# Patient Record
Sex: Female | Born: 1965 | Race: White | Hispanic: No | Marital: Married | Smoking: Former smoker
Health system: Southern US, Community
[De-identification: ages and names within clinical notes are randomized; demographics above are authoritative.]

## PROBLEM LIST (undated history)

## (undated) DIAGNOSIS — G8929 Other chronic pain: Secondary | ICD-10-CM

## (undated) DIAGNOSIS — I499 Cardiac arrhythmia, unspecified: Secondary | ICD-10-CM

## (undated) DIAGNOSIS — M1990 Unspecified osteoarthritis, unspecified site: Secondary | ICD-10-CM

## (undated) DIAGNOSIS — Z8489 Family history of other specified conditions: Secondary | ICD-10-CM

## (undated) DIAGNOSIS — F329 Major depressive disorder, single episode, unspecified: Secondary | ICD-10-CM

## (undated) DIAGNOSIS — I428 Other cardiomyopathies: Secondary | ICD-10-CM

## (undated) DIAGNOSIS — M549 Dorsalgia, unspecified: Secondary | ICD-10-CM

## (undated) DIAGNOSIS — E785 Hyperlipidemia, unspecified: Secondary | ICD-10-CM

## (undated) DIAGNOSIS — M542 Cervicalgia: Secondary | ICD-10-CM

## (undated) DIAGNOSIS — C439 Malignant melanoma of skin, unspecified: Secondary | ICD-10-CM

## (undated) DIAGNOSIS — M7750 Other enthesopathy of unspecified foot: Secondary | ICD-10-CM

## (undated) DIAGNOSIS — J302 Other seasonal allergic rhinitis: Secondary | ICD-10-CM

## (undated) DIAGNOSIS — F32A Depression, unspecified: Secondary | ICD-10-CM

## (undated) DIAGNOSIS — F419 Anxiety disorder, unspecified: Secondary | ICD-10-CM

## (undated) DIAGNOSIS — M797 Fibromyalgia: Secondary | ICD-10-CM

## (undated) DIAGNOSIS — I10 Essential (primary) hypertension: Secondary | ICD-10-CM

## (undated) DIAGNOSIS — D649 Anemia, unspecified: Secondary | ICD-10-CM

## (undated) DIAGNOSIS — N200 Calculus of kidney: Secondary | ICD-10-CM

## (undated) DIAGNOSIS — Z87442 Personal history of urinary calculi: Secondary | ICD-10-CM

## (undated) DIAGNOSIS — M722 Plantar fascial fibromatosis: Secondary | ICD-10-CM

## (undated) DIAGNOSIS — D472 Monoclonal gammopathy: Secondary | ICD-10-CM

## (undated) HISTORY — PX: DILATION AND CURETTAGE OF UTERUS: SHX78

## (undated) HISTORY — PX: WISDOM TOOTH EXTRACTION: SHX21

## (undated) HISTORY — DX: Plantar fascial fibromatosis: M72.2

## (undated) HISTORY — DX: Essential (primary) hypertension: I10

## (undated) HISTORY — DX: Malignant melanoma of skin, unspecified: C43.9

## (undated) HISTORY — DX: Anxiety disorder, unspecified: F41.9

## (undated) HISTORY — PX: TONSILLECTOMY: SUR1361

## (undated) HISTORY — DX: Calculus of kidney: N20.0

## (undated) HISTORY — DX: Other enthesopathy of unspecified foot and ankle: M77.50

## (undated) HISTORY — PX: COLONOSCOPY: SHX174

## (undated) HISTORY — PX: EYE SURGERY: SHX253

## (undated) HISTORY — DX: Cervicalgia: M54.2

## (undated) HISTORY — PX: ABDOMINAL HYSTERECTOMY: SHX81

## (undated) HISTORY — PX: NECK SURGERY: SHX720

## (undated) HISTORY — DX: Dorsalgia, unspecified: M54.9

## (undated) HISTORY — PX: RHINOPLASTY: SUR1284

## (undated) HISTORY — PX: TOTAL VAGINAL HYSTERECTOMY: SHX2548

## (undated) HISTORY — DX: Other chronic pain: G89.29

## (undated) HISTORY — DX: Other cardiomyopathies: I42.8

## (undated) HISTORY — PX: BACK SURGERY: SHX140

## (undated) HISTORY — PX: LITHOTRIPSY: SUR834

---

## 1998-09-24 ENCOUNTER — Other Ambulatory Visit: Admission: RE | Admit: 1998-09-24 | Discharge: 1998-09-24 | Payer: Self-pay | Admitting: Internal Medicine

## 1998-12-15 ENCOUNTER — Encounter: Admission: RE | Admit: 1998-12-15 | Discharge: 1999-02-03 | Payer: Self-pay | Admitting: Neurology

## 2000-08-31 HISTORY — PX: ENDOMETRIAL ABLATION: SHX621

## 2001-09-19 ENCOUNTER — Encounter: Admission: RE | Admit: 2001-09-19 | Discharge: 2001-09-19 | Payer: Self-pay | Admitting: Internal Medicine

## 2001-09-19 ENCOUNTER — Encounter: Payer: Self-pay | Admitting: Internal Medicine

## 2002-05-19 ENCOUNTER — Encounter (INDEPENDENT_AMBULATORY_CARE_PROVIDER_SITE_OTHER): Payer: Self-pay | Admitting: Specialist

## 2002-05-19 ENCOUNTER — Ambulatory Visit (HOSPITAL_COMMUNITY): Admission: RE | Admit: 2002-05-19 | Discharge: 2002-05-19 | Payer: Self-pay | Admitting: Obstetrics and Gynecology

## 2004-01-18 ENCOUNTER — Emergency Department (HOSPITAL_COMMUNITY): Admission: EM | Admit: 2004-01-18 | Discharge: 2004-01-18 | Payer: Self-pay | Admitting: Emergency Medicine

## 2004-07-19 ENCOUNTER — Other Ambulatory Visit: Admission: RE | Admit: 2004-07-19 | Discharge: 2004-07-19 | Payer: Self-pay | Admitting: Internal Medicine

## 2006-03-10 ENCOUNTER — Emergency Department (HOSPITAL_COMMUNITY): Admission: EM | Admit: 2006-03-10 | Discharge: 2006-03-10 | Payer: Self-pay | Admitting: Family Medicine

## 2007-01-28 ENCOUNTER — Other Ambulatory Visit: Admission: RE | Admit: 2007-01-28 | Discharge: 2007-01-28 | Payer: Self-pay | Admitting: Internal Medicine

## 2007-02-04 ENCOUNTER — Encounter: Admission: RE | Admit: 2007-02-04 | Discharge: 2007-02-04 | Payer: Self-pay | Admitting: Internal Medicine

## 2007-05-04 ENCOUNTER — Emergency Department (HOSPITAL_COMMUNITY): Admission: EM | Admit: 2007-05-04 | Discharge: 2007-05-04 | Payer: Self-pay | Admitting: Emergency Medicine

## 2007-11-10 ENCOUNTER — Encounter: Admission: RE | Admit: 2007-11-10 | Discharge: 2007-11-10 | Payer: Self-pay | Admitting: Internal Medicine

## 2007-12-16 ENCOUNTER — Other Ambulatory Visit: Admission: RE | Admit: 2007-12-16 | Discharge: 2007-12-16 | Payer: Self-pay | Admitting: Obstetrics and Gynecology

## 2007-12-20 ENCOUNTER — Ambulatory Visit (HOSPITAL_COMMUNITY): Admission: RE | Admit: 2007-12-20 | Discharge: 2007-12-21 | Payer: Self-pay | Admitting: Neurosurgery

## 2008-02-14 ENCOUNTER — Encounter: Admission: RE | Admit: 2008-02-14 | Discharge: 2008-02-14 | Payer: Self-pay | Admitting: Internal Medicine

## 2008-07-26 ENCOUNTER — Encounter: Admission: RE | Admit: 2008-07-26 | Discharge: 2008-07-26 | Payer: Self-pay | Admitting: Neurosurgery

## 2008-11-27 ENCOUNTER — Ambulatory Visit (HOSPITAL_COMMUNITY): Admission: RE | Admit: 2008-11-27 | Discharge: 2008-11-28 | Payer: Self-pay | Admitting: Neurosurgery

## 2009-02-16 ENCOUNTER — Encounter: Admission: RE | Admit: 2009-02-16 | Discharge: 2009-02-16 | Payer: Self-pay | Admitting: Internal Medicine

## 2009-06-11 ENCOUNTER — Encounter: Admission: RE | Admit: 2009-06-11 | Discharge: 2009-06-11 | Payer: Self-pay | Admitting: Neurosurgery

## 2009-07-03 HISTORY — PX: KIDNEY STONE SURGERY: SHX686

## 2009-09-16 ENCOUNTER — Encounter: Admission: RE | Admit: 2009-09-16 | Discharge: 2009-09-16 | Payer: Self-pay | Admitting: Neurosurgery

## 2009-09-17 ENCOUNTER — Ambulatory Visit (HOSPITAL_COMMUNITY): Admission: AD | Admit: 2009-09-17 | Discharge: 2009-09-17 | Payer: Self-pay | Admitting: Urology

## 2009-09-23 ENCOUNTER — Ambulatory Visit (HOSPITAL_COMMUNITY): Admission: RE | Admit: 2009-09-23 | Discharge: 2009-09-23 | Payer: Self-pay | Admitting: Urology

## 2009-09-30 ENCOUNTER — Ambulatory Visit (HOSPITAL_BASED_OUTPATIENT_CLINIC_OR_DEPARTMENT_OTHER): Admission: RE | Admit: 2009-09-30 | Discharge: 2009-09-30 | Payer: Self-pay | Admitting: Urology

## 2009-12-16 ENCOUNTER — Inpatient Hospital Stay (HOSPITAL_COMMUNITY): Admission: RE | Admit: 2009-12-16 | Discharge: 2009-12-20 | Payer: Self-pay | Admitting: Neurosurgery

## 2010-03-22 ENCOUNTER — Observation Stay (HOSPITAL_COMMUNITY): Admission: EM | Admit: 2010-03-22 | Discharge: 2010-03-23 | Payer: Self-pay | Admitting: Emergency Medicine

## 2010-04-14 ENCOUNTER — Encounter: Admission: RE | Admit: 2010-04-14 | Discharge: 2010-04-14 | Payer: Self-pay | Admitting: Internal Medicine

## 2010-04-14 LAB — HM MAMMOGRAPHY: HM Mammogram: NORMAL

## 2010-09-15 LAB — POCT I-STAT, CHEM 8
BUN: 9 mg/dL (ref 6–23)
Calcium, Ion: 1.08 mmol/L — ABNORMAL LOW (ref 1.12–1.32)
Chloride: 106 mEq/L (ref 96–112)
Creatinine, Ser: 0.7 mg/dL (ref 0.4–1.2)
Glucose, Bld: 92 mg/dL (ref 70–99)
HCT: 39 % (ref 36.0–46.0)
Hemoglobin: 13.3 g/dL (ref 12.0–15.0)
Potassium: 4.1 mEq/L (ref 3.5–5.1)
Sodium: 137 mEq/L (ref 135–145)
TCO2: 21 mmol/L (ref 0–100)

## 2010-09-15 LAB — CBC
HCT: 35.8 % — ABNORMAL LOW (ref 36.0–46.0)
HCT: 36.2 % (ref 36.0–46.0)
Hemoglobin: 12.1 g/dL (ref 12.0–15.0)
Hemoglobin: 12.3 g/dL (ref 12.0–15.0)
MCH: 30.2 pg (ref 26.0–34.0)
MCH: 30.7 pg (ref 26.0–34.0)
MCHC: 33.4 g/dL (ref 30.0–36.0)
MCHC: 34.4 g/dL (ref 30.0–36.0)
MCV: 89.3 fL (ref 78.0–100.0)
MCV: 90.3 fL (ref 78.0–100.0)
Platelets: 346 10*3/uL (ref 150–400)
Platelets: 357 10*3/uL (ref 150–400)
RBC: 4.01 MIL/uL (ref 3.87–5.11)
RBC: 4.01 MIL/uL (ref 3.87–5.11)
RDW: 13.9 % (ref 11.5–15.5)
RDW: 13.9 % (ref 11.5–15.5)
WBC: 11 10*3/uL — ABNORMAL HIGH (ref 4.0–10.5)
WBC: 8.6 10*3/uL (ref 4.0–10.5)

## 2010-09-15 LAB — DIFFERENTIAL
Basophils Absolute: 0.1 10*3/uL (ref 0.0–0.1)
Basophils Relative: 1 % (ref 0–1)
Eosinophils Absolute: 0.2 10*3/uL (ref 0.0–0.7)
Eosinophils Relative: 2 % (ref 0–5)
Lymphocytes Relative: 30 % (ref 12–46)
Lymphs Abs: 3.3 10*3/uL (ref 0.7–4.0)
Monocytes Absolute: 0.7 10*3/uL (ref 0.1–1.0)
Monocytes Relative: 7 % (ref 3–12)
Neutro Abs: 6.8 10*3/uL (ref 1.7–7.7)
Neutrophils Relative %: 62 % (ref 43–77)

## 2010-09-15 LAB — BASIC METABOLIC PANEL
BUN: 6 mg/dL (ref 6–23)
CO2: 23 mEq/L (ref 19–32)
Calcium: 8.9 mg/dL (ref 8.4–10.5)
Chloride: 106 mEq/L (ref 96–112)
Creatinine, Ser: 0.7 mg/dL (ref 0.4–1.2)
GFR calc Af Amer: >60 mL/min (ref >60)
GFR calc non Af Amer: >60 mL/min (ref >60)
Glucose, Bld: 99 mg/dL (ref 70–99)
Potassium: 3.9 mEq/L (ref 3.5–5.1)
Sodium: 135 mEq/L (ref 135–145)

## 2010-09-15 LAB — CARDIAC PANEL(CRET KIN+CKTOT+MB+TROPI)
CK, MB: 2.3 ng/mL (ref 0.3–4.0)
CK, MB: 3.3 ng/mL (ref 0.3–4.0)
Relative Index: INVALID (ref 0.0–2.5)
Relative Index: INVALID (ref 0.0–2.5)
Total CK: 70 U/L (ref 7–177)
Total CK: 86 U/L (ref 7–177)
Troponin I: 0.01 ng/mL (ref 0.00–0.06)
Troponin I: <0.01 ng/mL (ref 0.00–0.06)

## 2010-09-15 LAB — APTT: aPTT: 25 seconds (ref 24–37)

## 2010-09-15 LAB — PROTIME-INR
INR: 1.03 (ref 0.00–1.49)
Prothrombin Time: 13.7 seconds (ref 11.6–15.2)

## 2010-09-15 LAB — POCT CARDIAC MARKERS
CKMB, poc: 2.1 ng/mL (ref 1.0–8.0)
Myoglobin, poc: 55.7 ng/mL (ref 12–200)
Troponin i, poc: <0.05 ng/mL (ref 0.00–0.09)

## 2010-09-15 LAB — CK TOTAL AND CKMB (NOT AT ARMC)
CK, MB: 3.5 ng/mL (ref 0.3–4.0)
Relative Index: INVALID (ref 0.0–2.5)
Total CK: 89 U/L (ref 7–177)

## 2010-09-15 LAB — TROPONIN I: Troponin I: 0.01 ng/mL (ref 0.00–0.06)

## 2010-09-19 LAB — CROSSMATCH
ABO/RH(D): O POS
Antibody Screen: NEGATIVE

## 2010-09-19 LAB — CBC
HCT: 37.8 % (ref 36.0–46.0)
Hemoglobin: 12.9 g/dL (ref 12.0–15.0)
MCHC: 34.1 g/dL (ref 30.0–36.0)
MCV: 93.6 fL (ref 78.0–100.0)
Platelets: 339 10*3/uL (ref 150–400)
RBC: 4.03 MIL/uL (ref 3.87–5.11)
RDW: 12.5 % (ref 11.5–15.5)
WBC: 8.6 10*3/uL (ref 4.0–10.5)

## 2010-09-19 LAB — COMPREHENSIVE METABOLIC PANEL
ALT: 21 U/L (ref 0–35)
AST: 19 U/L (ref 0–37)
Albumin: 4.3 g/dL (ref 3.5–5.2)
Alkaline Phosphatase: 79 U/L (ref 39–117)
BUN: 8 mg/dL (ref 6–23)
CO2: 29 mEq/L (ref 19–32)
Calcium: 9.6 mg/dL (ref 8.4–10.5)
Chloride: 105 mEq/L (ref 96–112)
Creatinine, Ser: 0.66 mg/dL (ref 0.4–1.2)
GFR calc Af Amer: >60 mL/min (ref >60)
GFR calc non Af Amer: >60 mL/min (ref >60)
Glucose, Bld: 78 mg/dL (ref 70–99)
Potassium: 3.5 mEq/L (ref 3.5–5.1)
Sodium: 139 mEq/L (ref 135–145)
Total Bilirubin: 0.5 mg/dL (ref 0.3–1.2)
Total Protein: 7.1 g/dL (ref 6.0–8.3)

## 2010-09-19 LAB — SURGICAL PCR SCREEN
MRSA, PCR: NEGATIVE
Staphylococcus aureus: NEGATIVE

## 2010-09-26 LAB — POCT I-STAT, CHEM 8
BUN: 9 mg/dL (ref 6–23)
Calcium, Ion: 1.27 mmol/L (ref 1.12–1.32)
Chloride: 103 mEq/L (ref 96–112)
Creatinine, Ser: 0.6 mg/dL (ref 0.4–1.2)
Glucose, Bld: 110 mg/dL — ABNORMAL HIGH (ref 70–99)
HCT: 35 % — ABNORMAL LOW (ref 36.0–46.0)
Hemoglobin: 11.9 g/dL — ABNORMAL LOW (ref 12.0–15.0)
Potassium: 3.9 mEq/L (ref 3.5–5.1)
Sodium: 140 mEq/L (ref 135–145)
TCO2: 28 mmol/L (ref 0–100)

## 2010-09-26 LAB — BASIC METABOLIC PANEL
BUN: 5 mg/dL — ABNORMAL LOW (ref 6–23)
CO2: 24 mEq/L (ref 19–32)
Calcium: 9.2 mg/dL (ref 8.4–10.5)
Chloride: 106 mEq/L (ref 96–112)
Creatinine, Ser: 0.65 mg/dL (ref 0.4–1.2)
GFR calc Af Amer: >60 mL/min (ref >60)
GFR calc non Af Amer: >60 mL/min (ref >60)
Glucose, Bld: 101 mg/dL — ABNORMAL HIGH (ref 70–99)
Potassium: 3.9 mEq/L (ref 3.5–5.1)
Sodium: 139 mEq/L (ref 135–145)

## 2010-09-26 LAB — CBC
HCT: 38.2 % (ref 36.0–46.0)
Hemoglobin: 13.1 g/dL (ref 12.0–15.0)
MCHC: 34.3 g/dL (ref 30.0–36.0)
MCV: 93.7 fL (ref 78.0–100.0)
Platelets: 358 10*3/uL (ref 150–400)
RBC: 4.07 MIL/uL (ref 3.87–5.11)
RDW: 13.1 % (ref 11.5–15.5)
WBC: 8.7 10*3/uL (ref 4.0–10.5)

## 2010-09-26 LAB — PREGNANCY, URINE: Preg Test, Ur: NEGATIVE

## 2010-09-26 LAB — POCT PREGNANCY, URINE: Preg Test, Ur: NEGATIVE

## 2010-10-11 LAB — CBC
HCT: 38 % (ref 36.0–46.0)
Hemoglobin: 13 g/dL (ref 12.0–15.0)
MCHC: 34.3 g/dL (ref 30.0–36.0)
MCV: 94.8 fL (ref 78.0–100.0)
Platelets: 448 10*3/uL — ABNORMAL HIGH (ref 150–400)
RBC: 4 MIL/uL (ref 3.87–5.11)
RDW: 13.2 % (ref 11.5–15.5)
WBC: 10.5 10*3/uL (ref 4.0–10.5)

## 2010-10-11 LAB — BASIC METABOLIC PANEL
BUN: 7 mg/dL (ref 6–23)
CO2: 28 mEq/L (ref 19–32)
Calcium: 10.2 mg/dL (ref 8.4–10.5)
Chloride: 103 mEq/L (ref 96–112)
Creatinine, Ser: 0.71 mg/dL (ref 0.4–1.2)
GFR calc Af Amer: >60 mL/min (ref >60)
GFR calc non Af Amer: >60 mL/min (ref >60)
Glucose, Bld: 99 mg/dL (ref 70–99)
Potassium: 4 mEq/L (ref 3.5–5.1)
Sodium: 138 mEq/L (ref 135–145)

## 2010-10-20 ENCOUNTER — Institutional Professional Consult (permissible substitution): Payer: Self-pay | Admitting: Family Medicine

## 2010-10-20 ENCOUNTER — Institutional Professional Consult (permissible substitution) (INDEPENDENT_AMBULATORY_CARE_PROVIDER_SITE_OTHER): Payer: PRIVATE HEALTH INSURANCE | Admitting: Family Medicine

## 2010-10-20 DIAGNOSIS — R5381 Other malaise: Secondary | ICD-10-CM

## 2010-10-20 DIAGNOSIS — R5383 Other fatigue: Secondary | ICD-10-CM

## 2010-10-20 DIAGNOSIS — I10 Essential (primary) hypertension: Secondary | ICD-10-CM

## 2010-10-20 DIAGNOSIS — Z79899 Other long term (current) drug therapy: Secondary | ICD-10-CM

## 2010-11-15 NOTE — Op Note (Signed)
[name], Rodriguez            ACCOUNT NO.:  [id number]   MEDICAL RECORD NO.:  [id number]          PATIENT TYPE:  OIB   LOCATION:  3535                         FACILITY:  [facility]   PHYSICIAN:  Danae Orleans. Venetia Maxon, M.D.  DATE OF BIRTH:  [birth year]   DATE OF PROCEDURE:  11/27/2008  DATE OF DISCHARGE:                               OPERATIVE REPORT   PREOPERATIVE DIAGNOSES:  Herniated cervical disk with spondylosis,  degenerative disk disease, and radiculopathy, C4-5 level.   POSTOPERATIVE DIAGNOSES:  Herniated cervical disk with spondylosis,  degenerative disk disease, and radiculopathy, C4-5 level.   PROCEDURE:  Anterior cervical decompression and fusion C4-5 with  allograft bone graft, morcellized bone autograft, and intracervical  plate.   SURGEON:  Danae Orleans. Venetia Maxon, MD   ASSISTANT:  Dr. Lovell Sheehan.   ANESTHESIA:  General endotracheal anesthesia.   ESTIMATED BLOOD LOSS:  Minimal.   COMPLICATIONS:  None.   DISPOSITION:  Recovery.   INDICATIONS:  Lindsey Rodriguez is a 45 year old woman, who previously  undergone multilevel cervical decompression and fusion.  She has now  developed a herniated disk at C4-5 causing significant right greater  than left upper extremity pain and weakness, and it was elected to take  her to surgery for anterior decompression and fusion at the C4-5 level.   DESCRIPTION OF PROCEDURE:  Lindsey Rodriguez was brought to the operating  room.  Following satisfactory and uncomplicated induction of general  endotracheal anesthesia and placement of intravenous lines, the patient  was placed in supine position on the operating table.  Her neck was  placed in slight extension.  She was placed in 5 pounds Halter traction.  Her anterior neck was then prepped and draped in usual sterile fashion.  The area of planned incision was infiltrated with local lidocaine.  Incision was made in a transverse neck crease at the level of C4-5 and  carried sharply through the platysma  layer.  Subplatysmal dissection was  performed exposing anterior border of sternocleidomastoid muscle.  Blunt  dissection was performed, carotid sheath was kept lateral, and trachea  and esophagus kept medial.  There was a considerable amount of scarring  overlying the previously placed plate and this was very carefully  dissected to expose bone spur, which was then removed.  The investing  soft tissues were cleared off the C4 and C5 levels with a key elevator  and self-retaining Shadow-Line retractor was placed to facilitate  exposure.  Interspace at C4-5 was then incised and disk material was  removed in piecemeal fashion.  Disk space spreaders were placed and the  microscope was brought into field.  Endplates were decorticated with  high-speed drill and uncinate spurs were drilled down, bone removed,  these maneuvers were saved for later use with bone grafting.  Subsequently, under microscopic visualization, the posterior  longitudinal ligament was removed and the spinal cord dura was  decompressed as were both C5 nerve roots.  There was a significant  amount of herniated disk material laterally overlying the C5 nerve root  on the right and this was decompressed.  Both neural foramina were felt  to be well decompressed.  Hemostasis was assured with Gelfoam soaked in  thrombin.  After trial sizing, a 6-mm allograft bone wedge was selected,  fashioned with high-speed drill, packed with morcellized bone autograft,  inserted in the interspace and countersunk appropriately.  Traction  weight was removed.  A 12-mm Trestle plate was then affixed to the  anterior cervical spine using variable angle screws, 2 at C4 and 2 at  C5.  The screws were angled and the plate was nestled within the  confines of the previous plate and screws were directed so as to avoid  the previous screws.  I had excellent bone purchase and all screws were  placed and locked down in situ with good purchase, and locking   mechanisms were engaged.  Final x-ray demonstrated well-positioned  interbody graft and anterior cervical plate.  Wound was irrigated.  Soft  tissues were inspected and found to be in good repair.  The platysma  layer was closed with 3-0 Vicryl sutures.  Skin edges were approximated  with 3-0 Vicryl subcuticular stitch.  Wound was dressed with Dermabond.  The patient was extubated in the operating room and taken to the  recovery room in stable satisfactory condition having tolerated the  operation well.  Counts were correct at the end of the case.      Danae Orleans. Venetia Maxon, M.D.  Electronically Signed     JDS/MEDQ  D:  11/27/2008  T:  11/28/2008  Job:  [id number]

## 2010-11-15 NOTE — Op Note (Signed)
NAMESENG, FOUTS            ACCOUNT NO.:  [id number]   MEDICAL RECORD NO.:  [id number]          PATIENT TYPE:  OIB   LOCATION:  3528                         FACILITY:  [facility]   PHYSICIAN:  Danae Orleans. Venetia Maxon, M.D.  DATE OF BIRTH:  [birth year]   DATE OF PROCEDURE:  DATE OF DISCHARGE:                               OPERATIVE REPORT   PREOPERATIVE DIAGNOSIS:  Herniated cervical disk with spondylosis,  degenerative disk disease, and radiculopathy C5-C6, C6-C7, and C7-T1  levels.   POSTOPERATIVE DIAGNOSIS:  Herniated cervical disk with spondylosis,  degenerative disk disease, and radiculopathy C5-C6, C6-C7, and C7-T1  levels.   PROCEDURE:  Anterior cervical decompression and fusion C5-C6, C6-C7, and  C7-T1 with allograft bone wedge, morselized bone autograft, FortrOss,  and anterior cervical plate.   SURGEON:  Danae Orleans. Venetia Maxon, MD   ASSISTANT:  Georgiann Cocker, RN   ANESTHESIA:  General endotracheal anesthesia.   BLOOD LOSS:  Minimal.   COMPLICATIONS:  None.   DISPOSITION:  Recovery.   INDICATIONS:  Lindsey Rodriguez is a 45 year old woman with severe right  arm pain.  She has weakness involving her hand intrinsics, has a lateral  disk herniation C7-T1, has spondylitic foraminal stenosis at C6-C7, has  had previous posterior diskectomy at C6-C7 on the right, and has a  fairly large broad-based disk herniation at C5-C6 on the left.  She also  has left arm pain.  It was elected to take her to surgery for anterior  cervical decompression and fusion at these affected levels.   PROCEDURE:  Lindsey Rodriguez was brought to the operating room.  Following  satisfactory and uncomplicated induction of general endotracheal  anesthesia and placement of intravenous lines, the patient was placed in  supine position on the operating table.  The neck was placed in slight  extension.  She was placed in 5 pounds of halter traction.  Her anterior  neck was then prepped and draped in the usual sterile  fashion.  Planned  incision was infiltrated with 0.25% Marcaine and 0.5% lidocaine with  1:200,000 epinephrine.  An incision was made from the midline to the  anterior border of the sternocleidomastoid muscle which was carried  sharply through platysmal layer.  Subplatysmal dissection was performed  exposing the anterior border of the sternocleidomastoid muscle using  blunt dissection.  The carotid sheath was kept lateral to the trachea  and esophagus, kept medial exposing the anterior cervical spine.  A bent  spinal needle was placed in what was felt to be the C4-C5 level, and  this was confirmed on intraoperative x-ray.  Subsequently, exposure was  carried caudad exposing the C5-C6, C6-C7, and C7-T1 levels.  Electrocautery and key elevator were used to facilitate exposure and  shadow line self-retaining retractor was placed along with up and down  retractor.  Distraction pins were placed at C7-T1 after each of the  interspaces was incised with a 15 blade and disk material was removed in  a piecemeal fashion using a variety of Carlens curettes and pituitary  rongeurs.  The microscope was brought into the field.  Under microscopic  visualization, the endplates were decorticated  with a high-speed drill  and uncinate spurs were drilled down.  There was a large amount of  laterally based disk herniation at C7-T1 on the right and the C8 nerve  root was significantly decompressed as was the neural foramen, spinal  cord dura, and left-sided foramen were also decompressed.  Hemostasis  was assured with Gelfoam-soaked thrombin, and after trial sizing a 6-mm  bone allograft wedge was selected, fashion with high-speed drill, packed  with morselized bone autograft FortrOss, reconstituted with the  patient's own blood, inserted the interspace and countersunk  appropriately.  Attention was then turned to the C6-C7 level where a  similar decompression was performed.  The endplates were decorticated.   Large ventral osteophytes were removed with Leksell rongeur and there  was scarring around the C7 nerve root in the right.  This was carefully  decompressed, and disk material and spondylitic material were removed.  Spinal cord dura and left C7 nerve were also decompressed.  A similarly  sized bone wedge was selected and fashion with a high-speed drill, pack  with morcellized bone autograft and FortrOss inserted in the interspace  and countersunk appropriately.  At C5-C6, distraction pins were removed  to this level and endplates were decorticated.  The uncinate spurs were  removed and a large paracentral left-sided disk herniation was removed  with decompression of the spinal cord dura.  Both the C6 nerve roots  were decompressed and extended out through the neural foramina.  Hemostasis was assured, and after trial sizing an 7-mm allograft wedge  was selected, fashioned with high-speed drill, packed with FortrOss and  autograft, inserted in the interspace and countersunk appropriately.  A  48-mm Trestle anterior cervical plate was then affixed to the anterior  cervical spine using variable angle 14-mm screws, two at C5, two at C6,  two at C7, and two at T1.  All screws had excellent purchase.  The  locking mechanisms were engaged.  Final x-ray demonstrated well-  positioned upper aspect of the anterior cervical plate.  The wound was  irrigated.  Soft tissues were inspected and found to be in good repair.  Hemostasis was assured.  Platysmal layer was closed with 3-0 Vicryl  sutures.  Skin edges were approximated with 3-0 Vicryl interrupted  inverted sutures.  The wound was dressed with Dermabond.  The patient  was extubated in the operating room and taken to recovery room in stable  and satisfactory condition and he tolerated operation well.  Counts were  correct at the end of the case.      Danae Orleans. Venetia Maxon, M.D.  Electronically Signed     JDS/MEDQ  D:  12/20/2007  T:  12/21/2007   Job:  [id number]

## 2010-11-18 NOTE — Op Note (Signed)
   NAMEVIVIENE, Lindsey Rodriguez                      ACCOUNT NO.:  [id number]   MEDICAL RECORD NO.:  [id number]                   PATIENT TYPE:  AMB   LOCATION:  SDC                                  FACILITY:  [facility]   PHYSICIAN:  Malva Limes, M.D.                 DATE OF BIRTH:  06/20/1966   DATE OF PROCEDURE:  05/19/2002  DATE OF DISCHARGE:                                 OPERATIVE REPORT   PREOPERATIVE DIAGNOSIS:  Menorrhagia.   POSTOPERATIVE DIAGNOSIS:  Menorrhagia.   PROCEDURE:  1. Hysteroscopy.  2. Dilatation and curettage.  3. Cryoablation of endometrium.   SURGEON:  Malva Limes, M.D.   ANESTHESIA:  General.   ANTIBIOTICS:  Cleocin 900 mg.   ESTIMATED BLOOD LOSS:  20 cc.   COMPLICATIONS:  None.   SPECIMENS:  Endometrial curettings sent to pathology.   DRAINS:  Red rubber catheter to bladder.   DESCRIPTION OF PROCEDURE:  The patient was taken to the operating room where  she was placed in the dorsal supine position.  A general anesthetic was  administered without complications.  She was then placed in the dorsal  lithotomy position and prepped with Hibiclens.  Her bladder was drained with  a red rubber catheter.  She was then draped in the usual fashion for this  procedure.  A sterile speculum was placed in the vagina.  A single-tooth  tenaculum was applied to the anterior cervical lip.  The cervix was then  serially dilated to a 16 [place].  The sounding probe and measured to 10 cm.  The hysteroscope was advanced and both ostial were easily visualized.  There  was no evidence of any polyps or fibroids.  The endometrial lining was  rather thickened though.  At this point, the hysteroscope was removed, and a  sharp curettage was performed.  This followed by setting up the cryoablation  machine.  The probe was then placed in the right cornua and turned on for 7  minutes.  Once this was completed, the left cornua was also treated at 7  minutes.  Following this, the  lower uterine segment was treated at four  minutes.  The patient tolerated the procedure well.  She was taken to the  recovery room in stable condition.  She will be discharged to home.  She  will be instructed to follow up in the office in four weeks.  She was told  to take Advil as needed.                                              Malva Limes, M.D.   MA/MEDQ  D:  05/19/2002  T:  05/19/2002  Job:  [id number]

## 2010-12-14 ENCOUNTER — Encounter: Payer: Self-pay | Admitting: *Deleted

## 2010-12-22 ENCOUNTER — Ambulatory Visit (INDEPENDENT_AMBULATORY_CARE_PROVIDER_SITE_OTHER): Payer: BC Managed Care – PPO | Admitting: Family Medicine

## 2010-12-22 ENCOUNTER — Encounter: Payer: Self-pay | Admitting: Family Medicine

## 2010-12-22 VITALS — BP 110/80 | HR 76 | Ht 66.0 in | Wt 199.0 lb

## 2010-12-22 DIAGNOSIS — G629 Polyneuropathy, unspecified: Secondary | ICD-10-CM

## 2010-12-22 DIAGNOSIS — R519 Headache, unspecified: Secondary | ICD-10-CM | POA: Insufficient documentation

## 2010-12-22 DIAGNOSIS — R51 Headache: Secondary | ICD-10-CM

## 2010-12-22 DIAGNOSIS — G589 Mononeuropathy, unspecified: Secondary | ICD-10-CM

## 2010-12-22 DIAGNOSIS — J329 Chronic sinusitis, unspecified: Secondary | ICD-10-CM

## 2010-12-22 MED ORDER — GABAPENTIN 800 MG PO TABS: 400.0000 mg | ORAL_TABLET | Freq: Three times a day (TID) | ORAL | Status: DC

## 2010-12-22 MED ORDER — NAPROXEN 500 MG PO TABS: 500.0000 mg | ORAL_TABLET | Freq: Two times a day (BID) | ORAL | Status: DC

## 2010-12-22 MED ORDER — AZITHROMYCIN 250 MG PO TABS: ORAL_TABLET | ORAL | Status: AC

## 2010-12-22 NOTE — Patient Instructions (Signed)
  Sinusitis--Mucinex, decongestant (either Mucinex-D, or a separate Sudafed), stop OTC tylenol product.  Continue claritin prn allergies.  Zpak.   If no better in 5-10 days, call for change in ABX (Levaquin).  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  Increase to 400mg  three times daily

## 2010-12-22 NOTE — Progress Notes (Signed)
Subjective:    Patient ID: Lindsey Rodriguez, female    DOB: 1966-01-24, 45 y.o.   MRN: [id number]  HPI Patient presents with multiple complaints.  She is accompanied by her husband  Sinus pain x 2 weeks, from both cheeks, up to her forehead, worse with bending forward.  Nasal drainage is slimy green.  Denies sore throat or cough, but does note PND.  Tried Tylenol Allergy Sinus with some help, temporarily.  Using Netipot with temporary relief  F/U headaches:  Headaches are constant, in the back of her head.  Using muscle relaxants (rx'd by Dr. Venetia Maxon) and Neurontin (400mg  BID), which seems to take the edge off some, but never completely resolves.  Dr. Venetia Maxon believes the neck is the etiology, and she has an appt with him Monday.  She has had multiple neck surgeries, and thinks she may need another one.  Had PT in the past, which wasn't helpful.  She did find that the Treximet did help some with her headaches.  Hasn't had a full blown migraine in about 3 weeks.  Neuropathy in her feet:  The neurontin helps, but has some decreased sensation and constant tingling in both feet.  Followed by Dr. Evelene Croon for depression, doing well.  Past Medical History  Diagnosis Date  . Anxiety panic attacks  . Hypertension   . Kidney stones h/o  . Chronic neck pain   . Chronic back pain   . Migraine     Past Surgical History  Procedure Date  . Neck surgery [name], last sx Summer 2011  . Back surgery Dr.Stern, last sx Summer 2011  . Tonsillectomy   . Rhinoplasty     History   Social History  . Marital Status: Married    Spouse Name: N/A    Number of Children: N/A  . Years of Education: N/A   Occupational History  . Not on file.   Social History Main Topics  . Smoking status: Former Smoker    Quit date: 07/04/1987  . Smokeless tobacco: Not on file  . Alcohol Use: Yes     1 drink per week  . Drug Use: No  . Sexually Active: Not on file   Other Topics Concern  . Not on file   Social  History Narrative  . No narrative on file    Family History  Problem Relation Age of Onset  . Hypertension Mother   . Depression Mother   . Migraines Mother   . COPD Father   . Heart failure Father   . Breast cancer Cousin     Current outpatient prescriptions:amLODipine-benazepril (LOTREL) 10-20 MG per capsule, Take 1 capsule by mouth daily.  , Disp: , Rfl: ;  ARIPiprazole (ABILIFY) 2 MG tablet, Take 2 mg by mouth daily.  , Disp: , Rfl: ;  buPROPion (WELLBUTRIN XL) 150 MG 24 hr tablet, Take 450 mg by mouth daily.  , Disp: , Rfl: ;  gabapentin (NEURONTIN) 800 MG tablet, Take 0.5 tablets (400 mg total) by mouth 3 (three) times daily., Disp: 90 tablet, Rfl: 1 loratadine (CLARITIN) 10 MG tablet, Take 10 mg by mouth daily.  , Disp: , Rfl: ;  Vilazodone HCl (VIIBRYD) 40 MG TABS, Take 1 tablet by mouth daily.  , Disp: , Rfl: ;  DISCONTD: gabapentin (NEURONTIN) 100 MG tablet, Take 400 mg by mouth as needed.  , Disp: , Rfl: ;  DISCONTD: gabapentin (NEURONTIN) 800 MG tablet, Take 400 mg by mouth 2 (two) times daily.  ,  Disp: , Rfl:  azithromycin (ZITHROMAX Z-PAK) 250 MG tablet, Take 2 tablets by mouth on day 1, followed by 1 tablet by mouth daily for 4 days. Take 2 tablets (500 mg) on  Day 1,  followed by 1 tablet (250 mg) once daily on Days 2 through 5.  Use refill to start on day 11 if not completely better, Disp: 6 each, Rfl: 1 naproxen (NAPROSYN) 500 MG tablet, Take 1 tablet (500 mg total) by mouth 2 (two) times daily with a meal. Take as needed for pain, Disp: 60 tablet, Rfl: 1;  tiZANidine (ZANAFLEX) 2 MG tablet, , Disp: , Rfl:   Allergies  Allergen Reactions  . Penicillins Shortness Of Breath and Rash  . Codeine Other (See Comments)    Manic depressive    Review of Systems Denies fevers, nausea/vomiting/diarrhea, no skin rash.  See HPI    Objective:   Physical Exam BP 110/80  Pulse 76  Ht 5\' 6"  (1.676 m)  Wt 199 lb (90.266 kg)  BMI 32.12 kg/m2  LMP 11/29/2010 Well developed female,  smiling, in no distress HEENT:  PERRL, EOMI, conjunctiva clear.  Nose--moderately edematous, no erythema, white mucus.  Tender over sinuses x 4 Neck--no significant lymphadenopathy.  Diffusely tender across neck and trapezius bilaterally Heart:  Regular rate and rhythm without murmurs Lungs:  Clear bilaterally Skin:  Without rashes Psych:  Normal mood, affect, hygiene and grooming       Assessment & Plan:      1. Sinusitis   2. Neuropathy   3. Headache    Neck pain--Naproxen 500mg  BID--may help with muscular pain, as well as prevent migraines.  Etiology appears to be related to her neck and she plans to f/u with Dr. Venetia Maxon (and will have him send records here). NSAID precautions reviewed.  Sinusitis--Mucinex, decongestant, stop OTC tylenol product.  Continue claritin prn allergies.  Zpak (last used in the fall).  If no better in 5-10 days, call for change in ABX (Levaquin).  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 Only on low dose neurontin.  Increase to 400mg  TID.  May gradually increase further, if needed and if tolerated

## 2010-12-26 ENCOUNTER — Other Ambulatory Visit (HOSPITAL_COMMUNITY): Payer: Self-pay | Admitting: Neurosurgery

## 2010-12-26 DIAGNOSIS — M5416 Radiculopathy, lumbar region: Secondary | ICD-10-CM

## 2010-12-26 DIAGNOSIS — M542 Cervicalgia: Secondary | ICD-10-CM

## 2011-01-06 ENCOUNTER — Other Ambulatory Visit (HOSPITAL_COMMUNITY): Payer: BC Managed Care – PPO

## 2011-01-16 ENCOUNTER — Other Ambulatory Visit: Payer: Self-pay | Admitting: Neurosurgery

## 2011-01-16 DIAGNOSIS — M542 Cervicalgia: Secondary | ICD-10-CM

## 2011-01-17 ENCOUNTER — Ambulatory Visit
Admission: RE | Admit: 2011-01-17 | Discharge: 2011-01-17 | Disposition: A | Discharge status: Home / Self Care | Payer: BC Managed Care – PPO | Source: Ambulatory Visit | Attending: Neurosurgery | Admitting: Neurosurgery

## 2011-01-17 DIAGNOSIS — M542 Cervicalgia: Secondary | ICD-10-CM

## 2011-02-06 ENCOUNTER — Encounter: Payer: Self-pay | Admitting: Family Medicine

## 2011-02-06 ENCOUNTER — Ambulatory Visit (INDEPENDENT_AMBULATORY_CARE_PROVIDER_SITE_OTHER): Payer: BC Managed Care – PPO | Admitting: Family Medicine

## 2011-02-06 VITALS — BP 122/70 | HR 106 | Temp 98.8°F

## 2011-02-06 DIAGNOSIS — J019 Acute sinusitis, unspecified: Secondary | ICD-10-CM

## 2011-02-06 DIAGNOSIS — J209 Acute bronchitis, unspecified: Secondary | ICD-10-CM

## 2011-02-06 MED ORDER — CLARITHROMYCIN 500 MG PO TABS: 500.0000 mg | ORAL_TABLET | Freq: Two times a day (BID) | ORAL | Status: AC

## 2011-02-06 NOTE — Patient Instructions (Addendum)
Take all the medication and if not total back to normal call me for more. Use NyQuil for nighttime coughing

## 2011-02-06 NOTE — Progress Notes (Signed)
  Subjective:    Patient ID: Lindsey Rodriguez, female    DOB: May 11, 1966, 45 y.o.   MRN: [id number]  HPI She has a week long history of started with nasal congestion and rhinorrhea but no fever, chills or sore throat. Approximately 3 days ago she developed worsening PND with a dry cough is now productive she is now also having some bilateral earache as well as some chest comfort. She does have seasonal allergies and she does not smoke. She continues on medications listed in the chart.   Review of Systems     Objective:   Physical Exam alert and in no distress. Tympanic membranes and canals are normal. Throat is clear. Tonsils are normal. Neck is supple without adenopathy or thyromegaly. Cardiac exam shows a regular sinus rhythm without murmurs or gallops. Lungs are clear to auscultation. Nasal mucosa is slightly reddish. Tender over frontal and maxillary sinuses.       Assessment & Plan:  Sinusitis. Bronchitis. I will treat her with Biaxin. Encouraged her to call me if not entirely better

## 2011-02-28 ENCOUNTER — Other Ambulatory Visit (HOSPITAL_COMMUNITY): Payer: Self-pay | Admitting: Neurosurgery

## 2011-02-28 ENCOUNTER — Ambulatory Visit (HOSPITAL_COMMUNITY)
Admission: RE | Admit: 2011-02-28 | Discharge: 2011-02-28 | Disposition: A | Discharge status: Home / Self Care | Payer: BC Managed Care – PPO | Source: Ambulatory Visit | Attending: Neurosurgery | Admitting: Neurosurgery

## 2011-02-28 ENCOUNTER — Encounter (HOSPITAL_COMMUNITY)
Admission: RE | Admit: 2011-02-28 | Discharge: 2011-02-28 | Disposition: A | Discharge status: Home / Self Care | Payer: BC Managed Care – PPO | Source: Ambulatory Visit | Attending: Neurosurgery | Admitting: Neurosurgery

## 2011-02-28 DIAGNOSIS — M542 Cervicalgia: Secondary | ICD-10-CM | POA: Insufficient documentation

## 2011-02-28 DIAGNOSIS — R05 Cough: Secondary | ICD-10-CM | POA: Insufficient documentation

## 2011-02-28 DIAGNOSIS — Z01818 Encounter for other preprocedural examination: Secondary | ICD-10-CM | POA: Insufficient documentation

## 2011-02-28 DIAGNOSIS — I10 Essential (primary) hypertension: Secondary | ICD-10-CM | POA: Insufficient documentation

## 2011-02-28 DIAGNOSIS — R059 Cough, unspecified: Secondary | ICD-10-CM | POA: Insufficient documentation

## 2011-02-28 DIAGNOSIS — Z01812 Encounter for preprocedural laboratory examination: Secondary | ICD-10-CM | POA: Insufficient documentation

## 2011-02-28 LAB — CBC
HCT: 37.3 % (ref 36.0–46.0)
Hemoglobin: 13 g/dL (ref 12.0–15.0)
MCH: 32.1 pg (ref 26.0–34.0)
MCHC: 34.9 g/dL (ref 30.0–36.0)
MCV: 92.1 fL (ref 78.0–100.0)
Platelets: 345 10*3/uL (ref 150–400)
RBC: 4.05 MIL/uL (ref 3.87–5.11)
RDW: 12.7 % (ref 11.5–15.5)
WBC: 7.6 10*3/uL (ref 4.0–10.5)

## 2011-02-28 LAB — BASIC METABOLIC PANEL
BUN: 11 mg/dL (ref 6–23)
CO2: 27 mEq/L (ref 19–32)
Calcium: 9.8 mg/dL (ref 8.4–10.5)
Chloride: 102 mEq/L (ref 96–112)
Creatinine, Ser: 0.9 mg/dL (ref 0.50–1.10)
GFR calc Af Amer: >60 mL/min (ref >60)
GFR calc non Af Amer: >60 mL/min (ref >60)
Glucose, Bld: 91 mg/dL (ref 70–99)
Potassium: 4.4 mEq/L (ref 3.5–5.1)
Sodium: 139 mEq/L (ref 135–145)

## 2011-02-28 LAB — SURGICAL PCR SCREEN
MRSA, PCR: NEGATIVE
Staphylococcus aureus: NEGATIVE

## 2011-02-28 LAB — HCG, SERUM, QUALITATIVE: Preg, Serum: NEGATIVE

## 2011-03-03 ENCOUNTER — Telehealth: Payer: Self-pay | Admitting: Family Medicine

## 2011-03-03 ENCOUNTER — Other Ambulatory Visit: Payer: Self-pay | Admitting: Medical

## 2011-03-03 MED ORDER — LEVOFLOXACIN 500 MG PO TABS: 500.0000 mg | ORAL_TABLET | Freq: Every day | ORAL | Status: AC

## 2011-03-03 NOTE — Telephone Encounter (Signed)
Pt informed. CM,LPN

## 2011-03-03 NOTE — Telephone Encounter (Signed)
I sent Levaquin, 1 tablet daily x 10 days to pharmacy listed in chart ([facility]).  Use this for sinus infection, hydrate well with water, OTC Mucinex DM.   RTC if not better by 1wk

## 2011-03-09 ENCOUNTER — Observation Stay (HOSPITAL_COMMUNITY): Payer: BC Managed Care – PPO

## 2011-03-09 ENCOUNTER — Observation Stay (HOSPITAL_COMMUNITY)
Admission: RE | Admit: 2011-03-09 | Discharge: 2011-03-10 | Disposition: A | Discharge status: Home / Self Care | Payer: BC Managed Care – PPO | Source: Ambulatory Visit | Attending: Neurosurgery | Admitting: Neurosurgery

## 2011-03-09 DIAGNOSIS — G43909 Migraine, unspecified, not intractable, without status migrainosus: Secondary | ICD-10-CM | POA: Insufficient documentation

## 2011-03-09 DIAGNOSIS — I10 Essential (primary) hypertension: Secondary | ICD-10-CM | POA: Insufficient documentation

## 2011-03-09 DIAGNOSIS — Z23 Encounter for immunization: Secondary | ICD-10-CM | POA: Insufficient documentation

## 2011-03-09 DIAGNOSIS — F411 Generalized anxiety disorder: Secondary | ICD-10-CM | POA: Insufficient documentation

## 2011-03-09 DIAGNOSIS — F329 Major depressive disorder, single episode, unspecified: Secondary | ICD-10-CM | POA: Insufficient documentation

## 2011-03-09 DIAGNOSIS — G609 Hereditary and idiopathic neuropathy, unspecified: Secondary | ICD-10-CM | POA: Insufficient documentation

## 2011-03-09 DIAGNOSIS — M129 Arthropathy, unspecified: Secondary | ICD-10-CM | POA: Insufficient documentation

## 2011-03-09 DIAGNOSIS — T84498A Other mechanical complication of other internal orthopedic devices, implants and grafts, initial encounter: Principal | ICD-10-CM | POA: Insufficient documentation

## 2011-03-09 DIAGNOSIS — M47812 Spondylosis without myelopathy or radiculopathy, cervical region: Secondary | ICD-10-CM | POA: Insufficient documentation

## 2011-03-09 DIAGNOSIS — F3289 Other specified depressive episodes: Secondary | ICD-10-CM | POA: Insufficient documentation

## 2011-03-09 DIAGNOSIS — Y832 Surgical operation with anastomosis, bypass or graft as the cause of abnormal reaction of the patient, or of later complication, without mention of misadventure at the time of the procedure: Secondary | ICD-10-CM | POA: Insufficient documentation

## 2011-03-16 NOTE — Op Note (Signed)
  NAMEGLADA, WICKSTROM            ACCOUNT NO.:  [id number]  MEDICAL RECORD NO.:  [id number]  LOCATION:  3599                         FACILITY:  [facility]  PHYSICIAN:  Danae Orleans. Venetia Maxon, M.D.  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  PROCEDURE:  Posterior cervical fusion C4-C5 levels with lateral mass screws, BMP and allograft.  SURGEON:  Danae Orleans. Venetia Maxon, MD  ASSISTANTS:  Georgiann Cocker, RN and Coletta Memos, MD  ANESTHESIA:  General endotracheal anesthesia.  ESTIMATED BLOOD LOSS:  Minimal.  COMPLICATIONS:  None.  DISPOSITION:  Recovery.  INDICATIONS:  Lanyiah Brix is a 45 year old woman who has previously undergone multilevel cervical surgeries.  She had a C4-5 disk herniation and underwent anterior cervical fusion.  She developed a pseudoarthrosis at this level with broken screws at C4 and significant neck pain.  It was elected to take her to surgery for posterior cervical fusion at this level.  PROCEDURE:  Ms. Gonterman was brought to the operating room.  Following a satisfactory and uncomplicated induction of general endotracheal anesthesia plus intravenous lines, the patient was placed in a prone position on chest rolls in three-pin head fixation and her neck was maintained in neutral alignment.  Her posterior neck was then shaved, prepped and draped in usual sterile fashion.  Her previous scar over the back of her neck was ellipsed out and exposure was carried to the lateral masses of C4-C5 bilaterally.  The correct level was identified on C-arm fluoroscopy.  Using 12-mm lateral mass screws, these were placed at C4 bilaterally and C5 bilaterally using standard landmarks and trajectory.  No evidence of any cutouts of the screws.   The facet joints were decorticated and the laminae were eburnated with high-speed drill. Extra small BMP was utilized along with 5 mL of Vitoss bone pack.  Rods were placed and locked down in situ.  Final radiograph demonstrated well- positioned posterior cervical hardware without complications.  The wound was then closed with 0 Vicryl sutures, subcutaneous tissues were reapproximated with 2-0 Vicryl interrupted and inverted sutures and skin edges were reapproximated with 3-0 Vicryl subcuticular stitch.  Wound was dressed with Benzoin, Steri- Strips, Telfa gauze and tape.  The patient was extubated in the operating room and taken to recovery in stable and satisfactory condition having tolerated the operation well.  Counts were correct at the end of the case.  Pins were removed without difficulty.     Danae Orleans. Venetia Maxon, M.D.     JDS/MEDQ  D:  03/09/2011  T:  03/09/2011  Job:  [id number]  Electronically Signed by Maeola Harman M.D. on 03/16/2011 02:47:24 PM

## 2011-03-23 ENCOUNTER — Ambulatory Visit: Payer: BC Managed Care – PPO | Admitting: Family Medicine

## 2011-03-30 LAB — CBC
HCT: 36.1
Hemoglobin: 12.5
MCHC: 34.7
MCV: 90.8
Platelets: 421 — ABNORMAL HIGH
RBC: 3.97
RDW: 12.9
WBC: 10.2

## 2011-03-30 LAB — BASIC METABOLIC PANEL
BUN: 7
CO2: 27
Calcium: 10
Chloride: 98
Creatinine, Ser: 0.66
GFR calc Af Amer: >60
GFR calc non Af Amer: >60
Glucose, Bld: 96
Potassium: 3.7
Sodium: 136

## 2011-03-30 LAB — ABO/RH: ABO/RH(D): O POS

## 2011-03-30 LAB — TYPE AND SCREEN
ABO/RH(D): O POS
Antibody Screen: NEGATIVE

## 2011-04-11 LAB — POCT URINALYSIS DIP (DEVICE)
Bilirubin Urine: NEGATIVE
Glucose, UA: NEGATIVE
Ketones, ur: NEGATIVE
Nitrite: NEGATIVE
Operator id: 247071
Protein, ur: NEGATIVE
Specific Gravity, Urine: <=1.005
Urobilinogen, UA: 0.2
pH: 6.5

## 2011-04-11 LAB — POCT PREGNANCY, URINE
Operator id: 247071
Preg Test, Ur: NEGATIVE

## 2011-04-17 ENCOUNTER — Encounter: Payer: Self-pay | Admitting: Family Medicine

## 2011-04-17 ENCOUNTER — Ambulatory Visit (INDEPENDENT_AMBULATORY_CARE_PROVIDER_SITE_OTHER): Payer: BC Managed Care – PPO | Admitting: Family Medicine

## 2011-04-17 VITALS — BP 108/78 | HR 80 | Ht 67.0 in | Wt 198.0 lb

## 2011-04-17 DIAGNOSIS — G589 Mononeuropathy, unspecified: Secondary | ICD-10-CM

## 2011-04-17 DIAGNOSIS — I10 Essential (primary) hypertension: Secondary | ICD-10-CM | POA: Insufficient documentation

## 2011-04-17 DIAGNOSIS — E559 Vitamin D deficiency, unspecified: Secondary | ICD-10-CM | POA: Insufficient documentation

## 2011-04-17 DIAGNOSIS — G629 Polyneuropathy, unspecified: Secondary | ICD-10-CM

## 2011-04-17 NOTE — Patient Instructions (Addendum)
Please start taking over-the-counter Vitamin D3 1000 IU daily. You may restart the neurontin (1/2 tablet at bedtime, gradually increasing up to three times daily if needed)  Return for fasting labs for cholesterol panel.  Try and follow a lowfat, low cholesterol diet

## 2011-04-17 NOTE — Progress Notes (Signed)
Patient presents for f/u on headaches and hypertension.  She had neck surgery in early September 2012 with Dr. Venetia Maxon.  Had additional "bolts" put in to help stabilize spine in area of previous surgery.  Only needs to wear her neck brace when in a vehicle now.  She stopped taking all of her pain medications about 3 weeks ago.  Pain is 2-3/10, and not requiring medication, usually only notices the pain when she gets tired.  Has not been having any migraines since her surgery.  She stopped the neurontin after her surgery.  Did fine initially, but now that she is off of her pain medications, is noticing the neuropathy more.  Had some problems swallowing the neurontin, but would like to restart lower dose (previously took 800mg  BID).  Hypertension follow-up:  Blood pressures elsewhere are 110-120/70-80.  Denies dizziness, headaches, chest pain.  Denies side effects of medications.  Review of chart--pre-op labs in end of August were normal (CBC, chem).  Last labs from me were 10/2010--slightly low Vitamin D level (29), other labs normal.  She hasn't been taking any vitamin D supplements recently. April blood specimen was noted to be lipemic--needs to have fasting lipids.  She recalls lipids being checked in past (not recent), and believes it was normal.  She would like to lose weight, and is asking about some over-the-counter fat-absorbing supplements.  Past Medical History  Diagnosis Date  . Anxiety panic attacks  . Hypertension   . Kidney stones h/o  . Chronic neck pain   . Chronic back pain   . Migraine     Past Surgical History  Procedure Date  . Neck surgery [name]  Summer 2011  . Back surgery Dr.Stern, last sx Summer 2011  . Tonsillectomy   . Rhinoplasty   . Neck surgery 03/2011    History   Social History  . Marital Status: Married    Spouse Name: N/A    Number of Children: N/A  . Years of Education: N/A   Occupational History  . Not on file.   Social History Main Topics  .  Smoking status: Former Smoker    Quit date: 07/04/1987  . Smokeless tobacco: Not on file  . Alcohol Use: Yes     1 drink per week  . Drug Use: No  . Sexually Active: Not on file   Other Topics Concern  . Not on file   Social History Narrative  . No narrative on file    Family History  Problem Relation Age of Onset  . Hypertension Mother   . Depression Mother   . Migraines Mother   . COPD Father   . Heart failure Father   . Breast cancer Cousin     Current outpatient prescriptions:amLODipine-benazepril (LOTREL) 10-20 MG per capsule, Take 1 capsule by mouth daily.  , Disp: , Rfl: ;  ARIPiprazole (ABILIFY) 2 MG tablet, Take 2 mg by mouth daily.  , Disp: , Rfl: ;  buPROPion (WELLBUTRIN XL) 150 MG 24 hr tablet, Take 450 mg by mouth daily.  , Disp: , Rfl: ;  loratadine (CLARITIN) 10 MG tablet, Take 10 mg by mouth daily.  , Disp: , Rfl:  Vilazodone HCl (VIIBRYD) 40 MG TABS, Take 1 tablet by mouth daily.  , Disp: , Rfl: ;  gabapentin (NEURONTIN) 800 MG tablet, Take 0.5 tablets (400 mg total) by mouth 3 (three) times daily., Disp: 90 tablet, Rfl: 1;  naproxen (NAPROSYN) 500 MG tablet, Take 1 tablet (500 mg total) by  mouth 2 (two) times daily with a meal. Take as needed for pain, Disp: 60 tablet, Rfl: 1;  tiZANidine (ZANAFLEX) 2 MG tablet, , Disp: , Rfl:   Allergies  Allergen Reactions  . Penicillins Shortness Of Breath and Rash  . Codeine Other (See Comments)    Manic depressive  . Erythromycin Rash   ROS:  Denies fevers, sinus problems, headaches, dizziness, cough, SOB, skin rash.  See HPI.  PHYSICAL EXAM: BP 108/78  Pulse 80  Ht 5\' 7"  (1.702 m)  Wt 198 lb (89.812 kg)  BMI 31.01 kg/m2 Well developed, pleasant female in no distress Neck: no lymphadenopathy.  Healing vertical surgical incision posterior neck without evidence of infection Heart: regular rate and rhythm without murmur Lungs: clear bilaterally Extremities: no edema, 2+ pulse Psych: normal mood, affect, hygiene and  grooming  ASSESSMENT/PLAN: 1. Essential hypertension, benign  Lipid panel  2. Neuropathy    3. Unspecified vitamin D deficiency     Desire for weight loss.  Recommended Weight Watchers, daily exercise.  Consider discussing Topamax with Dr. Evelene Croon, her psychiatrist. Migraines--resolved/improved with improvement in neck pain s/p surgery HTN--well controlled.  Lipemic blood specimen in April.  H/o normal lipids per pt, but not checked for many years.  Given her HTN as a risk factor for heart disease, recommend that she follow a low cholesterol, lowfat diet, and return for fasting labwork (lipids) Neuropathy--per pt request, restart 1/2 tab (400mg  dose) at bedtime, gradually increase to BID if needed, up to TID if needed. Vitamin D--mild deficiency.  Restart Vitamin D 1000 IU once daily (OTC)

## 2011-04-24 ENCOUNTER — Other Ambulatory Visit: Payer: Self-pay | Admitting: Family Medicine

## 2011-04-24 DIAGNOSIS — Z1231 Encounter for screening mammogram for malignant neoplasm of breast: Secondary | ICD-10-CM

## 2011-04-26 ENCOUNTER — Other Ambulatory Visit: Payer: BC Managed Care – PPO

## 2011-04-26 ENCOUNTER — Ambulatory Visit
Admission: RE | Admit: 2011-04-26 | Discharge: 2011-04-26 | Disposition: A | Discharge status: Home / Self Care | Payer: BC Managed Care – PPO | Source: Ambulatory Visit | Attending: Family Medicine | Admitting: Family Medicine

## 2011-04-26 DIAGNOSIS — Z1231 Encounter for screening mammogram for malignant neoplasm of breast: Secondary | ICD-10-CM

## 2011-04-26 DIAGNOSIS — I10 Essential (primary) hypertension: Secondary | ICD-10-CM

## 2011-04-26 LAB — LIPID PANEL
Cholesterol: 179 mg/dL (ref 0–200)
HDL: 38 mg/dL — ABNORMAL LOW (ref >39)
LDL Cholesterol: 71 mg/dL (ref 0–99)
Total CHOL/HDL Ratio: 4.7 Ratio
Triglycerides: 350 mg/dL — ABNORMAL HIGH (ref <150)
VLDL: 70 mg/dL — ABNORMAL HIGH (ref 0–40)

## 2011-04-27 ENCOUNTER — Other Ambulatory Visit: Payer: Self-pay | Admitting: *Deleted

## 2011-04-27 ENCOUNTER — Encounter: Payer: Self-pay | Admitting: Family Medicine

## 2011-04-27 DIAGNOSIS — E781 Pure hyperglyceridemia: Secondary | ICD-10-CM | POA: Insufficient documentation

## 2011-04-27 DIAGNOSIS — Z79899 Other long term (current) drug therapy: Secondary | ICD-10-CM

## 2011-04-27 MED ORDER — FENOFIBRATE 54 MG PO TABS: 54.0000 mg | ORAL_TABLET | Freq: Every day | ORAL | Status: DC

## 2011-05-27 ENCOUNTER — Other Ambulatory Visit: Payer: Self-pay | Admitting: Family Medicine

## 2011-05-29 NOTE — Telephone Encounter (Signed)
Dr.Knapp,  Is this ok to fill?

## 2011-06-12 ENCOUNTER — Other Ambulatory Visit: Payer: BC Managed Care – PPO

## 2011-06-12 DIAGNOSIS — Z79899 Other long term (current) drug therapy: Secondary | ICD-10-CM

## 2011-06-12 DIAGNOSIS — E781 Pure hyperglyceridemia: Secondary | ICD-10-CM

## 2011-06-13 LAB — HEPATIC FUNCTION PANEL
ALT: 20 U/L (ref 0–35)
AST: 19 U/L (ref 0–37)
Albumin: 4.6 g/dL (ref 3.5–5.2)
Alkaline Phosphatase: 61 U/L (ref 39–117)
Bilirubin, Direct: 0.1 mg/dL (ref 0.0–0.3)
Indirect Bilirubin: 0.4 mg/dL (ref 0.0–0.9)
Total Bilirubin: 0.5 mg/dL (ref 0.3–1.2)
Total Protein: 6.8 g/dL (ref 6.0–8.3)

## 2011-06-13 LAB — LIPID PANEL
Cholesterol: 207 mg/dL — ABNORMAL HIGH (ref 0–200)
HDL: 44 mg/dL (ref >39)
LDL Cholesterol: 115 mg/dL — ABNORMAL HIGH (ref 0–99)
Total CHOL/HDL Ratio: 4.7 Ratio
Triglycerides: 238 mg/dL — ABNORMAL HIGH (ref <150)
VLDL: 48 mg/dL — ABNORMAL HIGH (ref 0–40)

## 2011-06-19 ENCOUNTER — Ambulatory Visit (INDEPENDENT_AMBULATORY_CARE_PROVIDER_SITE_OTHER): Payer: BC Managed Care – PPO | Admitting: Family Medicine

## 2011-06-19 ENCOUNTER — Encounter: Payer: Self-pay | Admitting: Family Medicine

## 2011-06-19 DIAGNOSIS — E782 Mixed hyperlipidemia: Secondary | ICD-10-CM

## 2011-06-19 DIAGNOSIS — Z79899 Other long term (current) drug therapy: Secondary | ICD-10-CM

## 2011-06-19 DIAGNOSIS — E781 Pure hyperglyceridemia: Secondary | ICD-10-CM

## 2011-06-19 MED ORDER — FENOFIBRATE 160 MG PO TABS: 160.0000 mg | ORAL_TABLET | Freq: Every day | ORAL | Status: DC

## 2011-06-19 NOTE — Progress Notes (Signed)
Patient presents accompanied by her husband to follow up on her lab results. She is anxious to find out her results.  She denies side effects from her medication (fenofibrate 54mg  daily), or any change in diet.  Only taking 1 fish oil capsule daily--feels like she takes enough medications and doesn't want more pills if she doesn't have to.  Past Medical History  Diagnosis Date  . Anxiety panic attacks  . Hypertension   . Kidney stones h/o  . Chronic neck pain   . Chronic back pain   . Migraine     Past Surgical History  Procedure Date  . Neck surgery [name]  Summer 2011  . Back surgery Dr.Stern, last sx Summer 2011  . Tonsillectomy   . Rhinoplasty   . Neck surgery 03/2011    History   Social History  . Marital Status: Married    Spouse Name: N/A    Number of Children: N/A  . Years of Education: N/A   Occupational History  . Not on file.   Social History Main Topics  . Smoking status: Former Smoker    Quit date: 07/04/1987  . Smokeless tobacco: Not on file  . Alcohol Use: Yes     1 drink per week  . Drug Use: No  . Sexually Active: Not on file   Other Topics Concern  . Not on file   Social History Narrative  . No narrative on file    Family History  Problem Relation Age of Onset  . Hypertension Mother   . Depression Mother   . Migraines Mother   . COPD Father   . Heart failure Father   . Breast cancer Cousin    Current Outpatient Prescriptions on File Prior to Visit  Medication Sig Dispense Refill  . amLODipine-benazepril (LOTREL) 10-20 MG per capsule Take 1 capsule by mouth daily.        . ARIPiprazole (ABILIFY) 2 MG tablet Take 2 mg by mouth daily.        Marland Kitchen buPROPion (WELLBUTRIN XL) 150 MG 24 hr tablet Take 450 mg by mouth daily.        Marland Kitchen gabapentin (NEURONTIN) 800 MG tablet 1/2 - 1 tab po BID as directed  90 tablet  1  . loratadine (CLARITIN) 10 MG tablet Take 10 mg by mouth daily.        . Vilazodone HCl (VIIBRYD) 40 MG TABS Take 1 tablet by mouth  daily.        . naproxen (NAPROSYN) 500 MG tablet Take 1 tablet (500 mg total) by mouth 2 (two) times daily with a meal. Take as needed for pain  60 tablet  1  . tiZANidine (ZANAFLEX) 2 MG tablet       Fenofibrate 54 mg daily  Allergies  Allergen Reactions  . Penicillins Shortness Of Breath and Rash  . Codeine Other (See Comments)    Manic depressive  . Erythromycin Rash   ROS:  Denies fevers, URI symptoms, cough, shortness of breath, chest pain, nausea, vomiting, myalgias or other complaints  PHYSICAL EXAM: BP 118/76  Pulse 68  Ht 5\' 7"  (1.702 m)  Wt 197 lb (89.359 kg)  BMI 30.85 kg/m2  LMP 06/05/2011 Somewhat anxious appearing, pleasant female in no distress. Remainder of exam/visit limited to discussion of results.  Lab results: Lab Results  Component Value Date   CHOL 207* 06/12/2011   CHOL 179 04/26/2011   Lab Results  Component Value Date   HDL 44 06/12/2011  HDL 38* 04/26/2011   Lab Results  Component Value Date   LDLCALC 115* 06/12/2011   LDLCALC 71 04/26/2011   Lab Results  Component Value Date   TRIG 238* 06/12/2011   TRIG 350* 04/26/2011   Lab Results  Component Value Date   CHOLHDL 4.7 06/12/2011   CHOLHDL 4.7 04/26/2011   No results found for this basename: LDLDIRECT   Lab Results  Component Value Date   ALT 20 06/12/2011   AST 19 06/12/2011   ALKPHOS 61 06/12/2011   BILITOT 0.5 06/12/2011   ASSESSMENT/PLAN: 1. Mixed hyperlipidemia  Lipid panel, Hepatic function panel  2. Encounter for long-term (current) use of other medications  Lipid panel, Hepatic function panel  3. Hypertriglyceridemia  fenofibrate 160 MG tablet   TG improved, but still not at goal. Increase to 160 (generic); increase fish oil, if tolerated, to 3000-4000 mg daily.  Lowfat, low cholesterol diet was reviewed in detail. Re-check in 6-8 weeks (lab visit only--letter if normal; OV if meds need to be adjusted further)

## 2011-06-19 NOTE — Patient Instructions (Signed)
We are increasing the dose of your medication.  Continue lowfat, low cholesterol diet.  Continue your fish oil--try increasing to 3000-4000 mg daily, if tolerated

## 2011-07-21 ENCOUNTER — Telehealth: Payer: Self-pay | Admitting: Family Medicine

## 2011-07-21 DIAGNOSIS — I10 Essential (primary) hypertension: Secondary | ICD-10-CM

## 2011-07-21 MED ORDER — AMLODIPINE BESY-BENAZEPRIL HCL 10-20 MG PO CAPS: 1.0000 | ORAL_CAPSULE | Freq: Every day | ORAL | Status: DC

## 2011-07-21 NOTE — Telephone Encounter (Signed)
E-rxd

## 2011-07-27 ENCOUNTER — Encounter: Payer: Self-pay | Admitting: Internal Medicine

## 2011-07-31 ENCOUNTER — Other Ambulatory Visit: Payer: BC Managed Care – PPO

## 2011-07-31 DIAGNOSIS — E782 Mixed hyperlipidemia: Secondary | ICD-10-CM

## 2011-07-31 DIAGNOSIS — Z79899 Other long term (current) drug therapy: Secondary | ICD-10-CM

## 2011-07-31 LAB — LIPID PANEL
Cholesterol: 201 mg/dL — ABNORMAL HIGH (ref 0–200)
HDL: 48 mg/dL (ref >39)
LDL Cholesterol: 123 mg/dL — ABNORMAL HIGH (ref 0–99)
Total CHOL/HDL Ratio: 4.2 Ratio
Triglycerides: 148 mg/dL (ref <150)
VLDL: 30 mg/dL (ref 0–40)

## 2011-07-31 LAB — HEPATIC FUNCTION PANEL
ALT: 16 U/L (ref 0–35)
AST: 16 U/L (ref 0–37)
Albumin: 4.8 g/dL (ref 3.5–5.2)
Alkaline Phosphatase: 55 U/L (ref 39–117)
Bilirubin, Direct: 0.1 mg/dL (ref 0.0–0.3)
Indirect Bilirubin: 0.2 mg/dL (ref 0.0–0.9)
Total Bilirubin: 0.3 mg/dL (ref 0.3–1.2)
Total Protein: 7.4 g/dL (ref 6.0–8.3)

## 2011-08-01 ENCOUNTER — Encounter: Payer: Self-pay | Admitting: Family Medicine

## 2011-09-04 ENCOUNTER — Other Ambulatory Visit: Payer: Self-pay | Admitting: Family Medicine

## 2011-10-11 ENCOUNTER — Encounter: Payer: Self-pay | Admitting: Family Medicine

## 2011-10-11 ENCOUNTER — Ambulatory Visit (INDEPENDENT_AMBULATORY_CARE_PROVIDER_SITE_OTHER): Payer: BC Managed Care – PPO | Admitting: Family Medicine

## 2011-10-11 VITALS — BP 92/60 | HR 52 | Ht 67.0 in | Wt 191.0 lb

## 2011-10-11 DIAGNOSIS — R5381 Other malaise: Secondary | ICD-10-CM

## 2011-10-11 DIAGNOSIS — R5383 Other fatigue: Secondary | ICD-10-CM

## 2011-10-11 DIAGNOSIS — I10 Essential (primary) hypertension: Secondary | ICD-10-CM

## 2011-10-11 DIAGNOSIS — E559 Vitamin D deficiency, unspecified: Secondary | ICD-10-CM

## 2011-10-11 DIAGNOSIS — R51 Headache: Secondary | ICD-10-CM

## 2011-10-11 LAB — COMPREHENSIVE METABOLIC PANEL
ALT: 21 U/L (ref 0–35)
AST: 19 U/L (ref 0–37)
Albumin: 4.8 g/dL (ref 3.5–5.2)
Alkaline Phosphatase: 44 U/L (ref 39–117)
BUN: 15 mg/dL (ref 6–23)
CO2: 25 mEq/L (ref 19–32)
Calcium: 9.9 mg/dL (ref 8.4–10.5)
Chloride: 104 mEq/L (ref 96–112)
Creat: 0.96 mg/dL (ref 0.50–1.10)
Glucose, Bld: 80 mg/dL (ref 70–99)
Potassium: 4.7 mEq/L (ref 3.5–5.3)
Sodium: 138 mEq/L (ref 135–145)
Total Bilirubin: 0.4 mg/dL (ref 0.3–1.2)
Total Protein: 7.7 g/dL (ref 6.0–8.3)

## 2011-10-11 LAB — CBC WITH DIFFERENTIAL/PLATELET
Basophils Absolute: 0 10*3/uL (ref 0.0–0.1)
Basophils Relative: 1 % (ref 0–1)
Eosinophils Absolute: 0.1 10*3/uL (ref 0.0–0.7)
Eosinophils Relative: 1 % (ref 0–5)
HCT: 38.7 % (ref 36.0–46.0)
Hemoglobin: 12.5 g/dL (ref 12.0–15.0)
Lymphocytes Relative: 39 % (ref 12–46)
Lymphs Abs: 3.1 10*3/uL (ref 0.7–4.0)
MCH: 31.4 pg (ref 26.0–34.0)
MCHC: 32.3 g/dL (ref 30.0–36.0)
MCV: 97.2 fL (ref 78.0–100.0)
Monocytes Absolute: 0.5 10*3/uL (ref 0.1–1.0)
Monocytes Relative: 7 % (ref 3–12)
Neutro Abs: 4.2 10*3/uL (ref 1.7–7.7)
Neutrophils Relative %: 53 % (ref 43–77)
Platelets: 399 10*3/uL (ref 150–400)
RBC: 3.98 MIL/uL (ref 3.87–5.11)
RDW: 13.5 % (ref 11.5–15.5)
WBC: 8 10*3/uL (ref 4.0–10.5)

## 2011-10-11 LAB — TSH: TSH: 1.23 u[IU]/mL (ref 0.350–4.500)

## 2011-10-11 MED ORDER — SUMATRIPTAN-NAPROXEN SODIUM 85-500 MG PO TABS: 1.0000 | ORAL_TABLET | Freq: Once | ORAL | Status: DC

## 2011-10-11 MED ORDER — AMLODIPINE BESY-BENAZEPRIL HCL 5-20 MG PO CAPS: 1.0000 | ORAL_CAPSULE | Freq: Every day | ORAL | Status: DC

## 2011-10-11 NOTE — Progress Notes (Signed)
Chief complaint:  excessive fatigue and dizziness x several months. Also migraine increasing in last month. Took Treximet April of last year(see progress note section of paper chart).  HPI:  Having significant fatigue--has to stop vacuuming and rest, takes a nap, and wakes up still tired.  Husband hears mild snoring, but no apnea. Tired all day long.  HTN:  Hasn't been checking her BP's regularly, last checked 2 months ago. Having dizziness after bending over, standing, to the point where she almost passes out.  This has been going on for several months.  Hasn't been taking the neurontin in the last month, has "been tolerating" the pain in her feet from the neuropathy. Had 2 migraines in the last month.  Used Excedrin Migraine for the first, and used Treximet for the 2nd.  It knocked it down some, but not gone entirely, even after second dose.  H/o vitamin D deficiency.  Ran out of her Vitamin D supplement a month ago, and has forgotten to get more.  Past Medical History  Diagnosis Date  . Anxiety panic attacks  . Hypertension   . Kidney stones h/o  . Chronic neck pain   . Chronic back pain   . Migraine     Past Surgical History  Procedure Date  . Neck surgery [name]  Summer 2011  . Back surgery Dr.Stern, last sx Summer 2011  . Tonsillectomy   . Rhinoplasty   . Neck surgery 03/2011    History   Social History  . Marital Status: Married    Spouse Name: N/A    Number of Children: N/A  . Years of Education: N/A   Occupational History  . Not on file.   Social History Main Topics  . Smoking status: Former Smoker    Quit date: 07/04/1987  . Smokeless tobacco: Not on file  . Alcohol Use: Yes     1 drink per week  . Drug Use: No  . Sexually Active: Not on file   Other Topics Concern  . Not on file   Social History Narrative  . No narrative on file    Family History  Problem Relation Age of Onset  . Hypertension Mother   . Depression Mother   . Migraines Mother   .  COPD Father   . Heart failure Father   . Breast cancer Cousin    Current Outpatient Prescriptions on File Prior to Visit  Medication Sig Dispense Refill  . ARIPiprazole (ABILIFY) 2 MG tablet Take 2 mg by mouth daily.        Marland Kitchen buPROPion (WELLBUTRIN XL) 150 MG 24 hr tablet Take 450 mg by mouth daily.        . fenofibrate 160 MG tablet TAKE 1 TABLET (160 MG TOTAL) BY MOUTH DAILY.  30 tablet  1  . Ferrous Sulfate (IRON) 325 (65 FE) MG TABS Take 1 tablet by mouth.        . loratadine (CLARITIN) 10 MG tablet Take 10 mg by mouth daily.        . naproxen (NAPROSYN) 500 MG tablet Take 1 tablet (500 mg total) by mouth 2 (two) times daily with a meal. Take as needed for pain  60 tablet  1  . tiZANidine (ZANAFLEX) 2 MG tablet       . Vilazodone HCl (VIIBRYD) 40 MG TABS Take 1 tablet by mouth daily.        . vitamin C (ASCORBIC ACID) 500 MG tablet Take 500 mg by mouth daily.        Marland Kitchen  DISCONTD: amLODipine-benazepril (LOTREL) 10-20 MG per capsule Take 1 capsule by mouth daily.  30 capsule  3  . Calcium Carbonate-Vit D-Min (CALCIUM 600+D PLUS MINERALS) 600-400 MG-UNIT TABS Take 1 tablet by mouth daily.        Marland Kitchen gabapentin (NEURONTIN) 800 MG tablet 1/2 - 1 tab po BID as directed  90 tablet  1  . Multiple Vitamins-Minerals (MULTIVITAMIN WITH MINERALS) tablet Take 1 tablet by mouth daily.        . SUMAtriptan-naproxen (TREXIMET) 85-500 MG per tablet Take 1 tablet by mouth once. May repeat in 2 hours if needed (max of 2 tabs/24 hours)  10 tablet  0   Allergies  Allergen Reactions  . Penicillins Shortness Of Breath and Rash  . Codeine Other (See Comments)    Manic depressive  . Erythromycin Rash   ROS:  Denies vomiting, diarrhea.  Frequent nausea.  Having trouble losing weight.  Has started yoga.  Some memory problems, allergies. Denies fevers, URI symptoms, shortness of breath, chest pain, urinary complaints, or other problems.  See HPI.  PHYSICAL EXAM: BP 92/60  Pulse 52  Ht 5\' 7"  (1.702 m)  Wt 191 lb  (86.637 kg)  BMI 29.91 kg/m2  LMP 09/25/2011 Well developed, pleasant female, accompanied by her husband, in no distress HEENT:  PERRL, conjunctiva clear.  OP clear Neck: no lymphadenopathy, thyromegaly or mass Heart: regular rate and rhythm Lungs: clear bilaterally Abdomen: soft, nontender, no mass Extremities: no edema Skin: no rash Psych: somewhat flat affect, otherwise normal mood, hygiene, grooming, speech, eye contact.  ASSESSMENT/PLAN: 1. Essential hypertension, benign  Comprehensive metabolic panel, amLODipine-benazepril (LOTREL) 5-20 MG per capsule  2. Headache  SUMAtriptan-naproxen (TREXIMET) 85-500 MG per tablet  3. Unspecified vitamin D deficiency  Vitamin D 25 hydroxy  4. Other malaise and fatigue  Comprehensive metabolic panel, CBC with Differential, TSH   HTN--BP too low today, may be contributing to her dizziness and fatigue.  Change lotrel to 5/20.  Monitor BP's regularly.  If still too low, can change to 5/10.  Drink plenty of fluids  Check labs today--CBC, c-met, TSH, Vitamin D-OH  Restart neurontin, which may help with headaches.  Refill treximet.  F/u 2 weeks on labs, blood pressure

## 2011-10-11 NOTE — Patient Instructions (Signed)
Change to lower dose of blood pressure medication.  Stay well hydrated.  Check blood pressures and keep a record that you will bring to your next appointment.  Restart 1000 IU of vitamin D daily  Restart neurontin--this may have been helping prevent migraines.

## 2011-10-12 ENCOUNTER — Encounter: Payer: Self-pay | Admitting: Family Medicine

## 2011-10-12 LAB — VITAMIN D 25 HYDROXY (VIT D DEFICIENCY, FRACTURES): Vit D, 25-Hydroxy: 32 ng/mL (ref 30–89)

## 2011-10-19 ENCOUNTER — Other Ambulatory Visit: Payer: Self-pay | Admitting: Family Medicine

## 2011-10-25 ENCOUNTER — Ambulatory Visit: Payer: BC Managed Care – PPO | Admitting: Family Medicine

## 2011-10-26 ENCOUNTER — Encounter: Payer: Self-pay | Admitting: Family Medicine

## 2011-10-26 ENCOUNTER — Ambulatory Visit: Payer: BC Managed Care – PPO | Admitting: Family Medicine

## 2011-10-26 ENCOUNTER — Ambulatory Visit (INDEPENDENT_AMBULATORY_CARE_PROVIDER_SITE_OTHER): Payer: BC Managed Care – PPO | Admitting: Family Medicine

## 2011-10-26 VITALS — BP 98/70 | HR 58 | Temp 98.2°F | Ht 66.25 in | Wt 190.0 lb

## 2011-10-26 DIAGNOSIS — R5383 Other fatigue: Secondary | ICD-10-CM | POA: Insufficient documentation

## 2011-10-26 DIAGNOSIS — E559 Vitamin D deficiency, unspecified: Secondary | ICD-10-CM

## 2011-10-26 DIAGNOSIS — R5381 Other malaise: Secondary | ICD-10-CM

## 2011-10-26 DIAGNOSIS — I10 Essential (primary) hypertension: Secondary | ICD-10-CM

## 2011-10-26 MED ORDER — AMLODIPINE BESY-BENAZEPRIL HCL 5-10 MG PO CAPS: 1.0000 | ORAL_CAPSULE | Freq: Every day | ORAL | Status: DC

## 2011-10-26 NOTE — Progress Notes (Signed)
Chief complaint: 2 week f/u on bp and fatigue. pt is feeling somewhat better, pt does gett dizzy if moving to fast or bending, headaches  HPI:  Her Lotrel dose was decreased to 5/20 at her last visit, as she was complaining of fatigue, dizziness, and her blood pressure was low.  Had labs done, all of which were normal.  Since lowering dose of BP med, her BP's have been running 128/74 at night at home. Dizziness is better, but not gone, and fatigue persists. Still has problems with a lot of dizziness when she leans forward, although not as bad as before.  Restarted neurontin at last visit, just once daily in the morning, just 1/2 tablet (400mg ).  Still waking up with headaches, which remains during the day, but headache doesn't seem to be as severe as prior to restarting the neurontin.  Headaches are across the back of her head. She had originally stopped (the 800mg  TID dose) med due to not helping with the numbness/tingling in feet.   Increased frequency of panic attacks, now getting 3-4 per week.  Using xanax, which sedates her. She is followed by Dr. Evelene Croon.  PHYSICAL EXAM: BP 102/64  Pulse 58  Temp 98.2 F (36.8 C)  Ht 5' 6.25" (1.683 m)  Wt 190 lb (86.183 kg)  BMI 30.44 kg/m2  LMP 09/25/2011 98/70 by MD on repeat Well developed, pleasant female, accompanied by her husband, in no distress Heart: regular rate and rhythm, no murmur Lungs; clear bilaterally Abdomen: soft, nontender Skin: no rash Psych: somewhat flat affect, normal mood     Chemistry      Component Value Date/Time   NA 138 10/11/2011 0910   K 4.7 10/11/2011 0910   CL 104 10/11/2011 0910   CO2 25 10/11/2011 0910   BUN 15 10/11/2011 0910   CREATININE 0.96 10/11/2011 0910   CREATININE 0.90 02/28/2011 0938      Component Value Date/Time   CALCIUM 9.9 10/11/2011 0910   ALKPHOS 44 10/11/2011 0910   AST 19 10/11/2011 0910   ALT 21 10/11/2011 0910   BILITOT 0.4 10/11/2011 0910     Lab Results  Component Value Date   WBC 8.0  10/11/2011   HGB 12.5 10/11/2011   HCT 38.7 10/11/2011   MCV 97.2 10/11/2011   PLT 399 10/11/2011   Lab Results  Component Value Date   TSH 1.230 10/11/2011   Vitamin D-OH 32  ASSESSMENT/PLAN: 1. Unspecified vitamin D deficiency   2. Essential hypertension, benign   3. Fatigue     HTN--still lower than it needs to be, with ongoing dizziness.  Lower lotrel dose to 5-10.  Continue to monitor BP's at home.  Bring BP list to f/u in 6 weeks  Headaches--a little better.  Increase neurontin to 400 mg TID, and if needed, can further titrate back up to 800mg  TID dose, at which dose she seemed to tolerate in past, and didn't have headaches (even though that wasn't what med was rx'd for ).  If ongoing headaches, may need to change meds, vs send to headache clinic.  Recommended that she f/u with Dr. Evelene Croon regarding increased frequency of panic attacks, and fatigue which may partly be related to psych medications.  F/u 6 weeks, sooner prn

## 2011-10-26 NOTE — Patient Instructions (Signed)
Change to lower dose of blood pressure medication.  Continue to check BP and write down--bring list to follow up appointment.  Follow up with Dr. Evelene Croon regarding your fatigue (? Medication related) and worsening panic attacks.  Continue to titrate up the neurontin to hopefully help with headaches--increase to 400 mg three times daily, and leave there for a month, increase further only if needed.

## 2011-11-06 ENCOUNTER — Other Ambulatory Visit: Payer: Self-pay | Admitting: Family Medicine

## 2011-12-07 ENCOUNTER — Encounter: Payer: Self-pay | Admitting: Family Medicine

## 2011-12-07 ENCOUNTER — Ambulatory Visit (INDEPENDENT_AMBULATORY_CARE_PROVIDER_SITE_OTHER): Payer: BC Managed Care – PPO | Admitting: Family Medicine

## 2011-12-07 VITALS — BP 110/70 | Ht 66.5 in | Wt 190.0 lb

## 2011-12-07 DIAGNOSIS — I10 Essential (primary) hypertension: Secondary | ICD-10-CM

## 2011-12-07 DIAGNOSIS — R42 Dizziness and giddiness: Secondary | ICD-10-CM

## 2011-12-07 DIAGNOSIS — R51 Headache: Secondary | ICD-10-CM

## 2011-12-07 MED ORDER — NAPROXEN 500 MG PO TABS: 500.0000 mg | ORAL_TABLET | Freq: Two times a day (BID) | ORAL | Status: AC

## 2011-12-07 MED ORDER — TIZANIDINE HCL 2 MG PO TABS: 2.0000 mg | ORAL_TABLET | Freq: Four times a day (QID) | ORAL | Status: DC | PRN

## 2011-12-07 MED ORDER — AMLODIPINE BESY-BENAZEPRIL HCL 5-10 MG PO CAPS: 1.0000 | ORAL_CAPSULE | Freq: Every day | ORAL | Status: DC

## 2011-12-07 NOTE — Patient Instructions (Signed)
Heat and stretches to neck twice daily. Restart the naproxen twice daily with food initially for at least a week, and can then back down to as needed. Use the tizanidine only if needed for muscle spasm. If you continue to have ongoing neck pain (causing headaches), we can refer for PT.  Try meclizine (available OTC as bonine and in other formulations, as 12.5 or 25 mg) as needed for vertigo.  Continue your current blood pressure medications--BP is doing very well.  Plan to follow up in end of July/August for routine cholesterol follow-up.

## 2011-12-07 NOTE — Progress Notes (Signed)
Chief Complaint  Patient presents with  . follow-up on bp    follow up on bp. pt brought readings with her, pt gets dizzy very easily   HPI: Patient presents to follow up on her BP.  Dose of Lotrel was further decreased to 5-10 at last visit, due to ongoing dizziness and lower blood pressures than necessary.    Most of the blood pressure reading are in the evenings.  BP's have been running 116-139/66-85, mostly running in the 120's to low 130's. Tends to feel dizzier in the mornings, sometimes during the day, usually when she is in a hurry.  She currently has a headache, and feels dizzy even while sitting.  Describes dizziness as a vertigo sensation.  The lightheadedness that she previously was also experiencing has resolved with lowering dose of BP medications.  Allergies were bad about a month ago, doing better now.  Denies congestion, ear plugging, ringing in ears.  Occasional plugging/popping of her ears.  Still waking up with headaches, even since being back on neurontin, and is currently at 400/400/800.  Headaches last all day.  Headache currently is at the back of her head and into the neck.  Only uses pillow when sleeps on her side (side sleeper pillow).   Headaches (migraines) have greatly improved since being back on the neurontin, having daily morning posterior/neck headaches only over the last month or so.  Past Medical History  Diagnosis Date  . Anxiety panic attacks    Dr. Evelene Croon  . Hypertension   . Kidney stones h/o  . Chronic neck pain   . Chronic back pain   . Migraine    Past Surgical History  Procedure Date  . Neck surgery [name]  Summer 2011  . Back surgery Dr.Stern, last sx Summer 2011  . Tonsillectomy   . Rhinoplasty   . Neck surgery 03/2011   History   Social History  . Marital Status: Married    Spouse Name: N/A    Number of Children: N/A  . Years of Education: N/A   Occupational History  . Not on file.   Social History Main Topics  . Smoking  status: Former Smoker    Quit date: 07/04/1987  . Smokeless tobacco: Never Used  . Alcohol Use: Yes     1 drink per week  . Drug Use: No  . Sexually Active: Not on file   Other Topics Concern  . Not on file   Social History Narrative  . No narrative on file   Current Outpatient Prescriptions on File Prior to Visit  Medication Sig Dispense Refill  . ALPRAZolam (XANAX) 1 MG tablet Take 0.5 mg by mouth 3 (three) times daily as needed.      . ARIPiprazole (ABILIFY) 2 MG tablet Take 2 mg by mouth daily.        Marland Kitchen buPROPion (WELLBUTRIN XL) 150 MG 24 hr tablet Take 450 mg by mouth daily.        . cholecalciferol (VITAMIN D) 1000 UNITS tablet Take 1,000 Units by mouth daily.      . fenofibrate 160 MG tablet TAKE 1 TABLET (160 MG TOTAL) BY MOUTH DAILY.  30 tablet  1  . Ferrous Sulfate (IRON) 325 (65 FE) MG TABS Take 1 tablet by mouth.        . gabapentin (NEURONTIN) 800 MG tablet TAKE 1/2 TABLET (400 MG TOTAL) BY MOUTH 3 (THREE) TIMES DAILY.  90 tablet  0  . loratadine (CLARITIN) 10 MG tablet Take  10 mg by mouth daily.        . Multiple Vitamins-Minerals (MULTIVITAMIN WITH MINERALS) tablet Take 1 tablet by mouth daily.        . OMEGA 3 1000 MG CAPS Take 1 capsule by mouth daily.      . Vilazodone HCl (VIIBRYD) 40 MG TABS Take 1 tablet by mouth daily.        . vitamin C (ASCORBIC ACID) 500 MG tablet Take 500 mg by mouth daily.        Marland Kitchen DISCONTD: amLODipine-benazepril (LOTREL) 5-10 MG per capsule Take 1 capsule by mouth daily.  30 capsule  1  . Calcium Carbonate-Vit D-Min (CALCIUM 600+D PLUS MINERALS) 600-400 MG-UNIT TABS Take 1 tablet by mouth daily.        . SUMAtriptan-naproxen (TREXIMET) 85-500 MG per tablet Take 1 tablet by mouth once. May repeat in 2 hours if needed (max of 2 tabs/24 hours)  10 tablet  0   Allergies  Allergen Reactions  . Penicillins Shortness Of Breath and Rash  . Codeine Other (See Comments)    Manic depressive  . Erythromycin Rash   ROS:  Denies fevers, URI  symptoms, allergies, cough, shortness of breath, chest pain, GI complaints, skin rash. +headaches, vertigo as per HPI. +neck pain.  No numbness/tingling.  PHYSICAL EXAM: BP 110/70  Ht 5' 6.5" (1.689 m)  Wt 190 lb (86.183 kg)  BMI 30.21 kg/m2 Well developed, pleasant female in no distress Neck: well healed midline surgical scar.  Mildly tender diffusely--midline and bilateral paraspinous muscles. No lymphadenopathy, thyromegaly or mass. Normal strength, sensation, DTR. Heart: regular rate and rhythm without murmur Lungs: clear bilaterally Extremities: no edema  ASSESSMENT/PLAN: 1. Essential hypertension, benign  amLODipine-benazepril (LOTREL) 5-10 MG per capsule  2. Headache  naproxen (NAPROSYN) 500 MG tablet, tiZANidine (ZANAFLEX) 2 MG tablet  3. Vertigo      HTN--well controlled Dizziness--seems more like only vertigo now, lightheadedness resolved with lowering BP medication dose. Trial of Meclizine as needed  Headaches--migraines improved with neurontin--continue. Now seems to have muscular component.  Restart Naproxen and tizanidine as needed.  Consider PT.  Also recommend heat and stretches. Risks and side effects of medications reviewed.  (f/u 6-8 weeks, when due for f/u on lipids--prefers fasting visit rather than labs prior)

## 2012-01-01 ENCOUNTER — Other Ambulatory Visit: Payer: Self-pay | Admitting: Family Medicine

## 2012-01-18 ENCOUNTER — Other Ambulatory Visit: Payer: Self-pay | Admitting: Family Medicine

## 2012-03-06 ENCOUNTER — Ambulatory Visit (INDEPENDENT_AMBULATORY_CARE_PROVIDER_SITE_OTHER): Payer: BC Managed Care – PPO | Admitting: Family Medicine

## 2012-03-06 ENCOUNTER — Encounter: Payer: Self-pay | Admitting: Family Medicine

## 2012-03-06 VITALS — BP 120/76 | HR 68 | Ht 66.5 in | Wt 189.0 lb

## 2012-03-06 DIAGNOSIS — G43909 Migraine, unspecified, not intractable, without status migrainosus: Secondary | ICD-10-CM

## 2012-03-06 DIAGNOSIS — E781 Pure hyperglyceridemia: Secondary | ICD-10-CM

## 2012-03-06 DIAGNOSIS — J309 Allergic rhinitis, unspecified: Secondary | ICD-10-CM

## 2012-03-06 DIAGNOSIS — M2550 Pain in unspecified joint: Secondary | ICD-10-CM

## 2012-03-06 DIAGNOSIS — I10 Essential (primary) hypertension: Secondary | ICD-10-CM

## 2012-03-06 DIAGNOSIS — R51 Headache: Secondary | ICD-10-CM

## 2012-03-06 DIAGNOSIS — E559 Vitamin D deficiency, unspecified: Secondary | ICD-10-CM

## 2012-03-06 LAB — LIPID PANEL
Cholesterol: 214 mg/dL — ABNORMAL HIGH (ref 0–200)
HDL: 47 mg/dL (ref >39)
LDL Cholesterol: 137 mg/dL — ABNORMAL HIGH (ref 0–99)
Total CHOL/HDL Ratio: 4.6 Ratio
Triglycerides: 149 mg/dL (ref <150)
VLDL: 30 mg/dL (ref 0–40)

## 2012-03-06 LAB — COMPREHENSIVE METABOLIC PANEL
ALT: 28 U/L (ref 0–35)
AST: 23 U/L (ref 0–37)
Albumin: 4.7 g/dL (ref 3.5–5.2)
Alkaline Phosphatase: 39 U/L (ref 39–117)
BUN: 15 mg/dL (ref 6–23)
CO2: 26 mEq/L (ref 19–32)
Calcium: 9.6 mg/dL (ref 8.4–10.5)
Chloride: 103 mEq/L (ref 96–112)
Creat: 0.84 mg/dL (ref 0.50–1.10)
Glucose, Bld: 84 mg/dL (ref 70–99)
Potassium: 4.7 mEq/L (ref 3.5–5.3)
Sodium: 135 mEq/L (ref 135–145)
Total Bilirubin: 0.5 mg/dL (ref 0.3–1.2)
Total Protein: 7.4 g/dL (ref 6.0–8.3)

## 2012-03-06 MED ORDER — FENOFIBRATE 160 MG PO TABS: 160.0000 mg | ORAL_TABLET | Freq: Every day | ORAL | Status: DC

## 2012-03-06 MED ORDER — SUMATRIPTAN SUCCINATE 100 MG PO TABS: 100.0000 mg | ORAL_TABLET | ORAL | Status: DC | PRN

## 2012-03-06 MED ORDER — AMLODIPINE BESY-BENAZEPRIL HCL 5-10 MG PO CAPS: 1.0000 | ORAL_CAPSULE | Freq: Every day | ORAL | Status: DC

## 2012-03-06 MED ORDER — MONTELUKAST SODIUM 10 MG PO TABS: 10.0000 mg | ORAL_TABLET | Freq: Every day | ORAL | Status: DC

## 2012-03-06 NOTE — Patient Instructions (Signed)
Take Singulair every day--might take up to 2 weeks to see its full effect. Continue the antihistamine (claritin).  Call in 2-3 weeks if allergies are still not adequately controlled, to add nasal steroid spray (ie Flonase--remember to spray AWAY from the septum).  Continue all other medications.  We will let you know lab results within 1-2 weeks.

## 2012-03-06 NOTE — Progress Notes (Signed)
Chief Complaint  Patient presents with  . Med check    fasting med check.   HPI:  Hypertension follow-up:  Blood pressures elsewhere are 120's/70's.  Denies chest pain.  Denies side effects of medications.  Continues to have some dizziness, but mainly is feeling off-balanced. No lightheadedness  Her allergies are flaring badly currently.  She didn't like the nasal steroid sprays--felt like they dried out her sinuses too much and caused pain.  Having teeth pain, sinus headaches, postnasal drip, sneezing, runny eyes.  Doing sinus rinses, and mucus is either clear or very pale green.  She did some research, and would like to be tested for lupus.  Having dry eyes and some vision problems (eyes fine per ophtho).  Complaining of aching muscles, hair loss and thinning, headaches, dizziness, sad thoughts, cognitive dysfunction, difficulting expressing thoughts, and fatigue--listed on a check-list for lupus that she printed from the internet.  Hyperlipidemia follow-up:  Patient is reportedly following a low-fat, low cholesterol diet.  Compliant with medications and denies medication side effects  Headaches: related to her neck and sinus.  Using zanaflex infrequently, last used a month ago, and using naprosyn almost daily.  Naproxen eases off the headaches, makes it tolerable.  She hasn't been having migraines lately.  Has been out of treximet, unable to afford the cost.  Neuropathy: doing okay lately.  Past Medical History  Diagnosis Date  . Anxiety panic attacks    Dr. Evelene Croon  . Hypertension   . Kidney stones h/o  . Chronic neck pain   . Chronic back pain   . Migraine    Past Surgical History  Procedure Date  . Neck surgery [name]  Summer 2011  . Back surgery Dr.Stern, last sx Summer 2011  . Tonsillectomy   . Rhinoplasty   . Neck surgery 03/2011   History   Social History  . Marital Status: Married    Spouse Name: N/A    Number of Children: N/A  . Years of Education: N/A    Occupational History  . Not on file.   Social History Main Topics  . Smoking status: Former Smoker    Quit date: 07/04/1987  . Smokeless tobacco: Never Used  . Alcohol Use: Yes     1 drink per week  . Drug Use: No  . Sexually Active: Not on file   Other Topics Concern  . Not on file   Social History Narrative  . No narrative on file   Current Outpatient Prescriptions on File Prior to Visit  Medication Sig Dispense Refill  . ALPRAZolam (XANAX) 1 MG tablet Take 0.5 mg by mouth 3 (three) times daily as needed.      Marland Kitchen amLODipine-benazepril (LOTREL) 5-10 MG per capsule Take 1 capsule by mouth daily.  30 capsule  5  . ARIPiprazole (ABILIFY) 2 MG tablet Take 2 mg by mouth daily.        . Calcium Carbonate-Vit D-Min (CALCIUM 600+D PLUS MINERALS) 600-400 MG-UNIT TABS Take 1 tablet by mouth daily.        . cholecalciferol (VITAMIN D) 1000 UNITS tablet Take 1,000 Units by mouth daily.      . DULoxetine (CYMBALTA) 30 MG capsule Take 90 mg by mouth daily.      . fenofibrate 160 MG tablet TAKE 1 TABLET (160 MG TOTAL) BY MOUTH DAILY.  30 tablet  1  . Ferrous Sulfate (IRON) 325 (65 FE) MG TABS Take 1 tablet by mouth.        Marland Kitchen  loratadine (CLARITIN) 10 MG tablet Take 10 mg by mouth daily.        . Multiple Vitamins-Minerals (MULTIVITAMIN WITH MINERALS) tablet Take 1 tablet by mouth daily.        . naproxen (NAPROSYN) 500 MG tablet Take 1 tablet (500 mg total) by mouth 2 (two) times daily with a meal. Take as needed for pain  60 tablet  1  . OMEGA 3 1000 MG CAPS Take 1 capsule by mouth daily.      Marland Kitchen tiZANidine (ZANAFLEX) 2 MG tablet Take 1-2 tablets (2-4 mg total) by mouth every 6 (six) hours as needed (muscle spasm).  30 tablet  1  . vitamin C (ASCORBIC ACID) 500 MG tablet Take 500 mg by mouth daily.        Marland Kitchen DISCONTD: amLODipine-benazepril (LOTREL) 5-10 MG per capsule TAKE 1 CAPSULE BY MOUTH DAILY.  30 capsule  1    Allergies  Allergen Reactions  . Penicillins Shortness Of Breath and Rash   . Codeine Other (See Comments)    Manic depressive  . Erythromycin Rash   ROS:  Denies headaches, shortness of breath, chest pain, GI complaints.  Denies skin rash.  See HPI  PHYSICAL EXAM: BP 120/76  Pulse 68  Ht 5' 6.5" (1.689 m)  Wt 189 lb (85.73 kg)  BMI 30.05 kg/m2  LMP 02/25/2012 Well developed, pleasant female in no distress, accompanied by husband. HEENT:  PERRL, EOMI, conjunctiva clear, TMs normal.  Nasal mucosa mildly edematous, no purulence.  Mildly tender over all sinuses Neck: diffusely tender posteriorly over muscles. No lymphadenopathy or mass Heart: regular rate and rhythm without murmur Lungs: clear bilaterally Abdomen: soft, nontender Extremities: no edema, 2+ pulses Skin: no rash Psych: appears mildly depressed, somewhat flat affect. Normal hygiene and grooming  ASSESSMENT/PLAN:  1. Essential hypertension, benign  Comprehensive metabolic panel, amLODipine-benazepril (LOTREL) 5-10 MG per capsule  2. Pure hyperglyceridemia  Comprehensive metabolic panel, Lipid panel, fenofibrate 160 MG tablet  3. Headache    4. Unspecified vitamin D deficiency  Vitamin D 25 hydroxy  5. Arthralgia  ANA w/Reflex if Positive, ANA  6. Migraine  SUMAtriptan (IMITREX) 100 MG tablet  7. Allergic rhinitis, cause unspecified  montelukast (SINGULAIR) 10 MG tablet   HTN--well controlled Lipids--labs due today.  Allergies--prefers to avoid steroid spray for now, due to intolerance in past.  Instead will try Singulair daily.  Can call for Flonase rx if inadequate control of allergies with Claritin and Singulair combination.  Discussed proper technique  Doubt lupus, but will check ANA to reassure pt.  Most of her symptoms have other explanations (allergies, depression inadequately controlled).  Encouraged her to f/u with psychiatrist if labs normal.  Check ANA, Vitamin D, c-met, lipid  (CBC and TSH done in April and were normal, for same complaints)

## 2012-03-07 ENCOUNTER — Encounter: Payer: Self-pay | Admitting: Family Medicine

## 2012-03-07 LAB — VITAMIN D 25 HYDROXY (VIT D DEFICIENCY, FRACTURES): Vit D, 25-Hydroxy: 31 ng/mL (ref 30–89)

## 2012-03-07 LAB — ANA: Anti Nuclear Antibody(ANA): NEGATIVE

## 2012-03-28 ENCOUNTER — Other Ambulatory Visit: Payer: Self-pay | Admitting: Family Medicine

## 2012-04-05 ENCOUNTER — Other Ambulatory Visit: Payer: Self-pay | Admitting: Family Medicine

## 2012-04-05 DIAGNOSIS — Z1231 Encounter for screening mammogram for malignant neoplasm of breast: Secondary | ICD-10-CM

## 2012-04-30 ENCOUNTER — Ambulatory Visit
Admission: RE | Admit: 2012-04-30 | Discharge: 2012-04-30 | Disposition: A | Discharge status: Home / Self Care | Payer: BC Managed Care – PPO | Source: Ambulatory Visit | Attending: Family Medicine | Admitting: Family Medicine

## 2012-04-30 DIAGNOSIS — Z1231 Encounter for screening mammogram for malignant neoplasm of breast: Secondary | ICD-10-CM

## 2012-05-16 ENCOUNTER — Telehealth: Payer: Self-pay | Admitting: Family Medicine

## 2012-05-16 NOTE — Telephone Encounter (Signed)
Usually OTC meds are effective--it doesn't clear if not repeated and/or nits not ALL removed.   I'd like to know names of treatments she tried, and rx that was sent for her daughter

## 2012-05-16 NOTE — Telephone Encounter (Signed)
Advise pt--I usually recommend treatment with permethrin 1% (ie Nix or Elimite).  Need to clean all bedding, bedclothes, brushes (wash/soak/dry items at [employer] [facility]), vacuum upholstered furniture, car seat, etc and bag items that can't be washed (ie stuffed animals on child's bed).  Also needs to nit pick--remove all the eggs/egg casings.  Retreatment with the shampoo is recommended in a week.  Sklice is brand new (the rep was at our office today), which is topical ivermectin.  Sounds like it needs prior auth, more expensive, but if she would like rx for herself, okay to rx, apply once, follow directions on information (left in office today by rep).

## 2012-05-16 NOTE — Telephone Encounter (Signed)
Spoke with patient-she said she tried OTC Rid, Lice Free(homeopathic) and she can't remember the name of the third one. She thinks the rx was called [facility], it is a new treatment and they are trying to get a prior auth for.

## 2012-05-17 ENCOUNTER — Other Ambulatory Visit: Payer: Self-pay

## 2012-05-17 MED ORDER — IVERMECTIN 0.5 % EX LOTN: 1.0000 | TOPICAL_LOTION | Freq: Once | CUTANEOUS | Status: DC

## 2012-05-17 NOTE — Telephone Encounter (Signed)
Sent in [place] per knapp

## 2012-05-17 NOTE — Telephone Encounter (Signed)
Pt informed word for word and request rx to [facility] in [place] called this in for pt

## 2012-06-04 ENCOUNTER — Other Ambulatory Visit (INDEPENDENT_AMBULATORY_CARE_PROVIDER_SITE_OTHER): Payer: BC Managed Care – PPO

## 2012-06-04 DIAGNOSIS — Z23 Encounter for immunization: Secondary | ICD-10-CM

## 2012-06-06 ENCOUNTER — Encounter: Payer: Self-pay | Admitting: Family Medicine

## 2012-06-06 ENCOUNTER — Ambulatory Visit (INDEPENDENT_AMBULATORY_CARE_PROVIDER_SITE_OTHER): Payer: BC Managed Care – PPO | Admitting: Family Medicine

## 2012-06-06 VITALS — BP 130/74 | HR 80 | Temp 98.1°F | Ht 66.5 in | Wt 194.0 lb

## 2012-06-06 DIAGNOSIS — M545 Low back pain, unspecified: Secondary | ICD-10-CM

## 2012-06-06 DIAGNOSIS — Z0000 Encounter for general adult medical examination without abnormal findings: Secondary | ICD-10-CM

## 2012-06-06 DIAGNOSIS — R300 Dysuria: Secondary | ICD-10-CM

## 2012-06-06 LAB — POCT URINALYSIS DIPSTICK
Bilirubin, UA: NEGATIVE
Blood, UA: NEGATIVE
Glucose, UA: NEGATIVE
Ketones, UA: NEGATIVE
Leukocytes, UA: NEGATIVE
Nitrite, UA: NEGATIVE
Protein, UA: NEGATIVE
Spec Grav, UA: 1.015
Urobilinogen, UA: NEGATIVE
pH, UA: 6

## 2012-06-06 NOTE — Patient Instructions (Signed)
Drink plenty of fluids. Call if you develop urinary urgency, frequency, fevers or other UTI symptoms that you do not want to wait for culture results (should be available Monday).  If you are taking tizanidine, you can't use cipro (there is a dangerous interaction).

## 2012-06-06 NOTE — Progress Notes (Signed)
Chief Complaint  Patient presents with  . Back Pain    in the left kidney area x 3 days. Has a squeezing, pressure almost pain when she is urinating. No increase in frequency or urgency.   HPI: After going to the bathroom (voiding and BM) 3 days ago, she started with squeezing, "gosh awful" pressure over the bladder while she was voiding.  Occurs with every void, and hasn't improved. Yesterday started with pain in her left lower back, which is also worse whenever she goes to bathroom (urinating or bowel movement). It is very painful to have a bowel movement.  Has been having regular bowel movement, formed, firm, but not hard. Denies straining.  Sometimes she just has small stools, and isn't completely emptying, but sometimes she empties well.  Denies urinary urgency or frequency.  Denies fevers, chills.  +nausea, related to the pain (she thinks), gets a wave of nausea when she goes to the bathroom.  No vomiting.  Denies diarrhea, blood in stool.  Denies blood in urine, but has intermittently been cloudy.  Denies any change in activity, injury, bending/lifitng/twisting.  Past Medical History  Diagnosis Date  . Anxiety panic attacks    Dr. Evelene Croon  . Hypertension   . Kidney stones h/o  . Chronic neck pain   . Chronic back pain   . Migraine    Past Surgical History  Procedure Date  . Neck surgery [name]  Summer 2011  . Back surgery Dr.Stern, last sx Summer 2011  . Tonsillectomy   . Rhinoplasty   . Neck surgery 03/2011   History   Social History  . Marital Status: Married    Spouse Name: N/A    Number of Children: N/A  . Years of Education: N/A   Occupational History  . Not on file.   Social History Main Topics  . Smoking status: Former Smoker    Quit date: 07/04/1987  . Smokeless tobacco: Never Used  . Alcohol Use: Yes     Comment: 1 drink per week  . Drug Use: No  . Sexually Active: Not on file   Other Topics Concern  . Not on file   Social History Narrative  . No  narrative on file    Current outpatient prescriptions:ALPRAZolam (XANAX) 1 MG tablet, Take 0.5 mg by mouth 3 (three) times daily as needed., Disp: , Rfl: ;  amLODipine-benazepril (LOTREL) 5-10 MG per capsule, Take 1 capsule by mouth daily., Disp: 30 capsule, Rfl: 5;  ARIPiprazole (ABILIFY) 2 MG tablet, Take 2 mg by mouth daily.  , Disp: , Rfl:  Calcium Carbonate-Vit D-Min (CALCIUM 600+D PLUS MINERALS) 600-400 MG-UNIT TABS, Take 1 tablet by mouth daily.  , Disp: , Rfl: ;  cholecalciferol (VITAMIN D) 1000 UNITS tablet, Take 1,000 Units by mouth daily., Disp: , Rfl: ;  DULoxetine (CYMBALTA) 30 MG capsule, Take 90 mg by mouth daily., Disp: , Rfl: ;  fenofibrate 160 MG tablet, Take 1 tablet (160 mg total) by mouth daily., Disp: 30 tablet, Rfl: 5 Ferrous Sulfate (IRON) 325 (65 FE) MG TABS, Take 1 tablet by mouth.  , Disp: , Rfl: ;  loratadine (CLARITIN) 10 MG tablet, Take 10 mg by mouth daily.  , Disp: , Rfl: ;  Multiple Vitamins-Minerals (MULTIVITAMIN WITH MINERALS) tablet, Take 1 tablet by mouth daily.  , Disp: , Rfl: ;  OMEGA 3 1000 MG CAPS, Take 1 capsule by mouth daily., Disp: , Rfl: ;  vitamin C (ASCORBIC ACID) 500 MG tablet, Take 500 mg  by mouth daily.  , Disp: , Rfl:  gabapentin (NEURONTIN) 400 MG capsule, Take 400 mg by mouth as needed., Disp: , Rfl: ;  montelukast (SINGULAIR) 10 MG tablet, Take 1 tablet (10 mg total) by mouth at bedtime., Disp: 30 tablet, Rfl: 5;  naproxen (NAPROSYN) 500 MG tablet, Take 1 tablet (500 mg total) by mouth 2 (two) times daily with a meal. Take as needed for pain, Disp: 60 tablet, Rfl: 1 SUMAtriptan (IMITREX) 100 MG tablet, Take 1 tablet (100 mg total) by mouth every 2 (two) hours as needed for migraine. (max 2 tablets/24 hours), Disp: 10 tablet, Rfl: 1;  tiZANidine (ZANAFLEX) 2 MG tablet, Take 1-2 tablets (2-4 mg total) by mouth every 6 (six) hours as needed (muscle spasm)., Disp: 30 tablet, Rfl: 1;  [DISCONTINUED] amLODipine-benazepril (LOTREL) 5-10 MG per capsule, TAKE 1  CAPSULE BY MOUTH DAILY., Disp: 30 capsule, Rfl: 5  Allergies  Allergen Reactions  . Penicillins Shortness Of Breath and Rash  . Codeine Other (See Comments)    Manic depressive  . Erythromycin Rash   ROS:  Denies fevers, chest pain, palpitations, shortness of breath, cough, URI symptoms.  See HPI for details.  Denies bleeding/bruising  PHYSICAL EXAM: BP 130/74  Pulse 80  Temp 98.1 F (36.7 C) (Oral)  Ht 5' 6.5" (1.689 m)  Wt 194 lb (87.998 kg)  BMI 30.84 kg/m2  LMP 05/13/2012 Pleasant female, who is sitting very stiffly, appearing mildly uncomfortable Heart: regular rate and rhythm without murmur Lungs: clear bilaterally Abdomen: soft.  +suprapubic tenderness to palpation. no masses Back: WHSS.  Spine nontender, CVA slightly tender on left, but really an extension of the muscle tenderness.  No SI joint tenderness.  She has spasm and tenderness of L lumbar paraspinous muscles Extremities: no edema Skin: no rashes Psych: normal mood  Urine dip normal  ASSESSMENT/PLAN: 1. Dysuria  Urine culture  2. Routine general medical examination at a health care facility  POCT Urinalysis Dipstick  3. Lumbago      L sided LBP--due to muscle spasm.  Heat, stretches, massage. Restart muscle relaxants Suprapubic pain--seems consistent with bladder pain.  No evidence of infection, ? Spasm.  Send urine for culture to r/o infection.  If she develops urgency, frequency, fevers, can call to get started on ABX while awaiting culture, but otherwise prefers to wait for culture results.  Drink plenty of fluids.  Reviewed differential diagnosis, including kidney stone.  No blood on dip.  Has tizanidine at home for neck muscle spasms--will try for her low back.   Do NOT use Cipro due to tizanidine use.  If NEED to use Cipro (based on sensitivity) then will need to change muscle relaxant.  Pt aware, and aware to advise others (providers on call, urgent care, etc) of her use of tizanidine and need to avoid  cipro (or change tizanidine)  We will f/u by phone with culture results next week

## 2012-06-07 ENCOUNTER — Encounter: Payer: Self-pay | Admitting: Family Medicine

## 2012-06-11 ENCOUNTER — Other Ambulatory Visit: Payer: Self-pay | Admitting: *Deleted

## 2012-06-11 LAB — URINE CULTURE: Colony Count: 55000

## 2012-06-11 MED ORDER — NITROFURANTOIN MONOHYD MACRO 100 MG PO CAPS: 100.0000 mg | ORAL_CAPSULE | Freq: Two times a day (BID) | ORAL | Status: DC

## 2012-09-05 ENCOUNTER — Ambulatory Visit (INDEPENDENT_AMBULATORY_CARE_PROVIDER_SITE_OTHER): Payer: Medicare Other | Admitting: Family Medicine

## 2012-09-05 ENCOUNTER — Encounter: Payer: Self-pay | Admitting: Family Medicine

## 2012-09-05 VITALS — BP 122/72 | HR 72 | Ht 66.5 in | Wt 191.0 lb

## 2012-09-05 DIAGNOSIS — R5381 Other malaise: Secondary | ICD-10-CM

## 2012-09-05 DIAGNOSIS — I10 Essential (primary) hypertension: Secondary | ICD-10-CM

## 2012-09-05 DIAGNOSIS — R5383 Other fatigue: Secondary | ICD-10-CM

## 2012-09-05 DIAGNOSIS — G43909 Migraine, unspecified, not intractable, without status migrainosus: Secondary | ICD-10-CM

## 2012-09-05 DIAGNOSIS — R51 Headache: Secondary | ICD-10-CM

## 2012-09-05 DIAGNOSIS — E781 Pure hyperglyceridemia: Secondary | ICD-10-CM

## 2012-09-05 DIAGNOSIS — Z23 Encounter for immunization: Secondary | ICD-10-CM

## 2012-09-05 DIAGNOSIS — G589 Mononeuropathy, unspecified: Secondary | ICD-10-CM

## 2012-09-05 DIAGNOSIS — L659 Nonscarring hair loss, unspecified: Secondary | ICD-10-CM

## 2012-09-05 DIAGNOSIS — G629 Polyneuropathy, unspecified: Secondary | ICD-10-CM

## 2012-09-05 DIAGNOSIS — E559 Vitamin D deficiency, unspecified: Secondary | ICD-10-CM

## 2012-09-05 LAB — CBC WITH DIFFERENTIAL/PLATELET
Basophils Absolute: 0.1 10*3/uL (ref 0.0–0.1)
Basophils Relative: 1 % (ref 0–1)
Eosinophils Absolute: 0.2 10*3/uL (ref 0.0–0.7)
Eosinophils Relative: 2 % (ref 0–5)
HCT: 37.6 % (ref 36.0–46.0)
Hemoglobin: 12.9 g/dL (ref 12.0–15.0)
Lymphocytes Relative: 37 % (ref 12–46)
Lymphs Abs: 3 10*3/uL (ref 0.7–4.0)
MCH: 31.6 pg (ref 26.0–34.0)
MCHC: 34.3 g/dL (ref 30.0–36.0)
MCV: 92.2 fL (ref 78.0–100.0)
Monocytes Absolute: 0.5 10*3/uL (ref 0.1–1.0)
Monocytes Relative: 6 % (ref 3–12)
Neutro Abs: 4.3 10*3/uL (ref 1.7–7.7)
Neutrophils Relative %: 54 % (ref 43–77)
Platelets: 386 10*3/uL (ref 150–400)
RBC: 4.08 MIL/uL (ref 3.87–5.11)
RDW: 13.1 % (ref 11.5–15.5)
WBC: 8 10*3/uL (ref 4.0–10.5)

## 2012-09-05 LAB — COMPREHENSIVE METABOLIC PANEL
ALT: 29 U/L (ref 0–35)
AST: 23 U/L (ref 0–37)
Albumin: 4.7 g/dL (ref 3.5–5.2)
Alkaline Phosphatase: 41 U/L (ref 39–117)
BUN: 18 mg/dL (ref 6–23)
CO2: 24 mEq/L (ref 19–32)
Calcium: 10.1 mg/dL (ref 8.4–10.5)
Chloride: 102 mEq/L (ref 96–112)
Creat: 0.94 mg/dL (ref 0.50–1.10)
Glucose, Bld: 99 mg/dL (ref 70–99)
Potassium: 4.6 mEq/L (ref 3.5–5.3)
Sodium: 136 mEq/L (ref 135–145)
Total Bilirubin: 0.4 mg/dL (ref 0.3–1.2)
Total Protein: 7.7 g/dL (ref 6.0–8.3)

## 2012-09-05 LAB — LIPID PANEL
Cholesterol: 213 mg/dL — ABNORMAL HIGH (ref 0–200)
HDL: 45 mg/dL (ref >39)
LDL Cholesterol: 128 mg/dL — ABNORMAL HIGH (ref 0–99)
Total CHOL/HDL Ratio: 4.7 Ratio
Triglycerides: 201 mg/dL — ABNORMAL HIGH (ref <150)
VLDL: 40 mg/dL (ref 0–40)

## 2012-09-05 LAB — TSH: TSH: 1.414 u[IU]/mL (ref 0.350–4.500)

## 2012-09-05 MED ORDER — KETOROLAC TROMETHAMINE 60 MG/2ML IM SOLN
60.0000 mg | Freq: Once | INTRAMUSCULAR | Status: AC
  Administered 2012-09-05: 60 mg via INTRAMUSCULAR

## 2012-09-05 MED ORDER — AMLODIPINE BESY-BENAZEPRIL HCL 5-10 MG PO CAPS: 1.0000 | ORAL_CAPSULE | Freq: Every day | ORAL | Status: DC

## 2012-09-05 MED ORDER — FENOFIBRATE 160 MG PO TABS: 160.0000 mg | ORAL_TABLET | Freq: Every day | ORAL | Status: DC

## 2012-09-05 MED ORDER — GABAPENTIN 400 MG PO CAPS: 400.0000 mg | ORAL_CAPSULE | Freq: Three times a day (TID) | ORAL | Status: DC

## 2012-09-05 MED ORDER — ELETRIPTAN HYDROBROMIDE 40 MG PO TABS: ORAL_TABLET | ORAL | Status: DC

## 2012-09-05 NOTE — Patient Instructions (Addendum)
Restart the gabapentin, as this was likely helpful in reducing your headaches.  Start at 1 at bedtime, increase to twice daily, then can further increase to 1 in the morning, and 2 at bedtime if headaches aren't improved.  Try the Relpax in place of the imitrex to see if more effective.  If not, can call for prescription for Frova as we discussed.  Try and exercise daily.  Call to arrange for GYN exam given your heavy/frequent menses.  If you bloodwork shows anemia, I will send results to your GYN (so we will call to see who you scheduled with).  Return in 6 months, sooner if ongoing problems with headaches or other concerns

## 2012-09-05 NOTE — Progress Notes (Signed)
Chief Complaint  Patient presents with  . Med check    fasting med check. Has been fighting a headache x 4 days-has been having increased HA's lately. Took Imitrex yesterday with no relief.   Hypertension follow-up: Blood pressures elsewhere are 120's/70's. Denies chest pain. Denies side effects of medications. Denies further feelings of off-balance.  Having some light-headedness and nausea associated with her headaches.  Exercise has been limited due to bad weather.  Hyperlipidemia follow-up: Patient is reportedly following a low-fat, low cholesterol diet. Compliant with medications (fenofibrate) and denies medication side effects   Neuropathy:  She stopped taking the gabapentin x 3 months, as she didn't notice much benefit in treating neuropathy.  Having tingling, numbness, and feeling both cold and burning in her feet.  Didn't get any worse since stopping the gabapentin, but headaches have gotten worse. She stopped it because she was feeling too groggy/sedated, but realizes that her fatigue persists, not really improved since stopping the gabapentin.  Headaches:  A lot are at the base of her skull (posterior neck), and also at the top of her skull.  Headaches have recurred x 2 months, almost daily.  Excedrin Migraine, Tylenol, Advil and Naproxen have been tried, but only very short-term relief.  Also using Zanaflex almost daily, with short-term benefit.  She has also taken Imitrex, which has helped on a couple of occasions, but not yesterday.  Has a current headache, ongoing x 4 days.  Headache at top of head and base of skull.  Denies any allergy/congestion.  Denies any memory concerns, speech, focal weakness or other new neuro symptoms.  imitrex hasn't been as effective, asking for new triptan med.  Past Medical History  Diagnosis Date  . Anxiety panic attacks    Dr. Evelene Croon  . Hypertension   . Kidney stones h/o  . Chronic neck pain   . Chronic back pain   . Migraine    Past Surgical History   Procedure Laterality Date  . Neck surgery  [name]  Summer 2011  . Back surgery  Dr.Stern, last sx Summer 2011  . Tonsillectomy    . Rhinoplasty    . Neck surgery  03/2011   History   Social History  . Marital Status: Married    Spouse Name: N/A    Number of Children: N/A  . Years of Education: N/A   Occupational History  . Not on file.   Social History Main Topics  . Smoking status: Former Smoker    Quit date: 07/04/1987  . Smokeless tobacco: Never Used  . Alcohol Use: Yes     Comment: 1 drink per week  . Drug Use: No  . Sexually Active: Not on file   Other Topics Concern  . Not on file   Social History Narrative  . No narrative on file   Current Outpatient Prescriptions on File Prior to Visit  Medication Sig Dispense Refill  . ALPRAZolam (XANAX) 1 MG tablet Take 0.5 mg by mouth 3 (three) times daily as needed.      . ARIPiprazole (ABILIFY) 2 MG tablet Take 2 mg by mouth daily.        . Calcium Carbonate-Vit D-Min (CALCIUM 600+D PLUS MINERALS) 600-400 MG-UNIT TABS Take 1 tablet by mouth daily.        . cholecalciferol (VITAMIN D) 1000 UNITS tablet Take 1,000 Units by mouth daily.      . DULoxetine (CYMBALTA) 30 MG capsule Take 90 mg by mouth daily.      Marland Kitchen  Ferrous Sulfate (IRON) 325 (65 FE) MG TABS Take 1 tablet by mouth.        . loratadine (CLARITIN) 10 MG tablet Take 10 mg by mouth daily.        . Multiple Vitamins-Minerals (MULTIVITAMIN WITH MINERALS) tablet Take 1 tablet by mouth daily.        . naproxen (NAPROSYN) 500 MG tablet Take 1 tablet (500 mg total) by mouth 2 (two) times daily with a meal. Take as needed for pain  60 tablet  1  . OMEGA 3 1000 MG CAPS Take 1 capsule by mouth daily.      . SUMAtriptan (IMITREX) 100 MG tablet Take 1 tablet (100 mg total) by mouth every 2 (two) hours as needed for migraine. (max 2 tablets/24 hours)  10 tablet  1  . tiZANidine (ZANAFLEX) 2 MG tablet Take 1-2 tablets (2-4 mg total) by mouth every 6 (six) hours as needed (muscle  spasm).  30 tablet  1  . vitamin C (ASCORBIC ACID) 500 MG tablet Take 500 mg by mouth daily.         No current facility-administered medications on file prior to visit.   Allergies  Allergen Reactions  . Penicillins Shortness Of Breath and Rash  . Codeine Other (See Comments)    Manic depressive  . Erythromycin Rash   ROS: Denies fevers, URI symptoms, allergies.  +cough first thing in the morning from PND.  +worsening hair loss over the last few months (per hairdresser), fatigue (chronic), dry skin, constipation. Anxiety is overall improved, but has certain situations where it is still out of control.  Sees Dr. Evelene Croon +heavy periods, a little more frequent than previous.  Takes iron daily.  Past due for pap.  Had ablation in past, which was ineffective.  PHYSICAL EXAM: BP 122/72  Pulse 72  Ht 5' 6.5" (1.689 m)  Wt 191 lb (86.637 kg)  BMI 30.37 kg/m2 Well developed female, wearing dark glasses due to headache.  She is speaking somewhat slowly, but otherwise in no significant distress HEENT:  PERRL, EOMI, conjunctiva and fundi clear.  TM's and EAC's normal.  Nasal mucosa with mild edema, no purulence.  Sinuses nontender Neck: no lymphadenopathy, thyromegaly or mass Back: no spine tenderness, CVA tenderness, no cervical tenderness or muscle spasm Abdomen: soft, nontender, no organomegaly or mass Extremities: no edema Skin: no bleeding/bruising Neuro: alert and oriented.  Cranial nerves intact.  Strength 5/5, normal sensation, DTR's symmetric.  Normal finger to nose, gait Psych: flat affect.  Normal hygiene grooming  ASSESSMENT/PLAN:  Essential hypertension, benign - Plan: Comprehensive metabolic panel, amLODipine-benazepril (LOTREL) 5-10 MG per capsule  Headache - Plan: gabapentin (NEURONTIN) 400 MG capsule, ketorolac (TORADOL) injection 60 mg  Pure hyperglyceridemia - check labs today. continue lowfat, low cholesterol diet - Plan: Comprehensive metabolic panel, Lipid panel,  fenofibrate 160 MG tablet  Neuropathy - Plan: gabapentin (NEURONTIN) 400 MG capsule  Unspecified vitamin D deficiency - last lab WNL (lower limit), continue supplementation.  recheck 6 months  Other malaise and fatigue - Plan: TSH, CBC with Differential  Hair loss - Plan: TSH  Need for Tdap vaccination - Plan: Tdap vaccine greater than or equal to 7yo IM  Migraine headache - Plan: eletriptan (RELPAX) 40 MG tablet  Tdap today  Schedule GYN exam with gynecologist  Headaches--trial of Relpax samples.  Seems to be worse since stopping the gabapentin, so will restart.  Neuropathy may not improve, but hopefully the frequency of headaches will decrease back to where  they were. Consider Frova trial if Relpax only giving temporary/short-lived relief. Neuropathy--tolerable.  No benefit from the combination of neurontin and cymbalta.  Restart the neurontin, since headaches may have been improved on it.  Discussed possiblity of Lyrica, but prefers to retry gabapentin instead.  Discussed 300 vs 400mg --willing to restart at same dose. Discussed using 400 in BID and consider increasing to 800mg  at bedtime (rather than 400mg  TID). HTN--controlled  50 minute visit, more than 1/2 spent counseling.

## 2012-09-06 ENCOUNTER — Encounter: Payer: Self-pay | Admitting: Family Medicine

## 2012-10-11 ENCOUNTER — Other Ambulatory Visit: Payer: Self-pay | Admitting: Obstetrics and Gynecology

## 2012-12-05 ENCOUNTER — Encounter: Payer: Self-pay | Admitting: Family Medicine

## 2013-01-01 ENCOUNTER — Encounter: Payer: Self-pay | Admitting: Internal Medicine

## 2013-01-01 LAB — HM COLONOSCOPY

## 2013-01-20 ENCOUNTER — Other Ambulatory Visit: Payer: Self-pay | Admitting: Family Medicine

## 2013-01-20 NOTE — Telephone Encounter (Signed)
Is this okay to refill? 

## 2013-01-20 NOTE — Telephone Encounter (Signed)
Okay #30, no refill 

## 2013-01-27 ENCOUNTER — Ambulatory Visit (INDEPENDENT_AMBULATORY_CARE_PROVIDER_SITE_OTHER): Payer: Medicare Other | Admitting: Family Medicine

## 2013-01-27 ENCOUNTER — Encounter: Payer: Self-pay | Admitting: Family Medicine

## 2013-01-27 VITALS — BP 112/78 | HR 92 | Ht 66.0 in | Wt 197.0 lb

## 2013-01-27 DIAGNOSIS — G589 Mononeuropathy, unspecified: Secondary | ICD-10-CM

## 2013-01-27 DIAGNOSIS — R51 Headache: Secondary | ICD-10-CM

## 2013-01-27 DIAGNOSIS — R7309 Other abnormal glucose: Secondary | ICD-10-CM

## 2013-01-27 DIAGNOSIS — G629 Polyneuropathy, unspecified: Secondary | ICD-10-CM

## 2013-01-27 LAB — POCT GLYCOSYLATED HEMOGLOBIN (HGB A1C): Hemoglobin A1C: 5.5

## 2013-01-27 MED ORDER — GABAPENTIN 400 MG PO CAPS: 400.0000 mg | ORAL_CAPSULE | Freq: Three times a day (TID) | ORAL | Status: DC

## 2013-01-27 NOTE — Patient Instructions (Addendum)
At this point, since Cymbalta was recently increased by psychiatrist, and this is an effective drug for neuropathy, let's give it more time at this dose (it already seems to be helping some).  Let's wait at least 4-5 weeks to see if there is any improvement in the neuropathy.  Continue the neurontin for now.  If no significant improvement in the neuropathy, then can taper off neurontin, and start the lyrica.  Patient can call for samples to try.  Other option that we discussed was referral to neurology for further evaluation, may need EMG/NCV and/or further lab evaluation.  Let us know if you want referral rather than trial of Lyrica if no improvement with higher cymbalta dose.

## 2013-01-27 NOTE — Progress Notes (Signed)
Chief Complaint  Patient presents with  . Advice Only    over the past 6 months the neuropathy in her feet has worsened but over the last month been really severe.    Last week the neuropathy pain was severe, hurt to even put her foot on the floor.  Pain is described as pins and needles, a "cold burn".  This week she is still having pain, but less burning.  It feels more like it is waking up from being asleep, not as much burning in nature.  Last week was 9/10 pain, and currently is 2/10 (at office visit).  Pain is worse in the evenings, and last night was 6-7/10.  Per last visit:: She had stopped taking the gabapentin x 3 months, as she didn't notice much benefit in treating neuropathy. Having tingling, numbness, and feeling both cold and burning in her feet. Didn't get any worse since stopping the gabapentin, but headaches had gotten worse. She stopped it because she was feeling too groggy/sedated, but realizes that her fatigue persists, not really improved since stopping the gabapentin.  Since last visit she restarted the gabapentin. She cannot take it during the day due to sedating side effect, so has only been taking it at bedtime, 800mg  qHS.  Not having any grogginess with just evening dosing.  Headaches haven't gotten better since restarting the neurontin, nor is there any benefit in the neuropathy.  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 Getting headaches at least twice a week.  She has taken Imitrex and Relpax.  The Relpax works well, but is very expensive.  imitrex along with naproxen seems to work okay if caught early.  Dr. Evelene Croon increased her Cymbalta dose to 60mg  twice daily last week, due to depression being poorly controlled.  Denies side effects since dose increase.  As mentioned above, she has had some decrease in her neuropathy pain since last week  Past Medical History  Diagnosis Date  . Anxiety panic attacks    Dr. Evelene Croon   . Hypertension   . Kidney stones h/o  . Chronic neck pain   . Chronic back pain   . Migraine    Past Surgical History  Procedure Laterality Date  . Neck surgery  [name]  Summer 2011  . Back surgery  Dr.Stern, last sx Summer 2011  . Tonsillectomy    . Rhinoplasty    . Neck surgery  03/2011   History   Social History  . Marital Status: Married    Spouse Name: N/A    Number of Children: N/A  . Years of Education: N/A   Occupational History  . Not on file.   Social History Main Topics  . Smoking status: Former Smoker    Quit date: 07/04/1987  . Smokeless tobacco: Never Used  . Alcohol Use: Yes     Comment: 0-1 drink per week  . Drug Use: No  . Sexually Active: Not on file   Other Topics Concern  . Not on file   Social History Narrative  . No narrative on file   Current outpatient prescriptions:ALPRAZolam (XANAX) 1 MG tablet, Take 0.5 mg by mouth 3 (three) times daily as needed., Disp: , Rfl: ;  amLODipine-benazepril (LOTREL) 5-10 MG per capsule, Take 1 capsule by mouth daily., Disp: 30 capsule, Rfl: 5;  ARIPiprazole (ABILIFY) 2 MG tablet, Take 2 mg by mouth daily.  , Disp: , Rfl:  Calcium Carbonate-Vit D-Min (CALCIUM 600+D PLUS  MINERALS) 600-400 MG-UNIT TABS, Take 1 tablet by mouth daily.  , Disp: , Rfl: ;  cholecalciferol (VITAMIN D) 1000 UNITS tablet, Take 1,000 Units by mouth daily., Disp: , Rfl: ;  DULoxetine (CYMBALTA) 60 MG capsule, Take 60 mg by mouth 2 (two) times daily., Disp: , Rfl: ;  fenofibrate 160 MG tablet, Take 1 tablet (160 mg total) by mouth daily., Disp: 30 tablet, Rfl: 5 Ferrous Sulfate (IRON) 325 (65 FE) MG TABS, Take 1 tablet by mouth.  , Disp: , Rfl: ;  gabapentin (NEURONTIN) 400 MG capsule, Take 1 capsule (400 mg total) by mouth 3 (three) times daily., Disp: 90 capsule, Rfl: 5;  Multiple Vitamins-Minerals (MULTIVITAMIN WITH MINERALS) tablet, Take 1 tablet by mouth daily.  , Disp: , Rfl: ;  Omega-3 Fatty Acids (FISH OIL) 1200 MG CPDR, Take 1,200 mg by  mouth daily., Disp: , Rfl:  tiZANidine (ZANAFLEX) 2 MG tablet, TAKE 1-2 TABLETS (2-4 MG TOTAL) BY MOUTH EVERY 6 (SIX) HOURS AS NEEDED (MUSCLE SPASM)., Disp: 30 tablet, Rfl: 0;  vitamin C (ASCORBIC ACID) 500 MG tablet, Take 500 mg by mouth daily.  , Disp: , Rfl: ;  eletriptan (RELPAX) 40 MG tablet, One tablet by mouth at onset of headache. May repeat in 2 hours if headache persists or recurs. Max 80mg /24 hours. Take 1/2-1 tablet, Disp: 5 tablet, Rfl: 0 loratadine (CLARITIN) 10 MG tablet, Take 10 mg by mouth daily.  , Disp: , Rfl: ;  SUMAtriptan (IMITREX) 100 MG tablet, Take 1 tablet (100 mg total) by mouth every 2 (two) hours as needed for migraine. (max 2 tablets/24 hours), Disp: 10 tablet, Rfl: 1  Allergies  Allergen Reactions  . Penicillins Shortness Of Breath and Rash  . Codeine Other (See Comments)    Manic depressive  . Erythromycin Rash   ROS:  Denies fevers, nausea, vomiting, bleeding/bruising, rash, chest pain.  +depression, headaches as per HPI.  Denies polydipsia, polyuria.  Denies weakness.  No URI symptoms or other complaints.  PHYSICAL EXAM: BP 112/78  Pulse 92  Ht 5\' 6"  (1.676 m)  Wt 197 lb (89.359 kg)  BMI 31.81 kg/m2  LMP 12/16/2012 Well developed, pleasant female in no apparent distress Exam limited to discussion, previously with normal exam for same complaints  ASSESSMENT/PLAN:  Neuropathy - Plan: gabapentin (NEURONTIN) 400 MG capsule  Headache(784.0) - Plan: gabapentin (NEURONTIN) 400 MG capsule  Other abnormal glucose - Plan: HgB A1c  At this point, since Cymbalta was recently increased by psychiatrist, and this is an effective drug for neuropathy, let's give it more time at this dose. She seems to be tolerating the higher dose without side effects.  Let's wait at least 4-5 weeks to see if there is any improvement in the neuropathy--it appears that she has already noticed some improvement.  Continue the neurontin for now.  If no significant improvement in the  neuropathy, then can taper off neurontin, and start the lyrica.  Patient can call for samples to try.  Other option that we discussed was referral to neurology for further evaluation, may need EMG/NCV and/or further lab evaluation.  She will let us know if she desires referral rather than trial of Lyrica if no improvement with higher cymbalta dose.  Reviewed lyrica in detail, including potential drug interactions (mainly intensifying CNS depressant effects of her meds including muscle relaxants, benzo and abilify).  Would start at 50-75mg  BID, and increase as needed.  Cost might be an issue due to her being on medicare and not able  to use copay cards.

## 2013-03-10 ENCOUNTER — Encounter: Payer: Self-pay | Admitting: Family Medicine

## 2013-03-10 ENCOUNTER — Ambulatory Visit (INDEPENDENT_AMBULATORY_CARE_PROVIDER_SITE_OTHER): Payer: Medicare Other | Admitting: Family Medicine

## 2013-03-10 VITALS — BP 118/80 | HR 76 | Ht 66.0 in | Wt 195.0 lb

## 2013-03-10 DIAGNOSIS — E781 Pure hyperglyceridemia: Secondary | ICD-10-CM

## 2013-03-10 DIAGNOSIS — R51 Headache: Secondary | ICD-10-CM

## 2013-03-10 DIAGNOSIS — I10 Essential (primary) hypertension: Secondary | ICD-10-CM

## 2013-03-10 DIAGNOSIS — Z23 Encounter for immunization: Secondary | ICD-10-CM

## 2013-03-10 DIAGNOSIS — G43909 Migraine, unspecified, not intractable, without status migrainosus: Secondary | ICD-10-CM

## 2013-03-10 DIAGNOSIS — G589 Mononeuropathy, unspecified: Secondary | ICD-10-CM

## 2013-03-10 DIAGNOSIS — G629 Polyneuropathy, unspecified: Secondary | ICD-10-CM

## 2013-03-10 DIAGNOSIS — Z79899 Other long term (current) drug therapy: Secondary | ICD-10-CM

## 2013-03-10 LAB — COMPREHENSIVE METABOLIC PANEL
ALT: 19 U/L (ref 0–35)
AST: 16 U/L (ref 0–37)
Albumin: 4.6 g/dL (ref 3.5–5.2)
Alkaline Phosphatase: 39 U/L (ref 39–117)
BUN: 11 mg/dL (ref 6–23)
CO2: 25 mEq/L (ref 19–32)
Calcium: 9.7 mg/dL (ref 8.4–10.5)
Chloride: 106 mEq/L (ref 96–112)
Creat: 0.87 mg/dL (ref 0.50–1.10)
Glucose, Bld: 80 mg/dL (ref 70–99)
Potassium: 5 mEq/L (ref 3.5–5.3)
Sodium: 138 mEq/L (ref 135–145)
Total Bilirubin: 0.4 mg/dL (ref 0.3–1.2)
Total Protein: 7.5 g/dL (ref 6.0–8.3)

## 2013-03-10 LAB — LIPID PANEL
Cholesterol: 172 mg/dL (ref 0–200)
HDL: 40 mg/dL (ref >39)
LDL Cholesterol: 103 mg/dL — ABNORMAL HIGH (ref 0–99)
Total CHOL/HDL Ratio: 4.3 Ratio
Triglycerides: 145 mg/dL (ref <150)
VLDL: 29 mg/dL (ref 0–40)

## 2013-03-10 MED ORDER — FENOFIBRATE 160 MG PO TABS: 160.0000 mg | ORAL_TABLET | Freq: Every day | ORAL | Status: DC

## 2013-03-10 MED ORDER — AMLODIPINE BESY-BENAZEPRIL HCL 5-10 MG PO CAPS: 1.0000 | ORAL_CAPSULE | Freq: Every day | ORAL | Status: DC

## 2013-03-10 NOTE — Progress Notes (Signed)
Chief Complaint  Patient presents with  . Med check    fasting med check.   Patient presents for fasting med check, follow-up on her chronic medical problems.  She has no specific complaints today.  Hypertension follow-up:  Blood pressures elsewhere are 115-125/75-85.  Denies dizziness, chest pain, edema.  Denies side effects of medications.  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  Seems to be improved since Cymbalta dose was increased.  More symptomatic in the evenings, but not as severe, just 4/10 at bedtime.  She has continued on gabapentin 800 mg qHS (never changed to Lyrica, as discussed as a possibility--didn't need to because improved).  This is improved and tolerable.  Headaches/migraines:  Getting headaches at least once a week, related to her neck.  If caught early (prior to turning into migraine) will use naproxen, zanaflex and ice pack to neck, which often helps.  Turns into full migraine about once every 3 weeks.  She uses Imitrex or Relpax for treatment of migraines. The Relpax works well, but is very expensive. imitrex along with naproxen seems to work okay if caught early.  Hyperlipidemia follow-up:  Patient is reportedly following a low-fat, low cholesterol diet.  Compliant with medications and denies medication side effects. She never increased the fish oil, still taking just one daily.  She isn't able to get much exercise due to extreme fatigue.  She had seen Dr. Loreta Ave for bloating and constipation.  She had celiac panel done which was normal.  She took her off the calcium and iron and was started on a probiotic.   Past Medical History  Diagnosis Date  . Anxiety panic attacks    Dr. Evelene Croon  . Hypertension   . Kidney stones h/o  . Chronic neck pain   . Chronic back pain   . Migraine     Past Surgical History  Procedure Laterality Date  . Neck surgery  [name]  Summer 2011  . Back surgery  Dr.Stern, last sx Summer  2011  . Tonsillectomy    . Rhinoplasty    . Neck surgery  03/2011    History   Social History  . Marital Status: Married    Spouse Name: N/A    Number of Children: N/A  . Years of Education: N/A   Occupational History  . Not on file.   Social History Main Topics  . Smoking status: Former Smoker    Quit date: 07/04/1987  . Smokeless tobacco: Never Used  . Alcohol Use: Yes     Comment: 0-1 drink per week  . Drug Use: No  . Sexual Activity: Yes    Partners: Male    Birth Control/ Protection: Other-see comments     Comment: husband had vasectomy   Other Topics Concern  . Not on file   Social History Narrative   Married.  Lives with husband, 1 daughter, father-in-law, brother-in-law, 2 dogs.   2 other children live locally. Disabled due to anxiety.  Previously worked as Psychologist, forensic.    Family History  Problem Relation Age of Onset  . Hypertension Mother   . Depression Mother   . Migraines Mother   . COPD Father   . Heart failure Father   . Breast cancer Cousin   . Cancer Cousin   . Diabetes Neg Hx     Current outpatient prescriptions:ALPRAZolam (XANAX) 1 MG tablet, Take 0.5 mg by mouth 3 (three) times daily as needed., Disp: ,  Rfl: ;  amLODipine-benazepril (LOTREL) 5-10 MG per capsule, Take 1 capsule by mouth daily., Disp: 30 capsule, Rfl: 5;  ARIPiprazole (ABILIFY) 2 MG tablet, Take 2 mg by mouth daily.  , Disp: , Rfl: ;  DULoxetine (CYMBALTA) 60 MG capsule, Take 60 mg by mouth 2 (two) times daily., Disp: , Rfl:  fenofibrate 160 MG tablet, Take 1 tablet (160 mg total) by mouth daily., Disp: 30 tablet, Rfl: 5;  gabapentin (NEURONTIN) 400 MG capsule, Take 800 mg by mouth at bedtime., Disp: , Rfl: ;  JINTELI 1-5 MG-MCG TABS, Take 1 tablet by mouth daily., Disp: , Rfl: ;  LORazepam (ATIVAN) 0.5 MG tablet, Take 1-2 tablets by mouth 3 (three) times daily as needed. Prn anxiety, Disp: , Rfl:  Multiple Vitamins-Minerals (MULTIVITAMIN WITH MINERALS) tablet, Take 1 tablet  by mouth daily.  , Disp: , Rfl: ;  Omega-3 Fatty Acids (FISH OIL) 1200 MG CPDR, Take 1,200 mg by mouth daily., Disp: , Rfl: ;  tiZANidine (ZANAFLEX) 2 MG tablet, TAKE 1-2 TABLETS (2-4 MG TOTAL) BY MOUTH EVERY 6 (SIX) HOURS AS NEEDED (MUSCLE SPASM)., Disp: 30 tablet, Rfl: 0 eletriptan (RELPAX) 40 MG tablet, One tablet by mouth at onset of headache. May repeat in 2 hours if headache persists or recurs. Max 80mg /24 hours. Take 1/2-1 tablet, Disp: 5 tablet, Rfl: 0;  loratadine (CLARITIN) 10 MG tablet, Take 10 mg by mouth daily.  , Disp: , Rfl: ;  SUMAtriptan (IMITREX) 100 MG tablet, Take 1 tablet (100 mg total) by mouth every 2 (two) hours as needed for migraine. (max 2 tablets/24 hours), Disp: 10 tablet, Rfl: 1  Allergies  Allergen Reactions  . Penicillins Shortness Of Breath and Rash  . Codeine Other (See Comments)    Manic depressive  . Erythromycin Rash   Immunization History  Administered Date(s) Administered  . Influenza Split 03/18/2011  . Influenza Whole 07/03/2009  . Influenza, Seasonal, Injecte, Preservative Fre 06/04/2012  . Influenza,inj,Quad PF,36+ Mos 03/10/2013  . Tdap 09/05/2012   ROS:  Denies fevers, chills, nausea, vomiting, diarrhea, skin rashes, bleeding/bruising, urinary complaints, URI symptoms.  Mild congestion/allergies, but overall allergies are well controlled with claritin.  Denies cough, shortness of breath, wheezing.  No joint pains other than neck/back.  Moods are somewhat improved since med changes, feels like she is coping better.  PHYSICAL EXAM: BP 118/80  Pulse 76  Ht 5\' 6"  (1.676 m)  Wt 195 lb (88.451 kg)  BMI 31.49 kg/m2  LMP 12/16/2012 Well developed female, pleasant, in no distress HEENT: PERRL, EOMI, conjunctiva clear. Neck: no lymphadenopathy, thyromegaly or mass; no carotid bruit.  Some tenderness at C7 and surrounding area, overlying scar.  No muscle spasm or tenderness today Back: no spine tenderness, CVA tenderness Abdomen: soft, nontender, no  organomegaly or mass  Extremities: no edema, 2+ pulses Skin: no bleeding/bruising  Neuro: alert and oriented. Cranial nerves intact. Strength 5/5, normal sensation, DTR's symmetric. Normal gait  Psych:Normal hygiene, grooming, mood and fuller range of affect today   ASSESSMENT/PLAN:  Essential hypertension, benign - well controlled - Plan: Comprehensive metabolic panel, amLODipine-benazepril (LOTREL) 5-10 MG per capsule  Need for prophylactic vaccination and inoculation against influenza - Plan: Flu Vaccine QUAD 36+ mos IM  Pure hyperglyceridemia - due for labs - Plan: Comprehensive metabolic panel, Lipid panel, fenofibrate 160 MG tablet  Migraine headache - infrequent/stable.  will call when refills needed  Headache - stable/manageable with current regimen.  will have pharmacy call when zanaflex refill is needed.  Neuropathy -  improved.  Continue current regimen  Encounter for long-term (current) use of other medications - Plan: Comprehensive metabolic panel  Add in exercise, even if just 3 ten minute intervals daily. Ensure getting 1000 IU daily of Vitamin D3.  If not enough is in your multivitamin, you can take a separate D3.  F/u 6 months for fasting med check, sooner prn

## 2013-03-10 NOTE — Patient Instructions (Signed)
Ensure getting 1000 IU daily of Vitamin D3.  If not enough is in your multivitamin, you can take a separate D3.  It is recommended that you get at least 30 minutes of aerobic exercise at least 5 days/week (for weight loss, you may need as much as 60-90 minutes). This can be any activity that gets your heart rate up. This can be divided in 10-15 minute intervals if needed, but try and build up your endurance at least once a week.  Weight bearing exercise is also recommended twice weekly. For now, start at 5-10 minute intervals, trying to get in 30 minutes daily, and gradually getting up to 10-15 minute intervals if you are able to.

## 2013-03-14 ENCOUNTER — Other Ambulatory Visit: Payer: Self-pay | Admitting: Dermatology

## 2013-04-02 ENCOUNTER — Other Ambulatory Visit: Payer: Self-pay | Admitting: Dermatology

## 2013-04-02 ENCOUNTER — Other Ambulatory Visit: Payer: Self-pay

## 2013-04-02 DIAGNOSIS — Z1231 Encounter for screening mammogram for malignant neoplasm of breast: Secondary | ICD-10-CM

## 2013-04-16 ENCOUNTER — Other Ambulatory Visit: Payer: Self-pay | Admitting: Dermatology

## 2013-05-01 ENCOUNTER — Ambulatory Visit
Admission: RE | Admit: 2013-05-01 | Discharge: 2013-05-01 | Disposition: A | Discharge status: Home / Self Care | Payer: BC Managed Care – PPO | Source: Ambulatory Visit

## 2013-05-01 DIAGNOSIS — Z1231 Encounter for screening mammogram for malignant neoplasm of breast: Secondary | ICD-10-CM

## 2013-05-09 ENCOUNTER — Other Ambulatory Visit: Payer: Self-pay | Admitting: Family Medicine

## 2013-05-09 DIAGNOSIS — G43909 Migraine, unspecified, not intractable, without status migrainosus: Secondary | ICD-10-CM

## 2013-05-09 DIAGNOSIS — R51 Headache: Secondary | ICD-10-CM

## 2013-05-09 NOTE — Telephone Encounter (Signed)
done

## 2013-05-09 NOTE — Telephone Encounter (Signed)
Is this okay to refill these meds? 

## 2013-08-07 ENCOUNTER — Ambulatory Visit (INDEPENDENT_AMBULATORY_CARE_PROVIDER_SITE_OTHER): Payer: Medicare Other | Admitting: Family Medicine

## 2013-08-07 ENCOUNTER — Encounter: Payer: Self-pay | Admitting: Family Medicine

## 2013-08-07 VITALS — BP 112/74 | HR 84 | Ht 66.0 in | Wt 196.0 lb

## 2013-08-07 DIAGNOSIS — G5620 Lesion of ulnar nerve, unspecified upper limb: Secondary | ICD-10-CM

## 2013-08-07 DIAGNOSIS — R51 Headache: Secondary | ICD-10-CM

## 2013-08-07 DIAGNOSIS — G5601 Carpal tunnel syndrome, right upper limb: Secondary | ICD-10-CM

## 2013-08-07 DIAGNOSIS — G5600 Carpal tunnel syndrome, unspecified upper limb: Secondary | ICD-10-CM

## 2013-08-07 DIAGNOSIS — M25531 Pain in right wrist: Secondary | ICD-10-CM

## 2013-08-07 DIAGNOSIS — M25539 Pain in unspecified wrist: Secondary | ICD-10-CM

## 2013-08-07 DIAGNOSIS — G5621 Lesion of ulnar nerve, right upper limb: Secondary | ICD-10-CM

## 2013-08-07 MED ORDER — TIZANIDINE HCL 2 MG PO TABS: ORAL_TABLET | ORAL | Status: DC

## 2013-08-07 MED ORDER — NAPROXEN 500 MG PO TABS: 500.0000 mg | ORAL_TABLET | Freq: Two times a day (BID) | ORAL | Status: DC

## 2013-08-07 NOTE — Progress Notes (Signed)
Chief Complaint  Patient presents with  . Wrist Pain    right wrist pain x 2 weeks. Tried carpal tunnel splint, it helped some but is still in pain. Also right sided shoulder, pain in her wrist began shooting up her arm into her shoulder area x 4 days. Cant even pick up a gallon of milk. Needs refill on zanaflex.    2 weeks ago she started having pain in her right wrist.  Pain is on the ulnar side of the wrist, as well as central portion, extending about 3 inches into her forearm.  She woke up with the pain, at same level as current pain (not gradually worsening).  Denies any injury, change in activity or repetitive activity prior to onset of pain.  Currently has pain with any movement of the wrist.  Hurts to life anything, even to fasten her bra; cannot lift milk container.  She tried taking Motrin 400mg  three times daily for about 3 days.  She didn't notice much improvement.  She tried naproxen 440mg  twice daily for 3 days.  This helped some initially, but would wear off before the 12 hours.  Icing doesn't help; heat helps temporarily.  She has also tried her husband's voltaren gel, and this didn't help. She has worn carpal tunnel wrist brace, which helps with pain, by limiting flexion/extension and just serving as a reminder not to use her wrist much (it doesn't prevent supination/pronation, but she avoids these movements when wearing brace).  Today is the first day she left the house without the brace--having pain, wishes she had brought it with her. She has the brace from when she had wrist pain, thought it was CTS, but ultimately needed neck surgery.  She started having pain at right elbow about a week ago, and since then has also started radiating up to her shoulder.  Also has some pain in her right thumb.  She has some tingling on the ulnar side of hand, not extending into the fingers.  Denies weakness.  She had some swelling intially, which resolved. No h/o similar acute onset of pain/swelling in  other joints, no h/o gout.  Past Medical History  Diagnosis Date  . Anxiety panic attacks    [name]  . Hypertension   . Kidney stones h/o  . Chronic neck pain   . Chronic back pain   . Migraine    Past Surgical History  Procedure Laterality Date  . Neck surgery  [name]  Summer 2011  . Back surgery  Dr.Stern, last sx Summer 2011  . Tonsillectomy    . Rhinoplasty    . Neck surgery  03/2011   History   Social History  . Marital Status: Married    Spouse Name: N/A    Number of Children: N/A  . Years of Education: N/A   Occupational History  . Not on file.   Social History Main Topics  . Smoking status: Former Smoker    Quit date: 07/04/1987  . Smokeless tobacco: Never Used  . Alcohol Use: Yes     Comment: 0-1 drink per week  . Drug Use: No  . Sexual Activity: Yes    Partners: Male    Birth Control/ Protection: Other-see comments     Comment: husband had vasectomy   Other Topics Concern  . Not on file   Social History Narrative   Married.  Lives with husband, 1 daughter, father-in-law, brother-in-law (with Down's syndrome), 2 dogs.   2 other children live locally.  Disabled due to anxiety.  Previously worked as Public relations account executive.   Current Outpatient Prescriptions on File Prior to Visit  Medication Sig Dispense Refill  . ALPRAZolam (XANAX) 1 MG tablet Take 0.5 mg by mouth 3 (three) times daily as needed.      Marland Kitchen amLODipine-benazepril (LOTREL) 5-10 MG per capsule Take 1 capsule by mouth daily.  30 capsule  5  . ARIPiprazole (ABILIFY) 2 MG tablet Take 2 mg by mouth daily.        . DULoxetine (CYMBALTA) 60 MG capsule Take 60 mg by mouth 2 (two) times daily.      . fenofibrate 160 MG tablet Take 1 tablet (160 mg total) by mouth daily.  30 tablet  5  . gabapentin (NEURONTIN) 400 MG capsule Take 800 mg by mouth at bedtime.      Marland Kitchen JINTELI 1-5 MG-MCG TABS Take 1 tablet by mouth daily.      Marland Kitchen loratadine (CLARITIN) 10 MG tablet Take 10 mg by mouth daily.        Marland Kitchen  LORazepam (ATIVAN) 0.5 MG tablet Take 1-2 tablets by mouth 3 (three) times daily as needed. Prn anxiety      . Multiple Vitamins-Minerals (MULTIVITAMIN WITH MINERALS) tablet Take 1 tablet by mouth daily.        . Omega-3 Fatty Acids (FISH OIL) 1200 MG CPDR Take 1,200 mg by mouth daily.      Marland Kitchen eletriptan (RELPAX) 40 MG tablet One tablet by mouth at onset of headache. May repeat in 2 hours if headache persists or recurs. Max 80mg /24 hours. Take 1/2-1 tablet  5 tablet  0  . SUMAtriptan (IMITREX) 100 MG tablet TAKE 1 TAB BY MOUTH EVERY 2 HRS AS NEEDED FOR MIGRAINE (MAX OF 2 TABS IN 24 HRS)  10 tablet  0   No current facility-administered medications on file prior to visit.   Allergies  Allergen Reactions  . Penicillins Shortness Of Breath and Rash  . Codeine Other (See Comments)    Manic depressive  . Erythromycin Rash   ROS:  Denies fevers, chills, URI symptoms, cough, shortness of breath, chest pain, GI complaints, GU complaints, bleeding, bruising or other problems.  See HPI  PHYSICAL EXAM: BP 112/74  Pulse 84  Ht 5\' 6"  (1.676 m)  Wt 196 lb (88.905 kg)  BMI 31.65 kg/m2 Pleasant female, holding her wrist very still, in mod discomfort with movement and palpation RUE: Very tender at ulnar nerve at elbow--caused radiation of pain down the arm.  Also tender at medial epicondyle +tinel tap--causing pain into R thumb Tender at distal ulna, as well as lateral carpal bones.  No erythema, warmth or swelling. 2+ pulses; brisk capillary refill.  Normal sensation Normal strength.  ASSESSMENT/PLAN:  Right wrist pain - Plan: DG Wrist Complete Right, naproxen (NAPROSYN) 500 MG tablet  Right carpal tunnel syndrome  Ulnar neuropathy of right upper extremity  Headache - Plan: tiZANidine (ZANAFLEX) 2 MG tablet  Take naprosyn 500mg  BID for up to 2 weeks. Use wrist brace to immobilize joint and help with pain.  May use heat prn.  Avoid resting albow on hard surfaces.  Avoid repetitive wrist  movements. NSAID precautions, risks and side effects reviewed.  If pain ongoing, with bony tenderness remaining, go for x-ray in 1 week  If ongoing pain, may need PT/OT, possible ortho referral.  Differential diagnosis reviewed in detail.  Hx no consistent with gout.

## 2013-08-07 NOTE — Patient Instructions (Signed)
Continue to wear wrist brace. Start prescription anti-inflammatory, take twice daily for at least a week, up to the full 15 days if needed. Go get x-ray of wrist after a week if still having significant bony pain. Return for recheck in 2 weeks if not better.   Other treatment options include therapy (OT) vs referral to ortho.  Do not use diclofenac topical while taking the naproxen (shouldn't need both).  Avoid resting your elbows on hard surfaces.

## 2013-09-11 ENCOUNTER — Ambulatory Visit (INDEPENDENT_AMBULATORY_CARE_PROVIDER_SITE_OTHER): Payer: Medicare Other | Admitting: Family Medicine

## 2013-09-11 ENCOUNTER — Ambulatory Visit
Admission: RE | Admit: 2013-09-11 | Discharge: 2013-09-11 | Disposition: A | Discharge status: Home / Self Care | Payer: Medicare Other | Source: Ambulatory Visit | Attending: Family Medicine | Admitting: Family Medicine

## 2013-09-11 ENCOUNTER — Encounter: Payer: Self-pay | Admitting: Family Medicine

## 2013-09-11 VITALS — BP 130/80 | HR 72 | Ht 66.0 in | Wt 196.0 lb

## 2013-09-11 DIAGNOSIS — R5381 Other malaise: Secondary | ICD-10-CM

## 2013-09-11 DIAGNOSIS — E781 Pure hyperglyceridemia: Secondary | ICD-10-CM

## 2013-09-11 DIAGNOSIS — R51 Headache: Secondary | ICD-10-CM

## 2013-09-11 DIAGNOSIS — R5383 Other fatigue: Secondary | ICD-10-CM

## 2013-09-11 DIAGNOSIS — M79644 Pain in right finger(s): Secondary | ICD-10-CM

## 2013-09-11 DIAGNOSIS — G43909 Migraine, unspecified, not intractable, without status migrainosus: Secondary | ICD-10-CM

## 2013-09-11 DIAGNOSIS — M25531 Pain in right wrist: Secondary | ICD-10-CM

## 2013-09-11 DIAGNOSIS — M79609 Pain in unspecified limb: Secondary | ICD-10-CM

## 2013-09-11 DIAGNOSIS — I10 Essential (primary) hypertension: Secondary | ICD-10-CM

## 2013-09-11 DIAGNOSIS — G629 Polyneuropathy, unspecified: Secondary | ICD-10-CM

## 2013-09-11 DIAGNOSIS — M25539 Pain in unspecified wrist: Secondary | ICD-10-CM

## 2013-09-11 DIAGNOSIS — G589 Mononeuropathy, unspecified: Secondary | ICD-10-CM

## 2013-09-11 LAB — HEPATIC FUNCTION PANEL
ALT: 19 U/L (ref 0–35)
AST: 17 U/L (ref 0–37)
Albumin: 4.5 g/dL (ref 3.5–5.2)
Alkaline Phosphatase: 35 U/L — ABNORMAL LOW (ref 39–117)
Bilirubin, Direct: 0.2 mg/dL (ref 0.0–0.3)
Indirect Bilirubin: 0.3 mg/dL (ref 0.2–1.2)
Total Bilirubin: 0.5 mg/dL (ref 0.2–1.2)
Total Protein: 7.5 g/dL (ref 6.0–8.3)

## 2013-09-11 LAB — LIPID PANEL
Cholesterol: 198 mg/dL (ref 0–200)
HDL: 43 mg/dL (ref >39)
LDL Cholesterol: 116 mg/dL — ABNORMAL HIGH (ref 0–99)
Total CHOL/HDL Ratio: 4.6 Ratio
Triglycerides: 196 mg/dL — ABNORMAL HIGH (ref <150)
VLDL: 39 mg/dL (ref 0–40)

## 2013-09-11 LAB — TSH: TSH: 1.792 u[IU]/mL (ref 0.350–4.500)

## 2013-09-11 MED ORDER — TIZANIDINE HCL 2 MG PO TABS: ORAL_TABLET | ORAL | Status: DC

## 2013-09-11 MED ORDER — GABAPENTIN 800 MG PO TABS: 800.0000 mg | ORAL_TABLET | Freq: Every day | ORAL | Status: DC

## 2013-09-11 MED ORDER — AMLODIPINE BESY-BENAZEPRIL HCL 5-10 MG PO CAPS: 1.0000 | ORAL_CAPSULE | Freq: Every day | ORAL | Status: DC

## 2013-09-11 MED ORDER — NAPROXEN 500 MG PO TABS: 500.0000 mg | ORAL_TABLET | Freq: Two times a day (BID) | ORAL | Status: DC

## 2013-09-11 MED ORDER — FENOFIBRATE 160 MG PO TABS: 160.0000 mg | ORAL_TABLET | Freq: Every day | ORAL | Status: DC

## 2013-09-11 MED ORDER — SUMATRIPTAN SUCCINATE 100 MG PO TABS: ORAL_TABLET | ORAL | Status: DC

## 2013-09-11 NOTE — Progress Notes (Signed)
Chief Complaint  Patient presents with  . Hypertension    fasting med check. Still having ongoing issues with right thumb, wrist has improved from 08/07/13 visit.    Seen 2/5 with wrist pain. 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  Treated with naproxen and wrist brace.  Her wrist is much better, but she continues to have pain in her right thumb.  She is having trouble gripping things due to pain, it hurts to bend it.  Denies any trauma or injury. Denies any radiation of the pain up the arm/wrist. No numbness/tingling.  Hypertension follow-up:  Blood pressures elsewhere are 120/70-140/84, usually higher only if she has a headache.  Denies dizziness, chest pain.  Denies side effects of medications; no cough, edema.  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 Seems to be improved since Cymbalta dose was increased. More symptomatic in the evenings, but not as severe as it had been, just 4/10 at bedtime. She has continued on gabapentin 800 mg qHS as neuropathy is improved and tolerable on this regimen (we previously also discussed Lyrica).   Headaches/migraines: Getting headaches at least once a week, related to her neck. Migraine frequency seems to have increased in frequency to about once a week for the last couple of months.  She didn't get migraines for the 2 weeks that she was on the Naproxen. Previously, if caught early (prior to turning into migraine) will use naproxen, zanaflex and ice pack to neck, which would help, but this regimen doesn't seem to be working as well. She uses Imitrex or Relpax for treatment of migraines. The Relpax works well, but is very expensive.   Hyperlipidemia follow-up: Patient is reportedly following a low-fat, low cholesterol diet. Compliant with medications and denies medication side effects. She never increased the fish oil, still taking just one daily, and lipid panel had been okay on this regimen.  She isn't able to get much exercise due to fatigue, and responsibilities as caregiver for her family.  Significant stressors--father-in-law is in Hospice.  Brother-in-law with Down's syndrome is having signs of dementia, and requiring more care.  Either she or her husband needs to be at the house much of the time, and it is mostly her.  Past Medical History  Diagnosis Date  . Anxiety panic attacks    [name]  . Hypertension   . Kidney stones h/o  . Chronic neck pain   . Chronic back pain   . Migraine    Past Surgical History  Procedure Laterality Date  . Neck surgery  [name]  Summer 2011  . Back surgery  Dr.Stern, last sx Summer 2011  . Tonsillectomy    . Rhinoplasty    . Neck surgery  03/2011   History   Social History  . Marital Status: Married    Spouse Name: N/A    Number of Children: N/A  . Years of Education: N/A   Occupational History  . Not on file.   Social History Main Topics  . Smoking status: Former Smoker    Quit date: 07/04/1987  . Smokeless tobacco: Never Used  . Alcohol Use: Yes     Comment: 0-1 drink per week  . Drug Use: No  . Sexual Activity: Yes    Partners: Male    Birth Control/ Protection: Other-see comments     Comment: husband had vasectomy   Other Topics Concern  . Not on file   Social History  Narrative   Married.  Lives with husband, 1 daughter, father-in-law, brother-in-law (with Down's syndrome), 2 dogs.   2 other children live locally. Disabled due to anxiety.  Previously worked as Public relations account executive.   Outpatient Encounter Prescriptions as of 09/11/2013  Medication Sig Note  . ALPRAZolam (XANAX) 1 MG tablet Take 0.5 mg by mouth 3 (three) times daily as needed. 03/10/2013: Rarely uses.  Lorazepam added instead ([name])  . amLODipine-benazepril (LOTREL) 5-10 MG per capsule Take 1 capsule by mouth daily.   . ARIPiprazole (ABILIFY) 2 MG tablet Take 2 mg by mouth daily.     . DULoxetine (CYMBALTA) 60 MG capsule Take 60 mg by mouth 2  (two) times daily. 01/27/2013: Increased to this dosage last week by psychiatrist  . fenofibrate 160 MG tablet Take 1 tablet (160 mg total) by mouth daily.   Marland Kitchen gabapentin (NEURONTIN) 400 MG capsule Take 800 mg by mouth at bedtime.   Marland Kitchen JINTELI 1-5 MG-MCG TABS Take 1 tablet by mouth daily. 03/10/2013: Dr. Philis Pique  . LORazepam (ATIVAN) 0.5 MG tablet Take 1-2 tablets by mouth 3 (three) times daily as needed. Prn anxiety 03/10/2013: Using 1-2x/day, rx'd by [name]  . Multiple Vitamins-Minerals (MULTIVITAMIN WITH MINERALS) tablet Take 1 tablet by mouth daily.     . Omega-3 Fatty Acids (FISH OIL) 1200 MG CPDR Take 1,200 mg by mouth daily.   Marland Kitchen tiZANidine (ZANAFLEX) 2 MG tablet TAKE 1-2 TABLETS (2-4 MG TOTAL) BY MOUTH EVERY 6 (SIX) HOURS AS NEEDED (MUSCLE SPASM). 09/11/2013: Takes at least 1/day, for neck pain/headaches  . eletriptan (RELPAX) 40 MG tablet One tablet by mouth at onset of headache. May repeat in 2 hours if headache persists or recurs. Max 80mg /24 hours. Take 1/2-1 tablet 09/11/2013: Uses prn migraine (or imitrex, whichever she has handy)  . loratadine (CLARITIN) 10 MG tablet Take 10 mg by mouth daily.   09/11/2013: Takes prn allergies  . naproxen (NAPROSYN) 500 MG tablet Take 1 tablet (500 mg total) by mouth 2 (two) times daily with a meal. 09/11/2013: Completed course  . SUMAtriptan (IMITREX) 100 MG tablet TAKE 1 TAB BY MOUTH EVERY 2 HRS AS NEEDED FOR MIGRAINE (MAX OF 2 TABS IN 24 HRS) 09/11/2013: Prn migraine--about 1x/week   Allergies  Allergen Reactions  . Penicillins Shortness Of Breath and Rash  . Codeine Other (See Comments)    Manic depressive  . Erythromycin Rash   ROS: Bowels are "normal for her", once every 3 days.  Denies URI or allergy symptoms, although knows allergies will start soon, has started with slight postnasal drainage.  Denies cough, shortness of breath, wheezing.  Rare cough at night.  Denies heartburn.  Denies bleeding, bruising, rashes.  Denies chest pain, dizziness.   Numbness in toes of both feet (chronic since neck surgeries); denies weakness.  +neck and right thumb pain.  Denies insomnia.  PHYSICAL EXAM: BP 130/80  Pulse 72  Ht 5\' 6"  (1.676 m)  Wt 196 lb (88.905 kg)  BMI 31.65 kg/m2 Well developed, pleasant female in no distress HEENT:  PERRL, EOMI, conjunctiva clear Neck: WHSS posteriorly. No spine tenderness.  Tender along left sided paraspinous muscles and trapezius.  No lymphadenopathy, thyromegaly or mass Heart: regular rate and rhythm without murmur Lungs: clear bilaterally Abdomen: soft, nontender, no organomegaly or mass Extremities: no edema, 2+ pulse Right thumb--limited range of motion (slightly) of 1st MCP.  Full ROM of IP joint.  She is tender at the joint (medially, laterally and posteriorly).  No swelling  or effusion is noted.  Normal strength.  Negative Finkelstein test Psych: normal mood, affect, hygiene and grooming, normal speech, eye contact Skin: no rashes, lesions  ASSESSMENT/PLAN:  Essential hypertension, benign - well controlled - Plan: amLODipine-benazepril (LOTREL) 5-10 MG per capsule  Migraine headache - increased frequency due to increased stressors with underlying muscular component.  use naproxen more frequently early on for neck pain. - Plan: SUMAtriptan (IMITREX) 100 MG tablet  Pure hyperglyceridemia - Plan: fenofibrate 160 MG tablet, Hepatic function panel, Lipid panel  Headache - continue with naproxen and zanaflex early on.  stress reduction techniques.   - Plan: tiZANidine (ZANAFLEX) 2 MG tablet  Right wrist pain - resolved with NSAIDs - Plan: naproxen (NAPROSYN) 500 MG tablet  Neuropathy - stable - Plan: gabapentin (NEURONTIN) 800 MG tablet  Other malaise and fatigue - Plan: TSH  Pain of right thumb - Plan: DG Finger Thumb Right  Discussed importance of caring for herself in order to care for others, and to use Hospice wisely, as much as she can to help her. Consider counseling.  Headaches--daily  related to neck, with increased frequency of migraines. For the short-term, use naproxen every night (up to BID if needed, short-term) to help minimize frequency of migraines.  Remember to take the naproxen along with the zanaflex early on in headache, to try and prevent migraine.  Lipid, lft, TSH toay  6 mos--med check (fasting)  X-ray thumb Refer to hand doctor after x-ray reviewed  Addendum--x-ray normal; refer to Dr. Rhona Raider group

## 2013-09-11 NOTE — Patient Instructions (Addendum)
  Consider counseling.  For the short-term, use naproxen every night (up to BID if needed, short-term) to help minimize frequency of migraines.  Remember to take the naproxen along with the xanaflex early on in headache, to try and prevent migraine.  Go to [facility] for the x-ray of your thumb.  Once reviewed, we will be referring you to the hand doctor

## 2013-10-01 ENCOUNTER — Ambulatory Visit (INDEPENDENT_AMBULATORY_CARE_PROVIDER_SITE_OTHER): Payer: Medicare Other | Admitting: Family Medicine

## 2013-10-01 ENCOUNTER — Encounter: Payer: Self-pay | Admitting: Family Medicine

## 2013-10-01 VITALS — BP 130/80 | HR 88 | Temp 100.0°F | Ht 66.0 in | Wt 192.0 lb

## 2013-10-01 DIAGNOSIS — J019 Acute sinusitis, unspecified: Secondary | ICD-10-CM

## 2013-10-01 MED ORDER — AZITHROMYCIN 250 MG PO TABS: ORAL_TABLET | ORAL | Status: DC

## 2013-10-01 NOTE — Progress Notes (Signed)
Chief Complaint  Patient presents with  . Cough    and chest congestion, sinus drainage and congestion, tried netti pot but was too congested for it to work.  Feels flushed but when she checks temp it is sub-normal. B/L ear pain. HA and eyes hurt-hurts to open her eyes. (all meds reconciled) Symptoms started Friday and mucus is a pale yellow.    She started 5 days ago with chest congestion and hoarseness.  Then the head congestion developed 3-4 days ago. Chest congestion is a little better since using the Mucinex DM.  Head pain and symptoms as per chief complaint have worsened.  Tactile fevers started yesterday, along with chills. She is coughing, sometimes keeping her up at night. Isn't worsening, maybe a touch better.  Phlegm is pale yellow, very thick.  Mucus from her nose is also pale yellow, but thinner.  She states it hurts to take a deep breath, and sometimes feels like she isn't moving enough air.  She has been using Mucinex DM twice daily, Tylenol Cold liquid every 4hrs, Tylenol Allergy and sinus (one or the other, not both tylenol products together). She had also tried Mucinex FasMax once, but it didn't help.   She has been drinking a lot of fluids.  No known sick contacts.  Past Medical History  Diagnosis Date  . Anxiety panic attacks    [name]  . Hypertension   . Kidney stones h/o  . Chronic neck pain   . Chronic back pain   . Migraine    Past Surgical History  Procedure Laterality Date  . Neck surgery  [name]  Summer 2011  . Back surgery  Dr.Stern, last sx Summer 2011  . Tonsillectomy    . Rhinoplasty    . Neck surgery  03/2011   History   Social History  . Marital Status: Married    Spouse Name: N/A    Number of Children: N/A  . Years of Education: N/A   Occupational History  . Not on file.   Social History Main Topics  . Smoking status: Former Smoker    Quit date: 07/04/1987  . Smokeless tobacco: Never Used  . Alcohol Use: Yes     Comment: 0-1 drink per  week  . Drug Use: No  . Sexual Activity: Yes    Partners: Male    Birth Control/ Protection: Other-see comments     Comment: husband had vasectomy   Other Topics Concern  . Not on file   Social History Narrative   Married.  Lives with husband, 1 daughter, father-in-law, brother-in-law (with Down's syndrome), 2 dogs.   2 other children live locally. Disabled due to anxiety.  Previously worked as Public relations account executive.    Outpatient Encounter Prescriptions as of 10/01/2013  Medication Sig Note  . amLODipine-benazepril (LOTREL) 5-10 MG per capsule Take 1 capsule by mouth daily.   . ARIPiprazole (ABILIFY) 2 MG tablet Take 2 mg by mouth daily.     . Chlorphen-Pseudoephed-APAP (TYLENOL ALLERGY SINUS PO) Take 2 capsules by mouth as needed.   Marland Kitchen dextromethorphan-guaiFENesin (MUCINEX DM) 30-600 MG per 12 hr tablet Take 1 tablet by mouth 2 (two) times daily.   . DULoxetine (CYMBALTA) 60 MG capsule Take 60 mg by mouth 2 (two) times daily. 01/27/2013: Increased to this dosage last week by psychiatrist  . fenofibrate 160 MG tablet Take 1 tablet (160 mg total) by mouth daily.   Marland Kitchen gabapentin (NEURONTIN) 800 MG tablet Take 1 tablet (800 mg total)  by mouth at bedtime.   Marland Kitchen JINTELI 1-5 MG-MCG TABS Take 1 tablet by mouth daily. 03/10/2013: Dr. Philis Pique  . loratadine (CLARITIN) 10 MG tablet Take 10 mg by mouth daily.   09/11/2013: Takes prn allergies  . LORazepam (ATIVAN) 0.5 MG tablet Take 1-2 tablets by mouth 3 (three) times daily as needed. Prn anxiety 03/10/2013: Using 1-2x/day, rx'd by [name]  . Multiple Vitamins-Minerals (MULTIVITAMIN WITH MINERALS) tablet Take 1 tablet by mouth daily.     . Omega-3 Fatty Acids (FISH OIL) 1200 MG CPDR Take 2,400 mg by mouth daily.    Marland Kitchen tiZANidine (ZANAFLEX) 2 MG tablet TAKE 1-2 TABLETS (2-4 MG TOTAL) BY MOUTH EVERY 6 (SIX) HOURS AS NEEDED (MUSCLE SPASM).   Marland Kitchen ALPRAZolam (XANAX) 1 MG tablet Take 0.5 mg by mouth 3 (three) times daily as needed. 03/10/2013: Rarely uses.  Lorazepam  added instead ([name])  . DM-Phenylephrine-Acetaminophen (TYLENOL COLD MULTI-SYMPTOM DAY PO) Take 2 capsules by mouth as needed.   . eletriptan (RELPAX) 40 MG tablet One tablet by mouth at onset of headache. May repeat in 2 hours if headache persists or recurs. Max 80mg /24 hours. Take 1/2-1 tablet 09/11/2013: Uses prn migraine (or imitrex, whichever she has handy)  . naproxen (NAPROSYN) 500 MG tablet Take 1 tablet (500 mg total) by mouth 2 (two) times daily with a meal. Take as needed for pain   . Phenylephrine-APAP-Guaifenesin (MUCINEX FAST-MAX COLD & SINUS PO) Take 20 mLs by mouth every 4 (four) hours as needed.   . SUMAtriptan (IMITREX) 100 MG tablet TAKE 1 TAB BY MOUTH EVERY 2 HRS AS NEEDED FOR MIGRAINE (MAX OF 2 TABS IN 24 HRS)    Allergies  Allergen Reactions  . Penicillins Shortness Of Breath and Rash  . Codeine Other (See Comments)    Manic depressive  . Erythromycin Rash   Pt has tolerated z-pak in the past without rash or problems.  ROS:  Denies vomiting.  She has some nausea related to PND.  No diarrhea, bleeding, bruising, rashes. +headache, +back pain from coughing. No chest pain, urinary complaints  PHYSICAL EXAM: BP 130/80  Pulse 88  Temp(Src) 100 F (37.8 C) (Oral)  Ht 5\' 6"  (1.676 m)  Wt 192 lb (87.091 kg)  BMI 31.00 kg/m2  Congested, mildly ill appearing female (mostly fatigued appearing) in no distress. Not coughing. HEENT:  PERRL, EOMI, conjunctiva clear.  TM's and EACs normal. Nasal mucosa is mild-mod edematous, no purulence or erythema, some white mucus is noted posteriorly on left.  OP is clear Neck: no lymphadenopathy, thyromegaly Heart: regular rate and rhythm without murmur Lungs: clear bilaterally, no wheezes, rales, ronchi Skin: no rash Neuro: alert and oriented.  Cranial nerves intact. Normal gait, strength.  ASSESSMENT/PLAN:  Acute sinusitis - Plan: azithromycin (ZITHROMAX) 250 MG tablet  Continue to drink plenty of fluids. Continue Mucinex  DM. Continue to take a decongestant as needed for head congestion and sinus pain (my preference is a 12 hr sudafed, with just a single ingredient). Use tylenol and/or ibuprofen as needed for fever and pain.  Start the zpak today.  Call Monday if not any better to have prescription changed ,otherwise expect to feel partly better by then.  Zpak stays in your system for 10 days.  Call on day 10 if not completely better, as you might need a refill to prolong the course if you are still sick.

## 2013-10-01 NOTE — Patient Instructions (Signed)
   Continue to drink plenty of fluids. Continue Mucinex DM. Continue to take a decongestant as needed for head congestion and sinus pain (my preference is a 12 hr sudafed, with just a single ingredient). Use tylenol and/or ibuprofen as needed for fever and pain. Do sinus rinses once you are able to.  Start the zpak today.  Call Monday if not any better to have prescription changed ,otherwise expect to feel partly better by then.  Zpak stays in your system for 10 days.  Call on day 10 if not completely better, as you might need a refill to prolong the course if you are still sick.

## 2013-10-06 ENCOUNTER — Telehealth: Payer: Self-pay | Admitting: Family Medicine

## 2013-10-06 NOTE — Telephone Encounter (Signed)
As directed at her visit last week, "Call Monday if not any better to have prescription changed ,otherwise expect to feel partly better by then. Zpak stays in your system for 10 days. Call on day 10 if not completely better, as you might need a refill to prolong the course if you are still sick."   Since she sounds like she is somewhat better, have her call back later (around day 10) for refill if she isn't completely better then.

## 2013-10-06 NOTE — Telephone Encounter (Signed)
Pt called and stated that she is some better but still not 100%. [facility] uses [facility] in [place]. She also states she finished medication yesterday.

## 2013-10-07 NOTE — Telephone Encounter (Signed)
Patient states she is still extremely congested sinus wise. She would like to what else could she use OTC besides sudafed because it is not helping. CLS

## 2013-10-07 NOTE — Telephone Encounter (Signed)
LMOM TO CB. CLS 

## 2013-10-07 NOTE — Telephone Encounter (Signed)
She should try doing sinus rinses (sinus rinse kit or Neti-pot) twice daily.  She can consider using a nasal steroid spray (which is usually for allergies, not for colds or sinus infections), but this takes a few days to become completely effective.  It is now over the counter (Flonase and Nasacort); won't be needed longterm if she doesn't really have allergies.  Afrin is the only other alternative, but that can cause rebound congestion, and be worse once it wears off than prior to using, if she uses it regularly.  It is okay to use short-term, if needed (ie just to use it at bedtime so she can fall asleep).  It shouldn't be used for more than 3 days. Please advise pt.  Thanks

## 2013-10-07 NOTE — Telephone Encounter (Signed)
She is supposed to get a refill on z-pak to start on Saturday, only if not 100% better on Friday.  This seems to be the second or third time she is asking about the prescription (that isn't supposed to be started until Saturday).  As long as she is completely clear that she isn't supposed to start the second z-pak until Saturday, you can go ahead and send a refill in now if that is what she prefers.  Otherwise, I was expecting either a phone call or MyChart Message on Friday stating that she wasn't competely better, and would send in the refill to her pharmacy.  Whatever she prefers, as long as she knows she isn't supposed to start it until Saturday.

## 2013-10-07 NOTE — Telephone Encounter (Signed)
She wants to know if you are going to send her in another round of antibiotic. CLS   Patient is aware of Dr. Tomi Bamberger recommendations in detail. CLS

## 2013-10-08 NOTE — Telephone Encounter (Signed)
Patient is aware of Dr. Johnsie Kindred message. CLS

## 2014-02-19 ENCOUNTER — Ambulatory Visit (INDEPENDENT_AMBULATORY_CARE_PROVIDER_SITE_OTHER): Payer: Medicare Other | Admitting: Family Medicine

## 2014-02-19 ENCOUNTER — Encounter: Payer: Self-pay | Admitting: Family Medicine

## 2014-02-19 ENCOUNTER — Ambulatory Visit
Admission: RE | Admit: 2014-02-19 | Discharge: 2014-02-19 | Disposition: A | Discharge status: Home / Self Care | Payer: Medicare Other | Source: Ambulatory Visit | Attending: Family Medicine | Admitting: Family Medicine

## 2014-02-19 VITALS — BP 124/86 | HR 88 | Ht 66.0 in | Wt 195.0 lb

## 2014-02-19 DIAGNOSIS — R51 Headache: Secondary | ICD-10-CM

## 2014-02-19 DIAGNOSIS — R5381 Other malaise: Secondary | ICD-10-CM

## 2014-02-19 DIAGNOSIS — M5412 Radiculopathy, cervical region: Secondary | ICD-10-CM

## 2014-02-19 DIAGNOSIS — R5383 Other fatigue: Secondary | ICD-10-CM

## 2014-02-19 LAB — CBC WITH DIFFERENTIAL/PLATELET
Basophils Absolute: 0.1 10*3/uL (ref 0.0–0.1)
Basophils Relative: 1 % (ref 0–1)
Eosinophils Absolute: 0.1 10*3/uL (ref 0.0–0.7)
Eosinophils Relative: 1 % (ref 0–5)
HCT: 39.5 % (ref 36.0–46.0)
Hemoglobin: 13.6 g/dL (ref 12.0–15.0)
Lymphocytes Relative: 45 % (ref 12–46)
Lymphs Abs: 2.9 10*3/uL (ref 0.7–4.0)
MCH: 32.9 pg (ref 26.0–34.0)
MCHC: 34.4 g/dL (ref 30.0–36.0)
MCV: 95.4 fL (ref 78.0–100.0)
Monocytes Absolute: 0.5 10*3/uL (ref 0.1–1.0)
Monocytes Relative: 8 % (ref 3–12)
Neutro Abs: 2.9 10*3/uL (ref 1.7–7.7)
Neutrophils Relative %: 45 % (ref 43–77)
Platelets: 399 10*3/uL (ref 150–400)
RBC: 4.14 MIL/uL (ref 3.87–5.11)
RDW: 13.3 % (ref 11.5–15.5)
WBC: 6.4 10*3/uL (ref 4.0–10.5)

## 2014-02-19 LAB — COMPREHENSIVE METABOLIC PANEL
ALT: 21 U/L (ref 0–35)
AST: 18 U/L (ref 0–37)
Albumin: 5 g/dL (ref 3.5–5.2)
Alkaline Phosphatase: 37 U/L — ABNORMAL LOW (ref 39–117)
BUN: 12 mg/dL (ref 6–23)
CO2: 24 mEq/L (ref 19–32)
Calcium: 9.5 mg/dL (ref 8.4–10.5)
Chloride: 100 mEq/L (ref 96–112)
Creat: 0.76 mg/dL (ref 0.50–1.10)
Glucose, Bld: 62 mg/dL — ABNORMAL LOW (ref 70–99)
Potassium: 4.6 mEq/L (ref 3.5–5.3)
Sodium: 134 mEq/L — ABNORMAL LOW (ref 135–145)
Total Bilirubin: 0.4 mg/dL (ref 0.2–1.2)
Total Protein: 8.1 g/dL (ref 6.0–8.3)

## 2014-02-19 MED ORDER — METHYLPREDNISOLONE (PAK) 4 MG PO TABS: ORAL_TABLET | ORAL | Status: DC

## 2014-02-19 NOTE — Patient Instructions (Signed)
Go to [facility] for neck x-ray. Let us know if you do not receive a phone call to set up your sleep study  Follow up with your neurosurgeon if neck problem isn't resolving.  If

## 2014-02-19 NOTE — Progress Notes (Signed)
Chief Complaint  Patient presents with  . Neck Pain    has been seen in the past for neck pain. Has had 4 neck surgeries. Over the last month her neck pain has worsened and it is radiating down her right arm and neck. She has extreme fatigue, cannot seem to stay awake, sleep all the time. She can get up and do a simple task and need to go lay down and take a nap. (will come back for flu shot i the next month or so)   She has chronic neck pain, but over the last month it has been worse. It has been radiating down her right arm, and into her hand (mostly the thumb).  She has noticed some weakness in her right arm.  She is using the zanaflex about every other day, usually at bedtime.  It temporarily eases the pain (in neck and arm), although last night it didn't.  She is also using naproxen, usually just once daily, over the last month.  It bothers her stomach, even with just once daily, so doesn't take it twice.  It helps some.  She has had 4 surgeries in her neck (and 4 in her lumbar spine).  Last surgery was 03/2011 (C4-5 posterior fusion).  She hasn't seen Dr. Vertell Limber since being released after the last surgery.  Denies any recent trauma or injury.  Fatigue--she reports this is more than her chronic issues with energy.  She is sleeping a lot more over the last 3 months. She seems to be sleeping pretty well at night (with her medications for the neck pain) She is on sedating medications, but they haven't changed and her fatigue is worse. She doesn't feel like her depression is worse, feels like she is coping okay. She has f/u appt with [name] in October. Husband states she snores.  She feels fairly refreshed when she wakes up.  Husband doesn't note too much apnea, but recalls that nurses have mentioned in the past that she had pauses/apnea (when hospitalized in the past).  Past Medical History  Diagnosis Date  . Anxiety panic attacks    [name]  . Hypertension   . Kidney stones h/o  . Chronic  neck pain   . Chronic back pain   . Migraine    Past Surgical History  Procedure Laterality Date  . Neck surgery  [name]  Summer 2011  . Back surgery  Dr.Stern, last sx Summer 2011  . Tonsillectomy    . Rhinoplasty    . Neck surgery  03/2011   History   Social History  . Marital Status: Married    Spouse Name: N/A    Number of Children: N/A  . Years of Education: N/A   Occupational History  . Not on file.   Social History Main Topics  . Smoking status: Former Smoker    Quit date: 07/04/1987  . Smokeless tobacco: Never Used  . Alcohol Use: Yes     Comment: 0-1 drink per week  . Drug Use: No  . Sexual Activity: Yes    Partners: Male    Birth Control/ Protection: Other-see comments     Comment: husband had vasectomy   Other Topics Concern  . Not on file   Social History Narrative   Married.  Lives with husband, 1 daughter, father-in-law, brother-in-law (with Down's syndrome), 2 dogs.   2 other children live locally. Disabled due to anxiety.  Previously worked as Public relations account executive.   Current Outpatient Prescriptions  on File Prior to Visit  Medication Sig Dispense Refill  . amLODipine-benazepril (LOTREL) 5-10 MG per capsule Take 1 capsule by mouth daily.  30 capsule  5  . DULoxetine (CYMBALTA) 60 MG capsule Take 60 mg by mouth 2 (two) times daily.      . fenofibrate 160 MG tablet Take 1 tablet (160 mg total) by mouth daily.  30 tablet  5  . gabapentin (NEURONTIN) 800 MG tablet Take 1 tablet (800 mg total) by mouth at bedtime.  30 tablet  5  . JINTELI 1-5 MG-MCG TABS Take 1 tablet by mouth daily.      Marland Kitchen loratadine (CLARITIN) 10 MG tablet Take 10 mg by mouth daily.        Marland Kitchen LORazepam (ATIVAN) 0.5 MG tablet Take 1-2 tablets by mouth 3 (three) times daily as needed. Prn anxiety      . Multiple Vitamins-Minerals (MULTIVITAMIN WITH MINERALS) tablet Take 1 tablet by mouth daily.        . naproxen (NAPROSYN) 500 MG tablet Take 1 tablet (500 mg total) by mouth 2 (two) times  daily with a meal. Take as needed for pain  60 tablet  1  . tiZANidine (ZANAFLEX) 2 MG tablet TAKE 1-2 TABLETS (2-4 MG TOTAL) BY MOUTH EVERY 6 (SIX) HOURS AS NEEDED (MUSCLE SPASM).  30 tablet  0  . eletriptan (RELPAX) 40 MG tablet One tablet by mouth at onset of headache. May repeat in 2 hours if headache persists or recurs. Max 42m/24 hours. Take 1/2-1 tablet  5 tablet  0  . Phenylephrine-APAP-Guaifenesin (MUCINEX FAST-MAX COLD & SINUS PO) Take 20 mLs by mouth every 4 (four) hours as needed.      . SUMAtriptan (IMITREX) 100 MG tablet TAKE 1 TAB BY MOUTH EVERY 2 HRS AS NEEDED FOR MIGRAINE (MAX OF 2 TABS IN 24 HRS)  10 tablet  1   No current facility-administered medications on file prior to visit.   Allergies  Allergen Reactions  . Penicillins Shortness Of Breath and Rash  . Codeine Other (See Comments)    Manic depressive  . Erythromycin Rash   ROS:  No increase in migraine headache frequency. +posterior headaches.  No fevers, chills, URI symptoms, chest pain, shortness of breath, GI complaints.  +radiculopathy and pain as per HPI. Depression stable.  No bleeding, bruising, rash, GI or GU complaints.  See HPI.  PHYSICAL EXAM: BP 124/86  Pulse 88  Ht _0  (1.676 m)  Wt 195 lb (88.451 kg)  BMI 31.49 kg/m2 Well developed, pleasant female, accompanied by her husband. She doesn't appear to be in any distress.  Alert Neck: diffusely tender along c-spine, and R>L paraspinous muscles and trapezius tenderness. No lymphadenopathy or thyromegaly Heart: regular rate and rhythm Lungs: clear bilaterally Neuro: alert and oriented.  DTR's are diminished bilaterally.  No significant weakness noted.  Normal sensation Psych: normal mood, affect, hygiene and grooming, speech and eye contact  Lab Results  Component Value Date   TSH 1.792 09/11/2013    ASSESSMENT/PLAN:  Other fatigue - multifactorial.  r/o sleep apnea - Plan: Comprehensive metabolic panel, CBC with Differential, Vit D  25 hydroxy  (rtn osteoporosis monitoring), Split night study  Cervical radiculopathy - mult sx in past.  XR to check on hardware, acute changes. Steroid course; f/u with neurosurgeon if not improving - Plan: methylPREDNIsolone (MEDROL DOSPACK) 4 MG tablet, DG Cervical Spine Complete  Headache - muscular, posteriorly.  continue zanaflex.  no NSAIDs while on steroids   c-spine  series CBC, c-met, Vitamin D (last was 2013, 31) Refer for sleep study (split night)  Risks/side effects of steroids were reviewed in detail.  Declines flu shot --"too early"

## 2014-02-20 LAB — VITAMIN D 25 HYDROXY (VIT D DEFICIENCY, FRACTURES): Vit D, 25-Hydroxy: 43 ng/mL (ref 30–89)

## 2014-04-01 ENCOUNTER — Other Ambulatory Visit: Payer: Self-pay

## 2014-04-01 DIAGNOSIS — Z1231 Encounter for screening mammogram for malignant neoplasm of breast: Secondary | ICD-10-CM

## 2014-04-06 ENCOUNTER — Other Ambulatory Visit: Payer: Self-pay | Admitting: Family Medicine

## 2014-04-06 NOTE — Telephone Encounter (Signed)
Not sure if this os okay to fill. Was 8/20 OV considered a med check or is she overdue?

## 2014-04-06 NOTE — Telephone Encounter (Signed)
Ok to refill x 3 months.  Per March visit, she was due for a fasting med check.  It was more of an acute visit that she was here for.  We did not address lipids or blood pressure (although we did do some labs).  Needs med check (or CPE--last was 06/2012; she sees GYN though)

## 2014-04-15 ENCOUNTER — Other Ambulatory Visit: Payer: Self-pay | Admitting: Family Medicine

## 2014-04-15 ENCOUNTER — Other Ambulatory Visit: Payer: Self-pay | Admitting: *Deleted

## 2014-04-15 MED ORDER — FENOFIBRATE 160 MG PO TABS: ORAL_TABLET | ORAL | Status: DC

## 2014-04-21 ENCOUNTER — Ambulatory Visit (HOSPITAL_BASED_OUTPATIENT_CLINIC_OR_DEPARTMENT_OTHER): Payer: Medicare Other | Attending: Family Medicine | Admitting: Radiology

## 2014-04-21 VITALS — Ht 67.0 in | Wt 190.0 lb

## 2014-04-21 DIAGNOSIS — R5383 Other fatigue: Secondary | ICD-10-CM | POA: Diagnosis not present

## 2014-04-21 DIAGNOSIS — G4733 Obstructive sleep apnea (adult) (pediatric): Secondary | ICD-10-CM | POA: Diagnosis present

## 2014-05-02 DIAGNOSIS — R5383 Other fatigue: Secondary | ICD-10-CM

## 2014-05-02 DIAGNOSIS — G4733 Obstructive sleep apnea (adult) (pediatric): Secondary | ICD-10-CM

## 2014-05-02 NOTE — Sleep Study (Signed)
   NAME: DELEAH TISON DATE OF BIRTH:  [birth year] MEDICAL RECORD NUMBER [id number]  LOCATION: [facility]  PHYSICIAN: Korban Shearer D  DATE OF STUDY: 04/21/2014  SLEEP STUDY TYPE: Nocturnal Polysomnogram               REFERRING PHYSICIAN: Rita Ohara, MD  INDICATION FOR STUDY: Hypersomnia with sleep apnea  EPWORTH SLEEPINESS SCORE:   11/24 HEIGHT: 5\' 7"  (170.2 cm)  WEIGHT: 190 lb (86.183 kg)    Body mass index is 29.75 kg/(m^2).  NECK SIZE: 14 in.  MEDICATIONS: Charted for review  SLEEP ARCHITECTURE: Total sleep time 290.5 minutes with sleep efficiency 80.5%. Stage I was 24.1%, stage II 68.2%, stage III absent, REM 7.7% of total sleep time. Sleep latency 28 minutes, REM latency 282 minutes, awake after sleep onset 42.5 minutes, arousal index 38, bedtime medication: Tylenol allergy and sinus  RESPIRATORY DATA: Apnea hypopnea index (AHI) 5.2 per hour. 25 total events scored including 5 obstructive apneas and 20 hypopneas. All events for bile supine. REM AHI 50.7 per hour. There were not enough early events to permit application of split CPAP protocol.  OXYGEN DATA: Occasional moderate snoring with oxygen desaturation to a nadir of 80% and mean saturation 94.6% on room air.  CARDIAC DATA: Sinus rhythm with PVCs  MOVEMENT/PARASOMNIA: No significant movement disturbance, no bathroom trips  IMPRESSION/ RECOMMENDATION:   1) Minimal obstructive sleep apnea/hypopneas syndrome, AHI 5.2 per hour, REM AHI 50.7 per hour. Occasional moderate snoring with oxygen desaturation to a nadir of 80% and mean saturation 94.6% on room air 2) Patients with scores in this range would usually be addressed conservatively, encouraging weight loss and sleep off flat of back where appropriate.  Deneise Lever Diplomate, American Board of Sleep Medicine  ELECTRONICALLY SIGNED ON:  05/02/2014, 11:48 AM [place] PH: [phone]   FX: [phone] [place]

## 2014-05-04 ENCOUNTER — Ambulatory Visit
Admission: RE | Admit: 2014-05-04 | Discharge: 2014-05-04 | Disposition: A | Discharge status: Home / Self Care | Payer: Medicare Other | Source: Ambulatory Visit

## 2014-05-04 DIAGNOSIS — Z1231 Encounter for screening mammogram for malignant neoplasm of breast: Secondary | ICD-10-CM

## 2014-05-11 ENCOUNTER — Telehealth: Payer: Self-pay | Admitting: *Deleted

## 2014-05-11 NOTE — Telephone Encounter (Signed)
Patient did get her flu shot through Hospice(father in law in under hospice care). Documented in chart.

## 2014-05-11 NOTE — Telephone Encounter (Signed)
Called patient to go over sleep study results: IMPRESSION/ RECOMMENDATION:     1) Minimal obstructive sleep apnea/hypopneas syndrome, AHI 5.2 per hour, REM AHI 50.7 per hour. Occasional moderate snoring with oxygen desaturation to a nadir of 80% and mean saturation 94.6% on room air   2) Patients with scores in this range would usually be addressed conservatively, encouraging weight loss and sleep off flat of back where appropriate.         Also, remind her to get flu shot if she hasn't already (she had said it was "too early" when here in August, and declined).

## 2014-05-21 ENCOUNTER — Encounter: Payer: Self-pay | Admitting: Family Medicine

## 2014-06-01 ENCOUNTER — Ambulatory Visit (INDEPENDENT_AMBULATORY_CARE_PROVIDER_SITE_OTHER): Payer: Medicare Other | Admitting: Family Medicine

## 2014-06-01 ENCOUNTER — Encounter: Payer: Self-pay | Admitting: Family Medicine

## 2014-06-01 VITALS — BP 110/66 | HR 68 | Ht 66.0 in | Wt 189.0 lb

## 2014-06-01 DIAGNOSIS — R51 Headache: Secondary | ICD-10-CM

## 2014-06-01 DIAGNOSIS — I10 Essential (primary) hypertension: Secondary | ICD-10-CM

## 2014-06-01 DIAGNOSIS — G8929 Other chronic pain: Secondary | ICD-10-CM

## 2014-06-01 DIAGNOSIS — E781 Pure hyperglyceridemia: Secondary | ICD-10-CM

## 2014-06-01 DIAGNOSIS — M542 Cervicalgia: Secondary | ICD-10-CM

## 2014-06-01 DIAGNOSIS — G629 Polyneuropathy, unspecified: Secondary | ICD-10-CM

## 2014-06-01 DIAGNOSIS — M79671 Pain in right foot: Secondary | ICD-10-CM

## 2014-06-01 MED ORDER — PREGABALIN 75 MG PO CAPS: 75.0000 mg | ORAL_CAPSULE | Freq: Two times a day (BID) | ORAL | Status: DC

## 2014-06-01 NOTE — Progress Notes (Signed)
Chief Complaint  Patient presents with  . Hypertension    fasting med check.    6-8 weeks ago she hurt her right heel.  She thought it was a "stone bruise", might have stepped on a rock, and can no longer put weight on her heel. Not worse after sitting or in the morning, pain is the same whenever pressure is put on it.  She doesn't recall any injury--was just guessing that maybe she stepped on a rock. She has been wearing supportive, cushioned shoes, with thick/cushioned socks, without improvement.  Son was involved in car accident 2 days ago--other driver was killed (it was not son's fault).  He broke bones in his foot.  He cannot drive, and so is temporarily living with his parents.  Her father-in-law also lives with them, on Hospice, and her brother-in-law, and she helps care for all of them.  She was last seen in August with neck pain.  She continues to have neck pain.  She has been doing stretches, using Tizanidine (eases the pain) once or twice daily.  She was treated with a Medrol Dosepak, which helped for a while, but has had some recurrent pain down right arm, not every day (some days more than others), not as bad as at last visit.  Hypertension follow-up: Blood pressures elsewhere are 120's/80. Denies dizziness, chest pain. Denies side effects of medications; no cough, edema.  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 Neuropathy has gotten worse in the past 3 months.  She previously took Cymbalta, but that was changed by her psychiatrist (to Prozac, about 3 months ago).  Moods are much better since the med change; previously her pain had responded to Cymbalta (but not the moods). She has continued on gabapentin 800 mg qHS.  She wasn't able to tolerate 300mg  dose during day (of gabapentin).  She doesn't recall ever taking Lyrica.  Burning pain, cold/tingling, shooting pains in her feet, worse when she walks.  Doesn't have neuropathy  symptoms in her upper extremities:  Headaches/migraines: Getting headaches about once a week, related to her neck. She uses Naproxen, tizanidine and imitrex and goes to bed when she has a headache; migraine resolves, but has persistent neck pain and headache the next morning.  She uses Imitrex (or Relpax) for treatment of migraines, and is often effective; uses it once a week.  Takes naproxen about 3x/week.  She has tried steroid injections for her neck in the past, as well as PT (last was 2 years ago, not helpful). She continues to do home exercises. She saw a headache specialist many years ago (maybe 39?).  Hyperlipidemia follow-up: Patient is reportedly following a low-fat, low cholesterol diet. Compliant with medications and denies medication side effects. She is still taking just one daily. She isn't able to get much exercise due to fatigue, and responsibilities as caregiver for her family.   She walks about twice a week. Significant stressors--father-in-law is in Hospice. Brother-in-law with Down's syndrome also lives with them; he is having signs of dementia, and requiring more care. Either she or her husband needs to be at the house much of the time, and it is mostly her. As stated above, her son is also not living with them, since he is unable to drive since he fractured bones in his foot.  She had sleep study done as part of evaluation of fatigue.   IMPRESSION/ RECOMMENDATION:  1) Minimal obstructive sleep apnea/hypopneas syndrome, AHI 5.2 per hour,  REM AHI 50.7 per hour. Occasional moderate snoring with oxygen desaturation to a nadir of 80% and mean saturation 94.6% on room air 2) Patients with scores in this range would usually be addressed conservatively, encouraging weight loss and sleep off flat of back where appropriate.  PMH,PSH, SH, reviewed/updated.  Outpatient Encounter Prescriptions as of 06/01/2014  Medication Sig Note  . amLODipine-benazepril (LOTREL) 5-10 MG per  capsule Take 1 capsule by mouth daily.   . Ascorbic Acid (VITAMIN C) 1000 MG tablet Take 1,000 mg by mouth daily.   . fenofibrate 160 MG tablet TAKE 1 TABLET (160 MG TOTAL) BY MOUTH DAILY.   Marland Kitchen FLUoxetine (PROZAC) 40 MG capsule Take 40 mg by mouth daily.  06/01/2014: Switched by psych from cymbalta about 3 months ago  . FOLIC ACID PO Take 1 tablet by mouth daily.   Marland Kitchen gabapentin (NEURONTIN) 800 MG tablet TAKE 1 TABLET (800 MG TOTAL) BY MOUTH AT BEDTIME.   Marland Kitchen JINTELI 1-5 MG-MCG TABS Take 1 tablet by mouth daily. 03/10/2013: Dr. Philis Pique  . loratadine (CLARITIN) 10 MG tablet Take 10 mg by mouth daily.   09/11/2013: Takes prn allergies  . LORazepam (ATIVAN) 0.5 MG tablet Take 1-2 tablets by mouth 3 (three) times daily as needed. Prn anxiety 03/10/2013: Using 1-2x/day, rx'd by [name]  . Multiple Vitamins-Minerals (MULTIVITAMIN WITH MINERALS) tablet Take 1 tablet by mouth daily.     . Omega-3 Fatty Acids (FISH OIL PO) Take 1,400 mg by mouth daily.   Marland Kitchen eletriptan (RELPAX) 40 MG tablet One tablet by mouth at onset of headache. May repeat in 2 hours if headache persists or recurs. Max 80mg /24 hours. Take 1/2-1 tablet (Patient not taking: Reported on 06/01/2014) 09/11/2013: Uses prn migraine (or imitrex, whichever she has handy)  . naproxen (NAPROSYN) 500 MG tablet Take 1 tablet (500 mg total) by mouth 2 (two) times daily with a meal. Take as needed for pain (Patient not taking: Reported on 06/01/2014) 06/01/2014: Takes about 3x/week, as needed for neck pain/headache  . SUMAtriptan (IMITREX) 100 MG tablet TAKE 1 TAB BY MOUTH EVERY 2 HRS AS NEEDED FOR MIGRAINE (MAX OF 2 TABS IN 24 HRS) (Patient not taking: Reported on 06/01/2014) 02/19/2014: Uses this or Relpax, depending on availability  . tiZANidine (ZANAFLEX) 2 MG tablet TAKE 1-2 TABLETS (2-4 MG TOTAL) BY MOUTH EVERY 6 (SIX) HOURS AS NEEDED (MUSCLE SPASM). (Patient not taking: Reported on 06/01/2014) 06/01/2014: Uses it once or twice daily, needing it most days  .  [DISCONTINUED] DULoxetine (CYMBALTA) 60 MG capsule Take 60 mg by mouth 2 (two) times daily. 01/27/2013: Increased to this dosage last week by psychiatrist  . [DISCONTINUED] methylPREDNIsolone (MEDROL DOSPACK) 4 MG tablet follow package directions   . [DISCONTINUED] Phenylephrine-APAP-Guaifenesin ([place] FAST-MAX COLD & SINUS PO) Take 20 mLs by mouth every 4 (four) hours as needed.    Allergies  Allergen Reactions  . Penicillins Shortness Of Breath and Rash  . Codeine Other (See Comments)    Manic depressive  . Erythromycin Rash   ROS:  Denies fevers, chills, URI symptoms. No chest pain, palpitations, shortness of breath.  +heel and neck pain, and neuropathy pain, as per HPI.  Moods are improved. Carpal tunnel hasn't been bothering her, uses her brace just prn. No bleeding, bruising, rashes, GI or GU complaints.  See HPI.  PHYSICAL EXAM: BP 110/66 mmHg  Pulse 68  Ht 5\' 6"  (1.676 m)  Wt 189 lb (85.73 kg)  BMI 30.52 kg/m2 Well developed, pleasant female in no  distress HEENT: PERRL, EOMI, conjunctiva clear.   Neck: Diffusely "sore" in her neck muscles--no spasm or focal tenderness.  Spine is nontender. WHSS. No lymphadenopathy or thyromegaly, no carotid bruit Heart: regular rate and rhythm without murmur Lungs: clear bilaterally Abdomen: soft, nontender, no organomegaly or mass Extremities: normal pulses, no edema.Tender at central portion of right calcaneus, only slightly so at PF insertion/origin; no pain with stretch of PF Neuro: alert and oriented.  Cranial nerves intact.  Grossly normal strength, sensation; normal gait. Psych; normal mood, affect, hygiene and grooming   ASSESSMENT/PLAN:  Essential hypertension, benign - well controlled - Plan: Comprehensive metabolic panel  Neuropathy - increasing symptoms since Cymbalta was changed to Prozac.  Continue gabapentin, and add Lyrica - Plan: pregabalin (LYRICA) 75 MG capsule, DISCONTINUED: pregabalin (LYRICA) 75 MG capsule  Pure  hyperglyceridemia - Plan: Comprehensive metabolic panel, Lipid panel  Heel pain, right - exam/hx isn't consistent with PF.  Check x-ray. continue supportive shoes, good arch supports - Plan: DG Foot 2 Views Right  Neck pain  Chronic nonintractable headache, unspecified headache type - daily headaches; muscular/tension component, and some migraines  Pain management referral--this is NOT simply for pain meds, but for a more comprehensive regimen of pain control.  Hoping to have some sort of injection (ie Botox) to help with neck pain and headaches, as well as treatment for neuropathy.  If pain management cannot do this, will check with neurologists.  If needed, will consider sending to Dr. Domingo Cocking for headaches/botox, but since also has neuropathy, and neck pain, will try eval with pain management first (I don't believe he will treat her neuropathy, just headaches)  Trial of lyrica, starting at 75mg  BID  F/u 6 months for CPE, sooner prn (for ongoing issues with neuropathy, headaches, pain), if unable to get adequately controlled through specialist/referral

## 2014-06-01 NOTE — Patient Instructions (Signed)
Go to [facility] for x-ray of your right heel. Start Lyrica twice daily at 75mg . We are going to refer you to pain management to help with your neck pain, headaches and neuropathy.  Continue your other current medications.

## 2014-06-02 ENCOUNTER — Encounter: Payer: Self-pay | Admitting: Family Medicine

## 2014-06-02 LAB — LIPID PANEL
Cholesterol: 190 mg/dL (ref 0–200)
HDL: 43 mg/dL (ref >39)
LDL Cholesterol: 113 mg/dL — ABNORMAL HIGH (ref 0–99)
Total CHOL/HDL Ratio: 4.4 Ratio
Triglycerides: 168 mg/dL — ABNORMAL HIGH (ref <150)
VLDL: 34 mg/dL (ref 0–40)

## 2014-06-02 LAB — COMPREHENSIVE METABOLIC PANEL
ALT: 21 U/L (ref 0–35)
AST: 19 U/L (ref 0–37)
Albumin: 4.7 g/dL (ref 3.5–5.2)
Alkaline Phosphatase: 38 U/L — ABNORMAL LOW (ref 39–117)
BUN: 13 mg/dL (ref 6–23)
CO2: 25 mEq/L (ref 19–32)
Calcium: 9.7 mg/dL (ref 8.4–10.5)
Chloride: 104 mEq/L (ref 96–112)
Creat: 0.82 mg/dL (ref 0.50–1.10)
Glucose, Bld: 81 mg/dL (ref 70–99)
Potassium: 4.7 mEq/L (ref 3.5–5.3)
Sodium: 136 mEq/L (ref 135–145)
Total Bilirubin: 0.4 mg/dL (ref 0.2–1.2)
Total Protein: 7.6 g/dL (ref 6.0–8.3)

## 2014-06-12 ENCOUNTER — Ambulatory Visit
Admission: RE | Admit: 2014-06-12 | Discharge: 2014-06-12 | Disposition: A | Discharge status: Home / Self Care | Payer: Medicare Other | Source: Ambulatory Visit | Attending: Family Medicine | Admitting: Family Medicine

## 2014-06-12 DIAGNOSIS — M79671 Pain in right foot: Secondary | ICD-10-CM

## 2014-06-25 ENCOUNTER — Other Ambulatory Visit: Payer: Self-pay | Admitting: Family Medicine

## 2014-06-29 ENCOUNTER — Encounter: Payer: Self-pay | Admitting: Family Medicine

## 2014-06-29 ENCOUNTER — Ambulatory Visit (INDEPENDENT_AMBULATORY_CARE_PROVIDER_SITE_OTHER): Payer: Medicare Other | Admitting: Family Medicine

## 2014-06-29 VITALS — BP 132/80 | HR 68 | Ht 67.5 in | Wt 194.0 lb

## 2014-06-29 DIAGNOSIS — M79671 Pain in right foot: Secondary | ICD-10-CM

## 2014-06-29 NOTE — Progress Notes (Signed)
Chief Complaint  Patient presents with  . heel pain    heel pain has gotten worse than better. been going on since october, throbbing stabbing pain   She was seen for evaluation of right heel pain along with her routine med check last month.  She had injured it 6-8 weeks prior, described as a "stone bruise".  We sent her for x-rays, which didn't show any acute findings, just some 1st MTP degenerative changes.  She has been taking ibuprofen 800mg  2-3 times daily, which isn't helping.  She has also tried ice and heat, neither of which helped.  Ice made it worse. Pain is now going into the arch, and also along the back of the heel up to her ankle. She has been walking on the ball of her foot--too painful to bear weight on the heel. She has been using an arch support/heel support that has made it tolerable to wear shoes.   X-ray of right foot 06/12/14: FINDINGS: There are minimal degenerative changes of first metatarsal phalangeal joint. The other bones appear normal. No plantar calcaneal osteophyte.  IMPRESSION: Minimal degenerative changes of the first metatarsal phalangeal joint.   Lyrica was added to her regimen last month (since neuropathy symptoms got worse after psych changed from cymbalta to prozac).  "it is working"--tingling in the feet has improved.  Denies side effects.  PMH, PSH, SH reviewed.  Outpatient Encounter Prescriptions as of 06/29/2014  Medication Sig Note  . amLODipine-benazepril (LOTREL) 5-10 MG per capsule TAKE 1 CAPSULE BY MOUTH DAILY.   Marland Kitchen Ascorbic Acid (VITAMIN C) 1000 MG tablet Take 1,000 mg by mouth daily.   Marland Kitchen eletriptan (RELPAX) 40 MG tablet One tablet by mouth at onset of headache. May repeat in 2 hours if headache persists or recurs. Max 80mg /24 hours. Take 1/2-1 tablet 09/11/2013: Uses prn migraine (or imitrex, whichever she has handy)  . fenofibrate 160 MG tablet TAKE 1 TABLET (160 MG TOTAL) BY MOUTH DAILY.   Marland Kitchen FLUoxetine (PROZAC) 40 MG capsule Take 40  mg by mouth daily.  06/01/2014: Switched by psych from cymbalta about 3 months ago  . FOLIC ACID PO Take 1 tablet by mouth daily.   Marland Kitchen gabapentin (NEURONTIN) 800 MG tablet TAKE 1 TABLET (800 MG TOTAL) BY MOUTH AT BEDTIME.   Marland Kitchen JINTELI 1-5 MG-MCG TABS Take 1 tablet by mouth daily. 03/10/2013: Dr. Philis Pique  . loratadine (CLARITIN) 10 MG tablet Take 10 mg by mouth daily.   09/11/2013: Takes prn allergies  . LORazepam (ATIVAN) 0.5 MG tablet Take 1-2 tablets by mouth 3 (three) times daily as needed. Prn anxiety 03/10/2013: Using 1-2x/day, rx'd by [name]  . Multiple Vitamins-Minerals (MULTIVITAMIN WITH MINERALS) tablet Take 1 tablet by mouth daily.     . naproxen (NAPROSYN) 500 MG tablet Take 1 tablet (500 mg total) by mouth 2 (two) times daily with a meal. Take as needed for pain 06/01/2014: Takes about 3x/week, as needed for neck pain/headache  . Omega-3 Fatty Acids (FISH OIL PO) Take 1,400 mg by mouth daily.   . pregabalin (LYRICA) 75 MG capsule Take 1 capsule (75 mg total) by mouth 2 (two) times daily.   . SUMAtriptan (IMITREX) 100 MG tablet TAKE 1 TAB BY MOUTH EVERY 2 HRS AS NEEDED FOR MIGRAINE (MAX OF 2 TABS IN 24 HRS) 02/19/2014: Uses this or Relpax, depending on availability  . tiZANidine (ZANAFLEX) 2 MG tablet TAKE 1-2 TABLETS (2-4 MG TOTAL) BY MOUTH EVERY 6 (SIX) HOURS AS NEEDED (MUSCLE SPASM). 06/01/2014: Uses  it once or twice daily, needing it most days   Allergies  Allergen Reactions  . Penicillins Shortness Of Breath and Rash  . Codeine Other (See Comments)    Manic depressive  . Erythromycin Rash   ROS: no fevers, chills, rash, GI complaints.  Tingling in feet is less.  +foot pain.  No edema. No URI symptoms or other concerns.  PHYSICAL EXAM: BP 132/80 mmHg  Pulse 68  Ht 5' 7.5" (1.715 m)  Wt 194 lb (87.998 kg)  BMI 29.92 kg/m2 Pleasant female, appears comfortable at rest. She is exquisitely sensitive to even very light palpation over her heel and entire arch of the right foot.  There  is no visible swelling, redness, no rash or bruising.  She has mild fissures at heel, but without drainage, erythema, warmth or swelling. She is also mildly tender over the achilles tendon--no erythema, swelling or redness.  Has some discomfort with dorsiflexion of foot.  2+ pulses, no edema.   Right foot pain--now also having some posterior pain (achilles), likely related to change in gait due to heel pain. Suspect plantar fasciitis given normal x-rays.  She is so tender on exam, that I prefer to refer to [place] rather than try and do injection here.  She made need additional therapy.  Continue arch support.  Briefly discussed oral steroids if unable to get in before next week  --appointment was made with Dr. Paulla Dolly for Thursday 12/31

## 2014-07-01 ENCOUNTER — Other Ambulatory Visit: Payer: Self-pay | Admitting: *Deleted

## 2014-07-01 DIAGNOSIS — G629 Polyneuropathy, unspecified: Secondary | ICD-10-CM

## 2014-07-01 DIAGNOSIS — R519 Headache, unspecified: Secondary | ICD-10-CM

## 2014-07-01 DIAGNOSIS — R51 Headache: Principal | ICD-10-CM

## 2014-07-02 ENCOUNTER — Ambulatory Visit (INDEPENDENT_AMBULATORY_CARE_PROVIDER_SITE_OTHER): Payer: Medicare Other | Admitting: Podiatry

## 2014-07-02 ENCOUNTER — Encounter: Payer: Self-pay | Admitting: Podiatry

## 2014-07-02 ENCOUNTER — Ambulatory Visit (INDEPENDENT_AMBULATORY_CARE_PROVIDER_SITE_OTHER): Payer: Medicare Other

## 2014-07-02 VITALS — BP 106/68 | HR 71 | Resp 16 | Ht 67.0 in | Wt 190.0 lb

## 2014-07-02 DIAGNOSIS — M722 Plantar fascial fibromatosis: Secondary | ICD-10-CM

## 2014-07-02 MED ORDER — TRIAMCINOLONE ACETONIDE 10 MG/ML IJ SUSP
10.0000 mg | Freq: Once | INTRAMUSCULAR | Status: AC
  Administered 2014-07-02: 10 mg

## 2014-07-02 MED ORDER — DICLOFENAC SODIUM 75 MG PO TBEC: 75.0000 mg | DELAYED_RELEASE_TABLET | Freq: Two times a day (BID) | ORAL | Status: DC

## 2014-07-02 NOTE — Patient Instructions (Signed)

## 2014-07-02 NOTE — Progress Notes (Signed)
   Subjective:    Patient ID: Lindsey Rodriguez, female    DOB: September 18, 1965, 48 y.o.   MRN: [id number]  HPI Comments: i have right heel pain that has spread into my arch, back of my heel and ankle. Ive had it for 10 weeks. Its getting worse. It hurts to walk and stand. i can not put any weight on it. i have taken ibuprofen, naprosyn, ice, elevate, otc inserts, and that's it. i seen dr Tomi Bamberger and she told me to take ibuprofen.   Foot Pain Associated symptoms include fatigue and headaches.      Review of Systems  Constitutional: Positive for fatigue.  HENT: Positive for sinus pressure.   Musculoskeletal:       Difficulty walking   Allergic/Immunologic: Positive for environmental allergies.  Neurological: Positive for headaches.  Psychiatric/Behavioral: The patient is nervous/anxious.   All other systems reviewed and are negative.      Objective:   Physical Exam        Assessment & Plan:

## 2014-07-02 NOTE — Progress Notes (Signed)
Subjective:     Patient ID: Lindsey Rodriguez, female   DOB: February 28, 1966, 48 y.o.   MRN: [id number]  HPI patient presents with very painful right heel that's been present for several months and is now causing pain in her forefoot and is causing pain in her arch   Review of Systems  All other systems reviewed and are negative.      Objective:   Physical Exam  Constitutional: She is oriented to person, place, and time.  Cardiovascular: Intact distal pulses.   Musculoskeletal: Normal range of motion.  Neurological: She is oriented to person, place, and time.  Skin: Skin is warm.  Nursing note and vitals reviewed.  neurovascular status intact with muscle strength adequate and range of motion subtalar and midtarsal joint within normal limits. Patient's noted to have good digital perfusion is well oriented 3 and has no equinus condition noted. Patient has exquisite discomfort plantar fascial right at the insertion of the tendon into the calcaneus with fluid buildup around the medial band     Assessment:     Plantar fasciitis right with inflammation and fluid around the medial band noted    Plan:     H&P and x-ray reviewed and injected the right plantar fascia 3 mg Kenalog 5 mg Xylocaine and applied fascial brace with instructions. Gave instructions on physical therapy and reappoint in 1 week

## 2014-07-06 ENCOUNTER — Telehealth: Payer: Self-pay | Admitting: *Deleted

## 2014-07-06 NOTE — Telephone Encounter (Signed)
Pt states her right foot hurts worse, does she need to come in earlier.  Pt states she has an appt 07/09/2014.

## 2014-07-07 ENCOUNTER — Other Ambulatory Visit: Payer: Self-pay | Admitting: Family Medicine

## 2014-07-07 NOTE — Telephone Encounter (Signed)
Schedule for tomorrow

## 2014-07-07 NOTE — Telephone Encounter (Signed)
Called in.

## 2014-07-08 ENCOUNTER — Other Ambulatory Visit: Payer: Self-pay | Admitting: Family Medicine

## 2014-07-09 ENCOUNTER — Ambulatory Visit (INDEPENDENT_AMBULATORY_CARE_PROVIDER_SITE_OTHER): Payer: Medicare Other | Admitting: Podiatry

## 2014-07-09 VITALS — BP 135/87 | HR 66 | Resp 17

## 2014-07-09 DIAGNOSIS — M722 Plantar fascial fibromatosis: Secondary | ICD-10-CM

## 2014-07-09 MED ORDER — TRIAMCINOLONE ACETONIDE 10 MG/ML IJ SUSP
10.0000 mg | Freq: Once | INTRAMUSCULAR | Status: AC
  Administered 2014-07-09: 10 mg

## 2014-07-10 NOTE — Progress Notes (Signed)
Subjective:     Patient ID: Lindsey Rodriguez, female   DOB: 06/05/1966, 49 y.o.   MRN: [id number]  HPI patient states the pain has been quite severe in the plantar right heel and she did not get significant relief from the injection. States it's worse with weightbearing or any time she has to put pressure against her heel   Review of Systems     Objective:   Physical Exam Neurovascular status intact with no other muscle strength issues and pain to palpation plantar aspect right heel at the insertion of the tendon into the calcaneus    Assessment:     Plantar fasciitis right with inflammation and fluid around the medial band    Plan:     Reviewed condition and recommended complete immobilization due to pain and applied air fracture walker with instructions on usage and also went ahead today and injected the plantar fascia 3 mg Kenalog 5 mg Xylocaine Marcaine mixture to reduce inflammation

## 2014-07-12 ENCOUNTER — Other Ambulatory Visit: Payer: Self-pay | Admitting: Family Medicine

## 2014-07-13 ENCOUNTER — Other Ambulatory Visit: Payer: Self-pay | Admitting: Family Medicine

## 2014-07-13 ENCOUNTER — Telehealth: Payer: Self-pay | Admitting: *Deleted

## 2014-07-13 NOTE — Telephone Encounter (Signed)
"  This message is for Lindsey Rodriguez.  I got the boot Thursday afternoon.  I wore it Thursday and Friday.  By Friday night, my back was in spasms so bad I had to take medication for it.  I did not wear the boot Saturday or Sunday.  Is there anything else I can do?"

## 2014-07-13 NOTE — Telephone Encounter (Signed)
I left a message for the patient to call me back on tomorrow.  Dr. Paulla Dolly said to tell her to wear the boot when sitting.

## 2014-07-14 NOTE — Telephone Encounter (Signed)
I returned her call.  I informed her that Dr. Paulla Dolly said to wear the boot while sitting.  "Wear the boot while sitting, okay.  I also have another question I need to ask.  I have an Abscess on my tooth.  I was prescribed Ibuprofen.  They mentioned to me not taking the Diclofenac with it.  Is that the case?"  Yes, you do not want to take those together because they are both considerate as NSAIDs and can cause irritation to your stomach.  "Well which is better to use?"  I told her probably the Ibuprofen because it can reduce the pain better.  If you need something additional for pain take Tylenol.  "Okay, thank you so much."

## 2014-08-06 ENCOUNTER — Ambulatory Visit (INDEPENDENT_AMBULATORY_CARE_PROVIDER_SITE_OTHER): Payer: Medicare Other | Admitting: Podiatry

## 2014-08-06 ENCOUNTER — Encounter: Payer: Self-pay | Admitting: Podiatry

## 2014-08-06 VITALS — BP 120/69 | HR 79 | Resp 16

## 2014-08-06 DIAGNOSIS — M722 Plantar fascial fibromatosis: Secondary | ICD-10-CM

## 2014-08-07 NOTE — Progress Notes (Signed)
Subjective:     Patient ID: Lindsey Rodriguez, female   DOB: [birth year], 49 y.o.   MRN: [id number]  HPI patient states my right heel is a little better but it is still very sore and it only felt better when I wore the boot but it is not better when I don't.   Review of Systems     Objective:   Physical Exam Neurovascular status intact with muscle strength adequate and noted to have intense discomfort when I pressed the plantar aspect of the right heel at the insertional point the tendon the calcaneus medial side    Assessment:     Continued severe plantar fasciitis right with inflammation and fluid buildup    Plan:     Reviewed condition and conservative options we have utilized up to this point and due to the intensity of discomfort and failure to respond him mobilization I've recommended long-term shockwave therapy. Patient cannot wear boots I did dispense a night splint for at least stretching during the nonambulatory stage and aggressive ice therapy. I reviewed shockwave and my hope this will get her better and not require open surgery and she is scheduled for procedures

## 2014-08-11 ENCOUNTER — Ambulatory Visit (INDEPENDENT_AMBULATORY_CARE_PROVIDER_SITE_OTHER): Payer: Medicare Other | Admitting: Neurology

## 2014-08-11 ENCOUNTER — Encounter: Payer: Self-pay | Admitting: Neurology

## 2014-08-11 VITALS — BP 118/82 | HR 76 | Ht 67.0 in | Wt 196.0 lb

## 2014-08-11 DIAGNOSIS — M542 Cervicalgia: Secondary | ICD-10-CM

## 2014-08-11 DIAGNOSIS — G4440 Drug-induced headache, not elsewhere classified, not intractable: Secondary | ICD-10-CM

## 2014-08-11 DIAGNOSIS — G4441 Drug-induced headache, not elsewhere classified, intractable: Secondary | ICD-10-CM

## 2014-08-11 DIAGNOSIS — G43719 Chronic migraine without aura, intractable, without status migrainosus: Secondary | ICD-10-CM

## 2014-08-11 MED ORDER — ZONISAMIDE 25 MG PO CAPS: ORAL_CAPSULE | ORAL | Status: DC

## 2014-08-11 MED ORDER — ZOLMITRIPTAN 5 MG NA SOLN: 1.0000 | NASAL | Status: DC | PRN

## 2014-08-11 NOTE — Progress Notes (Signed)
NEUROLOGY CONSULTATION NOTE  CHANYA CHRISLEY MRN: [id number] DOB: Nov 05, 1965  Referring provider: Dr. Tomi Bamberger Primary care provider: Dr. 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  Records  reviewed.  Onset:  Childhood, but worse over the past year Location:  Varies (back of head, crown, bi-temporal, retro-orbital, band-like) Quality:  Retro-orbital throbbing, band-like vice, stabbing at back of head Intensity:  4/10 constant, 8/10 when severe Aura:  no Prodrome:  no Associated symptoms:  Nausea, photophobia, phonophobia, tunnel vision Duration:  Constant but severe episodes 8 hours (with sumatriptan and naproxen) Frequency:  Daily but severe episodes occur once a week Triggers/exacerbating factors:  stress Relieving factors:  Ice pack Activity:  Cannot function at least once a week.  Past abortive therapy:  Relpax 40mg , Treximet, Maxalt, Zomig po, Tramadol Past preventative therapy:  Cymbalta, Topamax, nortriptyline, sertraline, Effexor, biofeedback.  She is uncertain if she tried depakote.  Current abortive therapy:  sumatriptan 100mg  with naproxen, ibuprofen, Excedrin, tizanidine Current preventative therapy:  none Other current medications:  Lyrica 75mg  twice daily. Gabapentin 800mg , tizanidine  Caffeine:  no Alcohol:  rarely Smoker:  no Diet:  healthy Exercise:  Not lately due to plantar fasciitis Depression/stress:  Stress (on disability due to neuropathy from back surgery and panic attacks which are now better controlled).  Also stress related to home as she lives with her ailing father in law in hospice and brother in law with Down Syndrome. Sleep hygiene:  Poor.  Able to fall asleep but wakes up easily. Family history of headache:   mother  PAST MEDICAL HISTORY: Past Medical History  Diagnosis Date  . Anxiety panic attacks    [name]  . Hypertension   . Kidney stones h/o  . Chronic neck pain   . Chronic back pain   . Migraine     PAST SURGICAL HISTORY: Past Surgical History  Procedure Laterality Date  . Neck surgery  [name]  Summer 2011    X4  . Back surgery  Dr.Stern, last sx Summer 2011    X4  . Tonsillectomy    . Rhinoplasty      MEDICATIONS: Current Outpatient Prescriptions on File Prior to Visit  Medication Sig Dispense Refill  . amLODipine-benazepril (LOTREL) 5-10 MG per capsule TAKE 1 CAPSULE BY MOUTH DAILY. 30 capsule 2  . Ascorbic Acid (VITAMIN C) 1000 MG tablet Take 1,000 mg by mouth daily.    . B Complex Vitamins (B-COMPLEX/B-12 PO) Take by mouth daily.    . fenofibrate 160 MG tablet TAKE 1 TABLET (160 MG TOTAL) BY MOUTH DAILY. 90 tablet 0  . FOLIC ACID PO Take 1 tablet by mouth daily.    Marland Kitchen gabapentin (NEURONTIN) 800 MG tablet TAKE 1 TABLET (800 MG TOTAL) BY MOUTH AT BEDTIME. 30 tablet 5  . JINTELI 1-5 MG-MCG TABS Take 1 tablet by mouth daily.    Marland Kitchen loratadine (CLARITIN) 10 MG tablet Take 10 mg by mouth daily.      Marland Kitchen LORazepam (ATIVAN) 0.5 MG tablet Take 1-2 tablets by mouth 3 (three) times daily as needed. Prn anxiety    . Multiple Vitamins-Minerals (MULTIVITAMIN WITH MINERALS) tablet Take 1 tablet by mouth daily.      . naproxen (NAPROSYN) 500 MG tablet Take 1 tablet (500 mg total) by mouth 2 (two)  times daily with a meal. Take as needed for pain 60 tablet 1  . Omega-3 Fatty Acids (FISH OIL PO) Take 1,400 mg by mouth daily.    . SUMAtriptan (IMITREX) 100 MG tablet TAKE 1 TAB BY MOUTH EVERY 2 HRS AS NEEDED FOR MIGRAINE (MAX OF 2 TABS IN 24 HRS) 10 tablet 1  . tiZANidine (ZANAFLEX) 2 MG tablet TAKE 1-2 TABLETS (2-4 MG TOTAL) BY MOUTH EVERY 6 (SIX) HOURS AS NEEDED (MUSCLE SPASM). 30 tablet 0  . diclofenac (VOLTAREN) 75 MG EC tablet Take 1 tablet (75 mg total) by mouth 2 (two) times daily.  (Patient not taking: Reported on 08/11/2014) 50 tablet 2   No current facility-administered medications on file prior to visit.    ALLERGIES: Allergies  Allergen Reactions  . Penicillins Shortness Of Breath and Rash  . Codeine Other (See Comments)    Manic depressive  . Erythromycin Rash    FAMILY HISTORY: Family History  Problem Relation Age of Onset  . Hypertension Mother   . Depression Mother   . Migraines Mother   . COPD Father   . Heart failure Father   . Breast cancer Cousin     40's  . Cancer Cousin     breast; in her 79's  . Diabetes Neg Hx     SOCIAL HISTORY: History   Social History  . Marital Status: Married    Spouse Name: N/A    Number of Children: N/A  . Years of Education: N/A   Occupational History  . Not on file.   Social History Main Topics  . Smoking status: Former Smoker    Quit date: 07/04/1987  . Smokeless tobacco: Never Used  . Alcohol Use: 0.0 oz/week    0 Not specified per week     Comment: ONCE A MONTH  . Drug Use: No  . Sexual Activity:    Partners: Male    Birth Control/ Protection: Other-see comments     Comment: husband had vasectomy   Other Topics Concern  . Not on file   Social History Narrative   Married.  Lives with husband, 1 daughter, father-in-law, brother-in-law (with Down's syndrome), 2 dogs.   2 other children live locally. Disabled due to anxiety.  Previously worked as Public relations account executive.    REVIEW OF SYSTEMS: Constitutional: No fevers, chills, or sweats, no generalized fatigue, change in appetite Eyes: No visual changes, double vision, eye pain Ear, nose and throat: No hearing loss, ear pain, nasal congestion, sore throat Cardiovascular: No chest pain, palpitations Respiratory:  No shortness of breath at rest or with exertion, wheezes GastrointestinaI: No nausea, vomiting, diarrhea, abdominal pain, fecal incontinence Genitourinary:  No dysuria, urinary retention or frequency Musculoskeletal:  neck pain,  back pain Integumentary: No rash, pruritus, skin lesions Neurological: as above Psychiatric: stressed, insomnia, anxiety Endocrine: No palpitations, fatigue, diaphoresis, mood swings, change in appetite, change in weight, increased thirst Hematologic/Lymphatic:  No anemia, purpura, petechiae. Allergic/Immunologic: no itchy/runny eyes, nasal congestion, recent allergic reactions, rashes  PHYSICAL EXAM: Filed Vitals:   08/11/14 1235  BP: 118/82  Pulse: 76   General: No acute distress Head:  Normocephalic/atraumatic Eyes:  fundi unremarkable, without vessel changes, exudates, hemorrhages or papilledema. Neck: supple,bilateral paraspinal tenderness, full range of motion Back: bilateral paraspinal tenderness Heart: regular rate and rhythm Lungs: Clear to auscultation bilaterally. Vascular: No carotid bruits. Neurological Exam: Mental status: alert and oriented to person, place, and time, recent and remote memory intact, fund of knowledge intact, attention and concentration  intact, speech fluent and not dysarthric, language intact. Cranial nerves: CN I: not tested CN II: pupils equal, round and reactive to light, visual fields intact, fundi unremarkable, without vessel changes, exudates, hemorrhages or papilledema. CN III, IV, VI:  full range of motion, no nystagmus, no ptosis CN V: facial sensation intact CN VII: upper and lower face symmetric CN VIII: hearing intact CN IX, X: gag intact, uvula midline CN XI: sternocleidomastoid and trapezius muscles intact CN XII: tongue midline Bulk & Tone: normal, no fasciculations. Motor:  5/5 throughout Sensation:  Temperature and vibration intact Deep Tendon Reflexes:  2+ throughout, toes downgoing Finger to nose testing:  No dysmetria Heel to shin:  No dysmetria Gait:  Slight right limp due to plantar fasciitis.  Able to turn and walk in tandem. Romberg negative.  IMPRESSION: Chronic migraine without aura, intractable Medication overuse  headache Neck pain  PLAN: 1.  She would like to try zonisamide because it helps her husband.  We will titrate to 50mg  at bedtime. 2.  Stop sumatriptan and over the counter pain medications. 3.  For abortive therapy, will try Zomig 5mg  nasal spray.  Samples provided. 4.  Refer for cognitive behavioral therapy 5.  Will provide information on Botox.  She is apprehensive due to fear of needles. 6.  Headache diary 7.  Call in 4 weeks with update.  Follow up in 3 months.  Thank you for allowing me to take part in the care of this patient.  Metta Clines, DO  CC:  Victoriano Lain, MD

## 2014-08-11 NOTE — Patient Instructions (Signed)
1.  We will start zonisamide 25mg  at bedtime for 7 days, then increase to 2 capsules (50mg ) at bedtime.  Call in 4 weeks with update 2.  Stop sumatriptan and over the counter medications for pain.  You should not take pain relievers more than 2 days out of the week.  I would limit use of tizanidine if possible. 3.  At the earliest onset of a migraine headache, use the Zomig nasal spray.  Spray 1 spray in one nostril.  May repeat a spray once in 2 hours if needed.  Do not exceed two sprays in 24 hours. 4.  We will refer you for cognitive behavioral therapy 5.  Please review literature on Botox 6.  Keep headache diary.  Call in 4 weeks with update and we can adjust dose if needed. 7.  Follow up in 3 months.

## 2014-08-12 ENCOUNTER — Encounter: Payer: Self-pay | Admitting: Family Medicine

## 2014-08-20 ENCOUNTER — Ambulatory Visit (INDEPENDENT_AMBULATORY_CARE_PROVIDER_SITE_OTHER): Payer: Medicare Other | Admitting: Podiatry

## 2014-08-20 DIAGNOSIS — M722 Plantar fascial fibromatosis: Secondary | ICD-10-CM

## 2014-08-20 MED ORDER — TRAMADOL HCL 50 MG PO TABS: 50.0000 mg | ORAL_TABLET | Freq: Three times a day (TID) | ORAL | Status: DC | PRN

## 2014-08-20 NOTE — Progress Notes (Signed)
Patient ID: RYA RAUSCH, female   DOB: [birth year], 49 y.o.   MRN: [id number] NO CHANGES STILL HURTING, READY FOR MY FIRST EPAT TREATMENT

## 2014-08-20 NOTE — Progress Notes (Signed)
Subjective:     Patient ID: Lindsey Rodriguez, female   DOB: 11/04/1965, 49 y.o.   MRN: [id number]  HPI patient presents with severe pain in the right plantar heel   Review of Systems     Objective:   Physical Exam  Neurovascular status unchanged with severe discomfort medial band right plantar fascia    Assessment:     Severe plantar fasciitis right    Plan:     Shockwave administered to 0.8 intensity 2500 shocks 16 frequency. She had a lot of pain with this and started her on tramadol to try to help and advised on taking pain medicine before the next treatment

## 2014-08-27 ENCOUNTER — Ambulatory Visit: Payer: Medicare Other | Admitting: Podiatry

## 2014-08-28 ENCOUNTER — Ambulatory Visit: Payer: Medicare Other | Admitting: Podiatry

## 2014-08-28 ENCOUNTER — Ambulatory Visit (INDEPENDENT_AMBULATORY_CARE_PROVIDER_SITE_OTHER): Payer: Medicare Other | Admitting: Podiatry

## 2014-08-28 DIAGNOSIS — M722 Plantar fascial fibromatosis: Secondary | ICD-10-CM

## 2014-08-28 NOTE — Progress Notes (Signed)
Bar -- 3.4    Freq 16.0   Pulses 2500

## 2014-08-29 NOTE — Progress Notes (Signed)
Subjective:     Patient ID: Lindsey Rodriguez, female   DOB: 1965-10-04, 49 y.o.   MRN: [id number]  HPI patient presents with very tender plantar fascial symptoms right heel   Review of Systems     Objective:   Physical Exam Neurovascular status intact with diminishment of some of the intense discomfort but still quite present right heel    Assessment:     Continued significant plantar fasciitis right    Plan:     2500 pulses administered 16 frequency 3.4 on intensity

## 2014-09-04 ENCOUNTER — Ambulatory Visit (INDEPENDENT_AMBULATORY_CARE_PROVIDER_SITE_OTHER): Payer: Medicare Other | Admitting: Podiatry

## 2014-09-04 DIAGNOSIS — M722 Plantar fascial fibromatosis: Secondary | ICD-10-CM

## 2014-09-04 NOTE — Progress Notes (Signed)
Bar  4.0   freq -  16.0  Pulses  2500

## 2014-09-05 NOTE — Progress Notes (Signed)
Subjective:     Patient ID: Lindsey Rodriguez, female   DOB: August 17, 1965, 49 y.o.   MRN: [id number]  HPI patient presents for shockwave #3 of the right foot   Review of Systems     Objective:   Physical Exam Neurovascular status intact with mild diminishment of discomfort right heel    Assessment:     Improving from plantar fasciitis with shockwave right    Plan:     2500 shocks 4.0 on intensity 16 frequency administered which was tolerated better by patient. Continue with boot usage occasionally and reappoint in 1 month for fourth treatment

## 2014-09-08 ENCOUNTER — Other Ambulatory Visit: Payer: Self-pay | Admitting: Neurology

## 2014-09-08 ENCOUNTER — Other Ambulatory Visit: Payer: Self-pay | Admitting: Family Medicine

## 2014-09-08 NOTE — Telephone Encounter (Signed)
Ok to refill x 3 months.  Has appt in June

## 2014-09-08 NOTE — Telephone Encounter (Signed)
Is this okay to call in? 

## 2014-09-09 NOTE — Telephone Encounter (Signed)
Called in med to pharmacy  

## 2014-09-13 ENCOUNTER — Other Ambulatory Visit: Payer: Self-pay | Admitting: Family Medicine

## 2014-09-14 MED ORDER — ZONISAMIDE 25 MG PO CAPS: ORAL_CAPSULE | ORAL | Status: DC

## 2014-09-15 ENCOUNTER — Other Ambulatory Visit: Payer: Self-pay | Admitting: Family Medicine

## 2014-09-15 NOTE — Telephone Encounter (Signed)
Is this okay?

## 2014-09-15 NOTE — Telephone Encounter (Signed)
Has appt in June.  Okay to refill x 3 mos

## 2014-09-16 ENCOUNTER — Other Ambulatory Visit: Payer: Self-pay | Admitting: *Deleted

## 2014-09-16 NOTE — Telephone Encounter (Signed)
Called in Lyrica as this can not be escribed.

## 2014-09-18 ENCOUNTER — Other Ambulatory Visit: Payer: Self-pay | Admitting: *Deleted

## 2014-09-18 ENCOUNTER — Encounter: Payer: Self-pay | Admitting: Neurology

## 2014-09-18 MED ORDER — ZONISAMIDE 25 MG PO CAPS: 75.0000 mg | ORAL_CAPSULE | Freq: Every day | ORAL | Status: DC

## 2014-09-23 ENCOUNTER — Other Ambulatory Visit: Payer: Self-pay | Admitting: Family Medicine

## 2014-10-01 ENCOUNTER — Telehealth: Payer: Self-pay | Admitting: Family Medicine

## 2014-10-01 NOTE — Telephone Encounter (Signed)
Pt called and stated she was experiencing chest pain that is radiating down her arm. Pt states she is having no shortness of breath or jaw pain. She states this has been off and on for over a week. Dr. Tomi Bamberger was consulted and does not have any availability today. Pt was offered and appt with JCL and she declined.  Pt was also offered an appt with Tysinger for tomorrow. Again she declined. Pt was informed that if pain increases, she begins to have shortness of breath or jaw pain to go to ER. Pt verbalized understanding and stated if not feeling better she would call us tomorrow and if things changed she would go to the ER.

## 2014-10-02 ENCOUNTER — Ambulatory Visit (INDEPENDENT_AMBULATORY_CARE_PROVIDER_SITE_OTHER): Payer: Medicare Other | Admitting: Podiatry

## 2014-10-02 DIAGNOSIS — M722 Plantar fascial fibromatosis: Secondary | ICD-10-CM

## 2014-10-02 NOTE — Progress Notes (Signed)
Subjective:     Patient ID: Lindsey Rodriguez, female   DOB: 04-20-1966, 49 y.o.   MRN: [id number]  HPI patient presents stating it is improving but still present   Review of Systems     Objective:   Physical Exam Neurovascular status intact with diminishment of discomfort in the plantar heel right but still sore upon palpation    Assessment:     Plantar fasciitis improving with shockwave but still present    Plan:     Shockwave administered 2500 shocks 16 frequency 4.6 intensity

## 2014-10-05 ENCOUNTER — Other Ambulatory Visit: Payer: Self-pay | Admitting: Family Medicine

## 2014-10-29 ENCOUNTER — Other Ambulatory Visit: Payer: Medicare Other

## 2014-10-29 DIAGNOSIS — I10 Essential (primary) hypertension: Secondary | ICD-10-CM

## 2014-10-29 DIAGNOSIS — R5383 Other fatigue: Secondary | ICD-10-CM

## 2014-10-29 DIAGNOSIS — Z5181 Encounter for therapeutic drug level monitoring: Secondary | ICD-10-CM

## 2014-10-29 DIAGNOSIS — E781 Pure hyperglyceridemia: Secondary | ICD-10-CM

## 2014-10-29 LAB — CBC WITH DIFFERENTIAL/PLATELET
Basophils Absolute: 0.1 10*3/uL (ref 0.0–0.1)
Basophils Relative: 1 % (ref 0–1)
Eosinophils Absolute: 0.1 10*3/uL (ref 0.0–0.7)
Eosinophils Relative: 1 % (ref 0–5)
HCT: 38.8 % (ref 36.0–46.0)
Hemoglobin: 12.9 g/dL (ref 12.0–15.0)
Lymphocytes Relative: 37 % (ref 12–46)
Lymphs Abs: 2.1 10*3/uL (ref 0.7–4.0)
MCH: 32.4 pg (ref 26.0–34.0)
MCHC: 33.2 g/dL (ref 30.0–36.0)
MCV: 97.5 fL (ref 78.0–100.0)
MPV: 10.1 fL (ref 8.6–12.4)
Monocytes Absolute: 0.4 10*3/uL (ref 0.1–1.0)
Monocytes Relative: 7 % (ref 3–12)
Neutro Abs: 3.1 10*3/uL (ref 1.7–7.7)
Neutrophils Relative %: 54 % (ref 43–77)
Platelets: 369 10*3/uL (ref 150–400)
RBC: 3.98 MIL/uL (ref 3.87–5.11)
RDW: 12.9 % (ref 11.5–15.5)
WBC: 5.8 10*3/uL (ref 4.0–10.5)

## 2014-10-29 LAB — COMPREHENSIVE METABOLIC PANEL
ALT: 17 U/L (ref 0–35)
AST: 16 U/L (ref 0–37)
Albumin: 4.6 g/dL (ref 3.5–5.2)
Alkaline Phosphatase: 32 U/L — ABNORMAL LOW (ref 39–117)
BUN: 16 mg/dL (ref 6–23)
CO2: 22 mEq/L (ref 19–32)
Calcium: 9.8 mg/dL (ref 8.4–10.5)
Chloride: 106 mEq/L (ref 96–112)
Creat: 0.88 mg/dL (ref 0.50–1.10)
Glucose, Bld: 77 mg/dL (ref 70–99)
Potassium: 4.5 mEq/L (ref 3.5–5.3)
Sodium: 138 mEq/L (ref 135–145)
Total Bilirubin: 0.4 mg/dL (ref 0.2–1.2)
Total Protein: 8 g/dL (ref 6.0–8.3)

## 2014-10-29 LAB — LIPID PANEL
Cholesterol: 194 mg/dL (ref 0–200)
HDL: 39 mg/dL — ABNORMAL LOW (ref >=46)
LDL Cholesterol: 121 mg/dL — ABNORMAL HIGH (ref 0–99)
Total CHOL/HDL Ratio: 5 Ratio
Triglycerides: 172 mg/dL — ABNORMAL HIGH (ref <150)
VLDL: 34 mg/dL (ref 0–40)

## 2014-10-29 LAB — TSH: TSH: 1.556 u[IU]/mL (ref 0.350–4.500)

## 2014-11-11 ENCOUNTER — Ambulatory Visit (INDEPENDENT_AMBULATORY_CARE_PROVIDER_SITE_OTHER): Payer: Medicare Other | Admitting: Neurology

## 2014-11-11 ENCOUNTER — Encounter: Payer: Self-pay | Admitting: Neurology

## 2014-11-11 VITALS — BP 100/60 | HR 74 | Resp 16 | Ht 67.0 in | Wt 197.1 lb

## 2014-11-11 DIAGNOSIS — R200 Anesthesia of skin: Secondary | ICD-10-CM | POA: Diagnosis not present

## 2014-11-11 DIAGNOSIS — G43009 Migraine without aura, not intractable, without status migrainosus: Secondary | ICD-10-CM | POA: Diagnosis not present

## 2014-11-11 DIAGNOSIS — M5416 Radiculopathy, lumbar region: Secondary | ICD-10-CM

## 2014-11-11 DIAGNOSIS — M5010 Cervical disc disorder with radiculopathy, unspecified cervical region: Secondary | ICD-10-CM

## 2014-11-11 DIAGNOSIS — R202 Paresthesia of skin: Secondary | ICD-10-CM

## 2014-11-11 MED ORDER — ZOLMITRIPTAN 5 MG NA SOLN: NASAL | Status: DC

## 2014-11-11 NOTE — Patient Instructions (Addendum)
1.  Will get MRI of cervical spine without contrast to evaluate the numbness in the right hand [facility]  11/26/14 11:45am 2.  Continue zonisamide 75mg  at bedtime 3.  Zomig 5mg  nasal spray.  Spray once in one nostril at earliest onset of headache.  May repeat spray once in 2 hours if needed. 4.  Will contact you with MRI results.  Call if you need a change in Zomig to something else 5.  Follow up in 3 months.

## 2014-11-11 NOTE — Progress Notes (Signed)
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  UPDATE: Migraines improved. Intensity:  6/10 Duration:  4-8 hours Frequency:  Once a week Current abortive therapy:  Extra-strength Tylenol, lorazepam, Zomig 5mg  NS (took once.  Effective but too expensive) Current preventative therapy:  zonisamide 75mg   For 6-8 weeks she notes when she lifts her right arm above shoulder level, it quickly gets week and tingles along the ulnar aspect of her hand and forearm.  She finds it difficult to wash her hair.  She feels that her triceps and 4th and 5th digits of the right hand are weaker.  She also notes numbness along the lateral aspect of the right foot and lower leg.  She has chronic neck pain with history of cervical spine surgery.  She also has history of lumbar problems.  HISTORY: Onset:  Childhood, but worse over the past year Location:  Varies (back of head, crown, bi-temporal, retro-orbital, band-like) Quality:  Retro-orbital throbbing, band-like vice, stabbing at back of head Intensity:  4/10 constant, 8/10 when severe Aura:  no Prodrome:  no Associated symptoms:  Nausea, photophobia, phonophobia, tunnel vision Duration:  Constant but severe episodes 8 hours (with sumatriptan and naproxen) Frequency:  Daily but severe episodes occur once a week Triggers/exacerbating factors:  stress Relieving factors:  Ice pack Activity:  Cannot function at least once a week.  Past abortive therapy:  Relpax 40mg , Treximet, Maxalt, Zomig po, Tramadol Past preventative therapy:  Cymbalta, Topamax, nortriptyline, sertraline, Effexor, biofeedback.  She is uncertain if she tried depakote.  Current abortive  therapy:  sumatriptan 100mg  with naproxen, ibuprofen, Excedrin, tizanidine Current preventative therapy:  none Other current medications:  Lyrica 75mg  twice daily. Gabapentin 800mg , tizanidine  Caffeine:  no Alcohol:  rarely Smoker:  no Diet:  healthy Exercise:  Not lately due to plantar fasciitis Depression/stress:  Stress (on disability due to neuropathy from back surgery and panic attacks which are now better controlled).  Also stress related to home as she lives with her ailing father in law in hospice and brother in law with Down Syndrome. Sleep hygiene:  Poor.  Able to fall asleep but wakes up easily. Family history of headache:  mother  PAST MEDICAL HISTORY: Past Medical History  Diagnosis Date  . Anxiety panic attacks    Lindsey Rodriguez  . Hypertension   . Kidney stones h/o  . Chronic neck pain   . Chronic back pain   . Migraine     MEDICATIONS: Current Outpatient Prescriptions on File Prior to Visit  Medication Sig Dispense Refill  . amLODipine-benazepril (LOTREL) 5-10 MG per capsule TAKE 1 CAPSULE BY MOUTH DAILY. 30 capsule 2  . amLODipine-benazepril (LOTREL) 5-10 MG per capsule TAKE 1 CAPSULE BY MOUTH DAILY. 30 capsule 2  . ARIPiprazole (ABILIFY) 2 MG tablet Take 2 mg by mouth daily.    . Ascorbic Acid (VITAMIN C) 1000 MG tablet Take 1,000 mg by mouth daily.    . B Complex Vitamins (B-COMPLEX/B-12 PO) Take by mouth daily.    . diclofenac (VOLTAREN) 75 MG EC tablet Take 1 tablet (75 mg total) by mouth 2 (two) times daily. 50 tablet 2  . diclofenac sodium (VOLTAREN) 1 % GEL Apply topically 4 (four) times  daily.    . fenofibrate 160 MG tablet TAKE 1 TABLET (160 MG TOTAL) BY MOUTH DAILY. 90 tablet 0  . FOLIC ACID PO Take 1 tablet by mouth daily.    Marland Kitchen gabapentin (NEURONTIN) 800 MG tablet TAKE 1 TABLET (800 MG TOTAL) BY MOUTH AT BEDTIME. 30 tablet 5  . JINTELI 1-5 MG-MCG TABS Take 1 tablet by mouth daily.    Marland Kitchen loratadine (CLARITIN) 10 MG tablet Take 10 mg by mouth daily.      Marland Kitchen  LORazepam (ATIVAN) 0.5 MG tablet Take 1-2 tablets by mouth 3 (three) times daily as needed. Prn anxiety    . LYRICA 75 MG capsule TAKE ONE CAPSULE BY MOUTH TWICE A DAY 60 capsule 3  . Multiple Vitamins-Minerals (MULTIVITAMIN WITH MINERALS) tablet Take 1 tablet by mouth daily.      . naproxen (NAPROSYN) 500 MG tablet Take 1 tablet (500 mg total) by mouth 2 (two) times daily with a meal. Take as needed for pain 60 tablet 1  . Omega-3 Fatty Acids (FISH OIL PO) Take 1,400 mg by mouth daily.    . SUMAtriptan (IMITREX) 100 MG tablet TAKE 1 TAB BY MOUTH EVERY 2 HRS AS NEEDED FOR MIGRAINE (MAX OF 2 TABS IN 24 HRS) 10 tablet 1  . tiZANidine (ZANAFLEX) 2 MG tablet TAKE 1-2 TABLETS (2-4 MG TOTAL) BY MOUTH EVERY 6 (SIX) HOURS AS NEEDED (MUSCLE SPASM). 30 tablet 0  . traMADol (ULTRAM) 50 MG tablet Take 1 tablet (50 mg total) by mouth every 8 (eight) hours as needed. 30 tablet 0  . zonisamide (ZONEGRAN) 25 MG capsule Take 3 capsules (75 mg total) by mouth daily. Take 75 mg at HS 90 capsule 2   No current facility-administered medications on file prior to visit.    ALLERGIES: Allergies  Allergen Reactions  . Penicillins Shortness Of Breath and Rash  . Codeine Other (See Comments)    Manic depressive  . Erythromycin Rash    FAMILY HISTORY: Family History  Problem Relation Age of Onset  . Hypertension Mother   . Depression Mother   . Migraines Mother   . COPD Father   . Heart failure Father   . Breast cancer Cousin     40's  . Cancer Cousin     breast; in her 37's  . Diabetes Neg Hx     SOCIAL HISTORY: History   Social History  . Marital Status: Married    Spouse Name: N/A  . Number of Children: N/A  . Years of Education: N/A   Occupational History  . Not on file.   Social History Main Topics  . Smoking status: Former Smoker    Quit date: 07/04/1987  . Smokeless tobacco: Never Used  . Alcohol Use: 0.0 oz/week    0 Standard drinks or equivalent per week     Comment: ONCE A MONTH    . Drug Use: No  . Sexual Activity:    Partners: Male    Birth Control/ Protection: Other-see comments     Comment: husband had vasectomy   Other Topics Concern  . Not on file   Social History Narrative   Married.  Lives with husband, 1 daughter, father-in-law, brother-in-law (with Down's syndrome), 2 dogs.   2 other children live locally. Disabled due to anxiety.  Previously worked as Public relations account executive.    REVIEW OF SYSTEMS: Constitutional: No fevers, chills, or sweats, no generalized fatigue, change in appetite Eyes: No visual changes, double vision, eye pain Ear, nose  and throat: No hearing loss, ear pain, nasal congestion, sore throat Cardiovascular: No chest pain, palpitations Respiratory:  No shortness of breath at rest or with exertion, wheezes GastrointestinaI: No nausea, vomiting, diarrhea, abdominal pain, fecal incontinence Genitourinary:  No dysuria, urinary retention or frequency Musculoskeletal:  No neck pain, back pain Integumentary: No rash, pruritus, skin lesions Neurological: as above Psychiatric: No depression, insomnia, anxiety Endocrine: No palpitations, fatigue, diaphoresis, mood swings, change in appetite, change in weight, increased thirst Hematologic/Lymphatic:  No anemia, purpura, petechiae. Allergic/Immunologic: no itchy/runny eyes, nasal congestion, recent allergic reactions, rashes  PHYSICAL EXAM: Filed Vitals:   11/11/14 1355  BP: 100/60  Pulse: 74  Resp: 16   General: No acute distress Head:  Normocephalic/atraumatic Eyes:  Fundoscopic exam unremarkable without vessel changes, exudates, hemorrhages or papilledema. Neck: supple, no paraspinal tenderness, full range of motion Heart:  Regular rate and rhythm Lungs:  Clear to auscultation bilaterally Back: No paraspinal tenderness Neurological Exam: alert and oriented to person, place, and time. Attention span and concentration intact, recent and remote memory intact, fund of knowledge intact.   Speech fluent and not dysarthric, language intact.  CN II-XII intact. Fundoscopic exam unremarkable without vessel changes, exudates, hemorrhages or papilledema.  Bulk and tone normal, muscle strength 5/5 throughout (some decreased effort with triceps bilaterally).  Reduced pinprick sensation of the 4th and 5th digits of right hand, ulnar aspect of right hand, and forearm.  Also reduced pinprick sensation involving the dorsum and bottom of right foot and lateral lower leg.  Vibration intact.  Deep tendon reflexes 2+ throughout, toes downgoing.  Finger to nose and heel to shin testing intact.  Gait normal, Romberg negative.  Positive Tinel's sign at right elbow.  IMPRESSION: Migraine without aura, improved Right arm weakness and numbness.  Possible C8-T1 radiculopathy versus ulnar neuropathy Right leg numbness, possibly related to remote lumbosacral radiculopathy (L5-S1)   PLAN: 1.  Will get MRI of cervical spine 2.  Continue zonisamide 75mg  at bedtime 3.  Will provide discount card for Zomig 5mg  NS 4.  Follow up in 3 months.  Metta Clines, DO

## 2014-11-16 ENCOUNTER — Telehealth: Payer: Self-pay | Admitting: *Deleted

## 2014-11-16 NOTE — Telephone Encounter (Signed)
After speaking to [facility] H[facility] this patient does not have a RX plan with this policy . I called her regarding prior auth for Zomig nasal spray . She stated she has a medicare card as well with a RX number she was ask to bring this by so it could be scanned I will need this information to move forward  With PA for medication .

## 2014-11-26 ENCOUNTER — Ambulatory Visit (HOSPITAL_COMMUNITY)
Admission: RE | Admit: 2014-11-26 | Discharge: 2014-11-26 | Disposition: A | Discharge status: Home / Self Care | Payer: Medicare Other | Source: Ambulatory Visit | Attending: Neurology | Admitting: Neurology

## 2014-11-26 DIAGNOSIS — G43009 Migraine without aura, not intractable, without status migrainosus: Secondary | ICD-10-CM

## 2014-11-26 DIAGNOSIS — M5021 Other cervical disc displacement,  high cervical region: Secondary | ICD-10-CM | POA: Diagnosis not present

## 2014-11-26 DIAGNOSIS — M5010 Cervical disc disorder with radiculopathy, unspecified cervical region: Secondary | ICD-10-CM | POA: Diagnosis not present

## 2014-11-26 DIAGNOSIS — R200 Anesthesia of skin: Secondary | ICD-10-CM | POA: Diagnosis not present

## 2014-11-26 DIAGNOSIS — R202 Paresthesia of skin: Secondary | ICD-10-CM

## 2014-11-26 DIAGNOSIS — M5416 Radiculopathy, lumbar region: Secondary | ICD-10-CM

## 2014-11-27 ENCOUNTER — Telehealth: Payer: Self-pay | Admitting: *Deleted

## 2014-11-27 NOTE — Telephone Encounter (Signed)
-----   Message from [place], DO sent at 11/27/2014  7:42 AM EDT ----- Let pt know that MRI cervical spine does show area of disc protrusion affection Ch[place]y.  Tell her Dr. Tomi Likens out of office until next week.  Make appt with him to discuss.

## 2014-11-27 NOTE — Telephone Encounter (Signed)
Left message for patient to return my call regarding   MRI cervical spine does show area of disc protrusion affection [place]. Tell her Dr. Tomi Likens out of office until next week. Make appt with him to discuss.

## 2014-12-02 ENCOUNTER — Other Ambulatory Visit: Payer: Self-pay | Admitting: *Deleted

## 2014-12-02 ENCOUNTER — Telehealth: Payer: Self-pay | Admitting: *Deleted

## 2014-12-02 DIAGNOSIS — R200 Anesthesia of skin: Secondary | ICD-10-CM

## 2014-12-02 NOTE — Telephone Encounter (Signed)
Patient is aware MRI of the cervical spine shows a disc bulge pressing into the spinal cord, however I believe it to be an incidental finding and not contributing to the right arm weakness and numbness. I would recommend scheduling NCV-EMG of right upper extremity. Order for EMG it in system she will make follow up shortly there after

## 2014-12-02 NOTE — Telephone Encounter (Signed)
-----   Message from Pieter Partridge, DO sent at 12/01/2014  3:25 PM EDT ----- MRI of the cervical spine shows a disc bulge pressing into the spinal cord, however I believe it to be an incidental finding and not contributing to the right arm weakness and numbness.  I would recommend scheduling NCV-EMG of right upper extremity. ----- Message -----    From: Rad Results In Interface    Sent: 11/26/2014   2:05 PM      To: Pieter Partridge, DO

## 2014-12-03 ENCOUNTER — Telehealth: Payer: Self-pay | Admitting: Neurology

## 2014-12-03 ENCOUNTER — Ambulatory Visit: Payer: Medicare Other | Admitting: Neurology

## 2014-12-03 ENCOUNTER — Encounter: Payer: Medicare Other | Admitting: Family Medicine

## 2014-12-03 NOTE — Telephone Encounter (Signed)
Called to schedule appt for EMG. Mr. Baptista answered and the call was breaking up. I told him I why I was calling and he got really short with me but I could not understand due to only hearing every other word. He then put Mrs. Bowley on the cell and I told her I will call her back at the other phone number. The phone went silent but appeared to not have hung up. So I talked into the phone stating I will hang up and call back even though I cannot hear her answer. I called back and explained to her the phone connection problems and that I was calling to schedule her EMG. She yelled at me "my father in law passed away last night" I told her " I am so sorry to hear that. I was calling to schedule a test for you, again I am so sorry to hear of your loss would you just like to call back at a better time?" She yelled back "I do not want to schedule that now, BYE" and hung up.

## 2014-12-03 NOTE — Telephone Encounter (Signed)
-----   Message from Vassie Moselle sent at 12/02/2014  9:06 AM EDT -----   ----- Message -----    From: Jodell Cipro Der Glas, LPN    Sent: 10/09/7024   8:49 AM      To: Vassie Moselle  Please schedule her a emg  And then follow up shortly there after thanks  Susie

## 2014-12-08 ENCOUNTER — Ambulatory Visit (INDEPENDENT_AMBULATORY_CARE_PROVIDER_SITE_OTHER): Payer: Medicare Other | Admitting: Neurology

## 2014-12-08 DIAGNOSIS — R202 Paresthesia of skin: Secondary | ICD-10-CM | POA: Diagnosis not present

## 2014-12-08 DIAGNOSIS — R200 Anesthesia of skin: Secondary | ICD-10-CM

## 2014-12-08 NOTE — Procedures (Signed)
[facility]  [address], [address]  [place], [place] [address] Tel: [phone] Fax:  [phone] Test Date:  12/08/2014  Patient: Lindsey Rodriguez DOB: 05/09/1966 Physician: Narda Amber  Sex: Female Height: 5\' 7"  Ref Phys: Metta Clines  ID#: [id number] Temp: 33.9C Technician: Jerilynn Mages. Dean   Patient Complaints: This is a 49 year-old female presenting for evaluation of weakness and paresthesias of her right upper extremity. She has a history of cervical decompression and fusion.   NCV & EMG Findings: Extensive electrodiagnostic testing of the right upper extremity shows:  1. Right median, ulnar, radial, and palmar sensory responses are within normal limits.  2. Right median and ulnar motor responses are within normal limits.  3. There is no evidence of active or chronic motor axon loss changes affecting any of the tested muscles. Motor unit configuration and recruitment pattern is within normal limits.   Impression: This is a normal study of the right upper extremity.   In particular, there is no evidence of carpal tunnel syndrome, ulnar neuropathy, or cervical radiculopathy affecting the right upper extremity.   ___________________________ Narda Amber    Nerve Conduction Studies Anti Sensory Summary Table   Stim Site NR Peak (ms) Norm Peak (ms) P-T Amp (V) Norm P-T Amp  Right Median Anti Sensory (2nd Digit)  33.9C  Wrist    2.6 <3.4 33.7 >20  Right Radial Anti Sensory (Base 1st Digit)  33.9C  Wrist    1.8 <2.7 42.6 >18  Right Ulnar Anti Sensory (5th Digit)  33.9C  Wrist    3.0 <3.1 40.1 >12   Motor Summary Table   Stim Site NR Onset (ms) Norm Onset (ms) O-P Amp (mV) Norm O-P Amp Site1 Site2 Delta-0 (ms) Dist (cm) Vel (m/s) Norm Vel (m/s)  Right Median Motor (Abd Poll Brev)  33.9C  Wrist    2.7 <3.9 6.2 >6 Elbow Wrist 3.9 24.5 63 >50  Elbow    6.6  5.8         Right Ulnar Motor (Abd Dig Minimi)  33.9C  Wrist    2.3 <3.1 8.8 >7 B Elbow Wrist 3.6 20.0  56 >50  B Elbow    5.9  8.8  A Elbow B Elbow 1.5 10.0 67 >50  A Elbow    7.4  8.6          Comparison Summary Table   Stim Site NR Peak (ms) Norm Peak (ms) P-T Amp (V) Site1 Site2 Delta-P (ms) Norm Delta (ms)  Right Median/Ulnar Palm Comparison (Wrist - 8cm)  33.9C  Median Palm    1.6 <2.2 101.6 Median Palm Ulnar Palm 0.1   Ulnar Palm    1.7 <2.2 54.3       EMG   Side Muscle Ins Act Fibs Psw Fasc Number Recrt Dur Dur. Amp Amp. Poly Poly. Comment  Right 1stDorInt Nml Nml Nml Nml Nml Nml Nml Nml Nml Nml Nml Nml N/A  Right Ext Indicis Nml Nml Nml Nml Nml Nml Nml Nml Nml Nml Nml Nml N/A  Right PronatorTeres Nml Nml Nml Nml Nml Nml Nml Nml Nml Nml Nml Nml N/A  Right Biceps Nml Nml Nml Nml Nml Nml Nml Nml Nml Nml Nml Nml N/A  Right Triceps Nml Nml Nml Nml Nml Nml Nml Nml Nml Nml Nml Nml N/A  Right Deltoid Nml Nml Nml Nml Nml Nml Nml Nml Nml Nml Nml Nml N/A      Waveforms:

## 2014-12-09 ENCOUNTER — Telehealth: Payer: Self-pay | Admitting: Neurology

## 2014-12-09 NOTE — Telephone Encounter (Signed)
Discussed results or NCV and MRI of cervical spine.  NCV was normal.  MRI shows disc protrusion pressing a little on the left side of the spinal cord.  There is no cord signal abnormality.  This wouldn't account for her symptoms so I think it is an incidental finding.  I suggested monitoring it for now and to call with any worsening symptoms.

## 2014-12-17 ENCOUNTER — Other Ambulatory Visit: Payer: Self-pay | Admitting: Neurology

## 2014-12-25 ENCOUNTER — Other Ambulatory Visit: Payer: Self-pay | Admitting: Family Medicine

## 2015-01-06 ENCOUNTER — Telehealth: Payer: Self-pay | Admitting: *Deleted

## 2015-01-06 NOTE — Telephone Encounter (Signed)
Pt states it feels like her ankle has shifted.  I transferred pt to schedulers, and pt agreed.

## 2015-01-07 ENCOUNTER — Ambulatory Visit (INDEPENDENT_AMBULATORY_CARE_PROVIDER_SITE_OTHER): Payer: Medicare Other

## 2015-01-07 ENCOUNTER — Encounter: Payer: Self-pay | Admitting: Podiatry

## 2015-01-07 ENCOUNTER — Ambulatory Visit (INDEPENDENT_AMBULATORY_CARE_PROVIDER_SITE_OTHER): Payer: Medicare Other | Admitting: Podiatry

## 2015-01-07 DIAGNOSIS — M779 Enthesopathy, unspecified: Secondary | ICD-10-CM

## 2015-01-07 DIAGNOSIS — M722 Plantar fascial fibromatosis: Secondary | ICD-10-CM

## 2015-01-07 MED ORDER — DICLOFENAC SODIUM 75 MG PO TBEC: 75.0000 mg | DELAYED_RELEASE_TABLET | Freq: Two times a day (BID) | ORAL | Status: DC

## 2015-01-07 MED ORDER — TRIAMCINOLONE ACETONIDE 10 MG/ML IJ SUSP
10.0000 mg | Freq: Once | INTRAMUSCULAR | Status: AC
  Administered 2015-01-07: 10 mg

## 2015-01-07 NOTE — Progress Notes (Signed)
Subjective:     Patient ID: Lindsey Rodriguez, female   DOB: 01-04-1966, 49 y.o.   MRN: [id number]  HPI patient states I'm having a lot of pain on the inside of my right ankle and the heel that you worked on is doing very well area patient's been quite active but does not remember specific injury   Review of Systems     Objective:   Physical Exam Neurovascular status intact muscle strength adequate with inflammation around the posterior tibial insertion proximal to its insertion into the navicular. I checked muscle strength found to be adequate and does not appear to be a tear of the tendon    Assessment:     Probable posterior tibial tendinitis right with foot structure and healing from her posterior heel pain probably a part of the problem    Plan:     Reviewed condition and explained that there may be an interstitial tear but most likely it's inflammation and since she's getting ready to go to [place] I did a very careful sheath injection 3 mg dexamethasone Kenalog 5 mill grams Xylocaine and applied fascial brace with instructions on usage. Reappoint to reevaluate in 1 week after she returns from her trip

## 2015-01-15 ENCOUNTER — Ambulatory Visit (INDEPENDENT_AMBULATORY_CARE_PROVIDER_SITE_OTHER): Payer: Medicare Other | Admitting: Podiatry

## 2015-01-15 ENCOUNTER — Encounter: Payer: Self-pay | Admitting: Podiatry

## 2015-01-15 VITALS — BP 119/74 | HR 81 | Resp 15

## 2015-01-15 DIAGNOSIS — M722 Plantar fascial fibromatosis: Secondary | ICD-10-CM | POA: Diagnosis not present

## 2015-01-15 DIAGNOSIS — M779 Enthesopathy, unspecified: Secondary | ICD-10-CM | POA: Diagnosis not present

## 2015-01-20 ENCOUNTER — Telehealth: Payer: Self-pay | Admitting: Family Medicine

## 2015-01-20 ENCOUNTER — Encounter: Payer: Self-pay | Admitting: Family Medicine

## 2015-01-20 ENCOUNTER — Ambulatory Visit (INDEPENDENT_AMBULATORY_CARE_PROVIDER_SITE_OTHER): Payer: Medicare Other | Admitting: Family Medicine

## 2015-01-20 VITALS — BP 118/74 | HR 76 | Temp 99.2°F | Ht 66.25 in | Wt 191.8 lb

## 2015-01-20 DIAGNOSIS — R519 Headache, unspecified: Secondary | ICD-10-CM

## 2015-01-20 DIAGNOSIS — M542 Cervicalgia: Secondary | ICD-10-CM

## 2015-01-20 DIAGNOSIS — Z0000 Encounter for general adult medical examination without abnormal findings: Secondary | ICD-10-CM | POA: Diagnosis not present

## 2015-01-20 DIAGNOSIS — E781 Pure hyperglyceridemia: Secondary | ICD-10-CM

## 2015-01-20 DIAGNOSIS — I10 Essential (primary) hypertension: Secondary | ICD-10-CM

## 2015-01-20 DIAGNOSIS — R5382 Chronic fatigue, unspecified: Secondary | ICD-10-CM

## 2015-01-20 DIAGNOSIS — J069 Acute upper respiratory infection, unspecified: Secondary | ICD-10-CM

## 2015-01-20 DIAGNOSIS — R51 Headache: Secondary | ICD-10-CM | POA: Diagnosis not present

## 2015-01-20 DIAGNOSIS — G629 Polyneuropathy, unspecified: Secondary | ICD-10-CM

## 2015-01-20 DIAGNOSIS — G43009 Migraine without aura, not intractable, without status migrainosus: Secondary | ICD-10-CM

## 2015-01-20 LAB — POCT URINALYSIS DIPSTICK
Bilirubin, UA: NEGATIVE
Glucose, UA: NEGATIVE
Ketones, UA: NEGATIVE
Nitrite, UA: NEGATIVE
Protein, UA: NEGATIVE
Spec Grav, UA: 1.015
Urobilinogen, UA: NEGATIVE
pH, UA: 6

## 2015-01-20 MED ORDER — AMLODIPINE BESY-BENAZEPRIL HCL 5-10 MG PO CAPS: 1.0000 | ORAL_CAPSULE | Freq: Every day | ORAL | Status: DC

## 2015-01-20 MED ORDER — GABAPENTIN 800 MG PO TABS: ORAL_TABLET | ORAL | Status: DC

## 2015-01-20 MED ORDER — TIZANIDINE HCL 2 MG PO TABS: ORAL_TABLET | ORAL | Status: DC

## 2015-01-20 MED ORDER — FENOFIBRATE 160 MG PO TABS: ORAL_TABLET | ORAL | Status: DC

## 2015-01-20 MED ORDER — PREGABALIN 75 MG PO CAPS
75.0000 mg | ORAL_CAPSULE | Freq: Two times a day (BID) | ORAL | Status: DC
Start: 2015-01-20 — End: 2015-03-19

## 2015-01-20 NOTE — Telephone Encounter (Signed)
Lyrica Rx 75 mg # 60 with 5 refills phoned to [facility] per pt request. She left written Rx in office when she was seen earlier today

## 2015-01-20 NOTE — Patient Instructions (Addendum)
  HEALTH MAINTENANCE RECOMMENDATIONS:  It is recommended that you get at least 30 minutes of aerobic exercise at least 5 days/week (for weight loss, you may need as much as 60-90 minutes). This can be any activity that gets your heart rate up. This can be divided in 10-15 minute intervals if needed, but try and build up your endurance at least once a week.  Weight bearing exercise is also recommended twice weekly.  Eat a healthy diet with lots of vegetables, fruits and fiber.  "Colorful" foods have a lot of vitamins (ie green vegetables, tomatoes, red peppers, etc).  Limit sweet tea, regular sodas and alcoholic beverages, all of which has a lot of calories and sugar.  Up to 1 alcoholic drink daily may be beneficial for women (unless trying to lose weight, watch sugars).  Drink a lot of water.  Calcium recommendations are 1200-1500 mg daily (1500 mg for postmenopausal women or women without ovaries), and vitamin D 1000 IU daily.  This should be obtained from diet and/or supplements (vitamins), and calcium should not be taken all at once, but in divided doses.  Monthly self breast exams and yearly mammograms for women over the age of 83 is recommended.  Sunscreen of at least SPF 30 should be used on all sun-exposed parts of the skin when outside between the hours of 10 am and 4 pm (not just when at beach or pool, but even with exercise, golf, tennis, and yard work!)  Use a sunscreen that says "broad spectrum" so it covers both UVA and UVB rays, and make sure to reapply every 1-2 hours.  Remember to change the batteries in your smoke detectors when changing your clock times in the spring and fall.  Use your seat belt every time you are in a car, and please drive safely and not be distracted with cell phones and texting while driving.  Continue decongestants as needed, along with adding Mucinex and doing sinus rinses.  Call next week if symptoms are worse (discolored mucus, ongoing sinus pain)  Return if  increasing cough, shortness of breath.

## 2015-01-20 NOTE — Progress Notes (Signed)
Chief Complaint  Patient presents with  . Annual Exam    nonfasting annual exam without pap-sees Dr.Horvath and is UTD/med check. UA showed 1+ leuks and trace blood. Did not do eye exam as she just had one with [facility]. Is having some sinus issue that started this past weekend. Mucus is thicker than usual and sometimes yellow colored, PND is causing her throat to be sore. No fevers. Feels like claritin is not working.    Lindsey Rodriguez is a 49 y.o. female who presents for a complete physical.  She has the following concerns:  4 days ago she started with postnasal drainage, sinus pressure (both cheeks, under her eyes).  Nasal mucus is clear, slightly yellow, sometimes a tinge of green.  She has a slight dry cough, from PND.  Denies ear pain.  +sore throat.  No sick contacts.  She has been taking her daily claritin (chronically), as well as tylenol allergy and sinus when the sinus pressure was worse--this helped the sinus pain.  No sniffling, sneezing.   She is asking for referral to Dr. Estanislado Pandy for chronic fatigue syndrome. She has chronic fatigue, but feels like her energy has been worse over the last 6 months.  She does not have a diagnosis of fibromyalgia, but does have pain (neck, RUE, back, down the right leg).   Hypertension follow-up: Blood pressures elsewhere are 118-126's/70's. Denies dizziness, chest pain. Denies side effects of medications; no cough, edema.  Seeing podiatrist for right foot tendinitis, plantar fasciitis, last seen last week.  It has been improving.  Exercise has been limited due to pain.  Last week she walked a lot in [place], wearing brace, and only had mild pain.  See neurologist [facility]) for headaches. These are much better since on daily prevention. She also has h/o neuropathy in feet.  She was also having right arm pain and weakness, for which  she had NCV and EMG last month, which was normal (done of RUE only). 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 There is mild deflection of the cord to the right. Status post C4-T2 fusion. The central canal and foramina appear open at each level.  Immunization History  Administered Date(s) Administered  . Influenza Split 03/18/2011, 04/02/2014, 05/01/2014  . Influenza Whole 07/03/2009  . Influenza, Seasonal, Injecte, Preservative Fre 06/04/2012  . Influenza,inj,Quad PF,36+ Mos 03/10/2013  . Tdap 09/05/2012   Last Pap smear: per Dr. Philis Pique, approx 10/2014 Last mammogram: 05/2014 Last colonoscopy: 12/2012, had polyp (due again 5 years, per pt) Last DEXA: never Dentist: twice yearly Ophtho: yearly Exercise: "not enough", not much  Past Medical History  Diagnosis Date  . Anxiety panic attacks    [name]  . Hypertension   . Kidney stones h/o  . Chronic neck pain   . Chronic back pain   . Migraine   . Plantar fasciitis right  . Tendonitis of foot right    Past Surgical History  Procedure Laterality Date  . Neck surgery  [name]  Summer 2011    X4  . Back surgery  Dr.Stern, last sx Summer 2011    X4  . Tonsillectomy    . Rhinoplasty      History   Social History  . Marital Status: Married    Spouse Name: N/A  . Number of Children: N/A  . Years of Education: N/A   Occupational History  .  Not on file.   Social History Main Topics  . Smoking status: Former Smoker    Quit date: 07/04/1987  . Smokeless tobacco: Never Used  . Alcohol Use: 0.0 oz/week    0 Standard drinks or equivalent per week     Comment: ONCE A MONTH  . Drug Use: No  . Sexual Activity:    Partners: Male    Birth Control/ Protection: Other-see comments     Comment: husband had vasectomy   Other Topics Concern  . Not on file   Social History Narrative   Married.  Lives with husband, 1 daughter, brother-in-law (with Down's syndrome), 2 dogs.   2 other children live locally.  Disabled due to anxiety.  Previously worked as Public relations account executive.    Family History  Problem Relation Age of Onset  . Hypertension Mother   . Depression Mother   . Migraines Mother   . Hypothyroidism Mother   . COPD Father   . Heart failure Father   . Breast cancer Cousin     40's  . Cancer Cousin     breast; in her 24's  . Diabetes Neg Hx     Outpatient Encounter Prescriptions as of 01/20/2015  Medication Sig Note  . amLODipine-benazepril (LOTREL) 5-10 MG per capsule Take 1 capsule by mouth daily.   . Ascorbic Acid (VITAMIN C) 1000 MG tablet Take 1,000 mg by mouth daily.   . B Complex Vitamins (B-COMPLEX/B-12 PO) Take by mouth daily.   Marland Kitchen buPROPion (WELLBUTRIN XL) 150 MG 24 hr tablet  11/11/2014: Received from: External Pharmacy  . Calcium Carb-Cholecalciferol (CALCIUM PLUS VITAMIN D3) 600-800 MG-UNIT TABS Take 1 tablet by mouth daily.   . fenofibrate 160 MG tablet TAKE 1 TABLET (160 MG TOTAL) BY MOUTH DAILY.   Marland Kitchen FLUoxetine (PROZAC) 40 MG capsule Take 40 mg by mouth daily.  11/11/2014: Received from: External Pharmacy  . FOLIC ACID PO Take 1 tablet by mouth daily.   Marland Kitchen gabapentin (NEURONTIN) 800 MG tablet TAKE 1 TABLET (800 MG TOTAL) BY MOUTH AT BEDTIME.   Marland Kitchen JINTELI 1-5 MG-MCG TABS Take 1 tablet by mouth daily. 03/10/2013: Dr. Philis Pique  . loratadine (CLARITIN) 10 MG tablet Take 10 mg by mouth daily.   09/11/2013: Takes prn allergies  . LORazepam (ATIVAN) 0.5 MG tablet Take 1-2 tablets by mouth 3 (three) times daily as needed. Prn anxiety 03/10/2013: Using 1-2x/day, rx'd by [name]  . Multiple Vitamins-Minerals (MULTIVITAMIN WITH MINERALS) tablet Take 1 tablet by mouth daily.     . pregabalin (LYRICA) 75 MG capsule Take 1 capsule (75 mg total) by mouth 2 (two) times daily.   Marland Kitchen tiZANidine (ZANAFLEX) 2 MG tablet TAKE 1-2 TABLETS (2-4 MG TOTAL) BY MOUTH EVERY 6 (SIX) HOURS AS NEEDED (MUSCLE SPASM).   Marland Kitchen zolmitriptan (ZOMIG) 5 MG nasal solution 1 spray in one nostril at earliest onset of  headache.  May repeat once in 2 hours if needed.   . zonisamide (ZONEGRAN) 25 MG capsule Take 3 capsules (75 mg total) by mouth daily. Take 75 mg at [facility] 01/20/2015: For migraine prevention  . [DISCONTINUED] amLODipine-benazepril (LOTREL) 5-10 MG per capsule TAKE 1 CAPSULE BY MOUTH DAILY.   . [DISCONTINUED] fenofibrate 160 MG tablet TAKE 1 TABLET (160 MG TOTAL) BY MOUTH DAILY.   . [DISCONTINUED] gabapentin (NEURONTIN) 800 MG tablet TAKE 1 TABLET (800 MG TOTAL) BY MOUTH AT BEDTIME.   . [DISCONTINUED] LYRICA 75 MG capsule TAKE ONE CAPSULE BY MOUTH TWICE A DAY   . [  DISCONTINUED] tiZANidine (ZANAFLEX) 2 MG tablet TAKE 1-2 TABLETS (2-4 MG TOTAL) BY MOUTH EVERY 6 (SIX) HOURS AS NEEDED (MUSCLE SPASM). 01/20/2015: Using it once or twice a week  . diclofenac sodium (VOLTAREN) 1 % GEL Apply topically 4 (four) times daily.   . Omega-3 Fatty Acids (FISH OIL PO) Take 1,400 mg by mouth daily.   . [DISCONTINUED] ARIPiprazole (ABILIFY) 2 MG tablet Take 2 mg by mouth daily.   . [DISCONTINUED] naproxen (NAPROSYN) 500 MG tablet Take 1 tablet (500 mg total) by mouth 2 (two) times daily with a meal. Take as needed for pain 06/01/2014: Takes about 3x/week, as needed for neck pain/headache  . [DISCONTINUED] SUMAtriptan (IMITREX) 100 MG tablet TAKE 1 TAB BY MOUTH EVERY 2 HRS AS NEEDED FOR MIGRAINE (MAX OF 2 TABS IN 24 HRS) 02/19/2014: Uses this or Relpax, depending on availability  . [DISCONTINUED] traMADol (ULTRAM) 50 MG tablet Take 1 tablet (50 mg total) by mouth every 8 (eight) hours as needed.    No facility-administered encounter medications on file as of 01/20/2015.    Allergies  Allergen Reactions  . Penicillins Shortness Of Breath and Rash  . Codeine Other (See Comments)    Manic depressive  . Voltaren [Diclofenac] Nausea Only and Other (See Comments)    Pt stated "tore stomach up"  . Erythromycin Rash    ROS:  The patient denies anorexia, fever, weight changes, headaches (see HPI, improved),  vision changes,  decreased hearing, ear pain, sore throat, breast concerns, chest pain, palpitations, dizziness, syncope, dyspnea on exertion, cough, swelling, nausea, vomiting, diarrhea, constipation, abdominal pain, melena, hematochezia, indigestion/heartburn, hematuria, incontinence, dysuria, irregular menstrual cycles (on OCP's, absent cycles), vaginal discharge, odor or itch, genital lesions, joint pains (just the right foot/ankle, improving), numbness, tingling (has in feet, but controlled/improved), weakness, tremor, suspicious skin lesions, depression, anxiety, abnormal bleeding/bruising, or enlarged lymph nodes. Moods are well controlled.  Stressors overall are much less (father-in-law passed away in Jan 25, 2023, son is out of the house). +URI symptoms as per HPI Had some nausea and diarrhea related to voltaren, resolved after switching to topical. Neuropathy in the feet is controlled--Lyrica has helped, able to tolerate taking it twice daily, and she also takes the gabapentin, but can only tolerate taking it at bedtime (due to sedation) She has RUE pain, but that has improved some.  Right ankle/heel pain has improved (wears brace). She has neck pain (mainly right sided), for which she takes the tizanidine once or twice weekly with good results. Headaches are once a week (much improved), uses ice packs, sleep; zomig sparingly due to cost.   PHYSICAL EXAM:  BP 118/74 mmHg  Pulse 76  Temp(Src) 99.2 F (37.3 C) (Tympanic)  Ht 5' 6.25" (1.683 m)  Wt 191 lb 12.8 oz (87 kg)  BMI 30.72 kg/m2  General Appearance:    Alert, cooperative, no distress, appears stated age  Head:    Normocephalic, without obvious abnormality, atraumatic  Eyes:    PERRL, conjunctiva/corneas clear, EOM's intact, fundi    benign  Ears:    Normal TM's and external ear canals  Nose:   Nares normal, mucosa is mildly edematous, without erythema or purulence. She has mild bilateral maxillary sinus tenderness  Throat:   Lips, mucosa, and tongue  normal; teeth and gums normal  Neck:   Supple, no lymphadenopathy;  thyroid:  no   enlargement/tenderness/nodules; no carotid   bruit or JVD  Back:    Spine nontender, no curvature, ROM normal, no CVA  tenderness  Lungs:     Clear to auscultation bilaterally without wheezes, rales or     ronchi; respirations unlabored  Chest Wall:    No tenderness or deformity   Heart:    Regular rate and rhythm, S1 and S2 normal, no murmur, rub   or gallop  Breast Exam:    Deferred to GYN  Abdomen:     Soft, non-tender, nondistended, normoactive bowel sounds,    no masses, no hepatosplenomegaly  Genitalia:    Deferred to GYN     Extremities:   No clubbing, cyanosis or edema. Brace on right ankle  Pulses:   2+ and symmetric all extremities  Skin:   Skin color, texture, turgor normal, no rashes or lesions  Lymph nodes:   Cervical, supraclavicular, and axillary nodes normal  Neurologic:   CNII-XII intact, normal strength, sensation and gait; reflexes 2+ and symmetric throughout          Psych:   Normal mood, affect, hygiene and grooming.    Urine dip:  Small leuks.    Chemistry      Component Value Date/Time   NA 138 10/29/2014 0001   K 4.5 10/29/2014 0001   CL 106 10/29/2014 0001   CO2 22 10/29/2014 0001   BUN 16 10/29/2014 0001   CREATININE 0.88 10/29/2014 0001   CREATININE 0.90 02/28/2011 0938      Component Value Date/Time   CALCIUM 9.8 10/29/2014 0001   ALKPHOS 32* 10/29/2014 0001   AST 16 10/29/2014 0001   ALT 17 10/29/2014 0001   BILITOT 0.4 10/29/2014 0001     Fasting glucose 77  Lab Results  Component Value Date   CHOL 194 10/29/2014   HDL 39* 10/29/2014   LDLCALC 121* 10/29/2014   TRIG 172* 10/29/2014   CHOLHDL 5.0 10/29/2014   Lab Results  Component Value Date   WBC 5.8 10/29/2014   HGB 12.9 10/29/2014   HCT 38.8 10/29/2014   MCV 97.5 10/29/2014   PLT 369 10/29/2014   Lab Results  Component Value Date   TSH 1.556 10/29/2014    ASSESSMENT/PLAN:  Annual  physical exam - Plan: POCT Urinalysis Dipstick  Essential hypertension, benign - controlled - Plan: amLODipine-benazepril (LOTREL) 5-10 MG per capsule  Nonintractable headache, unspecified chronicity pattern, unspecified headache type - improved on current regimen from neurologist - Plan: tiZANidine (ZANAFLEX) 2 MG tablet  Neuropathy - stable.May need prior auth for Lyrica. Can only tolerate gabapentin qHS, able to take lyrica BID - Plan: gabapentin (NEURONTIN) 800 MG tablet, pregabalin (LYRICA) 75 MG capsule, Ambulatory referral to Rheumatology  Pure hyperglyceridemia - TG slightly high, drop in HDL since less exercise due to ankle, raising her chol/HDL ratio. diet/exercise reviewed. increase fish oil - Plan: fenofibrate 160 MG tablet, Lipid panel, Hepatic function panel  Chronic fatigue - cannot r/o side effects from meds; refer to Dr. Estanislado Pandy as requested. She also has chronic pain. - Plan: Ambulatory referral to Rheumatology  Migraine without aura and without status migrainosus, not intractable  Neck pain - Plan: Ambulatory referral to Rheumatology  Acute upper respiratory infection - supportive measures reviewed. No bacterial infection noted. s/sx reviewed   Discussed monthly self breast exams and yearly mammograms after the age of 28; at least 30 minutes of aerobic activity at least 5 days/week; proper sunscreen use reviewed; healthy diet, including goals of calcium and vitamin D intake and alcohol recommendations (less than or equal to 1 drink/day) reviewed; regular seatbelt use; changing batteries in smoke detectors.  Immunization recommendations discussed--UTD, yearly flu shots recommended.  Colonoscopy recommendations reviewed, UTD.  Labs reviewed.  Recommended increasing fish oil to 3000-4000mg  daily, reviewed lowfat, low cholesterol diet.  Continue current meds otherwise and rehcekc in 6 mos.   Refer to Dr. Katherine Mantle is also a patient there. Friends with a nurse who  used to work for her [employer]).  URI--continue decongestants as needed (as BP is well controlled). Use mucinex prn, sinus rinses. Reviewed s/sx of infection and to call/return if worse  F/u 6 months, fasting labs prior

## 2015-01-26 ENCOUNTER — Encounter: Payer: Self-pay | Admitting: Family Medicine

## 2015-01-26 ENCOUNTER — Telehealth: Payer: Self-pay | Admitting: Family Medicine

## 2015-01-26 NOTE — Telephone Encounter (Signed)
Called pharmacy & verified no P.A. Is required for Lyrica & copay is $72.  Called [employer] & they do not do copay exceptions over the phone.  Must file appeal to fax# [phone] Attn: Administrative Appeals

## 2015-01-26 NOTE — Telephone Encounter (Signed)
Appeal letter was typed and faxed

## 2015-02-02 ENCOUNTER — Encounter: Payer: Self-pay | Admitting: Family Medicine

## 2015-02-03 NOTE — Telephone Encounter (Signed)
Recv'd fax back regarding appeal stating [facility] does not handle these reviews for State retired plan.  Called customer service [phone] and they referred me back to [facility] [phone].  Called them & reached the Benefit Coverage Review dept at [phone] & was advised they do not do co pay reductions with the State retired plan.  Called pt and informed her and suggested she should try Patient Assistance.  She will try.

## 2015-02-26 ENCOUNTER — Telehealth: Payer: Self-pay | Admitting: Family Medicine

## 2015-02-26 NOTE — Telephone Encounter (Signed)
Pt dropped off Pt Assistance forms for Lyrica.  She was given 3 packs Lyrica samples ok per JCL.   Need written Rx for Lyrica for 90 days to include with form

## 2015-02-26 NOTE — Telephone Encounter (Signed)
[place] for 90d rx

## 2015-03-01 NOTE — Telephone Encounter (Signed)
Written and given to Mickel Baas, she has form. Called patient and she dropped off form with her this past Friday.

## 2015-03-02 ENCOUNTER — Encounter: Payer: Self-pay | Admitting: Neurology

## 2015-03-02 ENCOUNTER — Ambulatory Visit (INDEPENDENT_AMBULATORY_CARE_PROVIDER_SITE_OTHER): Payer: Medicare Other | Admitting: Neurology

## 2015-03-02 VITALS — BP 116/60 | HR 76 | Ht 66.0 in | Wt 194.0 lb

## 2015-03-02 DIAGNOSIS — M5090 Cervical disc disorder, unspecified, unspecified cervical region: Secondary | ICD-10-CM | POA: Diagnosis not present

## 2015-03-02 DIAGNOSIS — M47812 Spondylosis without myelopathy or radiculopathy, cervical region: Secondary | ICD-10-CM | POA: Insufficient documentation

## 2015-03-02 DIAGNOSIS — G43719 Chronic migraine without aura, intractable, without status migrainosus: Secondary | ICD-10-CM | POA: Insufficient documentation

## 2015-03-02 MED ORDER — PROPRANOLOL HCL 40 MG PO TABS: 40.0000 mg | ORAL_TABLET | Freq: Two times a day (BID) | ORAL | Status: DC

## 2015-03-02 NOTE — Progress Notes (Signed)
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  MRI of cervical spine reviewed.  UPDATE: She was just diagnosed with fibromyalgia by rheumatology.  She is under stress because blood work also came back abnormal for her SPEP/IFE, so she is concerned about multiple myeloma.  I do not have these labs.  She continues to feel fatigue and headaches are worse.  She still has the daily headaches, but the severe headaches occur now 3-4 days per week. Current abortive medication:  Zomig 55m NS (not too effective) Antihypertensive medications:  amlodipine Antidepressant medications:  wellbutrin Anticonvulsant medications:  zonisamide 756m Lyrica 7559mwice daily, gabapentin 800m59mtamins/Herbal/Supplements:  Magnesium maleate 1250mg34mce daily  To further evaluate numbness and weakness in right arm, he had an MRI of the cervical spine performed on 11/26/14, which showed post surgical changes from C4-T2 fusion.  There was also left disc protrusion at C3-4 where it deforms the left hemi-cord and deflects the cord to the right.  She had a NCV-EMG performed on 12/08/14, which was normal. a NCV-EMG was performed on 12/08/14, which was normal.   Her wrist still hurts.  She is seeing an orthopedist.  HISTORY: Onset:  Childhood, but worse over the past year Location:  Varies (back of head, crown, bi-temporal, retro-orbital, band-like) Quality:  Retro-orbital throbbing, band-like vice, stabbing at back of head Initial Intensity:  4/10 constant, 8/10 when severe; May 6/10 Aura:  no Prodrome:  no Associated symptoms:  Nausea, photophobia, phonophobia, tunnel vision Initial Duration:  Constant but severe episodes 8 hours (with sumatriptan and  naproxen); May 4-8 hours Initial Frequency:  Daily but severe episodes occur once a week; May once a week Triggers/exacerbating factors:  stress Relieving factors:  Ice pack Activity:  Cannot function at least once a week.  Past abortive therapy:  Relpax 40mg,40mximet, Maxalt, Zomig po, Tramadol, sumatriptan 100mg w23mnaproxen, sumatriptan NS Past preventative therapy:  Cymbalta, Topamax, nortriptyline, sertraline, Effexor, biofeedback.  She is uncertain if she tried depakote.  Caffeine:  no Alcohol:  rarely Smoker:  no Diet:  healthy Exercise:  Not lately due to plantar fasciitis Depression/stress:  Stress (on disability due to neuropathy from back surgery and panic attacks which are now better controlled).  Also stress related to home as she lives with her ailing father in law in hospice and brother in law with Down Syndrome. Sleep hygiene:  Poor.  Able to fall asleep but wakes up easily. Family history of headache:  mother  In March, she noted that when she lifts her right arm above shoulder level, it quickly gets week and tingles along the ulnar aspect of her hand and forearm.  She finds it difficult to wash her hair.  She feels that her triceps and 4th and 5th digits of the right hand are weaker.  She has chronic neck pain with history of cervical spine surgery.    PAST MEDICAL HISTORY: Past Medical History  Diagnosis Date  . Anxiety panic attacks    Dr. Kaur  .Toy Careertension   . Kidney stones h/o  . Chronic neck pain   . Chronic back pain   . Migraine   . Plantar fasciitis right  . Tendonitis of foot right    MEDICATIONS: Current Outpatient  Prescriptions on File Prior to Visit  Medication Sig Dispense Refill  . amLODipine-benazepril (LOTREL) 5-10 MG per capsule Take 1 capsule by mouth daily. 30 capsule 5  . Ascorbic Acid (VITAMIN C) 1000 MG tablet Take 1,000 mg by mouth daily.    . B Complex Vitamins (B-COMPLEX/B-12 PO) Take by mouth daily.    Marland Kitchen buPROPion (WELLBUTRIN  XL) 150 MG 24 hr tablet     . Calcium Carb-Cholecalciferol (CALCIUM PLUS VITAMIN D3) 600-800 MG-UNIT TABS Take 1 tablet by mouth daily.    . diclofenac sodium (VOLTAREN) 1 % GEL Apply topically 4 (four) times daily.    . fenofibrate 160 MG tablet TAKE 1 TABLET (160 MG TOTAL) BY MOUTH DAILY. 90 tablet 1  . FLUoxetine (PROZAC) 40 MG capsule Take 40 mg by mouth daily.     Marland Kitchen FOLIC ACID PO Take 1 tablet by mouth daily.    Marland Kitchen gabapentin (NEURONTIN) 800 MG tablet TAKE 1 TABLET (800 MG TOTAL) BY MOUTH AT BEDTIME. 30 tablet 5  . JINTELI 1-5 MG-MCG TABS Take 1 tablet by mouth daily.    Marland Kitchen loratadine (CLARITIN) 10 MG tablet Take 10 mg by mouth daily.      Marland Kitchen LORazepam (ATIVAN) 0.5 MG tablet Take 1-2 tablets by mouth 3 (three) times daily as needed. Prn anxiety    . Multiple Vitamins-Minerals (MULTIVITAMIN WITH MINERALS) tablet Take 1 tablet by mouth daily.      . Omega-3 Fatty Acids (FISH OIL PO) Take 1,400 mg by mouth daily.    . pregabalin (LYRICA) 75 MG capsule Take 1 capsule (75 mg total) by mouth 2 (two) times daily. 60 capsule 5  . tiZANidine (ZANAFLEX) 2 MG tablet TAKE 1-2 TABLETS (2-4 MG TOTAL) BY MOUTH EVERY 6 (SIX) HOURS AS NEEDED (MUSCLE SPASM). 30 tablet 0  . zolmitriptan (ZOMIG) 5 MG nasal solution 1 spray in one nostril at earliest onset of headache.  May repeat once in 2 hours if needed. 6 Units 2  . zonisamide (ZONEGRAN) 25 MG capsule Take 3 capsules (75 mg total) by mouth daily. Take 75 mg at HS 90 capsule 2   No current facility-administered medications on file prior to visit.    ALLERGIES: Allergies  Allergen Reactions  . Penicillins Shortness Of Breath and Rash  . Codeine Other (See Comments)    Manic depressive  . Voltaren [Diclofenac] Nausea Only and Other (See Comments)    Pt stated "tore stomach up"  . Erythromycin Rash    FAMILY HISTORY: Family History  Problem Relation Age of Onset  . Hypertension Mother   . Depression Mother   . Migraines Mother   . Hypothyroidism  Mother   . COPD Father   . Heart failure Father   . Breast cancer Cousin     40's  . Cancer Cousin     breast; in her 62's  . Diabetes Neg Hx     SOCIAL HISTORY: Social History   Social History  . Marital Status: Married    Spouse Name: N/A  . Number of Children: N/A  . Years of Education: N/A   Occupational History  . Not on file.   Social History Main Topics  . Smoking status: Former Smoker    Quit date: 07/04/1987  . Smokeless tobacco: Never Used  . Alcohol Use: 0.0 oz/week    0 Standard drinks or equivalent per week     Comment: ONCE A MONTH  . Drug Use: No  . Sexual Activity:    Partners:  Male    Birth Control/ Protection: Other-see comments     Comment: husband had vasectomy   Other Topics Concern  . Not on file   Social History Narrative   Married.  Lives with husband, 1 daughter, brother-in-law (with Down's syndrome), 2 dogs.   2 other children live locally. Disabled due to anxiety.  Previously worked as Public relations account executive.    REVIEW OF SYSTEMS: Constitutional: No fevers, chills, or sweats, no generalized fatigue, change in appetite Eyes: No visual changes, double vision, eye pain Ear, nose and throat: No hearing loss, ear pain, nasal congestion, sore throat Cardiovascular: No chest pain, palpitations Respiratory:  No shortness of breath at rest or with exertion, wheezes GastrointestinaI: No nausea, vomiting, diarrhea, abdominal pain, fecal incontinence Genitourinary:  No dysuria, urinary retention or frequency Musculoskeletal:  Neck pain, back pain Integumentary: No rash, pruritus, skin lesions Neurological: as above Psychiatric: depression, insomnia, anxiety Endocrine: No palpitations, fatigue, diaphoresis, mood swings, change in appetite, change in weight, increased thirst Hematologic/Lymphatic:  No anemia, purpura, petechiae. Allergic/Immunologic: no itchy/runny eyes, nasal congestion, recent allergic reactions, rashes  PHYSICAL EXAM: Filed  Vitals:   03/02/15 1404  BP: 116/60  Pulse: 76   General: No acute distress.  Patient appears well-groomed.  Tearful. Head:  Normocephalic/atraumatic Eyes:  Fundoscopic exam unremarkable without vessel changes, exudates, hemorrhages or papilledema. Neck: supple, no paraspinal tenderness, full range of motion Heart:  Regular rate and rhythm Lungs:  Clear to auscultation bilaterally Back: No paraspinal tenderness Neurological Exam: alert and oriented to person, place, and time. Attention span and concentration intact, recent and remote memory intact, fund of knowledge intact.  Speech fluent and not dysarthric, language intact.  CN II-XII intact. Fundoscopic exam unremarkable without vessel changes, exudates, hemorrhages or papilledema.  Bulk and tone normal, muscle strength 5/5 throughout (some decreased effort with triceps bilaterally).  Reduced pinprick sensation of the 4th and 5th digits of right hand, ulnar aspect of right hand, and forearm.  Also reduced pinprick sensation involving the dorsum and bottom of right foot and lateral lower leg.  Vibration intact.  Deep tendon reflexes 2+ throughout, toes downgoing.  Finger to nose and heel to shin testing intact.  Gait normal  IMPRESSION: Chronic migraine without aura Cervical spine disease  PLAN: 1.  Will have her taper off of zonisamide and instead start propranolol 96m twice daily.  She will [place] with update 2.  We will have her try the Onzetra Xsail powder spray for abortive therapy.  Samples provided. 3.  Follow up in 2 to 3 months  We discussed Botox, but she would prefer to avoid needles.  AMetta Clines DO  CC:  ERita Ohara MD

## 2015-03-02 NOTE — Patient Instructions (Addendum)
Take propranolol 40mg  twice daily.  Call in 4 weeks with update Decrease zonisamide to 2 tablets daily for 5 days, then 1 tablet daily for 5 days, then stop For acute headache, take Onzetra nasal powder as prescribed.  Spray 1 in each nostril.  May repeat once in 2 hours if needed.  Follow up in 2 months.

## 2015-03-02 NOTE — Telephone Encounter (Signed)
Forms were faxed to Pt Assistance

## 2015-03-04 ENCOUNTER — Telehealth: Payer: Self-pay | Admitting: Hematology

## 2015-03-04 NOTE — Telephone Encounter (Signed)
NEW PATIENT APPT-S/W PATIENT AND GAVE NP APPT FOR 09/09 W/ARRIVAL OF 11:15 W/DR. KALE.  REFERRING DR. Estanislado Pandy  DX-MONOCLONAL PROTEIN  REFERRAL INFORMATION SCANNED INTO EPIC FOR REVIEW

## 2015-03-12 ENCOUNTER — Ambulatory Visit: Payer: Medicare Other

## 2015-03-12 ENCOUNTER — Encounter: Payer: Self-pay | Admitting: Neurology

## 2015-03-12 ENCOUNTER — Other Ambulatory Visit (HOSPITAL_BASED_OUTPATIENT_CLINIC_OR_DEPARTMENT_OTHER): Payer: Medicare Other

## 2015-03-12 ENCOUNTER — Encounter: Payer: Self-pay | Admitting: Hematology

## 2015-03-12 ENCOUNTER — Ambulatory Visit (HOSPITAL_BASED_OUTPATIENT_CLINIC_OR_DEPARTMENT_OTHER): Payer: Medicare Other | Admitting: Hematology

## 2015-03-12 ENCOUNTER — Telehealth: Payer: Self-pay | Admitting: Hematology

## 2015-03-12 VITALS — BP 108/65 | HR 68 | Temp 98.2°F | Resp 18 | Ht 66.0 in | Wt 190.2 lb

## 2015-03-12 DIAGNOSIS — D472 Monoclonal gammopathy: Secondary | ICD-10-CM

## 2015-03-12 LAB — LACTATE DEHYDROGENASE (CC13): LDH: 171 U/L (ref 125–245)

## 2015-03-12 LAB — CBC & DIFF AND RETIC
BASO%: 0.6 % (ref 0.0–2.0)
Basophils Absolute: 0 10*3/uL (ref 0.0–0.1)
EOS%: 0.7 % (ref 0.0–7.0)
Eosinophils Absolute: 0.1 10*3/uL (ref 0.0–0.5)
HCT: 40.4 % (ref 34.8–46.6)
HGB: 13.8 g/dL (ref 11.6–15.9)
Immature Retic Fract: 6.5 % (ref 1.60–10.00)
LYMPH%: 37.4 % (ref 14.0–49.7)
MCH: 33.7 pg (ref 25.1–34.0)
MCHC: 34.2 g/dL (ref 31.5–36.0)
MCV: 98.5 fL (ref 79.5–101.0)
MONO#: 0.5 10*3/uL (ref 0.1–0.9)
MONO%: 6.7 % (ref 0.0–14.0)
NEUT#: 3.7 10*3/uL (ref 1.5–6.5)
NEUT%: 54.6 % (ref 38.4–76.8)
Platelets: 339 10*3/uL (ref 145–400)
RBC: 4.1 10*6/uL (ref 3.70–5.45)
RDW: 12.8 % (ref 11.2–14.5)
Retic %: 1.33 % (ref 0.70–2.10)
Retic Ct Abs: 54.53 10*3/uL (ref 33.70–90.70)
WBC: 6.8 10*3/uL (ref 3.9–10.3)
lymph#: 2.6 10*3/uL (ref 0.9–3.3)
nRBC: 0 % (ref 0–0)

## 2015-03-12 LAB — COMPREHENSIVE METABOLIC PANEL (CC13)
ALT: 17 U/L (ref 0–55)
AST: 18 U/L (ref 5–34)
Albumin: 4.5 g/dL (ref 3.5–5.0)
Alkaline Phosphatase: 37 U/L — ABNORMAL LOW (ref 40–150)
Anion Gap: 7 mEq/L (ref 3–11)
BUN: 13.5 mg/dL (ref 7.0–26.0)
CO2: 25 mEq/L (ref 22–29)
Calcium: 9.8 mg/dL (ref 8.4–10.4)
Chloride: 108 mEq/L (ref 98–109)
Creatinine: 1 mg/dL (ref 0.6–1.1)
EGFR: 67 mL/min/{1.73_m2} — ABNORMAL LOW (ref >90)
Glucose: 85 mg/dl (ref 70–140)
Potassium: 4.1 mEq/L (ref 3.5–5.1)
Sodium: 140 mEq/L (ref 136–145)
Total Bilirubin: 0.41 mg/dL (ref 0.20–1.20)
Total Protein: 7.9 g/dL (ref 6.4–8.3)

## 2015-03-12 LAB — CHCC SMEAR

## 2015-03-12 NOTE — Telephone Encounter (Signed)
Gave and pritned appt sched and avs for pt for NOV °

## 2015-03-12 NOTE — Progress Notes (Signed)
Marland Kitchen    HEMATOLOGY/ONCOLOGY CONSULTATION NOTE  Date of Service: 03/12/2015  Patient Care Team: Rita Ohara, MD as PCP - General (Family Medicine)  CHIEF COMPLAINTS/PURPOSE OF CONSULTATION:  MGUS  HISTORY OF PRESENTING ILLNESS:   Lindsey Rodriguez is a wonderful 49 y.o. female who has been referred to [place] by Dr .[name], MD and Dr Estanislado Pandy for evaluation and management of monoclonal gammopathy of undetermined significance.  Patient has a history of hypertension, significant anxiety, significant problems with degenerative disc disease throughout her spine status post cervical discectomy and fusion as well as lumbar discectomy and fusion, plantar fasciitis and tendinitis who has been following with Dr. Estanislado Pandy from rheumatology for evaluation of her current back pain issues.  She was noted to be HLA-B27 positive but x-rays did not show overt ankylosing spondylitis.  She notes that she was also recently diagnosed with fibromyalgia.  As a part of her workup she had an SPEP    Which showed an M protein spike of 1 g/dLwith IFE was noted to be an IgG lambda protein.  Quantitative immunoglobulins were within normal limits and IgG 1410, IgA of 103 and IgM of 56. Her sedimentation rate was 7.  Rheumatoid factor, CCP antibody negative.  She was referred to [place] to rule out a plasma cell dyscrasia. Patient notes no focal bone pains though she has a lot of chronic arthralgias and myalgias. Labs have not revealed any anemia, renal failure or hypercalcemia. She has had no unexpected weight loss, night sweats, fevers or chills.  MEDICAL HISTORY:  Past Medical History  Diagnosis Date  . Anxiety panic attacks    [name]  . Hypertension   . Kidney stones h/o  . Chronic neck pain   . Chronic back pain   . Migraine   . Plantar fasciitis right  . Tendonitis of foot right    SURGICAL HISTORY: Past Surgical History  Procedure Laterality Date  . Neck surgery  [name]  Summer 2011    X4  . Back  surgery  Dr.Stern, last sx Summer 2011    X4  . Tonsillectomy    . Rhinoplasty      SOCIAL HISTORY: Social History   Social History  . Marital Status: Married    Spouse Name: N/A  . Number of Children: N/A  . Years of Education: N/A   Occupational History  . Not on file.   Social History Main Topics  . Smoking status: Former Smoker    Quit date: 07/04/1987  . Smokeless tobacco: Never Used  . Alcohol Use: 0.0 oz/week    0 Standard drinks or equivalent per week     Comment: ONCE A MONTH  . Drug Use: No  . Sexual Activity:    Partners: Male    Birth Control/ Protection: Other-see comments     Comment: husband had vasectomy   Other Topics Concern  . Not on file   Social History Narrative   Married.  Lives with husband, 1 daughter, brother-in-law (with Down's syndrome), 2 dogs.   2 other children live locally. Disabled due to anxiety.  Previously worked as Public relations account executive.    FAMILY HISTORY: Family History  Problem Relation Age of Onset  . Hypertension Mother   . Depression Mother   . Migraines Mother   . Hypothyroidism Mother   . COPD Father   . Heart failure Father   . Breast cancer Cousin     40's  . Cancer Cousin     breast;  in her 40's  . Diabetes Neg Hx     ALLERGIES:  is allergic to penicillins; codeine; voltaren; and erythromycin.  MEDICATIONS:  Current Outpatient Prescriptions  Medication Sig Dispense Refill  . amLODipine-benazepril (LOTREL) 5-10 MG per capsule Take 1 capsule by mouth daily. 30 capsule 5  . Ascorbic Acid (VITAMIN C) 1000 MG tablet Take 1,000 mg by mouth daily.    . B Complex Vitamins (B-COMPLEX/B-12 PO) Take by mouth daily.    Marland Kitchen buPROPion (WELLBUTRIN XL) 150 MG 24 hr tablet     . Calcium Carb-Cholecalciferol (CALCIUM PLUS VITAMIN D3) 600-800 MG-UNIT TABS Take 1 tablet by mouth daily.    . calcium-vitamin D (OSCAL WITH D) 250-125 MG-UNIT per tablet Take 1 tablet by mouth daily.    . fenofibrate 160 MG tablet TAKE 1 TABLET (160  MG TOTAL) BY MOUTH DAILY. 90 tablet 1  . FLUoxetine (PROZAC) 40 MG capsule Take 40 mg by mouth daily.     Marland Kitchen FOLIC ACID PO Take 1 tablet by mouth daily.    Marland Kitchen gabapentin (NEURONTIN) 800 MG tablet TAKE 1 TABLET (800 MG TOTAL) BY MOUTH AT BEDTIME. 30 tablet 5  . JINTELI 1-5 MG-MCG TABS Take 1 tablet by mouth daily.    Marland Kitchen loratadine (CLARITIN) 10 MG tablet Take 10 mg by mouth daily.      Marland Kitchen LORazepam (ATIVAN) 0.5 MG tablet Take 1-2 tablets by mouth 3 (three) times daily as needed. Prn anxiety    . magnesium 30 MG tablet Take 30 mg by mouth 2 (two) times daily.    . Multiple Vitamins-Minerals (MULTIVITAMIN WITH MINERALS) tablet Take 1 tablet by mouth daily.      . Omega-3 Fatty Acids (FISH OIL PO) Take 1,400 mg by mouth daily.    . pregabalin (LYRICA) 75 MG capsule Take 1 capsule (75 mg total) by mouth 2 (two) times daily. 60 capsule 5  . propranolol (INDERAL) 40 MG tablet Take 1 tablet (40 mg total) by mouth 2 (two) times daily. 60 tablet 3  . tiZANidine (ZANAFLEX) 2 MG tablet TAKE 1-2 TABLETS (2-4 MG TOTAL) BY MOUTH EVERY 6 (SIX) HOURS AS NEEDED (MUSCLE SPASM). 30 tablet 0  . divalproex (DEPAKOTE ER) 250 MG 24 hr tablet Take 1 tablet (250 mg total) by mouth daily. 30 tablet 1   No current facility-administered medications for this visit.    REVIEW OF SYSTEMS:    10 Point review of Systems was done is negative except as noted above.  PHYSICAL EXAMINATION: ECOG PERFORMANCE STATUS: 1 - Symptomatic but completely ambulatory  . Filed Vitals:   03/12/15 1116  Height: 5' 6"  (1.676 m)  Weight: 190 lb 3.2 oz (86.274 kg)   Filed Weights   03/12/15 1116  Weight: 190 lb 3.2 oz (86.274 kg)   .Body mass index is 30.71 kg/(m^2).  GENERAL:alert, in no acute distress and comfortable SKIN: skin color, texture, turgor are normal, no rashes or significant lesions EYES: normal, conjunctiva are pink and non-injected, sclera clear OROPHARYNX:no exudate, no erythema and lips, buccal mucosa, and tongue  normal  NECK: supple, no JVD, thyroid normal size, non-tender, without nodularity LYMPH:  no palpable lymphadenopathy in the cervical, axillary or inguinal LUNGS: clear to auscultation with normal respiratory effort HEART: regular rate & rhythm,  no murmurs and no lower extremity edema ABDOMEN: abdomen soft, non-tender, normoactive bowel sounds  Musculoskeletal: no cyanosis of digits and no clubbing  PSYCH: alert & oriented x 3 with fluent speech NEURO: no focal motor/sensory deficits  LABORATORY DATA:  I have reviewed the data as listed  . CBC Latest Ref Rng 03/12/2015 10/29/2014 02/19/2014  WBC 3.9 - 10.3 10e3/uL 6.8 5.8 6.4  Hemoglobin 11.6 - 15.9 g/dL 13.8 12.9 13.6  Hematocrit 34.8 - 46.6 % 40.4 38.8 39.5  Platelets 145 - 400 10e3/uL 339 369 399    . CMP Latest Ref Rng 03/12/2015 10/29/2014 06/01/2014  Glucose 70 - 140 mg/dl 85 77 81  BUN 7.0 - 26.0 mg/dL 13.5 16 13   Creatinine 0.6 - 1.1 mg/dL 1.0 0.88 0.82  Sodium 136 - 145 mEq/L 140 138 136  Potassium 3.5 - 5.1 mEq/L 4.1 4.5 4.7  Chloride 96 - 112 mEq/L - 106 104  CO2 22 - 29 mEq/L 25 22 25   Calcium 8.4 - 10.4 mg/dL 9.8 9.8 9.7  Total Protein 6.4 - 8.3 g/dL 7.9 8.0 7.6  Total Bilirubin 0.20 - 1.20 mg/dL 0.41 0.4 0.4  Alkaline Phos 40 - 150 U/L 37(L) 32(L) 38(L)  AST 5 - 34 U/L 18 16 19   ALT 0 - 55 U/L 17 17 21    . Lab Results  Component Value Date   TOTALPROTELP 7.5 03/12/2015   ALBUMINELP 4.7 03/12/2015   A1GS 0.2 03/12/2015   A2GS 0.6 03/12/2015   BETS 0.5 03/12/2015   BETA2SER 0.3 03/12/2015   GAMS 1.3 03/12/2015   SPEI * 03/12/2015   (this displays SPEP labs)  Lab Results  Component Value Date   KPAFRELGTCHN 0.83 03/12/2015   LAMBDASER 1.47 03/12/2015   KAPLAMBRATIO 0.56 03/12/2015   (kappa/lambda light chains)  . Lab Results  Component Value Date   LDH 171 03/12/2015   beta 2 microglobulin: 1.87   RADIOGRAPHIC STUDIES: I have personally reviewed the radiological images as listed and agreed with  the findings in the report. No results found.   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 M spike of 1 g/dL.,  Kappa/lambda free light chain ratios noted, beta 2 microglobulin within normal limits Normal sedimentation rate and LDH. No evidence of anemia, renal failure hypercalcemia on labs. Plan -We will get a whole-body skeletal survey to rule out overt bone lesions. -24-hour urine protein electrophoresis -We'll hold off on a bone marrow examination at this time. -if UPEP and skeletal survey are negative we will plan to repeat serological markers in 3 months with a clinic visit for monitoring. -if her M spike increases significantly she might need a bone marrow examination.  Other things to watch for are newly developing anemia, renal failure or hypercalcemia.  Continue followup with primary care physician and rheumatology.  All of the patients questions were answered to her and her husbands apparent satisfaction. The patient knows to call the clinic with any problems, questions or concerns.  I spent 40 minutes counseling the patient face to face. The total time spent in the appointment was 60 minutes and more than 50% was on counseling and direct patient cares.    Sullivan Lone MD [place] AAHIVMS [facility] Hematology/Oncology Physician [place]   (Office):       [phone] (Work cell):  [phone] (Fax):           [phone]  03/12/2015 11:39 AM

## 2015-03-16 ENCOUNTER — Other Ambulatory Visit: Payer: Self-pay | Admitting: Neurology

## 2015-03-16 DIAGNOSIS — G43719 Chronic migraine without aura, intractable, without status migrainosus: Secondary | ICD-10-CM

## 2015-03-16 LAB — KAPPA/LAMBDA LIGHT CHAINS
Kappa free light chain: 0.83 mg/dL (ref 0.33–1.94)
Kappa:Lambda Ratio: 0.56 (ref 0.26–1.65)
Lambda Free Lght Chn: 1.47 mg/dL (ref 0.57–2.63)

## 2015-03-16 LAB — SEDIMENTATION RATE: Sed Rate: 4 mm/hr (ref 0–20)

## 2015-03-16 LAB — SPEP & IFE WITH QIG
Abnormal Protein Band1: 0.9 g/dL
Albumin ELP: 4.7 g/dL (ref 3.8–4.8)
Alpha-1-Globulin: 0.2 g/dL (ref 0.2–0.3)
Alpha-2-Globulin: 0.6 g/dL (ref 0.5–0.9)
Beta 2: 0.3 g/dL (ref 0.2–0.5)
Beta Globulin: 0.5 g/dL (ref 0.4–0.6)
Gamma Globulin: 1.3 g/dL (ref 0.8–1.7)
IgA: 81 mg/dL (ref 69–380)
IgG (Immunoglobin G), Serum: 1470 mg/dL (ref 690–1700)
IgM, Serum: 49 mg/dL — ABNORMAL LOW (ref 52–322)
Total Protein, Serum Electrophoresis: 7.5 g/dL (ref 6.1–8.1)

## 2015-03-16 LAB — BETA 2 MICROGLOBULIN, SERUM: Beta-2 Microglobulin: 1.87 mg/L (ref <=2.51)

## 2015-03-16 MED ORDER — DIVALPROEX SODIUM ER 250 MG PO TB24: 250.0000 mg | ORAL_TABLET | Freq: Every day | ORAL | Status: DC

## 2015-03-17 ENCOUNTER — Encounter: Payer: Self-pay | Admitting: Hematology

## 2015-03-17 NOTE — Progress Notes (Signed)
Contacted pt to introduce myself. Asked pt if she had any financial questions/concerns at this time. Pt states no. Advised pt of how to contact me if needed.

## 2015-03-19 ENCOUNTER — Ambulatory Visit (HOSPITAL_COMMUNITY)
Admission: RE | Admit: 2015-03-19 | Discharge: 2015-03-19 | Disposition: A | Discharge status: Home / Self Care | Payer: Medicare Other | Source: Ambulatory Visit | Attending: Hematology | Admitting: Hematology

## 2015-03-19 ENCOUNTER — Telehealth: Payer: Self-pay

## 2015-03-19 DIAGNOSIS — G629 Polyneuropathy, unspecified: Secondary | ICD-10-CM

## 2015-03-19 DIAGNOSIS — D472 Monoclonal gammopathy: Secondary | ICD-10-CM

## 2015-03-19 MED ORDER — PREGABALIN 75 MG PO CAPS: 75.0000 mg | ORAL_CAPSULE | Freq: Two times a day (BID) | ORAL | Status: DC

## 2015-03-19 NOTE — Telephone Encounter (Signed)
[employer] Patient Assistance Program called concerning this patient's prescription for Lyrica 75mg  #60. They said the one they got was signed electronically and they need a wet signature. This can be faxed to them at [phone] and the fax cover has to have the pt name, DOB, and address on it.

## 2015-03-19 NOTE — Telephone Encounter (Signed)
Last was rx'd for #60 with 5 refills.  Please print out a prescription for this and put on my desk to be signed and faxed on Monday.  Thanks

## 2015-03-21 ENCOUNTER — Other Ambulatory Visit: Payer: Self-pay | Admitting: Family Medicine

## 2015-03-21 DIAGNOSIS — R51 Headache: Principal | ICD-10-CM

## 2015-03-21 DIAGNOSIS — R519 Headache, unspecified: Secondary | ICD-10-CM

## 2015-03-22 LAB — UPEP/TP, 24-HR URINE
Collection Interval: 24 hours
Total Protein, Urine: <4 mg/dL
Total Volume, Urine: 4950 mL

## 2015-03-22 MED ORDER — TIZANIDINE HCL 2 MG PO TABS: ORAL_TABLET | ORAL | Status: DC

## 2015-03-22 NOTE — Addendum Note (Signed)
Addended by: Deforest Hoyles D on: 03/22/2015 10:16 AM   Modules accepted: Orders

## 2015-03-29 ENCOUNTER — Encounter: Payer: Self-pay | Admitting: Hematology

## 2015-04-02 NOTE — Progress Notes (Signed)
   Subjective:    Patient ID: Lindsey Rodriguez, female    DOB: 1965/12/13, 49 y.o.   MRN: [id number]  HPI    Review of Systems     Objective:   Physical Exam        Assessment & Plan:

## 2015-04-06 ENCOUNTER — Encounter: Payer: Self-pay | Admitting: Neurology

## 2015-04-06 ENCOUNTER — Other Ambulatory Visit: Payer: Self-pay

## 2015-04-06 DIAGNOSIS — Z1231 Encounter for screening mammogram for malignant neoplasm of breast: Secondary | ICD-10-CM

## 2015-04-07 ENCOUNTER — Other Ambulatory Visit (INDEPENDENT_AMBULATORY_CARE_PROVIDER_SITE_OTHER): Payer: Medicare Other

## 2015-04-07 ENCOUNTER — Other Ambulatory Visit: Payer: Self-pay | Admitting: Neurology

## 2015-04-07 DIAGNOSIS — Z23 Encounter for immunization: Secondary | ICD-10-CM

## 2015-04-07 MED ORDER — DIVALPROEX SODIUM ER 250 MG PO TB24: 250.0000 mg | ORAL_TABLET | Freq: Every day | ORAL | Status: DC

## 2015-04-13 NOTE — Telephone Encounter (Signed)
.   Lab Results  Component Value Date   TOTALPROTELP 7.5 03/12/2015   ALBUMINELP 4.7 03/12/2015   A1GS 0.2 03/12/2015   A2GS 0.6 03/12/2015   BETS 0.5 03/12/2015   BETA2SER 0.3 03/12/2015   GAMS 1.3 03/12/2015   SPEI * 03/12/2015   Comments: A restricted band consistent with monoclonal protein is present.The monoclonal protein peak accounts for 0.9 g/dL of the total1.3 g/dL of protein in the gamma region. Results are consistent with SPE performed on 02/08/2015 Reviewed by Odis Hollingshead,  MD, PhD, FCAP   Comments: Monoclonal IgG lambda protein is present.Reviewed by Odis Hollingshead, MD, PhD, FCAP    Lab Results  Component Value Date   KPAFRELGTCHN 0.83 03/12/2015   LAMBDASER 1.47 03/12/2015   KAPLAMBRATIO 0.56 03/12/2015   (kappa/lambda light chains)  Beta-2 Microglobulin <=2.51 mg/L 1.87       . Lab Results  Component Value Date   LDH 171 03/12/2015      EXAM: METASTATIC BONE SURVEY  COMPARISON: 02/19/2014  FINDINGS: Stable mid to lower cervical fusion noted. Fixation screws at C4 are broken as previously demonstrated. Stable alignment and fusion. Very minimal scoliosis of the thoracic spine. Minor endplate degenerative changes of the thoracic spine.  Lower lumbar fusion at L4-5 and L5-S1. Degenerative changes at L3-4 with disc space loss, sclerosis and bony spurring. Disc spacers at L4-5 and L5-S1. Stable alignment. Preserved vertebral body heights.  Visualized lungs are clear. Normal heart size and vascularity. No effusion or pneumothorax. Trachea midline.  No visualized lytic, sclerotic or destructive bone lesions of the visualized axial and appendicular skeleton.  IMPRESSION: Chronic and postoperative findings as above.  No visualized osseous lytic or destructive process demonstrated by skeletal survey.   Called and discussed all about results with Mrs. Charo on her home phone number. All her questions were answered in detail. No  overt markers of multiple myeloma at this time. Plan -Continue follow-up as per appointment on 06/04/2015 to monitor myeloma markers and watch for progression. -No indications for treatment at this time. Continue follow-up with primary care physician.  Sullivan Lone MD MS Hematology/Oncology Physician [facility]

## 2015-05-04 HISTORY — PX: MELANOMA EXCISION: SHX5266

## 2015-05-05 ENCOUNTER — Encounter: Payer: Self-pay | Admitting: Neurology

## 2015-05-05 ENCOUNTER — Ambulatory Visit (INDEPENDENT_AMBULATORY_CARE_PROVIDER_SITE_OTHER): Payer: Medicare Other | Admitting: Neurology

## 2015-05-05 VITALS — BP 122/74 | HR 95 | Ht 66.75 in | Wt 181.0 lb

## 2015-05-05 DIAGNOSIS — M5090 Cervical disc disorder, unspecified, unspecified cervical region: Secondary | ICD-10-CM

## 2015-05-05 DIAGNOSIS — G43709 Chronic migraine without aura, not intractable, without status migrainosus: Secondary | ICD-10-CM | POA: Diagnosis not present

## 2015-05-05 DIAGNOSIS — Z79899 Other long term (current) drug therapy: Secondary | ICD-10-CM

## 2015-05-05 MED ORDER — DIVALPROEX SODIUM ER 500 MG PO TB24: 500.0000 mg | ORAL_TABLET | Freq: Every day | ORAL | Status: DC

## 2015-05-05 NOTE — Progress Notes (Signed)
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  She is accompanied by her husband who provides some history.  Labs reviewed.  UPDATE: She has noted some improvement in intensity and frequency of migraines since starting Depakote.  She still has daily tension-type headaches. Intensity:  No more than 5-6/10 Duration:  A couple of hours Frequency:  2 days a week Current abortive medication:  Uses ice packs.  Has not tried Debara Pickett powder spray yet, because she feels they are not severe enough Antihypertensive medications:  amlodipine Antidepressant medications:  wellbutrin, fluoxetine Anticonvulsant medications:  Depakote ER 250mg , Lyrica 75mg  twice daily Vitamins/Herbal/Supplements:  Magnesium maleate 1250mg  twice daily She takes tizanidine sometimes for neck tightness  Labs from September:  CBC and LFTs normal.  Caffeine:  no Alcohol:  rarely Smoker:  no Diet:  healthy Exercise:  Not lately due to plantar fasciitis Depression/stress:  Stress (on disability due to neuropathy from back surgery and panic attacks which are now better controlled).  Also stress related to home as she lives with her ailing father in law in hospice and brother in law with Down Syndrome. Sleep hygiene:  Poor.  Able to fall asleep but wakes up easily.  Arm numbness is improved.  HISTORY: Onset:  Childhood Location:  Varies (back of head, crown, bi-temporal, retro-orbital, band-like) Quality:  Retro-orbital throbbing, band-like vice, stabbing at back of head Initial Intensity:  4/10 constant, 8/10 when severe; August 6/10 Aura:  no Prodrome:  no Associated symptoms:  Nausea, photophobia, phonophobia, tunnel vision Initial Duration:   Constant but severe episodes 8 hours (with sumatriptan and naproxen); Initial Frequency:  Daily but severe episodes occur once a week; August severe 3 to 4 days per week Triggers/exacerbating factors:  stress Relieving factors:  Ice pack Activity:  Cannot function at least once a week.  Past abortive therapy:  Relpax 40mg , Treximet, Maxalt, Zomig po, Tramadol, sumatriptan 100mg  w/wo naproxen, sumatriptan NS, Zomig NS Past preventative therapy:  Cymbalta, Topamax, nortriptyline, sertraline, Effexor, zonisamide, propranolol (hypotension), biofeedback.    To further evaluate numbness and weakness in right arm, he had an MRI of the cervical spine performed on 11/26/14, which showed post surgical changes from C4-T2 fusion.  There was also left disc protrusion at C3-4 where it deforms the left hemi-cord and deflects the cord to the right.  She had a NCV-EMG performed on 12/08/14, which was normal.  A NCV-EMG was performed on 12/08/14, which was normal.   Her wrist still hurts.  She is seeing an orthopedist.  Family history of headache:  mother  PAST MEDICAL HISTORY: Past Medical History  Diagnosis Date  . Anxiety panic attacks    [name]  . Hypertension   . Kidney stones h/o  . Chronic neck pain   . Chronic back pain   . Migraine   . Plantar fasciitis right  . Tendonitis of foot right    MEDICATIONS: Current Outpatient Prescriptions on File Prior to Visit  Medication Sig Dispense Refill  . amLODipine-benazepril (LOTREL) 5-10 MG per capsule Take 1 capsule by mouth daily. 30 capsule 5  . Ascorbic Acid (VITAMIN C) 1000 MG tablet Take 1,000 mg by mouth daily.    . B Complex Vitamins (B-COMPLEX/B-12 PO) Take by mouth daily.    Marland Kitchen  buPROPion (WELLBUTRIN XL) 150 MG 24 hr tablet     . Calcium Carb-Cholecalciferol (CALCIUM PLUS VITAMIN D3) 600-800 MG-UNIT TABS Take 1 tablet by mouth daily.    . calcium-vitamin D (OSCAL WITH D) 250-125 MG-UNIT per tablet Take 1 tablet by mouth daily.    . fenofibrate  160 MG tablet TAKE 1 TABLET (160 MG TOTAL) BY MOUTH DAILY. 90 tablet 1  . FLUoxetine (PROZAC) 40 MG capsule Take 40 mg by mouth daily.     Marland Kitchen FOLIC ACID PO Take 1 tablet by mouth daily.    Marland Kitchen JINTELI 1-5 MG-MCG TABS Take 1 tablet by mouth daily.    Marland Kitchen loratadine (CLARITIN) 10 MG tablet Take 10 mg by mouth daily.      Marland Kitchen LORazepam (ATIVAN) 0.5 MG tablet Take 1-2 tablets by mouth 3 (three) times daily as needed. Prn anxiety    . magnesium 30 MG tablet Take 30 mg by mouth 2 (two) times daily.    . Multiple Vitamins-Minerals (MULTIVITAMIN WITH MINERALS) tablet Take 1 tablet by mouth daily.      . Omega-3 Fatty Acids (FISH OIL PO) Take 1,400 mg by mouth daily.    . pregabalin (LYRICA) 75 MG capsule Take 1 capsule (75 mg total) by mouth 2 (two) times daily. 60 capsule 5  . tiZANidine (ZANAFLEX) 2 MG tablet TAKE 1-2 TABLETS (2-4 MG TOTAL) BY MOUTH EVERY 6 (SIX) HOURS AS NEEDED (MUSCLE SPASM). 30 tablet 0  . propranolol (INDERAL) 40 MG tablet Take 1 tablet (40 mg total) by mouth 2 (two) times daily. (Patient not taking: Reported on 05/05/2015) 60 tablet 3   No current facility-administered medications on file prior to visit.    ALLERGIES: Allergies  Allergen Reactions  . Penicillins Shortness Of Breath and Rash  . Codeine Other (See Comments)    Manic depressive  . Voltaren [Diclofenac] Nausea Only and Other (See Comments)    Pt stated "tore stomach up"  . Erythromycin Rash    FAMILY HISTORY: Family History  Problem Relation Age of Onset  . Hypertension Mother   . Depression Mother   . Migraines Mother   . Hypothyroidism Mother   . COPD Father   . Heart failure Father   . Breast cancer Cousin     40's  . Cancer Cousin     breast; in her 75's  . Diabetes Neg Hx     SOCIAL HISTORY: Social History   Social History  . Marital Status: Married    Spouse Name: N/A  . Number of Children: N/A  . Years of Education: N/A   Occupational History  . Not on file.   Social History Main  Topics  . Smoking status: Former Smoker    Quit date: 07/04/1987  . Smokeless tobacco: Never Used  . Alcohol Use: 0.0 oz/week    0 Standard drinks or equivalent per week     Comment: ONCE A MONTH  . Drug Use: No  . Sexual Activity:    Partners: Male    Birth Control/ Protection: Other-see comments     Comment: husband had vasectomy   Other Topics Concern  . Not on file   Social History Narrative   Married.  Lives with husband, 1 daughter, brother-in-law (with Down's syndrome), 2 dogs.   2 other children live locally. Disabled due to anxiety.  Previously worked as Public relations account executive.    REVIEW OF SYSTEMS: Constitutional: No fevers, chills, or sweats, no generalized fatigue, change in appetite Eyes: No visual  changes, double vision, eye pain Ear, nose and throat: No hearing loss, ear pain, nasal congestion, sore throat Cardiovascular: No chest pain, palpitations Respiratory:  No shortness of breath at rest or with exertion, wheezes GastrointestinaI: No nausea, vomiting, diarrhea, abdominal pain, fecal incontinence Genitourinary:  No dysuria, urinary retention or frequency Musculoskeletal:  Neck pain Integumentary: No rash, pruritus, skin lesions Neurological: as above Psychiatric: No depression, insomnia, anxiety Endocrine: No palpitations, fatigue, diaphoresis, mood swings, change in appetite, change in weight, increased thirst Hematologic/Lymphatic:  No anemia, purpura, petechiae. Allergic/Immunologic: no itchy/runny eyes, nasal congestion, recent allergic reactions, rashes  PHYSICAL EXAM: Filed Vitals:   05/05/15 1044  BP: 122/74  Pulse: 95   General: No acute distress.  Patient appears well-groomed.   Head:  Normocephalic/atraumatic Eyes:  Fundoscopic exam unremarkable without vessel changes, exudates, hemorrhages or papilledema. Neck: supple, no paraspinal tenderness, full range of motion Heart:  Regular rate and rhythm Lungs:  Clear to auscultation  bilaterally Back: No paraspinal tenderness Neurological Exam: alert and oriented to person, place, and time. Attention span and concentration intact, recent and remote memory intact, fund of knowledge intact.  Speech fluent and not dysarthric, language intact.  CN II-XII intact. Fundoscopic exam unremarkable without vessel changes, exudates, hemorrhages or papilledema.  Bulk and tone normal, muscle strength 5/5 throughout.  Sensation to light touch, temperature and vibration intact.  Deep tendon reflexes 2+ throughout.  Finger to nose and heel to shin testing intact.  Gait normal.  IMPRESSION: Chronic migraine without aura Cervical spine disease  PLAN: 1.  Increase Depakote ER to 500mg  daily 2.  Repeat CBC and LFTs in 3 months, one week prior to follow up  Metta Clines, DO  CC:  Rita Ohara, MD

## 2015-05-05 NOTE — Patient Instructions (Signed)
1.  Refilled prescription of depakote ER to 500mg  daily 2.  CBC and LFTs in 3 months (about 1 week prior to follow up)

## 2015-05-06 ENCOUNTER — Ambulatory Visit
Admission: RE | Admit: 2015-05-06 | Discharge: 2015-05-06 | Disposition: A | Discharge status: Home / Self Care | Payer: Medicare Other | Source: Ambulatory Visit

## 2015-05-06 DIAGNOSIS — Z1231 Encounter for screening mammogram for malignant neoplasm of breast: Secondary | ICD-10-CM

## 2015-05-20 ENCOUNTER — Ambulatory Visit (INDEPENDENT_AMBULATORY_CARE_PROVIDER_SITE_OTHER): Payer: Medicare Other | Admitting: Family Medicine

## 2015-05-20 ENCOUNTER — Encounter: Payer: Self-pay | Admitting: Family Medicine

## 2015-05-20 VITALS — BP 110/78 | HR 80 | Temp 98.6°F | Ht 66.25 in | Wt 189.4 lb

## 2015-05-20 DIAGNOSIS — J019 Acute sinusitis, unspecified: Secondary | ICD-10-CM | POA: Diagnosis not present

## 2015-05-20 MED ORDER — AZITHROMYCIN 250 MG PO TABS: ORAL_TABLET | ORAL | Status: DC

## 2015-05-20 NOTE — Progress Notes (Signed)
Chief Complaint  Patient presents with  . Cough    started last Thursday with sinus congestion. Has moved to her chest, coughing a lot and has lost her voice. She is exhausted and cannot keep her temps regulated-all over the place. No sore throat. Mucus is green and yellow in color.   She started a week ago with sinus congestion, postnasal drip, and has moved into her chest and she lost her voice.  She is coughing--productive of green phlegm. Phlegm has gotten progressively darker, thicker.  Sometimes her nasal drainage is clear, sometimes it is green.  She is having sinus pain around her eyes bilaterally--mostly in her cheeks, some discomfort above the eyes also.  She is using the Neti-pot twice daily with some temporary improvement in discomfort.  She has been using guaifenesin-DM, and has tried alternating this with tylenol cold or Dayquil (both of which have decongestant, tylenol and DM in it), taking one alternating with the other every 4 hours (not taking together).  She had some temporary help.  She stopped using the decongestants a couple of days ago, but has continued the guaifenesin-DM, as this helped the most. She used a phenylephrine nasal spray for just 2-3 days.  She hasn't checked her temperatures, but has been feeling really hot and really cold.  No sick contacts.  PMH, PSH, SH reviewed  Outpatient Encounter Prescriptions as of 05/20/2015  Medication Sig Note  . amLODipine-benazepril (LOTREL) 5-10 MG per capsule Take 1 capsule by mouth daily.   . Ascorbic Acid (VITAMIN C) 1000 MG tablet Take 1,000 mg by mouth daily.   . B Complex Vitamins (B-COMPLEX/B-12 PO) Take by mouth daily.   Marland Kitchen buPROPion (WELLBUTRIN XL) 150 MG 24 hr tablet  11/11/2014: Received from: External Pharmacy  . Calcium Carb-Cholecalciferol (CALCIUM PLUS VITAMIN D3) 600-800 MG-UNIT TABS Take 1 tablet by mouth daily.   Marland Kitchen Dextromethorphan-Guaifenesin (GNP MUCUS RELIEF DM) 20-400 MG TABS Take 1 tablet by mouth every 4  (four) hours.   . divalproex (DEPAKOTE ER) 500 MG 24 hr tablet Take 1 tablet (500 mg total) by mouth daily.   . fenofibrate 160 MG tablet TAKE 1 TABLET (160 MG TOTAL) BY MOUTH DAILY.   Marland Kitchen FLUoxetine (PROZAC) 40 MG capsule Take 40 mg by mouth daily.  11/11/2014: Received from: External Pharmacy  . JINTELI 1-5 MG-MCG TABS Take 1 tablet by mouth daily. 03/10/2013: Dr. Philis Pique  . loratadine (CLARITIN) 10 MG tablet Take 10 mg by mouth daily.   09/11/2013: Takes prn allergies  . LORazepam (ATIVAN) 0.5 MG tablet Take 1-2 tablets by mouth 3 (three) times daily as needed. Prn anxiety 03/10/2013: Using 1-2x/day, rx'd by [name]  . magnesium 30 MG tablet Take 30 mg by mouth 2 (two) times daily.   . Multiple Vitamins-Minerals (MULTIVITAMIN WITH MINERALS) tablet Take 1 tablet by mouth daily.     . Omega-3 Fatty Acids (FISH OIL PO) Take 1,400 mg by mouth daily.   . pregabalin (LYRICA) 75 MG capsule Take 1 capsule (75 mg total) by mouth 2 (two) times daily.   . [DISCONTINUED] gabapentin (NEURONTIN) 800 MG tablet  05/20/2015: Received from: External Pharmacy  . [DISCONTINUED] propranolol (INDERAL) 40 MG tablet Take 1 tablet (40 mg total) by mouth 2 (two) times daily.   . chlorpheniramine (CHLOR-TRIMETON) 4 MG tablet Take 4 mg by mouth 2 (two) times daily as needed for allergies. 05/20/2015: Tried for a little with this illness, not currently taking  . phenylephrine (EQL NASAL SPRAY FAST ACTING) 1 %  nasal spray Place 1 drop into both nostrils every 6 (six) hours as needed for congestion. 05/20/2015: Used for 2-3 days, then stopped  . tiZANidine (ZANAFLEX) 2 MG tablet TAKE 1-2 TABLETS (2-4 MG TOTAL) BY MOUTH EVERY 6 (SIX) HOURS AS NEEDED (MUSCLE SPASM). (Patient not taking: Reported on 05/20/2015)   . [DISCONTINUED] calcium-vitamin D (OSCAL WITH D) 250-125 MG-UNIT per tablet Take 1 tablet by mouth daily.   . [DISCONTINUED] divalproex (DEPAKOTE ER) 250 MG 24 hr tablet  05/20/2015: Received from: External Pharmacy  .  [DISCONTINUED] FOLIC ACID PO Take 1 tablet by mouth daily.   . [DISCONTINUED] Pseudoeph-CPM-DM-APAP (TYLENOL COLD PO) Take 2 tablets by mouth daily.   . [DISCONTINUED] Pseudoephedrine-APAP-DM (DAYQUIL PO) Take 2 tablets by mouth daily.    No facility-administered encounter medications on file as of 05/20/2015.   Allergies  Allergen Reactions  . Penicillins Shortness Of Breath and Rash  . Codeine Other (See Comments)    Manic depressive  . Voltaren [Diclofenac] Nausea Only and Other (See Comments)    Pt stated "tore stomach up"  . Erythromycin Rash  she has taken z-pak without problems in the past  ROS:  No vomiting or diarrhea. Some nausea. Has had some vertigo, no lightheadedness or syncope.  No bleeding, bruising, rashes.  Upper lip has been very dry, with some cracking. See HPI  PHYSICAL EXAM: BP 110/78 mmHg  Pulse 80  Temp(Src) 98.6 F (37 C) (Tympanic)  Ht 5' 6.25" (1.683 m)  Wt 189 lb 6.4 oz (85.911 kg)  BMI 30.33 kg/m2  Mildly ill appearing female, with hoarse voice, and intermittently coughing up thick, slightly yellow phlegm. HEENT: PERRL, EOMI, conjunctiva clear. TM's and EAC's normal. Nasal mucosa is moderately edematous, +erythema and yellow crusting, some yellow drainage noted.  Tender over sinuses x 4, maxillary>frontal. OP is clear without erythema Neck: no lymphadenopathy, thyromegaly or mass Heart: regular rate and rhythm without murmur Lungs: clear bilaterally, no wheezes, rales, or ronchi. No coughing with forced expiration.  ASSESSMENT/PLAN:  Acute sinusitis, recurrence not specified, unspecified location - Plan: azithromycin (ZITHROMAX) 250 MG tablet   Drink plenty of fluid Continue the guaifenesin (along with DM if still coughing a lot) Add decongestant such as sudafed, to help as needed for sinus pain.  Your blood pressure is very well controlled and should tolerate this without raising it too much (feel free to periodically monitor your blood pressure,  and if >140/90 then stop decongestant).  Start the z-pak today.  Call next week before the holiday if you feel like you are getting worse, or zero improvement.  We would then change your antibiotic (ie Avelox). Otherwise, continue the full z-pak. If by day 10 (today is day 1, the day you are starting the antibiotic) you are not 100% better, then fill the refill and start the second z-pak on day#11.  Do not use the tylenol cold or Dayquil along with the Mucinex, as these both have DM. Instead use a separate decongestant (pseudoephedrine or phenylephrine).

## 2015-05-20 NOTE — Patient Instructions (Signed)
  Drink plenty of fluid Continue the guaifenesin (along with DM if still coughing a lot) Add decongestant such as sudafed, to help as needed for sinus pain.  Your blood pressure is very well controlled and should tolerate this without raising it too much (feel free to periodically monitor your blood pressure, and if >140/90 then stop decongestant).  Start the z-pak today.  Call next week before the holiday if you feel like you are getting worse, or zero improvement.  We would then change your antibiotic (ie Avelox). Otherwise, continue the full z-pak. If by day 10 (today is day 1, the day you are starting the antibiotic) you are not 100% better, then fill the refill and start the second z-pak on day#11.  Do not use the tylenol cold or Dayquil along with the Mucinex, as these both have DM. Instead use a separate decongestant (pseudoephedrine or phenylephrine).

## 2015-05-24 ENCOUNTER — Encounter: Payer: Self-pay | Admitting: Family Medicine

## 2015-05-28 ENCOUNTER — Telehealth: Payer: Self-pay | Admitting: Hematology

## 2015-05-28 NOTE — Telephone Encounter (Signed)
[facility] - moved 12/2 appointments to 12/7. Left message for patient with new d/t for 12/7 @ 1 pm and asking that she disregard the previous message. Schedule mailed.

## 2015-05-31 ENCOUNTER — Telehealth: Payer: Self-pay | Admitting: Hematology

## 2015-05-31 NOTE — Telephone Encounter (Signed)
pt cld to r/s appt-gave r/s time & date 

## 2015-06-03 DIAGNOSIS — D472 Monoclonal gammopathy: Secondary | ICD-10-CM

## 2015-06-03 HISTORY — DX: Monoclonal gammopathy: D47.2

## 2015-06-04 ENCOUNTER — Other Ambulatory Visit: Payer: Medicare Other

## 2015-06-04 ENCOUNTER — Ambulatory Visit: Payer: Medicare Other | Admitting: Hematology

## 2015-06-07 ENCOUNTER — Telehealth: Payer: Self-pay | Admitting: Family Medicine

## 2015-06-07 NOTE — Telephone Encounter (Signed)
Pt called, she needs reorder of Lyrica.  Automotive engineer and reordered Lyrica. Pt also states she needs to reapply and has the forms.  She will complete and bring them to the office

## 2015-06-08 ENCOUNTER — Encounter: Payer: Self-pay | Admitting: Family Medicine

## 2015-06-08 ENCOUNTER — Telehealth: Payer: Self-pay | Admitting: Hematology

## 2015-06-08 NOTE — Telephone Encounter (Signed)
pt left voicemail to verify appt-cld & spoke topt and adv pt of time & date of 12/8 appt@8 

## 2015-06-09 ENCOUNTER — Other Ambulatory Visit: Payer: Medicare Other

## 2015-06-09 ENCOUNTER — Ambulatory Visit: Payer: Medicare Other | Admitting: Hematology

## 2015-06-10 ENCOUNTER — Other Ambulatory Visit (HOSPITAL_BASED_OUTPATIENT_CLINIC_OR_DEPARTMENT_OTHER): Payer: Medicare Other

## 2015-06-10 ENCOUNTER — Encounter: Payer: Self-pay | Admitting: Hematology

## 2015-06-10 ENCOUNTER — Telehealth: Payer: Self-pay | Admitting: Hematology

## 2015-06-10 ENCOUNTER — Ambulatory Visit (HOSPITAL_BASED_OUTPATIENT_CLINIC_OR_DEPARTMENT_OTHER): Payer: Medicare Other | Admitting: Hematology

## 2015-06-10 VITALS — BP 129/82 | HR 80 | Temp 98.1°F | Resp 18 | Ht 66.25 in | Wt 189.4 lb

## 2015-06-10 DIAGNOSIS — D472 Monoclonal gammopathy: Secondary | ICD-10-CM | POA: Diagnosis not present

## 2015-06-10 LAB — CBC & DIFF AND RETIC
BASO%: 0.6 % (ref 0.0–2.0)
Basophils Absolute: 0 10*3/uL (ref 0.0–0.1)
EOS%: 1.3 % (ref 0.0–7.0)
Eosinophils Absolute: 0.1 10*3/uL (ref 0.0–0.5)
HCT: 38.5 % (ref 34.8–46.6)
HGB: 13.1 g/dL (ref 11.6–15.9)
Immature Retic Fract: 6.7 % (ref 1.60–10.00)
LYMPH%: 28.2 % (ref 14.0–49.7)
MCH: 33.5 pg (ref 25.1–34.0)
MCHC: 34 g/dL (ref 31.5–36.0)
MCV: 98.5 fL (ref 79.5–101.0)
MONO#: 0.6 10*3/uL (ref 0.1–0.9)
MONO%: 9.8 % (ref 0.0–14.0)
NEUT#: 3.8 10*3/uL (ref 1.5–6.5)
NEUT%: 60.1 % (ref 38.4–76.8)
Platelets: 327 10*3/uL (ref 145–400)
RBC: 3.91 10*6/uL (ref 3.70–5.45)
RDW: 12.8 % (ref 11.2–14.5)
Retic %: 1.56 % (ref 0.70–2.10)
Retic Ct Abs: 61 10*3/uL (ref 33.70–90.70)
WBC: 6.2 10*3/uL (ref 3.9–10.3)
lymph#: 1.8 10*3/uL (ref 0.9–3.3)

## 2015-06-10 LAB — COMPREHENSIVE METABOLIC PANEL
ALT: 18 U/L (ref 0–55)
AST: 19 U/L (ref 5–34)
Albumin: 4.4 g/dL (ref 3.5–5.0)
Alkaline Phosphatase: 39 U/L — ABNORMAL LOW (ref 40–150)
Anion Gap: 10 mEq/L (ref 3–11)
BUN: 14.2 mg/dL (ref 7.0–26.0)
CO2: 23 mEq/L (ref 22–29)
Calcium: 10.5 mg/dL — ABNORMAL HIGH (ref 8.4–10.4)
Chloride: 106 mEq/L (ref 98–109)
Creatinine: 1 mg/dL (ref 0.6–1.1)
EGFR: 63 mL/min/{1.73_m2} — ABNORMAL LOW (ref >90)
Glucose: 72 mg/dl (ref 70–140)
Potassium: 4 mEq/L (ref 3.5–5.1)
Sodium: 139 mEq/L (ref 136–145)
Total Bilirubin: 0.41 mg/dL (ref 0.20–1.20)
Total Protein: 8.2 g/dL (ref 6.4–8.3)

## 2015-06-10 NOTE — Telephone Encounter (Signed)
Gave and printed appt sched and avs for pt for June 2017 °

## 2015-06-13 ENCOUNTER — Encounter: Payer: Self-pay | Admitting: Family Medicine

## 2015-06-14 ENCOUNTER — Encounter: Payer: Self-pay | Admitting: *Deleted

## 2015-06-14 LAB — SPEP & IFE WITH QIG
Abnormal Protein Band1: 1 g/dL
Albumin ELP: 4.8 g/dL (ref 3.8–4.8)
Alpha-1-Globulin: 0.2 g/dL (ref 0.2–0.3)
Alpha-2-Globulin: 0.6 g/dL (ref 0.5–0.9)
Beta 2: 0.3 g/dL (ref 0.2–0.5)
Beta Globulin: 0.5 g/dL (ref 0.4–0.6)
Gamma Globulin: 1.3 g/dL (ref 0.8–1.7)
IgA: 98 mg/dL (ref 69–380)
IgG (Immunoglobin G), Serum: 1480 mg/dL (ref 690–1700)
IgM, Serum: 51 mg/dL — ABNORMAL LOW (ref 52–322)
Total Protein, Serum Electrophoresis: 7.7 g/dL (ref 6.1–8.1)

## 2015-06-14 LAB — KAPPA/LAMBDA LIGHT CHAINS
Kappa free light chain: 1.29 mg/dL (ref 0.33–1.94)
Kappa:Lambda Ratio: 1.34 (ref 0.26–1.65)
Lambda Free Lght Chn: 0.96 mg/dL (ref 0.57–2.63)

## 2015-06-22 ENCOUNTER — Telehealth: Payer: Self-pay | Admitting: Family Medicine

## 2015-06-22 NOTE — Telephone Encounter (Signed)
Pt Assistance renewal packet completed in your folder, please sign application and I need original Rx for Lyrica and return to [facility]

## 2015-06-23 ENCOUNTER — Other Ambulatory Visit: Payer: Self-pay | Admitting: *Deleted

## 2015-06-23 DIAGNOSIS — G629 Polyneuropathy, unspecified: Secondary | ICD-10-CM

## 2015-06-23 MED ORDER — PREGABALIN 75 MG PO CAPS: 75.0000 mg | ORAL_CAPSULE | Freq: Two times a day (BID) | ORAL | Status: DC

## 2015-06-23 NOTE — Telephone Encounter (Signed)
Form signed; please print rx (and stamp or have me sign)

## 2015-07-01 NOTE — Telephone Encounter (Signed)
Renewal forms faxed on 06/23/15

## 2015-07-07 ENCOUNTER — Other Ambulatory Visit: Payer: Self-pay | Admitting: Family Medicine

## 2015-07-07 NOTE — Progress Notes (Signed)
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 Skeletal survey negative. Bone marrow deferred as per patient choice.  Current Treatment: observation   INTERVAL HISTORY:  [name]  Is here for her scheduled follow-up for IgG lambda MGUS. She reports no new bone pains. No new fatigue. No new anemia, hypercalcemia or change in kidney function.  Notes that she has  Uterine fibroids and has been having a lot of breakthrough bleeding and cramping and has been set up for a biopsy in January 2017 for further workup by her GYN doctor. She notes that she had a cortisone shot in her right knee November 2016. 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 She was told that it is superficial and that she would not need any additional treatments. Pathology results are not available to [place]. I suggested to her that she could have her dermatologist forward [place] the pathology results in case any additional treatment considerations were needed.  REVIEW OF SYSTEMS:    10 Point review of systems of done and is negative except as noted above.  . Past Medical History  Diagnosis Date  . Anxiety panic attacks    [name]  . Hypertension   . Kidney stones h/o  . Chronic neck pain   . Chronic back pain   . Migraine   . Plantar fasciitis right  . Tendonitis of foot right    . Past Surgical History  Procedure Laterality Date  . Neck surgery  [name]  Summer 2011    X4  . Back surgery  Dr.Stern, last sx Summer 2011    X4  . Tonsillectomy    . Rhinoplasty      . Social History  Substance Use Topics  . Smoking status: Former Smoker    Quit date: 07/04/1987  . Smokeless tobacco: Never Used  . Alcohol Use: 0.0 oz/week   0 Standard drinks or equivalent per week     Comment: ONCE A MONTH    ALLERGIES:  is allergic to penicillins; codeine; voltaren; and erythromycin.  MEDICATIONS:  Current Outpatient Prescriptions  Medication Sig Dispense Refill  . amLODipine-benazepril (LOTREL) 10-20 MG capsule Take 1 capsule by mouth daily.    . Ascorbic Acid (VITAMIN C) 1000 MG tablet Take 1,000 mg by mouth daily.    . B Complex Vitamins (B-COMPLEX/B-12 PO) Take by mouth daily.    Marland Kitchen buPROPion (WELLBUTRIN XL) 150 MG 24 hr tablet Take 150 mg by mouth daily.     . Calcium Carb-Cholecalciferol (CALCIUM PLUS VITAMIN D3) 600-800 MG-UNIT TABS Take 1 tablet by mouth daily.    . divalproex (DEPAKOTE) 250 MG DR tablet Take 250 mg by mouth daily.    . ergocalciferol (VITAMIN D2) 50000 UNITS capsule Take 50,000 Units by mouth once a week.    . fenofibrate 160 MG tablet TAKE 1 TABLET (160 MG TOTAL) BY MOUTH DAILY. 90 tablet 0  . FLUoxetine (PROZAC) 40 MG capsule Take 40 mg by mouth daily.     Marland Kitchen JINTELI 1-5 MG-MCG TABS Take 1 tablet by mouth daily.    Marland Kitchen loratadine (CLARITIN) 10 MG tablet Take 10 mg by mouth daily.      Marland Kitchen LORazepam (ATIVAN) 0.5 MG tablet Take 1-2 tablets by mouth  3 (three) times daily as needed. Prn anxiety    . Magnesium Malate POWD 1,250 mg by Does not apply route 2 (two) times daily.    . Multiple Vitamins-Minerals (MULTIVITAMIN WITH MINERALS) tablet Take 1 tablet by mouth daily.      . Omega-3 Fatty Acids (FISH OIL PO) Take 1,400 mg by mouth daily.    . pregabalin (LYRICA) 75 MG capsule Take 1 capsule (75 mg total) by mouth 2 (two) times daily. 180 capsule 3  . Probiotic Product (TRUBIOTICS PO) Take 1 capsule by mouth daily.    Marland Kitchen tiZANidine (ZANAFLEX) 2 MG tablet TAKE 1-2 TABLETS (2-4 MG TOTAL) BY MOUTH EVERY 6 (SIX) HOURS AS NEEDED (MUSCLE SPASM). 30 tablet 0   No current facility-administered medications for this visit.    PHYSICAL EXAMINATION: ECOG PERFORMANCE STATUS: 2  . Filed Vitals:   06/10/15 0832    BP: 129/82  Pulse: 80  Temp: 98.1 F (36.7 C)  Resp: 18    Filed Weights   06/10/15 0832  Weight: 189 lb 6.4 oz (85.911 kg)   .Body mass index is 30.33 kg/(m^2).  GENERAL:alert, in no acute distress and comfortable SKIN: skin color, texture, turgor are normal, no rashes or significant lesions EYES: normal, conjunctiva are pink and non-injected, sclera clear OROPHARYNX:no exudate, no erythema and lips, buccal mucosa, and tongue normal  NECK: supple, no JVD, thyroid normal size, non-tender, without nodularity LYMPH:  no palpable lymphadenopathy in the cervical, axillary or inguinal LUNGS: clear to auscultation with normal respiratory effort HEART: regular rate & rhythm,  no murmurs and no lower extremity edema ABDOMEN: abdomen soft, non-tender, normoactive bowel sounds  Musculoskeletal: no cyanosis of digits and no clubbing  PSYCH: alert & oriented x 3 with fluent speech NEURO: no focal motor/sensory deficits  LABORATORY DATA:   I have reviewed the data as listed  . CBC Latest Ref Rng 06/10/2015 03/12/2015 10/29/2014  WBC 3.9 - 10.3 10e3/uL 6.2 6.8 5.8  Hemoglobin 11.6 - 15.9 g/dL 13.1 13.8 12.9  Hematocrit 34.8 - 46.6 % 38.5 40.4 38.8  Platelets 145 - 400 10e3/uL 327 339 369    . CMP Latest Ref Rng 06/10/2015 03/12/2015 10/29/2014  Glucose 70 - 140 mg/dl 72 85 77  BUN 7.0 - 26.0 mg/dL 14.2 13.5 16  Creatinine 0.6 - 1.1 mg/dL 1.0 1.0 0.88  Sodium 136 - 145 mEq/L 139 140 138  Potassium 3.5 - 5.1 mEq/L 4.0 4.1 4.5  Chloride 96 - 112 mEq/L - - 106  CO2 22 - 29 mEq/L '23 25 22  ' Calcium 8.4 - 10.4 mg/dL 10.5(H) 9.8 9.8  Total Protein 6.4 - 8.3 g/dL 8.2 7.9 8.0  Total Bilirubin 0.20 - 1.20 mg/dL 0.41 0.41 0.4  Alkaline Phos 40 - 150 U/L 39(L) 37(L) 32(L)  AST 5 - 34 U/L '19 18 16  ' ALT 0 - 55 U/L '18 17 17    ' . Lab Results  Component Value Date   TOTALPROTELP 7.7 06/10/2015   ALBUMINELP 4.8 06/10/2015   A1GS 0.2 06/10/2015   A2GS 0.6 06/10/2015   BETS 0.5 06/10/2015    BETA2SER 0.3 06/10/2015   GAMS 1.3 06/10/2015   SPEI * 06/10/2015   (this displays SPEP labs) [facility] Electr Int  * *CM   Comments: Monoclonal IgG lambda protein is present.        Comments: A restricted band consistent with monoclonal protein is present.  The monoclonal protein peak accounts for 1.0 g/dL of the total  1.3 g/dL of protein  in the gamma region.  Results are consistent with SPE performed on 03/15/2015   Lab Results  Component Value Date   KPAFRELGTCHN 1.29 06/10/2015   LAMBDASER 0.96 06/10/2015   KAPLAMBRATIO 1.34 06/10/2015   (kappa/lambda light chains)   RADIOGRAPHIC STUDIES: I have personally reviewed the radiological images as listed and agreed with the findings in the report. No results found.  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 Previous UPEP and skeletal survey negative. No evidence of anemia, renal failure on labs. Mild hypercalcemia likely due to dehydration. No new bone pain. Rpt SPEP shows stable M-spike at 1g/dl with no significant increase. Plan -no indication for Bm Bx or other additional intervention at this time. -encouraged increased fluid intake. -will recheck SPEP and f/u patient in 58month with rpt cbc, cmp and SPEP.  2) Recent Melanoma excision per dermatology. Pathology result not available to [place] -will defer to dermatology regarding further management of the patients melanoma and ongoing skin surveillance. -depending on stage of melanoma/thickness might or might not need addition surgical intervention/SNLBx or medical oncology evaluation by [place] Plz consult [place] needed. -counselld on sun protection.   Continue followup with primary care physician and rheumatology.  RTC with Dr KIrene Limboin 614monthwith cbc, cmp and  SPEP  I spent 20 minutes counseling the patient face to face. The total time spent in the appointment was 25 minutes and more than 50% was on counseling and direct patient cares.    GaSullivan LoneD MSBronxAHIVMS SCEl Paso Psychiatric CenterTHighland Ridge Hospitalematology/Oncology Physician CoPowell Valley Hospital(Office):       [phone] cell):  [phone]):           [phone]

## 2015-07-13 ENCOUNTER — Encounter: Payer: Self-pay | Admitting: Family Medicine

## 2015-07-13 NOTE — Telephone Encounter (Signed)
Called Pt Assistance [phone] to find out about renewal, stated behind due to renewals, asked that I refax to fax # [phone] Samples Lyrica given per Gabriel Cirri 75mg  4 boxes to hold pt.  Pt informed

## 2015-07-19 ENCOUNTER — Telehealth: Payer: Self-pay | Admitting: Family Medicine

## 2015-07-19 ENCOUNTER — Telehealth: Payer: Self-pay

## 2015-07-19 DIAGNOSIS — G43009 Migraine without aura, not intractable, without status migrainosus: Secondary | ICD-10-CM

## 2015-07-19 NOTE — Telephone Encounter (Signed)
[facility] called & states pt is supposed to come in here on Wednesday for lab work and wanted to know if they put in labs in epic if we could just draw them all at that time?

## 2015-07-19 NOTE — Telephone Encounter (Signed)
Spoke w/ patient she is scheduled to have labs drawn at Dr. Johnsie Kindred office ([place]) on Wednesday. Called and spoke to [facility] at that office. Okay to place orders to have drawn there. Order's placed. mont Fam

## 2015-07-19 NOTE — Telephone Encounter (Signed)
-----   Message from Amada Kingfisher, [place] sent at 05/05/2015 11:05 AM EDT ----- Regarding: Needs labs Patient needs CBC and LFT's done before her visit! PLease call and find out what lab she would like to use and send orders! No orders yet placed.

## 2015-07-19 NOTE — Telephone Encounter (Signed)
Going to re-order their orders and send directly to [facility], waiting on his CMA to call me back to okay.

## 2015-07-19 NOTE — Telephone Encounter (Signed)
V--can we release their orders?

## 2015-07-21 ENCOUNTER — Other Ambulatory Visit: Payer: Self-pay | Admitting: *Deleted

## 2015-07-21 ENCOUNTER — Other Ambulatory Visit: Payer: Medicare Other

## 2015-07-21 DIAGNOSIS — E781 Pure hyperglyceridemia: Secondary | ICD-10-CM

## 2015-07-21 DIAGNOSIS — G43009 Migraine without aura, not intractable, without status migrainosus: Secondary | ICD-10-CM

## 2015-07-21 LAB — HEPATIC FUNCTION PANEL
ALT: 32 U/L — ABNORMAL HIGH (ref 6–29)
AST: 18 U/L (ref 10–35)
Albumin: 4.5 g/dL (ref 3.6–5.1)
Alkaline Phosphatase: 32 U/L — ABNORMAL LOW (ref 33–130)
Bilirubin, Direct: <0.1 mg/dL (ref <=0.2)
Total Bilirubin: 0.4 mg/dL (ref 0.2–1.2)
Total Protein: 7.2 g/dL (ref 6.1–8.1)

## 2015-07-21 LAB — CBC WITH DIFFERENTIAL/PLATELET
Basophils Absolute: 0.1 10*3/uL (ref 0.0–0.1)
Basophils Relative: 1 % (ref 0–1)
Eosinophils Absolute: 0.1 10*3/uL (ref 0.0–0.7)
Eosinophils Relative: 1 % (ref 0–5)
HCT: 39 % (ref 36.0–46.0)
Hemoglobin: 12.9 g/dL (ref 12.0–15.0)
Lymphocytes Relative: 33 % (ref 12–46)
Lymphs Abs: 2.3 10*3/uL (ref 0.7–4.0)
MCH: 33.4 pg (ref 26.0–34.0)
MCHC: 33.1 g/dL (ref 30.0–36.0)
MCV: 101 fL — ABNORMAL HIGH (ref 78.0–100.0)
MPV: 9.6 fL (ref 8.6–12.4)
Monocytes Absolute: 0.5 10*3/uL (ref 0.1–1.0)
Monocytes Relative: 7 % (ref 3–12)
Neutro Abs: 4 10*3/uL (ref 1.7–7.7)
Neutrophils Relative %: 58 % (ref 43–77)
Platelets: 337 10*3/uL (ref 150–400)
RBC: 3.86 MIL/uL — ABNORMAL LOW (ref 3.87–5.11)
RDW: 13.1 % (ref 11.5–15.5)
WBC: 6.9 10*3/uL (ref 4.0–10.5)

## 2015-07-21 LAB — LIPID PANEL
Cholesterol: 173 mg/dL (ref 125–200)
HDL: 48 mg/dL (ref >=46)
LDL Cholesterol: 107 mg/dL (ref <130)
Total CHOL/HDL Ratio: 3.6 Ratio (ref <=5.0)
Triglycerides: 91 mg/dL (ref <150)
VLDL: 18 mg/dL (ref <30)

## 2015-07-22 ENCOUNTER — Other Ambulatory Visit: Payer: Self-pay | Admitting: Family Medicine

## 2015-07-26 ENCOUNTER — Encounter: Payer: Self-pay | Admitting: Family Medicine

## 2015-07-26 ENCOUNTER — Ambulatory Visit (INDEPENDENT_AMBULATORY_CARE_PROVIDER_SITE_OTHER): Payer: Medicare Other | Admitting: Family Medicine

## 2015-07-26 VITALS — BP 120/72 | HR 76 | Ht 66.25 in | Wt 193.0 lb

## 2015-07-26 DIAGNOSIS — M797 Fibromyalgia: Secondary | ICD-10-CM | POA: Diagnosis not present

## 2015-07-26 DIAGNOSIS — E781 Pure hyperglyceridemia: Secondary | ICD-10-CM

## 2015-07-26 DIAGNOSIS — D472 Monoclonal gammopathy: Secondary | ICD-10-CM

## 2015-07-26 DIAGNOSIS — I10 Essential (primary) hypertension: Secondary | ICD-10-CM

## 2015-07-26 DIAGNOSIS — G629 Polyneuropathy, unspecified: Secondary | ICD-10-CM | POA: Diagnosis not present

## 2015-07-26 DIAGNOSIS — G43009 Migraine without aura, not intractable, without status migrainosus: Secondary | ICD-10-CM

## 2015-07-26 MED ORDER — AMLODIPINE BESY-BENAZEPRIL HCL 10-20 MG PO CAPS: 1.0000 | ORAL_CAPSULE | Freq: Every day | ORAL | Status: DC

## 2015-07-26 NOTE — Patient Instructions (Signed)
Continue your current medications and lowfat, low cholesterol diet. Contact your pharmacy when refills are needed.

## 2015-07-26 NOTE — Progress Notes (Signed)
Chief Complaint  Patient presents with  . Med check    nonfasting med check.    Dr. Estanislado Pandy referred her to Dr. Irene Limbo (heme-onc), who diagnosed her with MGUS. Has follow up scheduled in June. Counts have been stable, and BM biopsy was deferred/not felt to be needed at this time.  Fibromyalgia--stable at this time. Seeing Dr. Estanislado Pandy, and no additional meds have been prescribed by her. She goes back again in May.  Fatigue fluctuates, some days are much better than others, and is often weather-related.  With the rainy weather we had, she had increase in fatigue and pain. Sometimes has "mental fog", fluctuates, not daily.  She is on Lyrica (from [place]), which has helped tremendously with her pain.  Tolerating without side effects. Neuropathy is significantly improved on Lyrica.  Hypertension follow-up: Blood pressures elsewhere are 120/60's-70. Denies dizziness, chest pain. Denies side effects of medications; no cough, edema.  Hyperlipidemia follow-up:  Patient is reportedly following a low-fat, low cholesterol diet.  Compliant with medications (fenofibrate and 2 fish oil daily) and denies medication side effects.  Headaches:  Seeing Dr. Tomi Likens.  Doing much better since on the Depakote. Continues to use zanaflex prn posterior headaches/muscle pain with good results.  Melanoma RUE--removed prior to Thanksgiving.  Healed well.  Vitamin D deficiency--last checked by Dr. Estanislado Pandy (in end of November) and was okay.  PMH, PSH, SH reviewed  Outpatient Encounter Prescriptions as of 07/26/2015  Medication Sig Note  . amLODipine-benazepril (LOTREL) 10-20 MG capsule Take 1 capsule by mouth daily.   . Ascorbic Acid (VITAMIN C) 1000 MG tablet Take 1,000 mg by mouth daily.   . B Complex Vitamins (B-COMPLEX/B-12 PO) Take by mouth daily.   Marland Kitchen buPROPion (WELLBUTRIN XL) 150 MG 24 hr tablet Take 150 mg by mouth daily.  11/11/2014: Received from: External Pharmacy  . Calcium Carb-Cholecalciferol (CALCIUM PLUS  VITAMIN D3) 600-800 MG-UNIT TABS Take 1 tablet by mouth daily.   . Cholecalciferol (VITAMIN D) 2000 units tablet Take 2,000 Units by mouth daily.   . divalproex (DEPAKOTE) 250 MG DR tablet Take 250 mg by mouth daily.   . fenofibrate 160 MG tablet TAKE 1 TABLET (160 MG TOTAL) BY MOUTH DAILY.   . fexofenadine (ALLEGRA) 180 MG tablet Take 180 mg by mouth daily.   Marland Kitchen FLUoxetine (PROZAC) 40 MG capsule Take 40 mg by mouth daily.  11/11/2014: Received from: External Pharmacy  . JINTELI 1-5 MG-MCG TABS Take 1 tablet by mouth daily. 03/10/2013: Dr. Philis Pique  . LORazepam (ATIVAN) 0.5 MG tablet Take 1-2 tablets by mouth 3 (three) times daily as needed. Prn anxiety 07/26/2015: Uses prn, just 1-2 times/week  . Magnesium Malate POWD Take 1,250 mg by mouth 2 (two) times daily.    . Multiple Vitamins-Minerals (MULTIVITAMIN WITH MINERALS) tablet Take 1 tablet by mouth daily.     . Omega-3 Fatty Acids (FISH OIL PO) Take 1,120 mg by mouth daily.  07/26/2015: Takes 2/day  . pregabalin (LYRICA) 75 MG capsule Take 1 capsule (75 mg total) by mouth 2 (two) times daily.   . Probiotic Product (TRUBIOTICS PO) Take 1 capsule by mouth daily.   Marland Kitchen tiZANidine (ZANAFLEX) 2 MG tablet TAKE 1-2 TABLETS (2-4 MG TOTAL) BY MOUTH EVERY 6 (SIX) HOURS AS NEEDED (MUSCLE SPASM). 07/26/2015: Uses 4-5 times/week, in the evening  . [DISCONTINUED] divalproex (DEPAKOTE ER) 500 MG 24 hr tablet  07/26/2015: Received from: External Pharmacy  . [DISCONTINUED] ergocalciferol (VITAMIN D2) 50000 UNITS capsule Take 50,000 Units by mouth once a  week.   . [DISCONTINUED] loratadine (CLARITIN) 10 MG tablet Take 10 mg by mouth daily.   09/11/2013: Takes prn allergies   No facility-administered encounter medications on file as of 07/26/2015.   Allergies  Allergen Reactions  . Penicillins Shortness Of Breath and Rash  . Codeine Other (See Comments)    Manic depressive  . Voltaren [Diclofenac] Nausea Only and Other (See Comments)    Pt stated "tore stomach up"  .  Erythromycin Rash   ROS: no fever, chills, dizziness, chest pain, shortness of breath, nausea, vomiting, bowel changes, bleeding, bruising, rash, depression. See HPI.  PHYSICAL EXAM: BP 120/72 mmHg  Pulse 76  Ht 5' 6.25" (1.683 m)  Wt 193 lb (87.544 kg)  BMI 30.91 kg/m2  Well developed, well nourished, pleasant female, in good spirits HEENT: PERRL, EOMI, conjunctiva clear. OP clear Neck: No lymphadenopathy or thyromegaly, no carotid bruit Heart: regular rate and rhythm without murmur Lungs: clear bilaterally Abdomen: soft, nontender, no organomegaly or mass Extremities: normal pulses, no edema. Neuro: alert and oriented. Cranial nerves intact. Grossly normal strength, sensation; normal gait. Psych; normal mood, affect, hygiene and grooming  Lab Results  Component Value Date   CHOL 173 07/21/2015   HDL 48 07/21/2015   LDLCALC 107 07/21/2015   TRIG 91 07/21/2015   CHOLHDL 3.6 07/21/2015   Lab Results  Component Value Date   ALT 32* 07/21/2015   AST 18 07/21/2015   ALKPHOS 32* 07/21/2015   BILITOT 0.4 07/21/2015   Lab Results  Component Value Date   WBC 6.9 07/21/2015   HGB 12.9 07/21/2015   HCT 39.0 07/21/2015   MCV 101.0* 07/21/2015   PLT 337 07/21/2015   ASSESSMENT/PLAN:  Essential hypertension, benign - controlled - Plan: amLODipine-benazepril (LOTREL) 10-20 MG capsule  Pure hyperglyceridemia - controlled  MGUS (monoclonal gammopathy of unknown significance) - stable, per heme-onc  Migraine without aura and without status migrainosus, not intractable - improved on current regimen  Neuropathy (HCC) - stable, improved on Lyrica  Fibromyalgia - stable. continue current med regimen  F/u 6 months for CPE Not entering future orders yet--having labs for heme-onc prior to that visit. If wants labs in advance, call a week or two prior to get orders put in--need to see what was done by other providers (asked her to get Dr Arlean Hopping labs sent here). I  recommend just coming fasting.

## 2015-07-28 ENCOUNTER — Other Ambulatory Visit: Payer: Self-pay | Admitting: *Deleted

## 2015-07-28 ENCOUNTER — Encounter: Payer: Self-pay | Admitting: Family Medicine

## 2015-07-28 MED ORDER — AMLODIPINE BESY-BENAZEPRIL HCL 5-10 MG PO CAPS: 1.0000 | ORAL_CAPSULE | Freq: Every day | ORAL | Status: DC

## 2015-08-04 ENCOUNTER — Ambulatory Visit (INDEPENDENT_AMBULATORY_CARE_PROVIDER_SITE_OTHER): Payer: Medicare Other | Admitting: Neurology

## 2015-08-04 ENCOUNTER — Encounter: Payer: Self-pay | Admitting: Neurology

## 2015-08-04 VITALS — BP 116/68 | HR 85 | Ht 66.25 in | Wt 189.0 lb

## 2015-08-04 DIAGNOSIS — G43709 Chronic migraine without aura, not intractable, without status migrainosus: Secondary | ICD-10-CM | POA: Diagnosis not present

## 2015-08-04 DIAGNOSIS — G44229 Chronic tension-type headache, not intractable: Secondary | ICD-10-CM | POA: Diagnosis not present

## 2015-08-04 DIAGNOSIS — M5417 Radiculopathy, lumbosacral region: Secondary | ICD-10-CM | POA: Diagnosis not present

## 2015-08-04 MED ORDER — DIVALPROEX SODIUM ER 500 MG PO TB24: 500.0000 mg | ORAL_TABLET | Freq: Every day | ORAL | Status: DC

## 2015-08-04 MED ORDER — METHYLPREDNISOLONE 4 MG PO TBPK: ORAL_TABLET | ORAL | Status: DC

## 2015-08-04 NOTE — Progress Notes (Signed)
NEUROLOGY FOLLOW UP OFFICE NOTE  KHARLIE FARRANT [id number]  HISTORY OF PRESENT ILLNESS: Danyia Nishikawa is a 50 year old right-handed woman with chronic neck and back pain with neuropathy, fibromyalgia, hypertension, plantar fasciitis and history of kidney stones and depression who follows up for migraine, as well as new issue, acute back pain.  UPDATE: Tension headaches now every other day.  Migraines are reduced in frequency Intensity:  8-9/10 Duration:  3 to 4 hours Frequency:  3 to 4 days per month Current abortive medication:  Uses ice packs.  Has not tried Debara Pickett powder spray yet Antihypertensive medications:  amlodipine Antidepressant medications:  wellbutrin, fluoxetine Anticonvulsant medications:  Depakote 250mg  (I had increased it to 500mg , but for some reason she is back on 250mg ), Lyrica 75mg  twice daily Vitamins/Herbal/Supplements:  Magnesium maleate 1250mg  twice daily She takes tizanidine sometimes for neck tightness  07/21/15 Labs:  WBC 6.9, HCT 12.9, HCT 39, PLT 337, TB 0.4, ALP 32, AST 18, ALT 32.  A week ago, she was preparing for an Advertising account executive.  She didn't think she did any heavy lifting, but she developed acute onset of low back pain with pain radiating down the posterior thigh, lateral lower leg and dorsum of foot to big toe on the right.  There is no associated numbness or weakness.  She has taken Flexeril, which is somewhat helpful, but makes her sleepy.  Pain is improved but she still notes pain when she stands up.  HISTORY: Onset:  Childhood Location:  Varies (back of head, crown, bi-temporal, retro-orbital, band-like) Quality:  Retro-orbital throbbing, band-like vice, stabbing at back of head Initial Intensity:  4/10 constant, 8/10 when severe; November 5-6/10 Aura:  no Prodrome:  no Associated symptoms:  Nausea, photophobia, phonophobia, tunnel vision Initial Duration:  Constant but severe episodes 8 hours (with sumatriptan and naproxen);  November 2 hours Initial Frequency:  Daily but severe episodes occur once a week; November 2 days a week Triggers/exacerbating factors:  stress Relieving factors:  Ice pack Activity:  Cannot function at least once a week.  Past abortive therapy:  Relpax 40mg , Treximet, Maxalt, Zomig po, Tramadol, sumatriptan 100mg  w/wo naproxen, sumatriptan NS, Zomig NS Past preventative therapy:  Cymbalta, Topamax, nortriptyline, sertraline, Effexor, zonisamide, propranolol (hypotension), biofeedback.    To further evaluate numbness and weakness in right arm, he had an MRI of the cervical spine performed on 11/26/14, which showed post surgical changes from C4-T2 fusion.  There was also left disc protrusion at C3-4 where it deforms the left hemi-cord and deflects the cord to the right.  She had a NCV-EMG performed on 12/08/14, which was normal.  A NCV-EMG was performed on 12/08/14, which was normal.   Her wrist still hurts. She is seeing an orthopedist.  PAST MEDICAL HISTORY: Past Medical History  Diagnosis Date  . Anxiety panic attacks    [name]  . Hypertension   . Kidney stones h/o  . Chronic neck pain   . Chronic back pain   . Migraine   . Plantar fasciitis right  . Tendonitis of foot right    MEDICATIONS: Current Outpatient Prescriptions on File Prior to Visit  Medication Sig Dispense Refill  . amLODipine-benazepril (LOTREL) 5-10 MG capsule Take 1 capsule by mouth daily. 90 capsule 1  . Ascorbic Acid (VITAMIN C) 1000 MG tablet Take 1,000 mg by mouth daily.    . B Complex Vitamins (B-COMPLEX/B-12 PO) Take by mouth daily.    Marland Kitchen buPROPion (WELLBUTRIN XL) 150 MG 24  hr tablet Take 150 mg by mouth daily.     . Calcium Carb-Cholecalciferol (CALCIUM PLUS VITAMIN D3) 600-800 MG-UNIT TABS Take 1 tablet by mouth daily.    . Cholecalciferol (VITAMIN D) 2000 units tablet Take 2,000 Units by mouth daily.    . fenofibrate 160 MG tablet TAKE 1 TABLET (160 MG TOTAL) BY MOUTH DAILY. 90 tablet 0  . fexofenadine  (ALLEGRA) 180 MG tablet Take 180 mg by mouth daily.    Marland Kitchen FLUoxetine (PROZAC) 40 MG capsule Take 40 mg by mouth daily.     Marland Kitchen JINTELI 1-5 MG-MCG TABS Take 1 tablet by mouth daily.    Marland Kitchen LORazepam (ATIVAN) 0.5 MG tablet Take 1-2 tablets by mouth 3 (three) times daily as needed. Prn anxiety    . Magnesium Malate POWD Take 1,250 mg by mouth 2 (two) times daily.     . Multiple Vitamins-Minerals (MULTIVITAMIN WITH MINERALS) tablet Take 1 tablet by mouth daily.      . Omega-3 Fatty Acids (FISH OIL PO) Take 1,120 mg by mouth daily.     . pregabalin (LYRICA) 75 MG capsule Take 1 capsule (75 mg total) by mouth 2 (two) times daily. 180 capsule 3  . Probiotic Product (TRUBIOTICS PO) Take 1 capsule by mouth daily.    Marland Kitchen tiZANidine (ZANAFLEX) 2 MG tablet TAKE 1-2 TABLETS (2-4 MG TOTAL) BY MOUTH EVERY 6 (SIX) HOURS AS NEEDED (MUSCLE SPASM). 30 tablet 0   No current facility-administered medications on file prior to visit.    ALLERGIES: Allergies  Allergen Reactions  . Penicillins Shortness Of Breath and Rash  . Codeine Other (See Comments)    Manic depressive  . Voltaren [Diclofenac] Nausea Only and Other (See Comments)    Pt stated "tore stomach up"  . Erythromycin Rash    FAMILY HISTORY: Family History  Problem Relation Age of Onset  . Hypertension Mother   . Depression Mother   . Migraines Mother   . Hypothyroidism Mother   . COPD Father   . Heart failure Father   . Breast cancer Cousin     40's  . Cancer Cousin     breast; in her 28's  . Diabetes Neg Hx     SOCIAL HISTORY: Social History   Social History  . Marital Status: Married    Spouse Name: N/A  . Number of Children: N/A  . Years of Education: N/A   Occupational History  . Not on file.   Social History Main Topics  . Smoking status: Former Smoker    Quit date: 07/04/1987  . Smokeless tobacco: Never Used  . Alcohol Use: 0.0 oz/week    0 Standard drinks or equivalent per week     Comment: ONCE A MONTH  . Drug Use: No   . Sexual Activity:    Partners: Male    Birth Control/ Protection: Other-see comments     Comment: husband had vasectomy   Other Topics Concern  . Not on file   Social History Narrative   Married.  Lives with husband, 1 daughter, brother-in-law (with Down's syndrome), 2 dogs.   2 other children live locally. Disabled due to anxiety.  Previously worked as Public relations account executive.    REVIEW OF SYSTEMS: Constitutional: No fevers, chills, or sweats, no generalized fatigue, change in appetite Eyes: No visual changes, double vision, eye pain Ear, nose and throat: No hearing loss, ear pain, nasal congestion, sore throat Cardiovascular: No chest pain, palpitations Respiratory:  No shortness of breath at rest  or with exertion, wheezes GastrointestinaI: No nausea, vomiting, diarrhea, abdominal pain, fecal incontinence Genitourinary:  No dysuria, urinary retention or frequency Musculoskeletal:  back pain Integumentary: No rash, pruritus, skin lesions Neurological: as above Psychiatric: depression Endocrine: No palpitations, fatigue, diaphoresis, mood swings, change in appetite, change in weight, increased thirst Hematologic/Lymphatic:  No anemia, purpura, petechiae. Allergic/Immunologic: no itchy/runny eyes, nasal congestion, recent allergic reactions, rashes  PHYSICAL EXAM: Filed Vitals:   08/04/15 1333  BP: 116/68  Pulse: 85   General: No acute distress.  Patient appears well-groomed.  Head:  Normocephalic/atraumatic Eyes:  Fundoscopic exam unremarkable without vessel changes, exudates, hemorrhages or papilledema. Neck: supple, no paraspinal tenderness, full range of motion Heart:  Regular rate and rhythm Lungs:  Clear to auscultation bilaterally Back: No paraspinal tenderness Neurological Exam: alert and oriented to person, place, and time. Attention span and concentration intact, recent and remote memory intact, fund of knowledge intact.  Speech fluent and not dysarthric, language  intact.  CN II-XII intact. Fundoscopic exam unremarkable without vessel changes, exudates, hemorrhages or papilledema.  Bulk and tone normal, muscle strength 5/5 throughout.  Sensation to light touch, temperature and vibration intact.  Deep tendon reflexes 2+ throughout.  Finger to nose and heel to shin testing intact.  Gait normal  IMPRESSION: Chronic migraine without aura Tension type headaches Lumbosacral radiculopathy on right, L5  PLAN: 1.  Increase to Depakote ER 500mg  daily to see if it will help reduce the nagging headache 2.  Onzetra Xsail powder spray 3.  Medrol Dosepak for back pain.  If pain persists, will refer for PT 4.  Follow up in 6 months  Metta Clines, DO  CC: Rita Ohara, MD

## 2015-08-04 NOTE — Patient Instructions (Signed)
1.  Sent new prescription for depakote ER 500mg  daily.  2.  Will call in Medrol Dosepak, a steroid to help decrease inflammation of back pain.  After complete, call with update and if pain still persists, will refer you to physical therapy. 3.  Onzetra Xsail spray for acute onset of migraine 4.  Follow up in 6 months.  Check CBC and LFTs prior to follow up.

## 2015-08-11 NOTE — Telephone Encounter (Signed)
Called [employer] & states pt was approved until 07/02/16 & order was shipped on 07/28/15 for 90 days and pt will need to call them after 10/12/15 to please reorder. Left message for pt

## 2015-08-22 ENCOUNTER — Encounter: Payer: Self-pay | Admitting: Neurology

## 2015-08-22 DIAGNOSIS — M5410 Radiculopathy, site unspecified: Secondary | ICD-10-CM

## 2015-08-23 MED ORDER — GABAPENTIN 100 MG PO CAPS: 100.0000 mg | ORAL_CAPSULE | Freq: Three times a day (TID) | ORAL | Status: DC

## 2015-08-23 MED ORDER — TIZANIDINE HCL 4 MG PO TABS: 4.0000 mg | ORAL_TABLET | Freq: Four times a day (QID) | ORAL | Status: DC | PRN

## 2015-08-23 NOTE — Telephone Encounter (Signed)
Please see mychart message  Thank you

## 2015-08-23 NOTE — Telephone Encounter (Signed)
Medication and referral placed.

## 2015-08-31 ENCOUNTER — Ambulatory Visit (INDEPENDENT_AMBULATORY_CARE_PROVIDER_SITE_OTHER): Payer: Medicare Other | Admitting: Family Medicine

## 2015-08-31 ENCOUNTER — Encounter: Payer: Self-pay | Admitting: Family Medicine

## 2015-08-31 VITALS — BP 106/60 | HR 60 | Temp 98.2°F | Wt 184.0 lb

## 2015-08-31 DIAGNOSIS — J069 Acute upper respiratory infection, unspecified: Secondary | ICD-10-CM

## 2015-08-31 DIAGNOSIS — R059 Cough, unspecified: Secondary | ICD-10-CM

## 2015-08-31 DIAGNOSIS — R05 Cough: Secondary | ICD-10-CM | POA: Diagnosis not present

## 2015-08-31 MED ORDER — AZITHROMYCIN 250 MG PO TABS: ORAL_TABLET | ORAL | Status: DC

## 2015-08-31 MED ORDER — ALBUTEROL SULFATE HFA 108 (90 BASE) MCG/ACT IN AERS: 2.0000 | INHALATION_SPRAY | Freq: Four times a day (QID) | RESPIRATORY_TRACT | Status: DC | PRN

## 2015-08-31 NOTE — Progress Notes (Signed)
Subjective:  Lindsey Rodriguez is a 50 y.o. female who presents for a one week history of symptoms that started with fever, chills, body aches, post nasal drainage, bilateral ear pain and congestion. Congested cough started 5 days ago and occasionally has a productive cough. Reports some dizziness with standing up too fast or when coughing. She did receive the flu shot this season. Has history of bronchitis and states she gets this at least 2 times per year. Is not a smoker. Positive sick contacts. Has underlying allergies but has not been taking Allegra. No recent antibiotic use per patient.   Denies fever and sore throat, nausea, vomiting, diarrhea.   Treatment to date: cough suppressants and decongestants, tylenol.  No other aggravating or relieving factors.  No other c/o.  ROS as in subjective.   Objective: Filed Vitals:   08/31/15 1541  BP: 106/60  Pulse: 60  Temp: 98.2 F (36.8 C)    General appearance: Alert, WD/WN, no distress, mildly ill appearing                             Skin: warm, no rash                           Head: no sinus tenderness                            Eyes: conjunctiva normal, corneas clear, PERRLA                            Ears: pearly TMs, external ear canals normal                          Nose: septum midline, turbinates swollen, with erythema and clear discharge             Mouth/throat: MMM, tongue normal, mild pharyngeal erythema                           Neck: supple, no adenopathy, no thyromegaly, nontender                          Heart: RRR, normal S1, S2, no murmurs                         Lungs: CTA bilaterally, no wheezes, rales, or rhonchi     Assessment: Cough - Plan: albuterol (PROVENTIL HFA;VENTOLIN HFA) 108 (90 Base) MCG/ACT inhaler, azithromycin (ZITHROMAX Z-PAK) 250 MG tablet  Acute URI   Plan: Discussed diagnosis and treatment of URI and possible bronchitis.  Suggested symptomatic OTC remedies such as Delsym or Mucinex  DM. Nasal saline spray for congestion.  Tylenol or Ibuprofen OTC for fever and malaise.  Albuterol inhaler prescribed for temporary relief of cough and chest congestion. Z-pak prescribed and she will let me know if not back to baseline after completing it.

## 2015-08-31 NOTE — Patient Instructions (Signed)
I recommend that you continue treating your symptoms and stay well hydrated. Try Mucinex DM or Delsym for cough. If you're not back to baseline after completing the antibiotic let me know.  I am prescribing an albuterol inhaler for you to use during this illness but you should not need this long-term.

## 2015-09-07 ENCOUNTER — Ambulatory Visit: Payer: Medicare Other | Attending: Neurology | Admitting: Physical Therapy

## 2015-09-07 DIAGNOSIS — M5416 Radiculopathy, lumbar region: Secondary | ICD-10-CM

## 2015-09-07 DIAGNOSIS — M5441 Lumbago with sciatica, right side: Secondary | ICD-10-CM | POA: Diagnosis present

## 2015-09-07 DIAGNOSIS — M2560 Stiffness of unspecified joint, not elsewhere classified: Secondary | ICD-10-CM | POA: Diagnosis present

## 2015-09-07 DIAGNOSIS — R293 Abnormal posture: Secondary | ICD-10-CM | POA: Insufficient documentation

## 2015-09-07 DIAGNOSIS — M6281 Muscle weakness (generalized): Secondary | ICD-10-CM | POA: Insufficient documentation

## 2015-09-07 NOTE — Therapy (Signed)
[facility] [address] [place], [place], [address] Phone: [phone]   Fax:  [phone]  Physical Therapy Treatment  Patient Details  Name: Lindsey Rodriguez MRN: [id number] Date of Birth: [birth year] Referring Provider: Dr. Tomi Likens  Encounter Date: 09/07/2015      PT End of Session - 09/07/15 1316    Visit Number 1   Number of Visits 10   Date for PT Re-Evaluation 11/02/15   Authorization Type [employer] Medicare   PT Start Time 1146   PT Stop Time 1230   PT Time Calculation (min) 44 min   Activity Tolerance Patient limited by pain      Past Medical History  Diagnosis Date  . Anxiety panic attacks    [name]  . Hypertension   . Kidney stones h/o  . Chronic neck pain   . Chronic back pain   . Migraine   . Plantar fasciitis right  . Tendonitis of foot right    Past Surgical History  Procedure Laterality Date  . Neck surgery  [name]  Summer 2011    X4  . Back surgery  Dr.Stern, last sx Summer 2011    X4  . Tonsillectomy    . Rhinoplasty      There were no vitals filed for this visit.  Visit Diagnosis:  Lumbar radicular pain - Plan: PT plan of care cert/re-cert  Right-sided low back pain with right-sided sciatica - Plan: PT plan of care cert/re-cert  Posture abnormality - Plan: PT plan of care cert/re-cert  Joint stiffness - Plan: PT plan of care cert/re-cert  Muscle weakness - Plan: PT plan of care cert/re-cert      Subjective Assessment - 09/07/15 1150    Subjective Getting for yardsale and overdid it, lifting and carrying, up/down steps started having shooting pains right leg that afternoon (Jan 24th).  Saw Dr. Tomi Likens, took round of prednisone and it eased off.  But then worsened, changed meds but no help.  4 back surgeries with last surgery 6 years ago (Dr. Vertell Limber did last 2 surgeries).  History of right and left leg pain.  Pain with coughing.  N/T  right perineum constant x 4 weeks.  Right LE  weakness/feeling of give-way with prolonged standing/walking and especially after sitting.  Give way 2 x.     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 10 min   How long can you walk comfortably? 15 min   Diagnostic tests No recent x-rays or MRI.   Patient Stated Goals alleviate the pain and strengthen muscles so I can move more   Currently in Pain? Yes   Pain Score 4    Pain Location Back   Pain Orientation Right   Pain Type Acute pain   Pain Radiating Towards right LE buttock, post thigh to mid calf    Pain Onset More than a month ago   Pain Frequency Constant   Aggravating Factors  riding in the car, driving, sitting   Pain Relieving Factors flat on back with knees elevated;  heat/ice with no benefit            [facility] PT Assessment - 09/07/15 0001    Assessment   Medical Diagnosis radiculopathy   Referring Provider Dr. Tomi Likens   Onset Date/Surgical  Date 07/27/15   Hand Dominance Right   Next MD Visit 4 weeks   Prior Therapy pre and post surgery for neck and back  first disc rupture at age 50   Precautions   Precautions None   Restrictions   Weight Bearing Restrictions No   Balance Screen   Has the patient fallen in the past 6 months No   Has the patient had a decrease in activity level because of a fear of falling?  Yes  b/c of weakness in leg   Is the patient reluctant to leave their home because of a fear of falling?  No   Home Ecologist residence   Living Arrangements Spouse/significant other   Available Help at Discharge Family   Type of Diagonal to enter   Home Layout Laundry or work area in basement   Prior [place] On disability  back and panic attacks   Leisure hang  out with friends, read  3 adult children   Observation/Other Assessments   Focus on Therapeutic Outcomes (FOTO)  50% limitation   Posture/Postural Control   Posture/Postural Control Postural limitations   Postural Limitations Decreased lumbar lordosis   ROM / Strength   AROM / PROM / Strength AROM;Strength   AROM   AROM Assessment Site Lumbar   Lumbar Flexion 50   Lumbar Extension 15   Lumbar - Right Side Bend 30   Lumbar - Left Side Bend 30   Strength   Overall Strength Comments Right LE strength grossly 4+/5 with pain produced with resistance   Strength Assessment Site Lumbar   Lumbar Flexion 4/5   Lumbar Extension 4/5   Palpation   Palpation comment Tender right gluteals   Special Tests    Special Tests Lumbar   Lumbar Tests FABER test;Slump Test;Prone Knee Bend Test;Straight Leg Raise   FABER test   findings Positive   Side Right   Slump test   Findings Positive   Side Right   Straight Leg Raise   Findings Positive   Side  Right                             PT Education - 09/07/15 1316    Education provided Yes   Education Details supine decompression ex   Person(s) Educated Patient   Methods Handout;Explanation   Comprehension Verbalized understanding          PT Short Term Goals - 09/07/15 1403    PT SHORT TERM GOAL #1   Title   The patient will report an understanding of basic self management, pain control strategies including use of lumbar roll when sitting, sitting limit, frequent change of position 10/05/15   Time 4   Period Weeks   Status New   PT SHORT TERM GOAL #2   Title The patient will report centralization of symptoms 25% of the time   Time 4   Period Weeks   Status New   PT SHORT TERM GOAL #3   Title The patient will be able to stand/walk for 20 min with minimal increase in pain   Time 4   Period Weeks   Status New   PT SHORT TERM GOAL #4   Title The patient will report a 25% improvement in sitting tolerance for  driving and riding in the car   Time 4   Period Weeks  Status New           PT Long Term Goals - 09/23/2015 1412    PT LONG TERM GOAL #1   Title The patient will be independent in safe self progression of HEP for further ROM, strengthening and pain control  11/02/15   Time 8   Period Weeks   Status New   PT LONG TERM GOAL #2   Title Symptoms will be centralized 50% of the time with usual ADLs     Time 8   Period Weeks   Status New   PT LONG TERM GOAL #3   Title The patient will be able to drive or ride in the car for 30 minutes with minimal pain increase    Time 8   Period Weeks   Status New   PT LONG TERM GOAL #4   Title Core strength and right LE strength 4+/5 to 5-/5 needed for walking and standing longer periods of time   Time 8   Period Weeks   Status New   PT LONG TERM GOAL #5   Title FOTO functional outcome score improved from 50% to 37% indicating improved function with less pain   Time 8   Period Weeks   Status New               Plan - Sep 23, 2015 1317    Clinical Impression Statement The patient presents for moderately complex evaluation for right LBP with radiating symptoms to right posterior thigh and calf.  Symptoms have been present since 07/27/15.  PMH significant for 4 previous back surgeries with fusion from L2-S1 (last surgery 4 years ago).    Current symptoms also include constant numbness/tingling right perineum and pain with coughing.  Her pain is worsened with riding in the car, driving and walking after sitting a while.  She has a feeling of weakness or give-way in right knee with total give way 2x. Pain limited lumbar AROM.  No directional preference.  She has not been able to lie prone in many years.  Right LE WFLS but with pain with resistance.  + Right FABER, +Slump and + SLR on right.  Positive response to manual traction.    Discussed findings and concerning symptoms with patient and will initiate 2 week PT trial for decompressive strategies and  pain relief.       Pt will benefit from skilled therapeutic intervention in order to improve on the following deficits Decreased activity tolerance;Pain;Decreased strength;Decreased range of motion;Increased muscle spasms;Postural dysfunction   Rehab Potential Good   Clinical Impairments Affecting Rehab Potential hx of 8 spinal surgeries, fibromyalgia, panic attacks and depression   PT Frequency 2x / week   PT Duration 8 weeks   PT Treatment/Interventions ADLs/Self Care Home Management;Cryotherapy;Electrical Stimulation;Moist Heat;Traction;Ultrasound;Therapeutic exercise;Neuromuscular re-education;Patient/family education;Taping;Dry needling;Manual techniques   PT Next Visit Plan check response to supine decompress ex's;  lumbar mechanical traction;  heat/IFC in supine          G-Codes - 09-23-2015 1418    Functional Assessment Tool Used FOTO clinical judgement    Functional Limitation Mobility: Walking and moving around   Mobility: Walking and Moving Around Current Status [phone]) At least 40 percent but less than 60 percent impaired, limited or restricted   Mobility: Walking and Moving Around Goal Status [phone]) At least 20 percent but less than 40 percent impaired, limited or restricted      Problem List Patient Active Problem List   Diagnosis Date Noted  . Chronic  migraine without aura without status migrainosus, not intractable 08/04/2015  . Fibromyalgia 07/26/2015  . MGUS (monoclonal gammopathy of unknown significance) 06/10/2015  . Cervical disc disease 03/02/2015  . Migraine headache 09/05/2012  . Fatigue 10/26/2011  . Pure hyperglyceridemia 04/27/2011  . Essential hypertension, benign 04/17/2011  . Unspecified vitamin D deficiency 04/17/2011  . Neuropathy ([place]) 12/22/2010  . Headache 12/22/2010    Alvera Singh 09/07/2015, 3:38 PM  [facility] [address], [place], [place], [address] Phone: [phone]    Fax:  [phone]  Name: Lindsey Rodriguez MRN: [id number] Date of Birth: [birth year]

## 2015-09-07 NOTE — Patient Instructions (Signed)
RE-ALIGNMENT ROUTINE EXERCISES-OSTEOPROROSIS BASIC FOR POSTURAL CORRECTION   RE-ALIGNMENT Tips BENEFITS: 1.It helps to re-align the curves of the back and improve standing posture. 2.It allows the back muscles to rest and strengthen in preparation for more activity. FREQUENCY: Daily, even after weeks, months and years of more advanced exercises. START: 1.All exercises start in the same position: lying on the back, arms resting on the supporting surface, palms up and slightly away from the body, backs of hands down, knees bent, feet flat. 2.The head, neck, arms, and legs are supported according to specific instructions of your therapist. Copyright  VHI. All rights reserved.    1. Decompression Exercise: Basic.   Takes compression off the vertebral bodies; increases tolerance for lying on the back; helps relieve back pain   Lie on back on firm surface, knees bent, feet flat, arms turned up, out to sides (~35 degrees). Head neck and arms supported as necessary. Time _5-15__ minutes. Surface: floor     2. Shoulder Press  Strengthens upper back extensors and scapular retractors.   Press both shoulders down. Hold _2-3__ seconds. Repeat _3-5__ times. Surface: floor        3. Head Press With Chin Tuck  Strengthens neck extensors   Tuck chin SLIGHTLY toward chest, keep mouth closed. Feel weight on back of head. Increase weight by pressing head down. Hold _2-3__ seconds. Relax. Repeat 3-5___ times. Surface: floor   4. Leg Lengthener: stretches quadratus lumborum and hip flexors.  Strengthens quads and ankle dorsiflexors.   [facility] [address], [address] [place], [place] [address] Phone # [phone] Fax [phone] 

## 2015-09-07 NOTE — Therapy (Signed)
[facility] [address], [place], [place], [address] Phone: [phone]   Fax:  [phone]  Physical Therapy Evaluation  Patient Details  Name: Lindsey Rodriguez MRN: [id number] Date of Birth: Nov 14, 1965 Referring Provider: Dr. Tomi Likens  Encounter Date: 09/07/2015      PT End of Session - 09/07/15 1316    Visit Number 1   Number of Visits 10   Date for PT Re-Evaluation 11/02/15   Authorization Type UHC Medicare   PT Start Time 1146   PT Stop Time 1230   PT Time Calculation (min) 44 min   Activity Tolerance Patient limited by pain      Past Medical History  Diagnosis Date  . Anxiety panic attacks    [name]  . Hypertension   . Kidney stones h/o  . Chronic neck pain   . Chronic back pain   . Migraine   . Plantar fasciitis right  . Tendonitis of foot right    Past Surgical History  Procedure Laterality Date  . Neck surgery  [name]  Summer 2011    X4  . Back surgery  Dr.Stern, last sx Summer 2011    X4  . Tonsillectomy    . Rhinoplasty      There were no vitals filed for this visit.  Visit Diagnosis:  Lumbar radicular pain - Plan: PT plan of care cert/re-cert  Right-sided low back pain with right-sided sciatica - Plan: PT plan of care cert/re-cert  Posture abnormality - Plan: PT plan of care cert/re-cert  Joint stiffness - Plan: PT plan of care cert/re-cert  Muscle weakness - Plan: PT plan of care cert/re-cert      Subjective Assessment - 09/07/15 1150    Subjective Getting for yardsale and overdid it, lifting and carrying, up/down steps started having shooting pains right leg that afternoon (Jan 24th).  Saw Dr. Tomi Likens, took round of prednisone and it eased off.  But then worsened, changed meds but no help.  4 back surgeries with last surgery 6 years ago (Dr. Vertell Limber did last 2 surgeries).  History of right and left leg pain.  Pain with coughing.  N/T  right perineum constant x 4 weeks.  Right LE  weakness/feeling of give-way with prolonged standing/walking and especially after sitting.  Give way 2 x.     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 10 min   How long can you walk comfortably? 15 min   Diagnostic tests No recent x-rays or MRI.   Patient Stated Goals alleviate the pain and strengthen muscles so I can move more   Currently in Pain? Yes   Pain Score 4    Pain Location Back   Pain Orientation Right   Pain Type Acute pain   Pain Radiating Towards right LE buttock, post thigh to mid calf    Pain Onset More than a month ago   Pain Frequency Constant   Aggravating Factors  riding in the car, driving, sitting   Pain Relieving Factors flat on back with knees elevated;  heat/ice with no benefit            [facility] PT Assessment - 09/07/15 0001    Assessment   Medical Diagnosis radiculopathy   Referring Provider Dr. Tomi Likens   Onset Date/Surgical  Date 07/27/15   Hand Dominance Right   Next MD Visit 4 weeks   Prior Therapy pre and post surgery for neck and back  first disc rupture at age 46   Precautions   Precautions None   Restrictions   Weight Bearing Restrictions No   Balance Screen   Has the patient fallen in the past 6 months No   Has the patient had a decrease in activity level because of a fear of falling?  Yes  b/c of weakness in leg   Is the patient reluctant to leave their home because of a fear of falling?  No   Home Ec[employer] residence   Living Arrangements Spouse/significant other   Available Help at Discharge Family   Type of [place] to enter   Home Layout Laundry or work area in basement   Prior [place] On disability  back and panic attacks   Leisure hang  out with friends, read  3 adult children   Observation/Other Assessments   Focus on Therapeutic Outcomes (FOTO)  50% limitation   Posture/Postural Control   Posture/Postural Control Postural limitations   Postural Limitations Decreased lumbar lordosis   ROM / Strength   AROM / PROM / Strength AROM;Strength   AROM   AROM Assessment Site Lumbar   Lumbar Flexion 50   Lumbar Extension 15   Lumbar - Right Side Bend 30   Lumbar - Left Side Bend 30   Strength   Overall Strength Comments Right LE strength grossly 4+/5 with pain produced with resistance   Strength Assessment Site Lumbar   Lumbar Flexion 4/5   Lumbar Extension 4/5   Palpation   Palpation comment Tender right gluteals   Special Tests    Special Tests Lumbar   Lumbar Tests FABER test;Slump Test;Prone Knee Bend Test;Straight Leg Raise   FABER test   findings Positive   Side Right   Slump test   Findings Positive   Side Right   Straight Leg Raise   Findings Positive   Side  Right                           PT Education - 09/07/15 1316    Education provided Yes   Education Details supine decompression ex   Person(s) Educated Patient   Methods Handout;Explanation   Comprehension Verbalized understanding          PT Short Term Goals - 09/07/15 1403    PT SHORT TERM GOAL #1   Title   The patient will report an understanding of basic self management, pain control strategies including use of lumbar roll when sitting, sitting limit, frequent change of position 10/05/15   Time 4   Period Weeks   Status New   PT SHORT TERM GOAL #2   Title The patient will report centralization of symptoms 25% of the time   Time 4   Period Weeks   Status New   PT SHORT TERM GOAL #3   Title The patient will be able to stand/walk for 20 min with minimal increase in pain   Time 4   Period Weeks   Status New   PT SHORT TERM GOAL #4   Title The patient will report a 25% improvement in sitting tolerance for driving  and riding in the car   Time 4   Period Weeks   Status New  PT Long Term Goals - 09-24-2015 1412    PT LONG TERM GOAL #1   Title The patient will be independent in safe self progression of HEP for further ROM, strengthening and pain control  10/05/15   Time 8   Period Weeks   Status New   PT LONG TERM GOAL #2   Title Symptoms will be centralized 50% of the time with usual ADLs     Time 8   Period Weeks   Status New   PT LONG TERM GOAL #3   Title The patient will be able to drive or ride in the car for 30 minutes with minimal pain increase    Time 8   Period Weeks   Status New   PT LONG TERM GOAL #4   Title Core strength and right LE strength 4+/5 to 5-/5 needed for walking and standing longer periods of time   Time 8   Period Weeks   Status New   PT LONG TERM GOAL #5   Title FOTO functional outcome score improved from 50% to 37% indicating improved function with less pain   Time 8   Period Weeks   Status New               Plan - September 24, 2015 1317    Clinical Impression Statement The patient presents for moderately complex evaluation for right LBP with radiating symptoms to right posterior thigh and calf.  Symptoms have been present since 07/27/15.  PMH significant for 4 previous back surgeries with fusion from L2-S1 (last surgery 4 years ago).    Current symptoms also include constant numbness/tingling right perineum and pain with coughing.  Her pain is worsened with riding in the car, driving and walking after sitting a while.  She has a feeling of weakness or give-way in right knee with total give way 2x. Pain limited lumbar AROM.  No directional preference.  She has not been able to lie prone in many years.  Right LE WFLS but with pain with resistance.  + Right FABER, +Slump and + SLR on right.  Positive response to manual traction.    Discussed findings and concerning symptoms with patient and will initiate 2 week PT trial for decompressive strategies and pain  relief.       Pt will benefit from skilled therapeutic intervention in order to improve on the following deficits Decreased activity tolerance;Pain;Decreased strength;Decreased range of motion;Increased muscle spasms;Postural dysfunction   Rehab Potential Good   Clinical Impairments Affecting Rehab Potential hx of 8 spinal surgeries, fibromyalgia, panic attacks and depression   PT Frequency 2x / week   PT Duration 8 weeks   PT Treatment/Interventions ADLs/Self Care Home Management;Cryotherapy;Electrical Stimulation;Moist Heat;Traction;Ultrasound;Therapeutic exercise;Neuromuscular re-education;Patient/family education;Taping;Dry needling;Manual techniques   PT Next Visit Plan check response to supine decompress ex's;  lumbar mechanical traction;  heat/IFC in supine          G-Codes - Sep 24, 2015 1418    Functional Assessment Tool Used FOTO clinical judgement    Functional Limitation Mobility: Walking and moving around   Mobility: Walking and Moving Around Current Status [phone]) At least 40 percent but less than 60 percent impaired, limited or restricted   Mobility: Walking and Moving Around Goal Status [phone]) At least 20 percent but less than 40 percent impaired, limited or restricted       Problem List Patient Active Problem List   Diagnosis Date Noted  . Chronic migraine without aura without status migrainosus, not intractable 08/04/2015  .  Fibromyalgia 07/26/2015  . MGUS (monoclonal gammopathy of unknown significance) 06/10/2015  . Cervical disc disease 03/02/2015  . Migraine headache 09/05/2012  . Fatigue 10/26/2011  . Pure hyperglyceridemia 04/27/2011  . Essential hypertension, benign 04/17/2011  . Unspecified vitamin D deficiency 04/17/2011  . Neuropathy ([place]) 12/22/2010  . Headache 12/22/2010    Alvera Singh 09/07/2015, 2:21 PM  [facility] [address], [place], [place], [address] Phone: [phone]    Fax:  [phone]  Name: Lindsey Rodriguez MRN: [id number] Date of Birth: Nov 03, 1965   Ruben Im, PT 09/07/2015 2:21 PM Phone: [phone] Fax: [phone]

## 2015-09-09 ENCOUNTER — Ambulatory Visit: Payer: Medicare Other | Admitting: Physical Therapy

## 2015-09-09 ENCOUNTER — Encounter: Payer: Self-pay | Admitting: Physical Therapy

## 2015-09-09 DIAGNOSIS — M2560 Stiffness of unspecified joint, not elsewhere classified: Secondary | ICD-10-CM

## 2015-09-09 DIAGNOSIS — M5416 Radiculopathy, lumbar region: Secondary | ICD-10-CM | POA: Diagnosis not present

## 2015-09-09 DIAGNOSIS — M6281 Muscle weakness (generalized): Secondary | ICD-10-CM

## 2015-09-09 DIAGNOSIS — M5441 Lumbago with sciatica, right side: Secondary | ICD-10-CM

## 2015-09-09 DIAGNOSIS — R293 Abnormal posture: Secondary | ICD-10-CM

## 2015-09-09 NOTE — Therapy (Signed)
[facility] [address] [place], [place], [address] Phone: [phone]   Fax:  [phone]  Physical Therapy Treatment  Patient Details  Name: Lindsey Rodriguez MRN: [id number] Date of Birth: 02-03-1966 Referring Provider: Dr. Tomi Likens  Encounter Date: 09/09/2015      PT End of Session - 09/09/15 1219    Visit Number 2   Number of Visits 10   Date for PT Re-Evaluation 11/02/15   Authorization Type UHC Medicare   PT Start Time [id number]   PT Stop Time 1245   PT Time Calculation (min) 47 min   Activity Tolerance Patient limited by pain   Behavior During Therapy --      Past Medical History  Diagnosis Date  . Anxiety panic attacks    [name]  . Hypertension   . Kidney stones h/o  . Chronic neck pain   . Chronic back pain   . Migraine   . Plantar fasciitis right  . Tendonitis of foot right    Past Surgical History  Procedure Laterality Date  . Neck surgery  [name]  Summer 2011    X4  . Back surgery  Dr.Stern, last sx Summer 2011    X4  . Tonsillectomy    . Rhinoplasty      There were no vitals filed for this visit.  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 10 min   How long can you walk comfortably? 15 min   Diagnostic tests No recent x-rays or MRI.   Patient Stated Goals alleviate the pain and strengthen muscles so I can move more   Currently in Pain? Yes   Pain Score 7    Pain Location  Back   Pain Orientation Right   Pain Descriptors / Indicators Stabbing   Pain Type Acute pain   Pain Onset More than a month ago   Pain Frequency Constant   Aggravating Factors  riding in the car, driving, sitting   Pain Relieving Factors flat on back with knees elevated, heat/ice with  no benefit   Multiple Pain Sites No                         [facility] Adult PT Treatment/Exercise - 09/09/15 0001    Bed Mobility   Bed Mobility --  practiced bedmobility with proper technique   Posture/Postural Control   Posture/Postural Control Postural limitations   Postural Limitations Decreased lumbar lordosis   Exercises   Exercises Lumbar   Lumbar Exercises: Supine   Other Supine Lumbar Exercises Supine decompression exercise head, shoulder and added leg lenthening difficult with Rt LE,   Other Supine Lumbar Exercises Butterfly prolonged hold x 3   Modalities   Modalities Electrical Stimulation;Ultrasound   Electrical Stimulation   Electrical Stimulation Location Rt gluteal area   Electrical Stimulation Action IFC   Electrical Stimulation Parameters 80-150Hz    Electrical Stimulation Goals Pain  Ultrasound   Ultrasound Location Rt piriformis arad sacral area   Ultrasound Parameters 50%, 1Mhz, 1.0W/cm   Ultrasound Goals Pain   Manual Therapy   Manual Therapy Passive ROM  to release tension and pain in  Rt gluteal area   Passive ROM Rt LE flex & diagonal                 PT Education - 09/09/15 1256    Education provided Yes   Education Details Butterfly   Person(s) Educated Patient   Methods Explanation;Handout   Comprehension Verbalized understanding;Returned demonstration          PT Short Term Goals - 09/07/15 1403    PT SHORT TERM GOAL #1   Title   The patient will report an understanding of basic self management, pain control strategies including use of lumbar roll when sitting, sitting limit, frequent change of position 10/05/15   Time 4   Period  Weeks   Status New   PT SHORT TERM GOAL #2   Title The patient will report centralization of symptoms 25% of the time   Time 4   Period Weeks   Status New   PT SHORT TERM GOAL #3   Title The patient will be able to stand/walk for 20 min with minimal increase in pain   Time 4   Period Weeks   Status New   PT SHORT TERM GOAL #4   Title The patient will report a 25% improvement in sitting tolerance for driving and riding in the car   Time 4   Period Weeks   Status New           PT Long Term Goals - 09/07/15 1412    PT LONG TERM GOAL #1   Title The patient will be independent in safe self progression of HEP for further ROM, strengthening and pain control  11/02/15   Time 8   Period Weeks   Status New   PT LONG TERM GOAL #2   Title Symptoms will be centralized 50% of the time with usual ADLs     Time 8   Period Weeks   Status New   PT LONG TERM GOAL #3   Title The patient will be able to drive or ride in the car for 30 minutes with minimal pain increase    Time 8   Period Weeks   Status New   PT LONG TERM GOAL #4   Title Core strength and right LE strength 4+/5 to 5-/5 needed for walking and standing longer periods of time   Time 8   Period Weeks   Status New   PT LONG TERM GOAL #5   Title FOTO functional outcome score improved from 50% to 37% indicating improved function with less pain   Time 8   Period Weeks   Status New               Plan - 09/09/15 1221    Clinical Impression Statement Pt very limited with activities due to pain. Pt was able to practice bodymechanics in/out of bed and decompression exercises, but leg lengthening seemed to trigger pain in Rt piriformis/gluteal area discontinued. Pt will continue to benefit from very gentle exercises   Pt will benefit from skilled therapeutic intervention in order to improve on the following deficits Decreased activity tolerance;Pain;Decreased strength;Decreased range of motion;Increased muscle spasms;Postural  dysfunction   Rehab Potential Good   Clinical Impairments Affecting Rehab Potential 4 back surgeries: fused  L2-S1, 4 cervical siurgeries: rods and screws cervical spine (last surgery 6years ago). Now symptoms in low back and Rt LE, since July 27, 2015.   PT Frequency 2x / week   PT Duration 8 weeks   PT Treatment/Interventions ADLs/Self Care Home Management;Cryotherapy;Electrical Stimulation;Moist Heat;Traction;Ultrasound;Therapeutic exercise;Neuromuscular re-education;Patient/family education;Taping;Dry needling;Manual techniques   PT Next Visit Plan Review decopression head, shoulder, and butterfly. Try leg press-decompression and see reaction to leg lenthening. May start flexion exercises for lumbar   Consulted and Agree with Plan of Care Patient        Problem List Patient Active Problem List   Diagnosis Date Noted  . Chronic migraine without aura without status migrainosus, not intractable 08/04/2015  . Fibromyalgia 07/26/2015  . MGUS (monoclonal gammopathy of unknown significance) 06/10/2015  . Cervical disc disease 03/02/2015  . Migraine headache 09/05/2012  . Fatigue 10/26/2011  . Pure hyperglyceridemia 04/27/2011  . Essential hypertension, benign 04/17/2011  . Unspecified vitamin D deficiency 04/17/2011  . Neuropathy ([place]) 12/22/2010  . Headache 12/22/2010    NAUMANN-HOUEGNIFIO,Lindsey Rodriguez PTA 09/09/2015, 3:27 PM  [facility] [address], [place], [place], [address] Phone: [phone]   Fax:  [phone]  Name: HATSUMI TRAUGHBER MRN: [id number] Date of Birth: [birth year]

## 2015-09-09 NOTE — Patient Instructions (Addendum)
Butterfly, Supine    Lie on back, feet together. Lower knees toward floor. Hold 20 seconds or as long as comfortable. Repeat _3 __ times per session. Do 2-3___ sessions per day.  Copyright  VHI. All rights reserved.

## 2015-09-13 ENCOUNTER — Ambulatory Visit: Payer: Medicare Other | Admitting: Physical Therapy

## 2015-09-13 ENCOUNTER — Encounter: Payer: Self-pay | Admitting: Physical Therapy

## 2015-09-13 DIAGNOSIS — M5441 Lumbago with sciatica, right side: Secondary | ICD-10-CM

## 2015-09-13 DIAGNOSIS — M5416 Radiculopathy, lumbar region: Secondary | ICD-10-CM

## 2015-09-13 DIAGNOSIS — R293 Abnormal posture: Secondary | ICD-10-CM

## 2015-09-13 DIAGNOSIS — M2560 Stiffness of unspecified joint, not elsewhere classified: Secondary | ICD-10-CM

## 2015-09-13 DIAGNOSIS — M6281 Muscle weakness (generalized): Secondary | ICD-10-CM

## 2015-09-13 NOTE — Patient Instructions (Addendum)
  HAMSTRING STRETCH WITH TOWEL  Or strap  While lying down on your back, hook a towel or strap under  your foot and draw up your leg until a stretch is felt under your leg. calf area.  Keep your knee in a straightened position during the stretch.  Hold 20 seconds, perform 3 repetitions.  3 times a day.    [facility] [address] [place], [place] [address] Phone # [phone] Fax [phone]  Piriformis (Supine)  Cross legs, right on top. Gently pull other knee toward chest until stretch is felt in buttock/hip of top leg. Hold ____ seconds. Repeat ____ times per set. Do ____ sets per session. Do ____ sessions per day.  Hip Stretch  Put right ankle over left knee. Let right knee fall downward, but keep ankle in place. Feel the stretch in hip. May push down gently with hand to feel stretch. Hold ____ seconds while counting out loud. Repeat with other leg. Repeat ____ times. Do ____ sessions per day.  Stretching: Piriformis   Cross left leg over other thigh and place elbow over outside of knee. Gently stretch buttock muscles by pushing bent knee across body. Hold ____ seconds. Repeat ____ times per set. Do ____ sets per session. Do ____ sessions per day.  Stretching: Piriformis (Supine)  Pull right knee toward opposite shoulder. Hold ____ seconds. Relax. Repeat ____ times per set. Do ____ sets per session. Do ____ sessions per day.  Copyright  VHI. All rights reserved.      Stretching: Inner Thigh / Groin    Lay on your back. Place heels together and pull feet toward groin until stretch is felt in groin and inner thigh. Hold ____ seconds. Repeat _3___ times per set. Do 1-2 ____ sets per session. Do _2 sessions per day.  http://orth.exer.[email or web address]   Copyright  VHI. All rights reserved.

## 2015-09-13 NOTE — Therapy (Signed)
[facility] [address] [place], [place], [address] Phone: [phone]   Fax:  [phone]  Physical Therapy Treatment  Patient Details  Name: Lindsey Rodriguez MRN: [id number] Date of Birth: [birth year] Referring Provider: Dr. Tomi Likens  Encounter Date: 09/13/2015      PT End of Session - 09/13/15 1232    Visit Number 3   Number of Visits 10   Date for PT Re-Evaluation 11/02/15   Authorization Type [employer] Medicare   PT Start Time [id number]   PT Stop Time 1244   PT Time Calculation (min) 59 min   Activity Tolerance Patient limited by pain      Past Medical History  Diagnosis Date  . Anxiety panic attacks    [name]  . Hypertension   . Kidney stones h/o  . Chronic neck pain   . Chronic back pain   . Migraine   . Plantar fasciitis right  . Tendonitis of foot right    Past Surgical History  Procedure Laterality Date  . Neck surgery  [name]  Summer 2011    X4  . Back surgery  Dr.Stern, last sx Summer 2011    X4  . Tonsillectomy    . Rhinoplasty      There were no vitals filed for this visit.  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 10 min   How long can you walk comfortably? 15 min   Diagnostic tests No recent x-rays or MRI.   Patient Stated Goals alleviate the pain and strengthen muscles so I can move more   Currently in Pain? Yes   Pain Score 6    Pain Location Back   Pain Orientation Right   Pain  Descriptors / Indicators Stabbing   Pain Type Acute pain   Pain Radiating Towards right LE buttock, post thight to mid calf   Pain Onset More than a month ago   Pain Frequency Constant   Aggravating Factors  riding in the car, driving, sitting   Pain Relieving Factors flat on back with knee's elevated, heat/ice with no benefit   Multiple Pain Sites No                         [facility] Adult PT Treatment/Exercise - 09/13/15 0001    Bed Mobility   Bed Mobility --  pt with good technique with sit to supine & reverse   Posture/Postural Control   Posture/Postural Control Postural limitations   Postural Limitations Decreased lumbar lordosis   Exercises   Exercises Lumbar   Lumbar Exercises: Stretches   Active Hamstring Stretch 3 reps;20 seconds  using strap, each leg   Single Knee to Chest Stretch 3 reps;20 seconds  each leg, attempted diagonal knee to chest but incr in pain   Lumbar Exercises: Supine   Other Supine Lumbar Exercises Supine decompression exercise head, shoulder   while on heating pad   Other Supine Lumbar Exercises Butterfly  prolonged hold x 3   Modalities   Modalities Electrical Stimulation;Ultrasound   Acupuncturist Location Rt gluteal area   Electrical Stimulation Action IFC   Electrical Stimulation Parameters to pt. tolerance   Electrical Stimulation Goals Pain   Ultrasound   Ultrasound Location Rt piriformis in left sidelying   Ultrasound Parameters 50%, 1 MHz, ! W/cm   Ultrasound Goals Pain   Manual Therapy   Manual Therapy --   Passive ROM --                PT Education - 09/13/15 1203    Education provided Yes   Education Details hamstring stretch, piriformis variations, butterfly   Person(s) Educated Patient   Methods Explanation;Handout   Comprehension Verbalized understanding;Returned demonstration          PT Short Term Goals - 09/13/15 1236    PT SHORT TERM GOAL #1   Title   The  patient will report an understanding of basic self management, pain control strategies including use of lumbar roll when sitting, sitting limit, frequent change of position 10/05/15   Time 4   Period Weeks   Status On-going   PT SHORT TERM GOAL #2   Title The patient will report centralization of symptoms 25% of the time   Time 4   Period Weeks   Status On-going   PT SHORT TERM GOAL #3   Title The patient will be able to stand/walk for 20 min with minimal increase in pain   Time 4   Period Weeks   Status On-going   PT SHORT TERM GOAL #4   Title The patient will report a 25% improvement in sitting tolerance for driving and riding in the car   Time 4   Period Weeks   Status On-going           PT Long Term Goals - 09/07/15 1412    PT LONG TERM GOAL #1   Title The patient will be independent in safe self progression of HEP for further ROM, strengthening and pain control  11/02/15   Time 8   Period Weeks   Status New   PT LONG TERM GOAL #2   Title Symptoms will be centralized 50% of the time with usual ADLs     Time 8   Period Weeks   Status New   PT LONG TERM GOAL #3   Title The patient will be able to drive or ride in the car for 30 minutes with minimal pain increase    Time 8   Period Weeks   Status New   PT LONG TERM GOAL #4   Title Core strength and right LE strength 4+/5 to 5-/5 needed for walking and standing longer periods of time   Time 8   Period Weeks   Status New   PT LONG TERM GOAL #5   Title FOTO functional outcome score improved from 50% to 37% indicating improved function with less pain   Time 8   Period Weeks   Status New               Plan - 09/13/15 1233    Clinical Impression Statement Pt with incr of pain with stretching of Rt gluteal area. Pt presents with good bodymechanics with transfers sit to supine. Pt will continue to benefit from very gentle exercises   Pt will benefit from skilled therapeutic intervention in order to improve on the  following deficits Decreased activity  tolerance;Pain;Decreased strength;Decreased range of motion;Increased muscle spasms;Postural dysfunction   Rehab Potential Good   Clinical Impairments Affecting Rehab Potential 4 back surgeries: fused L2-S1, 4 cervical siurgeries: rods and screws cervical spine (last surgery 6years ago). Now symptoms in low back and Rt LE, since July 27, 2015.   PT Frequency 2x / week   PT Duration 8 weeks   PT Treatment/Interventions ADLs/Self Care Home Management;Cryotherapy;Electrical Stimulation;Moist Heat;Traction;Ultrasound;Therapeutic exercise;Neuromuscular re-education;Patient/family education;Taping;Dry needling;Manual techniques   PT Next Visit Plan Continue to review decompression, butterfly, SKC, HS strech in supine. Try leg-press decompression and see reaction to leg lengthening   Consulted and Agree with Plan of Care Patient        Problem List Patient Active Problem List   Diagnosis Date Noted  . Chronic migraine without aura without status migrainosus, not intractable 08/04/2015  . Fibromyalgia 07/26/2015  . MGUS (monoclonal gammopathy of unknown significance) 06/10/2015  . Cervical disc disease 03/02/2015  . Migraine headache 09/05/2012  . Fatigue 10/26/2011  . Pure hyperglyceridemia 04/27/2011  . Essential hypertension, benign 04/17/2011  . Unspecified vitamin D deficiency 04/17/2011  . Neuropathy ([place]) 12/22/2010  . Headache 12/22/2010    NAUMANN-HOUEGNIFIO,Lindsey Rodriguez PTA 09/13/2015, 12:40 PM  [facility] [address] [place], [place], [address] Phone: [phone]   Fax:  [phone]  Name: KRISTIN DAISEY MRN: [id number] Date of Birth: 1965-09-05

## 2015-09-16 ENCOUNTER — Encounter: Payer: Self-pay | Admitting: Physical Therapy

## 2015-09-16 ENCOUNTER — Ambulatory Visit: Payer: Medicare Other | Admitting: Physical Therapy

## 2015-09-16 DIAGNOSIS — M5416 Radiculopathy, lumbar region: Secondary | ICD-10-CM | POA: Diagnosis not present

## 2015-09-16 DIAGNOSIS — R293 Abnormal posture: Secondary | ICD-10-CM

## 2015-09-16 DIAGNOSIS — M2560 Stiffness of unspecified joint, not elsewhere classified: Secondary | ICD-10-CM

## 2015-09-16 DIAGNOSIS — M5441 Lumbago with sciatica, right side: Secondary | ICD-10-CM

## 2015-09-16 NOTE — Therapy (Signed)
[facility] [address] [place], [place], [address] Phone: [phone]   Fax:  [phone]  Physical Therapy Treatment  Patient Details  Name: Lindsey Rodriguez MRN: [id number] Date of Birth: [birth year] Referring Provider: Dr. Tomi Likens  Encounter Date: 09/16/2015      PT End of Session - 09/16/15 1401    Visit Number 4   Number of Visits 10   Date for PT Re-Evaluation 11/02/15   Authorization Type UHC Medicare   PT Start Time 1402   PT Stop Time 1459   PT Time Calculation (min) 57 min   Activity Tolerance Patient limited by pain   Behavior During Therapy [facility] for tasks assessed/performed      Past Medical History  Diagnosis Date  . Anxiety panic attacks    [name]  . Hypertension   . Kidney stones h/o  . Chronic neck pain   . Chronic back pain   . Migraine   . Plantar fasciitis right  . Tendonitis of foot right    Past Surgical History  Procedure Laterality Date  . Neck surgery  [name]  Summer 2011    X4  . Back surgery  Dr.Stern, last sx Summer 2011    X4  . Tonsillectomy    . Rhinoplasty      There were no vitals filed for this visit.  Visit Diagnosis:  Lumbar radicular pain  Right-sided low back pain with right-sided sciatica  Posture abnormality  Joint stiffness      Subjective Assessment - 09/16/15 1414    Subjective Pt reports since yesterday the pain is now also in the Lt gluteal area in addition to Rt gluteal area. 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 10 min   How long can you walk comfortably? 15 min   Diagnostic tests No recent x-rays or MRI.   Patient Stated Goals alleviate the pain and strengthen muscles so I can  move more   Currently in Pain? Yes   Pain Score 6    Pain Location Back   Pain Orientation Right;Left   Pain Descriptors / Indicators Stabbing;Aching  stabbing on Rt side, aching on Lt    Pain Type Acute pain   Pain Onset More than a month ago   Pain Frequency Constant   Aggravating Factors  riding in the car, driving, sitting   Pain Relieving Factors flat on back with knee's elevated, heat/ice with no benefit   Multiple Pain Sites No                         [facility] Adult PT Treatment/Exercise - 09/16/15 0001    Posture/Postural Control   Posture/Postural Control Postural limitations   Postural Limitations Decreased lumbar lordosis   Exercises   Exercises Lumbar   Lumbar Exercises: Supine   Other Supine Lumbar Exercises Supine decompression exercise head, shoulder   while on heating pad   Other Supine Lumbar Exercises TA activation with bil & unilateral leg ER, marching difficult advised to hold on if expierience pain at home, leg lengthening left, Rt leg slighly discomfort in Rt gluteal area    Modalities   Modalities Electrical Stimulation;Ultrasound   Acupuncturist Location  Rt gluteal area   Electrical Stimulation Action IFC   Electrical Stimulation Parameters tp pt tolerance   Electrical Stimulation Goals Pain   Ultrasound   Ultrasound Location Rt piriformis   Ultrasound Parameters 50%, 1MHz, 1W/cm x 67min   Ultrasound Goals Pain                PT Education - 09/16/15 1442    Education Details TA activation    Person(s) Educated Patient   Methods Explanation;Handout   Comprehension Verbalized understanding;Returned demonstration          PT Short Term Goals - 09/13/15 1236    PT SHORT TERM GOAL #1   Title   The patient will report an understanding of basic self management, pain control strategies including use of lumbar roll when sitting, sitting limit, frequent change of position 10/05/15   Time 4   Period  Weeks   Status On-going   PT SHORT TERM GOAL #2   Title The patient will report centralization of symptoms 25% of the time   Time 4   Period Weeks   Status On-going   PT SHORT TERM GOAL #3   Title The patient will be able to stand/walk for 20 min with minimal increase in pain   Time 4   Period Weeks   Status On-going   PT SHORT TERM GOAL #4   Title The patient will report a 25% improvement in sitting tolerance for driving and riding in the car   Time 4   Period Weeks   Status On-going           PT Long Term Goals - 09/07/15 1412    PT LONG TERM GOAL #1   Title The patient will be independent in safe self progression of HEP for further ROM, strengthening and pain control  11/02/15   Time 8   Period Weeks   Status New   PT LONG TERM GOAL #2   Title Symptoms will be centralized 50% of the time with usual ADLs     Time 8   Period Weeks   Status New   PT LONG TERM GOAL #3   Title The patient will be able to drive or ride in the car for 30 minutes with minimal pain increase    Time 8   Period Weeks   Status New   PT LONG TERM GOAL #4   Title Core strength and right LE strength 4+/5 to 5-/5 needed for walking and standing longer periods of time   Time 8   Period Weeks   Status New   PT LONG TERM GOAL #5   Title FOTO functional outcome score improved from 50% to 37% indicating improved function with less pain   Time 8   Period Weeks   Status New               Plan - 09/16/15 1404    Clinical Impression Statement Pt continues to expierience pain in Rt gluteal area with stretching. Pt will benefit from skilled PT to advance to pt's tolerance, to improve daily life activities as driving in the car and household work.     Pt will benefit from skilled therapeutic intervention in order to improve on the following deficits Decreased activity tolerance;Pain;Decreased strength;Decreased range of motion;Increased muscle spasms;Postural dysfunction   Rehab Potential Good    Clinical Impairments Affecting Rehab Potential 4 back surgeries: fused L2-S1, 4 cervical surgeries: rods and screws cervical spine (last surgery 6years ago). Now  symptoms in low back and Rt LE, since July 27, 2015.   PT Frequency 2x / week   PT Duration 8 weeks   PT Treatment/Interventions ADLs/Self Care Home Management;Cryotherapy;Electrical Stimulation;Moist Heat;Traction;Ultrasound;Therapeutic exercise;Neuromuscular re-education;Patient/family education;Taping;Dry needling;Manual techniques   PT Next Visit Plan Review TA activation   Consulted and Agree with Plan of Care Patient        Problem List Patient Active Problem List   Diagnosis Date Noted  . Chronic migraine without aura without status migrainosus, not intractable 08/04/2015  . Fibromyalgia 07/26/2015  . MGUS (monoclonal gammopathy of unknown significance) 06/10/2015  . Cervical disc disease 03/02/2015  . Migraine headache 09/05/2012  . Fatigue 10/26/2011  . Pure hyperglyceridemia 04/27/2011  . Essential hypertension, benign 04/17/2011  . Unspecified vitamin D deficiency 04/17/2011  . Neuropathy ([place]) 12/22/2010  . Headache 12/22/2010    NAUMANN-HOUEGNIFIO,Valen Mascaro PTA 09/16/2015, 2:50 PM  [facility] [address], [place], [place], [address] Phone: [phone]   Fax:  [phone]  Name: ALAYCIA KOKER MRN: [id number] Date of Birth: 06-06-1966

## 2015-09-16 NOTE — Patient Instructions (Addendum)

## 2015-09-21 ENCOUNTER — Ambulatory Visit: Payer: Medicare Other | Admitting: Physical Therapy

## 2015-09-21 ENCOUNTER — Encounter: Payer: Self-pay | Admitting: Neurology

## 2015-09-21 ENCOUNTER — Encounter: Payer: Self-pay | Admitting: Physical Therapy

## 2015-09-21 DIAGNOSIS — M5416 Radiculopathy, lumbar region: Secondary | ICD-10-CM

## 2015-09-21 DIAGNOSIS — M6281 Muscle weakness (generalized): Secondary | ICD-10-CM

## 2015-09-21 DIAGNOSIS — R293 Abnormal posture: Secondary | ICD-10-CM

## 2015-09-21 DIAGNOSIS — M2560 Stiffness of unspecified joint, not elsewhere classified: Secondary | ICD-10-CM

## 2015-09-21 DIAGNOSIS — M5441 Lumbago with sciatica, right side: Secondary | ICD-10-CM

## 2015-09-21 NOTE — Therapy (Addendum)
[facility] [address] [place], [place], [address] Phone: [phone]   Fax:  [phone]  Physical Therapy Treatment/Discharge Summary  Patient Details  Name: Lindsey Rodriguez MRN: [id number] Date of Birth: 10-10-1965 Referring Provider: Dr. Tomi Likens  Encounter Date: 09/21/2015      PT End of Session - 09/21/15 1121    Visit Number 5   Number of Visits 10   Date for PT Re-Evaluation 11/02/15   Authorization Type [employer]HC Medicare   PT Start Time 1102   PT Stop Time 1145   PT Time Calculation (min) 43 min   Activity Tolerance Patient limited by pain   Behavior During Therapy [facility] for tasks assessed/performed      Past Medical History  Diagnosis Date  . Anxiety panic attacks    [name]  . Hypertension   . Kidney stones h/o  . Chronic neck pain   . Chronic back pain   . Migraine   . Plantar fasciitis right  . Tendonitis of foot right    Past Surgical History  Procedure Laterality Date  . Neck surgery  [name]  Summer 2011    X4  . Back surgery  Dr.Stern, last sx Summer 2011    X4  . Tonsillectomy    . Rhinoplasty      There were no vitals filed for this visit.  Visit Diagnosis:  Lumbar radicular pain  Right-sided low back pain with right-sided sciatica  Posture abnormality  Joint stiffness  Muscle weakness      Subjective Assessment - 09/21/15 1114    Subjective Pt rates her pain as 7/10 radiating into Rt LE. Pt advised to contact MD for re-evaluation. Pt with some relieve after PT session.   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 10 min   How long can you walk comfortably? 15 min   Diagnostic tests No recent x-rays or MRI.   Patient Stated Goals alleviate  the pain and strengthen muscles so I can move more   Currently in Pain? Yes   Pain Score 7    Pain Location Back   Pain Orientation Right;Left   Pain Descriptors / Indicators Aching;Stabbing   Pain Type Acute pain   Pain Onset More than a month ago   Pain Frequency Constant   Aggravating Factors  riding in the car, driving, sitting   Pain Relieving Factors flat on back with knee's elevated, head/ice with some benefits at times   Multiple Pain Sites No                         [facility] Adult PT Treatment/Exercise - 09/21/15 0001    Posture/Postural Control   Posture/Postural Control Postural limitations   Postural Limitations Decreased lumbar lordosis   Exercises   Exercises Lumbar   Lumbar Exercises: Supine   Other Supine Lumbar Exercises Supine decompression exercise head, shoulder   yellow t-band bil abduction, D 2 x 10 each     Other Supine Lumbar Exercises TA activation no leg movement today x 10 with 5 sec hold  while on heating pad   Modalities   Modalities Electrical Stimulation;Ultrasound;Moist Heat   Moist Heat Therapy   Number Minutes Moist Heat 15 Minutes   Moist Heat Location Lumbar  Spine  during exercises and during e-stime   Electrical Stimulation   Electrical Stimulation Location Rt gluteal area   Electrical Stimulation Action IFC   Electrical Stimulation Parameters to pt tolerance   Electrical Stimulation Goals Pain   Ultrasound   Ultrasound Location today Left Piriformis    Ultrasound Parameters 50%, 1 Mhz, ! W/cm x 30mn   Ultrasound Goals Pain                  PT Short Term Goals - 09/21/15 1124    PT SHORT TERM GOAL #1   Title   The patient will report an understanding of basic self management, pain control strategies including use of lumbar roll when sitting, sitting limit, frequent change of position 10/05/15   Time 4   Period Weeks   Status On-going   PT SHORT TERM GOAL #2   Title The patient will report centralization of  symptoms 25% of the time   Time 4   Period Weeks   Status On-going   PT SHORT TERM GOAL #3   Title The patient will be able to stand/walk for 20 min with minimal increase in pain   Time 4   Period Weeks   Status On-going   PT SHORT TERM GOAL #4   Title The patient will report a 25% improvement in sitting tolerance for driving and riding in the car   Time 4   Period Weeks   Status On-going           PT Long Term Goals - 09/07/15 1412    PT LONG TERM GOAL #1   Title The patient will be independent in safe self progression of HEP for further ROM, strengthening and pain control  11/02/15   Time 8   Period Weeks   Status New   PT LONG TERM GOAL #2   Title Symptoms will be centralized 50% of the time with usual ADLs     Time 8   Period Weeks   Status New   PT LONG TERM GOAL #3   Title The patient will be able to drive or ride in the car for 30 minutes with minimal pain increase    Time 8   Period Weeks   Status New   PT LONG TERM GOAL #4   Title Core strength and right LE strength 4+/5 to 5-/5 needed for walking and standing longer periods of time   Time 8   Period Weeks   Status New   PT LONG TERM GOAL #5   Title FOTO functional outcome score improved from 50% to 37% indicating improved function with less pain   Time 8   Period Weeks   Status New               Plan - 09/21/15 1121    Clinical Impression Statement Patient with pain in Rt > Lt gluteal area and radiating pain into Rt LE. Pt will benefit from skilled PT to advance to pt's tolerance, to improve daily life activities as driving in the car and household chores.    Pt will benefit from skilled therapeutic intervention in order to improve on the following deficits Decreased activity tolerance;Pain;Decreased strength;Decreased range of motion;Increased muscle spasms;Postural dysfunction   Clinical Impairments Affecting Rehab Potential 4 back surgeries: fused L2-S1, 4 cervical surgeries: rods and screws  cervical spine (last surgery 6years ago). Now symptoms in low back and Rt LE, since July 27, 2015.   PT Frequency 2x /  week   PT Duration 8 weeks   PT Treatment/Interventions ADLs/Self Care Home Management;Cryotherapy;Electrical Stimulation;Moist Heat;Traction;Ultrasound;Therapeutic exercise;Neuromuscular re-education;Patient/family education;Taping;Dry needling;Manual techniques   PT Next Visit Plan depending on pain level, gentle stretching and strengthening, [place], modalities   Consulted and Agree with Plan of Care Patient     G code:  Walking and moving around  Goal CJ,  Current and discharge CK     PHYSICAL THERAPY DISCHARGE SUMMARY  Visits from Start of Care: 5  Current functional level related to goals / functional outcomes: The patient called to report she had an MRI which showed L5 irritated nerve root and requested to hold PT.  Her chart has been inactive for > 6 weeks, will discharge from PT at this time.     Remaining deficits: No progress toward goals   Education / Equipment: Basic self care strategies Plan: Patient agrees to discharge.  Patient goals were not met. Patient is being discharged due to the patient's request.  ?????        Problem List Patient Active Problem List   Diagnosis Date Noted  . Chronic migraine without aura without status migrainosus, not intractable 08/04/2015  . Fibromyalgia 07/26/2015  . MGUS (monoclonal gammopathy of unknown significance) 06/10/2015  . Cervical disc disease 03/02/2015  . Migraine headache 09/05/2012  . Fatigue 10/26/2011  . Pure hyperglyceridemia 04/27/2011  . Essential hypertension, benign 04/17/2011  . Unspecified vitamin D deficiency 04/17/2011  . Neuropathy ([place]) 12/22/2010  . Headache 12/22/2010    Ruben Im, PT 11/02/2015 8:13 AM Phone: [phone] Fax: [phone]  NAUMANN-HOUEGNIFIO,Javanni Maring PTA 09/21/2015, 3:46 PM  [facility] [address], [place], [place], [address] Phone: [phone]   Fax:  [phone]  Name: Lindsey Rodriguez MRN: [id number] Date of Birth: 1965/09/20

## 2015-09-22 ENCOUNTER — Encounter: Payer: Self-pay | Admitting: *Deleted

## 2015-09-22 ENCOUNTER — Other Ambulatory Visit: Payer: Self-pay | Admitting: *Deleted

## 2015-09-22 ENCOUNTER — Encounter: Payer: Self-pay | Admitting: Family Medicine

## 2015-09-22 DIAGNOSIS — M5410 Radiculopathy, site unspecified: Secondary | ICD-10-CM

## 2015-09-22 DIAGNOSIS — M5417 Radiculopathy, lumbosacral region: Secondary | ICD-10-CM

## 2015-09-22 DIAGNOSIS — M5416 Radiculopathy, lumbar region: Secondary | ICD-10-CM

## 2015-09-23 ENCOUNTER — Encounter: Payer: Medicare Other | Admitting: Physical Therapy

## 2015-09-26 ENCOUNTER — Ambulatory Visit
Admission: RE | Admit: 2015-09-26 | Discharge: 2015-09-26 | Disposition: A | Discharge status: Home / Self Care | Payer: Medicare Other | Source: Ambulatory Visit | Attending: Neurology | Admitting: Neurology

## 2015-09-26 DIAGNOSIS — M5416 Radiculopathy, lumbar region: Secondary | ICD-10-CM

## 2015-09-26 DIAGNOSIS — M5410 Radiculopathy, site unspecified: Secondary | ICD-10-CM

## 2015-09-26 DIAGNOSIS — M5417 Radiculopathy, lumbosacral region: Secondary | ICD-10-CM

## 2015-09-27 ENCOUNTER — Telehealth: Payer: Self-pay

## 2015-09-27 DIAGNOSIS — M5417 Radiculopathy, lumbosacral region: Secondary | ICD-10-CM

## 2015-09-27 NOTE — Telephone Encounter (Signed)
-----   Message from Pieter Partridge, DO sent at 09/27/2015  7:03 AM EDT ----- MRI shows some new arthritic changes which may potentially affect nerves, but not at the levels I expected.  I would like to get a NCV-EMG of the right lower extremity to evaluate for any active nerve irritation to help localize where the pain is coming from.

## 2015-09-27 NOTE — Telephone Encounter (Signed)
Message relayed to patient. Verbalized understanding and denied questions. EMG order placed. Message sent to scheduling staff.

## 2015-09-28 ENCOUNTER — Ambulatory Visit (INDEPENDENT_AMBULATORY_CARE_PROVIDER_SITE_OTHER): Payer: Medicare Other | Admitting: Neurology

## 2015-09-28 ENCOUNTER — Other Ambulatory Visit: Payer: Self-pay | Admitting: *Deleted

## 2015-09-28 DIAGNOSIS — R200 Anesthesia of skin: Secondary | ICD-10-CM

## 2015-09-28 DIAGNOSIS — M5417 Radiculopathy, lumbosacral region: Secondary | ICD-10-CM

## 2015-09-28 NOTE — Procedures (Signed)
[facility]  [address]  [place], [place] [address] Tel: [phone] Fax:  [phone] Test Date:  09/28/2015  Patient: Lindsey Rodriguez DOB: 12/28/1965 Physician: Narda Amber, DO  Sex: Female Height: 5\' 6"  Ref Phys: Metta Clines, M.D.  ID#: [id number] Temp: 34.4C Technician: Jerilynn Mages. Dean   Patient Complaints: This is a 50 year-old female referred for evaluation of right-sided low back pain radiating down her posterior thigh and into her leg and foot.  NCV & EMG Findings: Extensive electrodiagnostic testing of the right lower extremity shows: 1. Right sural and superficial peroneal sensory responses are within normal limits. 2. Right tibial and peroneal motor responses are within normal limits. 3. Right tibial H reflex studies within normal limits. 4. Chronic motor axon loss changes are seen affecting the L5 myotome on the right, without accompanied active denervation.  Impression: 1. Chronic L5 radiculopathy, mild in degree electrically. 2. There is no evidence of a generalized sensorimotor polyneuropathy affecting the lower extremity.    ___________________________ Narda Amber, DO    Nerve Conduction Studies Anti Sensory Summary Table   Stim Site NR Peak (ms) Norm Peak (ms) P-T Amp (V) Norm P-T Amp  Right Sup Peroneal Anti Sensory (Ant Lat Mall)  34.4C  12 cm    4.0 <4.6 5.9 >4  Right Sural Anti Sensory (Lat Mall)  34.4C  Calf    3.9 <4.6 4.6 >4   Motor Summary Table   Stim Site NR Onset (ms) Norm Onset (ms) O-P Amp (mV) Norm O-P Amp Site1 Site2 Delta-0 (ms) Dist (cm) Vel (m/s) Norm Vel (m/s)  Right Peroneal Motor (Ext Dig Brev)  34.4C  Ankle    3.6 <6.0 3.0 >2.5 B Fib Ankle 7.8 33.0 42 >40  B Fib    11.4  2.7  Poplt B Fib 2.2 10.0 45 >40  Poplt    13.6  2.5         Right Tibial Motor (Abd Hall Brev)  34.4C  Ankle    3.4 <6.0 7.5 >4 Knee Ankle 9.6 40.0 42 >40  Knee    13.0  4.3          H Reflex Studies   NR H-Lat (ms) Lat Norm (ms)  L-R H-Lat (ms) M-Lat (ms) HLat-MLat (ms)  Right Tibial (Gastroc)  34.4C     34.01 <35  4.76 29.25   EMG   Side Muscle Ins Act Fibs Psw Fasc Number Recrt Dur Dur. Amp Amp. Poly Poly. Comment  Right AntTibialis Nml Nml Nml Nml 1- Rapid Some 1+ Some 1+ Nml Nml N/A  Right Gastroc Nml Nml Nml Nml Nml Nml Nml Nml Nml Nml Nml Nml N/A  Right Flex Dig Long Nml Nml Nml Nml 1- Mod-R Some 1+ Some 1+ Nml Nml N/A  Right RectFemoris Nml Nml Nml Nml Nml Nml Nml Nml Nml Nml Nml Nml N/A  Right GluteusMed Nml Nml Nml Nml 1- Mod-R Some 1+ Nml Nml Nml Nml N/A  Right BicepsFemS Nml Nml Nml Nml Nml Nml Nml Nml Nml Nml Nml Nml N/A      Waveforms:

## 2015-09-29 ENCOUNTER — Telehealth: Payer: Self-pay

## 2015-09-29 DIAGNOSIS — M5417 Radiculopathy, lumbosacral region: Secondary | ICD-10-CM

## 2015-09-29 NOTE — Telephone Encounter (Signed)
-----   Message from Pieter Partridge, DO sent at 09/29/2015  7:05 AM EDT ----- EMG shows old changes from old pinched nerve, but nothing active.  I think she just as aggravation of the old nerve injury.  I would like to refer her to interventional pain specialist as she may benefit from an epidural at that L5 nerve root.

## 2015-09-30 ENCOUNTER — Encounter: Payer: Medicare Other | Admitting: Physical Therapy

## 2015-10-04 ENCOUNTER — Ambulatory Visit: Payer: Medicare Other | Admitting: Physical Therapy

## 2015-10-04 ENCOUNTER — Telehealth: Payer: Self-pay

## 2015-10-04 NOTE — Telephone Encounter (Signed)
Pt scheduled for 10/28/15 @ 11:00 am w/ [facility]

## 2015-10-06 ENCOUNTER — Encounter: Payer: Medicare Other | Admitting: Physical Therapy

## 2015-10-19 ENCOUNTER — Telehealth: Payer: Self-pay | Admitting: Family Medicine

## 2015-10-19 DIAGNOSIS — G629 Polyneuropathy, unspecified: Secondary | ICD-10-CM

## 2015-10-19 NOTE — Telephone Encounter (Signed)
Reordered Pt Assistance Lyrica

## 2015-10-21 ENCOUNTER — Other Ambulatory Visit: Payer: Self-pay | Admitting: Family Medicine

## 2015-11-01 LAB — HM PAP SMEAR: HM Pap smear: NORMAL

## 2015-11-05 ENCOUNTER — Institutional Professional Consult (permissible substitution): Payer: Medicare Other | Admitting: Family Medicine

## 2015-11-10 ENCOUNTER — Other Ambulatory Visit: Payer: Self-pay | Admitting: Obstetrics and Gynecology

## 2015-11-10 ENCOUNTER — Encounter: Payer: Self-pay | Admitting: Family Medicine

## 2015-11-10 ENCOUNTER — Ambulatory Visit (INDEPENDENT_AMBULATORY_CARE_PROVIDER_SITE_OTHER): Payer: Medicare Other | Admitting: Family Medicine

## 2015-11-10 VITALS — BP 126/72 | HR 84 | Ht 66.25 in | Wt 172.0 lb

## 2015-11-10 DIAGNOSIS — N938 Other specified abnormal uterine and vaginal bleeding: Secondary | ICD-10-CM

## 2015-11-10 DIAGNOSIS — D472 Monoclonal gammopathy: Secondary | ICD-10-CM

## 2015-11-10 DIAGNOSIS — E781 Pure hyperglyceridemia: Secondary | ICD-10-CM | POA: Diagnosis not present

## 2015-11-10 DIAGNOSIS — M5090 Cervical disc disorder, unspecified, unspecified cervical region: Secondary | ICD-10-CM | POA: Diagnosis not present

## 2015-11-10 DIAGNOSIS — I10 Essential (primary) hypertension: Secondary | ICD-10-CM

## 2015-11-10 DIAGNOSIS — G629 Polyneuropathy, unspecified: Secondary | ICD-10-CM | POA: Diagnosis not present

## 2015-11-10 DIAGNOSIS — G43709 Chronic migraine without aura, not intractable, without status migrainosus: Secondary | ICD-10-CM | POA: Diagnosis not present

## 2015-11-10 DIAGNOSIS — R102 Pelvic and perineal pain: Secondary | ICD-10-CM | POA: Diagnosis not present

## 2015-11-10 NOTE — Progress Notes (Signed)
Chief Complaint  Patient presents with  . Medical Clearance    Dr.Horvath requesting medical clearance for hysterectomy that is scheduled for 11/24/15. Does not have a form, letter will do.    She is scheduled to have robotic, laparoscopic hysterectomy and anterior and posterior repair on 5/24. Unsure if ovaries will remain or not, likely depends on appearance at surgery. She has pre-op appt scheduled with Dr. Philis Pique on 5/17 to discuss prep and details.  She has pre-op visit at [facility] hospital on Monday 5/15.   Symptoms include severe pelvic cramping/pain.  She has been having breakthrough bleeding, even on the [place]. Currently bleeding, passing clots.  Most recent surgeries include: Melanoma excision 05/2015 (local anesthesia) Otherwise last surgery was 2011 (neck/back).  MGUS--periodic monitoring, stable. F/u labs due in June.  Headaches/migraines--improved.  Dr. Tomi Likens increased Lyrica, working well, also helping with the neuropathy.  Will run out early re: patient assistance through [employer].  Needs to be done again for the higher dose.  Dr. Tomi Likens sent her to pain management with Dr. Andree Elk; scheduled for injection 5/15. EMG showed chronic L5 radiculopathy.  Pain is down the back of her right leg (and front of shin).   PMH, PSH, SH, problem list and family history was reviewed in detail.  Outpatient Encounter Prescriptions as of 11/10/2015  Medication Sig Note  . amLODipine-benazepril (LOTREL) 5-10 MG capsule Take 1 capsule by mouth daily.   . B Complex Vitamins (B-COMPLEX/B-12 PO) Take 1 tablet by mouth daily.    Marland Kitchen buPROPion (WELLBUTRIN XL) 150 MG 24 hr tablet Take 150 mg by mouth daily.    . Calcium Carb-Cholecalciferol (CALCIUM PLUS VITAMIN D3) 600-800 MG-UNIT TABS Take 1 tablet by mouth daily.   . Cholecalciferol (VITAMIN D) 2000 units tablet Take 2,000 Units by mouth daily.   . divalproex (DEPAKOTE ER) 500 MG 24 hr tablet Take 1 tablet (500 mg total) by mouth daily. 11/10/2015: From  Dr. Tomi Likens for headaches  . fenofibrate 160 MG tablet TAKE 1 TABLET (160 MG TOTAL) BY MOUTH DAILY.   . fexofenadine (ALLEGRA) 180 MG tablet Take 180 mg by mouth daily.   Marland Kitchen FLUoxetine (PROZAC) 40 MG capsule Take 40 mg by mouth daily.    Marland Kitchen JINTELI 1-5 MG-MCG TABS Take 1 tablet by mouth daily. 03/10/2013: Dr. Philis Pique  . LORazepam (ATIVAN) 0.5 MG tablet Take 1-2 tablets by mouth 3 (three) times daily as needed. Prn anxiety 07/26/2015: Uses prn, just 1-2 times/week  . MAGNESIUM MALATE PO Take 1,250 mg by mouth 2 (two) times daily.   . Multiple Vitamins-Minerals (MULTIVITAMIN WITH MINERALS) tablet Take 1 tablet by mouth daily.     . Omega-3 Fatty Acids (FISH OIL PO) Take 1 capsule by mouth daily.    . pregabalin (LYRICA) 75 MG capsule Take 1 capsule (75 mg total) by mouth 2 (two) times daily. (Patient taking differently: Take 150 mg by mouth 2 (two) times daily. ) 11/10/2015: Dr.Jaffe upped to 150mg  BID  . Probiotic Product (TRUBIOTICS PO) Take 1 capsule by mouth daily.   . Pumpkin Seed-Soy Germ (AZO BLADDER CONTROL/GO-LESS PO) Take 1 capsule by mouth daily.   Marland Kitchen tiZANidine (ZANAFLEX) 2 MG tablet Take 2 mg by mouth every 6 (six) hours as needed for muscle spasms. 11/10/2015: Uses once or twice a week (neck and back pain)  . baclofen (LIORESAL) 10 MG tablet Take 10 mg by mouth 2 (two) times daily as needed for muscle spasms. Reported on 11/10/2015   . HYDROcodone-acetaminophen (NORCO/VICODIN) 5-325 MG tablet  Take 1 tablet by mouth every 6 (six) hours as needed for moderate pain. Reported on 11/10/2015   . [DISCONTINUED] albuterol (PROVENTIL HFA;VENTOLIN HFA) 108 (90 Base) MCG/ACT inhaler Inhale 2 puffs into the lungs every 6 (six) hours as needed for wheezing or shortness of breath. (Patient not taking: Reported on 11/09/2015)   . [DISCONTINUED] azithromycin (ZITHROMAX Z-PAK) 250 MG tablet Take 2 tablets on day 1, then 1 tablet on days 2 through 5. (Patient not taking: Reported on 11/09/2015)   . [DISCONTINUED]  gabapentin (NEURONTIN) 100 MG capsule Take 1 capsule (100 mg total) by mouth 3 (three) times daily. (Patient not taking: Reported on 11/09/2015)   . [DISCONTINUED] tiZANidine (ZANAFLEX) 4 MG tablet Take 1 tablet (4 mg total) by mouth every 6 (six) hours as needed for muscle spasms. (Patient not taking: Reported on 11/09/2015)    No facility-administered encounter medications on file as of 11/10/2015.   Allergies  Allergen Reactions  . Penicillins Shortness Of Breath and Rash    Has patient had a PCN reaction causing immediate rash, facial/tongue/throat swelling, SOB or lightheadedness with hypotension: no Has patient had a PCN reaction causing severe rash involving mucus membranes or skin necrosis: no Has patient had a PCN reaction that required hospitalization no Has patient had a PCN reaction occurring within the last 10 years: no If all of the above answers are "NO", then may proceed with Cephalosporin use.   . Codeine Other (See Comments)    Manic depressive  . Voltaren [Diclofenac] Nausea Only and Other (See Comments)    Pt stated "tore stomach up"  . Erythromycin Rash   ROS:  Denies chest pain, palpitations, shortness of breath.  Takes brother-in-law to PT--does toe raises when he is on the bike, does lunges while he is on other machines.  Staying active (not on the machines) while he is getting PT), and denies any problems with exertion/exercise. Denies edema. Headaches are improved.  Neuropathy in leg (s)--R>L. No other new neurologic symptoms. No bleeding, bruising, rashes (just the vaginal bleeding). No fever, chills, URI or allergy symptoms. See HPI  PHYSICAL EXAM: BP 126/72 mmHg  Pulse 84  Ht 5' 6.25" (1.683 m)  Wt 172 lb (78.019 kg)  BMI 27.54 kg/m2  Well appearing, pleasant female, in good spirits HEENT: PERRL, EOMI, conjunctiva and sclera clear. OP clear Neck No lymphadenopathy, thyromegaly or carotid bruit Heart: regular rate and rhythm without murmur Lungs: clear  bilaterally Abdomen: soft, normal bowel sounds. No organomegaly or mass Tender along lower abdomen. No rebound or guarding Extremities: no edema, normal pulses Skin: normal turgor, no lesions, not particularly pale Neuro: alert and oriented, cranial nerves intact, normal gait. Psych: normal mood, affect, hygiene, grooming, eye contact and speech.    Lab Results  Component Value Date   CHOL 173 07/21/2015   HDL 48 07/21/2015   LDLCALC 107 07/21/2015   TRIG 91 07/21/2015   CHOLHDL 3.6 07/21/2015     Chemistry      Component Value Date/Time   NA 139 06/10/2015 0814   NA 138 10/29/2014 0001   K 4.0 06/10/2015 0814   K 4.5 10/29/2014 0001   CL 106 10/29/2014 0001   CO2 23 06/10/2015 0814   CO2 22 10/29/2014 0001   BUN 14.2 06/10/2015 0814   BUN 16 10/29/2014 0001   CREATININE 1.0 06/10/2015 0814   CREATININE 0.88 10/29/2014 0001   CREATININE 0.90 02/28/2011 0938      Component Value Date/Time   CALCIUM 10.5* 06/10/2015  [id number]   CALCIUM 9.8 10/29/2014 0001   ALKPHOS 32* 07/21/2015 0801   ALKPHOS 39* 06/10/2015 0814   AST 18 07/21/2015 0801   AST 19 06/10/2015 0814   ALT 32* 07/21/2015 0801   ALT 18 06/10/2015 0814   BILITOT 0.4 07/21/2015 0801   BILITOT 0.41 06/10/2015 0814      ASSESSMENT/PLAN:  Dysfunctional uterine bleeding - hysterectomy scheduled with Dr. Philis Pique  Pelvic pain in female  Essential hypertension, benign - well controlled  Pure hyperglyceridemia - controlled on current meds  Chronic migraine without aura without status migrainosus, not intractable - improved under the care of Dr. Tomi Likens, on her current med regimen  Neuropathy ([place]) - improved with higher dose Lyrica. scheduled for injection (L5 radiculopathy  MGUS (monoclonal gammopathy of unknown significance) - stable  Cervical disc disease   Patient assistance for higher dose Lyrica--send to Mickel Baas to help get.  Samples given today  Send note to Dr. Hulan Fray for surgery as long  as EKG (assuming will be done at pre-op on 5/15 at hospital) is okay.

## 2015-11-10 NOTE — Patient Instructions (Signed)
Continue your current medications. We will work on getting the higher dose of Lyrica through Patient Assistance program. We will send a note to Dr. Philis Pique that you are cleared for surgery--EKG is needed, but I suspect this will be done at the pre-op at [facility] hospital on Monday

## 2015-11-11 ENCOUNTER — Encounter: Payer: Self-pay | Admitting: Family Medicine

## 2015-11-11 NOTE — Progress Notes (Signed)
Letter typed & faxed to [name] office # [phone]

## 2015-11-11 NOTE — Patient Instructions (Addendum)
Your procedure is scheduled on: Wednesday, 5/24  Enter through the Main Entrance of [facility] at: 6 AM  Pick up the phone at the desk and dial [phone].  Call this number if you have problems the morning of surgery: [phone].  Remember: Do NOT eat food or drink after midnight Tuesday, 5/23  Take these medicines the morning of surgery with a SIP OF WATER: amlodipine, wellbutrin XR, fenofibrate, lyrica, prozac, allegra and lorazepam if needed.   Do NOT wear jewelry (body piercing), metal hair clips/bobby pins, make-up, or nail polish. Do NOT wear lotions, powders, or perfumes.  You may wear deoderant. Do NOT shave for 48 hours prior to surgery. Do NOT bring valuables to the hospital.   Leave suitcase in car.  After surgery it may be brought to your room.  For patients admitted to the hospital, checkout time is 11:00 AM the day of discharge. Home with husband Richardson Landry cell [phone].

## 2015-11-15 ENCOUNTER — Encounter (HOSPITAL_COMMUNITY)
Admission: RE | Admit: 2015-11-15 | Discharge: 2015-11-15 | Disposition: A | Discharge status: Home / Self Care | Payer: Medicare Other | Source: Ambulatory Visit | Attending: Obstetrics and Gynecology | Admitting: Obstetrics and Gynecology

## 2015-11-15 ENCOUNTER — Other Ambulatory Visit: Payer: Self-pay

## 2015-11-15 ENCOUNTER — Encounter (HOSPITAL_COMMUNITY): Payer: Self-pay

## 2015-11-15 DIAGNOSIS — N393 Stress incontinence (female) (male): Secondary | ICD-10-CM | POA: Insufficient documentation

## 2015-11-15 DIAGNOSIS — E785 Hyperlipidemia, unspecified: Secondary | ICD-10-CM | POA: Insufficient documentation

## 2015-11-15 DIAGNOSIS — N8110 Cystocele, unspecified: Secondary | ICD-10-CM | POA: Insufficient documentation

## 2015-11-15 DIAGNOSIS — F419 Anxiety disorder, unspecified: Secondary | ICD-10-CM | POA: Insufficient documentation

## 2015-11-15 DIAGNOSIS — Z01812 Encounter for preprocedural laboratory examination: Secondary | ICD-10-CM | POA: Insufficient documentation

## 2015-11-15 DIAGNOSIS — F329 Major depressive disorder, single episode, unspecified: Secondary | ICD-10-CM | POA: Diagnosis not present

## 2015-11-15 DIAGNOSIS — I10 Essential (primary) hypertension: Secondary | ICD-10-CM | POA: Diagnosis not present

## 2015-11-15 DIAGNOSIS — Z87891 Personal history of nicotine dependence: Secondary | ICD-10-CM | POA: Insufficient documentation

## 2015-11-15 DIAGNOSIS — Z79899 Other long term (current) drug therapy: Secondary | ICD-10-CM | POA: Insufficient documentation

## 2015-11-15 DIAGNOSIS — N920 Excessive and frequent menstruation with regular cycle: Secondary | ICD-10-CM | POA: Insufficient documentation

## 2015-11-15 DIAGNOSIS — Z01810 Encounter for preprocedural cardiovascular examination: Secondary | ICD-10-CM | POA: Insufficient documentation

## 2015-11-15 DIAGNOSIS — M797 Fibromyalgia: Secondary | ICD-10-CM | POA: Diagnosis not present

## 2015-11-15 DIAGNOSIS — D259 Leiomyoma of uterus, unspecified: Secondary | ICD-10-CM | POA: Insufficient documentation

## 2015-11-15 HISTORY — DX: Hyperlipidemia, unspecified: E78.5

## 2015-11-15 HISTORY — DX: Depression, unspecified: F32.A

## 2015-11-15 HISTORY — DX: Unspecified osteoarthritis, unspecified site: M19.90

## 2015-11-15 HISTORY — DX: Calculus of kidney: N20.0

## 2015-11-15 HISTORY — DX: Fibromyalgia: M79.7

## 2015-11-15 HISTORY — DX: Other seasonal allergic rhinitis: J30.2

## 2015-11-15 HISTORY — DX: Monoclonal gammopathy: D47.2

## 2015-11-15 HISTORY — DX: Major depressive disorder, single episode, unspecified: F32.9

## 2015-11-15 LAB — CBC
HCT: 37.8 % (ref 36.0–46.0)
Hemoglobin: 12.9 g/dL (ref 12.0–15.0)
MCH: 32.9 pg (ref 26.0–34.0)
MCHC: 34.1 g/dL (ref 30.0–36.0)
MCV: 96.4 fL (ref 78.0–100.0)
Platelets: 341 10*3/uL (ref 150–400)
RBC: 3.92 MIL/uL (ref 3.87–5.11)
RDW: 13.3 % (ref 11.5–15.5)
WBC: 6.9 10*3/uL (ref 4.0–10.5)

## 2015-11-15 LAB — BASIC METABOLIC PANEL
Anion gap: 8 (ref 5–15)
BUN: 16 mg/dL (ref 6–20)
CO2: 26 mmol/L (ref 22–32)
Calcium: 10.1 mg/dL (ref 8.9–10.3)
Chloride: 105 mmol/L (ref 101–111)
Creatinine, Ser: 0.91 mg/dL (ref 0.44–1.00)
GFR calc Af Amer: >60 mL/min (ref >60)
GFR calc non Af Amer: >60 mL/min (ref >60)
Glucose, Bld: 86 mg/dL (ref 65–99)
Potassium: 4.1 mmol/L (ref 3.5–5.1)
Sodium: 139 mmol/L (ref 135–145)

## 2015-11-15 LAB — TYPE AND SCREEN
ABO/RH(D): O POS
Antibody Screen: NEGATIVE

## 2015-11-15 LAB — ABO/RH: ABO/RH(D): O POS

## 2015-11-17 ENCOUNTER — Telehealth: Payer: Self-pay | Admitting: Family Medicine

## 2015-11-17 NOTE — Telephone Encounter (Signed)
Completed form

## 2015-11-17 NOTE — Telephone Encounter (Signed)
[facility] t# [phone] to change dosage & they will fax form

## 2015-11-23 MED ORDER — CLINDAMYCIN PHOSPHATE 900 MG/50ML IV SOLN
900.0000 mg | INTRAVENOUS | Status: AC
  Administered 2015-11-24: 900 mg via INTRAVENOUS
  Filled 2015-11-23: qty 50

## 2015-11-23 MED ORDER — CIPROFLOXACIN IN D5W 400 MG/200ML IV SOLN
400.0000 mg | INTRAVENOUS | Status: AC
  Administered 2015-11-24: 400 mg via INTRAVENOUS
  Filled 2015-11-23: qty 200

## 2015-11-23 NOTE — H&P (Signed)
50 y.o. yo complains of menorrhaghia, fibroids and SUI. Menorrhagia and metrrhaghia with SUI. Pt has [place] previously 10x7x6 cm with multiple fibroids and 3 cm on submucosal. When coughing. sneezing, laughing, jumping will start leaking and can't stop. No other times will have urge leaking on way. Having to change clothes with. A lot of urine gushing. Not tried Azo bladder control. Still having bleeding after SA. Pain is from position and feeling like hitting something. Having hot flashes and night sweats. US showed multiple fibroids, several submucosal pushing into EM.  RO had a simple cyst 2.6 cm without increase in blood flow.  EM was 1 cm but EMB was benign.  Past Medical History  Diagnosis Date  . Anxiety panic attacks    [name]  . Hypertension   . Kidney stones h/o  . Chronic neck pain   . Chronic back pain   . Plantar fasciitis right  . Tendonitis of foot right  . Melanoma ([place]) RUE    [name]  . Depression   . SVD (spontaneous vaginal delivery)     x 3  . Kidney stone     History  . 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 Hyperlipidemia   . Seasonal allergies   . Migraine     OTC med prn - stress related  . Arthritis     knees  . Fibromyalgia     and neuropathy of feet   Past Surgical History  Procedure Laterality Date  . Neck surgery  [name]  Summer 2011    X 4  (C4-T1 Level)  . Tonsillectomy    . Rhinoplasty    . Melanoma excision  05/2015    right forearm; [name]  . Back surgery  Dr.Stern, last sx Summer 2011    X 4  (L3-S1)  . Wisdom tooth extraction    . Eye surgery Bilateral     Lasik   . Colonoscopy    . Kidney stone surgery  2011  . Endometrial ablation  08/2000    Social History   Social History  . Marital Status: Married    Spouse Name: N/A  . Number of Children: N/A  . Years of Education: N/A   Occupational History  . Not on file.   Social History Main  Topics  . Smoking status: Former Smoker -- 0.10 packs/day    Types: Cigarettes    Quit date: 07/04/1987  . Smokeless tobacco: Never Used  . Alcohol Use: 0.0 oz/week    0 Standard drinks or equivalent per week     Comment: ONCE A MONTH Wine  . Drug Use: No  . Sexual Activity:    Partners: Male    Birth Control/ Protection: Other-see comments     Comment: husband had vasectomy   Other Topics Concern  . Not on file   Social History Narrative   Married.  Lives with husband, 1 daughter, brother-in-law (with Down's syndrome), 2 dogs.   2 other children live locally. Disabled due to anxiety.  Previously worked as Public relations account executive.    No current facility-administered medications on file prior to encounter.   Current Outpatient Prescriptions on File Prior to Encounter  Medication Sig Dispense Refill  . amLODipine-benazepril (LOTREL) 5-10 MG capsule Take 1 capsule by mouth daily. 90 capsule 1  . B Complex Vitamins (B-COMPLEX/B-12 PO) Take 1 tablet by mouth daily.     Marland Kitchen buPROPion (  WELLBUTRIN XL) 150 MG 24 hr tablet Take 150 mg by mouth daily.     . Calcium Carb-Cholecalciferol (CALCIUM PLUS VITAMIN D3) 600-800 MG-UNIT TABS Take 1 tablet by mouth daily.    . Cholecalciferol (VITAMIN D) 2000 units tablet Take 2,000 Units by mouth daily.    . divalproex (DEPAKOTE ER) 500 MG 24 hr tablet Take 1 tablet (500 mg total) by mouth daily. (Patient taking differently: Take 500 mg by mouth at bedtime. ) 30 tablet 5  . fexofenadine (ALLEGRA) 180 MG tablet Take 180 mg by mouth daily.    Marland Kitchen FLUoxetine (PROZAC) 40 MG capsule Take 40 mg by mouth daily.     Marland Kitchen JINTELI 1-5 MG-MCG TABS Take 1 tablet by mouth daily.    Marland Kitchen LORazepam (ATIVAN) 0.5 MG tablet Take 1-2 tablets by mouth 3 (three) times daily as needed. Prn anxiety    . Multiple Vitamins-Minerals (MULTIVITAMIN WITH MINERALS) tablet Take 1 tablet by mouth daily.      . Omega-3 Fatty Acids (FISH OIL PO) Take 1 capsule by mouth daily.     . pregabalin  (LYRICA) 75 MG capsule Take 1 capsule (75 mg total) by mouth 2 (two) times daily. (Patient taking differently: Take 150 mg by mouth 2 (two) times daily. ) 180 capsule 3  . Probiotic Product (TRUBIOTICS PO) Take 1 capsule by mouth daily.      Allergies  Allergen Reactions  . Penicillins Shortness Of Breath and Rash    Has patient had a PCN reaction causing immediate rash, facial/tongue/throat swelling, SOB or lightheadedness with hypotension: no Has patient had a PCN reaction causing severe rash involving mucus membranes or skin necrosis: no Has patient had a PCN reaction that required hospitalization no Has patient had a PCN reaction occurring within the last 10 years: no If all of the above answers are "NO", then may proceed with Cephalosporin use.   . Codeine Other (See Comments)    Manic depressive  . Voltaren [Diclofenac] Nausea Only and Other (See Comments)    Pt stated "tore stomach up"  . Erythromycin Rash    @VITALS2 @  Lungs: clear to ascultation Cor:  RRR Abdomen:  soft, nontender, nondistended. Ex:  no cords, erythema Pelvic: Vulva: no masses, no atrophy, no lesions\ls1Bladder/Urethra: normal meatus, no urethral discharge, no urethral mass, bladder non distended (urethral hypermobile >>45) Vagina no tenderness, no erythema, no abnormal vaginal discharge, no vesicle(s) or ulcers, no rectocele, cystocele (-1 to 0).  Cervix: grossly normal, no discharge, no cervical motion tenderness . Uterus: 12 weeks, normal shape, midline, mobile, non-tender, no uterine prolapse. Adnexa/Parametria: no parametrial tenderness, no parametrial mass, no adnexal tenderness, no ovarian mass    A:  Sx fibroid uterus with menorrhagia, metrarhaghia, SUI and cystocele.  For Robo TLH, salpingectomies, possible BSO, TVT, A repair and cysto.   P: All risks, benefits and alternatives d/w patient and she desires to proceed.  Patient has undergone a modified bowel prep and will receive preop antibiotics and  SCDs during the operation.     Lillymae Duet,Cinthia A

## 2015-11-23 NOTE — Anesthesia Preprocedure Evaluation (Signed)
Anesthesia Evaluation  Patient identified by MRN, date of birth, ID band Patient awake    Reviewed: Allergy & Precautions, NPO status , Patient's Chart, lab work & pertinent test results  Airway Mallampati: II  TM Distance: >3 FB Neck ROM: Full    Dental no notable dental hx.    Pulmonary neg pulmonary ROS, former smoker,    Pulmonary exam normal breath sounds clear to auscultation       Cardiovascular hypertension, Pt. on medications Normal cardiovascular exam Rhythm:Regular Rate:Normal     Neuro/Psych negative neurological ROS  negative psych ROS   GI/Hepatic negative GI ROS, Neg liver ROS,   Endo/Other  negative endocrine ROS  Renal/GU negative Renal ROS  negative genitourinary   Musculoskeletal negative musculoskeletal ROS (+)   Abdominal   Peds negative pediatric ROS (+)  Hematology negative hematology ROS (+)   Anesthesia Other Findings   Reproductive/Obstetrics negative OB ROS                             Anesthesia Physical Anesthesia Plan  ASA: II  Anesthesia Plan: General   Post-op Pain Management:    Induction: Intravenous  Airway Management Planned: Oral ETT  Additional Equipment:   Intra-op Plan:   Post-operative Plan: Extubation in OR  Informed Consent: I have reviewed the patients History and Physical, chart, labs and discussed the procedure including the risks, benefits and alternatives for the proposed anesthesia with the patient or authorized representative who has indicated his/her understanding and acceptance.   Dental advisory given  Plan Discussed with: CRNA and Surgeon  Anesthesia Plan Comments:         Anesthesia Quick Evaluation

## 2015-11-24 ENCOUNTER — Encounter (HOSPITAL_COMMUNITY): Payer: Self-pay | Admitting: Certified Registered Nurse Anesthetist

## 2015-11-24 ENCOUNTER — Ambulatory Visit (HOSPITAL_COMMUNITY)
Admission: RE | Admit: 2015-11-24 | Discharge: 2015-11-25 | Disposition: A | Discharge status: Home / Self Care | Payer: Medicare Other | Source: Ambulatory Visit | Attending: Obstetrics and Gynecology | Admitting: Obstetrics and Gynecology

## 2015-11-24 ENCOUNTER — Encounter (HOSPITAL_COMMUNITY)
Admission: RE | Disposition: A | Discharge status: Home / Self Care | Payer: Self-pay | Source: Ambulatory Visit | Attending: Obstetrics and Gynecology

## 2015-11-24 ENCOUNTER — Ambulatory Visit (HOSPITAL_COMMUNITY): Payer: Medicare Other | Admitting: Anesthesiology

## 2015-11-24 DIAGNOSIS — M13862 Other specified arthritis, left knee: Secondary | ICD-10-CM | POA: Diagnosis not present

## 2015-11-24 DIAGNOSIS — Z87891 Personal history of nicotine dependence: Secondary | ICD-10-CM | POA: Insufficient documentation

## 2015-11-24 DIAGNOSIS — Z885 Allergy status to narcotic agent status: Secondary | ICD-10-CM | POA: Insufficient documentation

## 2015-11-24 DIAGNOSIS — Z881 Allergy status to other antibiotic agents status: Secondary | ICD-10-CM | POA: Diagnosis not present

## 2015-11-24 DIAGNOSIS — N803 Endometriosis of pelvic peritoneum: Secondary | ICD-10-CM | POA: Diagnosis not present

## 2015-11-24 DIAGNOSIS — D472 Monoclonal gammopathy: Secondary | ICD-10-CM | POA: Insufficient documentation

## 2015-11-24 DIAGNOSIS — N921 Excessive and frequent menstruation with irregular cycle: Secondary | ICD-10-CM | POA: Diagnosis present

## 2015-11-24 DIAGNOSIS — N8110 Cystocele, unspecified: Secondary | ICD-10-CM | POA: Diagnosis not present

## 2015-11-24 DIAGNOSIS — F329 Major depressive disorder, single episode, unspecified: Secondary | ICD-10-CM | POA: Diagnosis not present

## 2015-11-24 DIAGNOSIS — D259 Leiomyoma of uterus, unspecified: Secondary | ICD-10-CM | POA: Diagnosis not present

## 2015-11-24 DIAGNOSIS — F410 Panic disorder [episodic paroxysmal anxiety] without agoraphobia: Secondary | ICD-10-CM | POA: Insufficient documentation

## 2015-11-24 DIAGNOSIS — N393 Stress incontinence (female) (male): Secondary | ICD-10-CM | POA: Insufficient documentation

## 2015-11-24 DIAGNOSIS — Z9889 Other specified postprocedural states: Secondary | ICD-10-CM

## 2015-11-24 DIAGNOSIS — Z87442 Personal history of urinary calculi: Secondary | ICD-10-CM | POA: Diagnosis not present

## 2015-11-24 DIAGNOSIS — N951 Menopausal and female climacteric states: Secondary | ICD-10-CM | POA: Insufficient documentation

## 2015-11-24 DIAGNOSIS — I10 Essential (primary) hypertension: Secondary | ICD-10-CM | POA: Insufficient documentation

## 2015-11-24 DIAGNOSIS — Z880 Allergy status to penicillin: Secondary | ICD-10-CM | POA: Insufficient documentation

## 2015-11-24 DIAGNOSIS — M13861 Other specified arthritis, right knee: Secondary | ICD-10-CM | POA: Insufficient documentation

## 2015-11-24 DIAGNOSIS — E785 Hyperlipidemia, unspecified: Secondary | ICD-10-CM | POA: Diagnosis not present

## 2015-11-24 DIAGNOSIS — Z888 Allergy status to other drugs, medicaments and biological substances status: Secondary | ICD-10-CM | POA: Diagnosis not present

## 2015-11-24 DIAGNOSIS — G5783 Other specified mononeuropathies of bilateral lower limbs: Secondary | ICD-10-CM | POA: Diagnosis not present

## 2015-11-24 HISTORY — PX: ABLATION ON ENDOMETRIOSIS: SHX5787

## 2015-11-24 HISTORY — PX: BLADDER SUSPENSION: SHX72

## 2015-11-24 HISTORY — PX: CYSTOCELE REPAIR: SHX163

## 2015-11-24 HISTORY — PX: CYSTOSCOPY: SHX5120

## 2015-11-24 HISTORY — PX: ROBOTIC ASSISTED BILATERAL SALPINGO OOPHERECTOMY: SHX6078

## 2015-11-24 HISTORY — PX: ROBOTIC ASSISTED TOTAL HYSTERECTOMY WITH SALPINGECTOMY: SHX6679

## 2015-11-24 LAB — PREGNANCY, URINE: Preg Test, Ur: NEGATIVE

## 2015-11-24 SURGERY — ROBOTIC ASSISTED TOTAL HYSTERECTOMY WITH SALPINGECTOMY
Anesthesia: General | Site: Vagina

## 2015-11-24 MED ORDER — EPHEDRINE SULFATE 50 MG/ML IJ SOLN
INTRAMUSCULAR | Status: DC | PRN
  Administered 2015-11-24: 10 mg via INTRAVENOUS

## 2015-11-24 MED ORDER — ESTRADIOL 0.1 MG/GM VA CREA
TOPICAL_CREAM | VAGINAL | Status: AC
Start: 2015-11-24 — End: 2015-11-24
  Filled 2015-11-24: qty 42.5

## 2015-11-24 MED ORDER — BUPROPION HCL ER (XL) 150 MG PO TB24
150.0000 mg | ORAL_TABLET | Freq: Every day | ORAL | Status: DC
  Filled 2015-11-24: qty 1

## 2015-11-24 MED ORDER — AMLODIPINE BESY-BENAZEPRIL HCL 5-10 MG PO CAPS: 1.0000 | ORAL_CAPSULE | Freq: Every day | ORAL | Status: DC

## 2015-11-24 MED ORDER — LACTATED RINGERS IV SOLN
INTRAVENOUS | Status: DC
  Administered 2015-11-24 (×3): via INTRAVENOUS

## 2015-11-24 MED ORDER — CALCIUM CARBONATE-VITAMIN D 500-200 MG-UNIT PO TABS
1.0000 | ORAL_TABLET | Freq: Every day | ORAL | Status: DC
  Administered 2015-11-25: 1 via ORAL
  Filled 2015-11-24: qty 1

## 2015-11-24 MED ORDER — LORAZEPAM 1 MG PO TABS: 0.5000 mg | ORAL_TABLET | Freq: Three times a day (TID) | ORAL | Status: DC | PRN

## 2015-11-24 MED ORDER — LIDOCAINE-EPINEPHRINE (PF) 1 %-1:200000 IJ SOLN
INTRAMUSCULAR | Status: AC
  Filled 2015-11-24: qty 30

## 2015-11-24 MED ORDER — LIDOCAINE HCL (CARDIAC) 20 MG/ML IV SOLN
INTRAVENOUS | Status: DC | PRN
  Administered 2015-11-24: 50 mg via INTRAVENOUS

## 2015-11-24 MED ORDER — KETOROLAC TROMETHAMINE 30 MG/ML IJ SOLN
INTRAMUSCULAR | Status: AC
  Filled 2015-11-24: qty 1

## 2015-11-24 MED ORDER — ALBUMIN HUMAN 5 % IV SOLN
12.5000 g | Freq: Once | INTRAVENOUS | Status: AC
  Administered 2015-11-24: 12.5 g via INTRAVENOUS
  Filled 2015-11-24: qty 250

## 2015-11-24 MED ORDER — ONDANSETRON HCL 4 MG/2ML IJ SOLN
INTRAMUSCULAR | Status: DC | PRN
  Administered 2015-11-24: 4 mg via INTRAVENOUS

## 2015-11-24 MED ORDER — SUGAMMADEX SODIUM 200 MG/2ML IV SOLN
INTRAVENOUS | Status: DC | PRN
  Administered 2015-11-24: 150 mg via INTRAVENOUS

## 2015-11-24 MED ORDER — SODIUM CHLORIDE 0.9 % IJ SOLN
INTRAMUSCULAR | Status: AC
  Filled 2015-11-24: qty 50

## 2015-11-24 MED ORDER — ESTRADIOL 0.1 MG/GM VA CREA
TOPICAL_CREAM | VAGINAL | Status: AC
  Filled 2015-11-24: qty 42.5

## 2015-11-24 MED ORDER — ROPIVACAINE HCL 5 MG/ML IJ SOLN
INTRAMUSCULAR | Status: AC
  Filled 2015-11-24: qty 30

## 2015-11-24 MED ORDER — IBUPROFEN 800 MG PO TABS: 800.0000 mg | ORAL_TABLET | Freq: Three times a day (TID) | ORAL | Status: DC | PRN

## 2015-11-24 MED ORDER — PROCHLORPERAZINE EDISYLATE 5 MG/ML IJ SOLN: 10.0000 mg | Freq: Once | INTRAMUSCULAR | Status: DC

## 2015-11-24 MED ORDER — KETOROLAC TROMETHAMINE 30 MG/ML IJ SOLN
INTRAMUSCULAR | Status: DC | PRN
  Administered 2015-11-24: 30 mg via INTRAVENOUS

## 2015-11-24 MED ORDER — LACTATED RINGERS IR SOLN
Status: DC | PRN
  Administered 2015-11-24: 3000 mL

## 2015-11-24 MED ORDER — FENTANYL CITRATE (PF) 250 MCG/5ML IJ SOLN
INTRAMUSCULAR | Status: AC
  Filled 2015-11-24: qty 5

## 2015-11-24 MED ORDER — NEOSTIGMINE METHYLSULFATE 10 MG/10ML IV SOLN
INTRAVENOUS | Status: AC
  Filled 2015-11-24: qty 1

## 2015-11-24 MED ORDER — ESTRADIOL 0.1 MG/GM VA CREA
1.0000 | TOPICAL_CREAM | Freq: Once | VAGINAL | Status: AC
  Administered 2015-11-24: 1 via VAGINAL
  Filled 2015-11-24: qty 42.5

## 2015-11-24 MED ORDER — SUGAMMADEX SODIUM 200 MG/2ML IV SOLN
INTRAVENOUS | Status: AC
  Filled 2015-11-24: qty 2

## 2015-11-24 MED ORDER — BENAZEPRIL HCL 10 MG PO TABS
10.0000 mg | ORAL_TABLET | Freq: Every day | ORAL | Status: DC
  Filled 2015-11-24: qty 1

## 2015-11-24 MED ORDER — LIDOCAINE-EPINEPHRINE 0.5 %-1:200000 IJ SOLN: INTRAMUSCULAR | Status: DC | PRN

## 2015-11-24 MED ORDER — HYDROMORPHONE HCL 1 MG/ML IJ SOLN
INTRAMUSCULAR | Status: AC
  Filled 2015-11-24: qty 1

## 2015-11-24 MED ORDER — ONDANSETRON HCL 4 MG/2ML IJ SOLN: 4.0000 mg | Freq: Four times a day (QID) | INTRAMUSCULAR | Status: DC | PRN

## 2015-11-24 MED ORDER — PROPOFOL 10 MG/ML IV BOLUS
INTRAVENOUS | Status: AC
  Filled 2015-11-24: qty 20

## 2015-11-24 MED ORDER — PREGABALIN 75 MG PO CAPS
150.0000 mg | ORAL_CAPSULE | Freq: Two times a day (BID) | ORAL | Status: DC
  Administered 2015-11-24: 150 mg via ORAL
  Filled 2015-11-24: qty 2

## 2015-11-24 MED ORDER — AMLODIPINE BESYLATE 5 MG PO TABS
5.0000 mg | ORAL_TABLET | Freq: Every day | ORAL | Status: DC
  Filled 2015-11-24: qty 1

## 2015-11-24 MED ORDER — HYDROMORPHONE HCL 1 MG/ML IJ SOLN
0.2500 mg | INTRAMUSCULAR | Status: DC | PRN
  Administered 2015-11-24: 0.25 mg via INTRAVENOUS

## 2015-11-24 MED ORDER — STERILE WATER FOR IRRIGATION IR SOLN
Status: DC | PRN
  Administered 2015-11-24: 1000 mL

## 2015-11-24 MED ORDER — ROCURONIUM BROMIDE 100 MG/10ML IV SOLN
INTRAVENOUS | Status: DC | PRN
  Administered 2015-11-24: 10 mg via INTRAVENOUS
  Administered 2015-11-24: 40 mg via INTRAVENOUS
  Administered 2015-11-24: 10 mg via INTRAVENOUS

## 2015-11-24 MED ORDER — SCOPOLAMINE 1 MG/3DAYS TD PT72
MEDICATED_PATCH | TRANSDERMAL | Status: AC
  Administered 2015-11-24: 1.5 mg via TRANSDERMAL
  Filled 2015-11-24: qty 1

## 2015-11-24 MED ORDER — FENTANYL CITRATE (PF) 100 MCG/2ML IJ SOLN
INTRAMUSCULAR | Status: DC | PRN
  Administered 2015-11-24 (×5): 50 ug via INTRAVENOUS

## 2015-11-24 MED ORDER — ESTRADIOL 0.1 MG/GM VA CREA
TOPICAL_CREAM | VAGINAL | Status: DC | PRN
  Administered 2015-11-24: 1 via VAGINAL

## 2015-11-24 MED ORDER — KETOROLAC TROMETHAMINE 30 MG/ML IJ SOLN: 30.0000 mg | Freq: Four times a day (QID) | INTRAMUSCULAR | Status: DC

## 2015-11-24 MED ORDER — LIDOCAINE HCL (PF) 1 % IJ SOLN
INTRAMUSCULAR | Status: AC
  Filled 2015-11-24: qty 5

## 2015-11-24 MED ORDER — DEXAMETHASONE SODIUM PHOSPHATE 4 MG/ML IJ SOLN
INTRAMUSCULAR | Status: DC | PRN
  Administered 2015-11-24: 4 mg via INTRAVENOUS

## 2015-11-24 MED ORDER — ONDANSETRON HCL 4 MG PO TABS: 4.0000 mg | ORAL_TABLET | Freq: Four times a day (QID) | ORAL | Status: DC | PRN

## 2015-11-24 MED ORDER — MENTHOL 3 MG MT LOZG: 1.0000 | LOZENGE | OROMUCOSAL | Status: DC | PRN

## 2015-11-24 MED ORDER — SCOPOLAMINE 1 MG/3DAYS TD PT72
1.0000 | MEDICATED_PATCH | Freq: Once | TRANSDERMAL | Status: DC
  Administered 2015-11-24: 1.5 mg via TRANSDERMAL

## 2015-11-24 MED ORDER — PROPOFOL 10 MG/ML IV BOLUS
INTRAVENOUS | Status: DC | PRN
  Administered 2015-11-24: 200 mg via INTRAVENOUS

## 2015-11-24 MED ORDER — FLUOXETINE HCL 20 MG PO CAPS
40.0000 mg | ORAL_CAPSULE | Freq: Every day | ORAL | Status: DC
Start: 2015-11-25 — End: 2015-11-25
  Filled 2015-11-24: qty 2

## 2015-11-24 MED ORDER — GLYCOPYRROLATE 0.2 MG/ML IJ SOLN
INTRAMUSCULAR | Status: DC | PRN
  Administered 2015-11-24: 0.2 mg via INTRAVENOUS

## 2015-11-24 MED ORDER — CALCIUM CARB-CHOLECALCIFEROL 600-800 MG-UNIT PO TABS: 1.0000 | ORAL_TABLET | Freq: Every day | ORAL | Status: DC

## 2015-11-24 MED ORDER — GLYCOPYRROLATE 0.2 MG/ML IJ SOLN
INTRAMUSCULAR | Status: AC
  Filled 2015-11-24: qty 3

## 2015-11-24 MED ORDER — NORETHINDRONE-ETH ESTRADIOL 1-5 MG-MCG PO TABS: 1.0000 | ORAL_TABLET | Freq: Every day | ORAL | Status: DC

## 2015-11-24 MED ORDER — LIDOCAINE-EPINEPHRINE (PF) 1 %-1:200000 IJ SOLN
INTRAMUSCULAR | Status: DC | PRN
  Administered 2015-11-24: 10 mL

## 2015-11-24 MED ORDER — LORATADINE 10 MG PO TABS
10.0000 mg | ORAL_TABLET | Freq: Every day | ORAL | Status: DC
  Filled 2015-11-24: qty 1

## 2015-11-24 MED ORDER — MIDAZOLAM HCL 2 MG/2ML IJ SOLN
INTRAMUSCULAR | Status: AC
  Filled 2015-11-24: qty 4

## 2015-11-24 MED ORDER — TIZANIDINE HCL 2 MG PO TABS
2.0000 mg | ORAL_TABLET | Freq: Four times a day (QID) | ORAL | Status: DC | PRN
  Filled 2015-11-24: qty 1

## 2015-11-24 MED ORDER — MIDAZOLAM HCL 5 MG/5ML IJ SOLN
INTRAMUSCULAR | Status: DC | PRN
  Administered 2015-11-24: 2 mg via INTRAVENOUS

## 2015-11-24 MED ORDER — EPHEDRINE 5 MG/ML INJ
INTRAVENOUS | Status: AC
  Filled 2015-11-24: qty 10

## 2015-11-24 MED ORDER — ARTIFICIAL TEARS OP OINT
TOPICAL_OINTMENT | OPHTHALMIC | Status: AC
  Filled 2015-11-24: qty 3.5

## 2015-11-24 MED ORDER — DEXAMETHASONE SODIUM PHOSPHATE 4 MG/ML IJ SOLN
INTRAMUSCULAR | Status: AC
  Filled 2015-11-24: qty 1

## 2015-11-24 MED ORDER — SODIUM CHLORIDE 0.9 % IV SOLN
INTRAVENOUS | Status: DC | PRN
  Administered 2015-11-24: 60 mL

## 2015-11-24 MED ORDER — DIVALPROEX SODIUM ER 500 MG PO TB24
500.0000 mg | ORAL_TABLET | Freq: Every day | ORAL | Status: DC
  Administered 2015-11-24: 500 mg via ORAL
  Filled 2015-11-24: qty 1

## 2015-11-24 MED ORDER — ONDANSETRON HCL 4 MG/2ML IJ SOLN
INTRAMUSCULAR | Status: AC
  Filled 2015-11-24: qty 2

## 2015-11-24 MED ORDER — OXYCODONE-ACETAMINOPHEN 5-325 MG PO TABS
1.0000 | ORAL_TABLET | ORAL | Status: DC | PRN
Start: 2015-11-24 — End: 2015-11-25
  Administered 2015-11-24 – 2015-11-25 (×2): 1 via ORAL
  Filled 2015-11-24 (×2): qty 1

## 2015-11-24 MED ORDER — VITAMIN D 1000 UNITS PO TABS
2000.0000 [IU] | ORAL_TABLET | Freq: Every day | ORAL | Status: DC
  Filled 2015-11-24 (×2): qty 2

## 2015-11-24 MED ORDER — HYDROCODONE-ACETAMINOPHEN 5-325 MG PO TABS: 1.0000 | ORAL_TABLET | Freq: Four times a day (QID) | ORAL | Status: DC | PRN

## 2015-11-24 SURGICAL SUPPLY — 94 items
APL SKNCLS STERI-STRIP NONHPOA (GAUZE/BANDAGES/DRESSINGS) ×5
APL SRG 38 LTWT LNG FL B (MISCELLANEOUS) ×5
APPLICATOR ARISTA FLEXITIP XL (MISCELLANEOUS) ×1 IMPLANT
BARRIER ADHS 3X4 INTERCEED (GAUZE/BANDAGES/DRESSINGS) ×7 IMPLANT
BENZOIN TINCTURE PRP APPL 2/3 (GAUZE/BANDAGES/DRESSINGS) ×6 IMPLANT
BLADE SURG 15 STRL LF C SS BP (BLADE) ×5 IMPLANT
BLADE SURG 15 STRL SS (BLADE) ×6
BRR ADH 4X3 ABS CNTRL BYND (GAUZE/BANDAGES/DRESSINGS) ×10
CANISTER SUCT 3000ML (MISCELLANEOUS) ×6 IMPLANT
CATH BONANNO SUPRAPUBIC 14G (CATHETERS) IMPLANT
CATH ROBINSON RED A/P 16FR (CATHETERS) ×6 IMPLANT
CLOTH BEACON ORANGE TIMEOUT ST (SAFETY) ×6 IMPLANT
CONT PATH 16OZ SNAP LID 3702 (MISCELLANEOUS) ×6 IMPLANT
COVER BACK TABLE 60X90IN (DRAPES) ×12 IMPLANT
COVER TIP SHEARS 8 DVNC (MISCELLANEOUS) ×5 IMPLANT
COVER TIP SHEARS 8MM DA VINCI (MISCELLANEOUS) ×1
DECANTER SPIKE VIAL GLASS SM (MISCELLANEOUS) ×6 IMPLANT
DEFOGGER SCOPE WARMER CLEARIFY (MISCELLANEOUS) ×6 IMPLANT
DRAPE LG THREE QUARTER DISP (DRAPES) ×1 IMPLANT
DRSG COVADERM PLUS 2X2 (GAUZE/BANDAGES/DRESSINGS) ×24 IMPLANT
DRSG OPSITE POSTOP 3X4 (GAUZE/BANDAGES/DRESSINGS) ×6 IMPLANT
DURAPREP 26ML APPLICATOR (WOUND CARE) ×6 IMPLANT
ELECT REM PT RETURN 9FT ADLT (ELECTROSURGICAL) ×6
ELECTRODE REM PT RTRN 9FT ADLT (ELECTROSURGICAL) ×5 IMPLANT
GAUZE PACKING 1 X5 YD ST (GAUZE/BANDAGES/DRESSINGS) IMPLANT
GAUZE PACKING 2X5 YD STRL (GAUZE/BANDAGES/DRESSINGS) ×6 IMPLANT
GAUZE VASELINE 3X9 (GAUZE/BANDAGES/DRESSINGS) IMPLANT
GLOVE BIO SURGEON STRL SZ 6.5 (GLOVE) ×6 IMPLANT
GLOVE BIO SURGEON STRL SZ7 (GLOVE) ×13 IMPLANT
GLOVE BIOGEL PI IND STRL 6.5 (GLOVE) ×5 IMPLANT
GLOVE BIOGEL PI IND STRL 7.0 (GLOVE) ×15 IMPLANT
GLOVE BIOGEL PI INDICATOR 6.5 (GLOVE) ×1
GLOVE BIOGEL PI INDICATOR 7.0 (GLOVE) ×3
GLOVE ECLIPSE 6.5 STRL STRAW (GLOVE) ×18 IMPLANT
GLOVE SURG SS PI 7.0 STRL IVOR (GLOVE) ×3 IMPLANT
GOWN STRL REUS W/TWL LRG LVL3 (GOWN DISPOSABLE) ×12 IMPLANT
GRASPER BIPOLAR FEN DA VINCI (INSTRUMENTS)
GRASPER BPLR FEN DVNC (INSTRUMENTS) IMPLANT
GYRUS RUMI II 2.5CM BLUE (DISPOSABLE)
GYRUS RUMI II 3.5CM BLUE (DISPOSABLE)
GYRUS RUMI II 4.0CM BLUE (DISPOSABLE)
HEMOSTAT ARISTA ABSORB 3G PWDR (MISCELLANEOUS) ×1 IMPLANT
KIT ACCESSORY DA VINCI DISP (KITS) ×1
KIT ACCESSORY DVNC DISP (KITS) ×5 IMPLANT
LEGGING LITHOTOMY PAIR STRL (DRAPES) ×6 IMPLANT
LIQUID BAND (GAUZE/BANDAGES/DRESSINGS) ×6 IMPLANT
MANIPULATOR UTERINE 4.5 ZUMI (MISCELLANEOUS) IMPLANT
NEEDLE HYPO 22GX1.5 SAFETY (NEEDLE) ×6 IMPLANT
NEEDLE INSUFFLATION 120MM (ENDOMECHANICALS) ×6 IMPLANT
NS IRRIG 1000ML POUR BTL (IV SOLUTION) ×18 IMPLANT
OCCLUDER COLPOPNEUMO (BALLOONS) ×2 IMPLANT
PACK ROBOT WH (CUSTOM PROCEDURE TRAY) ×6 IMPLANT
PACK ROBOTIC GOWN (GOWN DISPOSABLE) ×6 IMPLANT
PACK VAGINAL WOMENS (CUSTOM PROCEDURE TRAY) ×6 IMPLANT
PAD PREP 24X48 CUFFED NSTRL (MISCELLANEOUS) ×12 IMPLANT
PAD TRENDELENBURG POSITION (MISCELLANEOUS) ×6 IMPLANT
PLUG CATH AND CAP STER (CATHETERS) ×6 IMPLANT
POUCH LAPAROSCOPIC INSTRUMENT (MISCELLANEOUS) ×6 IMPLANT
RUMI II 3.0CM BLUE KOH-EFFICIE (DISPOSABLE) ×1 IMPLANT
RUMI II GYRUS 2.5CM BLUE (DISPOSABLE) IMPLANT
RUMI II GYRUS 3.5CM BLUE (DISPOSABLE) IMPLANT
RUMI II GYRUS 4.0CM BLUE (DISPOSABLE) IMPLANT
SET CYSTO W/LG BORE CLAMP LF (SET/KITS/TRAYS/PACK) ×6 IMPLANT
SET IRRIG TUBING LAPAROSCOPIC (IRRIGATION / IRRIGATOR) ×6 IMPLANT
SET TRI-LUMEN FLTR TB AIRSEAL (TUBING) ×1 IMPLANT
SLING TRANS VAGINAL TAPE (Sling) ×1 IMPLANT
SLING UTERINE/ABD GYNECARE TVT (Sling) ×10 IMPLANT
SPONGE SURGIFOAM ABS GEL 12-7 (HEMOSTASIS) ×1 IMPLANT
STRIP CLOSURE SKIN 1/2X4 (GAUZE/BANDAGES/DRESSINGS) ×6 IMPLANT
SUT DVC VLOC 180 0 12IN GS21 (SUTURE)
SUT VIC AB 0 CT1 27 (SUTURE) ×12
SUT VIC AB 0 CT1 27XBRD ANBCTR (SUTURE) ×25 IMPLANT
SUT VIC AB 2-0 CT2 27 (SUTURE) ×12 IMPLANT
SUT VIC AB 2-0 SH 27 (SUTURE) ×12
SUT VIC AB 2-0 SH 27XBRD (SUTURE) IMPLANT
SUT VIC AB 2-0 UR6 27 (SUTURE) ×6 IMPLANT
SUT VICRYL 0 UR6 27IN ABS (SUTURE) ×12 IMPLANT
SUT VICRYL RAPIDE 3 0 (SUTURE) ×12 IMPLANT
SUT VLOC 180 0 9IN  GS21 (SUTURE) ×1
SUT VLOC 180 0 9IN GS21 (SUTURE) IMPLANT
SUTURE DVC VLC 180 0 12IN GS21 (SUTURE) IMPLANT
SYR 50ML LL SCALE MARK (SYRINGE) ×6 IMPLANT
TIP RUMI ORANGE 6.7MMX12CM (TIP) IMPLANT
TIP UTERINE 5.1X6CM LAV DISP (MISCELLANEOUS) IMPLANT
TIP UTERINE 6.7X10CM GRN DISP (MISCELLANEOUS) ×1 IMPLANT
TIP UTERINE 6.7X6CM WHT DISP (MISCELLANEOUS) IMPLANT
TIP UTERINE 6.7X8CM BLUE DISP (MISCELLANEOUS) IMPLANT
TOWEL OR 17X24 6PK STRL BLUE (TOWEL DISPOSABLE) ×12 IMPLANT
TRAY FOLEY CATH SILVER 14FR (SET/KITS/TRAYS/PACK) ×6 IMPLANT
TROCAR DILATING TIP 12MM 150MM (ENDOMECHANICALS) ×6 IMPLANT
TROCAR DISP BLADELESS 8 DVNC (TROCAR) ×5 IMPLANT
TROCAR DISP BLADELESS 8MM (TROCAR) ×1
TROCAR PORT AIRSEAL 5X120 (TROCAR) ×1 IMPLANT
WATER STERILE IRR 1000ML POUR (IV SOLUTION) ×18 IMPLANT

## 2015-11-24 NOTE — Anesthesia Postprocedure Evaluation (Signed)
Anesthesia Post Note  Patient: Lindsey Rodriguez  Procedure(s) Performed: Procedure(s) (LRB): ROBOTIC ASSISTED TOTAL HYSTERECTOMY WITH SALPINGECTOMY (Bilateral) CYSTOSCOPY (N/A) ROBOTIC ASSISTED BILATERAL SALPINGO OOPHORECTOMY (Bilateral) TRANSVAGINAL TAPE (TVT) PROCEDURE (N/A) ABLATION ON ENDOMETRIOSIS (N/A) ANTERIOR REPAIR (CYSTOCELE)  Patient location during evaluation: [facility] Anesthesia Type: General Level of consciousness: awake and alert and oriented Pain management: pain level controlled Vital Signs Assessment: post-procedure vital signs reviewed and stable Respiratory status: spontaneous breathing, nonlabored ventilation, respiratory function stable and patient connected to nasal cannula oxygen Cardiovascular status: stable Postop Assessment: no signs of nausea or vomiting and adequate PO intake Anesthetic complications: no     Last Vitals:  Filed Vitals:   11/24/15 1410 11/24/15 1505  BP: 100/49 96/41  Pulse: 67 67  Temp: 36.6 C   Resp: 16 16    Last Pain:  Filed Vitals:   11/24/15 1515  PainSc: 4    Pain Goal: Patients Stated Pain Goal: 3 (11/24/15 1505)               Willa Rough

## 2015-11-24 NOTE — Progress Notes (Signed)
There has been no change in the patients history, status or exam since the history and physical.  Filed Vitals:   11/24/15 0610  BP: 128/77  Pulse: 81  Temp: 98.3 F (36.8 C)  TempSrc: Oral  Resp: 20    Lab Results  Component Value Date   WBC 6.9 11/15/2015   HGB 12.9 11/15/2015   HCT 37.8 11/15/2015   MCV 96.4 11/15/2015   PLT 341 11/15/2015    Hazel Leveille,Mykira A

## 2015-11-24 NOTE — Transfer of Care (Signed)
Immediate Anesthesia Transfer of Care Note  Patient: Lindsey Rodriguez  Procedure(s) Performed: Procedure(s): ROBOTIC ASSISTED TOTAL HYSTERECTOMY WITH SALPINGECTOMY (Bilateral) CYSTOSCOPY (N/A) ROBOTIC ASSISTED BILATERAL SALPINGO OOPHORECTOMY (Bilateral) TRANSVAGINAL TAPE (TVT) PROCEDURE (N/A) ABLATION ON ENDOMETRIOSIS (N/A)  Patient Location: PACU  Anesthesia Type:General  Level of Consciousness: awake, alert  and oriented  Airway & Oxygen Therapy: Patient Spontanous Breathing and Patient connected to nasal cannula oxygen  Post-op Assessment: Report given to RN and Post -op Vital signs reviewed and stable  Post vital signs: Reviewed and stable  Last Vitals:  Filed Vitals:   11/24/15 0610  BP: 128/77  Pulse: 81  Temp: 36.8 C  Resp: 20    Last Pain: There were no vitals filed for this visit.       Complications: No apparent anesthesia complications

## 2015-11-24 NOTE — Op Note (Signed)
11/24/2015  10:39 AM  PATIENT:  Lindsey Rodriguez  50 y.o. female  PRE-OPERATIVE DIAGNOSIS:  FIBROIDS, menorrhagia, metrarhagia / SUI/cystocele  POST-OPERATIVE DIAGNOSIS:  Same plus endometriosis above L ureter  PROCEDURE:  Procedure(s): ROBOTIC ASSISTED TOTAL HYSTERECTOMY WITH SALPINGECTOMY (Bilateral) CYSTOSCOPY (N/A) ROBOTIC ASSISTED BILATERAL SALPINGO OOPHORECTOMY (Bilateral) TRANSVAGINAL TAPE (TVT) PROCEDURE (N/A) ABLATION ON ENDOMETRIOSIS (N/A)  SURGEON:  Surgeon(s) and Role:    * [name], MD - Primary    * Allyn Kenner, DO - Assisting   ANESTHESIA:   general  EBL:  Total I/O In: 1900 [I.V.:1900] Out: 350 [Urine:200; Blood:150]  LOCAL MEDICATIONS USED:  LIDOCAINE  and OTHER Ropivicaine  SPECIMEN:  Source of Specimen:  uterus, cervix, bilateral tubes  DISPOSITION OF SPECIMEN:  PATHOLOGY  COUNTS:  YES  TOURNIQUET:  * No tourniquets in log *  DICTATION: .Note written in EPIC  PLAN OF CARE: Admit for overnight observation  PATIENT DISPOSITION:  PACU - hemodynamically stable.   Delay start of Pharmacological VTE agent (>24hrs) due to surgical blood loss or risk of bleeding: not applicable  Complications:  None.  Findings:  10 weeks size uterus.  Ovaries were normal.  The ureters were identified during multiple points of the case and were always out of the field of dissection.  On cystoscopy, the bladder was intact and bilateral spill was seen from each ureteral orriface.  The needles for the TVT were NOT in the bladder after first pass. There was a plaque of endometriosis just anterior to the L ureter on the peritoneum.    Medications:  Cipro and Clindamycin; Ropivicaine intra peritoneal; interceed; Arista  Technique:  After adequate anesthesia was achieved the patient was positioned, prepped and draped in usual sterile fashion.  A speculum was placed in the vagina and the cervix dilated with pratt dilators.  The 10 cm Rumi and 3 cm Koh ring were  assembled and placed in proper fashion.  The  Speculum was removed and the bladder catheterized with a foley.    Attention was turned to the abdomen where a 1 cm incision was made 1 cm above the umbilicus.  The veress needle was introduced without aspiration of bowel contents or blood and the abdomen insufflated. The long 12 mm trocar was placed and the other three trocar sites were marked out, all approximately 10 cm from each other and the camera.  Two 8.5 mm trocars were placed on either side of the camera port and a 5 mm assistant port was placed 3 cm above the plane of the other two trocars.  All trocars were inserted under direct visualization of the camera.  The patient was placed in trendelenburg and then the Robot docked.  The PK forceps were placed on arm 2 and the Hot shears on arm 1 and introduced under direct visualization of the camera.  I then broke scrub and sat down at the console.  The above findings were noted and the ureters identified well out of the field of dissection.  The right fallopian tube was isolated and cauterized with the PK.  The Utero-ovarian ligament was then divided with the PK cautery and shears.  The posterior broad ligament was then divided with the hot shears until the uterosacral ligament.  The Broad and cardinal ligaments were then cauterized against the cervix to the level of the Koh ring, securing the uterine artery.  Each pedicle was then incised with the shears.  The anterior leaf was then incised at the reflection of the vessico-uterine  junction and the lateral bladder retracted inferiorly after the round ligament had been divided with the PK forceps.  The left tube was cauterized with the PK and divided with the shears;  then the left utero-ovarian ligament divided with the PK forceps and the scissors.  The round ligament was divided as well and the posterior leaf of the broad ligament then divided with the hot shears. The broad and cardinal ligaments were then  cauterized on the left in the same way.   At the level of the internal os, the uterine arteries were bilaterally cauterized with the PK.  The ureters were identified well out of the field of dissection.  .   The bladder was then able to be retracted inferiorly and the vesico-uterine fascia was incised in the midline until the bladder was removed one cm below the Koh ring.  The hot shears then circumferentially incised the vagina at the level of the reflection on the [facility] ring.  Once the uterus and cervix were amputated, cautery was used to insure hemostasis of the cuff.  Once hemostasis was achieved, the instruments were changed to the mega needle driver and mega suture cut needle driver and the cuff was closed with a running stitches of 0-vicryl V loc.   Cautery was used to ensure hemostasis of the left pedicles very superficially.  The ureters were peristalsing bilaterally well and very lateral to the areas of operation.  An area in the posterior peritoneum on the L was oozing and both interceed and Arista were placed to ensure hemostasis.  The area of endometriosis on the L peritoneum was then cauterized carefully just above the ureter.  The needle was removed through the 8 mm port. The Robot was then undocked and I scrubbed back in.    Attention was then turned to the vagina. The foley remained in place in the urethra and the apex of the cystocele grasped with two allises.  Allises were used to grasp the vaginal mucosa in the midline superior to inferior just below the urethral opening.  The mucosa was injected with 1% lidocaine with epi in the midline and a scalpel used to incise the vaginal mucosa vertically.  Allises were used to retract the vaginal mucosa as the vesico-vaginal fascia was removed from the mucosa with blunt and sharp dissection and adequate room midurethral to pelvic floor bilaterally was developed.  Two small puncture incisions were then made two cm from midline just above the pubic  symphysis.  The abdominal needles from the Gynecare TVT set were introduced behind the pubic bone, through the pelvic floor and out the vagina carefully on each respective side, being careful to hug the posterior of the pubic bone.  The foley was removed and indigo carmine given.  Cystoscopy revealed excellent bilateral spill from each ureteral orifice and NO NEEDLES IN THE BLADDER.   The urethra was clear as well.  The foley was replaced and the vaginal needles attached to the abdominal needles.  The tape was pulled into place through the abdominal incisions, the sheaths removed while a kelly was in place under the mesh sling and the tape cut at the level of just below the skin.  The tape was checked and found to be tight enough and laying flat at [place].  Gelfoam  was placed in the corners up by the pelvic floor to achieve hemostasis of some small bleeders out of reach and too close to the sling for stitches.  Once hemostasis was achieved, three  mattress stitches of 0-vicryl were placed to close the vesico-vaginal fascia under the bladder.  The vaginal mucosa was trimmed and then closed with a running lock stitch of 2-0 vicryl.  A vaginal packing with estrace was placed.     We changed and returned to the abdomen.  Ropivicaine was introduced into the pelvis.  The fascia of the 12 mm trocar was closed with a figure of 8 stitch of 0 vicryl.  The skin incisions were closed with subcuticular stitches of 3-0 vicryl Rapide and Dermabond.  All instruments were removed from the vagina and the patient taken to the recovery room in stable condition.  Jamelle Goldston,Candyce A

## 2015-11-24 NOTE — Addendum Note (Signed)
Addendum  created 11/24/15 1839 by Jonna Munro, CRNA   Modules edited: Clinical Notes   Clinical Notes:  File: [id number]

## 2015-11-24 NOTE — Anesthesia Postprocedure Evaluation (Signed)
Anesthesia Post Note  Patient: Lindsey Rodriguez  Procedure(s) Performed: Procedure(s) (LRB): ROBOTIC ASSISTED TOTAL HYSTERECTOMY WITH SALPINGECTOMY (Bilateral) CYSTOSCOPY (N/A) ROBOTIC ASSISTED BILATERAL SALPINGO OOPHORECTOMY (Bilateral) TRANSVAGINAL TAPE (TVT) PROCEDURE (N/A) ABLATION ON ENDOMETRIOSIS (N/A)  Patient location during evaluation: PACU Anesthesia Type: General Level of consciousness: awake and alert Pain management: pain level controlled Vital Signs Assessment: post-procedure vital signs reviewed and stable Respiratory status: spontaneous breathing, nonlabored ventilation, respiratory function stable and patient connected to nasal cannula oxygen Cardiovascular status: blood pressure returned to baseline and stable Postop Assessment: no signs of nausea or vomiting Anesthetic complications: no    Last Vitals:  Filed Vitals:   11/24/15 0610 11/24/15 1045  BP: 128/77   Pulse: 81   Temp: 36.8 C 36.8 C  Resp: 20 14    Last Pain: There were no vitals filed for this visit.               Lynard Postlewait S

## 2015-11-24 NOTE — Brief Op Note (Signed)
11/24/2015  10:39 AM  PATIENT:  Lindsey Rodriguez  50 y.o. female  PRE-OPERATIVE DIAGNOSIS:  FIBROIDS, menorrhagia, metrarhagia / SUI/cystocele  POST-OPERATIVE DIAGNOSIS:  Same plus endometriosis above L ureter  PROCEDURE:  Procedure(s): ROBOTIC ASSISTED TOTAL HYSTERECTOMY WITH SALPINGECTOMY (Bilateral) CYSTOSCOPY (N/A) ROBOTIC ASSISTED BILATERAL SALPINGO OOPHORECTOMY (Bilateral) TRANSVAGINAL TAPE (TVT) PROCEDURE (N/A) ABLATION ON ENDOMETRIOSIS (N/A)  SURGEON:  Surgeon(s) and Role:    * [name], MD - Primary    * Allyn Kenner, DO - Assisting   ANESTHESIA:   general  EBL:  Total I/O In: 1900 [I.V.:1900] Out: 350 [Urine:200; Blood:150]  LOCAL MEDICATIONS USED:  LIDOCAINE  and OTHER Ropivicaine  SPECIMEN:  Source of Specimen:  uterus, cervix, bilateral tubes  DISPOSITION OF SPECIMEN:  PATHOLOGY  COUNTS:  YES  TOURNIQUET:  * No tourniquets in log *  DICTATION: .Note written in EPIC  PLAN OF CARE: Admit for overnight observation  PATIENT DISPOSITION:  PACU - hemodynamically stable.   Delay start of Pharmacological VTE agent (>24hrs) due to surgical blood loss or risk of bleeding: not applicable  Complications:  None.  Findings:  10 weeks size uterus.  Ovaries were normal.  The ureters were identified during multiple points of the case and were always out of the field of dissection.  On cystoscopy, the bladder was intact and bilateral spill was seen from each ureteral orriface.  The needles for the TVT were NOT in the bladder after first pass. There was a plaque of endometriosis just anterior to the L ureter on the peritoneum.    Medications:  Cipro and Clindamycin; Ropivicaine intra peritoneal; interceed; Arista  Technique:  After adequate anesthesia was achieved the patient was positioned, prepped and draped in usual sterile fashion.  A speculum was placed in the vagina and the cervix dilated with pratt dilators.  The 10 cm Rumi and 3 cm Koh ring were  assembled and placed in proper fashion.  The  Speculum was removed and the bladder catheterized with a foley.    Attention was turned to the abdomen where a 1 cm incision was made 1 cm above the umbilicus.  The veress needle was introduced without aspiration of bowel contents or blood and the abdomen insufflated. The long 12 mm trocar was placed and the other three trocar sites were marked out, all approximately 10 cm from each other and the camera.  Two 8.5 mm trocars were placed on either side of the camera port and a 5 mm assistant port was placed 3 cm above the plane of the other two trocars.  All trocars were inserted under direct visualization of the camera.  The patient was placed in trendelenburg and then the Robot docked.  The PK forceps were placed on arm 2 and the Hot shears on arm 1 and introduced under direct visualization of the camera.  I then broke scrub and sat down at the console.  The above findings were noted and the ureters identified well out of the field of dissection.  The right fallopian tube was isolated and cauterized with the PK.  The Utero-ovarian ligament was then divided with the PK cautery and shears.  The posterior broad ligament was then divided with the hot shears until the uterosacral ligament.  The Broad and cardinal ligaments were then cauterized against the cervix to the level of the Koh ring, securing the uterine artery.  Each pedicle was then incised with the shears.  The anterior leaf was then incised at the reflection of the vessico-uterine  junction and the lateral bladder retracted inferiorly after the round ligament had been divided with the PK forceps.  The left tube was cauterized with the PK and divided with the shears;  then the left utero-ovarian ligament divided with the PK forceps and the scissors.  The round ligament was divided as well and the posterior leaf of the broad ligament then divided with the hot shears. The broad and cardinal ligaments were then  cauterized on the left in the same way.   At the level of the internal os, the uterine arteries were bilaterally cauterized with the PK.  The ureters were identified well out of the field of dissection.  .   The bladder was then able to be retracted inferiorly and the vesico-uterine fascia was incised in the midline until the bladder was removed one cm below the Koh ring.  The hot shears then circumferentially incised the vagina at the level of the reflection on the [facility] ring.  Once the uterus and cervix were amputated, cautery was used to insure hemostasis of the cuff.  Once hemostasis was achieved, the instruments were changed to the mega needle driver and mega suture cut needle driver and the cuff was closed with a running stitches of 0-vicryl V loc.   Cautery was used to ensure hemostasis of the left pedicles very superficially.  The ureters were peristalsing bilaterally well and very lateral to the areas of operation.  An area in the posterior peritoneum on the L was oozing and both interceed and Arista were placed to ensure hemostasis.  The area of endometriosis on the L peritoneum was then cauterized carefully just above the ureter.  The needle was removed through the 8 mm port. The Robot was then undocked and I scrubbed back in.    Attention was then turned to the vagina. The foley remained in place in the urethra and the apex of the cystocele grasped with two allises.  Allises were used to grasp the vaginal mucosa in the midline superior to inferior just below the urethral opening.  The mucosa was injected with 1% lidocaine with epi in the midline and a scalpel used to incise the vaginal mucosa vertically.  Allises were used to retract the vaginal mucosa as the vesico-vaginal fascia was removed from the mucosa with blunt and sharp dissection and adequate room midurethral to pelvic floor bilaterally was developed.  Two small puncture incisions were then made two cm from midline just above the pubic  symphysis.  The abdominal needles from the Gynecare TVT set were introduced behind the pubic bone, through the pelvic floor and out the vagina carefully on each respective side, being careful to hug the posterior of the pubic bone.  The foley was removed and indigo carmine given.  Cystoscopy revealed excellent bilateral spill from each ureteral orifice and NO NEEDLES IN THE BLADDER.   The urethra was clear as well.  The foley was replaced and the vaginal needles attached to the abdominal needles.  The tape was pulled into place through the abdominal incisions, the sheaths removed while a kelly was in place under the mesh sling and the tape cut at the level of just below the skin.  The tape was checked and found to be tight enough and laying flat.   Gelfoam  was placed in the corners up by the pelvic floor to achieve hemostasis of some small bleeders out of reach and too close to the sling for stitches.  Once hemostasis was achieved, three mattress  stitches of 0-vicryl were placed to close the vesico-vaginal fascia under the bladder.  The vaginal mucosa was trimmed and then closed with a running lock stitch of 2-0 vicryl.  A vaginal packing with estrace was placed.     We changed and returned to the abdomen.  Ropivicaine was introduced into the pelvis.  The fascia of the 12 mm trocar was closed with a figure of 8 stitch of 0 vicryl.  The skin incisions were closed with subcuticular stitches of 3-0 vicryl Rapide and Dermabond.  All instruments were removed from the vagina and the patient taken to the recovery room in stable condition.  Maysen Bonsignore,Alixandra A

## 2015-11-24 NOTE — Anesthesia Procedure Notes (Signed)
Procedure Name: Intubation Date/Time: 11/24/2015 7:34 AM Performed by: Bufford Spikes Pre-anesthesia Checklist: Patient identified, Timeout performed, Emergency Drugs available, Suction available and Patient being monitored Patient Re-evaluated:Patient Re-evaluated prior to inductionOxygen Delivery Method: Circle system utilized Preoxygenation: Pre-oxygenation with 100% oxygen Intubation Type: IV induction Ventilation: Mask ventilation without difficulty Laryngoscope Size: Miller and 2 Grade View: Grade I Tube type: Oral Tube size: 7.0 mm Number of attempts: 1 Airway Equipment and Method: Stylet Placement Confirmation: ETT inserted through vocal cords under direct vision,  positive ETCO2 and breath sounds checked- equal and bilateral Secured at: 21 cm Tube secured with: Tape Dental Injury: Teeth and Oropharynx as per pre-operative assessment

## 2015-11-25 ENCOUNTER — Encounter (HOSPITAL_COMMUNITY): Payer: Self-pay | Admitting: Obstetrics and Gynecology

## 2015-11-25 DIAGNOSIS — N921 Excessive and frequent menstruation with irregular cycle: Secondary | ICD-10-CM | POA: Diagnosis not present

## 2015-11-25 LAB — BASIC METABOLIC PANEL
Anion gap: 5 (ref 5–15)
BUN: 13 mg/dL (ref 6–20)
CO2: 25 mmol/L (ref 22–32)
Calcium: 8.5 mg/dL — ABNORMAL LOW (ref 8.9–10.3)
Chloride: 102 mmol/L (ref 101–111)
Creatinine, Ser: 0.83 mg/dL (ref 0.44–1.00)
GFR calc Af Amer: >60 mL/min (ref >60)
GFR calc non Af Amer: >60 mL/min (ref >60)
Glucose, Bld: 92 mg/dL (ref 65–99)
Potassium: 3.9 mmol/L (ref 3.5–5.1)
Sodium: 132 mmol/L — ABNORMAL LOW (ref 135–145)

## 2015-11-25 LAB — CBC
HCT: 30.5 % — ABNORMAL LOW (ref 36.0–46.0)
Hemoglobin: 10.4 g/dL — ABNORMAL LOW (ref 12.0–15.0)
MCH: 32.7 pg (ref 26.0–34.0)
MCHC: 34.1 g/dL (ref 30.0–36.0)
MCV: 95.9 fL (ref 78.0–100.0)
Platelets: 287 10*3/uL (ref 150–400)
RBC: 3.18 MIL/uL — ABNORMAL LOW (ref 3.87–5.11)
RDW: 13 % (ref 11.5–15.5)
WBC: 13.5 10*3/uL — ABNORMAL HIGH (ref 4.0–10.5)

## 2015-11-25 MED ORDER — NITROFURANTOIN MONOHYD MACRO 100 MG PO CAPS: 100.0000 mg | ORAL_CAPSULE | Freq: Two times a day (BID) | ORAL | Status: DC

## 2015-11-25 MED ORDER — OXYCODONE-ACETAMINOPHEN 5-325 MG PO TABS: 1.0000 | ORAL_TABLET | ORAL | Status: DC | PRN

## 2015-11-25 NOTE — Progress Notes (Signed)
Patient is eating, ambulating, not voiding- catheter in place.  Pain control is good.  BP 95/57 mmHg  Pulse 55  Temp(Src) 98.8 F (37.1 C) (Oral)  Resp 18  SpO2 100%  lungs:   clear to auscultation cor:    RRR Abdomen:  soft, appropriate tenderness, incisions intact and without erythema or exudate. ex:    no cords   Lab Results  Component Value Date   WBC 13.5* 11/25/2015   HGB 10.4* 11/25/2015   HCT 30.5* 11/25/2015   MCV 95.9 11/25/2015   PLT 287 11/25/2015    A/P  Routine care.  Expect d/c per plan.  VAG PACK REMOVED.  Cr normal this am.

## 2015-11-25 NOTE — Discharge Instructions (Signed)
Keep Foley in place- give patient leg bag and show how to change bags.  Instruct on care. voiding trial at office- call and come in at 830 am °

## 2015-11-25 NOTE — Progress Notes (Signed)
Pt is discharged in the care of husband. Denies any pain or discomfort. Spirits are good Pt understands all discharged instructions well Questions are asked and answered.  Foley catheter care instructions were given with good understanding.. Foley was connected  To leg bag.for home use prn..Stable.

## 2015-11-25 NOTE — Discharge Summary (Signed)
Physician Discharge Summary  Patient ID: Lindsey Rodriguez MRN: [id number] DOB/AGE: 07-Aug-1965 50 y.o.  Admit date: 11/24/2015 Discharge date: 11/25/2015  Admission Diagnoses:Menometraghia/fibroids/SUI/cystocele  Discharge Diagnoses:  Active Problems:   Postoperative state   Discharged Condition: good  Hospital Course: uncomplicated Robo TLH/Salpingectomies/TVT/a repair/cysto.  Consults: None  Significant Diagnostic Studies: labs:  Results for orders placed or performed during the hospital encounter of 11/24/15 (from the past 24 hour(s))  CBC     Status: Abnormal   Collection Time: 11/25/15  5:43 AM  Result Value Ref Range   WBC 13.5 (H) 4.0 - 10.5 K/uL   RBC 3.18 (L) 3.87 - 5.11 MIL/uL   Hemoglobin 10.4 (L) 12.0 - 15.0 g/dL   HCT 30.5 (L) 36.0 - 46.0 %   MCV 95.9 78.0 - 100.0 fL   MCH 32.7 26.0 - 34.0 pg   MCHC 34.1 30.0 - 36.0 g/dL   RDW 13.0 11.5 - 15.5 %   Platelets 287 150 - 400 K/uL  Basic metabolic panel     Status: Abnormal   Collection Time: 11/25/15  5:43 AM  Result Value Ref Range   Sodium 132 (L) 135 - 145 mmol/L   Potassium 3.9 3.5 - 5.1 mmol/L   Chloride 102 101 - 111 mmol/L   CO2 25 22 - 32 mmol/L   Glucose, Bld 92 65 - 99 mg/dL   BUN 13 6 - 20 mg/dL   Creatinine, Ser 0.83 0.44 - 1.00 mg/dL   Calcium 8.5 (L) 8.9 - 10.3 mg/dL   GFR calc non Af Amer >60 >60 mL/min   GFR calc Af Amer >60 >60 mL/min   Anion gap 5 5 - 15    Treatments: surgery:  uncomplicated Robo TLH/Salpingectomies/TVT/a repair/cysto.  Discharge Exam: Blood pressure 95/57, pulse 55, temperature 98.8 F (37.1 C), temperature source Oral, resp. rate 18, SpO2 100 %.   Disposition: 01-Home or Self Care  Discharge Instructions    Call MD for:  temperature >100.4    Complete by:  As directed      Diet - low sodium heart healthy    Complete by:  As directed      Discharge instructions    Complete by:  As directed   No driving on narcotics, no sexual activity for 2 weeks.     Increase activity slowly    Complete by:  As directed      May shower / Bathe    Complete by:  As directed   Shower, no bath for 2 weeks.     Remove dressing in 24 hours    Complete by:  As directed      Sexual Activity Restrictions    Complete by:  As directed   No sexual activity for 2 weeks.            Medication List    TAKE these medications        amLODipine-benazepril 5-10 MG capsule  Commonly known as:  LOTREL  Take 1 capsule by mouth daily.     AZO BLADDER CONTROL/GO-LESS PO  Take 1 capsule by mouth daily.     B-COMPLEX/B-12 PO  Take 1 tablet by mouth daily.     baclofen 10 MG tablet  Commonly known as:  LIORESAL  Take 10 mg by mouth 2 (two) times daily as needed for muscle spasms. Reported on 11/10/2015     buPROPion 150 MG 24 hr tablet  Commonly known as:  WELLBUTRIN XL  Take 150 mg by mouth  daily.     CALCIUM PLUS VITAMIN D3 600-800 MG-UNIT Tabs  Generic drug:  Calcium Carb-Cholecalciferol  Take 1 tablet by mouth daily.     divalproex 500 MG 24 hr tablet  Commonly known as:  DEPAKOTE ER  Take 1 tablet (500 mg total) by mouth daily.     fenofibrate 160 MG tablet  TAKE 1 TABLET (160 MG TOTAL) BY MOUTH DAILY.     fexofenadine 180 MG tablet  Commonly known as:  ALLEGRA  Take 180 mg by mouth daily.     FISH OIL PO  Take 1 capsule by mouth daily.     FLUoxetine 40 MG capsule  Commonly known as:  PROZAC  Take 40 mg by mouth daily.     HYDROcodone-acetaminophen 5-325 MG tablet  Commonly known as:  NORCO/VICODIN  Take 1 tablet by mouth every 6 (six) hours as needed for moderate pain. Reported on 11/10/2015     JINTELI 1-5 MG-MCG Tabs tablet  Generic drug:  norethindrone-ethinyl estradiol  Take 1 tablet by mouth daily.     LORazepam 0.5 MG tablet  Commonly known as:  ATIVAN  Take 1-2 tablets by mouth 3 (three) times daily as needed. Prn anxiety     MAGNESIUM MALATE PO  Take 1,250 mg by mouth 2 (two) times daily.     multivitamin with minerals  tablet  Take 1 tablet by mouth daily.     nitrofurantoin (macrocrystal-monohydrate) 100 MG capsule  Commonly known as:  MACROBID  Take 1 capsule (100 mg total) by mouth 2 (two) times daily.     oxyCODONE-acetaminophen 5-325 MG tablet  Commonly known as:  PERCOCET/ROXICET  Take 1-2 tablets by mouth every 4 (four) hours as needed for severe pain (moderate to severe pain (when tolerating fluids)).     pregabalin 75 MG capsule  Commonly known as:  LYRICA  Take 1 capsule (75 mg total) by mouth 2 (two) times daily.     tiZANidine 2 MG tablet  Commonly known as:  ZANAFLEX  Take 2 mg by mouth every 6 (six) hours as needed for muscle spasms.     TRUBIOTICS PO  Take 1 capsule by mouth daily.     Vitamin D 2000 units tablet  Take 2,000 Units by mouth daily.           Follow-up Information    Follow up with Akima Slaugh,Laurie A, MD.   Specialty:  Obstetrics and Gynecology   Why:  tuesday for voiding trial   Contact information:   [address] [place] [address] [phone]       Signed: Zamarion Longest,Taelor A 11/25/2015, 8:12 AM

## 2015-11-27 ENCOUNTER — Encounter (HOSPITAL_COMMUNITY): Payer: Self-pay

## 2015-11-27 ENCOUNTER — Inpatient Hospital Stay (HOSPITAL_COMMUNITY)
Admission: AD | Admit: 2015-11-27 | Discharge: 2015-11-27 | Disposition: A | Discharge status: Home / Self Care | Payer: Medicare Other | Source: Ambulatory Visit | Attending: Obstetrics and Gynecology | Admitting: Obstetrics and Gynecology

## 2015-11-27 DIAGNOSIS — Z880 Allergy status to penicillin: Secondary | ICD-10-CM | POA: Diagnosis not present

## 2015-11-27 DIAGNOSIS — N23 Unspecified renal colic: Secondary | ICD-10-CM

## 2015-11-27 DIAGNOSIS — F419 Anxiety disorder, unspecified: Secondary | ICD-10-CM | POA: Insufficient documentation

## 2015-11-27 DIAGNOSIS — Z85820 Personal history of malignant melanoma of skin: Secondary | ICD-10-CM | POA: Diagnosis not present

## 2015-11-27 DIAGNOSIS — I10 Essential (primary) hypertension: Secondary | ICD-10-CM | POA: Diagnosis not present

## 2015-11-27 DIAGNOSIS — R102 Pelvic and perineal pain: Secondary | ICD-10-CM | POA: Insufficient documentation

## 2015-11-27 DIAGNOSIS — Z87891 Personal history of nicotine dependence: Secondary | ICD-10-CM | POA: Insufficient documentation

## 2015-11-27 DIAGNOSIS — M797 Fibromyalgia: Secondary | ICD-10-CM | POA: Insufficient documentation

## 2015-11-27 NOTE — MAU Provider Note (Signed)
None     Chief Complaint:  Vaginal Pain   Lindsey Rodriguez is  50 y.o. G3P3. She had a robotic hyst last week and was sent home w/a Foley.  Today, she has a feeling "that it is coming out".    Past Medical History  Diagnosis Date  . Anxiety panic attacks    [name]  . Hypertension   . Kidney stones h/o  . Chronic neck pain   . Chronic back pain   . Plantar fasciitis right  . Tendonitis of foot right  . Melanoma ([place]) RUE    [name]  . Depression   . SVD (spontaneous vaginal delivery)     x 3  . Kidney stone     History  . 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 Hyperlipidemia   . Seasonal allergies   . Migraine     OTC med prn - stress related  . Arthritis     knees  . Fibromyalgia     and neuropathy of feet    Past Surgical History  Procedure Laterality Date  . Neck surgery  [name]  Summer 2011    X 4  (C4-T1 Level)  . Tonsillectomy    . Rhinoplasty    . Melanoma excision  05/2015    right forearm; [name]  . Back surgery  Dr.Stern, last sx Summer 2011    X 4  (L3-S1)  . Wisdom tooth extraction    . Eye surgery Bilateral     Lasik   . Colonoscopy    . Kidney stone surgery  2011  . Endometrial ablation  08/2000  . Robotic assisted total hysterectomy with salpingectomy Bilateral 11/24/2015    Procedure: ROBOTIC ASSISTED TOTAL HYSTERECTOMY WITH SALPINGECTOMY;  Surgeon: [name], MD;  Location: [facility];  Service: Gynecology;  Laterality: Bilateral;  . Cystoscopy N/A 11/24/2015    Procedure: CYSTOSCOPY;  Surgeon: [name], MD;  Location: [facility];  Service: Gynecology;  Laterality: N/A;  . Robotic assisted bilateral salpingo oopherectomy Bilateral 11/24/2015    Procedure: ROBOTIC ASSISTED BILATERAL SALPINGO OOPHORECTOMY;  Surgeon: [name], MD;  Location: [facility];  Service: Gynecology;  Laterality: Bilateral;  . Bladder suspension N/A 11/24/2015    Procedure:  TRANSVAGINAL TAPE (TVT) PROCEDURE;  Surgeon: [name], MD;  Location: [facility];  Service: Gynecology;  Laterality: N/A;  . Ablation on endometriosis N/A 11/24/2015    Procedure: ABLATION ON ENDOMETRIOSIS;  Surgeon: [name], MD;  Location: [facility];  Service: Gynecology;  Laterality: N/A;  . Cystocele repair  11/24/2015    Procedure: ANTERIOR REPAIR (CYSTOCELE);  Surgeon: [name], MD;  Location: [facility];  Service: Gynecology;;    Family History  Problem Relation Age of Onset  . Hypertension Mother   . Depression Mother   . Migraines Mother   . Hypothyroidism Mother   . COPD Father   . Heart failure Father 51  . Breast cancer Cousin     40's  . Cancer Cousin     breast; in her 42's  . Diabetes Neg Hx     Social History  Substance Use Topics  . Smoking status: Former Smoker -- 0.10 packs/day    Types: Cigarettes    Quit date: 07/04/1987  . Smokeless tobacco: Never Used  . Alcohol Use: 0.0 oz/week    0 Standard drinks or equivalent per week     Comment: ONCE A MONTH  Wine    Allergies:  Allergies  Allergen Reactions  . Penicillins Shortness Of Breath and Rash    Has patient had a PCN reaction causing immediate rash, facial/tongue/throat swelling, SOB or lightheadedness with hypotension: no Has patient had a PCN reaction causing severe rash involving mucus membranes or skin necrosis: no Has patient had a PCN reaction that required hospitalization no Has patient had a PCN reaction occurring within the last 10 years: no If all of the above answers are "NO", then may proceed with Cephalosporin use.   . Codeine Other (See Comments)    Manic depressive  . Voltaren [Diclofenac] Nausea Only and Other (See Comments)    Pt stated "tore stomach up"  . Erythromycin Rash    Prescriptions prior to admission  Medication Sig Dispense Refill Last Dose  . amLODipine-benazepril (LOTREL) 5-10 MG capsule Take 1 capsule by mouth daily. 90 capsule 1 11/27/2015 at Unknown  time  . B Complex Vitamins (B-COMPLEX/B-12 PO) Take 1 tablet by mouth daily.    11/27/2015 at Unknown time  . buPROPion (WELLBUTRIN XL) 150 MG 24 hr tablet Take 150 mg by mouth daily.    11/27/2015 at Unknown time  . Calcium Carb-Cholecalciferol (CALCIUM PLUS VITAMIN D3) 600-800 MG-UNIT TABS Take 1 tablet by mouth daily.   11/27/2015 at Unknown time  . Cholecalciferol (VITAMIN D) 2000 units tablet Take 2,000 Units by mouth daily.   11/27/2015 at Unknown time  . divalproex (DEPAKOTE ER) 500 MG 24 hr tablet Take 1 tablet (500 mg total) by mouth daily. (Patient taking differently: Take 500 mg by mouth at bedtime. ) 30 tablet 5 11/27/2015 at Unknown time  . fenofibrate 160 MG tablet TAKE 1 TABLET (160 MG TOTAL) BY MOUTH DAILY. 90 tablet 0 11/27/2015 at Unknown time  . fexofenadine (ALLEGRA) 180 MG tablet Take 180 mg by mouth daily.   11/27/2015 at Unknown time  . FLUoxetine (PROZAC) 40 MG capsule Take 40 mg by mouth daily.    11/27/2015 at Unknown time  . LORazepam (ATIVAN) 0.5 MG tablet Take 1-2 tablets by mouth 3 (three) times daily as needed. Prn anxiety   Past Month at Unknown time  . MAGNESIUM MALATE PO Take 1,250 mg by mouth 2 (two) times daily.   11/27/2015 at Unknown time  . Multiple Vitamins-Minerals (MULTIVITAMIN WITH MINERALS) tablet Take 1 tablet by mouth daily.     11/27/2015 at Unknown time  . nitrofurantoin, macrocrystal-monohydrate, (MACROBID) 100 MG capsule Take 1 capsule (100 mg total) by mouth 2 (two) times daily. 14 capsule 2 11/27/2015 at Unknown time  . Omega-3 Fatty Acids (FISH OIL PO) Take 1 capsule by mouth daily.    11/27/2015 at Unknown time  . pregabalin (LYRICA) 75 MG capsule Take 1 capsule (75 mg total) by mouth 2 (two) times daily. (Patient taking differently: Take 75 mg by mouth 4 (four) times daily. ) 180 capsule 3 11/27/2015 at Unknown time  . Probiotic Product (TRUBIOTICS PO) Take 1 capsule by mouth daily.   11/27/2015 at Unknown time  . Pumpkin Seed-Soy Germ (AZO BLADDER  CONTROL/GO-LESS PO) Take 1 capsule by mouth daily.   11/27/2015 at Unknown time  . tiZANidine (ZANAFLEX) 2 MG tablet Take 2 mg by mouth every 6 (six) hours as needed for muscle spasms.   Past Month at Unknown time  . oxyCODONE-acetaminophen (PERCOCET/ROXICET) 5-325 MG tablet Take 1-2 tablets by mouth every 4 (four) hours as needed for severe pain (moderate to severe pain (when tolerating fluids)). (Patient not taking: Reported  on 11/27/2015) 30 tablet 0 Not Taking at Unknown time     Review of Systems   Constitutional: Negative for fever and chills Eyes: Negative for visual disturbances Respiratory: Negative for shortness of breath, dyspnea Cardiovascular: Negative for chest pain or palpitations  Gastrointestinal: Negative for vomiting, diarrhea and constipation Genitourinary: as in HPI Musculoskeletal: Negative for back pain, joint pain, myalgias  Neurological: Negative for dizziness and headaches     Physical Exam   Blood pressure 105/66, pulse 79, temperature 98.5 F (36.9 C), temperature source Oral, resp. rate 18.  General: General appearance - alert, well appearing, and in no distress Chest - normal respiratory effort Heart - normal rate and regular rhythm Abdomen - soft, non distended.  Surgical incisions healing well Pelvic - Foley is properly draining.  There is 8cc H20 (10cc balloon).  Foley replaced AND BALLOON FILLED W/13CC H20 Extremities - no pedal edema noted   Labs: No results found for this or any previous visit (from the past 24 hour(s)). Imaging Studies:  No results found.  Assessment: Urethral pain 2/2 Foley (? Balloon slipping into urethra?)  Plan: Discussed w/Dr Rogue Bussing.  Pt felt immediate relief after replacing catheter.  F/u Tuesday as scheduled  CRESENZO-DISHMAN,Andres Escandon

## 2015-11-27 NOTE — MAU Note (Signed)
Patient states she is feeling much better after urinary catheter was replaced notified Nigel Berthold CNM

## 2015-11-27 NOTE — MAU Note (Signed)
On Wednesday had Robotic lap hysterectomy left her ovaries, went home with foley feels coming or something painful

## 2015-11-27 NOTE — Discharge Instructions (Signed)
Foley Catheter Care, Adult °A Foley catheter is a soft, flexible tube that is placed into the bladder to drain urine. A Foley catheter may be inserted if: °· You leak urine or are not able to control when you urinate (urinary incontinence). °· You are not able to urinate when you need to (urinary retention). °· You had prostate surgery or surgery on the genitals. °· You have certain medical conditions, such as multiple sclerosis, dementia, or a spinal cord injury. °If you are going home with a Foley catheter in place, follow the instructions below. °TAKING CARE OF THE CATHETER °1. Wash your hands with soap and water. °2. Using mild soap and warm water on a clean washcloth: °¨ Clean the area on your body closest to the catheter insertion site using a circular motion, moving away from the catheter. Never wipe toward the catheter because this could sweep bacteria up into the urethra and cause infection. °¨ Remove all traces of soap. Pat the area dry with a clean towel. For males, reposition the foreskin. °3. Attach the catheter to your leg so there is no tension on the catheter. Use adhesive tape or a leg strap. If you are using adhesive tape, remove any sticky residue left behind by the previous tape you used. °4. Keep the drainage bag below the level of the bladder, but keep it off the floor. °5. Check throughout the day to be sure the catheter is working and urine is draining freely. Make sure the tubing does not become kinked. °6. Do not pull on the catheter or try to remove it. Pulling could damage internal tissues. °TAKING CARE OF THE DRAINAGE BAGS °You will be given two drainage bags to take home. One is a large overnight drainage bag, and the other is a smaller leg bag that fits underneath clothing. You may wear the overnight bag at any time, but you should never wear the smaller leg bag at night. Follow the instructions below for how to empty, change, and clean your drainage bags. °Emptying the Drainage  Bag °You must empty your drainage bag when it is  -½ full or at least 2-3 times a day. °1. Wash your hands with soap and water. °2. Keep the drainage bag below your hips, below the level of your bladder. This stops urine from going back into the tubing and into your bladder. °3. Hold the dirty bag over the toilet or a clean container. °4. Open the pour spout at the bottom of the bag and empty the urine into the toilet or container. Do not let the pour spout touch the toilet, container, or any other surface. Doing so can place bacteria on the bag, which can cause an infection. °5. Clean the pour spout with a gauze pad or cotton ball that has rubbing alcohol on it. °6. Close the pour spout. °7. Attach the bag to your leg with adhesive tape or a leg strap. °8. Wash your hands well. °Changing the Drainage Bag °Change your drainage bag once a month or sooner if it starts to smell bad or look dirty. Below are steps to follow when changing the drainage bag. °1. Wash your hands with soap and water. °2. Pinch off the rubber catheter so that urine does not spill out. °3. Disconnect the catheter tube from the drainage tube at the connection valve. Do not let the tubes touch any surface. °4. Clean the end of the catheter tube with an alcohol wipe. Use a different alcohol wipe to clean   the end of the drainage tube. °5. Connect the catheter tube to the drainage tube of the clean drainage bag. °6. Attach the new bag to the leg with adhesive tape or a leg strap. Avoid attaching the new bag too tightly. °7. Wash your hands well. °Cleaning the Drainage Bag °1. Wash your hands with soap and water. °2. Wash the bag in warm, soapy water. °3. Rinse the bag thoroughly with warm water. °4. Fill the bag with a solution of white vinegar and water (1 cup vinegar to 1 qt warm water [.2 L vinegar to 1 L warm water]). Close the bag and soak it for 30 minutes in the solution. °5. Rinse the bag with warm water. °6. Hang the bag to dry with the  pour spout open and hanging downward. °7. Store the clean bag (once it is dry) in a clean plastic bag. °8. Wash your hands well. °PREVENTING INFECTION °· Wash your hands before and after handling your catheter. °· Take showers daily and wash the area where the catheter enters your body. Do not take baths. Replace wet leg straps with dry ones, if this applies. °· Do not use powders, sprays, or lotions on the genital area. Only use creams, lotions, or ointments as directed by your caregiver. °· For females, wipe from front to back after each bowel movement. °· Drink enough fluids to keep your urine clear or pale yellow unless you have a fluid restriction. °· Do not let the drainage bag or tubing touch or lie on the floor. °· Wear cotton underwear to absorb moisture and to keep your skin drier. °SEEK MEDICAL CARE IF:  °· Your urine is cloudy or smells unusually bad. °· Your catheter becomes clogged. °· You are not draining urine into the bag or your bladder feels full. °· Your catheter starts to leak. °SEEK IMMEDIATE MEDICAL CARE IF:  °· You have pain, swelling, redness, or pus where the catheter enters the body. °· You have pain in the abdomen, legs, lower back, or bladder. °· You have a fever. °· You see blood fill the catheter, or your urine is pink or red. °· You have nausea, vomiting, or chills. °· Your catheter gets pulled out. °MAKE SURE YOU:  °· Understand these instructions. °· Will watch your condition. °· Will get help right away if you are not doing well or get worse. °  °This information is not intended to replace advice given to you by your health care provider. Make sure you discuss any questions you have with your health care provider. °  °Document Released: 06/19/2005 Document Revised: 11/03/2013 Document Reviewed: 06/10/2012 °Elsevier Interactive Patient Education ©2016 [employer] ° °

## 2015-12-08 MED ORDER — PREGABALIN 150 MG PO CAPS: 150.0000 mg | ORAL_CAPSULE | Freq: Two times a day (BID) | ORAL | Status: DC

## 2015-12-08 NOTE — Telephone Encounter (Signed)
Received notice from [employer], needs written Rx for new Lyrica dosage

## 2015-12-08 NOTE — Telephone Encounter (Signed)
Rx for 150mg  BID #180 RF x 1 was printed up front.  Please stamp and send to [employer]

## 2015-12-09 ENCOUNTER — Other Ambulatory Visit (HOSPITAL_BASED_OUTPATIENT_CLINIC_OR_DEPARTMENT_OTHER): Payer: Medicare Other

## 2015-12-09 ENCOUNTER — Ambulatory Visit (HOSPITAL_BASED_OUTPATIENT_CLINIC_OR_DEPARTMENT_OTHER): Payer: Medicare Other | Admitting: Hematology

## 2015-12-09 ENCOUNTER — Other Ambulatory Visit: Payer: Self-pay | Admitting: *Deleted

## 2015-12-09 ENCOUNTER — Telehealth: Payer: Self-pay | Admitting: Hematology

## 2015-12-09 ENCOUNTER — Encounter: Payer: Self-pay | Admitting: Hematology

## 2015-12-09 VITALS — BP 111/72 | HR 67 | Temp 98.2°F | Resp 18 | Ht 66.0 in | Wt 161.7 lb

## 2015-12-09 DIAGNOSIS — D472 Monoclonal gammopathy: Secondary | ICD-10-CM

## 2015-12-09 DIAGNOSIS — Z85820 Personal history of malignant melanoma of skin: Secondary | ICD-10-CM | POA: Diagnosis not present

## 2015-12-09 DIAGNOSIS — G629 Polyneuropathy, unspecified: Secondary | ICD-10-CM

## 2015-12-09 LAB — CBC & DIFF AND RETIC
BASO%: 0.4 % (ref 0.0–2.0)
Basophils Absolute: 0 10*3/uL (ref 0.0–0.1)
EOS%: 1.9 % (ref 0.0–7.0)
Eosinophils Absolute: 0.2 10*3/uL (ref 0.0–0.5)
HCT: 34.5 % — ABNORMAL LOW (ref 34.8–46.6)
HGB: 11.5 g/dL — ABNORMAL LOW (ref 11.6–15.9)
Immature Retic Fract: 8.4 % (ref 1.60–10.00)
LYMPH%: 21.8 % (ref 14.0–49.7)
MCH: 32.8 pg (ref 25.1–34.0)
MCHC: 33.3 g/dL (ref 31.5–36.0)
MCV: 98.3 fL (ref 79.5–101.0)
MONO#: 0.7 10*3/uL (ref 0.1–0.9)
MONO%: 8.1 % (ref 0.0–14.0)
NEUT#: 6.2 10*3/uL (ref 1.5–6.5)
NEUT%: 67.8 % (ref 38.4–76.8)
Platelets: 367 10*3/uL (ref 145–400)
RBC: 3.51 10*6/uL — ABNORMAL LOW (ref 3.70–5.45)
RDW: 12.8 % (ref 11.2–14.5)
Retic %: 1.68 % (ref 0.70–2.10)
Retic Ct Abs: 58.97 10*3/uL (ref 33.70–90.70)
WBC: 9.1 10*3/uL (ref 3.9–10.3)
lymph#: 2 10*3/uL (ref 0.9–3.3)

## 2015-12-09 LAB — COMPREHENSIVE METABOLIC PANEL
ALT: 16 U/L (ref 0–55)
AST: 20 U/L (ref 5–34)
Albumin: 3.9 g/dL (ref 3.5–5.0)
Alkaline Phosphatase: 37 U/L — ABNORMAL LOW (ref 40–150)
Anion Gap: 7 mEq/L (ref 3–11)
BUN: 14 mg/dL (ref 7.0–26.0)
CO2: 27 mEq/L (ref 22–29)
Calcium: 9.6 mg/dL (ref 8.4–10.4)
Chloride: 104 mEq/L (ref 98–109)
Creatinine: 0.9 mg/dL (ref 0.6–1.1)
EGFR: 80 mL/min/{1.73_m2} — ABNORMAL LOW (ref >90)
Glucose: 78 mg/dl (ref 70–140)
Potassium: 4.6 mEq/L (ref 3.5–5.1)
Sodium: 138 mEq/L (ref 136–145)
Total Bilirubin: 0.51 mg/dL (ref 0.20–1.20)
Total Protein: 7 g/dL (ref 6.4–8.3)

## 2015-12-09 MED ORDER — PREGABALIN 150 MG PO CAPS: 150.0000 mg | ORAL_CAPSULE | Freq: Two times a day (BID) | ORAL | Status: DC

## 2015-12-09 NOTE — Telephone Encounter (Signed)
Gave pt apt & avs °

## 2015-12-09 NOTE — Progress Notes (Signed)
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 Skeletal survey negative. Bone marrow deferred as per patient choice.  Current Treatment: observation   INTERVAL HISTORY:  Lindsey Rodriguez  Is here for her scheduled 6 month follow-up for IgG lambda MGUS. She reports no new bone pains. No new fatigue. No new anemia, hypercalcemia or change in kidney function.  The patient recently had a total abdominal hysterectomy for vaginal bleeding due to fibroids and significant abdominal discomfort. She also had a bladder lift. Notes that she is quite relieved from a symptom standpoint. Foley's was recently discontinued. She notes that she still has to self cath for the next week.  Notes that she has been on the 56 day diet and has lost some and has  Lost some weight and feels much better. She notes her lyrica dose was increased and feels that this was useful for her fibromyalgia pain.   REVIEW OF SYSTEMS:    10 Point review of systems of done and is negative except as noted above.  . Past Medical History  Diagnosis Date  . Anxiety panic attacks    [name]  . Hypertension   . Kidney stones h/o  . Chronic neck pain   . Chronic back pain   . Plantar fasciitis right  . Tendonitis of foot right  . Melanoma ([place]) RUE    [name]  . Depression   . SVD (spontaneous vaginal delivery)     x 3  . Kidney stone     History  . 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 Hyperlipidemia   . Seasonal allergies   . Migraine     OTC med prn - stress related  . Arthritis     knees  . Fibromyalgia     and neuropathy of feet    . Past Surgical History  Procedure Laterality Date  . Neck surgery  [name]  Summer 2011    X 4   (C4-T1 Level)  . Tonsillectomy    . Rhinoplasty    . Melanoma excision  05/2015    right forearm; [name]  . Back surgery  Dr.Stern, last sx Summer 2011    X 4  (L3-S1)  . Wisdom tooth extraction    . Eye surgery Bilateral     Lasik   . Colonoscopy    . Kidney stone surgery  2011  . Endometrial ablation  08/2000  . Robotic assisted total hysterectomy with salpingectomy Bilateral 11/24/2015    Procedure: ROBOTIC ASSISTED TOTAL HYSTERECTOMY WITH SALPINGECTOMY;  Surgeon: [name], MD;  Location: [facility];  Service: Gynecology;  Laterality: Bilateral;  . Cystoscopy N/A 11/24/2015    Procedure: CYSTOSCOPY;  Surgeon: [name], MD;  Location: [facility];  Service: Gynecology;  Laterality: N/A;  . Robotic assisted bilateral salpingo oopherectomy Bilateral 11/24/2015    Procedure: ROBOTIC ASSISTED BILATERAL SALPINGO OOPHORECTOMY;  Surgeon: [name], MD;  Location: [facility];  Service: Gynecology;  Laterality: Bilateral;  . Bladder suspension N/A 11/24/2015    Procedure: TRANSVAGINAL TAPE (TVT) PROCEDURE;  Surgeon: [name], MD;  Location: [facility];  Service: Gynecology;  Laterality: N/A;  . Ablation on endometriosis N/A 11/24/2015    Procedure: ABLATION ON ENDOMETRIOSIS;  Surgeon: [name], MD;  Location: [facility];  Service: Gynecology;  Laterality: N/A;  . Cystocele repair  11/24/2015    Procedure: ANTERIOR REPAIR (CYSTOCELE);  Surgeon: [name], MD;  Location: [facility];  Service: Gynecology;;    . Social History  Substance Use Topics  . Smoking status: Former Smoker -- 0.10 packs/day    Types: Cigarettes    Quit date: 07/04/1987  . Smokeless tobacco: Never Used  . Alcohol Use: 0.0 oz/week    0 Standard drinks or equivalent per week     Comment: ONCE A MONTH Wine    ALLERGIES:  is allergic to penicillins; codeine; voltaren; and erythromycin.  MEDICATIONS:  Current Outpatient Prescriptions  Medication Sig Dispense Refill  . amLODipine-benazepril (LOTREL)  5-10 MG capsule Take 1 capsule by mouth daily. 90 capsule 1  . B Complex Vitamins (B-COMPLEX/B-12 PO) Take 1 tablet by mouth daily.     [facility] buPROPion (WELLBUTRIN XL) 150 MG 24 hr tablet Take 150 mg by mouth daily.     . Calcium Carb-Cholecalciferol (CALCIUM PLUS VITAMIN D3) 600-800 MG-UNIT TABS Take 1 tablet by mouth daily.    . Cholecalciferol (VITAMIN D) 2000 units tablet Take 2,000 Units by mouth daily.    . divalproex (DEPAKOTE ER) 500 MG 24 hr tablet Take 1 tablet (500 mg total) by mouth daily. (Patient taking differently: Take 500 mg by mouth at bedtime. ) 30 tablet 5  . fenofibrate 160 MG tablet TAKE 1 TABLET (160 MG TOTAL) BY MOUTH DAILY. 90 tablet 0  . fexofenadine (ALLEGRA) 180 MG tablet Take 180 mg by mouth daily.    [facility] FLUoxetine (PROZAC) 40 MG capsule Take 40 mg by mouth daily.     [facility] LORazepam (ATIVAN) 0.5 MG tablet Take 1-2 tablets by mouth 3 (three) times daily as needed. Prn anxiety    . MAGNESIUM MALATE PO Take 1,250 mg by mouth 2 (two) times daily.    . Multiple Vitamins-Minerals (MULTIVITAMIN WITH MINERALS) tablet Take 1 tablet by mouth daily.      . nitrofurantoin, macrocrystal-monohydrate, (MACROBID) 100 MG capsule Take 1 capsule (100 mg total) by mouth 2 (two) times daily. 14 capsule 2  . Omega-3 Fatty Acids (FISH OIL PO) Take 1 capsule by mouth daily.     . pregabalin (LYRICA) 150 MG capsule Take 1 capsule (150 mg total) by mouth 2 (two) times daily. 180 capsule 1  . Probiotic Product (TRUBIOTICS PO) Take 1 capsule by mouth daily.    . Pumpkin Seed-Soy Germ (AZO BLADDER CONTROL/GO-LESS PO) Take 1 capsule by mouth daily.    [facility] tiZANidine (ZANAFLEX) 2 MG tablet Take 2 mg by mouth every 6 (six) hours as needed for muscle spasms.     No current facility-administered medications for this visit.    PHYSICAL EXAMINATION: ECOG PERFORMANCE STATUS: 2  . Filed Vitals:   12/09/15 0830  BP: 111/72  Pulse: 67  Temp: 98.2 F (36.8 C)  Resp: 18    Filed Weights   12/09/15 0830    Weight: 161 lb 11.2 oz (73.347 kg)   .Body mass index is 26.11 kg/(m^2).  GENERAL:alert, in no acute distress and comfortable SKIN: skin color, texture, turgor are normal, no rashes or significant lesions EYES: normal, conjunctiva are pink and non-injected, sclera clear OROPHARYNX:no exudate, no erythema and lips, buccal mucosa, and tongue normal  NECK: supple, no JVD, thyroid normal size, non-tender, without nodularity LYMPH:  no palpable lymphadenopathy in the cervical, axillary or inguinal LUNGS: clear to auscultation with normal respiratory effort HEART: regular  rate & rhythm,  no murmurs and no lower extremity edema ABDOMEN: abdomen soft, non-tender, normoactive bowel sounds  Musculoskeletal: no cyanosis of digits and no clubbing  PSYCH: alert & oriented x 3 with fluent speech NEURO: no focal motor/sensory deficits  LABORATORY DATA:   I have reviewed the data as listed  . CBC Latest Ref Rng 12/09/2015 11/25/2015 11/15/2015  WBC 3.9 - 10.3 10e3/uL 9.1 13.5(H) 6.9  Hemoglobin 11.6 - 15.9 g/dL 11.5(L) 10.4(L) 12.9  Hematocrit 34.8 - 46.6 % 34.5(L) 30.5(L) 37.8  Platelets 145 - 400 10e3/uL 367 287 341    . CMP Latest Ref Rng 12/09/2015 11/25/2015 11/15/2015  Glucose 70 - 140 mg/dl 78 92 86  BUN 7.0 - 26.0 mg/dL 14.0 13 16  Creatinine 0.6 - 1.1 mg/dL 0.9 0.83 0.91  Sodium 136 - 145 mEq/L 138 132(L) 139  Potassium 3.5 - 5.1 mEq/L 4.6 3.9 4.1  Chloride 101 - 111 mmol/L - 102 105  CO2 22 - 29 mEq/L _0 Calcium 8.4 - 10.4 mg/dL 9.6 8.5(L) 10.1  Total Protein 6.4 - 8.3 g/dL 7.0 - -  Total Bilirubin 0.20 - 1.20 mg/dL 0.51 - -  Alkaline Phos 40 - 150 U/L 37(L) - -  AST 5 - 34 U/L 20 - -  ALT 0 - 55 U/L 16 - -    . Lab Results  Component Value Date   TOTALPROTELP 7.7 06/10/2015   ALBUMINELP 4.8 06/10/2015   A1GS 0.2 06/10/2015   A2GS 0.6 06/10/2015   BETS 0.5 06/10/2015   BETA2SER 0.3 06/10/2015   GAMS 1.3 06/10/2015   SPEI * 06/10/2015   (this displays SPEP  labs) Immunofix Electr Int  * *CM   Comments: Monoclonal IgG lambda protein is present.        Comments: A restricted band consistent with monoclonal protein is present.  The monoclonal protein peak accounts for 1.0 g/dL of the total  1.3 g/dL of protein in the gamma region.  Results are consistent with SPE performed on 03/15/2015.   Lab Results  Component Value Date   KPAFRELGTCHN 1.29 06/10/2015   LAMBDASER 0.96 06/10/2015   KAPLAMBRATIO 1.34 06/10/2015   (kappa/lambda light chains)          RADIOGRAPHIC STUDIES: I have personally reviewed the radiological images as listed and agreed with the findings in the report. No results found.  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 Previous UPEP and skeletal survey negative. No evidence of anemia, renal failure on labs. Mild hypercalcemia likely due to dehydration. No new bone pain. previous SPEP showed stable M-spike at 1g/dl with no significant increase. Her CBC shows no concerning changes, CMP shows no hypercalcemia or renal insufficiency. Plan -no indication for Bm Bx or other additional intervention at this time. -Will follow-up SPEP results from today -will recheck SPEP and f/u patient in 75month with rpt cbc, cmp and SPEP.  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 Plan -Continue management per dermatology and ongoing skin surveillance. -counselld on sun protection.  Continue followup with primary care physician and rheumatology.  RTC with Dr KIrene Limboin 683monthwith cbc, cmp and SPEP  I spent 15 minutes counseling the patient face to face. The total time spent in the appointment was 20 minutes and more  than 50% was on counseling and direct patient  cares.    Sullivan Lone MD [place] AAHIVMS [facility] Hematology/Oncology Physician [facility]  (Office):       [phone] (Work cell):  [phone] (Fax):           [phone]

## 2015-12-13 LAB — MULTIPLE MYELOMA PANEL, SERUM
Albumin SerPl Elph-Mcnc: 3.9 g/dL (ref 2.9–4.4)
Albumin/Glob SerPl: 1.4 (ref 0.7–1.7)
Alpha 1: 0.2 g/dL (ref 0.0–0.4)
Alpha2 Glob SerPl Elph-Mcnc: 0.5 g/dL (ref 0.4–1.0)
B-Globulin SerPl Elph-Mcnc: 1.1 g/dL (ref 0.7–1.3)
Gamma Glob SerPl Elph-Mcnc: 1.2 g/dL (ref 0.4–1.8)
Globulin, Total: 3 g/dL (ref 2.2–3.9)
IgA, Qn, Serum: 87 mg/dL (ref 87–352)
IgG, Qn, Serum: 1147 mg/dL (ref 700–1600)
IgM, Qn, Serum: 56 mg/dL (ref 26–217)
M Protein SerPl Elph-Mcnc: 0.8 g/dL — ABNORMAL HIGH
Total Protein: 6.9 g/dL (ref 6.0–8.5)

## 2015-12-16 ENCOUNTER — Encounter: Payer: Self-pay | Admitting: Hematology

## 2015-12-17 ENCOUNTER — Emergency Department (HOSPITAL_COMMUNITY)
Admission: EM | Admit: 2015-12-17 | Discharge: 2015-12-18 | Disposition: A | Discharge status: Other Acute Inpatient Hospital | Payer: Medicare Other | Attending: Emergency Medicine | Admitting: Emergency Medicine

## 2015-12-17 ENCOUNTER — Encounter (HOSPITAL_COMMUNITY): Payer: Self-pay | Admitting: *Deleted

## 2015-12-17 ENCOUNTER — Encounter (HOSPITAL_COMMUNITY): Payer: Self-pay | Admitting: Emergency Medicine

## 2015-12-17 ENCOUNTER — Emergency Department (HOSPITAL_COMMUNITY): Payer: Medicare Other

## 2015-12-17 ENCOUNTER — Inpatient Hospital Stay (HOSPITAL_COMMUNITY)
Admission: AD | Admit: 2015-12-17 | Discharge: 2015-12-17 | Disposition: A | Discharge status: Home / Self Care | Payer: Medicare Other | Source: Intra-hospital | Attending: Obstetrics & Gynecology | Admitting: Obstetrics & Gynecology

## 2015-12-17 DIAGNOSIS — E785 Hyperlipidemia, unspecified: Secondary | ICD-10-CM | POA: Insufficient documentation

## 2015-12-17 DIAGNOSIS — Z79899 Other long term (current) drug therapy: Secondary | ICD-10-CM | POA: Diagnosis not present

## 2015-12-17 DIAGNOSIS — I10 Essential (primary) hypertension: Secondary | ICD-10-CM | POA: Insufficient documentation

## 2015-12-17 DIAGNOSIS — R1030 Lower abdominal pain, unspecified: Secondary | ICD-10-CM

## 2015-12-17 DIAGNOSIS — M170 Bilateral primary osteoarthritis of knee: Secondary | ICD-10-CM | POA: Diagnosis not present

## 2015-12-17 DIAGNOSIS — Z87891 Personal history of nicotine dependence: Secondary | ICD-10-CM | POA: Insufficient documentation

## 2015-12-17 DIAGNOSIS — F319 Bipolar disorder, unspecified: Secondary | ICD-10-CM | POA: Diagnosis not present

## 2015-12-17 DIAGNOSIS — Z90710 Acquired absence of both cervix and uterus: Secondary | ICD-10-CM | POA: Insufficient documentation

## 2015-12-17 DIAGNOSIS — R188 Other ascites: Secondary | ICD-10-CM | POA: Diagnosis not present

## 2015-12-17 DIAGNOSIS — R102 Pelvic and perineal pain: Secondary | ICD-10-CM | POA: Diagnosis present

## 2015-12-17 LAB — URINE MICROSCOPIC-ADD ON

## 2015-12-17 LAB — URINALYSIS, ROUTINE W REFLEX MICROSCOPIC
Bilirubin Urine: NEGATIVE
Glucose, UA: NEGATIVE mg/dL
Ketones, ur: NEGATIVE mg/dL
Leukocytes, UA: NEGATIVE
Nitrite: NEGATIVE
Protein, ur: NEGATIVE mg/dL
Specific Gravity, Urine: <1.005 — ABNORMAL LOW (ref 1.005–1.030)
pH: 6 (ref 5.0–8.0)

## 2015-12-17 LAB — CBC WITH DIFFERENTIAL/PLATELET
Basophils Absolute: 0 10*3/uL (ref 0.0–0.1)
Basophils Relative: 0 %
Eosinophils Absolute: 0 10*3/uL (ref 0.0–0.7)
Eosinophils Relative: 0 %
HCT: 36.8 % (ref 36.0–46.0)
Hemoglobin: 12.4 g/dL (ref 12.0–15.0)
Lymphocytes Relative: 25 %
Lymphs Abs: 2.5 10*3/uL (ref 0.7–4.0)
MCH: 32.7 pg (ref 26.0–34.0)
MCHC: 33.7 g/dL (ref 30.0–36.0)
MCV: 97.1 fL (ref 78.0–100.0)
Monocytes Absolute: 0.7 10*3/uL (ref 0.1–1.0)
Monocytes Relative: 7 %
Neutro Abs: 6.8 10*3/uL (ref 1.7–7.7)
Neutrophils Relative %: 68 %
Platelets: 463 10*3/uL — ABNORMAL HIGH (ref 150–400)
RBC: 3.79 MIL/uL — ABNORMAL LOW (ref 3.87–5.11)
RDW: 13 % (ref 11.5–15.5)
WBC: 10.1 10*3/uL (ref 4.0–10.5)

## 2015-12-17 LAB — BASIC METABOLIC PANEL
Anion gap: 8 (ref 5–15)
BUN: 18 mg/dL (ref 6–20)
CO2: 26 mmol/L (ref 22–32)
Calcium: 9.8 mg/dL (ref 8.9–10.3)
Chloride: 104 mmol/L (ref 101–111)
Creatinine, Ser: 0.72 mg/dL (ref 0.44–1.00)
GFR calc Af Amer: >60 mL/min (ref >60)
GFR calc non Af Amer: >60 mL/min (ref >60)
Glucose, Bld: 85 mg/dL (ref 65–99)
Potassium: 4.2 mmol/L (ref 3.5–5.1)
Sodium: 138 mmol/L (ref 135–145)

## 2015-12-17 MED ORDER — DEXTROSE 5 % IV SOLN
1.0000 g | Freq: Once | INTRAVENOUS | Status: AC
  Administered 2015-12-17: 1 g via INTRAVENOUS
  Filled 2015-12-17: qty 10

## 2015-12-17 MED ORDER — IOPAMIDOL (ISOVUE-300) INJECTION 61%
100.0000 mL | Freq: Once | INTRAVENOUS | Status: AC | PRN
  Administered 2015-12-17: 100 mL via INTRAVENOUS

## 2015-12-17 MED ORDER — METRONIDAZOLE IN NACL 5-0.79 MG/ML-% IV SOLN
500.0000 mg | Freq: Once | INTRAVENOUS | Status: AC
  Administered 2015-12-17: 500 mg via INTRAVENOUS
  Filled 2015-12-17: qty 100

## 2015-12-17 NOTE — Progress Notes (Signed)
Radiology paged.  Waiting for return phone call.

## 2015-12-17 NOTE — ED Notes (Signed)
Pt was sent from [facility] due to their CT machine being down. Pt had a hysterectomy on May 24th and has had urinary retention since. Pt was sent home to self cath then returned and was given a foley. Pt has experienced blood in her urine and a sharp pain and constant pressure in her pelvis.

## 2015-12-17 NOTE — MAU Provider Note (Signed)
History     CSN: [id number]  Arrival date and time: 12/17/15 1355   None     Chief Complaint  Patient presents with  . Cystitis   HPI Lindsey Rodriguez is 50 y.o. Lindsey Rodriguez presents for evaluation of lower abdominal pain.  She is a patient of [name].  Hx of robotic assisted total hysterectomy, BSO, TVT and ablation of endometriosis on  5/24.  She presented 3rd post operative day to MAU with foley catheter problems, "balloon wasn't inflated enough to hold in place".  Did well after that visit.  Foley was removed 6/6. Tried urine trial without success.  Taught self cath.  From 6/6-6/14. Had cath in office with residual urine of 900cc.  Foley was inserted.  Began with pain last night, worsened this am.  She saw blood clots in tubing this am.  Contacted office and told to come in for CT scan and urology consult.  Feels flushed but hasn't taken temperature at home because it "is always sub- normal".  Rates pain as a 7/10 at present and at its worse 8.5/10 this am, down to 4.5/10 and now at 7/10. Is taking AZO tid.  States no pain or problems with the hysterectomy "part" of her surgery            .     Past Medical History  Diagnosis Date  . Anxiety panic attacks    [name]  . Hypertension   . Kidney stones h/o  . Chronic neck pain   . Chronic back pain   . Plantar fasciitis right  . Tendonitis of foot right  . Melanoma ([place]) RUE    [name]  . Depression   . SVD (spontaneous vaginal delivery)     x 3  . Kidney stone     History  . 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 Hyperlipidemia   . Seasonal allergies   . Migraine     OTC med prn - stress related  . Arthritis     knees  . Fibromyalgia     and neuropathy of feet    Past Surgical History  Procedure Laterality Date  . Neck surgery  [name]  Summer 2011    X 4  (C4-T1 Level)  . Tonsillectomy    . Rhinoplasty    . Melanoma excision   05/2015    right forearm; [name]  . Back surgery  Dr.Stern, last sx Summer 2011    X 4  (L3-S1)  . Wisdom tooth extraction    . Eye surgery Bilateral     Lasik   . Colonoscopy    . Kidney stone surgery  2011  . Endometrial ablation  08/2000  . Robotic assisted total hysterectomy with salpingectomy Bilateral 11/24/2015    Procedure: ROBOTIC ASSISTED TOTAL HYSTERECTOMY WITH SALPINGECTOMY;  Surgeon: [name], MD;  Location: [facility];  Service: Gynecology;  Laterality: Bilateral;  . Cystoscopy N/A 11/24/2015    Procedure: CYSTOSCOPY;  Surgeon: [name], MD;  Location: [facility];  Service: Gynecology;  Laterality: N/A;  . Robotic assisted bilateral salpingo oopherectomy Bilateral 11/24/2015    Procedure: ROBOTIC ASSISTED BILATERAL SALPINGO OOPHORECTOMY;  Surgeon: [name], MD;  Location: [facility];  Service: Gynecology;  Laterality: Bilateral;  . Bladder suspension N/A 11/24/2015    Procedure: TRANSVAGINAL TAPE (TVT) PROCEDURE;  Surgeon: [name], MD;  Location: [facility];  Service: Gynecology;  Laterality: N/A;  .  Ablation on endometriosis N/A 11/24/2015    Procedure: ABLATION ON ENDOMETRIOSIS;  Surgeon: [name], MD;  Location: [facility];  Service: Gynecology;  Laterality: N/A;  . Cystocele repair  11/24/2015    Procedure: ANTERIOR REPAIR (CYSTOCELE);  Surgeon: [name], MD;  Location: [facility];  Service: Gynecology;;    Family History  Problem Relation Age of Onset  . Hypertension Mother   . Depression Mother   . Migraines Mother   . Hypothyroidism Mother   . COPD Father   . Heart failure Father 69  . Breast cancer Cousin     40's  . Cancer Cousin     breast; in her 73's  . Diabetes Neg Hx     Social History  Substance Use Topics  . Smoking status: Former Smoker -- 0.10 packs/day    Types: Cigarettes    Quit date: 07/04/1987  . Smokeless tobacco: Never Used  . Alcohol Use: 0.0 oz/week    0 Standard drinks or equivalent per week     Comment:  ONCE A MONTH Wine    Allergies:  Allergies  Allergen Reactions  . Penicillins Shortness Of Breath and Rash    Has patient had a PCN reaction causing immediate rash, facial/tongue/throat swelling, SOB or lightheadedness with hypotension: no Has patient had a PCN reaction causing severe rash involving mucus membranes or skin necrosis: no Has patient had a PCN reaction that required hospitalization no Has patient had a PCN reaction occurring within the last 10 years: no If all of the above answers are "NO", then may proceed with Cephalosporin use.   . Codeine Other (See Comments)    Manic depressive  . Voltaren [Diclofenac] Nausea Only and Other (See Comments)    Pt stated "tore stomach up"  . Erythromycin Rash    Prescriptions prior to admission  Medication Sig Dispense Refill Last Dose  . amLODipine-benazepril (LOTREL) 5-10 MG capsule Take 1 capsule by mouth daily. 90 capsule 1 Taking  . B Complex Vitamins (B-COMPLEX/B-12 PO) Take 1 tablet by mouth daily.    Taking  . buPROPion (WELLBUTRIN XL) 150 MG 24 hr tablet Take 150 mg by mouth daily.    Taking  . Calcium Carb-Cholecalciferol (CALCIUM PLUS VITAMIN D3) 600-800 MG-UNIT TABS Take 1 tablet by mouth daily.   Taking  . Cholecalciferol (VITAMIN D) 2000 units tablet Take 2,000 Units by mouth daily.   Taking  . divalproex (DEPAKOTE ER) 500 MG 24 hr tablet Take 1 tablet (500 mg total) by mouth daily. (Patient taking differently: Take 500 mg by mouth at bedtime. ) 30 tablet 5 Taking  . fenofibrate 160 MG tablet TAKE 1 TABLET (160 MG TOTAL) BY MOUTH DAILY. 90 tablet 0 Taking  . fexofenadine (ALLEGRA) 180 MG tablet Take 180 mg by mouth daily.   Taking  . FLUoxetine (PROZAC) 40 MG capsule Take 40 mg by mouth daily.    Taking  . LORazepam (ATIVAN) 0.5 MG tablet Take 1-2 tablets by mouth 3 (three) times daily as needed. Prn anxiety   Taking  . MAGNESIUM MALATE PO Take 1,250 mg by mouth 2 (two) times daily.   Taking  . Multiple Vitamins-Minerals  (MULTIVITAMIN WITH MINERALS) tablet Take 1 tablet by mouth daily.     Taking  . nitrofurantoin, macrocrystal-monohydrate, (MACROBID) 100 MG capsule Take 1 capsule (100 mg total) by mouth 2 (two) times daily. 14 capsule 2 Taking  . Omega-3 Fatty Acids (FISH OIL PO) Take 1 capsule by mouth daily.    Taking  .  pregabalin (LYRICA) 150 MG capsule Take 1 capsule (150 mg total) by mouth 2 (two) times daily. 180 capsule 1   . Probiotic Product (TRUBIOTICS PO) Take 1 capsule by mouth daily.   Taking  . Pumpkin Seed-Soy Germ (AZO BLADDER CONTROL/GO-LESS PO) Take 1 capsule by mouth daily.   Taking  . tiZANidine (ZANAFLEX) 2 MG tablet Take 2 mg by mouth every 6 (six) hours as needed for muscle spasms.   Taking    Review of Systems  Constitutional: Negative for fever and chills.  Gastrointestinal: Positive for abdominal pain. Negative for nausea and vomiting (lower abdominal /suprapubic/urinary pain).  Genitourinary:       Has foley catheter-report seeing blood clots in tubing   Physical Exam   Blood pressure 136/64, pulse 66, temperature 97.8 F (36.6 C), temperature source Oral, resp. rate 18, weight 161 lb 3.2 oz (73.12 kg), SpO2 99 %.  Physical Exam  Constitutional: She is oriented to person, place, and time. She appears well-developed and well-nourished. No distress.  HENT:  Head: Normocephalic.  Neck: Normal range of motion.  Cardiovascular: Normal rate.   Genitourinary:  Foley catheter is draining clear yellow urine without blood clots at this time-urine last emptied at 1pm.   Neurological: She is alert and oriented to person, place, and time.  Skin: Skin is warm and dry.  Psychiatric: She has a normal mood and affect. Her behavior is normal. Thought content normal.   Results for orders placed or performed during the hospital encounter of 12/17/15 (from the past 24 hour(s))  Urinalysis, Routine w reflex microscopic (not at [facility])     Status: Abnormal   Collection Time: 12/17/15  4:10 PM   Result Value Ref Range   Color, Urine YELLOW YELLOW   APPearance CLEAR CLEAR   Specific Gravity, Urine <1.005 (L) 1.005 - 1.030   pH 6.0 5.0 - 8.0   Glucose, UA NEGATIVE NEGATIVE mg/dL   Hgb urine dipstick TRACE (A) NEGATIVE   Bilirubin Urine NEGATIVE NEGATIVE   Ketones, ur NEGATIVE NEGATIVE mg/dL   Protein, ur NEGATIVE NEGATIVE mg/dL   Nitrite NEGATIVE NEGATIVE   Leukocytes, UA NEGATIVE NEGATIVE  Urine microscopic-add on     Status: Abnormal   Collection Time: 12/17/15  4:10 PM  Result Value Ref Range   Squamous Epithelial / LPF 0-5 (A) NONE SEEN   WBC, UA 0-5 0 - 5 WBC/hpf   RBC / HPF 0-5 0 - 5 RBC/hpf   Bacteria, UA RARE (A) NONE SEEN   MAU Course  Procedures     MDM MSE Consulted with Dr. Alwyn Pea.  Order given for CT, IVP to look at ureters.  CT machine at [facility] is down.  Dr. Alwyn Pea and the patient is aware CT will be done at [facility].  Patient offered [facility] or her husband to drive her.  She is not acutely ill.  Instructions in the CT order is for the radiologist to call Dr. Alwyn Pea with results before the patient leaves.    Assessment and Plan  A:  Post operative pain      Foley catheter in place      Hx of urinary retention  P: discharged with instructions to go to [facility] ED     She is aware Dr. Alwyn Pea will be called with results   Shemiah Rosch,EVE M 12/17/2015, 3:05 PM

## 2015-12-17 NOTE — MAU Note (Signed)
Patient had hysterectomy 3 weeks ago and had foley catheter placed for 2 weeks.  Had it removed for 1 week and replaced again on Wednesday.  Pt presents c/o cramping, sharp pains, and bleeding/clots in foley tubing.  Also states feeling like she might have a fever, but did not check her temperature.

## 2015-12-17 NOTE — ED Provider Notes (Addendum)
CSN: [id number]     Arrival date & time 12/17/15  1652 History   First MD Initiated Contact with Patient 12/17/15 1845     Chief Complaint  Patient presents with  . Pelvic Pain     (Consider location/radiation/quality/duration/timing/severity/associated sxs/prior Treatment) Patient is a 50 y.o. female presenting with pelvic pain. The history is provided by the patient.  Pelvic Pain This is a new problem. The current episode started 2 days ago. The problem occurs constantly. The problem has been gradually worsening. Associated symptoms include abdominal pain. Associated symptoms comments: Chills, nausea and pelvic pain radiating to the left back.  Pt has foley cath since her robotic hysterectomy due to urinary retention.  Over the last 2 days has had intermittent hematuria but draining well.  [facility] and [facility] here for CT due to their scanner being down.. The symptoms are aggravated by bending. The symptoms are relieved by rest. The treatment provided no relief.    Past Medical History  Diagnosis Date  . Anxiety panic attacks    [name]  . Hypertension   . Kidney stones h/o  . Chronic neck pain   . Chronic back pain   . Plantar fasciitis right  . Tendonitis of foot right  . Melanoma ([place]) RUE    [name]  . Depression   . SVD (spontaneous vaginal delivery)     x 3  . Kidney stone     History  . 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 Hyperlipidemia   . Seasonal allergies   . Migraine     OTC med prn - stress related  . Arthritis     knees  . Fibromyalgia     and neuropathy of feet   Past Surgical History  Procedure Laterality Date  . Neck surgery  [name]  Summer 2011    X 4  (C4-T1 Level)  . Tonsillectomy    . Rhinoplasty    . Melanoma excision  05/2015    right forearm; [name]  . Back surgery  Dr.Stern, last sx Summer 2011    X 4  (L3-S1)  . Wisdom tooth extraction     . Eye surgery Bilateral     Lasik   . Colonoscopy    . Kidney stone surgery  2011  . Endometrial ablation  08/2000  . Robotic assisted total hysterectomy with salpingectomy Bilateral 11/24/2015    Procedure: ROBOTIC ASSISTED TOTAL HYSTERECTOMY WITH SALPINGECTOMY;  Surgeon: [name], MD;  Location: [facility];  Service: Gynecology;  Laterality: Bilateral;  . Cystoscopy N/A 11/24/2015    Procedure: CYSTOSCOPY;  Surgeon: [name], MD;  Location: [facility];  Service: Gynecology;  Laterality: N/A;  . Robotic assisted bilateral salpingo oopherectomy Bilateral 11/24/2015    Procedure: ROBOTIC ASSISTED BILATERAL SALPINGO OOPHORECTOMY;  Surgeon: [name], MD;  Location: [facility];  Service: Gynecology;  Laterality: Bilateral;  . Bladder suspension N/A 11/24/2015    Procedure: TRANSVAGINAL TAPE (TVT) PROCEDURE;  Surgeon: [name], MD;  Location: [facility];  Service: Gynecology;  Laterality: N/A;  . Ablation on endometriosis N/A 11/24/2015    Procedure: ABLATION ON ENDOMETRIOSIS;  Surgeon: [name], MD;  Location: [facility];  Service: Gynecology;  Laterality: N/A;  . Cystocele repair  11/24/2015    Procedure: ANTERIOR REPAIR (CYSTOCELE);  Surgeon: [name], MD;  Location: [facility];  Service: Gynecology;;   Family History  Problem Relation Age of  Onset  . Hypertension Mother   . Depression Mother   . Migraines Mother   . Hypothyroidism Mother   . COPD Father   . Heart failure Father 21  . Breast cancer Cousin     40's  . Cancer Cousin     breast; in her 47's  . Diabetes Neg Hx    Social History  Substance Use Topics  . Smoking status: Former Smoker -- 0.10 packs/day    Types: Cigarettes    Quit date: 07/04/1987  . Smokeless tobacco: Never Used  . Alcohol Use: 0.0 oz/week    0 Standard drinks or equivalent per week     Comment: ONCE A MONTH Wine   OB History    Gravida Para Term Preterm AB TAB SAB Ectopic Multiple Living   3 3        3      Review of Systems    Gastrointestinal: Positive for abdominal pain.  Genitourinary: Positive for pelvic pain.  All other systems reviewed and are negative.     Allergies  Penicillins; Codeine; Voltaren; and Erythromycin  Home Medications   Prior to Admission medications   Medication Sig Start Date End Date Taking? Authorizing Provider  amLODipine-benazepril (LOTREL) 5-10 MG capsule Take 1 capsule by mouth daily. 07/28/15  Yes Rita Ohara, MD  B Complex Vitamins (B-COMPLEX/B-12 PO) Take 1 tablet by mouth daily.    Yes Historical Provider, MD  buPROPion (WELLBUTRIN XL) 150 MG 24 hr tablet Take 150 mg by mouth daily.  10/30/14  Yes Historical Provider, MD  Calcium Carb-Cholecalciferol (CALCIUM PLUS VITAMIN D3) 600-800 MG-UNIT TABS Take 1 tablet by mouth daily.   Yes Historical Provider, MD  Cholecalciferol (VITAMIN D) 2000 units tablet Take 2,000 Units by mouth daily.   Yes Historical Provider, MD  divalproex (DEPAKOTE ER) 500 MG 24 hr tablet Take 1 tablet (500 mg total) by mouth daily. Patient taking differently: Take 500 mg by mouth at bedtime.  08/04/15  Yes Adam Telford Nab, DO  fenofibrate 160 MG tablet TAKE 1 TABLET (160 MG TOTAL) BY MOUTH DAILY. 10/21/15  Yes Rita Ohara, MD  fexofenadine (ALLEGRA) 180 MG tablet Take 180 mg by mouth daily.   Yes Historical Provider, MD  FLUoxetine (PROZAC) 40 MG capsule Take 40 mg by mouth daily.  10/25/14  Yes Historical Provider, MD  LORazepam (ATIVAN) 0.5 MG tablet Take 1-2 tablets by mouth 3 (three) times daily as needed. Prn anxiety 01/20/13  Yes Historical Provider, MD  MAGNESIUM MALATE PO Take 1,250 mg by mouth 2 (two) times daily.   Yes Historical Provider, MD  Multiple Vitamins-Minerals (MULTIVITAMIN WITH MINERALS) tablet Take 1 tablet by mouth daily.     Yes Historical Provider, MD  nitrofurantoin, macrocrystal-monohydrate, (MACROBID) 100 MG capsule Take 1 capsule (100 mg total) by mouth 2 (two) times daily. 11/25/15  Yes [name], MD  Omega-3 Fatty Acids (FISH OIL  PO) Take 1 capsule by mouth daily.    Yes Historical Provider, MD  Phenazopyridine HCl (AZO STANDARD MAXIMUM STRENGTH PO) Take 1 tablet by mouth 3 (three) times daily.   Yes Historical Provider, MD  pregabalin (LYRICA) 150 MG capsule Take 1 capsule (150 mg total) by mouth 2 (two) times daily. 12/09/15  Yes Rita Ohara, MD  Probiotic Product (TRUBIOTICS PO) Take 1 capsule by mouth daily.   Yes Historical Provider, MD  tiZANidine (ZANAFLEX) 2 MG tablet Take 2 mg by mouth every 6 (six) hours as needed for muscle spasms.   Yes Historical  Provider, MD   BP 122/66 mmHg  Pulse 72  Temp(Src) 98.6 F (37 C) (Oral)  Resp 15  SpO2 97%  LMP 12/16/2012 Physical Exam  Constitutional: She is oriented to person, place, and time. She appears well-developed and well-nourished. No distress.  HENT:  Head: Normocephalic and atraumatic.  Mouth/Throat: Oropharynx is clear and moist.  Eyes: Conjunctivae and EOM are normal. Pupils are equal, round, and reactive to light.  Neck: Normal range of motion. Neck supple.  Cardiovascular: Normal rate, regular rhythm and intact distal pulses.   No murmur heard. Pulmonary/Chest: Effort normal and breath sounds normal. No respiratory distress. She has no wheezes. She has no rales.  Abdominal: Soft. She exhibits no distension. There is tenderness in the suprapubic area. There is guarding and CVA tenderness. There is no rebound.    Left CVA tenderness  Musculoskeletal: Normal range of motion. She exhibits no edema or tenderness.  Neurological: She is alert and oriented to person, place, and time.  Skin: Skin is warm and dry. No rash noted. No erythema.  Psychiatric: She has a normal mood and affect. Her behavior is normal.  Nursing note and vitals reviewed.   ED Course  Procedures (including critical care time) Labs Review Labs Reviewed  CBC WITH DIFFERENTIAL/PLATELET - Abnormal; Notable for the following:    RBC 3.79 (*)    Platelets 463 (*)    All other components  within normal limits  BASIC METABOLIC PANEL    Imaging Review Ct Abdomen Pelvis W Contrast  12/17/2015  CLINICAL DATA:  Pelvic pain and urine retention status post hysterectomy on Nov 24, 2015. EXAM: CT ABDOMEN AND PELVIS WITH CONTRAST TECHNIQUE: Multidetector CT imaging of the abdomen and pelvis was performed using the standard protocol following bolus administration of intravenous contrast. CONTRAST:  121mL ISOVUE-300 IOPAMIDOL (ISOVUE-300) INJECTION 61% COMPARISON:  None. FINDINGS: Lower chest:  No acute findings. Hepatobiliary: No masses or other significant abnormality. Pancreas: No mass, inflammatory changes, or other significant abnormality. Spleen: Within normal limits in size and appearance. Adrenals/Urinary Tract: No masses identified. There is a 4.3 mm nonobstructing right lower pole renal calculus. Bilateral renal cysts are noted. There is mild cortical thinning of the lower pole of the left kidney with dystrophic appearing calcifications, probably sequela of prior infection. There is mild left pelviectasis. The urinary bladder opacifies with contrast on the delayed images and demonstrates normal contour for its level of distention. Urinary Foley is in place. There are 2 adjacent rim enhancing fluid collections in the left anterior pelvis anterior to and abutting the urinary bladder. These measure 3.1 and 2.9 cm. No definite communication between these collections at the urinary bladder is identified. Stomach/Bowel: No evidence of obstruction, inflammatory process, or abnormal fluid collections. Vascular/Lymphatic: No pathologically enlarged lymph nodes. No evidence of abdominal aortic aneurysm. Reproductive: No mass or other significant abnormality, post hysterectomy. Other: None. Musculoskeletal: No suspicious bone lesions identified. Lower lumbosacral spine posterior fusion with intact hardware is noted. IMPRESSION: Normal appearance of the urinary bladder containing Foley catheter. Two adjacent  rim enhancing fluid collection in the anterior left pelvis which about the urinary bladder without definite communication withit. Given relatively recent hysterectomy, these may represent postsurgical, potentially infected, collections. Cortical thinning of the lower pole of the left kidney with coarse dystrophic appearing calcifications, likely sequela of prior infection. Mild left pelviectasis. Bilateral renal cysts.  Nonobstructing 4 mm right renal calculus. Electronically Signed   By: Fidela Salisbury M.D.   On: 12/17/2015 21:29   I  have personally reviewed and evaluated these images and lab results as part of my medical decision-making.   EKG Interpretation None      MDM   Final diagnoses:  Intraabdominal fluid collection    Patient is a 50 year old female with recent robotic hysterectomy 3 weeks ago who has had ongoing urinary retention requiring Foley catheter since surgery. She had the Foley catheter initially placed for 2 weeks and that was removed and she was attempting to self cath however because of persistent retention her catheter was replaced 2 days ago. For the last 2 days she's had intermittent hematuria and has developed worsening suprapubic tenderness, chills and nausea. She was seen at [facility] hospital today and they wanted to do a CT scan for further evaluation however their scanner was down and she was sent here for further evaluation. She had a urine done at [facility] which showed no signs of infection and trace hematuria. She has suprapubic tenderness and left CVA tenderness on exam but vital signs are within normal limits. CT with IV contrast ordered to evaluate for any type of ureteral obstruction or postsurgical complication. CBC and BMP pending  9:49 PM WBC up to 10 from 4.  BMP wnl.  CT today showing 2 rim-enhancing fluid collections in the anterior left pelvis which could be potential infected fluid collections. Given patient has a normal UA and her symptoms  of lower abdominal pain more so on the left side with these findings concerning for infection. Will discuss with OB/GYN.  10:37 PM Discussed result with Dr. Alwyn Pea who [facility]  She was given rocephin and flagyl due to severe pcn allergy contraindicating zosyn.  She has had keflex before and had no problems.  Blanchie Dessert, MD 12/17/15 [id number]  Blanchie Dessert, MD 12/17/15 2238

## 2015-12-17 NOTE — Discharge Instructions (Signed)

## 2015-12-18 ENCOUNTER — Observation Stay (HOSPITAL_COMMUNITY)
Admission: AD | Admit: 2015-12-18 | Discharge: 2015-12-18 | Disposition: A | Discharge status: Home / Self Care | Payer: Medicare Other | Source: Intra-hospital | Attending: Obstetrics & Gynecology | Admitting: Obstetrics & Gynecology

## 2015-12-18 DIAGNOSIS — F419 Anxiety disorder, unspecified: Secondary | ICD-10-CM | POA: Insufficient documentation

## 2015-12-18 DIAGNOSIS — Z85820 Personal history of malignant melanoma of skin: Secondary | ICD-10-CM | POA: Insufficient documentation

## 2015-12-18 DIAGNOSIS — G8929 Other chronic pain: Secondary | ICD-10-CM

## 2015-12-18 DIAGNOSIS — Z87891 Personal history of nicotine dependence: Secondary | ICD-10-CM

## 2015-12-18 DIAGNOSIS — Z880 Allergy status to penicillin: Secondary | ICD-10-CM

## 2015-12-18 DIAGNOSIS — Z90710 Acquired absence of both cervix and uterus: Secondary | ICD-10-CM

## 2015-12-18 DIAGNOSIS — R339 Retention of urine, unspecified: Secondary | ICD-10-CM | POA: Insufficient documentation

## 2015-12-18 DIAGNOSIS — I10 Essential (primary) hypertension: Secondary | ICD-10-CM

## 2015-12-18 DIAGNOSIS — T8140XA Infection following a procedure, unspecified, initial encounter: Secondary | ICD-10-CM | POA: Diagnosis present

## 2015-12-18 DIAGNOSIS — G8918 Other acute postprocedural pain: Secondary | ICD-10-CM | POA: Insufficient documentation

## 2015-12-18 DIAGNOSIS — M797 Fibromyalgia: Secondary | ICD-10-CM | POA: Insufficient documentation

## 2015-12-18 DIAGNOSIS — G629 Polyneuropathy, unspecified: Secondary | ICD-10-CM | POA: Insufficient documentation

## 2015-12-18 LAB — CBC
HCT: 32.8 % — ABNORMAL LOW (ref 36.0–46.0)
Hemoglobin: 11.1 g/dL — ABNORMAL LOW (ref 12.0–15.0)
MCH: 32.5 pg (ref 26.0–34.0)
MCHC: 33.8 g/dL (ref 30.0–36.0)
MCV: 95.9 fL (ref 78.0–100.0)
Platelets: 387 10*3/uL (ref 150–400)
RBC: 3.42 MIL/uL — ABNORMAL LOW (ref 3.87–5.11)
RDW: 13.4 % (ref 11.5–15.5)
WBC: 8.1 10*3/uL (ref 4.0–10.5)

## 2015-12-18 MED ORDER — CEFAZOLIN SODIUM-DEXTROSE 2-4 GM/100ML-% IV SOLN
2.0000 g | Freq: Three times a day (TID) | INTRAVENOUS | Status: DC
  Administered 2015-12-18: 2 g via INTRAVENOUS
  Filled 2015-12-18 (×2): qty 100

## 2015-12-18 MED ORDER — LACTATED RINGERS IV SOLN
INTRAVENOUS | Status: DC
  Administered 2015-12-18: 02:00:00 via INTRAVENOUS

## 2015-12-18 MED ORDER — LORAZEPAM 0.5 MG PO TABS: 0.5000 mg | ORAL_TABLET | Freq: Three times a day (TID) | ORAL | Status: AC | PRN

## 2015-12-18 MED ORDER — PHENAZOPYRIDINE HCL 100 MG PO TABS: 100.0000 mg | ORAL_TABLET | Freq: Three times a day (TID) | ORAL | Status: DC

## 2015-12-18 MED ORDER — PHENAZOPYRIDINE HCL 100 MG PO TABS
100.0000 mg | ORAL_TABLET | Freq: Three times a day (TID) | ORAL | Status: DC
  Administered 2015-12-18: 100 mg via ORAL
  Filled 2015-12-18 (×2): qty 1

## 2015-12-18 MED ORDER — LORAZEPAM 1 MG PO TABS
0.5000 mg | ORAL_TABLET | Freq: Three times a day (TID) | ORAL | Status: DC | PRN
  Administered 2015-12-18: 0.5 mg via ORAL
  Filled 2015-12-18: qty 1

## 2015-12-18 NOTE — Progress Notes (Signed)
Pharmacy Antibiotic Note  Lindsey Rodriguez is a 50 y.o. female s/p robotic hysterectomy 11/24/15, admitted on 12/18/2015 with intermittent hematuria, suprapubic pain, nausea, and chills. Ct shows fluid collection in pelvis concerning for infection. Pharmacy has been consulted for cefazolin dosing.  Plan: Cefazolin 2 Gm IV every 8 hours Pharmacy will monitor renal function per protocol  Weight: 158 lb (71.668 kg)  Temp (24hrs), Avg:98.1 F (36.7 C), Min:97.8 F (36.6 C), Max:98.6 F (37 C)   Recent Labs Lab 12/17/15 1932  WBC 10.1  CREATININE 0.72    Estimated Creatinine Clearance: 85.4 mL/min (by C-G formula based on Cr of 0.72).    Allergies  Allergen Reactions  . Penicillins Shortness Of Breath and Rash    Has patient had a PCN reaction causing immediate rash, facial/tongue/throat swelling, SOB or lightheadedness with hypotension: no Has patient had a PCN reaction causing severe rash involving mucus membranes or skin necrosis: no Has patient had a PCN reaction that required hospitalization no Has patient had a PCN reaction occurring within the last 10 years: no If all of the above answers are "NO", then may proceed with Cephalosporin use.   . Codeine Other (See Comments)    Manic depressive  . Voltaren [Diclofenac] Nausea Only and Other (See Comments)    Pt stated "tore stomach up"  . Erythromycin Rash    Antimicrobials this admission: Rocephin 1 Gm IV x 1 dose Metronizadole 500 mg IV x 1 dose  Dose adjustments this admission:  Microbiology results:  Thank you for allowing pharmacy to be a part of this patient's care.  Norberto Sorenson 12/18/2015 2:26 AM

## 2015-12-18 NOTE — Progress Notes (Signed)
  Patient is eating, ambulating.  Pain control is much better today.  Ativan overnight helped with sleep and anxiety. On IV antibiotics in case 2 small areas adjacent to bladder are infected althought this is low probability.     Filed Vitals:   12/18/15 0055 12/18/15 0544  BP: 102/61 106/62  Pulse: 58 68  Temp: 98 F (36.7 C) 98 F (36.7 C)  TempSrc: Oral Oral  Resp: 18 16  Height: 5\' 6"  (1.676 m)   Weight: 71.668 kg (158 lb)   SpO2: 96% 97%    lungs:   clear to auscultation cor:    RRR Abdomen:  soft, appropriate tenderness, incisions healed without erythema or exudate ex:    no cords Pelvic:        Had been done in office just 3 days ago- no masses, vagina was intact and incisions well healed; no erythema or exudates, mons healed without swelling; bruising on mons was receding quickly; Mons today again without swelling or evidence of infection.  Results for orders placed or performed during the hospital encounter of 12/18/15 (from the past 24 hour(s))  CBC     Status: Abnormal   Collection Time: 12/18/15  6:08 AM  Result Value Ref Range   WBC 8.1 4.0 - 10.5 K/uL   RBC 3.42 (L) 3.87 - 5.11 MIL/uL   Hemoglobin 11.1 (L) 12.0 - 15.0 g/dL   HCT 32.8 (L) 36.0 - 46.0 %   MCV 95.9 78.0 - 100.0 fL   MCH 32.5 26.0 - 34.0 pg   MCHC 33.8 30.0 - 36.0 g/dL   RDW 13.4 11.5 - 15.5 %   Platelets 387 150 - 400 K/uL    A/P    Post operative day 24 from uncomplicated robotic TLH/salpingectomies/TVT/a repair.    Percocet for pain control. Will start some Ativan BID for anxiety and to help with irritation of bladder from catheter.     D/w radiologist about CT- first, he checked for any signs of ureteral injury or obstruction- no signs seen on CT; pt does have some non obstructing stones and some L pyelectasis but these are chronic from hydronephrosis in past from stones.  Second, two small seroma/hematomas seen anterior to the bladder are in the space of retzius where the TVT would have  passed- these are consistent with small amount of bleeding caused by TVT.  Although they can not r/o abscesses in this area, pt's exam previously and currently, and her vitals and bloodwork are not c/w having infection in this area.  Will continue the antibiotics for 24-48 hours just in case.   Pt's main problem has been the irritation and pain from the catheter itself.  Urology consult on phone- pt ok to go back to self cath, reduce fluids and will need consult in office with urodynamics.  Will D/c pt with self cath and Ativan for anxiety.  Will call Dr. Wendy Poet on Monday.

## 2015-12-18 NOTE — Progress Notes (Signed)
Dr. Alwyn Pea notified of patients arrival to unit.

## 2015-12-18 NOTE — H&P (Signed)
Please see full H&P Note by NP below.  Briefly, pt is POD [id number] from uncomplicated robotic TLH/salpingectomies/TVT/A repair/cysto.  Cysto done after TVT needles had been placed indicated an intact bladder, normal ureteral orrifaces, good spill from both orrifaces, intact urethra and no needles in bladder.  The TVT mesh was placed without comp and pt went home on POD 1 with catheter in place.  Five days postop she presented to the office for her routine postop voiding trial.  The patient voided without measuring the first time but when cath was done only two hours after removal of foley, there was in excess of 1000cc removed from bladder.  Because of this distension, we replaced the foley and kept it in for one week.  Three days later I saw her just for f/u and she complained of pressure and pain with catheter.  A transvaginal US was done and revealed no masses or fluid collections in pelvis.  After keeping catheter in for one week, it was removed and pt was able to void 100 cc with PVR 100 cc.  Two successive trials she was unable to void and had only 300-350 cc by cath.  She was sent home to do self cath with voiding and recording PVR.  She had been on antibiotics and uribel or azo standard during this time.  The patient returned one week later to f/u up with voiding diary.  Pt had not been able to void on her own except for 2-3 times.  However the concerning thing was that her catheterized amounts were [phone] cc at a time even when she was cathing every two hours.  I believed that her inability to void was again caused by the overdistention and I recommended that the foley be replaced for two full weeks to decompress bladder.  Pt was upset with this course but agreed.  She was continued on antibiotics and azo standard.  She was to try to begin voiding around cath in one week to retrain bladder.  Her pelvic exam was completely normal.  Yesterday she came to the hospital for evaluation secondary pain and blood  in urine.  Her urine and all her labs were completely normal.  She underwent a CT that revealed two small collections of fluid in the space of retizius but no other abnormalities.  She was admitted for pain control and for antibiotics in case the areas of fluid were infected.  NP note:    Chief Complaint  Patient presents with  . Cystitis   HPI Lindsey Rodriguez is 50 y.o. Lindsey Rodriguez presents for evaluation of lower abdominal pain. She is a patient of [name]. Hx of robotic assisted total hysterectomy, BSO, TVT and ablation of endometriosis on 5/24. She presented 3rd post operative day to MAU with foley catheter problems, "balloon wasn't inflated enough to hold in place". Did well after that visit. Foley was removed 6/6. Tried urine trial without success. Taught self cath. From 6/6-6/14. Had cath in office with residual urine of 900cc. Foley was inserted. Began with pain last night, worsened this am. She saw blood clots in tubing this am. Contacted office and told to come in for CT scan and urology consult. Feels flushed but hasn't taken temperature at home because it "is always sub- normal". Rates pain as a 7/10 at present and at its worse 8.5/10 this am, down to 4.5/10 and now at 7/10. Is taking AZO tid. States no pain or problems with the hysterectomy "part" of her surgery .  Past Medical History  Diagnosis Date  . Anxiety panic attacks    [name]  . Hypertension   . Kidney stones h/o  . Chronic neck pain   . Chronic back pain   . Plantar fasciitis right  . Tendonitis of foot right  . Melanoma ([place]) RUE    [name]  . Depression   . SVD (spontaneous vaginal delivery)     x 3  . Kidney stone     History  . 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 Hyperlipidemia   . Seasonal allergies   . Migraine      OTC med prn - stress related  . Arthritis     knees  . Fibromyalgia     and neuropathy of feet    Past Surgical History  Procedure Laterality Date  . Neck surgery  [name] Summer 2011    X 4 (C4-T1 Level)  . Tonsillectomy    . Rhinoplasty    . Melanoma excision  05/2015    right forearm; [name]  . Back surgery  Dr.Stern, last sx Summer 2011    X 4 (L3-S1)  . Wisdom tooth extraction    . Eye surgery Bilateral     Lasik   . Colonoscopy    . Kidney stone surgery  2011  . Endometrial ablation  08/2000  . Robotic assisted total hysterectomy with salpingectomy Bilateral 11/24/2015    Procedure: ROBOTIC ASSISTED TOTAL HYSTERECTOMY WITH SALPINGECTOMY; Surgeon: [name], MD; Location: [facility]; Service: Gynecology; Laterality: Bilateral;  . Cystoscopy N/A 11/24/2015    Procedure: CYSTOSCOPY; Surgeon: [name], MD; Location: [facility]; Service: Gynecology; Laterality: N/A;  . Robotic assisted bilateral salpingo oopherectomy Bilateral 11/24/2015    Procedure: ROBOTIC ASSISTED BILATERAL SALPINGO OOPHORECTOMY; Surgeon: [name], MD; Location: [facility]; Service: Gynecology; Laterality: Bilateral;  . Bladder suspension N/A 11/24/2015    Procedure: TRANSVAGINAL TAPE (TVT) PROCEDURE; Surgeon: [name], MD; Location: [facility]; Service: Gynecology; Laterality: N/A;  . Ablation on endometriosis N/A 11/24/2015    Procedure: ABLATION ON ENDOMETRIOSIS; Surgeon: [name], MD; Location: [facility]; Service: Gynecology; Laterality: N/A;  . Cystocele repair  11/24/2015    Procedure: ANTERIOR REPAIR (CYSTOCELE); Surgeon: [name], MD; Location: [facility]; Service: Gynecology;;    Family History  Problem Relation Age of Onset  . Hypertension Mother   . Depression Mother   . Migraines Mother   . Hypothyroidism Mother    . COPD Father   . Heart failure Father 63  . Breast cancer Cousin     40's  . Cancer Cousin     breast; in her 52's  . Diabetes Neg Hx     Social History  Substance Use Topics  . Smoking status: Former Smoker -- 0.10 packs/day    Types: Cigarettes    Quit date: 07/04/1987  . Smokeless tobacco: Never Used  . Alcohol Use: 0.0 oz/week    0 Standard drinks or equivalent per week     Comment: ONCE A MONTH Wine    Allergies:  Allergies  Allergen Reactions  . Penicillins Shortness Of Breath and Rash    Has patient had a PCN reaction causing immediate rash, facial/tongue/throat swelling, SOB or lightheadedness with hypotension: no Has patient had a PCN reaction causing severe rash involving mucus membranes or skin necrosis: no Has patient had a PCN reaction that required hospitalization no Has patient had a PCN reaction occurring within  the last 10 years: no If all of the above answers are "NO", then may proceed with Cephalosporin use.   . Codeine Other (See Comments)    Manic depressive  . Voltaren [Diclofenac] Nausea Only and Other (See Comments)    Pt stated "tore stomach up"  . Erythromycin Rash    Prescriptions prior to admission  Medication Sig Dispense Refill Last Dose  . amLODipine-benazepril (LOTREL) 5-10 MG capsule Take 1 capsule by mouth daily. 90 capsule 1 Taking  . B Complex Vitamins (B-COMPLEX/B-12 PO) Take 1 tablet by mouth daily.    Taking  . buPROPion (WELLBUTRIN XL) 150 MG 24 hr tablet Take 150 mg by mouth daily.    Taking  . Calcium Carb-Cholecalciferol (CALCIUM PLUS VITAMIN D3) 600-800 MG-UNIT TABS Take 1 tablet by mouth daily.   Taking  . Cholecalciferol (VITAMIN D) 2000 units tablet Take 2,000 Units by mouth daily.   Taking  . divalproex (DEPAKOTE ER) 500 MG 24 hr tablet Take 1 tablet (500 mg total) by mouth daily. (Patient taking  differently: Take 500 mg by mouth at bedtime. ) 30 tablet 5 Taking  . fenofibrate 160 MG tablet TAKE 1 TABLET (160 MG TOTAL) BY MOUTH DAILY. 90 tablet 0 Taking  . fexofenadine (ALLEGRA) 180 MG tablet Take 180 mg by mouth daily.   Taking  . FLUoxetine (PROZAC) 40 MG capsule Take 40 mg by mouth daily.    Taking  . LORazepam (ATIVAN) 0.5 MG tablet Take 1-2 tablets by mouth 3 (three) times daily as needed. Prn anxiety   Taking  . MAGNESIUM MALATE PO Take 1,250 mg by mouth 2 (two) times daily.   Taking  . Multiple Vitamins-Minerals (MULTIVITAMIN WITH MINERALS) tablet Take 1 tablet by mouth daily.    Taking  . nitrofurantoin, macrocrystal-monohydrate, (MACROBID) 100 MG capsule Take 1 capsule (100 mg total) by mouth 2 (two) times daily. 14 capsule 2 Taking  . Omega-3 Fatty Acids (FISH OIL PO) Take 1 capsule by mouth daily.    Taking  . pregabalin (LYRICA) 150 MG capsule Take 1 capsule (150 mg total) by mouth 2 (two) times daily. 180 capsule 1   . Probiotic Product (TRUBIOTICS PO) Take 1 capsule by mouth daily.   Taking  . Pumpkin Seed-Soy Germ (AZO BLADDER CONTROL/GO-LESS PO) Take 1 capsule by mouth daily.   Taking  . tiZANidine (ZANAFLEX) 2 MG tablet Take 2 mg by mouth every 6 (six) hours as needed for muscle spasms.   Taking    Review of Systems  Constitutional: Negative for fever and chills.  Gastrointestinal: Positive for abdominal pain. Negative for nausea and vomiting (lower abdominal /suprapubic/urinary pain).  Genitourinary:   Has foley catheter-report seeing blood clots in tubing   Physical Exam   Blood pressure 136/64, pulse 66, temperature 97.8 F (36.6 C), temperature source Oral, resp. rate 18, weight 161 lb 3.2 oz (73.12 kg), SpO2 99 %.  Physical Exam  Constitutional: She is oriented to person, place, and time. She appears well-developed and well-nourished. No distress.  HENT:  Head: Normocephalic.   Neck: Normal range of motion.  Cardiovascular: Normal rate.  Genitourinary:  Foley catheter is draining clear yellow urine without blood clots at this time-urine last emptied at 1pm.  Neurological: She is alert and oriented to person, place, and time.  Skin: Skin is warm and dry.  Psychiatric: She has a normal mood and affect. Her behavior is normal. Thought content normal.    Lab Results Last 24 Hours    Results  for orders placed or performed during the hospital encounter of 12/17/15 (from the past 24 hour(s))  Urinalysis, Routine w reflex microscopic (not at [facility]) Status: Abnormal   Collection Time: 12/17/15 4:10 PM  Result Value Ref Range   Color, Urine YELLOW YELLOW   APPearance CLEAR CLEAR   Specific Gravity, Urine <1.005 (L) 1.005 - 1.030   pH 6.0 5.0 - 8.0   Glucose, UA NEGATIVE NEGATIVE mg/dL   Hgb urine dipstick TRACE (A) NEGATIVE   Bilirubin Urine NEGATIVE NEGATIVE   Ketones, ur NEGATIVE NEGATIVE mg/dL   Protein, ur NEGATIVE NEGATIVE mg/dL   Nitrite NEGATIVE NEGATIVE   Leukocytes, UA NEGATIVE NEGATIVE  Urine microscopic-add on Status: Abnormal   Collection Time: 12/17/15 4:10 PM  Result Value Ref Range   Squamous Epithelial / LPF 0-5 (A) NONE SEEN   WBC, UA 0-5 0 - 5 WBC/hpf   RBC / HPF 0-5 0 - 5 RBC/hpf   Bacteria, UA RARE (A) NONE SEEN     MAU Course  Procedures  MDM MSE Consulted with Dr. Alwyn Pea. Order given for CT, IVP to look at ureters. CT machine at [facility] is [facility]. Dr. Alwyn Pea and the patient is aware CT will be done at [facility]. Patient offered [facility] or her husband to drive her. She is not acutely ill. Instructions in the CT order is for the radiologist to call Dr. Alwyn Pea with results before the patient leaves.   Assessment and Plan  A: Post operative pain  Foley catheter in place  Hx of urinary retention  P: discharged with instructions to go to  [facility]  She is aware Dr. Alwyn Pea will be called with results   Belinda Fisher

## 2015-12-18 NOTE — Progress Notes (Signed)
D/C instructions and prescriptions reviewed with pt and family. Pt had no questions at this time. Pt given several 14 french intermittent catheters and lubricant, for self cath at home. Pt had been taught how to self cath with last admission and had no questions about technique at this time. RN reviewed s/s to look for. Pt has f/u appt 6/21 with Dr. Philis Pique. Pt stable, d/c home home with family ambulatory.

## 2015-12-26 NOTE — Discharge Summary (Signed)
Physician Discharge Summary  Patient ID: Lindsey Rodriguez MRN: [id number] DOB/AGE: [birth year] 50 y.o.  Admit date: 12/18/2015 Discharge date: 12/26/2015  Admission Diagnoses:SUI, menorrhagia  Discharge Diagnoses: same   Discharged Condition: good  Hospital Course: Uncomplicated TLH, salpingectomies, TVT, A repair, cysto  Consults: None  Significant Diagnostic Studies: labs:     Treatments: surgery:  Uncomplicated TLH, salpingectomies, TVT, A repair, cysto  Discharge Exam: Blood pressure 106/62, pulse 68, temperature 98 F (36.7 C), temperature source Oral, resp. rate 16, height 5\' 6"  (1.676 m), weight 71.668 kg (158 lb), last menstrual period 12/16/2012, SpO2 97 %.   Disposition: 01-Home or Self Care  Discharge Instructions    Call MD for:  temperature >100.4    Complete by:  As directed      Diet - low sodium heart healthy    Complete by:  As directed      Discharge instructions    Complete by:  As directed   No driving on narcotics, no sexual activity for 2 weeks.     Increase activity slowly    Complete by:  As directed      May shower / Bathe    Complete by:  As directed   Shower, no bath for 2 weeks.     Remove dressing in 24 hours    Complete by:  As directed      Sexual Activity Restrictions    Complete by:  As directed   No sexual activity for 2 weeks.            Medication List    TAKE these medications        amLODipine-benazepril 5-10 MG capsule  Commonly known as:  LOTREL  Take 1 capsule by mouth daily.     AZO STANDARD MAXIMUM STRENGTH PO  Take 1 tablet by mouth 3 (three) times daily.     phenazopyridine 100 MG tablet  Commonly known as:  PYRIDIUM  Take 1 tablet (100 mg total) by mouth 3 (three) times daily.     B-COMPLEX/B-12 PO  Take 1 tablet by mouth daily.     buPROPion 150 MG 24 hr tablet  Commonly known as:  WELLBUTRIN XL  Take 150 mg by mouth daily.     CALCIUM PLUS VITAMIN D3 600-800 MG-UNIT Tabs  Generic drug:  Calcium  Carb-Cholecalciferol  Take 1 tablet by mouth daily.     divalproex 500 MG 24 hr tablet  Commonly known as:  DEPAKOTE ER  Take 1 tablet (500 mg total) by mouth daily.     fenofibrate 160 MG tablet  TAKE 1 TABLET (160 MG TOTAL) BY MOUTH DAILY.     fexofenadine 180 MG tablet  Commonly known as:  ALLEGRA  Take 180 mg by mouth daily.     FISH OIL PO  Take 1 capsule by mouth daily.     FLUoxetine 40 MG capsule  Commonly known as:  PROZAC  Take 40 mg by mouth daily.     LORazepam 0.5 MG tablet  Commonly known as:  ATIVAN  Take 1-2 tablets (0.5-1 mg total) by mouth 3 (three) times daily as needed for anxiety. Prn anxiety     MAGNESIUM MALATE PO  Take 1,250 mg by mouth 2 (two) times daily.     multivitamin with minerals tablet  Take 1 tablet by mouth daily.     nitrofurantoin (macrocrystal-monohydrate) 100 MG capsule  Commonly known as:  MACROBID  Take 1 capsule (100 mg total) by mouth 2 (two)  times daily.     pregabalin 150 MG capsule  Commonly known as:  LYRICA  Take 1 capsule (150 mg total) by mouth 2 (two) times daily.     tiZANidine 2 MG tablet  Commonly known as:  ZANAFLEX  Take 2 mg by mouth every 6 (six) hours as needed for muscle spasms.     TRUBIOTICS PO  Take 1 capsule by mouth daily.     Vitamin D 2000 units tablet  Take 2,000 Units by mouth daily.           Follow-up Information    Follow up with Annastacia Duba,Cosima A, MD In 1 week.   Specialty:  Obstetrics and Gynecology   Contact information:   [address] [place] [address] [phone]       Signed: Fina Heizer,Dniya A 12/26/2015, 7:01 AM

## 2015-12-29 NOTE — Telephone Encounter (Signed)
Left message for pt

## 2015-12-30 NOTE — Telephone Encounter (Signed)
Called pt and she hasn't received the Lyrica new dosage yet.  [facility] t# [phone] said it is being processed and approved and should ship within 7-10 days.  Left message for pt

## 2016-01-16 ENCOUNTER — Other Ambulatory Visit: Payer: Self-pay | Admitting: Family Medicine

## 2016-01-17 ENCOUNTER — Telehealth: Payer: Self-pay

## 2016-01-17 NOTE — Telephone Encounter (Signed)
Pt will come have LFT's done in the next week.

## 2016-01-17 NOTE — Telephone Encounter (Signed)
-----   Message from Amada Kingfisher, [place] sent at 08/04/2015  1:58 PM EST ----- CBC LFT

## 2016-01-17 NOTE — Telephone Encounter (Signed)
Just LFTs

## 2016-01-17 NOTE — Telephone Encounter (Signed)
Had reminder set to have pt get a CBC and LFT drawn. Order's are in system.  Pt was recently hospitalized, had CBC w/ Diff done on June 16th, would you like that repeated? OR just LFTs?

## 2016-01-24 ENCOUNTER — Other Ambulatory Visit: Payer: Self-pay | Admitting: Family Medicine

## 2016-01-27 ENCOUNTER — Other Ambulatory Visit (INDEPENDENT_AMBULATORY_CARE_PROVIDER_SITE_OTHER): Payer: Medicare Other

## 2016-01-27 DIAGNOSIS — G43009 Migraine without aura, not intractable, without status migrainosus: Secondary | ICD-10-CM | POA: Diagnosis not present

## 2016-01-27 LAB — HEPATIC FUNCTION PANEL
ALT: 30 U/L (ref 0–35)
AST: 28 U/L (ref 0–37)
Albumin: 4.3 g/dL (ref 3.5–5.2)
Alkaline Phosphatase: 33 U/L — ABNORMAL LOW (ref 39–117)
Bilirubin, Direct: 0.1 mg/dL (ref 0.0–0.3)
Total Bilirubin: 0.5 mg/dL (ref 0.2–1.2)
Total Protein: 6.7 g/dL (ref 6.0–8.3)

## 2016-02-02 ENCOUNTER — Ambulatory Visit (INDEPENDENT_AMBULATORY_CARE_PROVIDER_SITE_OTHER): Payer: Medicare Other | Admitting: Neurology

## 2016-02-02 ENCOUNTER — Encounter: Payer: Self-pay | Admitting: Family Medicine

## 2016-02-02 ENCOUNTER — Encounter: Payer: Self-pay | Admitting: Neurology

## 2016-02-02 VITALS — BP 100/70 | HR 78 | Ht 66.0 in | Wt 151.0 lb

## 2016-02-02 DIAGNOSIS — R42 Dizziness and giddiness: Secondary | ICD-10-CM | POA: Diagnosis not present

## 2016-02-02 DIAGNOSIS — G43009 Migraine without aura, not intractable, without status migrainosus: Secondary | ICD-10-CM

## 2016-02-02 DIAGNOSIS — M5417 Radiculopathy, lumbosacral region: Secondary | ICD-10-CM | POA: Diagnosis not present

## 2016-02-02 DIAGNOSIS — G44229 Chronic tension-type headache, not intractable: Secondary | ICD-10-CM | POA: Diagnosis not present

## 2016-02-02 NOTE — Patient Instructions (Addendum)
1.  We will continue Depkaote 2.  Use Tylenol and/or tizanidine for tension headaches 3.  Follow up in 6 months.

## 2016-02-02 NOTE — Progress Notes (Signed)
NEUROLOGY FOLLOW UP OFFICE NOTE  MAUNA GLAZEWSKI [id number]  HISTORY OF PRESENT ILLNESS: Lindsey Rodriguez is a 50 year old right-handed woman with chronic neck and back pain with neuropathy, fibromyalgia, hypertension, plantar fasciitis and history of kidney stones and depression who follows up for migraine and lumbosacral radiculopathy.  She is accompanied by her husband who provides some history.  UPDATE: Intensity:  Tension 6-9/10; Migraine 9/10 Duration:  Tension 53min to 3 hours; Migraine until goes to sleep Frequency:  Tension 3 days per week; Migraine once every 2 weeks) Current abortive medication:  Uses ice packs.  Has not tried Debara Pickett powder spray yet because she just goes to sleep when has a migraine. Antihypertensive medications:  amlodipine Antidepressant medications:  wellbutrin, fluoxetine Anticonvulsant medications:  Depakote ER 500mg , Lyrica 75mg  twice daily Vitamins/Herbal/Supplements:  Magnesium maleate 1250mg  twice daily She takes tizanidine sometimes for neck tightness  In January, she developed acute onset of low back pain with pain radiating down the posterior thigh, lateral lower leg and dorsum of foot to big toe on the right.  There is no associated numbness or weakness.  She has taken Flexeril, which is somewhat helpful, but makes her sleepy.  Pain is improved but she still notes pain when she stands up.  There was no preceding injury.  MRI of lumbar spine from 09/26/15 showed postsurgical changes at L4-5 and L5-S1, as well as spinal stenosis at L2-3, but no at the suspected level of L5.  She hand a NCV-EMG performed on 09/28/15, which showed evidence of chronic L5 radiculopathy but nothing acute.  She was referred to Dr. Renie Ora of pain management, where she was prescribed baclofen and Percocet and underwent bilateral L5/S1 transforaminal lumbar epidural steroid injections.  They work but she has had recurrent pain less than a month since last  visit.  She also has been having bladder issues.  She had a hysterectomy and bladder tack.  She has since developed neurogenic bladder.  She also reports dizzy spells.  When she bends down and then gets back up, she feels lightheaded with darkening of vision.  There is some brief spinning component.  She has been on a diet since February and lost 35 lbs.  CBC from 12/18/15 showed WBC 8.1, HGB 11.1, HCT 32.8 and PLT 387. LFTs from 01/27/16 showed TB 0.5, ALP 33, AST 28 and AT 30.  HISTORY: Onset:  Childhood Location:  Varies (back of head, crown, bi-temporal, retro-orbital, band-like) Quality:  Retro-orbital throbbing, band-like vice, stabbing at back of head Initial Intensity:  4/10 constant, 8/10 when severe; February 8-9/10 Aura:  no Prodrome:  no Associated symptoms:  Nausea, photophobia, phonophobia, tunnel vision Initial Duration:  Constant but severe episodes 8 hours (with sumatriptan and naproxen); February 3-4 hours Initial Frequency:  Daily but severe episodes occur once a week; February 3-4 days per month Triggers/exacerbating factors:  stress Relieving factors:  Ice pack Activity:  Cannot function at least once a week.  Past abortive therapy:  Relpax 40mg , Treximet, Maxalt, Zomig po, Tramadol, sumatriptan 100mg  w/wo naproxen, sumatriptan NS, Zomig NS Past preventative therapy:  Cymbalta, Topamax, nortriptyline, sertraline, Effexor, zonisamide, propranolol (hypotension), biofeedback.    To further evaluate numbness and weakness in right arm, he had an MRI of the cervical spine performed on 11/26/14, which showed post surgical changes from C4-T2 fusion.  There was also left disc protrusion at C3-4 where it deforms the left hemi-cord and deflects the cord to the right.  She had a NCV-EMG  performed on 12/08/14, which was normal.  A NCV-EMG was performed on 12/08/14, which was normal.   Her wrist still hurts. She is seeing an orthopedist.  PAST MEDICAL HISTORY: Past Medical History:   Diagnosis Date  . Anxiety panic attacks   [name]  . Arthritis    knees  . Chronic back pain   . Chronic neck pain   . Depression   . Fibromyalgia    and neuropathy of feet  . Hyperlipidemia   . Hypertension   . Kidney stone    History  . Kidney stones h/o  . Melanoma ([place]) RUE   [name]  . MGUS (monoclonal gammopathy of unknown significance) 06/2015   no treatment just watching recheck blood work 12/2015  . Migraine    OTC med prn - stress related  . Plantar fasciitis right  . Seasonal allergies   . SVD (spontaneous vaginal delivery)    x 3  . Tendonitis of foot right    MEDICATIONS: Current Outpatient Prescriptions on File Prior to Visit  Medication Sig Dispense Refill  . amLODipine-benazepril (LOTREL) 5-10 MG capsule TAKE 1 CAPSULE BY MOUTH DAILY. 90 capsule 0  . B Complex Vitamins (B-COMPLEX/B-12 PO) Take 1 tablet by mouth daily.     Marland Kitchen buPROPion (WELLBUTRIN XL) 150 MG 24 hr tablet Take 150 mg by mouth daily.     . divalproex (DEPAKOTE ER) 500 MG 24 hr tablet Take 1 tablet (500 mg total) by mouth daily. (Patient taking differently: Take 500 mg by mouth at bedtime. ) 30 tablet 5  . fenofibrate 160 MG tablet TAKE 1 TABLET (160 MG TOTAL) BY MOUTH DAILY. 90 tablet 0  . fexofenadine (ALLEGRA) 180 MG tablet Take 180 mg by mouth daily.    Marland Kitchen FLUoxetine (PROZAC) 40 MG capsule Take 40 mg by mouth daily.     Marland Kitchen LORazepam (ATIVAN) 0.5 MG tablet Take 1-2 tablets (0.5-1 mg total) by mouth 3 (three) times daily as needed for anxiety. Prn anxiety 30 tablet 0  . MAGNESIUM MALATE PO Take 1,250 mg by mouth 2 (two) times daily.    . Multiple Vitamins-Minerals (MULTIVITAMIN WITH MINERALS) tablet Take 1 tablet by mouth daily.      . Omega-3 Fatty Acids (FISH OIL PO) Take 1 capsule by mouth daily.     . Phenazopyridine HCl (AZO STANDARD MAXIMUM STRENGTH PO) Take 1 tablet by mouth 3 (three) times daily.    . pregabalin (LYRICA) 150 MG capsule Take 1 capsule (150 mg total) by mouth 2 (two)  times daily. 180 capsule 1  . Probiotic Product (TRUBIOTICS PO) Take 1 capsule by mouth daily.    Marland Kitchen tiZANidine (ZANAFLEX) 2 MG tablet Take 2 mg by mouth every 6 (six) hours as needed for muscle spasms.     No current facility-administered medications on file prior to visit.     ALLERGIES: Allergies  Allergen Reactions  . Penicillins Shortness Of Breath and Rash    Has patient had a PCN reaction causing immediate rash, facial/tongue/throat swelling, SOB or lightheadedness with hypotension: no Has patient had a PCN reaction causing severe rash involving mucus membranes or skin necrosis: no Has patient had a PCN reaction that required hospitalization no Has patient had a PCN reaction occurring within the last 10 years: no If all of the above answers are "NO", then may proceed with Cephalosporin use.   . Codeine Other (See Comments)    Manic depressive  . Voltaren [Diclofenac] Nausea Only and Other (See Comments)  Pt stated "tore stomach up"  . Erythromycin Rash    FAMILY HISTORY: Family History  Problem Relation Age of Onset  . Hypertension Mother   . Depression Mother   . Migraines Mother   . Hypothyroidism Mother   . COPD Father   . Heart failure Father 52  . Breast cancer Cousin     40's  . Cancer Cousin     breast; in her 37's  . Diabetes Neg Hx     SOCIAL HISTORY: Social History   Social History  . Marital status: Married    Spouse name: N/A  . Number of children: N/A  . Years of education: N/A   Occupational History  . Not on file.   Social History Main Topics  . Smoking status: Former Smoker    Packs/day: 0.10    Types: Cigarettes    Quit date: 07/04/1987  . Smokeless tobacco: Never Used  . Alcohol use 0.0 oz/week     Comment: ONCE A MONTH Wine  . Drug use: No  . Sexual activity: Yes    Partners: Male    Birth control/ protection: Other-see comments     Comment: husband had vasectomy   Other Topics Concern  . Not on file   Social History  Narrative   Married.  Lives with husband, 1 daughter, brother-in-law (with Down's syndrome), 2 dogs.   2 other children live locally. Disabled due to anxiety.  Previously worked as Public relations account executive.    REVIEW OF SYSTEMS: Constitutional: No fevers, chills, or sweats, no generalized fatigue, change in appetite Eyes: No visual changes, double vision, eye pain Ear, nose and throat: No hearing loss, ear pain, nasal congestion, sore throat Cardiovascular: No chest pain, palpitations Respiratory:  No shortness of breath at rest or with exertion, wheezes GastrointestinaI: No nausea, vomiting, diarrhea, abdominal pain, fecal incontinence Genitourinary:  urinary retention or frequency Musculoskeletal:  Neck pain, back pain Integumentary: No rash, pruritus, skin lesions Neurological: as above Psychiatric: No depression, insomnia, anxiety Endocrine: No palpitations, fatigue, diaphoresis, mood swings, change in appetite, change in weight, increased thirst Hematologic/Lymphatic:  No purpura, petechiae. Allergic/Immunologic: no itchy/runny eyes, nasal congestion, recent allergic reactions, rashes  PHYSICAL EXAM: Vitals:   02/02/16 1112  BP: 100/70  Pulse: 78   General: No acute distress.  Patient appears well-groomed.  normal body habitus. Head:  Normocephalic/atraumatic Eyes:  Fundi examined but not visualized Neck: supple, bilateral paraspinal tenderness, limited range of motion Heart:  Regular rate and rhythm Lungs:  Clear to auscultation bilaterally Back: No paraspinal tenderness Neurological Exam: alert and oriented to person, place, and time. Attention span and concentration intact, recent and remote memory intact, fund of knowledge intact.  Speech fluent and not dysarthric, language intact.  CN II-XII intact. Bulk and tone normal, muscle strength 5/5 throughout.  Sensation to light touch, temperature and vibration intact.  Deep tendon reflexes 2+ throughout.  Finger to nose and heel to  shin testing intact.  Gait cautious  IMPRESSION: 1.  migraine without aura 2.  Tension type headaches 3.  Lumbosacral radiculopathy on right, L5 4.  Dizziness.  There is a component that sounds like BPPV.  However given the lightheadedness (sensation of going to pass out) along with darkening of vision, near-syncope is expected.  Her BP is 100/70.  Perhaps her antihypertensive medication needs to be discontinued since losing weight.  She has mild anemia, so this may be a contributor but not too sure.  PLAN: 1.  Continue current management with  Depakote for migraine preventative and Tylenol or tizanidine for acute treatment. 2.  Follow up with Dr. Andree Elk for lumbosacral radicular pain 3.  Discuss with Dr. Tomi Bamberger regarding dizziness 4.  Follow up  28 minutes spent face to face with patient, over 50% spent counseling.  Metta Clines, DO  CC:  Victoriano Lain, MD

## 2016-02-03 NOTE — Telephone Encounter (Signed)
Reordered Lyrica, to ship 03/16/16 and they need new Rx for next refill

## 2016-02-09 ENCOUNTER — Ambulatory Visit (INDEPENDENT_AMBULATORY_CARE_PROVIDER_SITE_OTHER): Payer: Medicare Other | Admitting: Family Medicine

## 2016-02-09 ENCOUNTER — Encounter: Payer: Self-pay | Admitting: Family Medicine

## 2016-02-09 VITALS — BP 110/72 | HR 60 | Temp 98.1°F | Wt 153.2 lb

## 2016-02-09 DIAGNOSIS — H8110 Benign paroxysmal vertigo, unspecified ear: Secondary | ICD-10-CM | POA: Diagnosis not present

## 2016-02-09 MED ORDER — MECLIZINE HCL 25 MG PO TABS: 12.5000 mg | ORAL_TABLET | Freq: Two times a day (BID) | ORAL | 0 refills | Status: DC | PRN

## 2016-02-09 NOTE — Progress Notes (Signed)
Subjective:    Patient ID: Lindsey Rodriguez, female    DOB: Mar 30, 1966, 50 y.o.   MRN: [id number]  HPI Chief Complaint  Patient presents with  . Dizziness    dizziness, vagina- is infalmmated from pad.     She is a 50 year old female with a history of HTN,  Hyperlipidemia, anxiety, migraines, anemia and chronic neck and back pain who normally sees Dr. Tomi Bamberger for primary care and is here with complaints of dizziness, lightheadedness when she changes positions or turns quickly. States dizziness started in April and is becoming more frequent. Describes dizziness as room spinning and feeling lightheaded. Visual symptoms are described as "tunneling and darkening" but this occus "rarely" and last occurred yesterday afternoon.  States symptoms last for seconds, less than a minute.  She has been dieting and exercising to lose weight and has lost 40 lbs. She has a goal to continue with weight loss. She is eating 3 meals per day with 2 snacks. Is currently doing "the next 50 days program". States she is well hydrated.     Associated symptoms of nausea lasting 5-10 minutes after eating then subsides.    Denies fever, chills, nausea, vomiting, diarrhea. No numbness, tingling or weakness. No memory issues or confusion.   She thought her symptoms may be related to her blood pressure medication so she stopped taking it for a week and did not notice any difference with symptoms. She has stopped taking HTN medication completely and does not want to restart it.   Dr. Tomi Likens doubled her Lyrica dose in April to help her with lumbar spine pain and she is curious if this could be causing this. She cut her dose back to 75 mg 2 days ago on her on, did not alert Dr. Tomi Likens.   Has been seeing her gynecologist, Dr. Philis Pique just added medication Myrbetriq for overactive bladder 4 days ago.  Dr. Madelon Lips had her on antibiotics.  Dr. Jeanann Lewandowsky is her hematology/onconogist. Diagnosed with MGUS in December. Goes back in 6  months.   She was seen by her neurologist, Dr. Tomi Likens for these complaints as well and he was thinking they could be related to BPPV vs near syncope.  He also checked labs that showed mild anemia.      Past Medical History:  Diagnosis Date  . Anxiety panic attacks   [name]  . Arthritis    knees  . Chronic back pain   . Chronic neck pain   . Depression   . Fibromyalgia    and neuropathy of feet  . Hyperlipidemia   . Hypertension   . Kidney stone    History  . Kidney stones h/o  . Melanoma ([place]) RUE   [name]  . MGUS (monoclonal gammopathy of unknown significance) 06/2015   no treatment just watching recheck blood work 12/2015  . Migraine    OTC med prn - stress related  . Plantar fasciitis right  . Seasonal allergies   . SVD (spontaneous vaginal delivery)    x 3  . Tendonitis of foot right     Review of Systems Pertinent positives and negatives in the history of present illness.     Objective:   Physical Exam  Constitutional: She is oriented to person, place, and time. She appears well-developed and well-nourished. No distress.  HENT:  Right Ear: Tympanic membrane and ear canal normal.  Left Ear: Tympanic membrane and ear canal normal.  Nose: Right sinus exhibits maxillary sinus tenderness  and frontal sinus tenderness. Left sinus exhibits maxillary sinus tenderness and frontal sinus tenderness.  Mouth/Throat: Uvula is midline, oropharynx is clear and moist and mucous membranes are normal.  Sinuses with diffuse tenderness to light palpation  Eyes: Conjunctivae and EOM are normal. Pupils are equal, round, and reactive to light.  Neck: Normal range of motion. Neck supple. No JVD present. Carotid bruit is not present.  Cardiovascular: Normal rate, regular rhythm, normal heart sounds and intact distal pulses.  Exam reveals no friction rub.   No murmur heard. Pulmonary/Chest: Effort normal and breath sounds normal.  Lymphadenopathy:    She has no cervical adenopathy.   Neurological: She is alert and oriented to person, place, and time. She has normal strength and normal reflexes. She displays no tremor. No cranial nerve deficit or sensory deficit. She displays a negative Romberg sign. Coordination and gait normal.  Skin: Skin is warm and dry. No rash noted. No pallor.  Psychiatric: She has a normal mood and affect. Her speech is normal and behavior is normal. Judgment and thought content normal. Cognition and memory are normal.   BP 110/72 (BP Location: Right Arm, Patient Position: Sitting, Cuff Size: Normal)   Pulse 60   Temp 98.1 F (36.7 C) (Oral)   Wt 153 lb 3.2 oz (69.5 kg)   LMP 12/16/2012   BMI 24.73 kg/m       Assessment & Plan:  Benign paroxysmal positional vertigo, unspecified laterality - Plan: meclizine (ANTIVERT) 25 MG tablet she is not orthostatic. Neuro exam is normal. Do not suspect she is having an acute event.  She is reducing the dose of her Lyrica and will not take Lotrel at this point. Her blood pressure is fine without it on board for the past week. Ok to stop HTN medication at this point.  Discussed checking labs and patient prefers to have all of her labs checked when she returns for her CPE with Dr. Tomi Bamberger. I am ok with this. She has been evaluated by neurologist for dizziness. Discussed that she appears to have dizziness related to position changes as evidenced by the complaint of spinning when laying down on the exam table and then when sitting back upright. It resolved fairly quickly.  Discussed that she can try meclizine 12.5mg  to see if this helps with symptoms and made her aware that this medication can be sedating for some people and can dry her up so she should make sure she is drinking water.   She will follow up with Dr Tomi Bamberger for CPE or sooner if needed.  Her husband is with her and aware of plan of care.

## 2016-02-09 NOTE — Patient Instructions (Signed)
Try the meclizine 12.5 mg and see if this helps. Keep a close eye on your blood pressure and bring these readings to your next appointment. Call and get in to see Dr. Tomi Bamberger if your symptoms worsen.   Benign Positional Vertigo Vertigo is the feeling that you or your surroundings are moving when they are not. Benign positional vertigo is the most common form of vertigo. The cause of this condition is not serious (is benign). This condition is triggered by certain movements and positions (is positional). This condition can be dangerous if it occurs while you are doing something that could endanger you or others, such as driving.  CAUSES In many cases, the cause of this condition is not known. It may be caused by a disturbance in an area of the inner ear that helps your brain to sense movement and balance. This disturbance can be caused by a viral infection (labyrinthitis), head injury, or repetitive motion. RISK FACTORS This condition is more likely to develop in:  Women.  People who are 51 years of age or older. SYMPTOMS Symptoms of this condition usually happen when you move your head or your eyes in different directions. Symptoms may start suddenly, and they usually last for less than a minute. Symptoms may include:  Loss of balance and falling.  Feeling like you are spinning or moving.  Feeling like your surroundings are spinning or moving.  Nausea and vomiting.  Blurred vision.  Dizziness.  Involuntary eye movement (nystagmus). Symptoms can be mild and cause only slight annoyance, or they can be severe and interfere with daily life. Episodes of benign positional vertigo may return (recur) over time, and they may be triggered by certain movements. Symptoms may improve over time. DIAGNOSIS This condition is usually diagnosed by medical history and a physical exam of the head, neck, and ears. You may be referred to a health care provider who specializes in ear, nose, and throat (ENT)  problems (otolaryngologist) or a provider who specializes in disorders of the nervous system (neurologist). You may have additional testing, including:  MRI.  A CT scan.  Eye movement tests. Your health care provider may ask you to change positions quickly while he or she watches you for symptoms of benign positional vertigo, such as nystagmus. Eye movement may be tested with an electronystagmogram (ENG), caloric stimulation, the Dix-Hallpike test, or the roll test.  An electroencephalogram (EEG). This records electrical activity in your brain.  Hearing tests. TREATMENT Usually, your health care provider will treat this by moving your head in specific positions to adjust your inner ear back to normal. Surgery may be needed in severe cases, but this is rare. In some cases, benign positional vertigo may resolve on its own in 2-4 weeks. HOME CARE INSTRUCTIONS Safety  Move slowly.Avoid sudden body or head movements.  Avoid driving.  Avoid operating heavy machinery.  Avoid doing any tasks that would be dangerous to you or others if a vertigo episode would occur.  If you have trouble walking or keeping your balance, try using a cane for stability. If you feel dizzy or unstable, sit down right away.  Return to your normal activities as told by your health care provider. Ask your health care provider what activities are safe for you. General Instructions  Take over-the-counter and prescription medicines only as told by your health care provider.  Avoid certain positions or movements as told by your health care provider.  Drink enough fluid to keep your urine clear or pale  yellow.  Keep all follow-up visits as told by your health care provider. This is important. SEEK MEDICAL CARE IF:  You have a fever.  Your condition gets worse or you develop new symptoms.  Your family or friends notice any behavioral changes.  Your nausea or vomiting gets worse.  You have numbness or a "pins  and needles" sensation. SEEK IMMEDIATE MEDICAL CARE IF:  You have difficulty speaking or moving.  You are always dizzy.  You faint.  You develop severe headaches.  You have weakness in your legs or arms.  You have changes in your hearing or vision.  You develop a stiff neck.  You develop sensitivity to light.   This information is not intended to replace advice given to you by your health care provider. Make sure you discuss any questions you have with your health care provider.   Document Released: 03/27/2006 Document Revised: 03/10/2015 Document Reviewed: 10/12/2014 Elsevier Interactive Patient Education [employer].

## 2016-02-16 ENCOUNTER — Other Ambulatory Visit: Payer: Self-pay | Admitting: Neurology

## 2016-02-22 ENCOUNTER — Telehealth: Payer: Self-pay | Admitting: Family Medicine

## 2016-02-22 DIAGNOSIS — Z131 Encounter for screening for diabetes mellitus: Secondary | ICD-10-CM

## 2016-02-22 DIAGNOSIS — R5382 Chronic fatigue, unspecified: Secondary | ICD-10-CM

## 2016-02-22 DIAGNOSIS — E559 Vitamin D deficiency, unspecified: Secondary | ICD-10-CM

## 2016-02-22 DIAGNOSIS — I10 Essential (primary) hypertension: Secondary | ICD-10-CM

## 2016-02-22 DIAGNOSIS — E781 Pure hyperglyceridemia: Secondary | ICD-10-CM

## 2016-02-22 NOTE — Telephone Encounter (Signed)
Pt called and stated that she has an upcoming fasting appt. She would like to come in this week and have the lab portion done. Please advise. Pt can be reached at [phone]

## 2016-02-22 NOTE — Telephone Encounter (Signed)
Orders entered

## 2016-02-23 ENCOUNTER — Other Ambulatory Visit: Payer: Medicare Other

## 2016-02-23 DIAGNOSIS — E781 Pure hyperglyceridemia: Secondary | ICD-10-CM

## 2016-02-23 DIAGNOSIS — Z131 Encounter for screening for diabetes mellitus: Secondary | ICD-10-CM

## 2016-02-23 DIAGNOSIS — I10 Essential (primary) hypertension: Secondary | ICD-10-CM

## 2016-02-23 DIAGNOSIS — E559 Vitamin D deficiency, unspecified: Secondary | ICD-10-CM

## 2016-02-23 DIAGNOSIS — R5382 Chronic fatigue, unspecified: Secondary | ICD-10-CM

## 2016-02-23 DIAGNOSIS — M5417 Radiculopathy, lumbosacral region: Secondary | ICD-10-CM

## 2016-02-23 LAB — LIPID PANEL
Cholesterol: 175 mg/dL (ref 125–200)
HDL: 77 mg/dL (ref >=46)
LDL Cholesterol: 87 mg/dL (ref <130)
Total CHOL/HDL Ratio: 2.3 Ratio (ref <=5.0)
Triglycerides: 57 mg/dL (ref <150)
VLDL: 11 mg/dL (ref <30)

## 2016-02-23 LAB — CBC WITH DIFFERENTIAL/PLATELET
Basophils Absolute: 63 cells/uL (ref 0–200)
Basophils Relative: 1 %
Eosinophils Absolute: 63 cells/uL (ref 15–500)
Eosinophils Relative: 1 %
HCT: 35.3 % (ref 35.0–45.0)
Hemoglobin: 11.5 g/dL — ABNORMAL LOW (ref 11.7–15.5)
Lymphocytes Relative: 32 %
Lymphs Abs: 2016 cells/uL (ref 850–3900)
MCH: 33.6 pg — ABNORMAL HIGH (ref 27.0–33.0)
MCHC: 32.6 g/dL (ref 32.0–36.0)
MCV: 103.2 fL — ABNORMAL HIGH (ref 80.0–100.0)
MPV: 9.9 fL (ref 7.5–12.5)
Monocytes Absolute: 567 cells/uL (ref 200–950)
Monocytes Relative: 9 %
Neutro Abs: 3591 cells/uL (ref 1500–7800)
Neutrophils Relative %: 57 %
Platelets: 341 10*3/uL (ref 140–400)
RBC: 3.42 MIL/uL — ABNORMAL LOW (ref 3.80–5.10)
RDW: 13.7 % (ref 11.0–15.0)
WBC: 6.3 10*3/uL (ref 4.0–10.5)

## 2016-02-23 LAB — TSH: TSH: 1.69 mIU/L

## 2016-02-23 LAB — GLUCOSE, RANDOM: Glucose, Bld: 66 mg/dL (ref 65–99)

## 2016-02-24 LAB — VITAMIN D 25 HYDROXY (VIT D DEFICIENCY, FRACTURES): Vit D, 25-Hydroxy: 35 ng/mL (ref 30–100)

## 2016-03-01 NOTE — Progress Notes (Signed)
Chief Complaint  Patient presents with  . Annual Exam    fasting annual exam, no gyn exam-sees Dr.Horvath and is UTD. Did not do eye exam, sees [facility] (Dr Charma Igo) and has appt coming up. Would like to discuss to possibly coming off bp meds and chol meds. UA showed 1+ leuks, 1+ uro, 1+ bili - no symptoms. Would also like to discuss NuVigil and getting on this med. And meclizine and her dizziness.     Lindsey Rodriguez is a 50 y.o. female who presents for a complete physical, and follow-up on her many chronic problems.  She is followed by many specialists.  She was seen earlier this month by [facility] with complaints of dizziness. She was diagnosed with benign positional vertigo and was given meclizine.  Vertigo has resolved.  At that last visit, she reported that she had lost a lot of weight, thought BP's could have been low.  She had stopped her lotrel, and BP was good at her visit, so Vickie kept her off of her BP meds.  BP's have been running a little high (130's-140's) over the past few days related to taking decongestants. When checked at [name] office it was great.  Denies headaches when BP high, no dizziness.  Dr. Tomi Likens doubled her Lyrica dose in April to help her with lumbar spine pain.  She told Vickie that she cut her dose back to 75 mg as she wasn't sure if it was contributing to symptoms. She has stayed at the 75mg dose  and hasn't had any worsening of pain, doing well at this dose.  She sees Dr. Tomi Likens also for her headaches, and these overall are improved. Ice pack, and samples of some sort of nose spray from Dr. Tomi Likens was effective when she had a headache.  Can't recall the name of the medication.  She had a hysterectomy and bladder tack recently (Dr. Philis Pique), and developed a neurogenic bladder.  She had been doing intermittent catheterizations, and was also seen by urologist in [facility]. She no longer needs to self-cath, but has now developed overactive bladder.   She was started on Myrbetriq by Dr. Philis Pique, and dose was increased from 25 to 50mg  about 2 weeks ago (increased by the urologist).  Still has some urgency and urge incontinence.  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  She was referred to Dr. Estanislado Pandy last year for chronic fatigue syndrome and pain. She last saw her in June--was having shoulder pain at that time, but declined injection.  She was given exercises, and plans to see her in 6 months. Did not mention fatigue issues at that visit. She continues to complain of fatigue.  She is asking about Nuvigil. Her husband tried it without benefit, but knows of others who have done very well on it.  She continues to see [name] for depression and anxiety and reports that her moods are good.  Hypertriglyceridemia:  On fenofibrate and fish oil (had to decrease to mini-capsule in order to swallow), following lowfat diet. She had labs done prior to her visit (see below).  Hasn't seen podiatrist in over 6 months--got some good shoes and denies any foot pain.  She previously was being seen for right foot tendinitis, plantar fasciitis, which has resolved.  She also has h/o neuropathy in feet. The lyrica, good shoes (Naot) and feet elevation make this manageable.    She also has had right arm pain and weakness.  Last year she had NCV and  EMG. 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 There is mild deflection of the cord to the right. Status post C4-T2 fusion. The central canal and foramina appear open at each level.  She continues to have some right shoulder and arm pain, uses ice and advil.  Lyrica has been effective in treating the neuropathy.  Saw pain clinic in May, and had injections for low back pain. (Preferred pain clinic)  Had one in May, June, July and scheduled for another in a couple of weeks.  They seem to be helping.  The one in July "was fantastic" but then had to quickly empty her in-law's house (moving, cleaning, lifting), over-did it and that's why she is planning another injection.  Overall she is much better.  Intentional weight loss--following "the next 56 days", low carb, "good fats", lean protein, lots of vegetables and water.  Cut out pork, soy, wheat, corn, potatoes.  She reports she has seen Dr. Nadyne Coombes in past --no records available in system.  Looks like she had pre-op EKG 2 mos ago before her hysterectomy. She denies any chest pain, palpitations, tachycardia or shortness of breath.    Immunization History  Administered Date(s) Administered  . Influenza Split 03/18/2011, 04/02/2014, 05/01/2014  . Influenza Whole 07/03/2009  . Influenza, Seasonal, Injecte, Preservative Fre 06/04/2012  . Influenza,inj,Quad PF,36+ Mos 03/10/2013, 04/07/2015  . Tdap 09/05/2012   Last Pap smear: per Dr. Philis Pique, approx 10/2014; s/p hysterectomy recently Last mammogram: 05/2015 Last colonoscopy: 12/2012, had polyp (due again 5 years, per pt) Last DEXA: never Dentist: twice yearly Ophtho: yearly Exercise:  "working on it--hit and miss".  Tries to walk, stationary bicycle 15 minutes 2x/week, some free weights (2x/week).  Past Medical History:  Diagnosis Date  . Anxiety panic attacks   [name]  . Arthritis    knees  . Chronic back pain   . Chronic neck pain   . Depression   . Fibromyalgia    and neuropathy of feet  . Hyperlipidemia   . Hypertension   . Kidney stone    History  . Kidney stones h/o  . Melanoma ([place]) RUE   [name]  . MGUS (monoclonal gammopathy of unknown significance) 06/2015   no treatment just watching recheck blood work 12/2015  . Migraine    OTC med prn - stress related  . Plantar fasciitis right  . Seasonal allergies   . SVD (spontaneous vaginal delivery)    x 3  . Tendonitis of foot right    Past Surgical History:  Procedure Laterality Date  . ABLATION ON  ENDOMETRIOSIS N/A 11/24/2015   Procedure: ABLATION ON ENDOMETRIOSIS;  Surgeon: [name], MD;  Location: [facility];  Service: Gynecology;  Laterality: N/A;  . BACK SURGERY  Dr.Stern, last sx Summer 2011   X 4  (L3-S1)  . BLADDER SUSPENSION N/A 11/24/2015   Procedure: TRANSVAGINAL TAPE (TVT) PROCEDURE;  Surgeon: [name], MD;  Location: [facility];  Service: Gynecology;  Laterality: N/A;  . COLONOSCOPY    . CYSTOCELE REPAIR  11/24/2015   Procedure: ANTERIOR REPAIR (CYSTOCELE);  Surgeon: [name], MD;  Location: [facility];  Service: Gynecology;;  . Consuela Mimes N/A 11/24/2015   Procedure: CYSTOSCOPY;  Surgeon: [name], MD;  Location: [facility];  Service: Gynecology;  Laterality: N/A;  . ENDOMETRIAL ABLATION  08/2000  . EYE SURGERY Bilateral    Lasik   . KIDNEY  STONE SURGERY  2011  . MELANOMA EXCISION  05/2015   right forearm; [name]  . NECK SURGERY  [name]  Summer 2011   X 4  (C4-T1 Level)  . RHINOPLASTY    . ROBOTIC ASSISTED BILATERAL SALPINGO OOPHERECTOMY Bilateral 11/24/2015   Procedure: ROBOTIC ASSISTED BILATERAL SALPINGO OOPHORECTOMY;  Surgeon: [name], MD;  Location: [facility];  Service: Gynecology;  Laterality: Bilateral;  . ROBOTIC ASSISTED TOTAL HYSTERECTOMY WITH SALPINGECTOMY Bilateral 11/24/2015   Procedure: ROBOTIC ASSISTED TOTAL HYSTERECTOMY WITH SALPINGECTOMY;  Surgeon: [name], MD;  Location: [facility];  Service: Gynecology;  Laterality: Bilateral;  . TONSILLECTOMY    . WISDOM TOOTH EXTRACTION     SHE reports she did NOT have oopherectomy, just salpingectomy (both listed in epic, as above).  Social History   Social History  . Marital status: Married    Spouse name: N/A  . Number of children: N/A  . Years of education: N/A   Occupational History  . Not on file.   Social History Main Topics  . Smoking status: Former Smoker    Packs/day: 0.10    Types: Cigarettes    Quit date: 07/04/1987  . Smokeless tobacco: Never Used  . Alcohol use  0.0 oz/week     Comment: ONCE A MONTH Wine  . Drug use: No  . Sexual activity: Yes    Partners: Male    Birth control/ protection: Other-see comments     Comment: husband had vasectomy   Other Topics Concern  . Not on file   Social History Narrative   Married.  Lives with husband, 1 daughter (in college), son, brother-in-law (with Down's syndrome)--moving into Memory care 02/2016.    1 dog.   1 daughter in [place], [place].   Granddaughter in [place] (with her mother)   Disabled due to anxiety.  Previously worked as Public relations account executive.    Family History  Problem Relation Age of Onset  . Hypertension Mother   . Depression Mother   . Migraines Mother   . Hypothyroidism Mother   . COPD Father   . Heart failure Father 54  . Breast cancer Cousin     40's  . Cancer Cousin     breast; in her 72's  . Diabetes Neg Hx     Outpatient Encounter Prescriptions as of 03/02/2016  Medication Sig Note  . amLODipine-benazepril (LOTREL) 5-10 MG capsule TAKE 1 CAPSULE BY MOUTH DAILY. 03/02/2016: Hasn't taken in a month  . B Complex Vitamins (B-COMPLEX/B-12 PO) Take 1 tablet by mouth daily.    . Biotin 5000 MCG CAPS Take 5,000 mcg by mouth daily.   Marland Kitchen buPROPion (WELLBUTRIN XL) 150 MG 24 hr tablet Take 150 mg by mouth daily.    . divalproex (DEPAKOTE ER) 500 MG 24 hr tablet TAKE 1 TABLET (500 MG TOTAL) BY MOUTH DAILY.   . fenofibrate 160 MG tablet TAKE 1 TABLET (160 MG TOTAL) BY MOUTH DAILY.   . fexofenadine (ALLEGRA) 180 MG tablet Take 180 mg by mouth daily.   Marland Kitchen FLUoxetine (PROZAC) 40 MG capsule Take 40 mg by mouth daily.    Marland Kitchen MAGNESIUM MALATE PO Take 1,250 mg by mouth 2 (two) times daily.   . Multiple Vitamins-Minerals (MULTIVITAMIN WITH MINERALS) tablet Take 1 tablet by mouth daily.     . Omega-3 Fatty Acids (FISH OIL PO) Take 2 capsules by mouth daily.  03/02/2016: 180 EPA/120 DHA  . Phenazopyridine HCl (AZO STANDARD MAXIMUM STRENGTH PO) Take 2 tablets by mouth 2 (two)  times daily.    .  pregabalin (LYRICA) 150 MG capsule Take 1 capsule (150 mg total) by mouth 2 (two) times daily. (Patient taking differently: Take 75 mg by mouth 2 (two) times daily. )   . tiZANidine (ZANAFLEX) 2 MG tablet Take 2 mg by mouth every 6 (six) hours as needed for muscle spasms. 11/10/2015: Uses once or twice a week (neck and back pain)  . LORazepam (ATIVAN) 0.5 MG tablet Take 1-2 tablets (0.5-1 mg total) by mouth 3 (three) times daily as needed for anxiety. Prn anxiety   . meclizine (ANTIVERT) 25 MG tablet Take 0.5 tablets (12.5 mg total) by mouth 2 (two) times daily as needed for dizziness. (Patient not taking: Reported on 03/02/2016)    No facility-administered encounter medications on file as of 03/02/2016.     Allergies  Allergen Reactions  . Penicillins Shortness Of Breath and Rash    Has patient had a PCN reaction causing immediate rash, facial/tongue/throat swelling, SOB or lightheadedness with hypotension: no Has patient had a PCN reaction causing severe rash involving mucus membranes or skin necrosis: no Has patient had a PCN reaction that required hospitalization no Has patient had a PCN reaction occurring within the last 10 years: no If all of the above answers are "NO", then may proceed with Cephalosporin use.   . Codeine Other (See Comments)    Manic depressive  . Voltaren [Diclofenac] Nausea Only and Other (See Comments)    Pt stated "tore stomach up"  . Erythromycin Rash    ROS:  The patient denies anorexia, fever, vision changes, decreased hearing, ear pain, sore throat, breast concerns, chest pain, palpitations, dizziness (vertigo resolved), syncope, dyspnea on exertion, cough, swelling, nausea, vomiting, diarrhea, constipation, abdominal pain, melena, hematochezia, indigestion/heartburn, hematuria, dysuria, vaginal bleeding, discharge, odor or itch, genital lesions, numbness, tingling (has in feet, but controlled/improved), weakness, tremor, suspicious skin lesions, depression,  anxiety, abnormal bleeding/bruising, or enlarged lymph nodes. +urinary urgency and urge incontinence Recent sinus symptoms/flare--resolved now (BP's a little higher from OTC decongestants when taking them). Moods are well controlled. Neuropathy in the feet is controlled with Lyrica. She has RUE pain, but that has improved some with lyrica.  She has neck pain (mainly right sided), for which she takes the tizanidine once or twice weekly with good results. Headaches are 2-3/month (much improved), uses ice packs, sleep; and sample of nasal spray prn (from Dr. Tomi Likens).  40# weight loss since January 2017.    PHYSICAL EXAM:  BP 100/60 (BP Location: Right Arm, Patient Position: Sitting, Cuff Size: Normal)   Temp 98.7 F (37.1 C) (Tympanic)   Ht 5\' 6"  (1.676 m)   Wt 154 lb 6.4 oz (70 kg)   LMP 12/16/2012   BMI 24.92 kg/m   General Appearance:    Alert, cooperative, no distress, appears stated age. Accompanied by her husband  Head:    Normocephalic, without obvious abnormality, atraumatic, nontender  Eyes:    PERRL, conjunctiva/corneas clear, EOM's intact, fundi benign  Ears:    Normal TM's and external ear canals  Nose:   Nares normal, mucosa is mildly edematous, without erythema or purulence. Sinuses nontender.  Throat:   Lips, mucosa, and tongue normal; teeth and gums normal  Neck:   Supple, no lymphadenopathy;  thyroid:  no   enlargement/tenderness/nodules; no carotid   bruit or JVD  Back:    Spine nontender, no curvature, ROM normal, no CVA     tenderness  Lungs:     Clear to auscultation  bilaterally without wheezes, rales or ronchi; respirations unlabored  Chest Wall:    No tenderness or deformity   Heart:    Frequent ectopy, in bigeminy pattern; no murmur, rub   or gallop  Breast Exam:    Deferred to GYN  Abdomen:     Soft, non-tender, nondistended, normoactive bowel sounds,    no masses, no hepatosplenomegaly  Genitalia:    Deferred to GYN     Extremities:   No clubbing,  cyanosis or edema.   Pulses:   2+ and symmetric all extremities  Skin:   Skin color, texture, turgor normal, no rashes or lesions  Lymph nodes:   Cervical, supraclavicular, and axillary nodes normal  Neurologic:   CNII-XII intact, normal strength, sensation and gait; reflexes 2+ and symmetric throughout                 Psych:   Normal mood, affect, hygiene and grooming   Lab Results  Component Value Date   WBC 6.3 02/23/2016   HGB 11.5 (L) 02/23/2016   HCT 35.3 02/23/2016   MCV 103.2 (H) 02/23/2016   PLT 341 02/23/2016   Lab Results  Component Value Date   CHOL 175 02/23/2016   HDL 77 02/23/2016   LDLCALC 87 02/23/2016   TRIG 57 02/23/2016   CHOLHDL 2.3 02/23/2016   Lab Results  Component Value Date   TSH 1.69 02/23/2016     Chemistry      Component Value Date/Time   NA 138 12/17/2015 1932   NA 138 12/09/2015 0748   K 4.2 12/17/2015 1932   K 4.6 12/09/2015 0748   CL 104 12/17/2015 1932   CO2 26 12/17/2015 1932   CO2 27 12/09/2015 0748   BUN 18 12/17/2015 1932   BUN 14.0 12/09/2015 0748   CREATININE 0.72 12/17/2015 1932   CREATININE 0.9 12/09/2015 0748      Component Value Date/Time   CALCIUM 9.8 12/17/2015 1932   CALCIUM 9.6 12/09/2015 0748   ALKPHOS 33 (L) 01/27/2016 1130   ALKPHOS 37 (L) 12/09/2015 0748   AST 28 01/27/2016 1130   AST 20 12/09/2015 0748   ALT 30 01/27/2016 1130   ALT 16 12/09/2015 0748   BILITOT 0.5 01/27/2016 1130   BILITOT 0.51 12/09/2015 0748     Vitamin D-OH 35 Fasting glucose 66   ASSESSMENT/PLAN:  Annual physical exam - Plan: POCT Urinalysis Dipstick  Need for prophylactic vaccination and inoculation against influenza - Plan: Flu Vaccine QUAD 36+ mos PF IM (Fluarix & Fluzone Quad PF)  Bigeminy - asymptomatic, normal 2 mos ago.  Per pt, will forward EKG to Dr. Nadyne Coombes - Plan: EKG 12-Lead  Essential hypertension, benign - BP's are good off the Lotrel. Continue to stay off med, monitor BP's regularly; avoid decongestants. Continue  healthy diet, weight loss, exercise.  Pure hyperglyceridemia - significantly improved with diet change; trial off meds.  return for lab visit in 2 months  Neuropathy ([place]) - currently well controlled on lyrica  Vitamin D deficiency - adequately replaced; continue daily supplementation (MVI)  Chronic fatigue - She is inquiring about Nuvigil. Discussed somewhat, and that I don't rx. Recommend she discuss with Dr Toy Care  Chronic nonintractable headache, unspecified headache type - controlled/improved  MGUS (monoclonal gammopathy of unknown significance) - stable. f/u as planned  Fibromyalgia - pain overall controlled on her current regimen.  +ongoing fatigue  Chronic migraine without aura without status migrainosus, not intractable - improved on current regimen per Dr. Tomi Likens  Benign  paroxysmal positional vertigo, unspecified laterality - resolved. Meclizine prn   Trial off of fenofibrate. Continue fish oil and lowfat diet. Return for lab visit in 2 months May need to restart or lower dose if TG are high again.   Discussed monthly self breast exams and yearly mammograms; at least 30 minutes of aerobic activity at least 5 days/week, weight-bearing exercise at least 2x/wk; proper sunscreen use reviewed; healthy diet, including goals of calcium and vitamin D intake and alcohol recommendations (less than or equal to 1 drink/day) reviewed; regular seatbelt use; changing batteries in smoke detectors.  Immunization recommendations discussed--UTD, yearly flu shots recommended, given today.  Colonoscopy recommendations reviewed, UTD.  Total visit time was over 60 minutes. Significant counseling, answering questions, and coordination of care.

## 2016-03-02 ENCOUNTER — Ambulatory Visit (INDEPENDENT_AMBULATORY_CARE_PROVIDER_SITE_OTHER): Payer: Medicare Other | Admitting: Family Medicine

## 2016-03-02 ENCOUNTER — Encounter: Payer: Self-pay | Admitting: Family Medicine

## 2016-03-02 VITALS — BP 100/60 | Temp 98.7°F | Ht 66.0 in | Wt 154.4 lb

## 2016-03-02 DIAGNOSIS — I499 Cardiac arrhythmia, unspecified: Secondary | ICD-10-CM

## 2016-03-02 DIAGNOSIS — G43709 Chronic migraine without aura, not intractable, without status migrainosus: Secondary | ICD-10-CM

## 2016-03-02 DIAGNOSIS — H8110 Benign paroxysmal vertigo, unspecified ear: Secondary | ICD-10-CM

## 2016-03-02 DIAGNOSIS — Z23 Encounter for immunization: Secondary | ICD-10-CM | POA: Diagnosis not present

## 2016-03-02 DIAGNOSIS — Z0000 Encounter for general adult medical examination without abnormal findings: Secondary | ICD-10-CM

## 2016-03-02 DIAGNOSIS — R5382 Chronic fatigue, unspecified: Secondary | ICD-10-CM | POA: Diagnosis not present

## 2016-03-02 DIAGNOSIS — E781 Pure hyperglyceridemia: Secondary | ICD-10-CM | POA: Diagnosis not present

## 2016-03-02 DIAGNOSIS — R519 Headache, unspecified: Secondary | ICD-10-CM

## 2016-03-02 DIAGNOSIS — G629 Polyneuropathy, unspecified: Secondary | ICD-10-CM | POA: Diagnosis not present

## 2016-03-02 DIAGNOSIS — M797 Fibromyalgia: Secondary | ICD-10-CM

## 2016-03-02 DIAGNOSIS — G8929 Other chronic pain: Secondary | ICD-10-CM

## 2016-03-02 DIAGNOSIS — E559 Vitamin D deficiency, unspecified: Secondary | ICD-10-CM

## 2016-03-02 DIAGNOSIS — D472 Monoclonal gammopathy: Secondary | ICD-10-CM | POA: Diagnosis not present

## 2016-03-02 DIAGNOSIS — I10 Essential (primary) hypertension: Secondary | ICD-10-CM | POA: Diagnosis not present

## 2016-03-02 DIAGNOSIS — I498 Other specified cardiac arrhythmias: Secondary | ICD-10-CM

## 2016-03-02 DIAGNOSIS — R51 Headache: Secondary | ICD-10-CM

## 2016-03-02 LAB — POCT URINALYSIS DIPSTICK
Blood, UA: NEGATIVE
Glucose, UA: NEGATIVE
Ketones, UA: NEGATIVE
Nitrite, UA: NEGATIVE
Protein, UA: NEGATIVE
Spec Grav, UA: 1.015
Urobilinogen, UA: 0.2
pH, UA: 7

## 2016-03-02 NOTE — Patient Instructions (Signed)
  HEALTH MAINTENANCE RECOMMENDATIONS:  It is recommended that you get at least 30 minutes of aerobic exercise at least 5 days/week (for weight loss, you may need as much as 60-90 minutes). This can be any activity that gets your heart rate up. This can be divided in 10-15 minute intervals if needed, but try and build up your endurance at least once a week.  Weight bearing exercise is also recommended twice weekly.  Eat a healthy diet with lots of vegetables, fruits and fiber.  "Colorful" foods have a lot of vitamins (ie green vegetables, tomatoes, red peppers, etc).  Limit sweet tea, regular sodas and alcoholic beverages, all of which has a lot of calories and sugar.  Up to 1 alcoholic drink daily may be beneficial for women (unless trying to lose weight, watch sugars).  Drink a lot of water.  Calcium recommendations are 1200-1500 mg daily (1500 mg for postmenopausal women or women without ovaries), and vitamin D 1000 IU daily.  This should be obtained from diet and/or supplements (vitamins), and calcium should not be taken all at once, but in divided doses.  Monthly self breast exams and yearly mammograms for women over the age of 75 is recommended.  Sunscreen of at least SPF 30 should be used on all sun-exposed parts of the skin when outside between the hours of 10 am and 4 pm (not just when at beach or pool, but even with exercise, golf, tennis, and yard work!)  Use a sunscreen that says "broad spectrum" so it covers both UVA and UVB rays, and make sure to reapply every 1-2 hours.  Remember to change the batteries in your smoke detectors when changing your clock times in the spring and fall.  Use your seat belt every time you are in a car, and please drive safely and not be distracted with cell phones and texting while driving.    Okay to remain off your blood pressure medication, but please monitor it regularly. Avoid decongestants which can raise the blood pressure. Use Coricidin HBP and  sinus rinses if needed. Trial of stopping the fenofibrate. Continue the fish oil and lowfat diet Return in 2 months for fasting labs, and we will let you know if it is okay to remain off this medication.

## 2016-03-05 ENCOUNTER — Encounter: Payer: Self-pay | Admitting: Family Medicine

## 2016-03-08 ENCOUNTER — Telehealth: Payer: Self-pay | Admitting: Family Medicine

## 2016-03-08 DIAGNOSIS — G629 Polyneuropathy, unspecified: Secondary | ICD-10-CM

## 2016-03-08 MED ORDER — PREGABALIN 75 MG PO CAPS: 75.0000 mg | ORAL_CAPSULE | Freq: Two times a day (BID) | ORAL | 1 refills | Status: DC

## 2016-03-08 NOTE — Telephone Encounter (Signed)
Pt stated that her Lyrica dosage has changed back to 75mg  bid,  Need new printed Rx to fax to T# [phone]

## 2016-03-08 NOTE — Telephone Encounter (Signed)
Done

## 2016-03-08 NOTE — Telephone Encounter (Signed)
I pended order (cannot do electronically).  Please call in (75mg  BID #[id number] [id number] x 1)

## 2016-03-09 NOTE — Telephone Encounter (Signed)
New Rx was faxed to [phone] for dosage change to [employer]

## 2016-03-13 ENCOUNTER — Encounter: Payer: Self-pay | Admitting: Family Medicine

## 2016-03-17 ENCOUNTER — Encounter: Payer: Self-pay | Admitting: Family Medicine

## 2016-03-31 ENCOUNTER — Other Ambulatory Visit: Payer: Self-pay | Admitting: Family Medicine

## 2016-03-31 DIAGNOSIS — Z1231 Encounter for screening mammogram for malignant neoplasm of breast: Secondary | ICD-10-CM

## 2016-04-07 DIAGNOSIS — I428 Other cardiomyopathies: Secondary | ICD-10-CM

## 2016-04-07 HISTORY — DX: Other cardiomyopathies: I42.8

## 2016-04-10 ENCOUNTER — Encounter: Payer: Self-pay | Admitting: Family Medicine

## 2016-04-10 DIAGNOSIS — I428 Other cardiomyopathies: Secondary | ICD-10-CM | POA: Insufficient documentation

## 2016-04-16 ENCOUNTER — Other Ambulatory Visit: Payer: Self-pay | Admitting: Family Medicine

## 2016-04-23 ENCOUNTER — Other Ambulatory Visit: Payer: Self-pay | Admitting: Family Medicine

## 2016-05-01 ENCOUNTER — Other Ambulatory Visit: Payer: Medicare Other

## 2016-05-03 ENCOUNTER — Other Ambulatory Visit: Payer: Medicare Other

## 2016-05-03 DIAGNOSIS — E781 Pure hyperglyceridemia: Secondary | ICD-10-CM

## 2016-05-03 LAB — LIPID PANEL
Cholesterol: 190 mg/dL (ref 125–200)
HDL: 65 mg/dL (ref >=46)
LDL Cholesterol: 104 mg/dL (ref <130)
Total CHOL/HDL Ratio: 2.9 Ratio (ref <=5.0)
Triglycerides: 103 mg/dL (ref <150)
VLDL: 21 mg/dL (ref <30)

## 2016-05-08 ENCOUNTER — Ambulatory Visit: Payer: Medicare Other

## 2016-05-10 ENCOUNTER — Ambulatory Visit
Admission: RE | Admit: 2016-05-10 | Discharge: 2016-05-10 | Disposition: A | Discharge status: Home / Self Care | Payer: Medicare Other | Source: Ambulatory Visit | Attending: Family Medicine | Admitting: Family Medicine

## 2016-05-10 DIAGNOSIS — Z1231 Encounter for screening mammogram for malignant neoplasm of breast: Secondary | ICD-10-CM

## 2016-05-15 DIAGNOSIS — M47816 Spondylosis without myelopathy or radiculopathy, lumbar region: Secondary | ICD-10-CM | POA: Insufficient documentation

## 2016-05-15 DIAGNOSIS — N29 Other disorders of kidney and ureter in diseases classified elsewhere: Secondary | ICD-10-CM

## 2016-05-15 DIAGNOSIS — M67911 Unspecified disorder of synovium and tendon, right shoulder: Secondary | ICD-10-CM | POA: Insufficient documentation

## 2016-05-15 DIAGNOSIS — C4361 Malignant melanoma of right upper limb, including shoulder: Secondary | ICD-10-CM | POA: Insufficient documentation

## 2016-05-15 DIAGNOSIS — M1120 Other chondrocalcinosis, unspecified site: Secondary | ICD-10-CM | POA: Insufficient documentation

## 2016-05-15 DIAGNOSIS — M722 Plantar fascial fibromatosis: Secondary | ICD-10-CM | POA: Insufficient documentation

## 2016-05-15 DIAGNOSIS — M170 Bilateral primary osteoarthritis of knee: Secondary | ICD-10-CM | POA: Insufficient documentation

## 2016-05-15 NOTE — Progress Notes (Signed)
Office Visit Note  Patient: Lindsey Rodriguez             Date of Birth: [birth year]           MRN: [id number]             PCP: [name], MD Referring: Rita Ohara, MD Visit Date: 05/17/2016 Occupation: Disability    Subjective:  Fatigue   History of Present Illness: Lindsey Rodriguez is a 50 y.o. female returns today after her last visit in June 2017. She states that she and her husband has been following' the next 77 days'diet and experienced very good results. She's been having some lower back pain for which she's has had facet joint injections at pain management. She states that her doctor also notices that she had right foot drop for which she'll be seeing Dr. Vertell Limber in November. She continues to have some discomfort in her right shoulder, right knee joint and thoracic region. She denies any joint swelling. She states in May 2017 after undergoing hysterectomy and bladder sling surgery she had difficulty getting off Foley catheter. she was seen by a urologist who found him right renal stone. While she was undergoing lithotripsy she started having some PVCs with bigeminy. The procedure was stopped and she was referred to cardiologist. She states after cardiology workup she was diagnosed with nonischemic cardiomyopathy her ejection fraction was 33% per patient. She's been started on lisinopril and carvedilol.  Activities of Daily Living:  Patient reports morning stiffness for 10 minutes.   Patient Reports nocturnal pain.  Difficulty dressing/grooming: Denies Difficulty climbing stairs: Reports Difficulty getting out of chair: Denies Difficulty using hands for taps, buttons, cutlery, and/or writing: Denies   Review of Systems  Constitutional: Positive for fatigue. Negative for night sweats, weight gain, weight loss and weakness.  HENT: Negative for mouth sores, trouble swallowing, trouble swallowing, mouth dryness and nose dryness.   Eyes: Negative for pain, redness, visual  disturbance and dryness.  Respiratory: Negative for cough, shortness of breath and difficulty breathing.   Cardiovascular: Positive for palpitations and irregular heartbeat. Negative for chest pain, hypertension and swelling in legs/feet.  Gastrointestinal: Negative for blood in stool, constipation and diarrhea.  Endocrine: Negative for increased urination.  Genitourinary: Negative for vaginal dryness.  Musculoskeletal: Positive for arthralgias, joint pain and morning stiffness. Negative for joint swelling, myalgias, muscle weakness, muscle tenderness and myalgias.  Skin: Negative for color change, rash, hair loss, skin tightness, ulcers and sensitivity to sunlight.  Allergic/Immunologic: Negative for susceptible to infections.  Neurological: Negative for dizziness, memory loss and night sweats.  Hematological: Negative for swollen glands.  Psychiatric/Behavioral: Positive for depressed mood. Negative for sleep disturbance. The patient is nervous/anxious.     PMFS History:  Patient Active Problem List   Diagnosis Date Noted  . Chondrocalcinosis 05/15/2016  . Primary osteoarthritis of both knees 05/15/2016  . Tendinopathy of right shoulder 05/15/2016  . Spondylosis of lumbar region 05/15/2016  . Plantar fasciitis, bilateral 05/15/2016  . Renal calcinosis 05/15/2016  . Malignant melanoma of right upper extremity including shoulder ([place]) 05/15/2016  . Nonischemic cardiomyopathy ([place]) 04/10/2016  . Post op infection 12/18/2015  . Postoperative state 11/24/2015  . Chronic migraine without aura without status migrainosus, not intractable 08/04/2015  . Fibromyalgia 07/26/2015  . MGUS (monoclonal gammopathy of unknown significance) 06/10/2015  . DJD (degenerative joint disease), cervical 03/02/2015  . Migraine headache 09/05/2012  . Fatigue 10/26/2011  . Pure hyperglyceridemia 04/27/2011  . Essential hypertension, benign 04/17/2011  .  Vitamin D deficiency 04/17/2011  . Neuropathy ([place])  12/22/2010  . Headache 12/22/2010    Past Medical History:  Diagnosis Date  . Anxiety panic attacks   [name]  . Arthritis    knees  . Cardiomyopathy, nonischemic ([place]) 04/07/2016   EF left ventricule calculated on Lexiscan stress test at 33% with global hypokinesis.   . Chronic back pain   . Chronic neck pain   . Depression   . Fibromyalgia    and neuropathy of feet  . Hyperlipidemia   . Hypertension   . Kidney stone    History  . Kidney stones h/o  . Melanoma ([place]) RUE   [name]  . MGUS (monoclonal gammopathy of unknown significance) 06/2015   no treatment just watching recheck blood work 12/2015  . Migraine    OTC med prn - stress related  . Plantar fasciitis right  . Seasonal allergies   . SVD (spontaneous vaginal delivery)    x 3  . Tendonitis of foot right    Family History  Problem Relation Age of Onset  . Hypertension Mother   . Depression Mother   . Migraines Mother   . Hypothyroidism Mother   . COPD Father   . Heart failure Father 66  . Breast cancer Cousin     40's  . Cancer Cousin     breast; in her 21's  . Diabetes Neg Hx    Past Surgical History:  Procedure Laterality Date  . ABLATION ON ENDOMETRIOSIS N/A 11/24/2015   Procedure: ABLATION ON ENDOMETRIOSIS;  Surgeon: [name], MD;  Location: [facility];  Service: Gynecology;  Laterality: N/A;  . BACK SURGERY  Dr.Stern, last sx Summer 2011   X 4  (L3-S1)  . BLADDER SUSPENSION N/A 11/24/2015   Procedure: TRANSVAGINAL TAPE (TVT) PROCEDURE;  Surgeon: [name], MD;  Location: [facility];  Service: Gynecology;  Laterality: N/A;  . COLONOSCOPY    . CYSTOCELE REPAIR  11/24/2015   Procedure: ANTERIOR REPAIR (CYSTOCELE);  Surgeon: [name], MD;  Location: [facility];  Service: Gynecology;;  . Consuela Mimes N/A 11/24/2015   Procedure: CYSTOSCOPY;  Surgeon: [name], MD;  Location: [facility];  Service: Gynecology;  Laterality: N/A;  . ENDOMETRIAL ABLATION  08/2000  . EYE SURGERY Bilateral     Lasik   . KIDNEY STONE SURGERY  2011  . MELANOMA EXCISION  05/2015   right forearm; [name]  . NECK SURGERY  [name]  Summer 2011   X 4  (C4-T1 Level)  . RHINOPLASTY    . ROBOTIC ASSISTED BILATERAL SALPINGO OOPHERECTOMY Bilateral 11/24/2015   Procedure: ROBOTIC ASSISTED BILATERAL SALPINGO OOPHORECTOMY;  Surgeon: [name], MD;  Location: [facility];  Service: Gynecology;  Laterality: Bilateral;  . ROBOTIC ASSISTED TOTAL HYSTERECTOMY WITH SALPINGECTOMY Bilateral 11/24/2015   Procedure: ROBOTIC ASSISTED TOTAL HYSTERECTOMY WITH SALPINGECTOMY;  Surgeon: [name], MD;  Location: [facility];  Service: Gynecology;  Laterality: Bilateral;  . TONSILLECTOMY    . WISDOM TOOTH EXTRACTION     Social History   Social History Narrative   Married.  Lives with husband, 1 daughter (in college), son, brother-in-law (with Down's syndrome)--moving into Memory care 02/2016.    1 dog.   1 daughter in [place], [place].   Granddaughter in [place] (with her mother)   Disabled due to anxiety.  Previously worked as Public relations account executive.     Objective: Vital Signs: BP 111/65 (BP Location: Left Arm, Patient Position: Sitting, Cuff Size: Large)   Pulse 65  Resp 14   Ht 5' 6"  (1.676 m)   Wt 150 lb (68 kg)   LMP 12/16/2012   BMI 24.21 kg/m    Physical Exam  Constitutional: She is oriented to person, place, and time. She appears well-developed and well-nourished.  HENT:  Head: Normocephalic and atraumatic.  Eyes: Conjunctivae and EOM are normal.  Neck: Normal range of motion.  Cardiovascular: Normal rate, regular rhythm, normal heart sounds and intact distal pulses.   Pulmonary/Chest: Effort normal and breath sounds normal.  Abdominal: Soft. Bowel sounds are normal.  Lymphadenopathy:    She has no cervical adenopathy.  Neurological: She is alert and oriented to person, place, and time.  Skin: Skin is warm and dry. Capillary refill takes less than 2 seconds.  Psychiatric: She has a normal mood  and affect. Her behavior is normal.  Nursing note and vitals reviewed.    Musculoskeletal Exam: C-spine, thoracic spine, lumbar spine good range of motion she is some discomfort with range of motion of her C-spine and lumbar spine. Shoulder joints, right elbow joints, wrist joints, MCPs good range of motion she has some thickening of PIP/DIP joints in her hands some consistent with early osteoarthritic changes. She has good range of motion of her bilateral hip joints, knee joints, ankle joints and MTPs without any synovitis. Fibromyalgia tender points were 18 out of 18 positive.   CDAI Exam: CDAI Homunculus Exam:   Joint Counts:  CDAI Tender Joint count: 0 CDAI Swollen Joint count: 0     Investigation: Findings:  02/05/2015 iron studies normal, ESR 7, uric acid 5.3, magnesium 2.0, CK 135, ace negative, immunoglobulins normal, rheumatoid factor negative, ANA negative, SPEP normal, CCP negative, HLA-B27 positive, vitamin D 26, IFE showed monoclonal IgG lambda protein    Imaging: Mm Screening Breast Tomo Bilateral  Result Date: 05/11/2016 CLINICAL DATA:  Screening. EXAM: 2D DIGITAL SCREENING BILATERAL MAMMOGRAM WITH CAD AND ADJUNCT TOMO COMPARISON:  Previous exam(s). ACR Breast Density Category c: The breast tissue is heterogeneously dense, which may obscure small masses. FINDINGS: There are no findings suspicious for malignancy. Images were processed with CAD. IMPRESSION: No mammographic evidence of malignancy. A result letter of this screening mammogram will be mailed directly to the patient. RECOMMENDATION: Screening mammogram in one year. (Code:SM-B-01Y) BI-RADS CATEGORY  1: Negative. Electronically Signed   By: Claudie Revering M.D.   On: 05/11/2016 15:17    Speciality Comments: No specialty comments available.    Procedures:  No procedures performed Allergies: Penicillins; Codeine; Voltaren [diclofenac]; and Erythromycin   Assessment / Plan: Visit Diagnoses: Chondrocalcinosis:  She has not had any flare recently.  Primary osteoarthritis of both knees - Moderate. Joint protection and muscle strengthening exercises were discussed.  Tendinopathy of right shoulder: I would avoid cortisone injection have given her a handout on shoulder joint exercises.  DJD (degenerative joint disease), cervical - Status post fusion and discectomy Dr. Vertell Limber: She continues to have some lower back discomfort and now she has symptoms of right foot drop. She is appointment coming up with Dr. Vertell Limber.  Spondylosis of lumbar region - Status post fusion and discectomy Dr. Vertell Limber  Plantar fasciitis, bilateral - Bilateral calcaneal spurs, HLA-B27 positive: Symptoms have improved.  Fibromyalgia: She is doing much better after she's been on new diet.  Her other medical problems are as follows for which she's seen other doctors:  Essential hypertension, benign  Vitamin D deficiency  Renal calcinosis  MGUS (monoclonal gammopathy of unknown significance)  Malignant melanoma of right upper extremity  including shoulder [facility]) - Resection November 2016 [name]  Nonischemic cardiomyopathy [facility])      Face-to-face time spent with patient was 30 minutes. 50% of time was spent in counseling and coordination of care.  Follow-Up Instructions: Return in about 1 year (around 05/17/2017) for Osteoarthritis, chondrocalcinosis, cardiomyopathy (sees dr Einar Gip).   Bo Merino, MD

## 2016-05-16 NOTE — Telephone Encounter (Signed)
Called [phone] & reordered Lyrica to be shipped 05/30/16 [phone]

## 2016-05-17 ENCOUNTER — Ambulatory Visit (INDEPENDENT_AMBULATORY_CARE_PROVIDER_SITE_OTHER): Payer: Medicare Other | Admitting: Rheumatology

## 2016-05-17 ENCOUNTER — Encounter: Payer: Self-pay | Admitting: Rheumatology

## 2016-05-17 VITALS — BP 111/65 | HR 65 | Resp 14 | Ht 66.0 in | Wt 150.0 lb

## 2016-05-17 DIAGNOSIS — D472 Monoclonal gammopathy: Secondary | ICD-10-CM

## 2016-05-17 DIAGNOSIS — E559 Vitamin D deficiency, unspecified: Secondary | ICD-10-CM | POA: Diagnosis not present

## 2016-05-17 DIAGNOSIS — I10 Essential (primary) hypertension: Secondary | ICD-10-CM | POA: Diagnosis not present

## 2016-05-17 DIAGNOSIS — M170 Bilateral primary osteoarthritis of knee: Secondary | ICD-10-CM | POA: Diagnosis not present

## 2016-05-17 DIAGNOSIS — M722 Plantar fascial fibromatosis: Secondary | ICD-10-CM | POA: Diagnosis not present

## 2016-05-17 DIAGNOSIS — C4361 Malignant melanoma of right upper limb, including shoulder: Secondary | ICD-10-CM | POA: Diagnosis not present

## 2016-05-17 DIAGNOSIS — M797 Fibromyalgia: Secondary | ICD-10-CM | POA: Diagnosis not present

## 2016-05-17 DIAGNOSIS — M1120 Other chondrocalcinosis, unspecified site: Secondary | ICD-10-CM | POA: Diagnosis not present

## 2016-05-17 DIAGNOSIS — M47816 Spondylosis without myelopathy or radiculopathy, lumbar region: Secondary | ICD-10-CM

## 2016-05-17 DIAGNOSIS — M47812 Spondylosis without myelopathy or radiculopathy, cervical region: Secondary | ICD-10-CM

## 2016-05-17 DIAGNOSIS — N29 Other disorders of kidney and ureter in diseases classified elsewhere: Secondary | ICD-10-CM

## 2016-05-17 DIAGNOSIS — M5030 Other cervical disc degeneration, unspecified cervical region: Secondary | ICD-10-CM | POA: Diagnosis not present

## 2016-05-17 DIAGNOSIS — M67911 Unspecified disorder of synovium and tendon, right shoulder: Secondary | ICD-10-CM

## 2016-05-17 DIAGNOSIS — I428 Other cardiomyopathies: Secondary | ICD-10-CM

## 2016-05-17 NOTE — Patient Instructions (Signed)
Knee Exercises Ask your health care provider which exercises are safe for you. Do exercises exactly as told by your health care provider and adjust them as directed. It is normal to feel mild stretching, pulling, tightness, or discomfort as you do these exercises, but you should stop right away if you feel sudden pain or your pain gets worse.Do not begin these exercises until told by your health care provider. STRETCHING AND RANGE OF MOTION EXERCISES  These exercises warm up your muscles and joints and improve the movement and flexibility of your knee. These exercises also help to relieve pain, numbness, and tingling. Exercise A: Knee Extension, Prone 1. Lie on your abdomen on a bed. 2. Place your left / right knee just beyond the edge of the surface so your knee is not on the bed. You can put a towel under your left / right thigh just above your knee for comfort. 3. Relax your leg muscles and allow gravity to straighten your knee. You should feel a stretch behind your left / right knee. 4. Hold this position for __________ seconds. 5. Scoot up so your knee is supported between repetitions. Repeat __________ times. Complete this stretch __________ times a day. Exercise B: Knee Flexion, Active  1. Lie on your back with both knees straight. If this causes back discomfort, bend your left / right knee so your foot is flat on the floor. 2. Slowly slide your left / right heel back toward your buttocks until you feel a gentle stretch in the front of your knee or thigh. 3. Hold this position for __________ seconds. 4. Slowly slide your left / right heel back to the starting position. Repeat __________ times. Complete this exercise __________ times a day. Exercise C: Quadriceps, Prone  1. Lie on your abdomen on a firm surface, such as a bed or padded floor. 2. Bend your left / right knee and hold your ankle. If you cannot reach your ankle or pant leg, loop a belt around your foot and grab the belt  instead. 3. Gently pull your heel toward your buttocks. Your knee should not slide out to the side. You should feel a stretch in the front of your thigh and knee. 4. Hold this position for __________ seconds. Repeat __________ times. Complete this stretch __________ times a day. Exercise D: Hamstring, Supine 1. Lie on your back. 2. Loop a belt or towel over the ball of your left / right foot. The ball of your foot is on the walking surface, right under your toes. 3. Straighten your left / right knee and slowly pull on the belt to raise your leg until you feel a gentle stretch behind your knee.  Do not let your left / right knee bend while you do this.  Keep your other leg flat on the floor. 4. Hold this position for __________ seconds. Repeat __________ times. Complete this stretch __________ times a day. STRENGTHENING EXERCISES  These exercises build strength and endurance in your knee. Endurance is the ability to use your muscles for a long time, even after they get tired. Exercise E: Quadriceps, Isometric  1. Lie on your back with your left / right leg extended and your other knee bent. Put a rolled towel or small pillow under your knee if told by your health care provider. 2. Slowly tense the muscles in the front of your left / right thigh. You should see your kneecap slide up toward your hip or see increased dimpling just above the  knee. This motion will push the back of the knee toward the floor. 3. For __________ seconds, keep the muscle as tight as you can without increasing your pain. 4. Relax the muscles slowly and completely. Repeat __________ times. Complete this exercise __________ times a day. Exercise F: Straight Leg Raises - Quadriceps 1. Lie on your back with your left / right leg extended and your other knee bent. 2. Tense the muscles in the front of your left / right thigh. You should see your kneecap slide up or see increased dimpling just above the knee. Your thigh may  even shake a bit. 3. Keep these muscles tight as you raise your leg 4-6 inches (10-15 cm) off the floor. Do not let your knee bend. 4. Hold this position for __________ seconds. 5. Keep these muscles tense as you lower your leg. 6. Relax your muscles slowly and completely after each repetition. Repeat __________ times. Complete this exercise __________ times a day. Exercise G: Hamstring, Isometric 1. Lie on your back on a firm surface. 2. Bend your left / right knee approximately __________ degrees. 3. Dig your left / right heel into the surface as if you are trying to pull it toward your buttocks. Tighten the muscles in the back of your thighs to dig as hard as you can without increasing any pain. 4. Hold this position for __________ seconds. 5. Release the tension gradually and allow your muscles to relax completely for __________ seconds after each repetition. Repeat __________ times. Complete this exercise __________ times a day. Exercise H: Hamstring Curls  If told by your health care provider, do this exercise while wearing ankle weights. Begin with __________ weights. Then increase the weight by 1 lb (0.5 kg) increments. Do not wear ankle weights that are more than __________. 1. Lie on your abdomen with your legs straight. 2. Bend your left / right knee as far as you can without feeling pain. Keep your hips flat against the floor. 3. Hold this position for __________ seconds. 4. Slowly lower your leg to the starting position. Repeat __________ times. Complete this exercise __________ times a day. Exercise I: Squats (Quadriceps) 1. Stand in front of a table, with your feet and knees pointing straight ahead. You may rest your hands on the table for balance but not for support. 2. Slowly bend your knees and lower your hips like you are going to sit in a chair.  Keep your weight over your heels, not over your toes.  Keep your lower legs upright so they are parallel with the table  legs.  Do not let your hips go lower than your knees.  Do not bend lower than told by your health care provider.  If your knee pain increases, do not bend as low. 3. Hold the squat position for __________ seconds. 4. Slowly push with your legs to return to standing. Do not use your hands to pull yourself to standing. Repeat __________ times. Complete this exercise __________ times a day. Exercise J: Wall Slides (Quadriceps)  1. Lean your back against a smooth wall or door while you walk your feet out 18-24 inches (46-61 cm) from it. 2. Place your feet hip-width apart. 3. Slowly slide down the wall or door until your knees bend __________ degrees. Keep your knees over your heels, not over your toes. Keep your knees in line with your hips. 4. Hold for __________ seconds. Repeat __________ times. Complete this exercise __________ times a day. Exercise K: Straight Leg Raises -  Hip Abductors 1. Lie on your side with your left / right leg in the top position. Lie so your head, shoulder, knee, and hip line up. You may bend your bottom knee to help you keep your balance. 2. Roll your hips slightly forward so your hips are stacked directly over each other and your left / right knee is facing forward. 3. Leading with your heel, lift your top leg 4-6 inches (10-15 cm). You should feel the muscles in your outer hip lifting.  Do not let your foot drift forward.  Do not let your knee roll toward the ceiling. 4. Hold this position for __________ seconds. 5. Slowly return your leg to the starting position. 6. Let your muscles relax completely after each repetition. Repeat __________ times. Complete this exercise __________ times a day. Exercise L: Straight Leg Raises - Hip Extensors 1. Lie on your abdomen on a firm surface. You can put a pillow under your hips if that is more comfortable. 2. Tense the muscles in your buttocks and lift your left / right leg about 4-6 inches (10-15 cm). Keep your knee  straight as you lift your leg. 3. Hold this position for __________ seconds. 4. Slowly lower your leg to the starting position. 5. Let your leg relax completely after each repetition. Repeat __________ times. Complete this exercise __________ times a day. This information is not intended to replace advice given to you by your health care provider. Make sure you discuss any questions you have with your health care provider. Document Released: 05/03/2005 Document Revised: 03/13/2016 Document Reviewed: 04/25/2015 Elsevier Interactive Patient Education  2017 [employer] Shoulder Exercises Ask your health care provider which exercises are safe for you. Do exercises exactly as told by your health care provider and adjust them as directed. It is normal to feel mild stretching, pulling, tightness, or discomfort as you do these exercises, but you should stop right away if you feel sudden pain or your pain gets worse.Do not begin these exercises until told by your health care provider. RANGE OF MOTION EXERCISES  These exercises warm up your muscles and joints and improve the movement and flexibility of your shoulder. These exercises also help to relieve pain, numbness, and tingling. These exercises involve stretching your injured shoulder directly. Exercise A: Pendulum  1. Stand near a wall or a surface that you can hold onto for balance. 2. Bend at the waist and let your left / right arm hang straight down. Use your other arm to support you. Keep your back straight and do not lock your knees. 3. Relax your left / right arm and shoulder muscles, and move your hips and your trunk so your left / right arm swings freely. Your arm should swing because of the motion of your body, not because you are using your arm or shoulder muscles. 4. Keep moving your body so your arm swings in the following directions, as told by your health care provider:  Side to side.  Forward and backward.  In clockwise and  counterclockwise circles. 5. Continue each motion for __________ seconds, or for as long as told by your health care provider. 6. Slowly return to the starting position. Repeat __________ times. Complete this exercise __________ times a day. Exercise B:Flexion, Standing  1. Stand and hold a broomstick, a cane, or a similar object. Place your hands a little more than shoulder-width apart on the object. Your left / right hand should be palm-up, and your other hand should be palm-down.  2. Keep your elbow straight and keep your shoulder muscles relaxed. Push the stick down with your healthy arm to raise your left / right arm in front of your body, and then over your head until you feel a stretch in your shoulder.  Avoid shrugging your shoulder while you raise your arm. Keep your shoulder blade tucked down toward the middle of your back. 3. Hold for __________ seconds. 4. Slowly return to the starting position. Repeat __________ times. Complete this exercise __________ times a day. Exercise C: Abduction, Standing 1. Stand and hold a broomstick, a cane, or a similar object. Place your hands a little more than shoulder-width apart on the object. Your left / right hand should be palm-up, and your other hand should be palm-down. 2. While keeping your elbow straight and your shoulder muscles relaxed, push the stick across your body toward your left / right side. Raise your left / right arm to the side of your body and then over your head until you feel a stretch in your shoulder.  Do not raise your arm above shoulder height, unless your health care provider tells you to do that.  Avoid shrugging your shoulder while you raise your arm. Keep your shoulder blade tucked down toward the middle of your back. 3. Hold for __________ seconds. 4. Slowly return to the starting position. Repeat __________ times. Complete this exercise __________ times a day. Exercise D:Internal Rotation  5. Place your left /  right hand behind your back, palm-up. 6. Use your other hand to dangle an exercise band, a towel, or a similar object over your shoulder. Grasp the band with your left / right hand so you are holding onto both ends. 7. Gently pull up on the band until you feel a stretch in the front of your left / right shoulder.  Avoid shrugging your shoulder while you raise your arm. Keep your shoulder blade tucked down toward the middle of your back. 8. Hold for __________ seconds. 9. Release the stretch by letting go of the band and lowering your hands. Repeat __________ times. Complete this exercise __________ times a day. STRETCHING EXERCISES  These exercises warm up your muscles and joints and improve the movement and flexibility of your shoulder. These exercises also help to relieve pain, numbness, and tingling. These exercises are done using your healthy shoulder to help stretch the muscles of your injured shoulder. Exercise E: Warehouse manager (External Rotation and Abduction)  1. Stand in a doorway with one of your feet slightly in front of the other. This is called a staggered stance. If you cannot reach your forearms to the door frame, stand facing a corner of a room. 2. Choose one of the following positions as told by your health care provider:  Place your hands and forearms on the door frame above your head.  Place your hands and forearms on the door frame at the height of your head.  Place your hands on the door frame at the height of your elbows. 3. Slowly move your weight onto your front foot until you feel a stretch across your chest and in the front of your shoulders. Keep your head and chest upright and keep your abdominal muscles tight. 4. Hold for __________ seconds. 5. To release the stretch, shift your weight to your back foot. Repeat __________ times. Complete this stretch __________ times a day. Exercise F:Extension, Standing 1. Stand and hold a broomstick, a cane, or a similar  object behind your back.  Your hands should be a little wider than shoulder-width apart.  Your palms should face away from your back. 2. Keeping your elbows straight and keeping your shoulder muscles relaxed, move the stick away from your body until you feel a stretch in your shoulder.  Avoid shrugging your shoulders while you move the stick. Keep your shoulder blade tucked down toward the middle of your back. 3. Hold for __________ seconds. 4. Slowly return to the starting position. Repeat __________ times. Complete this exercise __________ times a day. STRENGTHENING EXERCISES  These exercises build strength and endurance in your shoulder. Endurance is the ability to use your muscles for a long time, even after they get tired. Exercise G:External Rotation  6. Sit in a stable chair without armrests. 7. Secure an exercise band at elbow height on your left / right side. 8. Place a soft object, such as a folded towel or a small pillow, between your left / right upper arm and your body to move your elbow a few inches away (about 10 cm) from your side. 9. Hold the end of the band so it is tight and there is no slack. 10. Keeping your elbow pressed against the soft object, move your left / right forearm out, away from your abdomen. Keep your body steady so only your forearm moves. 11. Hold for __________ seconds. 12. Slowly return to the starting position. Repeat __________ times. Complete this exercise __________ times a day. Exercise H:Shoulder Abduction  1. Sit in a stable chair without armrests, or stand. 2. Hold a __________ weight in your left / right hand, or hold an exercise band with both hands. 3. Start with your arms straight down and your left / right palm facing in, toward your body. 4. Slowly lift your left / right hand out to your side. Do not lift your hand above shoulder height unless your health care provider tells you that this is safe.  Keep your arms straight.  Avoid  shrugging your shoulder while you do this movement. Keep your shoulder blade tucked down toward the middle of your back. 5. Hold for __________ seconds. 6. Slowly lower your arm, and return to the starting position. Repeat __________ times. Complete this exercise __________ times a day. Exercise I:Shoulder Extension 1. Sit in a stable chair without armrests, or stand. 2. Secure an exercise band to a stable object in front of you where it is at shoulder height. 3. Hold one end of the exercise band in each hand. Your palms should face each other. 4. Straighten your elbows and lift your hands up to shoulder height. 5. Step back, away from the secured end of the exercise band, until the band is tight and there is no slack. 6. Squeeze your shoulder blades together as you pull your hands down to the sides of your thighs. Stop when your hands are straight down by your sides. Do not let your hands go behind your body. 7. Hold for __________ seconds. 8. Slowly return to the starting position. Repeat __________ times. Complete this exercise __________ times a day. Exercise J:Standing Shoulder Row 5. Sit in a stable chair without armrests, or stand. 6. Secure an exercise band to a stable object in front of you so it is at waist height. 7. Hold one end of the exercise band in each hand. Your palms should be in a thumbs-up position. 8. Bend each of your elbows to an "L" shape (about 90 degrees) and keep your upper arms at your sides.  9. Step back until the band is tight and there is no slack. 10. Slowly pull your elbows back behind you. 11. Hold for __________ seconds. 12. Slowly return to the starting position. Repeat __________ times. Complete this exercise __________ times a day. Exercise K:Shoulder Press-Ups  7. Sit in a stable chair that has armrests. Sit upright, with your feet flat on the floor. 8. Put your hands on the armrests so your elbows are bent and your fingers are pointing forward.  Your hands should be about even with the sides of your body. 9. Push down on the armrests and use your arms to lift yourself off of the chair. Straighten your elbows and lift yourself up as much as you comfortably can.  Move your shoulder blades down, and avoid letting your shoulders move up toward your ears.  Keep your feet on the ground. As you get stronger, your feet should support less of your body weight as you lift yourself up. 10. Hold for __________ seconds. 11. Slowly lower yourself back into the chair. Repeat __________ times. Complete this exercise __________ times a day. Exercise L: Wall Push-Ups  6. Stand so you are facing a stable wall. Your feet should be about one arm-length away from the wall. 7. Lean forward and place your palms on the wall at shoulder height. 8. Keep your feet flat on the floor as you bend your elbows and lean forward toward the wall. 9. Hold for __________ seconds. 10. Straighten your elbows to push yourself back to the starting position. Repeat __________ times. Complete this exercise __________ times a day. This information is not intended to replace advice given to you by your health care provider. Make sure you discuss any questions you have with your health care provider. Document Released: 05/03/2005 Document Revised: 03/13/2016 Document Reviewed: 02/28/2015 Elsevier Interactive Patient Education  2017 [employer].

## 2016-05-31 ENCOUNTER — Other Ambulatory Visit: Payer: Self-pay | Admitting: Neurosurgery

## 2016-06-02 ENCOUNTER — Encounter: Payer: Self-pay | Admitting: Hematology

## 2016-06-02 HISTORY — PX: BACK SURGERY: SHX140

## 2016-06-04 ENCOUNTER — Telehealth: Payer: Self-pay | Admitting: Hematology

## 2016-06-04 NOTE — Telephone Encounter (Signed)
Lvm advising appt 12/8 moved to 12/12 @ 1pm due to md out of office.

## 2016-06-08 NOTE — Pre-Procedure Instructions (Signed)
    Lindsey Rodriguez  06/08/2016      [id number] - [place], [place] - [address] [place] [address] Phone: [phone] Fax: [phone]    Your procedure is scheduled on : December 14th, Thursday   [facility]             ( posted surgery time 2:08 pm - 4:56 pm)   Call this number if you have problems the morning of surgery:  [phone]   Remember:  Do not eat food or drink liquids after midnight Wednesday.   Take these medicines the morning of surgery with A SIP OF WATER : Coreg, Depakote, Prozac, Ativan,              4-5 days prior to surgery, STOP taking herbal supplements, vitamins, anti-inflammatories   Do not wear jewelry, make-up or nail polish.  Do not wear lotions, powders,  perfumes, or deoderant.   Do not shave underarms & legs 48 hours prior to surgery.   Do not bring valuables to the hospital.  [facility] is not responsible for any belongings or valuables.  Contacts, dentures or bridgework may not be worn into surgery.  Leave your suitcase in the car.  After surgery it may be brought to your room.  For patients admitted to the hospital, discharge time will be determined by your treatment team.  Please read over the following fact sheets that you were given. Pain Booklet, MRSA Information and Surgical Site Infection Prevention

## 2016-06-09 ENCOUNTER — Other Ambulatory Visit: Payer: Medicare Other

## 2016-06-09 ENCOUNTER — Encounter (HOSPITAL_COMMUNITY)
Admission: RE | Admit: 2016-06-09 | Discharge: 2016-06-09 | Disposition: A | Discharge status: Home / Self Care | Payer: Medicare Other | Source: Ambulatory Visit | Attending: Neurosurgery | Admitting: Neurosurgery

## 2016-06-09 ENCOUNTER — Encounter (HOSPITAL_COMMUNITY): Payer: Self-pay

## 2016-06-09 ENCOUNTER — Ambulatory Visit (HOSPITAL_COMMUNITY)
Admission: RE | Admit: 2016-06-09 | Discharge: 2016-06-09 | Disposition: A | Discharge status: Home / Self Care | Payer: Medicare Other | Source: Ambulatory Visit | Attending: Anesthesiology | Admitting: Anesthesiology

## 2016-06-09 ENCOUNTER — Ambulatory Visit: Payer: Medicare Other | Admitting: Hematology

## 2016-06-09 DIAGNOSIS — Z01810 Encounter for preprocedural cardiovascular examination: Secondary | ICD-10-CM | POA: Insufficient documentation

## 2016-06-09 DIAGNOSIS — Z01818 Encounter for other preprocedural examination: Secondary | ICD-10-CM | POA: Diagnosis present

## 2016-06-09 DIAGNOSIS — Z01811 Encounter for preprocedural respiratory examination: Secondary | ICD-10-CM

## 2016-06-09 DIAGNOSIS — Z01812 Encounter for preprocedural laboratory examination: Secondary | ICD-10-CM | POA: Diagnosis not present

## 2016-06-09 HISTORY — DX: Family history of other specified conditions: Z84.89

## 2016-06-09 HISTORY — DX: Personal history of urinary calculi: Z87.442

## 2016-06-09 HISTORY — DX: Anemia, unspecified: D64.9

## 2016-06-09 HISTORY — DX: Cardiac arrhythmia, unspecified: I49.9

## 2016-06-09 LAB — COMPREHENSIVE METABOLIC PANEL
ALT: 26 U/L (ref 14–54)
AST: 22 U/L (ref 15–41)
Albumin: 3.9 g/dL (ref 3.5–5.0)
Alkaline Phosphatase: 54 U/L (ref 38–126)
Anion gap: 7 (ref 5–15)
BUN: 10 mg/dL (ref 6–20)
CO2: 27 mmol/L (ref 22–32)
Calcium: 9.8 mg/dL (ref 8.9–10.3)
Chloride: 102 mmol/L (ref 101–111)
Creatinine, Ser: 0.68 mg/dL (ref 0.44–1.00)
GFR calc Af Amer: >60 mL/min (ref >60)
GFR calc non Af Amer: >60 mL/min (ref >60)
Glucose, Bld: 82 mg/dL (ref 65–99)
Potassium: 4.3 mmol/L (ref 3.5–5.1)
Sodium: 136 mmol/L (ref 135–145)
Total Bilirubin: 0.5 mg/dL (ref 0.3–1.2)
Total Protein: 7.2 g/dL (ref 6.5–8.1)

## 2016-06-09 LAB — SURGICAL PCR SCREEN
MRSA, PCR: NEGATIVE
Staphylococcus aureus: POSITIVE — AB

## 2016-06-09 LAB — CBC
HCT: 40.3 % (ref 36.0–46.0)
Hemoglobin: 13.6 g/dL (ref 12.0–15.0)
MCH: 32.9 pg (ref 26.0–34.0)
MCHC: 33.7 g/dL (ref 30.0–36.0)
MCV: 97.3 fL (ref 78.0–100.0)
Platelets: 326 10*3/uL (ref 150–400)
RBC: 4.14 MIL/uL (ref 3.87–5.11)
RDW: 13.4 % (ref 11.5–15.5)
WBC: 9.6 10*3/uL (ref 4.0–10.5)

## 2016-06-09 LAB — TYPE AND SCREEN
ABO/RH(D): O POS
Antibody Screen: NEGATIVE

## 2016-06-09 NOTE — [facility] Notes (Addendum)
PCP is Dr. Kelby Aline  LOV 03/2016 Patient has been to see Dr. Einar Gip (note inside chart).  Was having some PVC"s, states they started after her litho in Sept. 2017.  Diagnosed with non-ischemiccardiomyopathy in Oct. 2017  (clearance note in chart) She's had and echo, ekg, myoview stress test 06/2016 (will request them) She currently is experiencing no cardiac problems.   List of MD's inside chart. Denies DM

## 2016-06-12 NOTE — [facility] Notes (Signed)
Anesthesia Chart Review:  Pt is a 50 year old female scheduled for L3-4 anterolateral interbody fusion with lateral plate on 624THL with Erline Levine, MD.   - PCP is Rita Ohara, MD - Cardiologist is Adrian Prows, MD who has cleared pt for surgery.   PMH includes:  Nonischemic cardiomyopathy, HTN, MGUS, hyperlipidemia, melanoma. Former smoker. BMI 23. S/p total hysterectomy, BSO 11/24/15.   Medications include: carvedilol, depakote, lisinopril  Preoperative labs reviewed.    CXR 06/09/16: No active cardiopulmonary disease.  EKG 03/21/16: Sinus rhythm. Poor R-wave progression, cannot exclude anterior infarct. Borderline low voltage complexes. Frequent PVCs with intermittent bigeminy.  Nuclear stress test 04/07/16: 1. Resting EKG NSR with frequent PVCs in the pattern of ventricular bigeminy. All voltage complexes. PVCs decreased in the recovery phase Lexiscan stress test. 2. LV cavity mildly dilated LV end-diastolic volume of [id number] mL on the rest and stress studies. EF 53% with global hypokinesis. Findings are consistent with nonischemic cardiomyopathy.  Echo 04/04/16: 1. LV cavity is normal in size. Abnormal septal wall motion due to LBBB. Severe diffuse hypokinesis. Grade II diastolic dysfunction. Diastolic dysfunction findings suggest elevated LA/LV end-diastolic pressure. Calculated EF 27%. Visual EF 30-35%. 2. LA cavity is borderline dilated at 3.8 cm long axis view. 3. Mild to moderate mitral regurgitation. 4. Mild tricuspid regurgitation. No evidence of pulmonary hypertension. 5. Compared to the study done on 04/14/2010, findings of MR and TR and cardiomyopathy are new.  If no changes, I anticipate pt can proceed with surgery as scheduled.   Willeen Cass, FNP-BC [facility]/Anesthesiology Phone: [phone] 06/12/2016 1:34 PM

## 2016-06-13 ENCOUNTER — Encounter: Payer: Self-pay | Admitting: Hematology

## 2016-06-13 ENCOUNTER — Ambulatory Visit (HOSPITAL_BASED_OUTPATIENT_CLINIC_OR_DEPARTMENT_OTHER): Payer: Medicare Other | Admitting: Hematology

## 2016-06-13 ENCOUNTER — Other Ambulatory Visit (HOSPITAL_BASED_OUTPATIENT_CLINIC_OR_DEPARTMENT_OTHER): Payer: Medicare Other

## 2016-06-13 ENCOUNTER — Telehealth: Payer: Self-pay | Admitting: Hematology

## 2016-06-13 VITALS — BP 109/60 | HR 69 | Temp 97.5°F | Resp 18 | Ht 66.0 in | Wt 146.6 lb

## 2016-06-13 DIAGNOSIS — I5020 Unspecified systolic (congestive) heart failure: Secondary | ICD-10-CM | POA: Diagnosis not present

## 2016-06-13 DIAGNOSIS — D472 Monoclonal gammopathy: Secondary | ICD-10-CM

## 2016-06-13 DIAGNOSIS — Z85820 Personal history of malignant melanoma of skin: Secondary | ICD-10-CM | POA: Diagnosis not present

## 2016-06-13 LAB — CBC & DIFF AND RETIC
BASO%: 0.3 % (ref 0.0–2.0)
Basophils Absolute: 0 10*3/uL (ref 0.0–0.1)
EOS%: 0.6 % (ref 0.0–7.0)
Eosinophils Absolute: 0.1 10*3/uL (ref 0.0–0.5)
HCT: 40.7 % (ref 34.8–46.6)
HGB: 13.7 g/dL (ref 11.6–15.9)
Immature Retic Fract: 3.1 % (ref 1.60–10.00)
LYMPH%: 22.1 % (ref 14.0–49.7)
MCH: 32.9 pg (ref 25.1–34.0)
MCHC: 33.7 g/dL (ref 31.5–36.0)
MCV: 97.6 fL (ref 79.5–101.0)
MONO#: 0.6 10*3/uL (ref 0.1–0.9)
MONO%: 5.8 % (ref 0.0–14.0)
NEUT#: 7.1 10*3/uL — ABNORMAL HIGH (ref 1.5–6.5)
NEUT%: 71.2 % (ref 38.4–76.8)
Platelets: 349 10*3/uL (ref 145–400)
RBC: 4.17 10*6/uL (ref 3.70–5.45)
RDW: 13.6 % (ref 11.2–14.5)
Retic %: 0.87 % (ref 0.70–2.10)
Retic Ct Abs: 36.28 10*3/uL (ref 33.70–90.70)
WBC: 9.9 10*3/uL (ref 3.9–10.3)
lymph#: 2.2 10*3/uL (ref 0.9–3.3)

## 2016-06-13 LAB — COMPREHENSIVE METABOLIC PANEL
ALT: 30 U/L (ref 0–55)
AST: 24 U/L (ref 5–34)
Albumin: 3.8 g/dL (ref 3.5–5.0)
Alkaline Phosphatase: 73 U/L (ref 40–150)
Anion Gap: 8 mEq/L (ref 3–11)
BUN: 18.4 mg/dL (ref 7.0–26.0)
CO2: 27 mEq/L (ref 22–29)
Calcium: 10.1 mg/dL (ref 8.4–10.4)
Chloride: 103 mEq/L (ref 98–109)
Creatinine: 0.7 mg/dL (ref 0.6–1.1)
EGFR: >90 mL/min/{1.73_m2} (ref >90)
Glucose: 72 mg/dl (ref 70–140)
Potassium: 4.4 mEq/L (ref 3.5–5.1)
Sodium: 138 mEq/L (ref 136–145)
Total Bilirubin: 0.29 mg/dL (ref 0.20–1.20)
Total Protein: 7.8 g/dL (ref 6.4–8.3)

## 2016-06-13 NOTE — Telephone Encounter (Signed)
Appointments scheduled per 06/13/16 los. A copy of the AVS report and appointment schedule was given to the patient per 06/13/16 los. °

## 2016-06-13 NOTE — Progress Notes (Signed)
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 Skeletal survey negative. Bone marrow deferred as per patient choice.  Current Treatment: observation   INTERVAL HISTORY:  [name]  Is here for her scheduled 6 month follow-up for IgG lambda MGUS. She reports no new bone pains. No new fatigue. No new anemia, hypercalcemia or change in kidney function. Her myeloma panel from June 2017 showed stable M spike. Myeloma panel from today is pending. Patient reports that she had multiple PVCs and bigeminy I during her lithotripsy procedure which needed to be canceled. She has been seen by Dr. Adrian Prows for cardiology evaluation and as per reports was newly diagnosed with a nonischemic cardiomyopathy with an ejection fraction of 33% and has been started on ACE inhibitor and beta blocker. She also has had issues with right foot drop and back pain and has been scheduled for right L3-L4 anterolateral lumbar interbody fusion with lateral plate by Dr. Vertell Limber on 06/15/2016 after cardiology clearance.  Notes no other acute new symptoms at this time. Has been feeling well overall.  REVIEW OF SYSTEMS:    10 Point review of systems of done and is negative except as noted above.  . Past Medical History:  Diagnosis Date  . Anemia   . Anxiety panic attacks   [name]  . Arthritis    knees  . Cardiomyopathy, nonischemic ([place]) 04/07/2016   EF left ventricule calculated on Lexiscan stress test at 33% with global hypokinesis.   . Chronic back pain   . Chronic neck pain   . Depression   . Dysrhythmia    pvc's with cardiomyopathy  . Family history of adverse reaction to anesthesia    mother has problems  waking up  . Fibromyalgia    and neuropathy of feet  . History of kidney stones    lithotripsy  03/2016  . Hyperlipidemia   . Hypertension   . Kidney stone    History  . Kidney stones h/o  . Melanoma ([place]) RUE   [name]  . MGUS (monoclonal gammopathy of unknown significance) 06/2015   no treatment just watching recheck blood work 12/2015  . Migraine    OTC med prn - stress related  . Plantar fasciitis right  . Seasonal allergies   . SVD (spontaneous vaginal delivery)    x 3  . Tendonitis of foot right    . Past Surgical History:  Procedure Laterality Date  . ABDOMINAL HYSTERECTOMY    . ABLATION ON ENDOMETRIOSIS N/A 11/24/2015   Procedure: ABLATION ON ENDOMETRIOSIS;  Surgeon: [name], MD;  Location: [facility];  Service: Gynecology;  Laterality: N/A;  . BACK SURGERY  Dr.Stern, last sx Summer 2011   X 4  (L3-S1)  . BLADDER SUSPENSION N/A 11/24/2015   Procedure: TRANSVAGINAL TAPE (TVT) PROCEDURE;  Surgeon: [name], MD;  Location: [facility];  Service: Gynecology;  Laterality: N/A;  . COLONOSCOPY    . CYSTOCELE REPAIR  11/24/2015   Procedure: ANTERIOR REPAIR (CYSTOCELE);  Surgeon: [name], MD;  Location: [facility];  Service: Gynecology;;  . Consuela Mimes N/A 11/24/2015   Procedure: CYSTOSCOPY;  Surgeon: [name], MD;  Location: [facility];  Service: Gynecology;  Laterality: N/A;  . DILATION AND CURETTAGE OF  UTERUS    . ENDOMETRIAL ABLATION  08/2000  . EYE SURGERY Bilateral    Lasik   . KIDNEY STONE SURGERY  2011  . MELANOMA EXCISION  05/2015   right forearm; [name]  . NECK SURGERY  [name]  Summer 2011   X 4  (C4-T1 Level)  . RHINOPLASTY    . ROBOTIC ASSISTED BILATERAL SALPINGO OOPHERECTOMY Bilateral 11/24/2015   Procedure: ROBOTIC ASSISTED BILATERAL SALPINGO OOPHORECTOMY;  Surgeon: [name], MD;  Location: [facility];  Service: Gynecology;  Laterality: Bilateral;  . ROBOTIC ASSISTED TOTAL HYSTERECTOMY WITH SALPINGECTOMY Bilateral 11/24/2015    Procedure: ROBOTIC ASSISTED TOTAL HYSTERECTOMY WITH SALPINGECTOMY;  Surgeon: [name], MD;  Location: [facility];  Service: Gynecology;  Laterality: Bilateral;  . TONSILLECTOMY    . WISDOM TOOTH EXTRACTION      . Social History  Substance Use Topics  . Smoking status: Former Smoker    Packs/day: 0.10    Types: Cigarettes    Quit date: 07/04/1987  . Smokeless tobacco: Never Used  . Alcohol use 0.0 oz/week     Comment: ONCE A MONTH Wine    ALLERGIES:  is allergic to penicillins; codeine; voltaren [diclofenac]; and erythromycin.  MEDICATIONS:  Current Outpatient Prescriptions  Medication Sig Dispense Refill  . buPROPion (WELLBUTRIN XL) 150 MG 24 hr tablet Take 150 mg by mouth daily.    . carvedilol (COREG) 25 MG tablet Take 6.25-12.5 mg by mouth See admin instructions. TAKES 6.25 MG IN THE MORNING AND 6.25 MG IN THE EVENING    . cetirizine (ZYRTEC) 10 MG tablet Take 10 mg by mouth daily.    . divalproex (DEPAKOTE ER) 500 MG 24 hr tablet TAKE 1 TABLET (500 MG TOTAL) BY MOUTH DAILY. 30 tablet 5  . FLUoxetine (PROZAC) 40 MG capsule Take 40 mg by mouth daily.     Lindsey Rodriguez lisinopril (PRINIVIL,ZESTRIL) 20 MG tablet Take 10 mg by mouth every evening.     Lindsey Rodriguez LORazepam (ATIVAN) 0.5 MG tablet Take 1-2 tablets (0.5-1 mg total) by mouth 3 (three) times daily as needed for anxiety. Prn anxiety 30 tablet 0  . mirabegron ER (MYRBETRIQ) 50 MG TB24 tablet Take 50 mg by mouth daily.    Lindsey Rodriguez MISC NATURAL PRODUCTS PO Take 2 capsules by mouth 2 (two) times daily. Rejuvenixx    . MISC NATURAL PRODUCTS PO Take 1 capsule by mouth 2 (two) times daily. Optimal- V    . MISC NATURAL PRODUCTS PO Take 1 capsule by mouth 2 (two) times daily. Optimal-M    . MISC NATURAL PRODUCTS PO Take 1 capsule by mouth 2 (two) times daily. MagnaCal-D    . pregabalin (LYRICA) 75 MG capsule Take 1 capsule (75 mg total) by mouth 2 (two) times daily. 180 capsule 1  . tiZANidine (ZANAFLEX) 2 MG tablet Take 2 mg by mouth every 6 (six) hours as  needed for muscle spasms.     No current facility-administered medications for this visit.     PHYSICAL EXAMINATION: ECOG PERFORMANCE STATUS: 2  . Vitals:   06/13/16 1324  BP: 109/60  Pulse: 69  Resp: 18  Temp: 97.5 F (36.4 C)    Filed Weights   06/13/16 1324  Weight: 146 lb 9.6 oz (66.5 kg)   .Body mass index is 23.66 kg/m.  GENERAL:alert, in no acute distress and comfortable SKIN: skin color, texture, turgor are normal, no rashes or significant lesions EYES: normal, conjunctiva are pink and non-injected, sclera clear OROPHARYNX:no exudate, no erythema and lips, buccal mucosa,  and tongue normal  NECK: supple, no JVD, thyroid normal size, non-tender, without nodularity LYMPH:  no palpable lymphadenopathy in the cervical, axillary or inguinal LUNGS: clear to auscultation with normal respiratory effort HEART: regular rate & rhythm,  no murmurs and no lower extremity edema ABDOMEN: abdomen soft, non-tender, normoactive bowel sounds  Musculoskeletal: no cyanosis of digits and no clubbing  PSYCH: alert & oriented x 3 with fluent speech NEURO: no focal motor/sensory deficits  LABORATORY DATA:   I have reviewed the data as listed  . CBC Latest Ref Rng & Units 06/13/2016 06/09/2016 02/23/2016  WBC 3.9 - 10.3 10e3/uL 9.9 9.6 6.3  Hemoglobin 11.6 - 15.9 g/dL 13.7 13.6 11.5(L)  Hematocrit 34.8 - 46.6 % 40.7 40.3 35.3  Platelets 145 - 400 10e3/uL 349 326 341    . CMP Latest Ref Rng & Units 06/13/2016 06/09/2016 02/23/2016  Glucose 70 - 140 mg/dl 72 82 66  BUN 7.0 - 26.0 mg/dL 18.4 10 -  Creatinine 0.6 - 1.1 mg/dL 0.7 0.68 -  Sodium 136 - 145 mEq/L 138 136 -  Potassium 3.5 - 5.1 mEq/L 4.4 4.3 -  Chloride 101 - 111 mmol/L - 102 -  CO2 22 - 29 mEq/L 27 27 -  Calcium 8.4 - 10.4 mg/dL 10.1 9.8 -  Total Protein 6.4 - 8.3 g/dL 7.8 7.2 -  Total Bilirubin 0.20 - 1.20 mg/dL 0.29 0.5 -  Alkaline Phos 40 - 150 U/L 73 54 -  AST 5 - 34 U/L 24 22 -  ALT 0 - 55 U/L 30 26 -           RADIOGRAPHIC STUDIES: I have personally reviewed the radiological images as listed and agreed with the findings in the report. Dg Chest 2 View  Result Date: 06/09/2016 CLINICAL DATA:  Preoperative chest examination. EXAM: CHEST  2 VIEW COMPARISON:  02/28/2011 FINDINGS: Both lungs are clear. Negative for airspace disease or pulmonary edema. Heart and mediastinum are within normal limits. Again noted are surgical plates in the cervical spine and cervicothoracic junction. No acute bone abnormality. No pleural effusions. IMPRESSION: No active cardiopulmonary disease. Electronically Signed   By: Markus Daft M.D.   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 Previous UPEP and skeletal survey negative. Patient has no evidence of anemia, renal failure on labs. No hypercalcemia. No new bone pain. previous SPEP showed stable M-spike at 0.8g/dl with no significant increase. SPEP done today shows stable M protein at 0.8g/dl which is unchanged from her last visit.  2) New systolic CHF noted during work of arrhythmias ?etiology. Following with Dr Adrian Prows- cardiology and on progressively optimized CHF medications. Plan - if there is any concern from a cardiology standpoint that AL amyloidosis could be a possible etiology for the systolic patient might need additional workup for this including consideration of a endomyocardial biopsy. If this is not possible but AL Amyloidosis is being considered based on cardiac presentation/lack of alternative explanation would consider BM Bx to check for amyloidosis. -no evidence of Multiple myeloma at this time.  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 Plan -Continue management per dermatology  and ongoing skin surveillance. -continue sun protection.  Continue followup with primary care physician, rheumatology, cardiology  RTC with Dr Irene Limbo in 54month with  cbc, cmp and SPEP unless Cardiology has concerns for cardiac AL amyloidosis as a cause for systolic CHF  I spent 15 minutes counseling the patient face to face. The total time spent in the appointment was 20 minutes and more than 50% was on counseling and direct patient cares.    Sullivan Lone MD [place] AAHIVMS [facility] Hematology/Oncology Physician [facility]  (Office):       [phone] (Work cell):  [phone] (Fax):           [phone]

## 2016-06-15 ENCOUNTER — Inpatient Hospital Stay (HOSPITAL_COMMUNITY): Payer: Medicare Other | Admitting: Emergency Medicine

## 2016-06-15 ENCOUNTER — Encounter (HOSPITAL_COMMUNITY)
Admission: RE | Disposition: A | Discharge status: Home / Self Care | Payer: Self-pay | Source: Ambulatory Visit | Attending: Neurosurgery

## 2016-06-15 ENCOUNTER — Inpatient Hospital Stay (HOSPITAL_COMMUNITY)
Admission: RE | Admit: 2016-06-15 | Discharge: 2016-06-16 | DRG: 460 | Disposition: A | Discharge status: Home / Self Care | Payer: Medicare Other | Source: Ambulatory Visit | Attending: Neurosurgery | Admitting: Neurosurgery

## 2016-06-15 ENCOUNTER — Inpatient Hospital Stay (HOSPITAL_COMMUNITY): Payer: Medicare Other

## 2016-06-15 ENCOUNTER — Inpatient Hospital Stay (HOSPITAL_COMMUNITY): Payer: Medicare Other | Admitting: Certified Registered Nurse Anesthetist

## 2016-06-15 ENCOUNTER — Encounter (HOSPITAL_COMMUNITY): Payer: Self-pay | Admitting: Surgery

## 2016-06-15 DIAGNOSIS — M48062 Spinal stenosis, lumbar region with neurogenic claudication: Secondary | ICD-10-CM | POA: Diagnosis present

## 2016-06-15 DIAGNOSIS — I429 Cardiomyopathy, unspecified: Secondary | ICD-10-CM | POA: Diagnosis present

## 2016-06-15 DIAGNOSIS — Z880 Allergy status to penicillin: Secondary | ICD-10-CM

## 2016-06-15 DIAGNOSIS — Z881 Allergy status to other antibiotic agents status: Secondary | ICD-10-CM | POA: Diagnosis not present

## 2016-06-15 DIAGNOSIS — Z885 Allergy status to narcotic agent status: Secondary | ICD-10-CM

## 2016-06-15 DIAGNOSIS — Z87442 Personal history of urinary calculi: Secondary | ICD-10-CM | POA: Diagnosis not present

## 2016-06-15 DIAGNOSIS — Z681 Body mass index (BMI) 19 or less, adult: Secondary | ICD-10-CM | POA: Diagnosis not present

## 2016-06-15 DIAGNOSIS — M5116 Intervertebral disc disorders with radiculopathy, lumbar region: Principal | ICD-10-CM | POA: Diagnosis present

## 2016-06-15 DIAGNOSIS — Z419 Encounter for procedure for purposes other than remedying health state, unspecified: Secondary | ICD-10-CM

## 2016-06-15 DIAGNOSIS — Z825 Family history of asthma and other chronic lower respiratory diseases: Secondary | ICD-10-CM | POA: Diagnosis not present

## 2016-06-15 DIAGNOSIS — I10 Essential (primary) hypertension: Secondary | ICD-10-CM | POA: Diagnosis present

## 2016-06-15 DIAGNOSIS — M1990 Unspecified osteoarthritis, unspecified site: Secondary | ICD-10-CM | POA: Diagnosis present

## 2016-06-15 DIAGNOSIS — M4126 Other idiopathic scoliosis, lumbar region: Secondary | ICD-10-CM | POA: Diagnosis present

## 2016-06-15 DIAGNOSIS — G589 Mononeuropathy, unspecified: Secondary | ICD-10-CM | POA: Diagnosis present

## 2016-06-15 DIAGNOSIS — M549 Dorsalgia, unspecified: Secondary | ICD-10-CM | POA: Diagnosis present

## 2016-06-15 DIAGNOSIS — M419 Scoliosis, unspecified: Secondary | ICD-10-CM | POA: Diagnosis present

## 2016-06-15 HISTORY — PX: ANTERIOR LAT LUMBAR FUSION: SHX1168

## 2016-06-15 SURGERY — ANTERIOR LATERAL LUMBAR FUSION 1 LEVEL
Anesthesia: General | Laterality: Right

## 2016-06-15 MED ORDER — THROMBIN 5000 UNITS EX SOLR
CUTANEOUS | Status: AC
  Filled 2016-06-15: qty 5000

## 2016-06-15 MED ORDER — KCL IN DEXTROSE-NACL 20-5-0.45 MEQ/L-%-% IV SOLN: INTRAVENOUS | Status: DC

## 2016-06-15 MED ORDER — ALUM & MAG HYDROXIDE-SIMETH 200-200-20 MG/5ML PO SUSP: 30.0000 mL | Freq: Four times a day (QID) | ORAL | Status: DC | PRN

## 2016-06-15 MED ORDER — LIDOCAINE 2% (20 MG/ML) 5 ML SYRINGE
INTRAMUSCULAR | Status: DC | PRN
  Administered 2016-06-15: 60 mg via INTRAVENOUS

## 2016-06-15 MED ORDER — FENTANYL CITRATE (PF) 100 MCG/2ML IJ SOLN
INTRAMUSCULAR | Status: DC | PRN
  Administered 2016-06-15 (×2): 50 ug via INTRAVENOUS
  Administered 2016-06-15: 100 ug via INTRAVENOUS

## 2016-06-15 MED ORDER — PHENYLEPHRINE HCL 10 MG/ML IJ SOLN
INTRAVENOUS | Status: DC | PRN
  Administered 2016-06-15: 20 ug/min via INTRAVENOUS

## 2016-06-15 MED ORDER — PHENYLEPHRINE 40 MCG/ML (10ML) SYRINGE FOR IV PUSH (FOR BLOOD PRESSURE SUPPORT)
PREFILLED_SYRINGE | INTRAVENOUS | Status: DC | PRN
  Administered 2016-06-15 (×2): 40 ug via INTRAVENOUS
  Administered 2016-06-15: 80 ug via INTRAVENOUS
  Administered 2016-06-15: 40 ug via INTRAVENOUS
  Administered 2016-06-15: 80 ug via INTRAVENOUS

## 2016-06-15 MED ORDER — LACTATED RINGERS IV SOLN
INTRAVENOUS | Status: DC | PRN
  Administered 2016-06-15 (×2): via INTRAVENOUS

## 2016-06-15 MED ORDER — TIZANIDINE HCL 4 MG PO TABS: 2.0000 mg | ORAL_TABLET | Freq: Four times a day (QID) | ORAL | Status: DC | PRN

## 2016-06-15 MED ORDER — EPHEDRINE 5 MG/ML INJ
INTRAVENOUS | Status: AC
  Filled 2016-06-15: qty 10

## 2016-06-15 MED ORDER — HYDROMORPHONE HCL 1 MG/ML IJ SOLN
0.2500 mg | INTRAMUSCULAR | Status: DC | PRN
  Administered 2016-06-15 (×3): 0.5 mg via INTRAVENOUS

## 2016-06-15 MED ORDER — FENTANYL CITRATE (PF) 100 MCG/2ML IJ SOLN
INTRAMUSCULAR | Status: AC
  Filled 2016-06-15: qty 4

## 2016-06-15 MED ORDER — DEXAMETHASONE SODIUM PHOSPHATE 10 MG/ML IJ SOLN
INTRAMUSCULAR | Status: DC | PRN
  Administered 2016-06-15: 10 mg via INTRAVENOUS

## 2016-06-15 MED ORDER — HYDROCODONE-ACETAMINOPHEN 5-325 MG PO TABS: 1.0000 | ORAL_TABLET | ORAL | Status: DC | PRN

## 2016-06-15 MED ORDER — MIDAZOLAM HCL 5 MG/5ML IJ SOLN
INTRAMUSCULAR | Status: DC | PRN
  Administered 2016-06-15: 2 mg via INTRAVENOUS

## 2016-06-15 MED ORDER — VANCOMYCIN HCL IN DEXTROSE 1-5 GM/200ML-% IV SOLN
INTRAVENOUS | Status: AC
  Administered 2016-06-15: 1000 mg via INTRAVENOUS
  Filled 2016-06-15: qty 200

## 2016-06-15 MED ORDER — PANTOPRAZOLE SODIUM 40 MG IV SOLR
40.0000 mg | Freq: Every day | INTRAVENOUS | Status: DC
  Administered 2016-06-15: 40 mg via INTRAVENOUS
  Filled 2016-06-15: qty 40

## 2016-06-15 MED ORDER — ACETAMINOPHEN 325 MG PO TABS: 650.0000 mg | ORAL_TABLET | ORAL | Status: DC | PRN

## 2016-06-15 MED ORDER — SODIUM CHLORIDE 0.9% FLUSH
3.0000 mL | Freq: Two times a day (BID) | INTRAVENOUS | Status: DC
  Administered 2016-06-15 – 2016-06-16 (×2): 3 mL via INTRAVENOUS

## 2016-06-15 MED ORDER — ONDANSETRON HCL 4 MG/2ML IJ SOLN
INTRAMUSCULAR | Status: AC
  Filled 2016-06-15: qty 2

## 2016-06-15 MED ORDER — CHLORHEXIDINE GLUCONATE CLOTH 2 % EX PADS: 6.0000 | MEDICATED_PAD | Freq: Once | CUTANEOUS | Status: DC

## 2016-06-15 MED ORDER — CARVEDILOL 6.25 MG PO TABS
6.2500 mg | ORAL_TABLET | Freq: Two times a day (BID) | ORAL | Status: DC
  Administered 2016-06-15 – 2016-06-16 (×2): 6.25 mg via ORAL
  Filled 2016-06-15 (×2): qty 1

## 2016-06-15 MED ORDER — BUPIVACAINE HCL (PF) 0.5 % IJ SOLN
INTRAMUSCULAR | Status: DC | PRN
Start: 2016-06-15 — End: 2016-06-15
  Administered 2016-06-15: 10 mL

## 2016-06-15 MED ORDER — DIVALPROEX SODIUM ER 500 MG PO TB24
500.0000 mg | ORAL_TABLET | Freq: Every day | ORAL | Status: DC
Start: 2016-06-15 — End: 2016-06-16
  Administered 2016-06-15: 500 mg via ORAL
  Filled 2016-06-15 (×2): qty 1

## 2016-06-15 MED ORDER — BISACODYL 10 MG RE SUPP: 10.0000 mg | Freq: Every day | RECTAL | Status: DC | PRN

## 2016-06-15 MED ORDER — THROMBIN 5000 UNITS EX SOLR
CUTANEOUS | Status: DC | PRN
  Administered 2016-06-15 (×2): 5000 [IU] via TOPICAL

## 2016-06-15 MED ORDER — HYDROMORPHONE HCL 1 MG/ML IJ SOLN
0.2500 mg | INTRAMUSCULAR | Status: DC | PRN
  Administered 2016-06-15 (×4): 0.5 mg via INTRAVENOUS

## 2016-06-15 MED ORDER — THROMBIN 5000 UNITS EX SOLR
CUTANEOUS | Status: AC
  Filled 2016-06-15: qty 10000

## 2016-06-15 MED ORDER — HYDROMORPHONE HCL 2 MG/ML IJ SOLN
INTRAMUSCULAR | Status: AC
  Filled 2016-06-15: qty 1

## 2016-06-15 MED ORDER — FLUOXETINE HCL 20 MG PO CAPS
40.0000 mg | ORAL_CAPSULE | Freq: Every day | ORAL | Status: DC
  Administered 2016-06-16: 40 mg via ORAL
  Filled 2016-06-15: qty 2

## 2016-06-15 MED ORDER — LACTATED RINGERS IV SOLN
INTRAVENOUS | Status: DC
  Administered 2016-06-15: 10:00:00 via INTRAVENOUS

## 2016-06-15 MED ORDER — OXYCODONE-ACETAMINOPHEN 5-325 MG PO TABS
1.0000 | ORAL_TABLET | ORAL | Status: DC | PRN
  Administered 2016-06-16: 2 via ORAL
  Filled 2016-06-15: qty 2

## 2016-06-15 MED ORDER — MENTHOL 3 MG MT LOZG: 1.0000 | LOZENGE | OROMUCOSAL | Status: DC | PRN

## 2016-06-15 MED ORDER — DEXAMETHASONE SODIUM PHOSPHATE 10 MG/ML IJ SOLN
INTRAMUSCULAR | Status: AC
  Filled 2016-06-15: qty 1

## 2016-06-15 MED ORDER — PREGABALIN 75 MG PO CAPS
75.0000 mg | ORAL_CAPSULE | Freq: Two times a day (BID) | ORAL | Status: DC
  Administered 2016-06-15 – 2016-06-16 (×2): 75 mg via ORAL
  Filled 2016-06-15 (×2): qty 1

## 2016-06-15 MED ORDER — LORAZEPAM 0.5 MG PO TABS
0.5000 mg | ORAL_TABLET | Freq: Three times a day (TID) | ORAL | Status: DC | PRN
  Administered 2016-06-15: 0.5 mg via ORAL
  Filled 2016-06-15: qty 1

## 2016-06-15 MED ORDER — SODIUM CHLORIDE 0.9 % IV SOLN: 250.0000 mL | INTRAVENOUS | Status: DC

## 2016-06-15 MED ORDER — LIDOCAINE-EPINEPHRINE (PF) 2 %-1:200000 IJ SOLN
INTRAMUSCULAR | Status: AC
  Filled 2016-06-15: qty 20

## 2016-06-15 MED ORDER — MIRABEGRON ER 50 MG PO TB24
50.0000 mg | ORAL_TABLET | Freq: Every day | ORAL | Status: DC
  Administered 2016-06-16: 50 mg via ORAL
  Filled 2016-06-15: qty 1

## 2016-06-15 MED ORDER — VANCOMYCIN HCL IN DEXTROSE 1-5 GM/200ML-% IV SOLN
1000.0000 mg | Freq: Once | INTRAVENOUS | Status: AC
  Administered 2016-06-15: 1000 mg via INTRAVENOUS
  Filled 2016-06-15: qty 200

## 2016-06-15 MED ORDER — BUPROPION HCL ER (XL) 150 MG PO TB24
150.0000 mg | ORAL_TABLET | Freq: Every day | ORAL | Status: DC
  Administered 2016-06-16: 150 mg via ORAL
  Filled 2016-06-15: qty 1

## 2016-06-15 MED ORDER — ONDANSETRON HCL 4 MG/2ML IJ SOLN
4.0000 mg | INTRAMUSCULAR | Status: DC | PRN
  Administered 2016-06-15 (×2): 4 mg via INTRAVENOUS
  Filled 2016-06-15 (×2): qty 2

## 2016-06-15 MED ORDER — SUCCINYLCHOLINE CHLORIDE 200 MG/10ML IV SOSY
PREFILLED_SYRINGE | INTRAVENOUS | Status: AC
  Filled 2016-06-15: qty 20

## 2016-06-15 MED ORDER — HYDROMORPHONE HCL 1 MG/ML IJ SOLN
0.5000 mg | INTRAMUSCULAR | Status: DC | PRN
  Administered 2016-06-15 – 2016-06-16 (×4): 1 mg via INTRAVENOUS
  Filled 2016-06-15 (×4): qty 1

## 2016-06-15 MED ORDER — LIDOCAINE-EPINEPHRINE 1 %-1:100000 IJ SOLN
INTRAMUSCULAR | Status: DC | PRN
  Administered 2016-06-15: 10 mL

## 2016-06-15 MED ORDER — EPHEDRINE SULFATE-NACL 50-0.9 MG/10ML-% IV SOSY
PREFILLED_SYRINGE | INTRAVENOUS | Status: DC | PRN
  Administered 2016-06-15: 10 mg via INTRAVENOUS

## 2016-06-15 MED ORDER — SODIUM CHLORIDE 0.9% FLUSH: 3.0000 mL | INTRAVENOUS | Status: DC | PRN

## 2016-06-15 MED ORDER — 0.9 % SODIUM CHLORIDE (POUR BTL) OPTIME
TOPICAL | Status: DC | PRN
  Administered 2016-06-15: 1000 mL

## 2016-06-15 MED ORDER — BUPIVACAINE HCL (PF) 0.5 % IJ SOLN
INTRAMUSCULAR | Status: AC
  Filled 2016-06-15: qty 30

## 2016-06-15 MED ORDER — SENNOSIDES-DOCUSATE SODIUM 8.6-50 MG PO TABS: 1.0000 | ORAL_TABLET | Freq: Every evening | ORAL | Status: DC | PRN

## 2016-06-15 MED ORDER — LISINOPRIL 20 MG PO TABS
10.0000 mg | ORAL_TABLET | Freq: Every evening | ORAL | Status: DC
  Administered 2016-06-15: 10 mg via ORAL
  Filled 2016-06-15: qty 1

## 2016-06-15 MED ORDER — PROPOFOL 10 MG/ML IV BOLUS
INTRAVENOUS | Status: AC
  Filled 2016-06-15: qty 20

## 2016-06-15 MED ORDER — ACETAMINOPHEN 650 MG RE SUPP
650.0000 mg | RECTAL | Status: DC | PRN
Start: 2016-06-15 — End: 2016-06-16

## 2016-06-15 MED ORDER — VANCOMYCIN HCL IN DEXTROSE 1-5 GM/200ML-% IV SOLN
1000.0000 mg | INTRAVENOUS | Status: AC
  Administered 2016-06-15: 1000 mg via INTRAVENOUS

## 2016-06-15 MED ORDER — METHOCARBAMOL 1000 MG/10ML IJ SOLN
500.0000 mg | Freq: Four times a day (QID) | INTRAVENOUS | Status: DC | PRN
  Filled 2016-06-15: qty 5

## 2016-06-15 MED ORDER — HEMOSTATIC AGENTS (NO CHARGE) OPTIME
TOPICAL | Status: DC | PRN
  Administered 2016-06-15: 1 via TOPICAL

## 2016-06-15 MED ORDER — PROPOFOL 10 MG/ML IV BOLUS
INTRAVENOUS | Status: DC | PRN
  Administered 2016-06-15: 80 mg via INTRAVENOUS
  Administered 2016-06-15: 20 mg via INTRAVENOUS
  Administered 2016-06-15: 40 mg via INTRAVENOUS
  Administered 2016-06-15 (×2): 30 mg via INTRAVENOUS

## 2016-06-15 MED ORDER — METHOCARBAMOL 500 MG PO TABS
500.0000 mg | ORAL_TABLET | Freq: Four times a day (QID) | ORAL | Status: DC | PRN
  Administered 2016-06-16: 500 mg via ORAL
  Filled 2016-06-15: qty 1

## 2016-06-15 MED ORDER — SUCCINYLCHOLINE CHLORIDE 200 MG/10ML IV SOSY
PREFILLED_SYRINGE | INTRAVENOUS | Status: DC | PRN
  Administered 2016-06-15: 100 mg via INTRAVENOUS

## 2016-06-15 MED ORDER — PHENYLEPHRINE 40 MCG/ML (10ML) SYRINGE FOR IV PUSH (FOR BLOOD PRESSURE SUPPORT)
PREFILLED_SYRINGE | INTRAVENOUS | Status: AC
  Filled 2016-06-15: qty 10

## 2016-06-15 MED ORDER — LIDOCAINE 2% (20 MG/ML) 5 ML SYRINGE
INTRAMUSCULAR | Status: AC
  Filled 2016-06-15: qty 5

## 2016-06-15 MED ORDER — LORATADINE 10 MG PO TABS
10.0000 mg | ORAL_TABLET | Freq: Every day | ORAL | Status: DC
  Administered 2016-06-15 – 2016-06-16 (×2): 10 mg via ORAL
  Filled 2016-06-15 (×2): qty 1

## 2016-06-15 MED ORDER — DOCUSATE SODIUM 100 MG PO CAPS
100.0000 mg | ORAL_CAPSULE | Freq: Two times a day (BID) | ORAL | Status: DC
  Administered 2016-06-15 – 2016-06-16 (×2): 100 mg via ORAL
  Filled 2016-06-15 (×2): qty 1

## 2016-06-15 MED ORDER — THROMBIN 5000 UNITS EX SOLR
OROMUCOSAL | Status: DC | PRN
  Administered 2016-06-15: 13:00:00 via TOPICAL

## 2016-06-15 MED ORDER — ZOLPIDEM TARTRATE 5 MG PO TABS: 5.0000 mg | ORAL_TABLET | Freq: Every evening | ORAL | Status: DC | PRN

## 2016-06-15 MED ORDER — PHENOL 1.4 % MT LIQD: 1.0000 | OROMUCOSAL | Status: DC | PRN

## 2016-06-15 MED ORDER — FLEET ENEMA 7-19 GM/118ML RE ENEM: 1.0000 | ENEMA | Freq: Once | RECTAL | Status: DC | PRN

## 2016-06-15 MED ORDER — ONDANSETRON HCL 4 MG/2ML IJ SOLN
INTRAMUSCULAR | Status: DC | PRN
  Administered 2016-06-15: 4 mg via INTRAVENOUS

## 2016-06-15 MED ORDER — MIDAZOLAM HCL 2 MG/2ML IJ SOLN
INTRAMUSCULAR | Status: AC
  Filled 2016-06-15: qty 2

## 2016-06-15 SURGICAL SUPPLY — 62 items
ADH SKN CLS APL DERMABOND .7 (GAUZE/BANDAGES/DRESSINGS) ×1
BLADE CLIPPER SURG (BLADE) IMPLANT
BOLT 5.0X45 SPINAL (Bolt) ×2 IMPLANT
BOLT DECADE 5.5X40 (Bolt) ×1 IMPLANT
BOLT SPNL LRG 45X5.5XPLAT NS (Screw) IMPLANT
CARTRIDGE OIL MAESTRO DRILL (MISCELLANEOUS) ×1 IMPLANT
COVER BACK TABLE 24X17X13 BIG (DRAPES) IMPLANT
DECANTER SPIKE VIAL GLASS SM (MISCELLANEOUS) ×2 IMPLANT
DERMABOND ADVANCED (GAUZE/BANDAGES/DRESSINGS) ×1
DERMABOND ADVANCED .7 DNX12 (GAUZE/BANDAGES/DRESSINGS) ×2 IMPLANT
DIFFUSER DRILL AIR PNEUMATIC (MISCELLANEOUS) ×1 IMPLANT
DRAPE C-ARM 42X72 X-RAY (DRAPES) ×2 IMPLANT
DRAPE C-ARMOR (DRAPES) ×2 IMPLANT
DRAPE LAPAROTOMY 100X72X124 (DRAPES) ×2 IMPLANT
DRAPE POUCH INSTRU U-SHP 10X18 (DRAPES) ×2 IMPLANT
DRSG OPSITE POSTOP 4X6 (GAUZE/BANDAGES/DRESSINGS) ×1 IMPLANT
DURAPREP 26ML APPLICATOR (WOUND CARE) ×2 IMPLANT
ELECT REM PT RETURN 9FT ADLT (ELECTROSURGICAL) ×2
ELECTRODE REM PT RTRN 9FT ADLT (ELECTROSURGICAL) ×1 IMPLANT
GAUZE SPONGE 4X4 16PLY XRAY LF (GAUZE/BANDAGES/DRESSINGS) IMPLANT
GLOVE BIO SURGEON STRL SZ8 (GLOVE) ×2 IMPLANT
GLOVE BIOGEL PI IND STRL 8 (GLOVE) ×1 IMPLANT
GLOVE BIOGEL PI IND STRL 8.5 (GLOVE) ×1 IMPLANT
GLOVE BIOGEL PI INDICATOR 8 (GLOVE) ×2
GLOVE BIOGEL PI INDICATOR 8.5 (GLOVE) ×1
GLOVE ECLIPSE 7.5 STRL STRAW (GLOVE) ×2 IMPLANT
GLOVE ECLIPSE 8.0 STRL XLNG CF (GLOVE) ×4 IMPLANT
GLOVE EXAM NITRILE LRG STRL (GLOVE) IMPLANT
GLOVE EXAM NITRILE XL STR (GLOVE) IMPLANT
GLOVE EXAM NITRILE XS STR PU (GLOVE) IMPLANT
GOWN STRL REUS W/ TWL LRG LVL3 (GOWN DISPOSABLE) IMPLANT
GOWN STRL REUS W/ TWL XL LVL3 (GOWN DISPOSABLE) ×2 IMPLANT
GOWN STRL REUS W/TWL 2XL LVL3 (GOWN DISPOSABLE) ×2 IMPLANT
GOWN STRL REUS W/TWL LRG LVL3 (GOWN DISPOSABLE)
GOWN STRL REUS W/TWL XL LVL3 (GOWN DISPOSABLE) ×6
KIT BASIN OR (CUSTOM PROCEDURE TRAY) ×2 IMPLANT
KIT DILATOR XLIF 5 (KITS) IMPLANT
KIT INFUSE XX SMALL 0.7CC (Orthopedic Implant) ×1 IMPLANT
KIT ROOM TURNOVER OR (KITS) ×2 IMPLANT
KIT SURGICAL ACCESS MAXCESS 4 (KITS) ×1 IMPLANT
KIT XLIF (KITS) ×1
MODULE NVM5 NEXT GEN EMG (NEEDLE) ×1 IMPLANT
MODULUS XLW 10X22X55MM 10 (Spine Construct) ×1 IMPLANT
NDL HYPO 25X1 1.5 SAFETY (NEEDLE) ×1 IMPLANT
NEEDLE HYPO 25X1 1.5 SAFETY (NEEDLE) ×2 IMPLANT
NS IRRIG 1000ML POUR BTL (IV SOLUTION) ×2 IMPLANT
OIL CARTRIDGE MAESTRO DRILL (MISCELLANEOUS)
PACK LAMINECTOMY NEURO (CUSTOM PROCEDURE TRAY) ×2 IMPLANT
PLATE DECADE 4HOLE SZ12 XLIF (Plate) ×1 IMPLANT
PUTTY BONE ATTRAX 5CC STRIP (Putty) ×1 IMPLANT
SCREW 45MM (Screw) ×2 IMPLANT
SPONGE LAP 4X18 X RAY DECT (DISPOSABLE) IMPLANT
SPONGE SURGIFOAM ABS GEL SZ50 (HEMOSTASIS) IMPLANT
STAPLER SKIN PROX WIDE 3.9 (STAPLE) ×2 IMPLANT
SUT VIC AB 1 CT1 18XBRD ANBCTR (SUTURE) ×1 IMPLANT
SUT VIC AB 1 CT1 8-18 (SUTURE) ×2
SUT VIC AB 2-0 CT1 18 (SUTURE) ×2 IMPLANT
SUT VIC AB 3-0 SH 8-18 (SUTURE) ×2 IMPLANT
TOWEL OR 17X24 6PK STRL BLUE (TOWEL DISPOSABLE) ×2 IMPLANT
TOWEL OR 17X26 10 PK STRL BLUE (TOWEL DISPOSABLE) ×2 IMPLANT
TRAY FOLEY W/METER SILVER 16FR (SET/KITS/TRAYS/PACK) ×2 IMPLANT
WATER STERILE IRR 1000ML POUR (IV SOLUTION) ×2 IMPLANT

## 2016-06-15 NOTE — Anesthesia Preprocedure Evaluation (Addendum)
Anesthesia Evaluation  Patient identified by MRN, date of birth, ID band Patient awake    Reviewed: Allergy & Precautions, H&P , NPO status , Patient's Chart, lab work & pertinent test results, reviewed documented beta blocker date and time   Airway Mallampati: I  TM Distance: >3 FB Neck ROM: Full    Dental no notable dental hx. (+) Teeth Intact, Dental Advisory Given   Pulmonary neg pulmonary ROS, former smoker,    Pulmonary exam normal breath sounds clear to auscultation       Cardiovascular hypertension, Pt. on medications and Pt. on home beta blockers + dysrhythmias  Rhythm:Regular Rate:Normal     Neuro/Psych  Headaches, Anxiety Depression negative psych ROS   GI/Hepatic negative GI ROS, Neg liver ROS,   Endo/Other  negative endocrine ROS  Renal/GU Renal disease  negative genitourinary   Musculoskeletal  (+) Arthritis , Osteoarthritis,  Fibromyalgia -  Abdominal   Peds  Hematology negative hematology ROS (+) anemia ,   Anesthesia Other Findings   Reproductive/Obstetrics negative OB ROS                            Anesthesia Physical Anesthesia Plan  ASA: III  Anesthesia Plan: General   Post-op Pain Management:    Induction: Intravenous  Airway Management Planned: Oral ETT  Additional Equipment:   Intra-op Plan:   Post-operative Plan: Extubation in OR  Informed Consent: I have reviewed the patients History and Physical, chart, labs and discussed the procedure including the risks, benefits and alternatives for the proposed anesthesia with the patient or authorized representative who has indicated his/her understanding and acceptance.   Dental advisory given  Plan Discussed with: CRNA  Anesthesia Plan Comments:        Anesthesia Quick Evaluation

## 2016-06-15 NOTE — Interval H&P Note (Signed)
History and Physical Interval Note:  06/15/2016 12:35 PM  Lindsey Rodriguez  has presented today for surgery, with the diagnosis of SPINAL STENOSIS, LUMBAR REGION WITH NEUROGENIC CLAUDICATION  The various methods of treatment have been discussed with the patient and family. After consideration of risks, benefits and other options for treatment, the patient has consented to  Procedure(s) with comments: RIGHT L3-4 ANTEROLATERAL LUMBAR INTERBODY FUSION WITH LATERAL PLATE (Right) - RIGHT L3-4 ANTEROLATERAL LUMBAR INTERBODY FUSION WITH LATERAL PLATE as a surgical intervention .  The patient's history has been reviewed, patient examined, no change in status, stable for surgery.  I have reviewed the patient's chart and labs.  Questions were answered to the patient's satisfaction.     [name]

## 2016-06-15 NOTE — Progress Notes (Signed)
Awake, alert, conversant.  MAEW with good strength, including right DF/EHL strength, which appears to be improved.

## 2016-06-15 NOTE — Brief Op Note (Signed)
06/15/2016  3:02 PM  PATIENT:  Lindsey Rodriguez  50 y.o. female  PRE-OPERATIVE DIAGNOSIS:  SPINAL STENOSIS, LUMBAR REGION WITH NEUROGENIC CLAUDICATION, scoliosis, lumbago, radiculopathy L 34 level  POST-OPERATIVE DIAGNOSIS:  SPINAL STENOSIS, LUMBAR REGION WITH NEUROGENIC CLAUDICATION, scoliosis, lumbago, radiculopathy L 34 level  PROCEDURE:  Procedure(s): RIGHT LUMBAR THREE-FOUR ANTEROLATERAL LUMBAR INTERBODY FUSION WITH LATERAL PLATE (Right)  SURGEON:  Surgeon(s) and Role:    * Erline Levine, MD - Primary    * Newman Pies, MD - Assisting  PHYSICIAN ASSISTANT:   ASSISTANTS: Poteat, RN   ANESTHESIA:   general  EBL:  Total I/O In: 1000 [I.V.:1000] Out: [id number] [Urine:1275; Blood:50]  BLOOD ADMINISTERED:none  DRAINS: none   LOCAL MEDICATIONS USED:  MARCAINE    and LIDOCAINE   SPECIMEN:  No Specimen  DISPOSITION OF SPECIMEN:  N/A  COUNTS:  YES  TOURNIQUET:  * No tourniquets in log *  DICTATION: DICTATION: DICTATION: Patient is a 50 year old with severe spondylosis stenosis and scoliosis of the lumbar spine with radiculopathy. It was elected to take her to surgery for anterolateral decompression and lateral plate fixation at the L 34 level.  She had previously undergone fusion L 4 - S 1 levels.  Procedure: Patient was brought to the operating room and placed in a left lateral decubitus position on the operative table and using orthogonally projected C-arm fluoroscopy the patient was placed so that the L 34  level was visualized in AP and lateral plane. The patient was then taped into position. The table was flexed so as to expose the L 34 level. Skin was marked along with a posterior finger dissection incision. Her flank was then prepped and draped in usual sterile fashion and incisions were made. Posterior finger dissection was made to enter the retroperitoneal space and then subsequently the probe was inserted into the psoas muscle from the right side initially at the L34  level. After mapping the neural elements were able to dock the probe per the midpoint of this vertebral level and without indications electrically of too close proximity to the neural tissues. Subsequently the self-retaining tractor was.after sequential dilators were utilized the shim was employed and the interspace was cleared of psoas muscle and then incised. A thorough discectomy was performed. Instruments were used to clear the interspace of disc material. After thorough discectomy was performed and this was performed using AP and lateral fluoroscopy a 10 lordotic by 55 x 22 mm titanium implant was packed with extra extra small BMP and Attrax bone graft extender. This was tamped into position using the slides and its position was confirmed on AP and lateral fluoroscopy. A Decade lateral plate (size 12) was affixed to the lateral spine with 5.0 x 45 mm screws posteriorly and 5.5 x 45 mm bolts anteriorly. They were positioned to avoid the previously placed hardware in L 4 vertebral body.  All screws were torque locked and positioning was confirmed with AP and lateral fluoroscopy. . Hemostasis was assured the wounds were irrigated interrupted Vicryl sutures.Sterile occlusive dressing was placed with Dermabond. The patient was then extubated in the operating room and taken to recovery in stable and satisfactory condition having tolerated his operation well. Counts were correct at the end of the case.  PLAN OF CARE: Admit to inpatient   PATIENT DISPOSITION:  PACU - hemodynamically stable.   Delay start of Pharmacological VTE agent (>24hrs) due to surgical blood loss or risk of bleeding: yes

## 2016-06-15 NOTE — H&P (Signed)
Patient ID:   [id number] Patient: Lindsey Rodriguez  Date of Birth: [birth year] Visit Type: Office Visit   Date: 05/31/2016 12:15 PM Provider: Marchia Meiers. Vertell Limber MD   This 50 year old female presents for back pain.  History of Present Illness: 1.  back pain    The patient returns to review her MRI.  MRI reveals significant spinal stenosis and retrolisthesis at L3-4, degeneration at L2-3  The patient continues to have significant right foot weakness.      Medical/Surgical/Interim History Reviewed, no change.  Last detailed document date:05/24/2016.   Family History: Reviewed, no changes.  Last detailed document: 05/24/2016.   Social History: Tobacco use reviewed. Reviewed, no changes. Last detailed document date: 05/24/2016.      MEDICATIONS(added, continued or stopped this visit): Started Medication Directions Instruction Stopped   biotin 5 mg capsule      bromelains 500 mg tablet      bupoprion 150 mg     05/31/2016 carvedilol 6.25 mg tablet take 1 tablet by oral route 2 times every day with food and 1 12.5 qhs     cephalexin 500 mg capsule take 1 capsule by oral route  every 6 hours     fluoxetine 40 mg capsule take 1 capsule by oral route  every day in the morning     folate  ORAL      grape seed extract 50 mg capsule      inositol 650 mg tablet      lorazepam 0.5 mg tablet take 1 tablet by oral route 2 times every day as needed     niacin 50 mg tablet take 1 tablet by oral route  every day     riboflavin (vitamin B2) 50 mg tablet      tizanidine 2 mg capsule take 2 capsule by oral route  every 6 hours     vit B complex-folic acid 0.4 mg tablet      vit D with Calcium  ORAL        ALLERGIES: Ingredient Reaction Medication Name Comment  ERYTHROMYCIN BASE Unknown    DICLOFENAC SODIUM  Voltaren   CODEINE Unknown    PENICILLINS Unknown        Vitals Date Temp F BP Pulse Ht In Wt Lb BMI BSA Pain Score  05/31/2016  110/71 65 66 147 23.73  7/10       IMPRESSION The patient's MRI reveals significant spinal stenosis and retrolisthesis at L3-4 that is causing nerve compression. This is likely the cause of her progressive right leg weakness and incomplete foot drop. Due to this weakness, I recommend an L3-4 XLIF with lateral plating for alleviation of her right leg symptoms. I think that indirect decompression should prove a sufficient treatment, but if not, she may also require open surgery.  Completed Orders (this encounter) Order Details Reason Side Interpretation Result Initial Treatment Date Region  Lifestyle education regarding diet Patient encouraged to eat a well balanced diet.         Assessment/Plan # Detail Type Description   1. Assessment Body mass index (BMI) 19.9 or less, adult (Z68.1).   Plan Orders Today's instructions / counseling include(s) Lifestyle education regarding diet.         Pain Assessment/Treatment Pain Scale: 7/10. Method: Numeric Pain Intensity Scale. Location: back. Onset: 01/22/2016. Duration: varies. Quality: discomforting. Pain Assessment/Treatment follow-up plan of care: Patient taking medication as prescribed..  Fall Risk Plan The patient has not fallen in the last year.  Schedule  L3-4 XLIF and lateral plate. Nurse education given. Fitted for LSO brace.   Orders: Instruction(s)/Education: Assessment Instruction  Z68.1 Lifestyle education regarding diet    MEDICATIONS PRESCRIBED TODAY    Rx Quantity Refills  CARVEDILOL 6.25 mg  0 0            Provider:  Marchia Meiers. Vertell Limber MD  05/31/2016 12:29 PM Dictation edited by: Johnella Moloney    CC Providers: [facility] [address] [place],  [place]  [id number]-   [facility] [place] [address] [place], [place] [address]-              Electronically signed by Marchia Meiers Vertell Limber MD on 05/31/2016 01:25 PM  Patient ID:    [id number] Patient: Lindsey Rodriguez  Date of Birth: October 19, 1965 Visit Type: Office Visit   Date: 05/24/2016 08:30 AM Provider: Marchia Meiers. Vertell Limber MD   This 50 year old female presents for back pain.  History of Present Illness: 1.  back pain    Lindsey Rodriguez, 50 year old retired/disabled female returns for evaluation of right leg pain and right foot drop.  Last seen in 2013, she is undergone lumbar fusion in 2011 by Dr. Vertell Limber and cervical fusion in 2009 and 2010 by Dr. Vertell Limber.  She reports recurrent lumbar and right leg pain in March 2017, noting no injury.  ESI's provided some short-term pain relief.  Approximately six weeks ago she noticed weakness in her right ankle and right foot.  A third ESI offered no relief.  ESI 3 2017  Tizanidine 2 mg taken only as needed  History: HTN, angina, murmur, kidney stones, cardiomyopathy & PVC's diagnosed in June of this year  Surgical history: Wisdom teeth 1983, lumbar surgery 1986, tonsillectomy rhinoplasty 1987, lumbar surgeries 1991 and 1993, posterior cervical surgery 1997, Lasix bilateral 2000, endometrial ablation 2002, lithotripsies 2008 and 2017, ACDF 2009 and 2010, lumbar fusion 2011, melanoma right forearm 2016, hysterectomy 11-2015  Lumbar MRI March 2017 and abdominal CT June 2017 CANOPY, X-RAYS ON CANOPY  Prior MRI shows severe spinal stenosis at L 34 level with solid arthrodesis L 45 and L 5 S 1 levles.  Plain X rays show dextroconvex scoliosis of the low lumbar spine and left sided scoliosis at the L 34 level.        PAST MEDICAL/SURGICAL HISTORY   (Detailed)  Disease/disorder Onset Date Management Date Comments    Hysterectomy, vaginal  [facility] 05/24/2016 -    Tonsillectomy        [facility] 05/24/2016 -  Anxiety      Arthritis    [facility] 05/24/2016 -  Depression      Nosebleed    [facility] 05/24/2016 -  Urate kidney stones    [facility] 05/24/2016 -     PAST MEDICAL HISTORY, SURGICAL HISTORY, FAMILY HISTORY, SOCIAL HISTORY AND REVIEW OF  SYSTEMS I have reviewed the patient's past medical, surgical, family and social history as well as the comprehensive review of systems as included on the [facility] history form dated 05/24/2016, which I have signed.  Family History  (Detailed) Relationship Family Member Name Deceased Age at Death Condition Onset Age Cause of Death  Father    COPD  N  Mother    Arthritis  N    SOCIAL HISTORY  (Detailed) Tobacco use reviewed. Preferred language is Unknown.   Smoking status: Never smoker.  SMOKING STATUS Use Status Type Smoking Status  Usage Per Day Years Used Total Pack Years  no/never  Never smoker             MEDICATIONS(added, continued or stopped this visit): Started Medication Directions Instruction Stopped   biotin 5 mg capsule      bromelains 500 mg tablet      bupoprion 150 mg      cephalexin 500 mg capsule take 1 capsule by oral route  every 6 hours     fluoxetine 40 mg capsule take 1 capsule by oral route  every day in the morning     folate  ORAL      grape seed extract 50 mg capsule      inositol 650 mg tablet      lorazepam 0.5 mg tablet take 1 tablet by oral route 2 times every day as needed     niacin 50 mg tablet take 1 tablet by oral route  every day     riboflavin (vitamin B2) 50 mg tablet      tizanidine 2 mg capsule take 2 capsule by oral route  every 6 hours     vit B complex-folic acid 0.4 mg tablet      vit D with Calcium  ORAL        ALLERGIES: Ingredient Reaction Medication Name Comment  ERYTHROMYCIN BASE Unknown    DICLOFENAC SODIUM  Voltaren   CODEINE Unknown    PENICILLINS Unknown     Reviewed, updated.    Vitals Date Temp F BP Pulse Ht In Wt Lb BMI BSA Pain Score  05/24/2016  89/56 66 66 148 23.89  5/10     PHYSICAL EXAM General Level of Distress: no acute distress Overall Appearance: normal  Head and Face  Right Left  Fundoscopic Exam:  normal normal    Cardiovascular Cardiac: regular  rate and rhythm without murmur  Right Left  Carotid Pulses: normal normal  Respiratory Lungs: clear to auscultation  Neurological Orientation: normal Recent and Remote Memory: normal Attention Span and Concentration:   normal Language: normal Fund of Knowledge: normal  Right Left Sensation: normal normal Upper Extremity Coordination: normal normal  Lower Extremity Coordination: normal normal  Musculoskeletal Gait and Station: normal  Right Left Upper Extremity Muscle Strength: normal normal Lower Extremity Muscle Strength: normal normal Upper Extremity Muscle Tone:  normal normal Lower Extremity Muscle Tone: normal normal  Motor Strength Upper and lower extremity motor strength was tested in the clinically pertinent muscles. Any abnormal findings will be noted below.   Right Left Tib Anterior: 4/5  EHL: 4-/5    Deep Tendon Reflexes  Right Left Biceps: normal normal Triceps: normal normal Brachioradialis: normal normal Patellar: normal normal Achilles: normal normal  Sensory Sensation was tested at L1 to S1.   Cranial Nerves II. Optic Nerve/Visual Fields: normal III. Oculomotor: normal IV. Trochlear: normal V. Trigeminal: normal VI. Abducens: normal VII. Facial: normal VIII. Acoustic/Vestibular: normal IX. Glossopharyngeal: normal X. Vagus: normal XI. Spinal Accessory: normal XII. Hypoglossal: normal  Motor and other Tests Lhermittes: negative Rhomberg: negative Pronator drift: absent     Right Left Hoffman's: normal normal Clonus: normal normal Babinski: normal normal SLR: positive at 30 degrees negative Patrick's Corky Sox): negative negative Toe Walk: normal normal Toe Lift: normal normal Heel Walk: normal normal SI Joint: nontender nontender   Additional Findings:  Right hip abductor 4/5.  Numb both feet.    IMPRESSION Patient has progressive weakness of right leg with incomplete footdrop on the right.  Completed Orders (  this  encounter) Order Details Reason Side Interpretation Result Initial Treatment Date Region  Lumbar Spine- AP/Lat/Flex/Ex      05/24/2016 All Levels to All Levels  Giving encouragement to exercise Encourage patient to eat a well balanced diet and follow up with primary physcian.         Assessment/Plan # Detail Type Description   1. Assessment DDD (degenerative disc disease), lumbar (M51.36).       2. 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       3. Assessment Low back pain, unspecified back pain laterality, with sciatica presence unspecified (M54.5).       4. 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       5. Assessment Disc displacement, lumbar (M51.26).       6. Assessment Lumbar radiculopathy (M54.16).       7. Assessment Body mass index (BMI) 19.9 or less, adult (Z68.1).   Plan Orders Today's instructions / counseling include(s) Giving encouragement to exercise.         Pain Assessment/Treatment Pain Scale: 5/10. Method: Numeric Pain Intensity Scale. Location: back. Onset: 01/22/2016. Duration: varies. Quality: discomforting. Pain Assessment/Treatment follow-up plan of care: Pateint taking medication as prescribed..  Urgent repeat lumbar imaging with MRI and followup.  Orders: Diagnostic Procedures: Assessment Procedure  M48.062 MRI Spine/lumb With & W/o Contrast  M54.16 Lumbar Spine- AP/Lat/Flex/Ex  Instruction(s)/Education: Assessment Instruction  Z68.1 Giving encouragement to exercise             Provider:  Marchia Meiers. Vertell Limber MD  05/28/2016 04:44 PM Dictation edited by: Marchia Meiers. Vertell Limber    CC Providers: [facility] [place],  [place]  [id number]-   [facility] [place] [address], [place] [address]-              Electronically signed by Marchia Meiers Vertell Limber MD on 05/28/2016 04:44 PM

## 2016-06-15 NOTE — Progress Notes (Signed)
Pharmacy Antibiotic Note  Lindsey Rodriguez is a 50 y.o. female admitted on 06/15/2016 for spinal surgery. Pharmacy has been consulted for vancomycin dosing for surgical prophylaxis post-op.  Confirmed with RN that patient does not have a drain.  Baseline labs reviewed.   Plan: - Vanc 1g IV x 1 at 2200 (12 hours from previous dose) - Pharmacy will sign off.  Thank you for the consult!   Weight: 146 lb (66.2 kg)  Temp (24hrs), Avg:97.8 F (36.6 C), Min:97.5 F (36.4 C), Max:98 F (36.7 C)   Recent Labs Lab 06/09/16 1041 06/13/16 1308  WBC 9.6 9.9  CREATININE 0.68 0.7    Estimated Creatinine Clearance: 78.8 mL/min (by C-G formula based on SCr of 0.7 mg/dL).    Allergies  Allergen Reactions  . Penicillins Shortness Of Breath and Rash    Has patient had a PCN reaction causing immediate rash, facial/tongue/throat swelling, SOB or lightheadedness with hypotension: no Has patient had a PCN reaction causing severe rash involving mucus membranes or skin necrosis: no Has patient had a PCN reaction that required hospitalization no Has patient had a PCN reaction occurring within the last 10 years: no If all of the above answers are "NO", then may proceed with Cephalosporin use.   . Codeine Other (See Comments)    Manic depressive  . Voltaren [Diclofenac] Nausea And Vomiting and Other (See Comments)    Pt stated "tore stomach up"  . Erythromycin Rash     Lindsey Rodriguez D. Mina Marble, PharmD, BCPS Pager:  [phone] 06/15/2016, 5:33 PM

## 2016-06-15 NOTE — Transfer of Care (Signed)
Immediate Anesthesia Transfer of Care Note  Patient: Lindsey Rodriguez  Procedure(s) Performed: Procedure(s): RIGHT LUMBAR THREE-FOUR ANTEROLATERAL LUMBAR INTERBODY FUSION WITH LATERAL PLATE (Right)  Patient Location: PACU  Anesthesia Type:General  Level of Consciousness: awake, alert , oriented and patient cooperative  Airway & Oxygen Therapy: Patient Spontanous Breathing and Patient connected to nasal cannula oxygen  Post-op Assessment: Report given to RN, Post -op Vital signs reviewed and stable and Patient moving all extremities  Post vital signs: Reviewed and stable  Last Vitals:  Vitals:   06/15/16 0904  BP: 121/78  Pulse: (!) 58  Resp: 18  Temp: 36.6 C    Last Pain:  Vitals:   06/15/16 0929  TempSrc:   PainSc: 3       Patients Stated Pain Goal: 6 ([id number])  Complications: No apparent anesthesia complications

## 2016-06-15 NOTE — Anesthesia Procedure Notes (Signed)
Procedure Name: Intubation Date/Time: 06/15/2016 1:01 PM Performed by: Garrison Columbus T Pre-anesthesia Checklist: Patient identified, Emergency Drugs available, Suction available and Patient being monitored Patient Re-evaluated:Patient Re-evaluated prior to inductionOxygen Delivery Method: Circle System Utilized Preoxygenation: Pre-oxygenation with 100% oxygen Intubation Type: IV induction Ventilation: Mask ventilation without difficulty Laryngoscope Size: Miller and 2 Grade View: Grade I Tube type: Oral Tube size: 7.5 mm Number of attempts: 1 Airway Equipment and Method: Stylet and Oral airway Placement Confirmation: ETT inserted through vocal cords under direct vision,  positive ETCO2 and breath sounds checked- equal and bilateral Secured at: 22 cm Tube secured with: Tape Dental Injury: Teeth and Oropharynx as per pre-operative assessment

## 2016-06-15 NOTE — Op Note (Signed)
06/15/2016  3:02 PM  PATIENT:  Lindsey Rodriguez  50 y.o. female  PRE-OPERATIVE DIAGNOSIS:  SPINAL STENOSIS, LUMBAR REGION WITH NEUROGENIC CLAUDICATION, scoliosis, lumbago, radiculopathy L 34 level  POST-OPERATIVE DIAGNOSIS:  SPINAL STENOSIS, LUMBAR REGION WITH NEUROGENIC CLAUDICATION, scoliosis, lumbago, radiculopathy L 34 level  PROCEDURE:  Procedure(s): RIGHT LUMBAR THREE-FOUR ANTEROLATERAL LUMBAR INTERBODY FUSION WITH LATERAL PLATE (Right)  SURGEON:  Surgeon(s) and Role:    * Erline Levine, MD - Primary    * Newman Pies, MD - Assisting  PHYSICIAN ASSISTANT:   ASSISTANTS: Poteat, RN   ANESTHESIA:   general  EBL:  Total I/O In: 1000 [I.V.:1000] Out: [id number] [Urine:1275; Blood:50]  BLOOD ADMINISTERED:none  DRAINS: none   LOCAL MEDICATIONS USED:  MARCAINE    and LIDOCAINE   SPECIMEN:  No Specimen  DISPOSITION OF SPECIMEN:  N/A  COUNTS:  YES  TOURNIQUET:  * No tourniquets in log *  DICTATION: DICTATION: DICTATION: Patient is a 50 year old with severe spondylosis stenosis and scoliosis of the lumbar spine with radiculopathy. It was elected to take her to surgery for anterolateral decompression and lateral plate fixation at the L 34 level.  She had previously undergone fusion L 4 - S 1 levels.  Procedure: Patient was brought to the operating room and placed in a left lateral decubitus position on the operative table and using orthogonally projected C-arm fluoroscopy the patient was placed so that the L 34  level was visualized in AP and lateral plane. The patient was then taped into position. The table was flexed so as to expose the L 34 level. Skin was marked along with a posterior finger dissection incision. Her flank was then prepped and draped in usual sterile fashion and incisions were made. Posterior finger dissection was made to enter the retroperitoneal space and then subsequently the probe was inserted into the psoas muscle from the right side initially at the L34  level. After mapping the neural elements were able to dock the probe per the midpoint of this vertebral level and without indications electrically of too close proximity to the neural tissues. Subsequently the self-retaining tractor was.after sequential dilators were utilized the shim was employed and the interspace was cleared of psoas muscle and then incised. A thorough discectomy was performed. Instruments were used to clear the interspace of disc material. After thorough discectomy was performed and this was performed using AP and lateral fluoroscopy a 10 lordotic by 55 x 22 mm titanium implant was packed with extra extra small BMP and Attrax bone graft extender. This was tamped into position using the slides and its position was confirmed on AP and lateral fluoroscopy. A Decade lateral plate (size 12) was affixed to the lateral spine with 5.0 x 45 mm screws posteriorly and 5.5 x 45 mm bolts anteriorly. They were positioned to avoid the previously placed hardware in L 4 vertebral body.  All screws were torque locked and positioning was confirmed with AP and lateral fluoroscopy. . Hemostasis was assured the wounds were irrigated interrupted Vicryl sutures.Sterile occlusive dressing was placed with Dermabond. The patient was then extubated in the operating room and taken to recovery in stable and satisfactory condition having tolerated his operation well. Counts were correct at the end of the case.  PLAN OF CARE: Admit to inpatient   PATIENT DISPOSITION:  PACU - hemodynamically stable.   Delay start of Pharmacological VTE agent (>24hrs) due to surgical blood loss or risk of bleeding: yes

## 2016-06-16 ENCOUNTER — Encounter (HOSPITAL_COMMUNITY): Payer: Self-pay | Admitting: Neurosurgery

## 2016-06-16 LAB — MULTIPLE MYELOMA PANEL, SERUM
Albumin SerPl Elph-Mcnc: 3.9 g/dL (ref 2.9–4.4)
Albumin/Glob SerPl: 1.2 (ref 0.7–1.7)
Alpha 1: 0.2 g/dL (ref 0.0–0.4)
Alpha2 Glob SerPl Elph-Mcnc: 0.8 g/dL (ref 0.4–1.0)
B-Globulin SerPl Elph-Mcnc: 1.1 g/dL (ref 0.7–1.3)
Gamma Glob SerPl Elph-Mcnc: 1.3 g/dL (ref 0.4–1.8)
Globulin, Total: 3.3 g/dL (ref 2.2–3.9)
IgA, Qn, Serum: 109 mg/dL (ref 87–352)
IgG, Qn, Serum: 1188 mg/dL (ref 700–1600)
IgM, Qn, Serum: 73 mg/dL (ref 26–217)
M Protein SerPl Elph-Mcnc: 0.8 g/dL — ABNORMAL HIGH
Total Protein: 7.2 g/dL (ref 6.0–8.5)

## 2016-06-16 MED ORDER — DIVALPROEX SODIUM ER 500 MG PO TB24: 500.0000 mg | ORAL_TABLET | Freq: Every day | ORAL | Status: DC

## 2016-06-16 NOTE — Progress Notes (Addendum)
Subjective: Patient reports "I'm sore, but I'm ok. I walked down the hall."  Objective: Vital signs in last 24 hours: Temp:  [97.5 F (36.4 C)-98.3 F (36.8 C)] 97.6 F (36.4 C) (12/15 0758) Pulse Rate:  [64-71] 67 (12/15 0758) Resp:  [16-18] 18 (12/15 0758) BP: (105-151)/(69-87) 105/70 (12/15 0758) SpO2:  [97 %-100 %] 100 % (12/15 0758)  Intake/Output from previous day: 12/14 0701 - 12/15 0700 In: 1300 [I.V.:1300] Out: 2425 [Urine:2375; Blood:50] Intake/Output this shift: Total I/O In: 360 [P.O.:360] Out: -   Alert, conversant. Husband present. Incisions without erythema, swelling, or drainage beneath honeycomb drsgs and Dermabond. Good strength BLE. Reports some right hip & thigh pain, similar to pre-op, but minimal back pain.   Lab Results:  Recent Labs  06/13/16 1308  WBC 9.9  HGB 13.7  HCT 40.7  PLT 349   BMET  Recent Labs  06/13/16 1308  NA 138  K 4.4  CO2 27  GLUCOSE 72  BUN 18.4  CREATININE 0.7  CALCIUM 10.1    Studies/Results: Dg Lumbar Spine Complete  Result Date: 06/15/2016 CLINICAL DATA:  Lumbar spine surgery. EXAM: LUMBAR SPINE - COMPLETE 4+ VIEW; DG C-ARM 61-120 MIN COMPARISON:  Lumbar spine MRI 05/31/2016 FLUOROSCOPY TIME:  C-arm fluoroscopic images were obtained intraoperatively and submitted for post operative interpretation. Please see the performing provider's procedural report for the fluoroscopy time utilized. FINDINGS: Four intraoperative spot fluoroscopic images of the lumbar spine are provided. The first image demonstrates pre-existing posterior and interbody fusion from L4-S1. The 3 subsequent images demonstrate interval performance of anterolateral fusion at L3-4 with placement of an interbody fusion device and right lateral plate. IMPRESSION: Intraoperative images during L3-4 fusion. Electronically Signed   By: Logan Bores M.D.   On: 06/15/2016 14:54   Dg C-arm 61-120 Min  Result Date: 06/15/2016 CLINICAL DATA:  Lumbar spine  surgery. EXAM: LUMBAR SPINE - COMPLETE 4+ VIEW; DG C-ARM 61-120 MIN COMPARISON:  Lumbar spine MRI 05/31/2016 FLUOROSCOPY TIME:  C-arm fluoroscopic images were obtained intraoperatively and submitted for post operative interpretation. Please see the performing provider's procedural report for the fluoroscopy time utilized. FINDINGS: Four intraoperative spot fluoroscopic images of the lumbar spine are provided. The first image demonstrates pre-existing posterior and interbody fusion from L4-S1. The 3 subsequent images demonstrate interval performance of anterolateral fusion at L3-4 with placement of an interbody fusion device and right lateral plate. IMPRESSION: Intraoperative images during L3-4 fusion. Electronically Signed   By: Logan Bores M.D.   On: 06/15/2016 14:54    Assessment/Plan: Improving  LOS: 1 day  Ok to d/c to home per [name]. She already has 4 week office f/u scheduled. She verbalizes understanding of d/c instructions. Percocet & Robaxin will be eRx'ed for prn home use.    Verdis Prime 06/16/2016, 10:45 AM  Patient doing well.  Discharge home.

## 2016-06-16 NOTE — Discharge Instructions (Signed)

## 2016-06-16 NOTE — Discharge Summary (Signed)
Physician Discharge Summary  Patient ID: KINZIE Rodriguez MRN: [id number] DOB/AGE: May 15, 1966 50 y.o.  Admit date: 06/15/2016 Discharge date: 06/16/2016  Admission Diagnoses: SPINAL STENOSIS, LUMBAR REGION WITH NEUROGENIC CLAUDICATION, scoliosis, lumbago, radiculopathy L 34 level    Discharge Diagnoses: SPINAL STENOSIS, LUMBAR REGION WITH NEUROGENIC CLAUDICATION, scoliosis, lumbago, radiculopathy L 34 level s/p RIGHT LUMBAR THREE-FOUR ANTEROLATERAL LUMBAR INTERBODY FUSION WITH LATERAL PLATE (Right)     Active Problems:   Lumbar scoliosis   Discharged Condition: good  Hospital Course: Lindsey Rodriguez was admitted for surgery with dx spinal stenosis and radiculopathy. Following uncomplicated [id number] fusion, she has mobilized well.   Consults: None  Significant Diagnostic Studies: radiology: X-Ray: intra-op  Treatments: surgery: RIGHT LUMBAR THREE-FOUR ANTEROLATERAL LUMBAR INTERBODY FUSION WITH LATERAL PLATE (Right)    Discharge Exam: Blood pressure 105/70, pulse 67, temperature 97.6 F (36.4 C), temperature source Oral, resp. rate 18, weight 66.2 kg (146 lb), last menstrual period 12/16/2012, SpO2 100 %. Alert, conversant. Husband present. Incisions without erythema, swelling, or drainage beneath honeycomb drsgs and Dermabond. Good strength BLE. Reports some right hip &thigh pain, similar to pre-op, but minimal back pain. We discussed expected insult to the psoas muscle which will heal over the next few weeks.    Disposition: 01-Home or Self Care She already has 4 week office f/u scheduled. She verbalizes understanding of d/c instructions. Percocet &Robaxin will be eRx'ed for prn home use.          Allergies as of 06/16/2016      Reactions   Penicillins Shortness Of Breath, Rash   Has patient had a PCN reaction causing immediate rash, facial/tongue/throat swelling, SOB or lightheadedness with hypotension: no Has patient had a PCN reaction  causing severe rash involving mucus membranes or skin necrosis: no Has patient had a PCN reaction that required hospitalization no Has patient had a PCN reaction occurring within the last 10 years: no If all of the above answers are "NO", then may proceed with Cephalosporin use.   Codeine Other (See Comments)   Manic depressive   Voltaren [diclofenac] Nausea And Vomiting, Other (See Comments)   Pt stated "tore stomach up"   Erythromycin Rash      Allergies as of 06/16/2016      Reactions   Penicillins Shortness Of Breath, Rash   Has patient had a PCN reaction causing immediate rash, facial/tongue/throat swelling, SOB or lightheadedness with hypotension: no Has patient had a PCN reaction causing severe rash involving mucus membranes or skin necrosis: no Has patient had a PCN reaction that required hospitalization no Has patient had a PCN reaction occurring within the last 10 years: no If all of the above answers are "NO", then may proceed with Cephalosporin use.   Codeine Other (See Comments)   Manic depressive   Voltaren [diclofenac] Nausea And Vomiting, Other (See Comments)   Pt stated "tore stomach up"   Erythromycin Rash      Medication List    TAKE these medications   buPROPion 150 MG 24 hr tablet Commonly known as:  WELLBUTRIN XL Take 150 mg by mouth daily.   carvedilol 25 MG tablet Commonly known as:  COREG Take 6.25-12.5 mg by mouth See admin instructions. TAKES 6.25 MG IN THE MORNING AND 6.25 MG IN THE EVENING   cetirizine 10 MG tablet Commonly known as:  ZYRTEC Take 10 mg by mouth daily.   divalproex 500 MG 24 hr tablet Commonly known as:  DEPAKOTE ER TAKE 1 TABLET (500 MG TOTAL) BY MOUTH  DAILY.   FLUoxetine 40 MG capsule Commonly known as:  PROZAC Take 40 mg by mouth daily.   lisinopril 20 MG tablet Commonly known as:  PRINIVIL,ZESTRIL Take 10 mg by mouth every evening.   LORazepam 0.5 MG tablet Commonly known as:  ATIVAN Take 1-2 tablets (0.5-1  mg total) by mouth 3 (three) times daily as needed for anxiety. Prn anxiety   MISC NATURAL PRODUCTS PO Take 2 capsules by mouth 2 (two) times daily. Rejuvenixx   MISC NATURAL PRODUCTS PO Take 1 capsule by mouth 2 (two) times daily. Optimal- V   MISC NATURAL PRODUCTS PO Take 1 capsule by mouth 2 (two) times daily. Optimal-M   MISC NATURAL PRODUCTS PO Take 1 capsule by mouth 2 (two) times daily. MagnaCal-D   MYRBETRIQ 50 MG Tb24 tablet Generic drug:  mirabegron ER Take 50 mg by mouth daily.   pregabalin 75 MG capsule Commonly known as:  LYRICA Take 1 capsule (75 mg total) by mouth 2 (two) times daily.   tiZANidine 2 MG tablet Commonly known as:  ZANAFLEX Take 2 mg by mouth every 6 (six) hours as needed for muscle spasms.        Signed: Peggyann Shoals, MD 06/16/2016, 12:18 PM

## 2016-06-16 NOTE — Evaluation (Addendum)
Occupational Therapy Evaluation Patient Details Name: Lindsey Rodriguez MRN: [id number] DOB: Jan 23, 1966 Today's Date: 06/16/2016    History of Present Illness Pt is a 50 y/o female s/p L3-4 R anterior lumbar fusion. PMH including but not limited to chronic back pain with R LE weakness and incomplete foot drop, HTN, cardiomyopathy, lumbar spine surgeries (1986, 1991, 1993, fusion in 2011) and ACDF in 2009 and 2010.   Clinical Impression   Patient evaluated by Occupational Therapy with no further acute OT needs identified. All education has been completed and the patient has no further questions. See below for any follow-up Occupational Therapy or equipment needs. OT to sign off. Thank you for referral.      Follow Up Recommendations  No OT follow up    Equipment Recommendations  None recommended by OT    Recommendations for Other Services       Precautions / Restrictions Precautions Precautions: Back Precaution Booklet Issued: Yes (comment) Precaution Comments: able to recall 3/3 Required Braces or Orthoses: Spinal Brace Spinal Brace: Lumbar corset Restrictions Weight Bearing Restrictions: No      Mobility Bed Mobility Overal bed mobility: Needs Assistance Bed Mobility: Rolling;Sit to Supine Rolling: Supervision Sidelying to sit: Min assist   Sit to supine: Min guard   General bed mobility comments: cues to complete bed mobility  Transfers Overall transfer level: Needs assistance Equipment used: 2 person hand held assist Transfers: Sit to/from Stand Sit to Stand: Min guard         General transfer comment: pt required increased time, min guard for safety and 1 person HHA    Balance Overall balance assessment: Needs assistance Sitting-balance support: Feet supported;No upper extremity supported Sitting balance-Leahy Scale: Good     Standing balance support: During functional activity;Bilateral upper extremity supported Standing balance-Leahy Scale:  Poor Standing balance comment: pt reliant on 1 person HHA                            ADL Overall ADL's : Needs assistance/impaired Eating/Feeding: Set up;Sitting   Grooming: Oral care;Wash/dry face;Wash/dry hands;Brushing hair;Supervision/safety;Standing Grooming Details (indicate cue type and reason): single UE support at all times Upper Body Bathing: Minimal assistance   Lower Body Bathing: Moderate assistance   Upper Body Dressing : Minimal assistance   Lower Body Dressing: Moderate assistance Lower Body Dressing Details (indicate cue type and reason): able to cross L Le and unable to full cross R LE Toilet Transfer: Minimal assistance       Tub/ Shower Transfer: Minimal assistance Tub/Shower Transfer Details (indicate cue type and reason): simulated and requires Spouse UE support Functional mobility during ADLs: Minimal assistance    Back handout provided and reviewed adls in detail. Pt educated on: clothing between brace, never sleep in brace, set an alarm at night for medication, avoid sitting for long periods of time, correct bed positioning for sleeping, correct sequence for bed mobility, avoiding lifting more than 5 pounds and never wash directly over incision. All education is complete and patient indicates understanding.     Vision Vision Assessment?: No apparent visual deficits   Perception     Praxis      Pertinent Vitals/Pain Pain Assessment: Faces Pain Score: 7  Faces Pain Scale: Hurts whole lot Pain Location: R le front of leg shooting downward Pain Descriptors / Indicators: Sore;Grimacing;Guarding Pain Intervention(s): Premedicated before session;Repositioned;Monitored during session;Limited activity within patient's tolerance     Hand Dominance Right  Extremity/Trunk Assessment Upper Extremity Assessment Upper Extremity Assessment: Overall WFL for tasks assessed   Lower Extremity Assessment Lower Extremity Assessment: Defer to PT  evaluation   Cervical / Trunk Assessment Cervical / Trunk Assessment: Other exceptions Cervical / Trunk Exceptions: s/p lumbar sx   Communication Communication Communication: No difficulties   Cognition Arousal/Alertness: Awake/alert Behavior During Therapy: WFL for tasks assessed/performed Overall Cognitive Status: Within Functional Limits for tasks assessed                     General Comments       Exercises       Shoulder Instructions      Home Living Family/patient expects to be discharged to:: Private residence Living Arrangements: Spouse/significant other;Children Available Help at Discharge: Family;Available 24 hours/day Type of Home: House Home Access: Stairs to enter [employer] of Steps: 3 Entrance Stairs-Rails: None Home Layout: One level     Bathroom Shower/Tub: Astronomer Accessibility: Yes   Home Equipment: Environmental consultant - 2 wheels;Bedside commode;Shower seat;Wheelchair - manual;Grab bars - toilet;Grab bars - tub/shower;Hand held shower head   Additional Comments: pt made home accessible for father-in-law       Prior Functioning/Environment Level of Independence: Independent                 OT Problem List:     OT Treatment/Interventions:      OT Goals(Current goals can be found in the care plan section) Acute Rehab OT Goals Patient Stated Goal: return home  OT Frequency:     Barriers to D/C:            Co-evaluation              End of Session Equipment Utilized During Treatment: Gait belt Nurse Communication: Mobility status;Precautions  Activity Tolerance: Patient tolerated treatment well Patient left: in bed;with call bell/phone within reach;with family/visitor present   Time: 0940-1002 OT Time Calculation (min): 22 min Charges:  OT General Charges $OT Visit: 1 Procedure OT Evaluation $OT Eval Moderate Complexity: 1 Procedure G-Codes:    Parke Poisson B 06-26-2016, 10:18  AM   Jeri Modena   OTR/L [phone] Office: [phone] .

## 2016-06-16 NOTE — Evaluation (Signed)
Physical Therapy Evaluation Patient Details Name: Lindsey Rodriguez MRN: [id number] DOB: 04-22-1966 Today's Date: 06/16/2016   History of Present Illness  Pt is a 50 y/o female s/p L3-4 R anterior lumbar fusion. PMH including but not limited to chronic back pain with R LE weakness and incomplete foot drop, HTN, cardiomyopathy, lumbar spine surgeries (1986, 1991, 1993, fusion in 2011) and ACDF in 2009 and 2010.  Clinical Impression  Pt presented supine in bed with HOB elevated, awake and willing to participate in therapy session. Prior to admission, pt reported that she was independent with functional mobility and ADLs. Pt required min A at trunk to achieve upright sitting at EOB, mod A to Rodriguez lumbar corset in sitting, and min guard with 2HHA to ambulate. Pt moving very slowly secondary to pain. PT gave pt back precautions handout to pt and reviewed 3/3 precautions with pt and her husband. Pt would continue to benefit from skilled physical therapy services at this time while admitted to address her below listed limitations in order to improve her overall safety and independence with functional mobility.      Follow Up Recommendations No PT follow up;Supervision for mobility/OOB    Equipment Recommendations  None recommended by PT    Recommendations for Other Services       Precautions / Restrictions Precautions Precautions: Back Precaution Booklet Issued: Yes (comment) Precaution Comments: PT reviewed 3/3 spinal precautions with pt and husband. Required Braces or Orthoses: Spinal Brace Spinal Brace: Lumbar corset Restrictions Weight Bearing Restrictions: No      Mobility  Bed Mobility Overal bed mobility: Needs Assistance Bed Mobility: Rolling;Sidelying to Sit Rolling: Supervision Sidelying to sit: Min assist       General bed mobility comments: pt required increased time, use of bed rails, VC'ing for log roll technique and min A at trunk to achieve upright sitting at  EOB  Transfers Overall transfer level: Needs assistance Equipment used: 2 person hand held assist Transfers: Sit to/from Stand Sit to Stand: Min guard         General transfer comment: pt required increased time, min guard for safety and 2 person HHA  Ambulation/Gait Ambulation/Gait assistance: Min guard Ambulation Distance (Feet): 150 Feet Assistive device: 2 person hand held assist Gait Pattern/deviations: Step-through pattern;Decreased step length - right;Decreased step length - left;Decreased stride length Gait velocity: decreased Gait velocity interpretation: Below normal speed for age/gender General Gait Details: pt with very slow cadence secondary to pain in hips and thighs. No instability or LOB noted.  Stairs            Wheelchair Mobility    Modified Rankin (Stroke Patients Only)       Balance Overall balance assessment: Needs assistance Sitting-balance support: Feet supported;No upper extremity supported Sitting balance-Leahy Scale: Good     Standing balance support: During functional activity;Bilateral upper extremity supported Standing balance-Leahy Scale: Poor Standing balance comment: pt reliant on 2 person HHA                             Pertinent Vitals/Pain Pain Assessment: 0-10 Pain Score: 7  Pain Location: back and bilateral thighs Pain Descriptors / Indicators: Sore;Grimacing;Guarding Pain Intervention(s): Monitored during session;Repositioned    Home Living Family/patient expects to be discharged to:: Private residence Living Arrangements: Spouse/significant other;Children Available Help at Discharge: Family;Available 24 hours/day Type of Home: House Home Access: Stairs to enter Entrance Stairs-Rails: None Entrance Stairs-Number of Steps: 3 Home Layout: One  level Home Equipment: Walker - 2 wheels;Bedside commode;Shower seat;Wheelchair - manual Additional Comments: pt made home accessible for father-in-law     Prior  Function Level of Independence: Independent               Hand Dominance        Extremity/Trunk Assessment   Upper Extremity Assessment Upper Extremity Assessment: Overall WFL for tasks assessed    Lower Extremity Assessment Lower Extremity Assessment: Overall WFL for tasks assessed    Cervical / Trunk Assessment Cervical / Trunk Assessment: Other exceptions Cervical / Trunk Exceptions: s/p lumbar sx  Communication   Communication: No difficulties  Cognition Arousal/Alertness: Awake/alert Behavior During Therapy: WFL for tasks assessed/performed Overall Cognitive Status: Within Functional Limits for tasks assessed                      General Comments      Exercises     Assessment/Plan    PT Assessment Patient needs continued PT services  PT Problem List Decreased activity tolerance;Decreased balance;Decreased mobility;Decreased coordination;Decreased knowledge of use of DME;Decreased safety awareness;Pain          PT Treatment Interventions DME instruction;Gait training;Stair training;Functional mobility training;Therapeutic activities;Therapeutic exercise;Balance training;Neuromuscular re-education;Patient/family education    PT Goals (Current goals can be found in the Care Plan section)  Acute Rehab PT Goals Patient Stated Goal: return home PT Goal Formulation: With patient/family Time For Goal Achievement: 06/30/16 Potential to Achieve Goals: Good    Frequency Min 5X/week   Barriers to discharge        Co-evaluation               End of Session Equipment Utilized During Treatment: Gait belt;Back brace Activity Tolerance: Patient limited by pain Patient left: in bed;with call bell/phone within reach;with family/visitor present;Other (comment) (pt sitting EOB to eat breakfast) Nurse Communication: Mobility status         Time: [id number] PT Time Calculation (min) (ACUTE ONLY): 35 min   Charges:   PT Evaluation $PT Eval  Moderate Complexity: 1 Procedure PT Treatments $Gait Training: 8-22 mins   PT G CodesClearnce Rodriguez Lindsey Rodriguez 06/16/2016, 9:53 AM Lindsey Rodriguez, PT, DPT [phone]

## 2016-06-16 NOTE — Progress Notes (Signed)
Pt doing well. Pt and husband given D/C instructions with verbal understanding. Pt's Rx's were faxed to pharm per Dr. Vertell Limber. Pt's incision is clean and dry with no sign of infection. Pt's IV was removed prior to D/C. Pt D/C'd home via wheelchair @ 1320 per MD order. Pt is stable @ D/C and has no other needs at this time. Holli Humbles, RN

## 2016-06-20 NOTE — Anesthesia Postprocedure Evaluation (Signed)
Anesthesia Post Note  Patient: Lindsey Rodriguez  Procedure(s) Performed: Procedure(s) (LRB): RIGHT LUMBAR THREE-FOUR ANTEROLATERAL LUMBAR INTERBODY FUSION WITH LATERAL PLATE (Right)  Patient location during evaluation: PACU Anesthesia Type: General Level of consciousness: awake and alert Pain management: pain level controlled Vital Signs Assessment: post-procedure vital signs reviewed and stable Respiratory status: spontaneous breathing, nonlabored ventilation, respiratory function stable and patient connected to nasal cannula oxygen Cardiovascular status: stable Postop Assessment: no signs of nausea or vomiting Anesthetic complications: no       Last Vitals:  Vitals:   06/16/16 0400 06/16/16 0758  BP: 119/74 105/70  Pulse: 69 67  Resp: 18 18  Temp: 36.8 C 36.4 C    Last Pain:  Vitals:   06/16/16 0930  TempSrc:   PainSc: Asleep                 Lavante Toso

## 2016-06-23 ENCOUNTER — Emergency Department (HOSPITAL_COMMUNITY): Payer: Medicare Other

## 2016-06-23 ENCOUNTER — Emergency Department (HOSPITAL_COMMUNITY)
Admission: EM | Admit: 2016-06-23 | Discharge: 2016-06-23 | Disposition: A | Discharge status: Home / Self Care | Payer: Medicare Other | Attending: Emergency Medicine | Admitting: Emergency Medicine

## 2016-06-23 ENCOUNTER — Encounter (HOSPITAL_COMMUNITY): Payer: Self-pay

## 2016-06-23 DIAGNOSIS — R0789 Other chest pain: Secondary | ICD-10-CM

## 2016-06-23 DIAGNOSIS — I10 Essential (primary) hypertension: Secondary | ICD-10-CM | POA: Insufficient documentation

## 2016-06-23 DIAGNOSIS — R079 Chest pain, unspecified: Secondary | ICD-10-CM

## 2016-06-23 DIAGNOSIS — F606 Avoidant personality disorder: Secondary | ICD-10-CM | POA: Insufficient documentation

## 2016-06-23 DIAGNOSIS — Z87891 Personal history of nicotine dependence: Secondary | ICD-10-CM | POA: Diagnosis not present

## 2016-06-23 LAB — LIPASE, BLOOD: Lipase: 40 U/L (ref 11–51)

## 2016-06-23 LAB — BASIC METABOLIC PANEL
Anion gap: 11 (ref 5–15)
BUN: 13 mg/dL (ref 6–20)
CO2: 25 mmol/L (ref 22–32)
Calcium: 9.9 mg/dL (ref 8.9–10.3)
Chloride: 102 mmol/L (ref 101–111)
Creatinine, Ser: 0.69 mg/dL (ref 0.44–1.00)
GFR calc Af Amer: >60 mL/min (ref >60)
GFR calc non Af Amer: >60 mL/min (ref >60)
Glucose, Bld: 95 mg/dL (ref 65–99)
Potassium: 3.9 mmol/L (ref 3.5–5.1)
Sodium: 138 mmol/L (ref 135–145)

## 2016-06-23 LAB — I-STAT TROPONIN, ED: Troponin i, poc: 0 ng/mL (ref 0.00–0.08)

## 2016-06-23 LAB — CBC
HCT: 39.5 % (ref 36.0–46.0)
Hemoglobin: 13.4 g/dL (ref 12.0–15.0)
MCH: 32.2 pg (ref 26.0–34.0)
MCHC: 33.9 g/dL (ref 30.0–36.0)
MCV: 95 fL (ref 78.0–100.0)
Platelets: 401 10*3/uL — ABNORMAL HIGH (ref 150–400)
RBC: 4.16 MIL/uL (ref 3.87–5.11)
RDW: 13.6 % (ref 11.5–15.5)
WBC: 11.1 10*3/uL — ABNORMAL HIGH (ref 4.0–10.5)

## 2016-06-23 LAB — D-DIMER, QUANTITATIVE: D-Dimer, Quant: 1.82 ug/mL-FEU — ABNORMAL HIGH (ref 0.00–0.50)

## 2016-06-23 LAB — HEPATIC FUNCTION PANEL
ALT: 31 U/L (ref 14–54)
AST: 41 U/L (ref 15–41)
Albumin: 4 g/dL (ref 3.5–5.0)
Alkaline Phosphatase: 64 U/L (ref 38–126)
Bilirubin, Direct: 0.1 mg/dL (ref 0.1–0.5)
Indirect Bilirubin: 0.7 mg/dL (ref 0.3–0.9)
Total Bilirubin: 0.8 mg/dL (ref 0.3–1.2)
Total Protein: 7.2 g/dL (ref 6.5–8.1)

## 2016-06-23 MED ORDER — NITROGLYCERIN 0.4 MG SL SUBL
0.4000 mg | SUBLINGUAL_TABLET | Freq: Once | SUBLINGUAL | Status: DC
  Filled 2016-06-23: qty 1

## 2016-06-23 MED ORDER — GI COCKTAIL ~~LOC~~
30.0000 mL | Freq: Once | ORAL | Status: AC
  Administered 2016-06-23: 30 mL via ORAL
  Filled 2016-06-23: qty 30

## 2016-06-23 MED ORDER — SODIUM CHLORIDE 0.9 % IV BOLUS (SEPSIS)
1000.0000 mL | Freq: Once | INTRAVENOUS | Status: AC
  Administered 2016-06-23: 1000 mL via INTRAVENOUS

## 2016-06-23 MED ORDER — ONDANSETRON HCL 4 MG/2ML IJ SOLN
4.0000 mg | Freq: Once | INTRAMUSCULAR | Status: AC
  Administered 2016-06-23: 4 mg via INTRAVENOUS
  Filled 2016-06-23: qty 2

## 2016-06-23 MED ORDER — IOPAMIDOL (ISOVUE-370) INJECTION 76%
INTRAVENOUS | Status: AC
  Administered 2016-06-23: 100 mL via INTRAVENOUS
  Filled 2016-06-23: qty 100

## 2016-06-23 MED ORDER — LORAZEPAM 2 MG/ML IJ SOLN
1.0000 mg | Freq: Once | INTRAMUSCULAR | Status: AC
  Administered 2016-06-23: 1 mg via INTRAVENOUS
  Filled 2016-06-23: qty 1

## 2016-06-23 MED ORDER — HYDROMORPHONE HCL 2 MG/ML IJ SOLN
1.0000 mg | Freq: Once | INTRAMUSCULAR | Status: AC
  Administered 2016-06-23: 1 mg via INTRAVENOUS
  Filled 2016-06-23: qty 1

## 2016-06-23 NOTE — Discharge Instructions (Signed)
As discussed, your evaluation today has been largely reassuring.  But, it is important that you monitor your condition carefully, and do not hesitate to return to the ED if you develop new, or concerning changes in your condition. ? ?Otherwise, please follow-up with your physician for appropriate ongoing care. ? ?

## 2016-06-23 NOTE — ED Notes (Signed)
Pt denies taking ASA or nitro for pain

## 2016-06-23 NOTE — ED Notes (Signed)
Patient transported to X-ray 

## 2016-06-23 NOTE — ED Provider Notes (Signed)
[facility] DEPT Provider Note   CSN: [id number] Arrival date & time: 06/23/16  [id number]     History   Chief Complaint Chief Complaint  Patient presents with  . Chest Pain    HPI Lindsey Rodriguez is a 50 y.o. female.  Patient presents to the ER for evaluation of chest pain. Patient reports that she awakened around 1 AM with discomfort in her chest. She got up, and a small snack and then went back to bed. She then awakened around 4 AM with severe diffuse chest pain. She reports that the pain is constant but worsens when she takes a breath. She does have a history of cardiomyopathy. Her cardiologist is Dr. Einar Gip. She does not have any known history of coronary artery disease. Cardiomyopathy is nonischemic.      Past Medical History:  Diagnosis Date  . Anemia   . Anxiety panic attacks   [name]  . Arthritis    knees  . Cardiomyopathy, nonischemic ([place]) 04/07/2016   EF left ventricule calculated on Lexiscan stress test at 33% with global hypokinesis.   . Chronic back pain   . Chronic neck pain   . Depression   . Dysrhythmia    pvc's with cardiomyopathy  . Family history of adverse reaction to anesthesia    mother has problems waking up  . Fibromyalgia    and neuropathy of feet  . History of kidney stones    lithotripsy  03/2016  . Hyperlipidemia   . Hypertension   . Kidney stone    History  . Kidney stones h/o  . Melanoma ([place]) RUE   [name]  . MGUS (monoclonal gammopathy of unknown significance) 06/2015   no treatment just watching recheck blood work 12/2015  . Migraine    OTC med prn - stress related  . Plantar fasciitis right  . Seasonal allergies   . SVD (spontaneous vaginal delivery)    x 3  . Tendonitis of foot right    Patient Active Problem List   Diagnosis Date Noted  . Lumbar scoliosis 06/15/2016  . Chondrocalcinosis 05/15/2016  . Primary osteoarthritis of both knees 05/15/2016  . Tendinopathy of right shoulder 05/15/2016  . Spondylosis of  lumbar region 05/15/2016  . Plantar fasciitis, bilateral 05/15/2016  . Renal calcinosis 05/15/2016  . Malignant melanoma of right upper extremity including shoulder ([place]) 05/15/2016  . Nonischemic cardiomyopathy ([place]) 04/10/2016  . Post op infection 12/18/2015  . Postoperative state 11/24/2015  . Chronic migraine without aura without status migrainosus, not intractable 08/04/2015  . Fibromyalgia 07/26/2015  . MGUS (monoclonal gammopathy of unknown significance) 06/10/2015  . DJD (degenerative joint disease), cervical 03/02/2015  . Migraine headache 09/05/2012  . Fatigue 10/26/2011  . Pure hyperglyceridemia 04/27/2011  . Essential hypertension, benign 04/17/2011  . Vitamin D deficiency 04/17/2011  . Neuropathy ([place]) 12/22/2010  . Headache 12/22/2010    Past Surgical History:  Procedure Laterality Date  . ABDOMINAL HYSTERECTOMY    . ABLATION ON ENDOMETRIOSIS N/A 11/24/2015   Procedure: ABLATION ON ENDOMETRIOSIS;  Surgeon: [name], MD;  Location: [facility];  Service: Gynecology;  Laterality: N/A;  . ANTERIOR LAT LUMBAR FUSION Right 06/15/2016   Procedure: RIGHT LUMBAR THREE-FOUR ANTEROLATERAL LUMBAR INTERBODY FUSION WITH LATERAL PLATE;  Surgeon: Erline Levine, MD;  Location: [facility];  Service: Neurosurgery;  Laterality: Right;  . BACK SURGERY  Dr.Stern, last sx Summer 2011   X 4  (L3-S1)  . BLADDER SUSPENSION N/A 11/24/2015   Procedure: TRANSVAGINAL TAPE (TVT) PROCEDURE;  Surgeon: [name], MD;  Location: [facility];  Service: Gynecology;  Laterality: N/A;  . COLONOSCOPY    . CYSTOCELE REPAIR  11/24/2015   Procedure: ANTERIOR REPAIR (CYSTOCELE);  Surgeon: [name], MD;  Location: [facility];  Service: Gynecology;;  . Consuela Mimes N/A 11/24/2015   Procedure: CYSTOSCOPY;  Surgeon: [name], MD;  Location: [facility];  Service: Gynecology;  Laterality: N/A;  . DILATION AND CURETTAGE OF UTERUS    . ENDOMETRIAL ABLATION  08/2000  . EYE SURGERY Bilateral    Lasik   . KIDNEY  STONE SURGERY  2011  . MELANOMA EXCISION  05/2015   right forearm; [name]  . NECK SURGERY  [name]  Summer 2011   X 4  (C4-T1 Level)  . RHINOPLASTY    . ROBOTIC ASSISTED BILATERAL SALPINGO OOPHERECTOMY Bilateral 11/24/2015   Procedure: ROBOTIC ASSISTED BILATERAL SALPINGO OOPHORECTOMY;  Surgeon: [name], MD;  Location: [facility];  Service: Gynecology;  Laterality: Bilateral;  . ROBOTIC ASSISTED TOTAL HYSTERECTOMY WITH SALPINGECTOMY Bilateral 11/24/2015   Procedure: ROBOTIC ASSISTED TOTAL HYSTERECTOMY WITH SALPINGECTOMY;  Surgeon: [name], MD;  Location: [facility];  Service: Gynecology;  Laterality: Bilateral;  . TONSILLECTOMY    . WISDOM TOOTH EXTRACTION      OB History    Gravida Para Term Preterm AB Living   3 3       3    SAB TAB Ectopic Multiple Live Births                   Home Medications    Prior to Admission medications   Medication Sig Start Date End Date Taking? Authorizing Provider  buPROPion (WELLBUTRIN XL) 150 MG 24 hr tablet Take 150 mg by mouth daily.    Historical Provider, MD  carvedilol (COREG) 25 MG tablet Take 6.25-12.5 mg by mouth See admin instructions. TAKES 6.25 MG IN THE MORNING AND 6.25 MG IN THE EVENING    Historical Provider, MD  cetirizine (ZYRTEC) 10 MG tablet Take 10 mg by mouth daily.    Historical Provider, MD  divalproex (DEPAKOTE ER) 500 MG 24 hr tablet TAKE 1 TABLET (500 MG TOTAL) BY MOUTH DAILY. 02/16/16   Pieter Partridge, DO  FLUoxetine (PROZAC) 40 MG capsule Take 40 mg by mouth daily.  10/25/14   Historical Provider, MD  lisinopril (PRINIVIL,ZESTRIL) 20 MG tablet Take 10 mg by mouth every evening.     Historical Provider, MD  LORazepam (ATIVAN) 0.5 MG tablet Take 1-2 tablets (0.5-1 mg total) by mouth 3 (three) times daily as needed for anxiety. Prn anxiety 12/18/15   [name], MD  mirabegron ER (MYRBETRIQ) 50 MG TB24 tablet Take 50 mg by mouth daily.    Historical Provider, MD  MISC NATURAL PRODUCTS PO Take 2 capsules by mouth 2  (two) times daily. Rejuvenixx    Historical Provider, MD  MISC NATURAL PRODUCTS PO Take 1 capsule by mouth 2 (two) times daily. Optimal- V    Historical Provider, MD  MISC NATURAL PRODUCTS PO Take 1 capsule by mouth 2 (two) times daily. Optimal-M    Historical Provider, MD  MISC NATURAL PRODUCTS PO Take 1 capsule by mouth 2 (two) times daily. MagnaCal-D    Historical Provider, MD  pregabalin (LYRICA) 75 MG capsule Take 1 capsule (75 mg total) by mouth 2 (two) times daily. 03/08/16   Rita Ohara, MD  tiZANidine (ZANAFLEX) 2 MG tablet Take 2 mg by mouth every 6 (six) hours as needed for muscle spasms.  Historical Provider, MD    Family History Family History  Problem Relation Age of Onset  . Hypertension Mother   . Depression Mother   . Migraines Mother   . Hypothyroidism Mother   . COPD Father   . Heart failure Father 31  . Breast cancer Cousin     40's  . Cancer Cousin     breast; in her 16's  . Diabetes Neg Hx     Social History Social History  Substance Use Topics  . Smoking status: Former Smoker    Packs/day: 0.10    Types: Cigarettes    Quit date: 07/04/1987  . Smokeless tobacco: Never Used  . Alcohol use 0.0 oz/week     Comment: ONCE A MONTH Wine     Allergies   Penicillins; Codeine; Voltaren [diclofenac]; and Erythromycin   Review of Systems Review of Systems  Cardiovascular: Positive for chest pain.  All other systems reviewed and are negative.    Physical Exam Updated Vital Signs BP 110/69   Pulse 69   Temp 97.8 F (36.6 C) (Oral)   Resp 15   Ht 5\' 6"  (1.676 m)   Wt 145 lb (65.8 kg)   LMP 12/16/2012   SpO2 100%   BMI 23.40 kg/m   Physical Exam  Constitutional: She is oriented to person, place, and time. She appears well-developed and well-nourished. She appears distressed.  HENT:  Head: Normocephalic and atraumatic.  Right Ear: Hearing normal.  Left Ear: Hearing normal.  Nose: Nose normal.  Mouth/Throat: Oropharynx is clear and moist and mucous  membranes are normal.  Eyes: Conjunctivae and EOM are normal. Pupils are equal, round, and reactive to light.  Neck: Normal range of motion. Neck supple.  Cardiovascular: Regular rhythm, S1 normal and S2 normal.  Exam reveals no gallop and no friction rub.   No murmur heard. Pulmonary/Chest: Effort normal and breath sounds normal. No respiratory distress. She exhibits no tenderness.  Abdominal: Soft. Normal appearance and bowel sounds are normal. There is no hepatosplenomegaly. There is no tenderness. There is no rebound, no guarding, no tenderness at McBurney's point and negative Murphy's sign. No hernia.  Musculoskeletal: Normal range of motion.  Neurological: She is alert and oriented to person, place, and time. She has normal strength. No cranial nerve deficit or sensory deficit. Coordination normal. GCS eye subscore is 4. GCS verbal subscore is 5. GCS motor subscore is 6.  Skin: Skin is warm, dry and intact. No rash noted. No cyanosis.  Psychiatric: Her speech is normal and behavior is normal. Thought content normal. Her mood appears anxious.  Nursing note and vitals reviewed.    ED Treatments / Results  Labs (all labs ordered are listed, but only abnormal results are displayed) Labs Reviewed  CBC - Abnormal; Notable for the following:       Result Value   WBC 11.1 (*)    Platelets 401 (*)    All other components within normal limits  D-DIMER, QUANTITATIVE (NOT AT [facility]) - Abnormal; Notable for the following:    D-Dimer, Quant 1.82 (*)    All other components within normal limits  BASIC METABOLIC PANEL  HEPATIC FUNCTION PANEL  LIPASE, BLOOD  I-STAT TROPOININ, ED    EKG  EKG Interpretation  Date/Time:  Friday June 23 2016 05:44:12 EST Ventricular Rate:  75 PR Interval:    QRS Duration: 81 QT Interval:  432 QTC Calculation: 483 R Axis:   -11 Text Interpretation:  Sinus rhythm Ventricular premature complex Inferior  infarct, old Probable anterior infarct, old No  significant change since last tracing Confirmed by [facility]  MD, [place] [phone]) on 06/23/2016 5:52:58 AM       Radiology Dg Chest 2 View  Result Date: 06/23/2016 CLINICAL DATA:  Initial evaluation for acute chest pain. EXAM: CHEST  2 VIEW COMPARISON:  Prior radiograph from 06/09/2016. FINDINGS: The cardiac and mediastinal silhouettes are stable in size and contour, and remain within normal limits. The lungs are mildly hypoinflated. No airspace consolidation, pleural effusion, or pulmonary edema is identified. There is no pneumothorax. No acute osseous abnormality identified. Fusion hardware noted within the cervical spine. IMPRESSION: Shallow lung inflation with no active cardiopulmonary disease identified. Electronically Signed   By: Jeannine Boga M.D.   On: 06/23/2016 06:14    Procedures Procedures (including critical care time)  Medications Ordered in ED Medications  ondansetron (ZOFRAN) injection 4 mg (4 mg Intravenous Given 06/23/16 0625)  HYDROmorphone (DILAUDID) injection 1 mg (1 mg Intravenous Given 06/23/16 [id number])     Initial Impression / Assessment and Plan / ED Course  I have reviewed the triage vital signs and the nursing notes.  Pertinent labs & imaging results that were available during my care of the patient were reviewed by me and considered in my medical decision making (see chart for details).  Clinical Course    Patient with history of nonischemic cardiomyopathy presents to the ER for evaluation of chest pain. Pain began several hours ago and then worsened prior to arrival to the ER. Patient is extremely anxious at arrival. She is tearful. Although patient is to sit is nonischemic cardiomyopathy, I do not see cardiac workup in recent history. Will require cardiac evaluation. Additionally, patient underwent lumbar surgery one week ago. Based on her pleuritic pain, will require evaluation for possible PE. Will initiate work up and sign out to oncoming ER  physician to follow and disposition.  Final Clinical Impressions(s) / ED Diagnoses   Final diagnoses:  Chest pain, unspecified type    New Prescriptions New Prescriptions   No medications on file     Orpah Greek, MD 06/23/16 [phone]

## 2016-06-23 NOTE — ED Provider Notes (Signed)
11:36 AM Patient in no distress, awake, alert.  With her husband we discussed all findings at length.  Patient will f/u w PMD.   Carmin Muskrat, MD 06/23/16 1136

## 2016-06-23 NOTE — ED Triage Notes (Signed)
Pt woke up at 0230 with substernal chest pain that radiates through to back. Pt reports nausea, diaphoresis, dizziness. Pt had spinal sx 06/15/16. Pt tearful in trg.

## 2016-06-23 NOTE — ED Notes (Signed)
ED Provider at bedside. 

## 2016-06-23 NOTE — ED Notes (Signed)
Patient transported to CT 

## 2016-06-23 NOTE — ED Notes (Signed)
Pt was able to use restroom indepedently and wheeled down to restroom.

## 2016-06-23 NOTE — ED Notes (Signed)
Pt reports still in pain but has improved some. Still reports feeling lightheaded.

## 2016-07-05 NOTE — Progress Notes (Signed)
Chief Complaint  Patient presents with  . Follow-up    from 12/22 ED visit and follow on Lyrica.   Patient presents for routine 6 month follow up/med check, and ER follow up.  f/u on HTN:  She previously took Lotrel, but it was stopped after weight loss and BP's had remained good.   While she was undergoing lithotripsy for kidney stones in September, she started having some PVCs with bigeminy. The procedure was stopped and she was referred to cardiologist. This had actually been noted a couple of weeks prior at her CPE, and it was recommended she f/u with her cardiologist, but hadn't yet done so (she was asymptomatic). She was diagnosed with nonischemic cardiomyopathy her ejection fraction was 33% per patient. She was started on lisinopril and carvedilol, and doses were titrated up.   She had dizziness and low BP's (had syncopal episode in bathroom) and had to have the doses lowered again. Feeling much better on the lower doses. Denies side effects from medications.  Hypertriglyceridemia: Fenofibrate was stopped after her last physical (after change in diet and weight loss), and had labs repeated 2 months later Lab Results  Component Value Date   CHOL 190 05/03/2016   HDL 65 05/03/2016   LDLCALC 104 05/03/2016   TRIG 103 05/03/2016   CHOLHDL 2.9 05/03/2016   TG remained normal, and she was advised she could remain off the fenofibrate.  She is s/p L3-4 fusion in December by Dr. Vertell Limber for spinal stenosis, after she had developed right foot drop.  The foot drop has improved, but still has weakness in the right leg.  She has f/u next week, and expects to start PT. Wants to see PT who is also trainer in [place] (mother offered to pay half).  She is here for ER f/u. She was seen in ER 12/22 with complaint of chest pain. Patient reports that she awakened around 1 AM with discomfort in her chest. She got up, and a small snack and then went back to bed. She then awakened around 4 AM with severe  diffuse chest pain. She reports that the pain is constant but worsens when she takes a breath. She had elevated D-dimer, but normal CT angio, no evidence of PE. Troponin was negative, as were other labs (see below). She was never really given a diagnosis.  She still had some discomfort when she left. She spent the next 2 days in bed.  Didn't pick up or play with her granddaughter.  She felt wiped out, fatigued, muscles were very heavy.  The chest pain had eased off (gradually--not what kept her in bed). She had decreased appetite, but maintained her fluids. Energy has gradually improved. She reports being "almost" back to baseline.  Still fatigues easily--she "goes and goes and goes, overdoes it and sets me back 3 days".  She is trying to conserve energy and not overdo it.  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 Last seen a month ago. SPEP showed stable M protein at 0.8g/dl which is unchanged from her last visit.  She continues to see [name] for depression and anxiety.  At her recent visit, Wellbutrin dose was increased to 300 mg, added trazadone 150qHS, lorazepam TID prn anxiety. Also on deplin 15mg /d and prozac 40mg  daily.  She didn't tolerate 300mg  of Wellbutrin, so is back to 150mg  (felt weird, almost paranoid).  She never started the trazadone--sleep improved and didn't need it. Using lorazepam more often. Coaching for the Next 56 Days (diet)--excited,  but anxious about it also. Lorazepam works very well for her, 0.5mg , often needing 3-4 times daily.  No longer having much pain in the arms.  Last neck surgery was in 2010. The right arm pain comes and goes.  MRI of c-spine 11/26/14 which showed: Progression of disease at C3-4 where a left paracentral protrusion contacts and deforms the left hemi cord. There is mild deflection of the cord to the right. Status post C4-T2 fusion. The central canal and foramina appear open at each level.  Neuropathy in feet--Lyrica has been effective.  Needs  to get patient assistance again for this year. If she doesn't take Lyrica, she cannot stand on her feet, due to neuropath.  She thinks she hasn't needed to take as much pain medication (with the recent back surgery) as she normally would have, feels like the Lyrica is very effective at 75mg  BID.   PMH, PSH, SH reviewed  Outpatient Encounter Prescriptions as of 07/07/2016  Medication Sig Note  . buPROPion (WELLBUTRIN XL) 150 MG 24 hr tablet Take 150 mg by mouth daily.   . carvedilol (COREG) 25 MG tablet Take 6.25 mg by mouth 2 (two) times daily with a meal.    . divalproex (DEPAKOTE ER) 500 MG 24 hr tablet TAKE 1 TABLET (500 MG TOTAL) BY MOUTH DAILY.   Marland Kitchen FLUoxetine (PROZAC) 40 MG capsule Take 40 mg by mouth daily.    Marland Kitchen lisinopril (PRINIVIL,ZESTRIL) 20 MG tablet Take 10 mg by mouth every evening.    Marland Kitchen LORazepam (ATIVAN) 0.5 MG tablet Take 1-2 tablets (0.5-1 mg total) by mouth 3 (three) times daily as needed for anxiety. Prn anxiety 07/07/2016: Using 1 tablet 3-4 times daily ([name])  . mirabegron ER (MYRBETRIQ) 50 MG TB24 tablet Take 50 mg by mouth daily.   Marland Kitchen MISC NATURAL PRODUCTS PO Take 2 capsules by mouth 2 (two) times daily. Rejuvenixx   . MISC NATURAL PRODUCTS PO Take 1 capsule by mouth 2 (two) times daily. Optimal- V   . MISC NATURAL PRODUCTS PO Take 1 capsule by mouth 2 (two) times daily. Optimal-M   . MISC NATURAL PRODUCTS PO Take 1 capsule by mouth 2 (two) times daily. MagnaCal-D   . oxyCODONE-acetaminophen (PERCOCET/ROXICET) 5-325 MG tablet Take 0.5 tablets by mouth every 4 (four) hours as needed for severe pain. 07/07/2016: Using 1 tablet at night, none during day  . pregabalin (LYRICA) 75 MG capsule Take 1 capsule (75 mg total) by mouth 2 (two) times daily.   Marland Kitchen tiZANidine (ZANAFLEX) 2 MG tablet Take 2 mg by mouth every 6 (six) hours as needed for muscle spasms. 07/07/2016: Not using much, about 3x/month prn neck pain  . [DISCONTINUED] pregabalin (LYRICA) 75 MG capsule Take 1 capsule (75 mg  total) by mouth 2 (two) times daily.   . cetirizine (ZYRTEC) 10 MG tablet Take 10 mg by mouth daily.    No facility-administered encounter medications on file as of 07/07/2016.    Allergies  Allergen Reactions  . Penicillins Shortness Of Breath and Rash    Has patient had a PCN reaction causing immediate rash, facial/tongue/throat swelling, SOB or lightheadedness with hypotension: no Has patient had a PCN reaction causing severe rash involving mucus membranes or skin necrosis: no Has patient had a PCN reaction that required hospitalization no Has patient had a PCN reaction occurring within the last 10 years: no If all of the above answers are "NO", then may proceed with Cephalosporin use.   . Codeine Other (See Comments)  Manic depressive  . Voltaren [Diclofenac] Nausea And Vomiting and Other (See Comments)    Pt stated "tore stomach up"  . Erythromycin Rash   ROS: no fever, chills, headaches, dizziness.  Chest pain resolved.  +fatigue/exhaustion is very active.  No shortness of breath. No edema, bleeding, bruising, rash. +neuropathy, controlled, per HPI.  Moods are improved--+anxiety, controlled with frequent use of lorazepam in addition to her many other medications, per [name].  No nausea, vomiting, diarrhea, urinary complaints.  See HPI.  PHYSICAL EXAM:  BP 104/60 (BP Location: Left Arm, Patient Position: Sitting, Cuff Size: Normal)   Pulse 64   Ht 5\' 6"  (1.676 m)   Wt 146 lb (66.2 kg)   LMP 12/16/2012   BMI 23.57 kg/m   Well appearing, pleasant female. HEENT: conjunctiva and sclera are clear, OP clear Neck: no lymphadenopathy, thyromegaly or carotid bruit Heart: regular rate and rhythm Lungs: clear bilaterally Back: Spine nontender.  WHSS midline.  Newly healing lesions vertically and horizontally in right lower back. Abdomen: soft, nontender, no organomegaly or mass Extremities: normal pulses, no edema Skin: normal turgor, no rash Neuro: alert and oriented. Cranial  nerves intact.  Normal gait Psych: full range of affect, normal eye contact, hygiene and grooming.  At times she is slow in her speech; doesn't appear lethargic.   Lab Results  Component Value Date   DDIMER 1.82 (H) 06/23/2016     Chemistry      Component Value Date/Time   NA 138 06/23/2016 0552   NA 138 06/13/2016 1308   K 3.9 06/23/2016 0552   K 4.4 06/13/2016 1308   CL 102 06/23/2016 0552   CO2 25 06/23/2016 0552   CO2 27 06/13/2016 1308   BUN 13 06/23/2016 0552   BUN 18.4 06/13/2016 1308   CREATININE 0.69 06/23/2016 0552   CREATININE 0.7 06/13/2016 1308      Component Value Date/Time   CALCIUM 9.9 06/23/2016 0552   CALCIUM 10.1 06/13/2016 1308   ALKPHOS 64 06/23/2016 0613   ALKPHOS 73 06/13/2016 1308   AST 41 06/23/2016 0613   AST 24 06/13/2016 1308   ALT 31 06/23/2016 0613   ALT 30 06/13/2016 1308   BILITOT 0.8 06/23/2016 0613   BILITOT 0.29 06/13/2016 1308     Lab Results  Component Value Date   LIPASE 40 06/23/2016   Lab Results  Component Value Date   WBC 11.1 (H) 06/23/2016   HGB 13.4 06/23/2016   HCT 39.5 06/23/2016   MCV 95.0 06/23/2016   PLT 401 (H) 06/23/2016     ASSESSMENT/PLAN:  Chest pain, unspecified type - seen in ER; negative cardiac work-up. likely contributed by anxiety. Given sl elevated WBC and fatigue x few days, suspect viral syndrome; resolved  Nonischemic cardiomyopathy (HCC) - intolerant of higher dose carvedilol and lisinopril due to hypotension. Due to f/u with cardiologist and for repeat echo. Sounds symptomatic  Essential hypertension, benign - this had resolved with wt loss.  on meds now for cardiomyopathy (and doses are limited due to low BP's)  Pure hyperglyceridemia - resolved, off medication.  continue lowfat diet  Neuropathy ([place]) - doing well on lyrica 75mg  BID.  paperwork filled out for Pt assistance program - Plan: pregabalin (LYRICA) 75 MG capsule   Refill lyrica, DAW, 90d d supply with refills.  Forms to patient  assistance program   F/u as scheduled at CPE in September

## 2016-07-06 ENCOUNTER — Telehealth: Payer: Self-pay | Admitting: Family Medicine

## 2016-07-06 NOTE — Telephone Encounter (Signed)
I called [place] has expired & pt must renew for 2018.  I printed new application and they need new written Rx for Lyrica.  Called & informed spouse & he will pick up application at appt tomorrow. Dr. Tomi Bamberger I put form in your folder for your completion

## 2016-07-07 ENCOUNTER — Ambulatory Visit (INDEPENDENT_AMBULATORY_CARE_PROVIDER_SITE_OTHER): Payer: Medicare Other | Admitting: Family Medicine

## 2016-07-07 VITALS — BP 104/60 | HR 64 | Ht 66.0 in | Wt 146.0 lb

## 2016-07-07 DIAGNOSIS — G629 Polyneuropathy, unspecified: Secondary | ICD-10-CM

## 2016-07-07 DIAGNOSIS — I10 Essential (primary) hypertension: Secondary | ICD-10-CM

## 2016-07-07 DIAGNOSIS — E781 Pure hyperglyceridemia: Secondary | ICD-10-CM | POA: Diagnosis not present

## 2016-07-07 DIAGNOSIS — R079 Chest pain, unspecified: Secondary | ICD-10-CM

## 2016-07-07 DIAGNOSIS — I428 Other cardiomyopathies: Secondary | ICD-10-CM | POA: Diagnosis not present

## 2016-07-07 MED ORDER — PREGABALIN 75 MG PO CAPS: 75.0000 mg | ORAL_CAPSULE | Freq: Two times a day (BID) | ORAL | 3 refills | Status: DC

## 2016-07-07 NOTE — Telephone Encounter (Signed)
Done--put on Laura's desk. Unsure of question #2 on patient's page, whether or not insurance covered Lyrica.

## 2016-07-07 NOTE — Patient Instructions (Signed)
Continue your current medications. (and continue NOT to take the fenofibrate). Follow up as scheduled with Dr. Einar Gip next month.  Some of your fatigue with exertion may be related to the cardiomyopathy.  The echocardiogram planned will give more information.  I suspect you may have had a viral syndrome.  I'm glad you're feeling better.

## 2016-07-08 ENCOUNTER — Encounter: Payer: Self-pay | Admitting: Family Medicine

## 2016-07-11 ENCOUNTER — Telehealth: Payer: Self-pay | Admitting: Family Medicine

## 2016-07-11 NOTE — Telephone Encounter (Signed)
PT [place] husband and advised need copy tax returns to fax application

## 2016-07-17 ENCOUNTER — Encounter: Payer: Self-pay | Admitting: Family Medicine

## 2016-07-27 NOTE — Telephone Encounter (Signed)
Pt Assistance application was completed and faxed into [employer]

## 2016-08-07 ENCOUNTER — Encounter: Payer: Self-pay | Admitting: Neurology

## 2016-08-07 ENCOUNTER — Ambulatory Visit (INDEPENDENT_AMBULATORY_CARE_PROVIDER_SITE_OTHER): Payer: Medicare Other | Admitting: Neurology

## 2016-08-07 ENCOUNTER — Other Ambulatory Visit: Payer: Self-pay

## 2016-08-07 VITALS — BP 120/74 | HR 75 | Ht 66.0 in | Wt 143.7 lb

## 2016-08-07 DIAGNOSIS — G43009 Migraine without aura, not intractable, without status migrainosus: Secondary | ICD-10-CM | POA: Diagnosis not present

## 2016-08-07 DIAGNOSIS — M5417 Radiculopathy, lumbosacral region: Secondary | ICD-10-CM

## 2016-08-07 MED ORDER — DIHYDROERGOTAMINE MESYLATE 4 MG/ML NA SOLN: NASAL | 3 refills | Status: DC

## 2016-08-07 MED ORDER — AMBULATORY NON FORMULARY MEDICATION: 0 refills | Status: DC

## 2016-08-07 NOTE — Patient Instructions (Addendum)
Migraine Recommendations: 1.  We won't make any changes to the daily medications (Depakote) 2.  When you get a migraine, use the Migranal spray.  Spray once in each nostril at earliest onset of headache.  May repeat twice every 15 minutes if needed.  Do not exceed total of 6 sprays in 24 hours or total of 8 sprays in one week.  ADDENDUM: Will NOT prescribe Migranal due to cardiomyopathy.  Discussed with patient.  Instead, will try Lidocaine nasal drop. 3.  Limit use of pain relievers to no more than 2 days out of the week.  These medications include acetaminophen, ibuprofen, triptans and narcotics.  This will help reduce risk of rebound headaches. 4.  Be aware of common food triggers such as processed sweets, processed foods with nitrites (such as deli meat, hot dogs, sausages), foods with MSG, alcohol (such as wine), chocolate, certain cheeses, certain fruits (dried fruits, some citrus fruit), vinegar, diet soda. 4.  Avoid caffeine 5.  Routine exercise 6.  Proper sleep hygiene 7.  Stay adequately hydrated with water 8.  Keep a headache diary. 9.  Maintain proper stress management. 10.  Do not skip meals. 11.  Consider supplements:  Magnesium citrate 400mg  to 600mg  daily, riboflavin 400mg , Coenzyme Q 10 100mg  three times daily 12.  Follow up in 6 months or as needed.

## 2016-08-07 NOTE — Progress Notes (Signed)
NEUROLOGY FOLLOW UP OFFICE NOTE  GENE NISKA [id number]  HISTORY OF PRESENT ILLNESS: Lindsey Rodriguez is a 51 year old right-handed woman with chronic neck and back pain with neuropathy, fibromyalgia, cardiomyopathy, hypertension, plantar fasciitis and history of kidney stones and depression who follows up for migraine and lumbosacral radiculopathy.  She is accompanied by her husband who provides some history.   UPDATE: Headaches were well controlled until November, when she was waking up with daily headache.  They reduced in frequency in January, occurring about 3 days a week and has been headache-free over the past week.  They would last about 4 hours with rest. Antihypertensive medications:  amlodipine Antidepressant medications:  wellbutrin, fluoxetine Anticonvulsant medications:  Depakote ER 500mg , Lyrica 75mg  twice daily Vitamins/Herbal/Supplements:  Magnesium maleate 1250mg  twice daily, riboflavin 14mg , coenzyme Q10 30mg  She takes tizanidine sometimes for neck tightness   She underwent L3-4 fusion on 06/15/16.  This has caused increased stress.  Also change in weather may have contributed to increased seizure frequency.    HISTORY: Onset:  Childhood Location:  Varies (back of head, crown, bi-temporal, retro-orbital, band-like) Quality:  Retro-orbital throbbing, band-like vice, stabbing at back of head Initial Intensity:  4/10 constant, 8/10 when severe; August: Tension 6-9/10; Migraine 9/10 Aura:  no Prodrome:  no Associated symptoms:  Nausea, photophobia, phonophobia, tunnel vision Initial Duration:  Constant but severe episodes 8 hours (with sumatriptan and naproxen); August: Tension 2min to 3 hours; Migraine until goes to sleep Initial Frequency:  Daily but severe episodes occur once a week; August: Tension 3 days per week; Migraine once every 2 weeks) Triggers/exacerbating factors:  stress Relieving factors:  Ice pack Activity:  Cannot function at least once a  week.   Past abortive therapy:  Relpax 40mg , Treximet, Maxalt, Zomig po, Tramadol, sumatriptan 100mg  w/wo naproxen, sumatriptan NS, Zomig NS, Onzetra Xsail Past preventative therapy:  Cymbalta, Topamax, nortriptyline, sertraline, Effexor, zonisamide, propranolol (hypotension), biofeedback.    In January, she developed acute onset of low back pain with pain radiating down the posterior thigh, lateral lower leg and dorsum of foot to big toe on the right.  There is no associated numbness or weakness.  She has taken Flexeril, which is somewhat helpful, but makes her sleepy.  Pain is improved but she still notes pain when she stands up.  There was no preceding injury.  MRI of lumbar spine from 09/26/15 showed postsurgical changes at L4-5 and L5-S1, as well as spinal stenosis at L2-3, but no at the suspected level of L5.  She hand a NCV-EMG performed on 09/28/15, which showed evidence of chronic L5 radiculopathy but nothing acute.  She was referred to Dr. Renie Ora of pain management, where she was prescribed baclofen and Percocet and underwent bilateral L5/S1 transforaminal lumbar epidural steroid injections.  They work but she has had recurrent pain   To further evaluate numbness and weakness in right arm, he had an MRI of the cervical spine performed on 11/26/14, which showed post surgical changes from C4-T2 fusion.  There was also left disc protrusion at C3-4 where it deforms the left hemi-cord and deflects the cord to the right.  She had a NCV-EMG performed on 12/08/14, which was normal.  A NCV-EMG was performed on 12/08/14, which was normal.   Her wrist still hurts. She is seeing an orthopedist.  PAST MEDICAL HISTORY: Past Medical History:  Diagnosis Date  . Anemia   . Anxiety panic attacks   [name]  . Arthritis  knees  . Cardiomyopathy, nonischemic ([place]) 04/07/2016   EF left ventricule calculated on Lexiscan stress test at 33% with global hypokinesis.   . Chronic back pain   . Chronic neck pain    . Depression   . Dysrhythmia    pvc's with cardiomyopathy  . Family history of adverse reaction to anesthesia    mother has problems waking up  . Fibromyalgia    and neuropathy of feet  . History of kidney stones    lithotripsy  03/2016  . Hyperlipidemia   . Hypertension   . Kidney stone    History  . Kidney stones h/o  . Melanoma ([place]) RUE   [name]  . MGUS (monoclonal gammopathy of unknown significance) 06/2015   no treatment just watching recheck blood work 12/2015  . Migraine    OTC med prn - stress related  . Plantar fasciitis right  . Seasonal allergies   . SVD (spontaneous vaginal delivery)    x 3  . Tendonitis of foot right    MEDICATIONS: Current Outpatient Prescriptions on File Prior to Visit  Medication Sig Dispense Refill  . buPROPion (WELLBUTRIN XL) 150 MG 24 hr tablet Take 150 mg by mouth daily.    . carvedilol (COREG) 25 MG tablet Take 6.25 mg by mouth 2 (two) times daily with a meal.     . cetirizine (ZYRTEC) 10 MG tablet Take 10 mg by mouth daily.    . divalproex (DEPAKOTE ER) 500 MG 24 hr tablet TAKE 1 TABLET (500 MG TOTAL) BY MOUTH DAILY. 30 tablet 5  . FLUoxetine (PROZAC) 40 MG capsule Take 40 mg by mouth daily.     Marland Kitchen lisinopril (PRINIVIL,ZESTRIL) 20 MG tablet Take 10 mg by mouth every evening.     Marland Kitchen LORazepam (ATIVAN) 0.5 MG tablet Take 1-2 tablets (0.5-1 mg total) by mouth 3 (three) times daily as needed for anxiety. Prn anxiety 30 tablet 0  . mirabegron ER (MYRBETRIQ) 50 MG TB24 tablet Take 50 mg by mouth daily.    Marland Kitchen MISC NATURAL PRODUCTS PO Take 2 capsules by mouth 2 (two) times daily. Rejuvenixx    . MISC NATURAL PRODUCTS PO Take 1 capsule by mouth 2 (two) times daily. Optimal- V    . MISC NATURAL PRODUCTS PO Take 1 capsule by mouth 2 (two) times daily. Optimal-M    . MISC NATURAL PRODUCTS PO Take 1 capsule by mouth 2 (two) times daily. MagnaCal-D    . oxyCODONE-acetaminophen (PERCOCET/ROXICET) 5-325 MG tablet Take 0.5 tablets by mouth every 4  (four) hours as needed for severe pain.    . pregabalin (LYRICA) 75 MG capsule Take 1 capsule (75 mg total) by mouth 2 (two) times daily. 180 capsule 3  . tiZANidine (ZANAFLEX) 2 MG tablet Take 2 mg by mouth every 6 (six) hours as needed for muscle spasms.     No current facility-administered medications on file prior to visit.     ALLERGIES: Allergies  Allergen Reactions  . Penicillins Shortness Of Breath and Rash    Has patient had a PCN reaction causing immediate rash, facial/tongue/throat swelling, SOB or lightheadedness with hypotension: no Has patient had a PCN reaction causing severe rash involving mucus membranes or skin necrosis: no Has patient had a PCN reaction that required hospitalization no Has patient had a PCN reaction occurring within the last 10 years: no If all of the above answers are "NO", then may proceed with Cephalosporin use.   . Codeine Other (See Comments)  Manic depressive  . Voltaren [Diclofenac] Nausea And Vomiting and Other (See Comments)    Pt stated "tore stomach up"  . Erythromycin Rash    FAMILY HISTORY: Family History  Problem Relation Age of Onset  . Hypertension Mother   . Depression Mother   . Migraines Mother   . Hypothyroidism Mother   . COPD Father   . Heart failure Father 39  . Breast cancer Cousin     40's  . Cancer Cousin     breast; in her 104's  . Diabetes Neg Hx     SOCIAL HISTORY: Social History   Social History  . Marital status: Married    Spouse name: N/A  . Number of children: N/A  . Years of education: N/A   Occupational History  . Not on file.   Social History Main Topics  . Smoking status: Former Smoker    Packs/day: 0.10    Types: Cigarettes    Quit date: 07/04/1987  . Smokeless tobacco: Never Used  . Alcohol use 0.0 oz/week     Comment: ONCE A MONTH Wine  . Drug use: No  . Sexual activity: Yes    Partners: Male    Birth control/ protection: Other-see comments     Comment: husband had vasectomy    Other Topics Concern  . Not on file   Social History Narrative   Married.  Lives with husband, 1 daughter (in college), son, brother-in-law (with Down's syndrome)--moving into Memory care 02/2016.    1 dog.   1 daughter in [place], [place].   Granddaughter in [place] (with her mother)   Disabled due to anxiety.  Previously worked as Public relations account executive.    REVIEW OF SYSTEMS: Constitutional: No fevers, chills, or sweats, no generalized fatigue, change in appetite Eyes: No visual changes, double vision, eye pain Ear, nose and throat: No hearing loss, ear pain, nasal congestion, sore throat Cardiovascular: No chest pain, palpitations Respiratory:  No shortness of breath at rest or with exertion, wheezes GastrointestinaI: No nausea, vomiting, diarrhea, abdominal pain, fecal incontinence Genitourinary:  No dysuria, urinary retention or frequency Musculoskeletal:  No neck pain, back pain Integumentary: No rash, pruritus, skin lesions Neurological: as above Psychiatric: No depression, insomnia, anxiety Endocrine: No palpitations, fatigue, diaphoresis, mood swings, change in appetite, change in weight, increased thirst Hematologic/Lymphatic:  No purpura, petechiae. Allergic/Immunologic: no itchy/runny eyes, nasal congestion, recent allergic reactions, rashes  PHYSICAL EXAM: Vitals:   08/07/16 1030  BP: 120/74  Pulse: 75   General: No acute distress.  Patient appears well-groomed.  normal body habitus. Head:  Normocephalic/atraumatic Eyes:  Fundi examined but not visualized Neck: supple, no paraspinal tenderness, full range of motion Heart:  Regular rate and rhythm Lungs:  Clear to auscultation bilaterally Back: No paraspinal tenderness Neurological Exam: alert and oriented to person, place, and time. Attention span and concentration intact, recent and remote memory intact, fund of knowledge intact.  Speech fluent and not dysarthric, language intact.  CN II-XII intact. Bulk and  tone normal, muscle strength 4+/5 right ankle dorsiflexion, otherwise 5/5 throughout.  Sensation to light touch  intact.  Deep tendon reflexes 2+ throughout.  Finger to nose testing intact.  Gait with right limp.  IMPRESSION: Migraine.  Increased frequency likely related to stress, back pain and change in weather.  Starting to improve.  Unfortunately, abortive medications are limited.  Triptans and ergotamines contraindicated with cardiomyopathy.  She is on multiple antidepressants, so I do not want to prescribe tramadol due to possible  serotonin syndrome.   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  Continue Depakote ER 500mg  daily 2.  Continue Lyrica 75mg  twice daily (higher doses caused black outs).  May need to consider restarting gabapentin if radicular pain does not improve.  Follows up with Dr. Vertell Limber of neurosurgery. 3.  Will try Lidocaine nasal drops for abortive therapy.  Other option would be ketorolac. 4.  Supplements discussed 5.  Follow up in 6 months or as needed.  Metta Clines, DO  CC: Rita Ohara, MD

## 2016-08-09 ENCOUNTER — Other Ambulatory Visit: Payer: Self-pay | Admitting: Neurology

## 2016-08-12 ENCOUNTER — Other Ambulatory Visit: Payer: Self-pay | Admitting: Family Medicine

## 2016-08-12 DIAGNOSIS — H8110 Benign paroxysmal vertigo, unspecified ear: Secondary | ICD-10-CM

## 2016-08-14 ENCOUNTER — Ambulatory Visit (INDEPENDENT_AMBULATORY_CARE_PROVIDER_SITE_OTHER): Payer: Medicare Other | Admitting: Family Medicine

## 2016-08-14 VITALS — BP 110/74 | HR 64 | Temp 99.0°F | Ht 66.0 in | Wt 142.8 lb

## 2016-08-14 DIAGNOSIS — B9789 Other viral agents as the cause of diseases classified elsewhere: Secondary | ICD-10-CM | POA: Diagnosis not present

## 2016-08-14 DIAGNOSIS — R05 Cough: Secondary | ICD-10-CM | POA: Diagnosis not present

## 2016-08-14 DIAGNOSIS — J069 Acute upper respiratory infection, unspecified: Secondary | ICD-10-CM

## 2016-08-14 DIAGNOSIS — R52 Pain, unspecified: Secondary | ICD-10-CM

## 2016-08-14 DIAGNOSIS — R059 Cough, unspecified: Secondary | ICD-10-CM

## 2016-08-14 DIAGNOSIS — R42 Dizziness and giddiness: Secondary | ICD-10-CM | POA: Diagnosis not present

## 2016-08-14 LAB — POC INFLUENZA A&B (BINAX/QUICKVUE)
Influenza A, POC: NEGATIVE
Influenza B, POC: NEGATIVE

## 2016-08-14 NOTE — Patient Instructions (Addendum)
Drink plenty of water. Continue Mucinex DM You may use a coricidin HBP to help dry up your nasal congestion (they may make many kinds--be sure not to get one that overlaps the ingredients with Mucinex-DM, no guaifenesin or dextromethorphan).  If they ALL overlap ingredients, then don't get coridicin, and just take a claritin or zyrtec  Continue the meclizine as needed for vertigo.  Try doing sinus rinses to help alleviate sinus pain. Use tylenol as needed for pain, headache. (if not in the coricidin product).  Contact us in a few days if symptoms are worsening--especially if persistent discolored mucus, increasing sinus pain, fever. Return if worsening shortness of breath, chest pain, or other new symptoms develop.  Labyrinthitis Introduction Labyrinthitis is an infection of the inner ear. Your inner ear is a fluid-filled system of tubes and canals (labyrinth). Nerve cells in your inner ear send signals for hearing and balance to your brain. When tiny germs (microorganisms) get inside the labyrinth, they harm the cells that send messages to the brain. Labyrinthitis can cause changes in hearing and balance. Most cases of labyrinthitis come on suddenly and they clear up within weeks. If the infection damages parts of the labyrinth, some symptoms may remain (chronic labyrinthitis). What are the causes? Viruses are the most common cause of labyrinthitis. Viruses that spread into the labyrinth are the same viruses that cause other diseases, such as:  Mononucleosis.  Measles.  Flu.  Herpes. Bacteria can also cause labyrinthitis when they spread into the labyrinth from an infection in the brain or the middle ear. Bacteria can cause:  Serous labyrinthitis. This type of labyrinthitis develops when bacteria produce a poison (toxin) that gets inside the labyrinth.  Suppurative labyrinthitis. This type of labyrinthitis develops when bacteria get inside the labyrinth. What increases the  risk? You may be at greater risk for labyrinthitis if you:  Drink a lot of alcohol.  Smoke.  Take certain drugs.  Are not well rested (fatigued).  Are under a lot of stress.  Have allergies.  Recently had a nose or throat infection (upper respiratory infection) or an ear infection. What are the signs or symptoms? Symptoms of labyrinthitis usually start suddenly. The symptoms can be mild or strong and may include:  Dizziness.  Hearing loss.  A feeling that you are moving when you are not (vertigo).  Ringing in the ear (tinnitus).  Nausea and vomiting.  Trouble focusing your eyes. Symptoms of chronic labyrinthitis may include:  Fatigue.  Confusion.  Hearing loss.  Tinnitus.  Poor balance.  Vertigo after sudden head movements. How is this diagnosed? Your health care provider may suspect labyrinthitis if you suddenly get dizzy and lose hearing, especially if you had a recent upper respiratory infection. Your health care provider will perform a physical exam to:  Check your ears for infection.  Test your balance.  Check your eye movement. Your health care provider may do several tests to rule out other causes of your symptoms and to help make a diagnosis of labyrinthitis. These may include:  Imaging studies, such as a CT scan or an MRI, to look for other causes of your symptoms.  Hearing tests.  Electronystagmography (ENG) to check your balance. How is this treated? Treatment of labyrinthitis depends on the cause. If your labyrinthitis is caused by a virus, it may get better without treatment. If your labyrinthitis is caused by bacteria, you may need medicine to fight the infection (antibiotic medicine). You may also have treatment to relieve labyrinthitis symptoms.  Treatments may include:  Medicines to:  Stop dizziness.  Relieve nausea.  Treat the inflamed area.  Speed up your recovery.  Bed rest until dizziness goes away.  Fluids given through an  IV tube. You may need this treatment if you have too little fluid in your body (dehydrated) from repeated nausea and vomiting. Follow these instructions at home:  Take medicines only as directed by your health care provider.  If you were prescribed an antibiotic medicine, finish all of it even if you start to feel better.  Rest as much as possible.  Avoid loud noises and bright lights.  Do not make sudden movements until any dizziness goes away.  Do not drive until your health care provider says that you can.  Drink enough fluid to keep your urine clear or pale yellow.  Work with a physical therapist if you still feel dizzy after several weeks. A therapist can teach you exercises to help you adjust to feeling dizzy (vestibular rehabilitation exercises).  Keep all follow-up visits as directed by your health care provider. This is important. Contact a health care provider if:  Your symptoms are not relieved by medicines.  Your symptoms last longer than two weeks.  You have a fever. Get help right away if:  You become very dizzy.  You have nausea or vomiting that does not go away.  Your hearing gets much worse very quickly. This information is not intended to replace advice given to you by your health care provider. Make sure you discuss any questions you have with your health care provider. Document Released: 07/10/2014 Document Revised: 11/25/2015 Document Reviewed: 02/18/2014  2017 [employer]

## 2016-08-14 NOTE — Telephone Encounter (Signed)
Is this okay to refill? 

## 2016-08-14 NOTE — Progress Notes (Signed)
Chief Complaint  Patient presents with  . Cough    started 08/06/15. Chest and head congestion, sinus pressure, sinus HA's. Body aches, extreme fatigue, chills and fever (highest 100.0). Started Mucinex DM. Thurs am she passed out and fell down, room got black and room was spinning and her ears were ringing. Hit floor with left buttock and left side of head. (on tile floor) Saw Dr Einar Gip Thursday, did EKG. Took meclizine over the weekend. Ears are stopped up. Right is worse than left today, but varies. Throat is raw from drainage, mucus is clear/darkgreen.   Started with congestion 2/3.  Sometimes it is more in her head, other times more in her chest.  She has been taking mucinex. Ears feel plugged, and then vertigo started. Nasal drainage varies throughout the day, sometimes yellow, sometimes clear.  She has a cough, which is productive of phlegm that is sometimes clear, and sometimes dark green.  She gets out of breath, associated with the dizziness, with movements, and also winded with talking.   She had a syncopal episode 2/8--saw Dr. Einar Gip later that day. Felt like she had BPV but also was orthostatic at that time.  She has been drinking more fluids (but always drinks plenty of water, 100 oz).  She feels the orthostatic symptoms have improved, now having more of the vertigo only.  She took meclizine over the weekend, which helps, but doesn't last the full time. She brought in box--has #7 left, and has another box of #16 at home. No refill needed (pharmacy contacted [place] for refill earlier today--not needed).  She feels bruised on her left buttock, but it looks normal (she doesn't note any bruise or swelling).  Her head is still a little sore from hitting the ground. Never pass out completely--always responsive to husband.  Dizziness is with any movement, not any particular position. She is taking mucinex DM.  PMH, PSH, SH reviewed  Outpatient Encounter Prescriptions as of 08/14/2016  Medication  Sig Note  . Biotin 5000 MCG CAPS Take 1 capsule by mouth every evening.   Marland Kitchen buPROPion (WELLBUTRIN XL) 150 MG 24 hr tablet Take 150 mg by mouth daily.   . carvedilol (COREG) 25 MG tablet Take 6.25 mg by mouth 2 (two) times daily with a meal.    . divalproex (DEPAKOTE ER) 500 MG 24 hr tablet TAKE 1 TABLET (500 MG TOTAL) BY MOUTH DAILY.   Marland Kitchen FLUoxetine (PROZAC) 40 MG capsule Take 40 mg by mouth daily.    Marland Kitchen lisinopril (PRINIVIL,ZESTRIL) 20 MG tablet Take 10 mg by mouth every evening.    Marland Kitchen LORazepam (ATIVAN) 0.5 MG tablet Take 1-2 tablets (0.5-1 mg total) by mouth 3 (three) times daily as needed for anxiety. Prn anxiety 07/07/2016: Using 1 tablet 3-4 times daily ([name])  . meclizine (ANTIVERT) 25 MG tablet Take 25 mg by mouth 3 (three) times daily as needed for dizziness.   . mirabegron ER (MYRBETRIQ) 50 MG TB24 tablet Take 50 mg by mouth daily.   Marland Kitchen MISC NATURAL PRODUCT OP Take 2 capsules by mouth daily. Omega-Q   . MISC NATURAL PRODUCTS PO Take 2 capsules by mouth 2 (two) times daily. Rejuvenixx   . MISC NATURAL PRODUCTS PO Take 1 capsule by mouth 2 (two) times daily. Optimal- V   . MISC NATURAL PRODUCTS PO Take 1 capsule by mouth 2 (two) times daily. Optimal-M   . MISC NATURAL PRODUCTS PO Take 1 capsule by mouth 2 (two) times daily. MagnaCal-D   .  oxyCODONE-acetaminophen (PERCOCET/ROXICET) 5-325 MG tablet Take 0.5 tablets by mouth every 4 (four) hours as needed for severe pain. 07/07/2016: Using 1 tablet at night, none during day  . pregabalin (LYRICA) 75 MG capsule Take 1 capsule (75 mg total) by mouth 2 (two) times daily.   . pseudoephedrine-guaifenesin (MUCINEX D) 60-600 MG 12 hr tablet Take 1 tablet by mouth every 12 (twelve) hours. 08/14/2016: Taking mucinex DM (NOT mucinex D)  . Riboflavin 100 MG CAPS Take 2 capsules by mouth daily.   Marland Kitchen tiZANidine (ZANAFLEX) 2 MG tablet Take 2 mg by mouth every 6 (six) hours as needed for muscle spasms. 07/07/2016: Not using much, about 3x/month prn neck pain  .  AMBULATORY NON FORMULARY MEDICATION Lidocaine 4% solution .50 ml. Place 2 drops into both nostrils 4 times daily.  When waking up with headache place 2 gtt in each nostril, lie down on back, with head extended and rotated slightly to R. Not to exceed more than 4 times daily. (Patient not taking: Reported on 08/14/2016)   . lidocaine (XYLOCAINE) 4 % external solution WHEN WAKING WITH HEADACHE USE TWO DROPS IN EACH NOSTRIL. LAY DOWN W/ HEAD EXTENDED ROTATE TO RIGHT. MAX ONE DOSE 4X A DAY. (Patient not taking: Reported on 08/14/2016)   . [DISCONTINUED] cetirizine (ZYRTEC) 10 MG tablet Take 10 mg by mouth daily.   . [DISCONTINUED] dihydroergotamine (MIGRANAL) 4 MG/ML nasal spray 1 spray in each nostril.  May repeat every 15 minutes x2 if needed.  Do not exceed 4 sprays in 24hrs or 8 sprays in 1 week.    No facility-administered encounter medications on file as of 08/14/2016.    Allergies  Allergen Reactions  . Penicillins Shortness Of Breath and Rash    Has patient had a PCN reaction causing immediate rash, facial/tongue/throat swelling, SOB or lightheadedness with hypotension: no Has patient had a PCN reaction causing severe rash involving mucus membranes or skin necrosis: no Has patient had a PCN reaction that required hospitalization no Has patient had a PCN reaction occurring within the last 10 years: no If all of the above answers are "NO", then may proceed with Cephalosporin use.   . Codeine Other (See Comments)    Manic depressive  . Voltaren [Diclofenac] Nausea And Vomiting and Other (See Comments)    Pt stated "tore stomach up"  . Erythromycin Rash   ROS: +low grade fever, chills, myalgias, vertigo. Intermittent ear pain, head congestion, cough, sore throat. Some dyspnea as per HPI, fatigue.  No urinary complaints, vomiting, diarrhea.  PHYSICAL EXAM:  BP 110/74 (BP Location: Right Arm, Patient Position: Sitting, Cuff Size: Normal)   Pulse 64   Temp 99 F (37.2 C)   Ht 5\' 6"  (1.676  m)   Wt 142 lb 12.8 oz (64.8 kg)   LMP 12/16/2012   BMI 23.05 kg/m   Tired appearing female, able to speak easily in full sentences.  She doesn't appear to be in any distress HEENT:  PERRL, EOMI, conjunctiva and sclera are clear. TM's and EAC's normal Nasal mucosa is mildly edematous, no erythema or purulence Diffusely tender across sinses, TMJ's, temples OP is clear, no erythema or lesions Heart: regular rate and rhythm, but frequent ectopy Lungs clear bilaterally Abdomen: soft, nontender Skin: pale, normal turgor, no rash/lesions Neuro: alert and oriented.  Cranial nerves intact. Normal gait  Influenza A&B negative   ASSESSMENT/PLAN:  Viral upper respiratory tract infection - flu-like, with vertigo.  Too late for any treatment. Supportive measures reviewd, and s/sx bacterial  infection. Contact us if sx change  Vertigo - suspect viral labyrinthitis; supportive measures, continue meclizine prn  Cough - Plan: POC Influenza A&B (Binax test)  Body aches - Plan: POC Influenza A&B (Binax test)   Drink plenty of water. Continue Mucinex DM You may use a coricidin HBP to help dry up your nasal congestion (they may make many kinds--be sure not to get one that overlaps the ingredients with Mucinex-DM, no guaifenesin or dextromethorphan).  If they ALL overlap ingredients, then don't get coridicin, and just take a claritin or zyrtec  Continue the meclizine as needed for vertigo.  Try doing sinus rinses to help alleviate sinus pain. Use tylenol as needed for pain, headache. (if not in the coricidin product).  Contact us in a few days if symptoms are worsening--especially if persistent discolored mucus, increasing sinus pain, fever. Return if worsening shortness of breath, chest pain, or other new symptoms develop.

## 2016-08-15 ENCOUNTER — Encounter: Payer: Self-pay | Admitting: Family Medicine

## 2016-08-18 ENCOUNTER — Other Ambulatory Visit: Payer: Self-pay | Admitting: Neurology

## 2016-08-19 ENCOUNTER — Telehealth: Payer: Self-pay | Admitting: Family Medicine

## 2016-08-19 NOTE — Telephone Encounter (Signed)
Recv'd approval for PT ASSISTANCE PROGRAM FOR [facility] til 07/02/17.  Lyrica #[id number] was shipped to pt's address

## 2016-08-20 ENCOUNTER — Encounter: Payer: Self-pay | Admitting: Family Medicine

## 2016-09-15 ENCOUNTER — Telehealth: Payer: Self-pay

## 2016-09-15 NOTE — Telephone Encounter (Signed)
Form placed in your folder for completion to allow pt to participate at the [facility]. Please call [phone] when ready. [name]

## 2016-09-20 ENCOUNTER — Telehealth: Payer: Self-pay

## 2016-09-20 NOTE — Telephone Encounter (Signed)
LM for pt to CB in regards to form completed by Dr. Tomi Bamberger

## 2016-09-23 ENCOUNTER — Encounter: Payer: Self-pay | Admitting: Family Medicine

## 2016-10-19 ENCOUNTER — Encounter: Payer: Self-pay | Admitting: Family Medicine

## 2016-10-27 ENCOUNTER — Ambulatory Visit (INDEPENDENT_AMBULATORY_CARE_PROVIDER_SITE_OTHER): Payer: Medicare Other | Admitting: Rheumatology

## 2016-10-27 ENCOUNTER — Encounter: Payer: Self-pay | Admitting: Rheumatology

## 2016-10-27 VITALS — BP 107/44 | HR 72 | Resp 14 | Ht 66.0 in | Wt 152.0 lb

## 2016-10-27 DIAGNOSIS — I428 Other cardiomyopathies: Secondary | ICD-10-CM | POA: Diagnosis not present

## 2016-10-27 DIAGNOSIS — D472 Monoclonal gammopathy: Secondary | ICD-10-CM

## 2016-10-27 DIAGNOSIS — M47816 Spondylosis without myelopathy or radiculopathy, lumbar region: Secondary | ICD-10-CM | POA: Diagnosis not present

## 2016-10-27 DIAGNOSIS — I10 Essential (primary) hypertension: Secondary | ICD-10-CM | POA: Diagnosis not present

## 2016-10-27 DIAGNOSIS — N29 Other disorders of kidney and ureter in diseases classified elsewhere: Secondary | ICD-10-CM

## 2016-10-27 DIAGNOSIS — M170 Bilateral primary osteoarthritis of knee: Secondary | ICD-10-CM

## 2016-10-27 DIAGNOSIS — G629 Polyneuropathy, unspecified: Secondary | ICD-10-CM | POA: Diagnosis not present

## 2016-10-27 DIAGNOSIS — C4361 Malignant melanoma of right upper limb, including shoulder: Secondary | ICD-10-CM | POA: Diagnosis not present

## 2016-10-27 DIAGNOSIS — M1120 Other chondrocalcinosis, unspecified site: Secondary | ICD-10-CM

## 2016-10-27 DIAGNOSIS — M5030 Other cervical disc degeneration, unspecified cervical region: Secondary | ICD-10-CM

## 2016-10-27 DIAGNOSIS — M722 Plantar fascial fibromatosis: Secondary | ICD-10-CM | POA: Diagnosis not present

## 2016-10-27 DIAGNOSIS — M797 Fibromyalgia: Secondary | ICD-10-CM

## 2016-10-27 DIAGNOSIS — M47812 Spondylosis without myelopathy or radiculopathy, cervical region: Secondary | ICD-10-CM

## 2016-10-27 NOTE — Progress Notes (Signed)
Office Visit Note  Patient: Lindsey Rodriguez             Date of Birth: 05-Aug-1965           MRN: [id number]             PCP: [name], MD Referring: Rita Ohara, MD Visit Date: 10/27/2016 Occupation: @GUAROCC @    Subjective:  Knee Pain (BIL); Hand Pain (BIL); Hip Pain (BIL); Elbow Pain (BIL); and Shoulder Pain (BIL)   History of Present Illness: Lindsey Rodriguez is a 51 y.o. female with a history of chondrocalcinosis, OA, DJD, and fibromyalgia.  Patient states she has been having a flare for the past 2 weeks, which has progressively been getting worse.  She states she is having pain in both knees, both hands, both hips, both elbows, and both shoulders.  Patient states her pain is most severe in her left knee and ranks it 9/10 pain.  Her shoulder and hip pain is 7/10.  She states she thinks it is due to the weather changes.  She states she is worried this is all due to arthritis.  She reports she has tried icing her joints, resting, and using ibuprofen with no relief.  She thinks she has swelling in both knees and bilateral thumbs.  She reports she becomes very stiff if she sits in the same place for more than 20 minutes and especially in the morning upon awakening. She states she has also been experiencing increased fatigue and insomnia.   Activities of Daily Living:  Patient reports morning stiffness for 2  hours.   Patient Reports nocturnal pain.  Difficulty dressing/grooming: Reports Difficulty climbing stairs: Reports Difficulty getting out of chair: Reports Difficulty using hands for taps, buttons, cutlery, and/or writing: Reports   Review of Systems  Constitutional: Positive for fatigue. Negative for night sweats, weight gain, weight loss and weakness.  HENT: Positive for mouth sores and mouth dryness. Negative for trouble swallowing, trouble swallowing and nose dryness.   Eyes: Positive for dryness. Negative for pain, redness and visual disturbance.  Respiratory:  Negative for cough, shortness of breath and difficulty breathing.   Cardiovascular: Positive for palpitations. Negative for chest pain, hypertension, irregular heartbeat and swelling in legs/feet.  Gastrointestinal: Negative for blood in stool, constipation, diarrhea and vomiting.  Endocrine: Negative for increased urination.  Genitourinary: Negative for painful urination and vaginal dryness.  Musculoskeletal: Positive for arthralgias, joint pain, muscle weakness and morning stiffness. Negative for joint swelling, myalgias, muscle tenderness and myalgias.  Skin: Negative for color change, rash, hair loss, skin tightness, ulcers and sensitivity to sunlight.  Allergic/Immunologic: Negative for susceptible to infections.  Neurological: Negative for dizziness, memory loss and night sweats.  Hematological: Negative for swollen glands.  Psychiatric/Behavioral: Negative for depressed mood and sleep disturbance. The patient is not nervous/anxious.     PMFS History:  Patient Active Problem List   Diagnosis Date Noted  . Lumbar scoliosis 06/15/2016  . Chondrocalcinosis 05/15/2016  . Primary osteoarthritis of both knees 05/15/2016  . Tendinopathy of right shoulder 05/15/2016  . Spondylosis of lumbar region 05/15/2016  . Plantar fasciitis, bilateral 05/15/2016  . Renal calcinosis 05/15/2016  . Malignant melanoma of right upper extremity including shoulder ([place]) 05/15/2016  . Nonischemic cardiomyopathy ([place]) 04/10/2016  . Post op infection 12/18/2015  . Postoperative state 11/24/2015  . Chronic migraine without aura without status migrainosus, not intractable 08/04/2015  . Fibromyalgia 07/26/2015  . MGUS (monoclonal gammopathy of unknown significance) 06/10/2015  . DJD (degenerative  joint disease), cervical 03/02/2015  . Migraine headache 09/05/2012  . Fatigue 10/26/2011  . Pure hyperglyceridemia 04/27/2011  . Essential hypertension, benign 04/17/2011  . Vitamin D deficiency 04/17/2011  .  Neuropathy 12/22/2010  . Headache 12/22/2010    Past Medical History:  Diagnosis Date  . Anemia   . Anxiety panic attacks   [name]  . Arthritis    knees  . Cardiomyopathy, nonischemic ([place]) 04/07/2016   EF left ventricule calculated on Lexiscan stress test at 33% with global hypokinesis.   . Chronic back pain   . Chronic neck pain   . Depression   . Dysrhythmia    pvc's with cardiomyopathy  . Family history of adverse reaction to anesthesia    mother has problems waking up  . Fibromyalgia    and neuropathy of feet  . History of kidney stones    lithotripsy  03/2016  . Hyperlipidemia   . Hypertension   . Kidney stone    History  . Kidney stones h/o  . Melanoma ([place]) RUE   [name]  . MGUS (monoclonal gammopathy of unknown significance) 06/2015   no treatment just watching recheck blood work 12/2015  . Migraine    OTC med prn - stress related  . Plantar fasciitis right  . Seasonal allergies   . SVD (spontaneous vaginal delivery)    x 3  . Tendonitis of foot right    Family History  Problem Relation Age of Onset  . Hypertension Mother   . Depression Mother   . Migraines Mother   . Hypothyroidism Mother   . COPD Father   . Heart failure Father 61  . Breast cancer Cousin     40's  . Cancer Cousin     breast; in her 73's  . Diabetes Neg Hx    Past Surgical History:  Procedure Laterality Date  . ABDOMINAL HYSTERECTOMY    . ABLATION ON ENDOMETRIOSIS N/A 11/24/2015   Procedure: ABLATION ON ENDOMETRIOSIS;  Surgeon: [name], MD;  Location: [facility];  Service: Gynecology;  Laterality: N/A;  . ANTERIOR LAT LUMBAR FUSION Right 06/15/2016   Procedure: RIGHT LUMBAR THREE-FOUR ANTEROLATERAL LUMBAR INTERBODY FUSION WITH LATERAL PLATE;  Surgeon: Erline Levine, MD;  Location: [facility];  Service: Neurosurgery;  Laterality: Right;  . BACK SURGERY  Dr.Stern, last sx Summer 2011   X 4  (L3-S1)  . BLADDER SUSPENSION N/A 11/24/2015   Procedure: TRANSVAGINAL TAPE (TVT)  PROCEDURE;  Surgeon: [name], MD;  Location: [facility];  Service: Gynecology;  Laterality: N/A;  . COLONOSCOPY    . CYSTOCELE REPAIR  11/24/2015   Procedure: ANTERIOR REPAIR (CYSTOCELE);  Surgeon: [name], MD;  Location: [facility];  Service: Gynecology;;  . Consuela Mimes N/A 11/24/2015   Procedure: CYSTOSCOPY;  Surgeon: [name], MD;  Location: [facility];  Service: Gynecology;  Laterality: N/A;  . DILATION AND CURETTAGE OF UTERUS    . ENDOMETRIAL ABLATION  08/2000  . EYE SURGERY Bilateral    Lasik   . KIDNEY STONE SURGERY  2011  . MELANOMA EXCISION  05/2015   right forearm; [name]  . NECK SURGERY  [name]  Summer 2011   X 4  (C4-T1 Level)  . RHINOPLASTY    . ROBOTIC ASSISTED BILATERAL SALPINGO OOPHERECTOMY Bilateral 11/24/2015   Procedure: ROBOTIC ASSISTED BILATERAL SALPINGO OOPHORECTOMY;  Surgeon: [name], MD;  Location: [facility];  Service: Gynecology;  Laterality: Bilateral;  . ROBOTIC ASSISTED TOTAL HYSTERECTOMY WITH SALPINGECTOMY Bilateral 11/24/2015   Procedure: ROBOTIC  ASSISTED TOTAL HYSTERECTOMY WITH SALPINGECTOMY;  Surgeon: [name], MD;  Location: [facility];  Service: Gynecology;  Laterality: Bilateral;  . TONSILLECTOMY    . WISDOM TOOTH EXTRACTION     Social History   Social History Narrative   Married.  Lives with husband, 1 daughter (in college), son, brother-in-law (with Down's syndrome)--moving into Memory care 02/2016.    1 dog.   1 daughter in [place], [place].   Granddaughter in [place] (with her mother)   Disabled due to anxiety.  Previously worked as Public relations account executive.     Objective: Vital Signs: BP (!) 107/44 (BP Location: Left Arm, Patient Position: Sitting, Cuff Size: Normal)   Pulse 72   Resp 14   Ht 5\' 6"  (1.676 m)   Wt 152 lb (68.9 kg)   LMP 12/16/2012   BMI 24.53 kg/m    Physical Exam  Constitutional: She is oriented to person, place, and time. She appears well-developed and well-nourished.  HENT:  Head: Normocephalic  and atraumatic.  Eyes: Conjunctivae and EOM are normal.  Neck: Normal range of motion.  Cardiovascular: Normal rate, regular rhythm, normal heart sounds and intact distal pulses.   Pulmonary/Chest: Effort normal and breath sounds normal.  Abdominal: Soft. Bowel sounds are normal.  Lymphadenopathy:    She has no cervical adenopathy.  Neurological: She is alert and oriented to person, place, and time.  Skin: Skin is warm and dry. Capillary refill takes less than 2 seconds.  Psychiatric: She has a normal mood and affect. Her behavior is normal.  Nursing note and vitals reviewed.    Musculoskeletal Exam: C-spine and thoracic lumbar spine good range of motion. Shoulder joints elbow joints wrist joint MCPs PIPs DIPs were good range of motion with no synovitis. Hip joints knee joints ankles MTPs PIPs DIPs with good range of motion with no synovitis. She had discomfort in left knee joint but no warmth swelling or effusion was noted. Fibromyalgia tender points were 18 out of 18 positive she also had generalized hyperalgesia.  CDAI Exam: No CDAI exam completed.    Investigation: No additional findings.   Imaging: No results found.  Speciality Comments: No specialty comments available.    Procedures:  No procedures performed Allergies: Penicillins; Codeine; Voltaren [diclofenac]; and Erythromycin   Assessment / Plan:     Visit Diagnoses: Chondrocalcinosis: She does not appear to be having a flare of pseudogout. She has no inflamed joints on examination today.  Primary osteoarthritis of both knees: She does have underlying moderate osteoarthritis of her knee joints. She's complaining of increased pain in her left knee joint. We discussed possible cortisone injection but discouraged as she had no inflammation.  DJD (degenerative joint disease), cervical - s/p C-spine fusion and discectomy by Dr. Vertell Limber  Spondylosis of lumbar region: Chronic pain  Plantar fasciitis, bilateral: Currently  not bothersome  Fibromyalgia: She is having a flare of fibromyalgia with generalized pain and positive tender points and fatigue. We had detailed discussion regarding that. I reviewed her medications. She's currently on Lyrica, and Wellbutrin along with Prozac. She may benefit from Cymbalta I will advised to discuss that further with her psychiatrist. She is also not taking tizanidine which can help her with the muscle spasm and discomfort. I've encouraged her to restart that medication.  MGUS (monoclonal gammopathy of unknown significance): Followed up by hematology  Her other medical problems are as follows for which she's been seen by other physicians:  Malignant melanoma of right upper extremity including shoulder ([facility])  Renal calcinosis  Nonischemic cardiomyopathy ([facility])  Essential hypertension, benign: Her blood pressure is well controlled.  Neuropathy    Orders: No orders of the defined types were placed in this encounter.  No orders of the defined types were placed in this encounter.   Face-to-face time spent with patient was 30 minutes. 50% of time was spent in counseling and coordination of care.  Follow-Up Instructions: Return in about 6 months (around 04/28/2017) for FMS, OA,CPPD.   Bo Merino, MD  Note - This record has been created using Editor, commissioning.  Chart creation errors have been sought, but may not always  have been located. Such creation errors do not reflect on  the standard of medical care.

## 2016-12-12 ENCOUNTER — Encounter: Payer: Self-pay | Admitting: Hematology

## 2016-12-12 ENCOUNTER — Ambulatory Visit (HOSPITAL_BASED_OUTPATIENT_CLINIC_OR_DEPARTMENT_OTHER): Payer: Medicare Other | Admitting: Hematology

## 2016-12-12 ENCOUNTER — Other Ambulatory Visit (HOSPITAL_BASED_OUTPATIENT_CLINIC_OR_DEPARTMENT_OTHER): Payer: Medicare Other

## 2016-12-12 ENCOUNTER — Telehealth: Payer: Self-pay | Admitting: Hematology

## 2016-12-12 VITALS — BP 109/52 | HR 62 | Temp 98.6°F | Resp 18 | Wt 154.2 lb

## 2016-12-12 DIAGNOSIS — D472 Monoclonal gammopathy: Secondary | ICD-10-CM | POA: Diagnosis not present

## 2016-12-12 LAB — COMPREHENSIVE METABOLIC PANEL
ALT: 15 U/L (ref 0–55)
AST: 19 U/L (ref 5–34)
Albumin: 4.3 g/dL (ref 3.5–5.0)
Alkaline Phosphatase: 66 U/L (ref 40–150)
Anion Gap: 6 mEq/L (ref 3–11)
BUN: 19.8 mg/dL (ref 7.0–26.0)
CO2: 28 mEq/L (ref 22–29)
Calcium: 9.8 mg/dL (ref 8.4–10.4)
Chloride: 103 mEq/L (ref 98–109)
Creatinine: 0.8 mg/dL (ref 0.6–1.1)
EGFR: >90 mL/min/{1.73_m2} (ref >90)
Glucose: 80 mg/dl (ref 70–140)
Potassium: 4.4 mEq/L (ref 3.5–5.1)
Sodium: 137 mEq/L (ref 136–145)
Total Bilirubin: 0.36 mg/dL (ref 0.20–1.20)
Total Protein: 7.5 g/dL (ref 6.4–8.3)

## 2016-12-12 LAB — CBC & DIFF AND RETIC
BASO%: 0.6 % (ref 0.0–2.0)
Basophils Absolute: 0 10*3/uL (ref 0.0–0.1)
EOS%: 2.6 % (ref 0.0–7.0)
Eosinophils Absolute: 0.2 10*3/uL (ref 0.0–0.5)
HCT: 38.4 % (ref 34.8–46.6)
HGB: 12.7 g/dL (ref 11.6–15.9)
Immature Retic Fract: 6.6 % (ref 1.60–10.00)
LYMPH%: 21.9 % (ref 14.0–49.7)
MCH: 33 pg (ref 25.1–34.0)
MCHC: 33.1 g/dL (ref 31.5–36.0)
MCV: 99.7 fL (ref 79.5–101.0)
MONO#: 0.4 10*3/uL (ref 0.1–0.9)
MONO%: 5.8 % (ref 0.0–14.0)
NEUT#: 4.4 10*3/uL (ref 1.5–6.5)
NEUT%: 69.1 % (ref 38.4–76.8)
Platelets: 245 10*3/uL (ref 145–400)
RBC: 3.85 10*6/uL (ref 3.70–5.45)
RDW: 13 % (ref 11.2–14.5)
Retic %: 0.89 % (ref 0.70–2.10)
Retic Ct Abs: 34.27 10*3/uL (ref 33.70–90.70)
WBC: 6.4 10*3/uL (ref 3.9–10.3)
lymph#: 1.4 10*3/uL (ref 0.9–3.3)

## 2016-12-12 NOTE — Progress Notes (Signed)
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 Skeletal survey negative. Bone marrow deferred as per patient choice.  Current Treatment: observation   INTERVAL HISTORY:  Mrs. Bancroft  Is here for her scheduled 6 month follow-up for IgG lambda MGUS. She reports no new bone pains. No new fatigue. No new anemia, hypercalcemia or change in kidney function.  She reports that she did well with her right L3-L4 anterolateral lumbar interbody fusion with lateral plate by Dr. Vertell Limber on 06/15/2016 after cardiology clearance. She notes that her recent echocardiogram showed improvement in her ejection fraction from 33% to 45% on ACE inhibitor's and beta blockers.  Notes no other acute new symptoms at this time. Has been feeling well overall.  REVIEW OF SYSTEMS:    10 Point review of systems of done and is negative except as noted above.  . Past Medical History:  Diagnosis Date  . Anemia   . Anxiety panic attacks   [name]  . Arthritis    knees  . Cardiomyopathy, nonischemic ([place]) 04/07/2016   EF left ventricule calculated on Lexiscan stress test at 33% with global hypokinesis.   . Chronic back pain   . Chronic neck pain   . Depression   . Dysrhythmia    pvc's with cardiomyopathy  . Family history of adverse reaction to anesthesia    mother has problems waking up  . Fibromyalgia    and neuropathy of feet  . History of kidney stones    lithotripsy  03/2016  . Hyperlipidemia   . Hypertension   . Kidney stone    History  . Kidney stones h/o  . Melanoma ([place]) RUE   [name]  . MGUS (monoclonal gammopathy of unknown significance) 06/2015   no treatment just watching recheck blood work  12/2015  . Migraine    OTC med prn - stress related  . Plantar fasciitis right  . Seasonal allergies   . SVD (spontaneous vaginal delivery)    x 3  . Tendonitis of foot right    . Past Surgical History:  Procedure Laterality Date  . ABDOMINAL HYSTERECTOMY    . ABLATION ON ENDOMETRIOSIS N/A 11/24/2015   Procedure: ABLATION ON ENDOMETRIOSIS;  Surgeon: [name], MD;  Location: [facility];  Service: Gynecology;  Laterality: N/A;  . ANTERIOR LAT LUMBAR FUSION Right 06/15/2016   Procedure: RIGHT LUMBAR THREE-FOUR ANTEROLATERAL LUMBAR INTERBODY FUSION WITH LATERAL PLATE;  Surgeon: Erline Levine, MD;  Location: [facility];  Service: Neurosurgery;  Laterality: Right;  . BACK SURGERY  Dr.Stern, last sx Summer 2011   X 4  (L3-S1)  . BACK SURGERY  06/2016  . BLADDER SUSPENSION N/A 11/24/2015   Procedure: TRANSVAGINAL TAPE (TVT) PROCEDURE;  Surgeon: [name], MD;  Location: [facility];  Service: Gynecology;  Laterality: N/A;  . COLONOSCOPY    . CYSTOCELE REPAIR  11/24/2015   Procedure: ANTERIOR REPAIR (CYSTOCELE);  Surgeon: [name], MD;  Location: [facility];  Service: Gynecology;;  . Consuela Mimes N/A 11/24/2015   Procedure: CYSTOSCOPY;  Surgeon: [name], MD;  Location: [facility];  Service: Gynecology;  Laterality: N/A;  . DILATION AND CURETTAGE OF UTERUS    . ENDOMETRIAL ABLATION  08/2000  . EYE SURGERY Bilateral  [place] [facility]  2011  . MELANOMA EXCISION  05/2015   right forearm; [name]  . NECK SURGERY  [name]  Summer 2011   X 4  (C4-T1 Level)  . RHINOPLASTY    . ROBOTIC ASSISTED BILATERAL SALPINGO OOPHERECTOMY Bilateral 11/24/2015   Procedure: ROBOTIC ASSISTED BILATERAL SALPINGO OOPHORECTOMY;  Surgeon: [name], MD;  Location: [facility];  Service: Gynecology;  Laterality: Bilateral;  . ROBOTIC ASSISTED TOTAL HYSTERECTOMY WITH SALPINGECTOMY Bilateral 11/24/2015   Procedure: ROBOTIC ASSISTED TOTAL HYSTERECTOMY WITH SALPINGECTOMY;  Surgeon: [name],  MD;  Location: [facility];  Service: Gynecology;  Laterality: Bilateral;  . TONSILLECTOMY    . WISDOM TOOTH EXTRACTION      . Social History  Substance Use Topics  . Smoking status: Former Smoker    Packs/day: 0.10    Years: 3.00    Types: Cigarettes    Quit date: 07/04/1987  . Smokeless tobacco: Never Used  . Alcohol use 0.0 oz/week     Comment: ONCE A MONTH Wine    ALLERGIES:  is allergic to penicillins; codeine; voltaren [diclofenac]; and erythromycin.  MEDICATIONS:  Current Outpatient Prescriptions  Medication Sig Dispense Refill  . cetirizine (ZYRTEC) 10 MG tablet Take 10 mg by mouth daily.    . Biotin 5000 MCG CAPS Take 1 capsule by mouth every evening.    Marland Kitchen buPROPion (WELLBUTRIN XL) 150 MG 24 hr tablet Take 150 mg by mouth daily.    . carvedilol (COREG) 6.25 MG tablet TAKE 1 (ONE) TABLET BY MOUTH TWO TIMES DAILY AS DIRECTEED  1  . divalproex (DEPAKOTE ER) 500 MG 24 hr tablet TAKE 1 TABLET (500 MG TOTAL) BY MOUTH DAILY. 30 tablet 5  . FLUoxetine (PROZAC) 40 MG capsule Take 40 mg by mouth daily.     Marland Kitchen lidocaine (XYLOCAINE) 4 % external solution WHEN WAKING WITH HEADACHE USE TWO DROPS IN EACH NOSTRIL. LAY DOWN W/ HEAD EXTENDED ROTATE TO RIGHT. MAX ONE DOSE 4X A DAY. (Patient not taking: Reported on 10/27/2016) 50 mL 0  . lisinopril (PRINIVIL,ZESTRIL) 20 MG tablet Take 10 mg by mouth every evening.     Marland Kitchen LORazepam (ATIVAN) 0.5 MG tablet Take 1-2 tablets (0.5-1 mg total) by mouth 3 (three) times daily as needed for anxiety. Prn anxiety 30 tablet 0  . meclizine (ANTIVERT) 25 MG tablet Take 25 mg by mouth 3 (three) times daily as needed for dizziness.    . mirabegron ER (MYRBETRIQ) 50 MG TB24 tablet Take 50 mg by mouth daily.    Marland Kitchen MISC NATURAL PRODUCT OP Take 2 capsules by mouth daily. Omega-Q    . MISC NATURAL PRODUCTS PO Take 2 capsules by mouth 2 (two) times daily. Rejuvenixx    . MISC NATURAL PRODUCTS PO Take 1 capsule by mouth 2 (two) times daily. Optimal- V    . MISC NATURAL  PRODUCTS PO Take 1 capsule by mouth 2 (two) times daily. Optimal-M    . MISC NATURAL PRODUCTS PO Take 1 capsule by mouth 2 (two) times daily. MagnaCal-D    . pregabalin (LYRICA) 75 MG capsule Take 1 capsule (75 mg total) by mouth 2 (two) times daily. 180 capsule 3  . Riboflavin 100 MG CAPS Take 2 capsules by mouth daily.    Marland Kitchen tiZANidine (ZANAFLEX) 2 MG tablet Take 2 mg by mouth every 6 (six) hours as needed for muscle spasms.     No current facility-administered medications for this visit.     PHYSICAL EXAMINATION: ECOG PERFORMANCE  STATUS: 2  . Vitals:   12/12/16 1025  BP: (!) 109/52  Pulse: 62  Resp: 18  Temp: 98.6 F (37 C)    Filed Weights   12/12/16 1025  Weight: 154 lb 3 oz (69.9 kg)   .Body mass index is 24.89 kg/m.  GENERAL:alert, in no acute distress and comfortable SKIN: skin color, texture, turgor are normal, no rashes or significant lesions EYES: normal, conjunctiva are pink and non-injected, sclera clear OROPHARYNX:no exudate, no erythema and lips, buccal mucosa, and tongue normal  NECK: supple, no JVD, thyroid normal size, non-tender, without nodularity LYMPH:  no palpable lymphadenopathy in the cervical, axillary or inguinal LUNGS: clear to auscultation with normal respiratory effort HEART: regular rate & rhythm,  no murmurs and no lower extremity edema ABDOMEN: abdomen soft, non-tender, normoactive bowel sounds  Musculoskeletal: no cyanosis of digits and no clubbing  PSYCH: alert & oriented x 3 with fluent speech NEURO: no focal motor/sensory deficits  LABORATORY DATA:   I have reviewed the data as listed  . CBC Latest Ref Rng & Units 12/12/2016 06/23/2016 06/13/2016  WBC 3.9 - 10.3 10e3/uL 6.4 11.1(H) 9.9  Hemoglobin 11.6 - 15.9 g/dL 12.7 13.4 13.7  Hematocrit 34.8 - 46.6 % 38.4 39.5 40.7  Platelets 145 - 400 10e3/uL 245 401(H) 349    . CMP Latest Ref Rng & Units 06/23/2016 06/13/2016 06/13/2016  Glucose 65 - 99 mg/dL 95 72 -  BUN 6 - 20 mg/dL  13 18.4 -  Creatinine 0.44 - 1.00 mg/dL 0.69 0.7 -  Sodium 135 - 145 mmol/L 138 138 -  Potassium 3.5 - 5.1 mmol/L 3.9 4.4 -  Chloride 101 - 111 mmol/L 102 - -  CO2 22 - 32 mmol/L 25 27 -  Calcium 8.9 - 10.3 mg/dL 9.9 10.1 -  Total Protein 6.5 - 8.1 g/dL 7.2 7.8 7.2  Total Bilirubin 0.3 - 1.2 mg/dL 0.8 0.29 -  Alkaline Phos 38 - 126 U/L 64 73 -  AST 15 - 41 U/L 41 24 -  ALT 14 - 54 U/L 31 30 -      RADIOGRAPHIC STUDIES: I have personally reviewed the radiological images as listed and agreed with the findings in the report. No results found.  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 Previous UPEP and skeletal survey negative. Patient has no evidence of anemia, renal failure on labs. No hypercalcemia. No new bone pain. previous SPEP showed stable M-spike at 0.8g/dl with no significant increase over the last couple of follow-ups. Labs today show stable hemoglobin level and no hypercalcemia or renal insufficiency. Plan -No evidence of progression to multiple myeloma at this time. -We shall follow up on her myeloma panel results as they become available. -Return to clinic in 6 months with Dr. Irene Limbo with repeat labs. If there is no significant change in her M spike might change to once yearly follow-up at that time.  2) Systolic nonischemic CHF patient notes improvement in her EF from 33% to 45% . 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 Plan -Continue management per dermatology and ongoing skin surveillance. -continue sun protection.  Continue followup with primary care physician, rheumatology, cardiology  RTC with Dr Irene Limbo in 2month with cbc, cmp and SPEP unless Cardiology  has concerns  for cardiac AL amyloidosis as a cause for systolic CHF  I spent 15 minutes counseling the patient face to face. The total time spent in the appointment was 20 minutes and more than 50% was on counseling and direct patient cares.    Sullivan Lone MD [place] AAHIVMS [facility] Hematology/Oncology Physician [facility]  (Office):       [phone] (Work cell):  [phone] (Fax):           [phone]

## 2016-12-12 NOTE — Patient Instructions (Signed)
Thank you for choosing [facility] to provide your oncology and hematology care.  To afford each patient quality time with our providers, please arrive 30 minutes before your scheduled appointment time.  If you arrive late for your appointment, you may be asked to reschedule.  We strive to give you quality time with our providers, and arriving late affects you and other patients whose appointments are after yours.  If you are a no show for multiple scheduled visits, you may be dismissed from the clinic at the providers discretion.   Again, thank you for choosing [facility], our hope is that these requests will decrease the amount of time that you wait before being seen by our physicians.  ______________________________________________________________________ Should you have questions after your visit to the [facility], please contact our office at [phone] between the hours of 8:30 and 4:30 p.m.    Voicemails left after 4:30p.m will not be returned until the following business day.   For prescription refill requests, please have your pharmacy contact us directly.  Please also try to allow 48 hours for prescription requests.   Please contact the scheduling department for questions regarding scheduling.  For scheduling of procedures such as PET scans, CT scans, MRI, Ultrasound, etc please contact central scheduling at [phone].   Resources For Cancer Patients and Caregivers:  American Cancer Society:  800-227-2345  Can help patients locate various types of support and financial assistance Cancer Care: 1-800-813-HOPE (4673) Provides financial assistance, online support groups, medication/co-pay assistance.   [place] DSS:  336-641-3447 Where to apply for food stamps, Medicaid, and utility assistance Medicare Rights Center: 800-333-4114 Helps people with Medicare understand their rights and benefits, navigate the Medicare system, and secure the  quality healthcare they deserve SCAT: 336-333-6589 [place] [employer]'s shared-ride transportation service for eligible riders who have a disability that prevents them from riding the fixed route bus.   For additional information on assistance programs please contact our social worker:   Grier Hock/Abigail Elmore:  [phone] 

## 2016-12-12 NOTE — Telephone Encounter (Signed)
Scheduled appt per 6/12 los - Gave patient AVS and calender per 6/12 - lab and f/u in 6 months.

## 2016-12-14 LAB — MULTIPLE MYELOMA PANEL, SERUM
Albumin SerPl Elph-Mcnc: 4.2 g/dL (ref 2.9–4.4)
Albumin/Glob SerPl: 1.6 (ref 0.7–1.7)
Alpha 1: 0.1 g/dL (ref 0.0–0.4)
Alpha2 Glob SerPl Elph-Mcnc: 0.5 g/dL (ref 0.4–1.0)
B-Globulin SerPl Elph-Mcnc: 0.9 g/dL (ref 0.7–1.3)
Gamma Glob SerPl Elph-Mcnc: 1.3 g/dL (ref 0.4–1.8)
Globulin, Total: 2.8 g/dL (ref 2.2–3.9)
IgA, Qn, Serum: 91 mg/dL (ref 87–352)
IgG, Qn, Serum: 1262 mg/dL (ref 700–1600)
IgM, Qn, Serum: 54 mg/dL (ref 26–217)
M Protein SerPl Elph-Mcnc: 0.9 g/dL — ABNORMAL HIGH
Total Protein: 7 g/dL (ref 6.0–8.5)

## 2017-01-19 ENCOUNTER — Other Ambulatory Visit: Payer: Self-pay

## 2017-01-19 DIAGNOSIS — G629 Polyneuropathy, unspecified: Secondary | ICD-10-CM

## 2017-01-19 NOTE — Telephone Encounter (Signed)
[facility] for faxed rx for 6 months. Okay for samples, if available. I hope somebody told her the office would be closing early and not likely to be done today

## 2017-01-19 NOTE — Telephone Encounter (Signed)
Pt states that she needs help with the refill of her Lyrica for pfizer. She has financial assistance.  Janett Billow at [employer] told pt that they did a reboot on July 2nd, and that her prescription is expired (only good for 6 months).  Pt questions if we can fax a paper script [phone]. She has been out of the office for 1 week, questions if we can dispense samples.   CB # [id number].   [name]

## 2017-01-22 MED ORDER — PREGABALIN 75 MG PO CAPS: 75.0000 mg | ORAL_CAPSULE | Freq: Two times a day (BID) | ORAL | 0 refills | Status: DC

## 2017-01-22 MED ORDER — PREGABALIN 75 MG PO CAPS: 75.0000 mg | ORAL_CAPSULE | Freq: Two times a day (BID) | ORAL | 1 refills | Status: DC

## 2017-01-22 NOTE — Telephone Encounter (Signed)
*  CORRECTION* to original entry of " she has been out of the office for 1 week."   Pt has been out of medication for 1 week and was made aware that Dr. Tomi Bamberger is out of the office until Monday. [name]

## 2017-01-22 NOTE — Telephone Encounter (Signed)
Pt was also notified that samples are ready for pickup

## 2017-01-22 NOTE — Telephone Encounter (Signed)
Gave samples of Lyric for pt and also faxed over rx to [place] for pt assistance. Mickel Baas (front Desk) was notified about patient assistance for this.

## 2017-02-05 ENCOUNTER — Ambulatory Visit: Payer: Medicare Other | Admitting: Neurology

## 2017-02-06 ENCOUNTER — Encounter: Payer: Self-pay | Admitting: Family Medicine

## 2017-02-18 ENCOUNTER — Other Ambulatory Visit: Payer: Self-pay | Admitting: Neurology

## 2017-02-25 ENCOUNTER — Telehealth: Payer: Self-pay | Admitting: Family Medicine

## 2017-02-25 NOTE — Telephone Encounter (Signed)
Recv'd fax from [place] stating Lyrica 180 was shipped to pt's home address

## 2017-02-27 ENCOUNTER — Ambulatory Visit: Payer: Medicare Other | Admitting: Neurology

## 2017-03-02 ENCOUNTER — Encounter: Payer: Self-pay | Admitting: Neurology

## 2017-03-02 ENCOUNTER — Ambulatory Visit (INDEPENDENT_AMBULATORY_CARE_PROVIDER_SITE_OTHER): Payer: Medicare Other | Admitting: Neurology

## 2017-03-02 VITALS — BP 108/66 | HR 76 | Ht 66.0 in | Wt 162.3 lb

## 2017-03-02 DIAGNOSIS — G43009 Migraine without aura, not intractable, without status migrainosus: Secondary | ICD-10-CM | POA: Diagnosis not present

## 2017-03-02 NOTE — Progress Notes (Signed)
NEUROLOGY FOLLOW UP OFFICE NOTE  Lindsey Rodriguez [id number]  HISTORY OF PRESENT ILLNESS: Lindsey Rodriguez is a 51 year old right-handed woman with chronic neck and back pain with neuropathy, fibromyalgia, cardiomyopathy, hypertension, plantar fasciitis and history of kidney stones, depression and lumbosacral radiculopathy who follows up for migraine and lumbosacral radiculopathy.  She is accompanied by her husband who provides some history.   UPDATE: Since last visit, headaches have improved.  She discontinued several medications.  She ran out of Depakote and has not been taking it for 3 weeks.  She is feeling better off of it.  She is adhering to a healthy diet, hydration and exercise. Intensity:  Moderate to severe Duration:  A couple of hours with rest Frequency:  Every 2 weeks Abortive medication:  Tizanidine, Tylenol Antihypertensive medications:  amlodipine Antidepressant medications:  wellbutrin, fluoxetine Anticonvulsant medications:  Depakote ER 500mg , Lyrica 75mg  twice daily Vitamins/Herbal/Supplements:, coenzyme Q10 30mg  She takes tizanidine sometimes for neck tightness     HISTORY: Onset:  Childhood Location:  Varies (back of head, crown, bi-temporal, retro-orbital, band-like) Quality:  Retro-orbital throbbing, band-like vice, stabbing at back of head Initial Intensity:  4/10 constant, 8/10 when severe; August: Tension 6-9/10; Migraine 9/10 Aura:  no Prodrome:  no Associated symptoms:  Nausea, photophobia, phonophobia, tunnel vision Initial Duration:  Constant but severe episodes 8 hours (with sumatriptan and naproxen); August: Tension 98min to 3 hours; Migraine until goes to sleep Initial Frequency:  Daily but severe episodes occur once a week; August: Tension 3 days per week; Migraine once every 2 weeks) Triggers/exacerbating factors:  stress Relieving factors:  Ice pack Activity:  Cannot function at least once a week.   Past abortive therapy:  Relpax 40mg ,  Treximet, Maxalt, Zomig po, Tramadol, sumatriptan 100mg  w/wo naproxen, sumatriptan NS, Zomig NS, Onzetra Xsail, Lidocaine nasal drops Past preventative therapy:  Cymbalta, Topamax, nortriptyline, sertraline, Effexor, zonisamide, propranolol (hypotension), biofeedback.     In January, she developed acute onset of low back pain with pain radiating down the posterior thigh, lateral lower leg and dorsum of foot to big toe on the right.  There is no associated numbness or weakness.  She has taken Flexeril, which is somewhat helpful, but makes her sleepy.  Pain is improved but she still notes pain when she stands up.  There was no preceding injury.  MRI of lumbar spine from 09/26/15 showed postsurgical changes at L4-5 and L5-S1, as well as spinal stenosis at L2-3, but no at the suspected level of L5.  She hand a NCV-EMG performed on 09/28/15, which showed evidence of chronic L5 radiculopathy but nothing acute.  She was referred to Dr. Renie Ora of pain management, where she was prescribed baclofen and Percocet and underwent bilateral L5/S1 transforaminal lumbar epidural steroid injections.  They work but she has had recurrent pain.  She underwent L3-4 fusion on 06/15/16.     PAST MEDICAL HISTORY: Past Medical History:  Diagnosis Date  . Anemia   . Anxiety panic attacks   [name]  . Arthritis    knees  . Cardiomyopathy, nonischemic ([place]) 04/07/2016   EF left ventricule calculated on Lexiscan stress test at 33% with global hypokinesis.   . Chronic back pain   . Chronic neck pain   . Depression   . Dysrhythmia    pvc's with cardiomyopathy  . Family history of adverse reaction to anesthesia    mother has problems waking up  . Fibromyalgia    and neuropathy of feet  .  History of kidney stones    lithotripsy  03/2016  . Hyperlipidemia   . Hypertension   . Kidney stone    History  . Kidney stones h/o  . Melanoma ([place]) RUE   [name]  . MGUS (monoclonal gammopathy of unknown significance)  06/2015   no treatment just watching recheck blood work 12/2015  . Migraine    OTC med prn - stress related  . Plantar fasciitis right  . Seasonal allergies   . SVD (spontaneous vaginal delivery)    x 3  . Tendonitis of foot right    MEDICATIONS: Current Outpatient Prescriptions on File Prior to Visit  Medication Sig Dispense Refill  . Biotin 5000 MCG CAPS Take 1 capsule by mouth every evening.    Marland Kitchen buPROPion (WELLBUTRIN XL) 150 MG 24 hr tablet Take 150 mg by mouth daily.    . carvedilol (COREG) 6.25 MG tablet TAKE 1 (ONE) TABLET BY MOUTH TWO TIMES DAILY AS DIRECTEED  1  . cetirizine (ZYRTEC) 10 MG tablet Take 10 mg by mouth daily.    . divalproex (DEPAKOTE ER) 500 MG 24 hr tablet TAKE 1 TABLET (500 MG TOTAL) BY MOUTH DAILY. 30 tablet 2  . FLUoxetine (PROZAC) 40 MG capsule Take 40 mg by mouth daily.     Marland Kitchen lisinopril (PRINIVIL,ZESTRIL) 20 MG tablet Take 10 mg by mouth every evening.     Marland Kitchen LORazepam (ATIVAN) 0.5 MG tablet Take 1-2 tablets (0.5-1 mg total) by mouth 3 (three) times daily as needed for anxiety. Prn anxiety 30 tablet 0  . meclizine (ANTIVERT) 25 MG tablet Take 25 mg by mouth 3 (three) times daily as needed for dizziness.    . mirabegron ER (MYRBETRIQ) 50 MG TB24 tablet Take 50 mg by mouth daily.    Marland Kitchen MISC NATURAL PRODUCT OP Take 2 capsules by mouth daily. Omega-Q    . MISC NATURAL PRODUCTS PO Take 2 capsules by mouth 2 (two) times daily. Rejuvenixx    . MISC NATURAL PRODUCTS PO Take 1 capsule by mouth 2 (two) times daily. Optimal- V    . MISC NATURAL PRODUCTS PO Take 1 capsule by mouth 2 (two) times daily. Optimal-M    . MISC NATURAL PRODUCTS PO Take 1 capsule by mouth 2 (two) times daily. MagnaCal-D    . pregabalin (LYRICA) 75 MG capsule Take 1 capsule (75 mg total) by mouth 2 (two) times daily. 180 capsule 1  . pregabalin (LYRICA) 75 MG capsule Take 1 capsule (75 mg total) by mouth 2 (two) times daily. 42 capsule 0  . tiZANidine (ZANAFLEX) 2 MG tablet Take 2 mg by mouth  every 6 (six) hours as needed for muscle spasms.     No current facility-administered medications on file prior to visit.     ALLERGIES: Allergies  Allergen Reactions  . Penicillins Shortness Of Breath and Rash    Has patient had a PCN reaction causing immediate rash, facial/tongue/throat swelling, SOB or lightheadedness with hypotension: no Has patient had a PCN reaction causing severe rash involving mucus membranes or skin necrosis: no Has patient had a PCN reaction that required hospitalization no Has patient had a PCN reaction occurring within the last 10 years: no If all of the above answers are "NO", then may proceed with Cephalosporin use.   . Codeine Other (See Comments)    Manic depressive  . Voltaren [Diclofenac] Nausea And Vomiting and Other (See Comments)    Pt stated "tore stomach up"  . Erythromycin Rash  FAMILY HISTORY: Family History  Problem Relation Age of Onset  . Hypertension Mother   . Depression Mother   . Migraines Mother   . Hypothyroidism Mother   . COPD Father   . Heart failure Father 51  . Breast cancer Cousin        40's  . Cancer Cousin        breast; in her 20's  . Diabetes Neg Hx     SOCIAL HISTORY: Social History   Social History  . Marital status: Married    Spouse name: N/A  . Number of children: N/A  . Years of education: N/A   Occupational History  . Not on file.   Social History Main Topics  . Smoking status: Former Smoker    Packs/day: 0.10    Years: 3.00    Types: Cigarettes    Quit date: 07/04/1987  . Smokeless tobacco: Never Used  . Alcohol use 0.0 oz/week     Comment: ONCE A MONTH Wine  . Drug use: No  . Sexual activity: Yes    Partners: Male    Birth control/ protection: Other-see comments     Comment: husband had vasectomy   Other Topics Concern  . Not on file   Social History Narrative   Married.  Lives with husband, 1 daughter (in college), son, brother-in-law (with Down's syndrome)--moving into [facility] 02/2016.    1 dog.   1 daughter in [place], [place].   Granddaughter in [place] (with her mother)   Disabled due to anxiety.  Previously worked as Public relations account executive.    REVIEW OF SYSTEMS: Constitutional: No fevers, chills, or sweats, no generalized fatigue, change in appetite Eyes: No visual changes, double vision, eye pain Ear, nose and throat: No hearing loss, ear pain, nasal congestion, sore throat Cardiovascular: No chest pain, palpitations Respiratory:  No shortness of breath at rest or with exertion, wheezes GastrointestinaI: No nausea, vomiting, diarrhea, abdominal pain, fecal incontinence Genitourinary:  No dysuria, urinary retention or frequency Musculoskeletal:  No neck pain, back pain Integumentary: No rash, pruritus, skin lesions Neurological: as above Psychiatric: No depression, insomnia, anxiety Endocrine: No palpitations, fatigue, diaphoresis, mood swings, change in appetite, change in weight, increased thirst Hematologic/Lymphatic:  No purpura, petechiae. Allergic/Immunologic: no itchy/runny eyes, nasal congestion, recent allergic reactions, rashes  PHYSICAL EXAM: Vitals:   03/02/17 1124  BP: 108/66  Pulse: 76  SpO2: 97%   General: No acute distress.  Patient appears well-groomed.  normal body habitus. Head:  Normocephalic/atraumatic Eyes:  Fundi examined but not visualized Neck: supple, no paraspinal tenderness, full range of motion Heart:  Regular rate and rhythm Lungs:  Clear to auscultation bilaterally Back: No paraspinal tenderness Neurological Exam: alert and oriented to person, place, and time. Attention span and concentration intact, recent and remote memory intact, fund of knowledge intact.  Speech fluent and not dysarthric, language intact.  CN II-XII intact. Bulk and tone normal, muscle strength 5/5 throughout.  Sensation to light touch, temperature and vibration intact.  Deep tendon reflexes 2+ throughout, toes downgoing.  Finger to nose and  heel to shin testing intact.  Gait normal, Romberg negative.  IMPRESSION: Migraine, improved  PLAN: 1.  She will remain off of Depakote and continue her healthy lifestyle (exercise, diet and adequate hydration) 2.  Follow up in one year or as needed.  15 minutes spent face to face with patient, over 50% spent discussing management.  Metta Clines, DO  CC:  Rita Ohara, MD

## 2017-03-02 NOTE — Patient Instructions (Signed)
I am happy that you are feeling well.  Continue doing what you are doing (exercise, diet, water) Follow up in one year or as needed.

## 2017-03-07 NOTE — Progress Notes (Signed)
Chief Complaint  Patient presents with  . Annual Exam    annual exam with pelvic exam. Just had recent eye exam and prefers to do with eye doctor. Has been having lower left LBP pain for the last few months that comes and goes.     Lindsey Rodriguez is a 51 y.o. female who presents for a complete physical.  She has the following concerns:  Twinges of discomfort in her lower left back for a couple of months, questioning whether it is her kidney or muscle.  She has known h/o stones.  Pain is 1-3/10. Denies hematuria or other urinary complaints.  She sees Dr. Tomi Likens also for her headaches, and these overall are improved. She last saw him last week.  She had run out of Depakote, and had been doing well, so he said to stay off it. She hopes that her hair improves now that she is off of it. Hasn't taken it in about a month. She continues to have headaches related to her neck and tension, but hasn't had a migraine a couple of months.  Uses ES tylenol with caffeine, lays down with ice packs when she has headaches.  She had a hysterectomy and bladder tack last year, complicated by neurogenic bladder which required self-cath for a while, resolved, but now has overactive bladder.  She was started on Myrbetriq by Dr. Philis Pique, continued by urologist (Dr. Delfino Lovett Puschinsky, in [facility]). This is working very well for her.  Only rare leakage.  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 Last seen in June, due again in December.  She is under the care of Dr. Estanislado Pandy for chondrocalcinosis, OA, DJD, and fibromyalgia. She last saw her in April, when she was having flare of fibromyalgia, and complaints of knee pain.  It was suggested she discuss the possibility of cymbalta with her psychiatrist (who prescribes her wellbutrin, prozac)--see below. She is scheduled to f/u with Dr. Estanislado Pandy in [place]. She was encouraged to use her tizanidine if needed. She reports she has more good days than bad days,  related to her fibro.  If she overdoes it, she can't pick her feet up the next day, takes 2 days to recover.  She continues to see [name] for depression and anxiety and reports that her moods are good. She discussed the cymbalta as recommended by Dr. Estanislado Pandy, but they recalled that she previously didn't tolerate it.  Staying with her current regimen, which is effective for her moods.  Hypertriglyceridemia:  [name] lowfat diet. At her physical last year, we did a trial of stopping her fenofibrate. F/u lipids remained good. She continues to follow lowfat, low cholesterol diet (on the Next 56 day program). She is taking 2 fish oil daily (omega-Q, contains 530 mg each capsule, along with CoQ10).  Lab Results  Component Value Date   CHOL 190 05/03/2016   HDL 65 05/03/2016   LDLCALC 104 05/03/2016   TRIG 103 05/03/2016   CHOLHDL 2.9 05/03/2016   [name], but Lotrel was stopped after her weight loss.  She is now under the care of Dr. Nadyne Coombes for cardiomyopathy, and is on lisinopril and carvedilol. BP's have been running low, with slight dizziness. She is on the lowest tolerated doses (didn't tolerate higher doses when tried).  BP Readings from Last 3 Encounters:  03/02/17 108/66  12/12/16 (!) 109/52  10/27/16 (!) 107/44  She denies chest pain, palpitations. She had syncope in February with negative heart monitor. No  further problems.  She also has h/o neuropathy in feet. The lyrica, good shoes (Naot) and feet elevation make this manageable. She finds the neuropathy affects her balance some.  Denies falls (has stumbled some but caught herself).  Back, neck, shoulder/arm pain is overall doing pretty well.  She is able to work out with trainer 2x/week, yoga 2-3x/week. S/p back surgery in 06/2016 by Dr. Vertell Limber.  Intentional weight loss--following "the next 56 days" since 08/2015, low carb, "good fats", lean protein, lots of vegetables and water.  Cut out pork, soy,  wheat, corn, potatoes and beets. She has gained some weight gain, which she relates to stress.  Her goal is to maintain 150#.    Immunization History  Administered Date(s) Administered  . Influenza Split 03/18/2011, 04/02/2014, 05/01/2014  . Influenza Whole 07/03/2009  . Influenza, Seasonal, Injecte, Preservative Fre 06/04/2012  . Influenza,inj,Quad PF,6+ Mos 03/10/2013, 04/07/2015, 03/02/2016  . Tdap 09/05/2012   Last Pap smear: per Dr. Philis Pique, approx 10/2014; s/p hysterectomy (for benign reasons) Last mammogram: 05/2016 Last colonoscopy: 12/2012, had polyp (due again 5 years, per pt) Last DEXA: never Dentist: twice yearly Ophtho: yearly Exercise: works with trainer 2x/week (weights, treadmill), yoga 2-3x/week.  30 minutes treadmill 2x/week, 30 minutes walking 1x/week. Vitamin D last checked here 02/2016, normal at 35.  On same supplements.  Past Medical History:  Diagnosis Date  . Anemia   . Anxiety panic attacks   [name]  . Arthritis    knees  . Cardiomyopathy, nonischemic ([place]) 04/07/2016   EF left ventricule calculated on Lexiscan stress test at 33% with global hypokinesis.   . Chronic back pain   . Chronic neck pain   . Depression   . Dysrhythmia    pvc's with cardiomyopathy  . Family history of adverse reaction to anesthesia    mother has problems waking up  . Fibromyalgia    and neuropathy of feet  . History of kidney stones    lithotripsy  03/2016  . Hyperlipidemia   . Hypertension   . Kidney stone    History  . Kidney stones h/o  . Melanoma ([place]) RUE   [name]  . MGUS (monoclonal gammopathy of unknown significance) 06/2015   no treatment just watching recheck blood work 12/2015  . Migraine    OTC med prn - stress related  . Plantar fasciitis right  . Seasonal allergies   . SVD (spontaneous vaginal delivery)    x 3  . Tendonitis of foot right    Past Surgical History:  Procedure Laterality Date  . ABDOMINAL HYSTERECTOMY    . ABLATION ON  ENDOMETRIOSIS N/A 11/24/2015   Procedure: ABLATION ON ENDOMETRIOSIS;  Surgeon: [name], MD;  Location: [facility];  Service: Gynecology;  Laterality: N/A;  . ANTERIOR LAT LUMBAR FUSION Right 06/15/2016   Procedure: RIGHT LUMBAR THREE-FOUR ANTEROLATERAL LUMBAR INTERBODY FUSION WITH LATERAL PLATE;  Surgeon: Erline Levine, MD;  Location: [facility];  Service: Neurosurgery;  Laterality: Right;  . BACK SURGERY  Dr.Stern, last sx Summer 2011   X 4  (L3-S1)  . BACK SURGERY  06/2016  . BLADDER SUSPENSION N/A 11/24/2015   Procedure: TRANSVAGINAL TAPE (TVT) PROCEDURE;  Surgeon: [name], MD;  Location: [facility];  Service: Gynecology;  Laterality: N/A;  . COLONOSCOPY    . CYSTOCELE REPAIR  11/24/2015   Procedure: ANTERIOR REPAIR (CYSTOCELE);  Surgeon: [name], MD;  Location: [facility];  Service: Gynecology;;  . Consuela Mimes N/A 11/24/2015   Procedure: Consuela Mimes;  Surgeon: Sharyn Lull  Philis Pique, MD;  Location: [facility];  Service: Gynecology;  Laterality: N/A;  . DILATION AND CURETTAGE OF UTERUS    . ENDOMETRIAL ABLATION  08/2000  . EYE SURGERY Bilateral    Lasik   . KIDNEY STONE SURGERY  2011  . MELANOMA EXCISION  05/2015   right forearm; [name]  . NECK SURGERY  [name]  Summer 2011   X 4  (C4-T1 Level)  . RHINOPLASTY    . ROBOTIC ASSISTED BILATERAL SALPINGO OOPHERECTOMY Bilateral 11/24/2015   Procedure: ROBOTIC ASSISTED BILATERAL SALPINGO OOPHORECTOMY;  Surgeon: [name], MD;  Location: [facility];  Service: Gynecology;  Laterality: Bilateral;--OVARIES NOT REMOVED PER PATH REPORT  . ROBOTIC ASSISTED TOTAL HYSTERECTOMY WITH SALPINGECTOMY Bilateral 11/24/2015   Procedure: ROBOTIC ASSISTED TOTAL HYSTERECTOMY WITH SALPINGECTOMY;  Surgeon: [name], MD;  Location: [facility];  Service: Gynecology;  Laterality: Bilateral;  . TONSILLECTOMY    . WISDOM TOOTH EXTRACTION      Social History   Social History  . Marital status: Married    Spouse name: N/A  . Number of children: N/A  . Years of  education: N/A   Occupational History  . Not on file.   Social History Main Topics  . Smoking status: Former Smoker    Packs/day: 0.10    Years: 3.00    Types: Cigarettes    Quit date: 07/04/1987  . Smokeless tobacco: Never Used  . Alcohol use 0.0 oz/week     Comment: ONCE A MONTH Wine  . Drug use: No  . Sexual activity: Yes    Partners: Male    Birth control/ protection: Other-see comments, Surgical     Comment: husband had vasectomy; pt had hysterectomy   Other Topics Concern  . Not on file   Social History Narrative   Married.  Lives with husband, 1 daughter (in college at [employer] [employer]), son; brother-in-law (with Down's syndrome) moved into Memory care 03/2016.    1 dog.   1 daughter in [place], [place].   Granddaughter in [place], [place] (with her mother, who re-married)   Disabled due to anxiety.  Previously worked as Public relations account executive.    Family History  Problem Relation Age of Onset  . Hypertension Mother   . Depression Mother   . Migraines Mother   . Hypothyroidism Mother   . COPD Father   . Heart failure Father 22  . Breast cancer Cousin        40's  . Cancer Cousin        breast; in her 67's  . Diabetes Neg Hx     Outpatient Encounter Prescriptions as of 03/08/2017  Medication Sig Note  . buPROPion (WELLBUTRIN XL) 150 MG 24 hr tablet Take 150 mg by mouth daily.   . carvedilol (COREG) 6.25 MG tablet TAKE 1 (ONE) TABLET BY MOUTH TWO TIMES DAILY AS DIRECTEED   . cetirizine (ZYRTEC) 10 MG tablet Take 10 mg by mouth daily.   Marland Kitchen FLUoxetine (PROZAC) 40 MG capsule Take 40 mg by mouth daily.    Marland Kitchen lisinopril (PRINIVIL,ZESTRIL) 20 MG tablet Take 10 mg by mouth every evening.    Marland Kitchen LORazepam (ATIVAN) 0.5 MG tablet Take 1-2 tablets (0.5-1 mg total) by mouth 3 (three) times daily as needed for anxiety. Prn anxiety 03/08/2017: Uses prn, usually 2x/week  . meclizine (ANTIVERT) 25 MG tablet Take 25 mg by mouth 3 (three) times daily as needed for dizziness. 03/08/2017: Uses prn for motion  sickness (passenger in car, long trips/travel)  . mirabegron  ER (MYRBETRIQ) 50 MG TB24 tablet Take 50 mg by mouth daily.   Marland Kitchen MISC NATURAL PRODUCT OP Take 2 capsules by mouth daily. Omega-Q   . MISC NATURAL PRODUCTS PO Take 2 capsules by mouth 2 (two) times daily. Rejuvenixx   . MISC NATURAL PRODUCTS PO Take 1 capsule by mouth 2 (two) times daily. Optimal- V   . MISC NATURAL PRODUCTS PO Take 1 capsule by mouth 2 (two) times daily. Optimal-M   . MISC NATURAL PRODUCTS PO Take 1 capsule by mouth 2 (two) times daily. MagnaCal-D   . pregabalin (LYRICA) 75 MG capsule Take 1 capsule (75 mg total) by mouth 2 (two) times daily.   Marland Kitchen tiZANidine (ZANAFLEX) 2 MG tablet Take 2 mg by mouth every 6 (six) hours as needed for muscle spasms. 03/08/2017: Uses prn, once a week  . [DISCONTINUED] Biotin 5000 MCG CAPS Take 1 capsule by mouth every evening.   . [DISCONTINUED] divalproex (DEPAKOTE ER) 500 MG 24 hr tablet TAKE 1 TABLET (500 MG TOTAL) BY MOUTH DAILY.   . [DISCONTINUED] pregabalin (LYRICA) 75 MG capsule Take 1 capsule (75 mg total) by mouth 2 (two) times daily.    No facility-administered encounter medications on file as of 03/08/2017.     Allergies  Allergen Reactions  . Penicillins Shortness Of Breath and Rash    Has patient had a PCN reaction causing immediate rash, facial/tongue/throat swelling, SOB or lightheadedness with hypotension: no Has patient had a PCN reaction causing severe rash involving mucus membranes or skin necrosis: no Has patient had a PCN reaction that required hospitalization no Has patient had a PCN reaction occurring within the last 10 years: no If all of the above answers are "NO", then may proceed with Cephalosporin use.   . Codeine Other (See Comments)    Manic depressive  . Voltaren [Diclofenac] Nausea And Vomiting and Other (See Comments)    Pt stated "tore stomach up"  . Erythromycin Rash    ROS: The patient denies anorexia, fever, vision changes, decreased hearing, ear  pain, sore throat, breast concerns, chest pain, palpitations, dizziness (vertigo resolved; gets motion sick), syncope (none since 08/2016), dyspnea on exertion, cough, swelling, nausea, vomiting, diarrhea, constipation, abdominal pain, melena, hematochezia, indigestion/heartburn, hematuria, dysuria, vaginal bleeding, discharge, odor or itch, genital lesions, numbness, tingling (has in feet, but controlled/improved), weakness, tremor, suspicious skin lesions, depression, anxiety, abnormal bleeding/bruising, or enlarged lymph nodes. +urinary urgency and urge incontinence--significantly improved with Myrbetriq, rare leakage Moods are well controlled. Neuropathy in the feet is controlled with Lyrica. She has neck pain (mainly right sided), for which she takes the tizanidine once weekly prn with good results. Headaches improved, see HPI. Some weight gain since June, which she relates to stress. She has had some intermittent hoarseness, and PND.   PHYSICAL EXAM:  BP 108/60 (BP Location: Left Arm, Patient Position: Sitting, Cuff Size: Normal)   Pulse 76   Ht 5\' 6"  (1.676 m)   Wt 161 lb 9.6 oz (73.3 kg)   LMP 12/16/2012   BMI 26.08 kg/m   Wt Readings from Last 3 Encounters:  03/08/17 161 lb 9.6 oz (73.3 kg)  03/02/17 162 lb 4.8 oz (73.6 kg)  12/12/16 154 lb 3 oz (69.9 kg)    General Appearance:  Alert, cooperative, no distress, appears stated age.  Head:  Normocephalic, without obvious abnormality, atraumatic, nontender  Eyes:  PERRL, conjunctiva/corneas clear, EOM's intact, fundi benign  Ears:  Normal TM's and external ear canals  Nose: Nares normal, mucosa is  mildly edematous, without erythema or purulence. Sinuses nontender.  Throat: Lips, mucosa, and tongue normal; teeth and gums normal  Neck: Supple, no lymphadenopathy; thyroid: no enlargement/tenderness/nodules; no carotid bruit or JVD  Back:  Spine nontender, no curvature, ROM normal. Slightly tender at left lower  lumbar paraspinous muscles and L CVA  Lungs:  Clear to auscultation bilaterally without wheezes, rales or ronchi; respirations unlabored  Chest Wall:  No tenderness or deformity  Heart:  regular rate and rhythm, no murmur, rub or gallop  Breast Exam:  no nipple inversion, discharge, skin dimpling, breast masses or axillary lymphadenopathy  Abdomen:  Soft, non-tender, nondistended, normoactive bowel sounds, no masses, no hepatosplenomegaly  Genitalia:  normal external genitalia without lesions.  Normal bimanual exam.  Uterus surgically absent. No adnexal masses or tenderness  Rectal: Normal sphincter tone, no mass, heme negative stool  Extremities: No clubbing, cyanosis or edema. High arches  Pulses: 2+ and symmetric all extremities  Skin: Skin color, texture, turgor normal, no rashes or lesions  Lymph nodes: Cervical, supraclavicular, and axillary nodes normal  Neurologic: CNII-XII intact, normal strength, sensation and gait; reflexes 2+ and symmetric throughout. Sensation in feet intact to light touch and temperature  Psych: Normal mood, affect, hygiene and grooming   ASSESSMENT/PLAN:  Annual physical exam - Plan: POCT Urinalysis DIP (Proadvantage Device), Lipid panel, Glucose, random, TSH  Nonischemic cardiomyopathy ([place]) - stable; asymptomatic, on highest tolerated doses of meds (low). Prevnar rec given CM  Pure hyperglyceridemia - resolved s/p dietary changes. Last check was normal, off fenofibrate. Recheck today (given weight changes) - Plan: Lipid panel  Fibromyalgia - overall controlled on current regimen  Chronic migraine without aura without status migrainosus, not intractable - under care of Dr. Tomi Likens, doing well  MGUS (monoclonal gammopathy of unknown significance) - stable, under q6 mo care of hematologist  Neuropathy - controlled on Lyrica, stable  Vitamin D deficiency - adequately replaced with current vitamins, per previous  checks on same supplements  Need for influenza vaccination - Plan: Flu Vaccine QUAD 6+ mos PF IM (Fluarix Quad PF)  Weight gain - Plan: TSH  Immunization due - Plan: Pneumococcal conjugate vaccine 13-valent   Flu shot, Prevnar (due to cardiomyopathy) given today shingrix when available [facility] pharmacy). Risks/side effects/efficacy reviewed  Lipid, fasting glu and thyroid (pt requests to be checked, and after visit also requested for Mg to be checked)  Counseled re: stress reduction, healthy diet, healthy/realistic weight (her goal is too low, she was shooting for 150).  Discussed monthly self breast exams and yearly mammograms; at least 30 minutes of aerobic activity at least 5 days/week, weight-bearing exercise at least 2x/wk; proper sunscreen use reviewed; healthy diet, including goals of calcium and vitamin D intake and alcohol recommendations (less than or equal to 1 drink/day) reviewed; regular seatbelt use; changing batteries in smoke detectors. Immunization recommendations discussed--flu shot and [id number] given today. Shingrix recommended when available. Pneumovax age 16.Colonoscopy recommendations reviewed, UTD, 12/2017  Pt to f/u in 1 year for CPE, sooner prn.

## 2017-03-08 ENCOUNTER — Encounter: Payer: Self-pay | Admitting: Family Medicine

## 2017-03-08 ENCOUNTER — Telehealth: Payer: Self-pay | Admitting: Family Medicine

## 2017-03-08 ENCOUNTER — Ambulatory Visit (INDEPENDENT_AMBULATORY_CARE_PROVIDER_SITE_OTHER): Payer: Medicare Other | Admitting: Family Medicine

## 2017-03-08 VITALS — BP 108/60 | HR 76 | Ht 66.0 in | Wt 161.6 lb

## 2017-03-08 DIAGNOSIS — M797 Fibromyalgia: Secondary | ICD-10-CM | POA: Diagnosis not present

## 2017-03-08 DIAGNOSIS — R635 Abnormal weight gain: Secondary | ICD-10-CM | POA: Diagnosis not present

## 2017-03-08 DIAGNOSIS — G629 Polyneuropathy, unspecified: Secondary | ICD-10-CM

## 2017-03-08 DIAGNOSIS — Z0000 Encounter for general adult medical examination without abnormal findings: Secondary | ICD-10-CM | POA: Diagnosis not present

## 2017-03-08 DIAGNOSIS — E781 Pure hyperglyceridemia: Secondary | ICD-10-CM | POA: Diagnosis not present

## 2017-03-08 DIAGNOSIS — Z23 Encounter for immunization: Secondary | ICD-10-CM

## 2017-03-08 DIAGNOSIS — D472 Monoclonal gammopathy: Secondary | ICD-10-CM | POA: Diagnosis not present

## 2017-03-08 DIAGNOSIS — I428 Other cardiomyopathies: Secondary | ICD-10-CM

## 2017-03-08 DIAGNOSIS — E559 Vitamin D deficiency, unspecified: Secondary | ICD-10-CM

## 2017-03-08 DIAGNOSIS — G43709 Chronic migraine without aura, not intractable, without status migrainosus: Secondary | ICD-10-CM

## 2017-03-08 LAB — POCT URINALYSIS DIP (PROADVANTAGE DEVICE)
Bilirubin, UA: NEGATIVE
Blood, UA: NEGATIVE
Glucose, UA: NEGATIVE mg/dL
Ketones, POC UA: NEGATIVE mg/dL
Leukocytes, UA: NEGATIVE
Nitrite, UA: NEGATIVE
Protein Ur, POC: NEGATIVE mg/dL
Specific Gravity, Urine: 1.01
Urobilinogen, Ur: NEGATIVE
pH, UA: 7 (ref 5.0–8.0)

## 2017-03-08 NOTE — Telephone Encounter (Signed)
Pt is coming in for Medicare Well Visit on 03/13/18. She would like to come in for fasting labs prior to appointment on 03/08/18. Is this ok?

## 2017-03-08 NOTE — Patient Instructions (Signed)
  HEALTH MAINTENANCE RECOMMENDATIONS:  It is recommended that you get at least 30 minutes of aerobic exercise at least 5 days/week (for weight loss, you may need as much as 60-90 minutes). This can be any activity that gets your heart rate up. This can be divided in 10-15 minute intervals if needed, but try and build up your endurance at least once a week.  Weight bearing exercise is also recommended twice weekly.  Eat a healthy diet with lots of vegetables, fruits and fiber.  "Colorful" foods have a lot of vitamins (ie green vegetables, tomatoes, red peppers, etc).  Limit sweet tea, regular sodas and alcoholic beverages, all of which has a lot of calories and sugar.  Up to 1 alcoholic drink daily may be beneficial for women (unless trying to lose weight, watch sugars).  Drink a lot of water.  Calcium recommendations are 1200-1500 mg daily (1500 mg for postmenopausal women or women without ovaries), and vitamin D 1000 IU daily.  This should be obtained from diet and/or supplements (vitamins), and calcium should not be taken all at once, but in divided doses.  Monthly self breast exams and yearly mammograms for women over the age of 16 is recommended.  Sunscreen of at least SPF 30 should be used on all sun-exposed parts of the skin when outside between the hours of 10 am and 4 pm (not just when at beach or pool, but even with exercise, golf, tennis, and yard work!)  Use a sunscreen that says "broad spectrum" so it covers both UVA and UVB rays, and make sure to reapply every 1-2 hours.  Remember to change the batteries in your smoke detectors when changing your clock times in the spring and fall.  Use your seat belt every time you are in a car, and please drive safely and not be distracted with cell phones and texting while driving.  I recommend getting the new shingles vaccine (Shingrix). You will need to check with your insurance to see if it is covered, and if covered by Medicare Part D, you need to  get from the pharmacy rather than our office.  It is a series of 2 injections, spaced 2 months apart. There is currently a backorder--look into this when you see commercials on TV or early 2019 when it should be more available.

## 2017-03-08 NOTE — Telephone Encounter (Signed)
She has many other provides who also do bloodwork.  I can't truly know exactly what she is due for (unless we do exactly what we did today) this far in advance, as many things can change over a year's time.  If she wants labs done prior, she should call to schedule (and for orders) closer to the appointment. She may end up with duplicate labs otherwise)

## 2017-03-09 LAB — LIPID PANEL
Cholesterol: 241 mg/dL — ABNORMAL HIGH (ref <200)
HDL: 73 mg/dL (ref >50)
LDL Cholesterol (Calc): 144 mg/dL (calc) — ABNORMAL HIGH
Non-HDL Cholesterol (Calc): 168 mg/dL (calc) — ABNORMAL HIGH (ref <130)
Total CHOL/HDL Ratio: 3.3 (calc) (ref <5.0)
Triglycerides: 118 mg/dL (ref <150)

## 2017-03-09 LAB — TSH: TSH: 1.43 mIU/L

## 2017-03-09 LAB — GLUCOSE, RANDOM: Glucose, Bld: 84 mg/dL (ref 65–99)

## 2017-03-09 LAB — MAGNESIUM: Magnesium: 2 mg/dL (ref 1.5–2.5)

## 2017-04-02 ENCOUNTER — Ambulatory Visit (INDEPENDENT_AMBULATORY_CARE_PROVIDER_SITE_OTHER): Payer: Medicare Other | Admitting: Family Medicine

## 2017-04-02 ENCOUNTER — Encounter: Payer: Self-pay | Admitting: Family Medicine

## 2017-04-02 VITALS — BP 122/82 | HR 72 | Temp 98.5°F | Ht 66.0 in | Wt 165.2 lb

## 2017-04-02 DIAGNOSIS — T63421A Toxic effect of venom of ants, accidental (unintentional), initial encounter: Secondary | ICD-10-CM | POA: Diagnosis not present

## 2017-04-02 MED ORDER — TRIAMCINOLONE ACETONIDE 0.1 % EX CREA: 1.0000 "application " | TOPICAL_CREAM | Freq: Two times a day (BID) | CUTANEOUS | 0 refills | Status: DC

## 2017-04-02 NOTE — Patient Instructions (Addendum)
  Continue the benadryl at bedtime, and during the day, if needed. Since the topical over-the-counter cortisone cream isn't effective, we will try a prescription strength.  Use this only twice daily, and apply sparingly to the affected areas (don't spread widely like a lotion). You may continue other anti-itch measures--ie Sarna anti-itch lotion (pramoxine/menthol), caladryl/calamine, Aveeno oatmeal and cool compresses. This should gradually improve, but may take a couple of more weeks to fully resolve. Look for signs of infection (which would be related to scratching)--redness, oozing/drainage, crusting, swelling, fever, warmth to touch.  Let me know if you prefer switching from benadryl to hydroxyzine (atarax--another antihistamine that is very good for itching, may also be sedating)

## 2017-04-02 NOTE — Progress Notes (Signed)
Chief Complaint  Patient presents with  . Insect Bite    ant bites on ankles and feet. Very itchy, only thing helping is benadryl. Has tried benadryl cream and oatmeal/ginger bath.     9/22 while at the [place] in [facility], wearing sandals, she was bitten by fire ants--around the ankle, top of the feet.  There has been itching and burning.  There has been some improvement, and the burning/pain is tolerable, but the itching isn't.  She takes Claritin daily, which doesn't help.  She has been taking benadryl 2-3 times/day, which helps, but it makes her very sleepy, hard to function.  She has tried benadryl cream, caladryl and hydrocortisone cream.  She doesn't find these to be very helpful. Icing does help some.  PMH, PSH, SH reviewed  Med/allergies reviewed  ROS: no fever, chills, URI symptoms, shortness of breath.  +fatigue from the benadryl.  See HPI  PHYSICAL EXAM:  BP 122/82 (BP Location: Left Arm, Patient Position: Sitting, Cuff Size: Normal)   Pulse 72   Temp 98.5 F (36.9 C) (Tympanic)   Ht 5\' 6"  (1.676 m)   Wt 165 lb 3.2 oz (74.9 kg)   LMP 12/16/2012   BMI 26.66 kg/m   Well developed, pleasant female, in good spirits today, in no distress Skin: Scattered erythematous papules across her medial ankles, lateral, distal foot and toes.  Very small areas of excoriation, no crusting, swelling (she reports she had some initially, resolving), streaking or other abnormalities. She has 2+ pulses, and no edema Psych: normal mood, affect, hygiene and grooming Neuro: alert and oriented, cranial nerves grossly intact, normal gait   ASSESSMENT/PLAN:  Fire ant bite, accidental or unintentional, initial encounter - Plan: triamcinolone cream (KENALOG) 0.1 %   Continue the benadryl at bedtime, and during the day, if needed. Since the topical over-the-counter cortisone cream isn't effective, we will try a prescription strength.  Use this only twice daily, and apply sparingly to the  affected areas (don't spread widely like a lotion). You may continue other anti-itch measures--ie Sarna anti-itch lotion (pramoxine/menthol), caladryl/calamine, Aveeno oatmeal and cool compresses. This should gradually improve, but may take a couple of more weeks to fully resolve. Look for signs of infection (which would be related to scratching)--redness, oozing/drainage, crusting, swelling, fever, warmth to touch.  Let me know if you prefer switching from benadryl to hydroxyzine (atarax--another antihistamine that is very good for itching, may also be sedating)

## 2017-04-04 ENCOUNTER — Other Ambulatory Visit: Payer: Self-pay | Admitting: Family Medicine

## 2017-04-04 DIAGNOSIS — Z1231 Encounter for screening mammogram for malignant neoplasm of breast: Secondary | ICD-10-CM

## 2017-04-25 ENCOUNTER — Telehealth: Payer: Self-pay | Admitting: Family Medicine

## 2017-04-25 NOTE — Telephone Encounter (Signed)
Do you have their number?

## 2017-04-25 NOTE — Telephone Encounter (Signed)
[facility] called pt is out of refills for Lyrica 75mg  and they need written Rx faxed to t# [phone]

## 2017-04-25 NOTE — Telephone Encounter (Signed)
Prescription in July was for #180 with a refill. There should be a refill

## 2017-04-26 ENCOUNTER — Other Ambulatory Visit: Payer: Self-pay | Admitting: *Deleted

## 2017-04-26 DIAGNOSIS — G629 Polyneuropathy, unspecified: Secondary | ICD-10-CM

## 2017-04-26 MED ORDER — PREGABALIN 75 MG PO CAPS: 75.0000 mg | ORAL_CAPSULE | Freq: Two times a day (BID) | ORAL | 0 refills | Status: DC

## 2017-04-27 NOTE — Telephone Encounter (Signed)
Veronica handled 

## 2017-05-17 ENCOUNTER — Ambulatory Visit: Payer: Medicare Other | Admitting: Rheumatology

## 2017-05-18 ENCOUNTER — Ambulatory Visit: Payer: Medicare Other | Admitting: Rheumatology

## 2017-05-18 ENCOUNTER — Ambulatory Visit
Admission: RE | Admit: 2017-05-18 | Discharge: 2017-05-18 | Disposition: A | Discharge status: Home / Self Care | Payer: Medicare Other | Source: Ambulatory Visit | Attending: Family Medicine | Admitting: Family Medicine

## 2017-05-18 DIAGNOSIS — Z1231 Encounter for screening mammogram for malignant neoplasm of breast: Secondary | ICD-10-CM

## 2017-05-23 ENCOUNTER — Ambulatory Visit: Payer: Medicare Other | Admitting: Family Medicine

## 2017-05-23 ENCOUNTER — Encounter: Payer: Self-pay | Admitting: Family Medicine

## 2017-05-23 VITALS — BP 110/74 | HR 76 | Temp 98.0°F | Ht 66.0 in | Wt 162.8 lb

## 2017-05-23 DIAGNOSIS — H1032 Unspecified acute conjunctivitis, left eye: Secondary | ICD-10-CM | POA: Diagnosis not present

## 2017-05-23 DIAGNOSIS — J019 Acute sinusitis, unspecified: Secondary | ICD-10-CM

## 2017-05-23 MED ORDER — AZITHROMYCIN 250 MG PO TABS: ORAL_TABLET | ORAL | 0 refills | Status: DC

## 2017-05-23 MED ORDER — POLYMYXIN B-TRIMETHOPRIM 10000-0.1 UNIT/ML-% OP SOLN: OPHTHALMIC | 0 refills | Status: DC

## 2017-05-23 NOTE — Patient Instructions (Signed)
  Drink plenty of water. Get plenty of rest. Continue sinus rinses. I recommend Mucinex 12 hour twice daily to help loosen the mucus and phlegm. You may use a decongestant to help with sinus pain/pressure/drainage.  I recommend pseudoephedrine (either 12 or 24 hour kind, depending on if it causes insomnia or other side effects). If you need a cough suppressant, I recommend either getting the DM version of mucinex, or just getting a separate Delsym syrup. This is 12 hour dextromethorphan (cough suppressant). Use ibuprofen or acetaminophen if needed for pain, fever.  Use the eye drops as directed.  Be sure to wash your hands, and avoid touching your eyes to help prevent the spread to others. This can be very contagious.  Take the antibiotics as directed. It may take 3-7 days to start feeling significantly better.  Contact us in 5-7 days if not seeing any improvement or if worse.

## 2017-05-23 NOTE — Progress Notes (Signed)
Chief Complaint  Patient presents with  . Nasal Congestion    started last Tues. Sat moved to her chest-lots of chest congestion and coughing. Coughing up lots of green and white mucus and blowing out too. Left eye is draining during the day and is matted shut when she wakes up. Eye is somewhat itchy and grainy. Ears also hurting.    8 days ago she started with sinus congestion, then moved into her chest and ears.  4 days ago she started coughing, productive of green or white phlegm.  Nasal drainage is mostly clear.  In the mornings she notes green drainage.  2 nights ago she noticed her left eye watering, feeling grainy.  Felt like "something was on her glasses", feels like there is a film over her eye.  The last 2 mornings she woke up with her left eye matted shut, yellow-green. She is always light sensitive, slightly more than usual now.    She has some chest tightness/congestion. She gets out of breath easier than normal. She has been getting up thick, discolored mucus when using the Neti-pot.  She tried an OTC allergy-sinus medication (tylenol), alka selzer cough-mucus-congestion, dayquil and nyquil (yesterday and today). She has not overlapped these medications, just would run out and switch.  No known sick contacts (sick prior to seeing her granddaughter).  PMH, [place], [facility] reviewed  Outpatient Encounter Medications as of 05/23/2017  Medication Sig Note  . buPROPion (WELLBUTRIN XL) 150 MG 24 hr tablet Take 150 mg by mouth daily.   . carvedilol (COREG) 6.25 MG tablet TAKE 1 (ONE) TABLET BY MOUTH TWO TIMES DAILY AS DIRECTEED   . cetirizine (ZYRTEC) 10 MG tablet Take 10 mg by mouth daily.   . diphenhydrAMINE (BENADRYL) 25 MG tablet Take 25-50 mg by mouth every 6 (six) hours as needed.   Marland Kitchen FLUoxetine (PROZAC) 20 MG capsule Take 60 mg by mouth daily.   Marland Kitchen lisinopril (PRINIVIL,ZESTRIL) 20 MG tablet Take 10 mg by mouth every evening.    Marland Kitchen LORazepam (ATIVAN) 0.5 MG tablet Take 1-2 tablets (0.5-1 mg  total) by mouth 3 (three) times daily as needed for anxiety. Prn anxiety 03/08/2017: Uses prn, usually 2x/week  . mirabegron ER (MYRBETRIQ) 50 MG TB24 tablet Take 50 mg by mouth daily.   Marland Kitchen MISC NATURAL PRODUCT OP Take 2 capsules by mouth daily. Omega-Q   . MISC NATURAL PRODUCTS PO Take 2 capsules by mouth 2 (two) times daily. Rejuvenixx   . MISC NATURAL PRODUCTS PO Take 1 capsule by mouth 2 (two) times daily. Optimal- V   . MISC NATURAL PRODUCTS PO Take 1 capsule by mouth 2 (two) times daily. Optimal-M   . MISC NATURAL PRODUCTS PO Take 1 capsule by mouth 2 (two) times daily. MagnaCal-D   . pregabalin (LYRICA) 75 MG capsule Take 1 capsule (75 mg total) by mouth 2 (two) times daily.   . Pseudoephedrine-APAP-DM (DAYQUIL PO) Take 2 each by mouth as needed.   Marland Kitchen tiZANidine (ZANAFLEX) 2 MG tablet Take 2 mg by mouth every 6 (six) hours as needed for muscle spasms. 03/08/2017: Uses prn, once a week  . triamcinolone cream (KENALOG) 0.1 % Apply 1 application topically 2 (two) times daily. Apply sparingly to affected areas as needed, for up to 2 weeks   . [DISCONTINUED] FLUoxetine (PROZAC) 40 MG capsule Take 40 mg by mouth daily.    . meclizine (ANTIVERT) 25 MG tablet Take 25 mg by mouth 3 (three) times daily as needed for dizziness. 03/08/2017: Uses  prn for motion sickness (passenger in car, long trips/travel)   No facility-administered encounter medications on file as of 05/23/2017.    Allergies  Allergen Reactions  . Penicillins Shortness Of Breath and Rash    Has patient had a PCN reaction causing immediate rash, facial/tongue/throat swelling, SOB or lightheadedness with hypotension: no Has patient had a PCN reaction causing severe rash involving mucus membranes or skin necrosis: no Has patient had a PCN reaction that required hospitalization no Has patient had a PCN reaction occurring within the last 10 years: no If all of the above answers are "NO", then may proceed with Cephalosporin use.   . Codeine  Other (See Comments)    Manic depressive  . Voltaren [Diclofenac] Nausea And Vomiting and Other (See Comments)    Pt stated "tore stomach up"  . Erythromycin Rash   Reports she has taken z-pak in the past without any rash or problem.  ROS:  No fever, but has felt flushed/warm.  No chills.  Fatigue is worse than normal, no body aches.  No nausea, vomiting, diarrhea, urinary complaints, bleeding, bruising, rash, tick bites or other concerns   PHYSICAL EXAM:  BP 110/74   Pulse 76   Temp 98 F (36.7 C) (Oral)   Ht 5\' 6"  (1.676 m)   Wt 162 lb 12.8 oz (73.8 kg)   LMP 12/16/2012   BMI 26.28 kg/m   Tired appearing female, with occasional cough, in no acute distress. Nasal mucosa is mildly edematous, some yellow crusting and bleeding on the right side. Mildly injected conjunctiva on the left. No purulent drainage or crusting noted.  Minimal swelling/redness of the eyelids on the left. OP is clear. Sinuses are diffusely tender x 4. TM's and EAc's normal Neck: tender shotty anterior cervical lymph node on the left Heart: regular rate and rhythm, no murmur Lungs: clear bilaterally. No wheezes, rales, ronchi Skin: normal turgor, no rash Psych: normal mood, affect, hygiene and grooming Neuro: alert and oriented, cranial nerves intact, normal strength, gait   ASSESSMENT/PLAN:  Acute non-recurrent sinusitis, unspecified location - Plan: azithromycin (ZITHROMAX) 250 MG tablet  Acute conjunctivitis of left eye, unspecified acute conjunctivitis type - Plan: trimethoprim-polymyxin b (POLYTRIM) ophthalmic solution    Drink plenty of water. Continue sinus rinses. I recommend Mucinex 12 hour twice daily to help loosen the mucus and phlegm. You may use a decongestant to help with sinus pain/pressure/drainage.  I recommend pseudoephedrine (either 12 or 24 hour kind, depending on if it causes insomnia or other side effects). If you need a cough suppressant, I recommend either getting the DM  version of mucinex, or just getting a separate Delsym syrup. This is 12 hour dextromethorphan (cough suppressant). Use ibuprofen or acetaminophen if needed for pain, fever.  Use the eye drops as directed.  Be sure to wash your hands, and avoid touching your eyes to help prevent the spread to others. This can be very contagious.  Take the antibiotics as directed. It may take 3-7 days to start feeling significantly better.  Contact us in 5-7 days if not seeing any improvement or if worse.

## 2017-05-23 NOTE — Progress Notes (Signed)
Office Visit Note  Patient: Lindsey Rodriguez             Date of Birth: 1965-10-12           MRN: [id number]             PCP: Rita Ohara, MD Referring: Rita Ohara, MD Visit Date: 06/04/2017 Occupation: @GUAROCC @    Subjective:  Bilateral hand pain   History of Present Illness: Lindsey Rodriguez is a 51 y.o. female with a history of osteoarthritis, fibromyalgia, and disc disease of the C-spine.  She states she has been experiencing increased tender points with fibromyalgia due to weather changes.  She states her fatigue has increased significantly since starting "the next 56 days." She states she's been part of this program for about 2 years and feels the lifestyle has helped her a lot.  She also states her insomnia has also improved.  She continues to have discomfort in bilateral MCP joints and CMC joints.  She states she occasionally wears her [place] braces, but she does not feel like they help her much.  She states she experiences stiffness in her hands but no joint swelling.  She continues to have occasional bilateral knee pain due to her osteoarthritis.  She denies any knee swelling.  She states the discomfort is worse with weather changes.  She experiences stiffness first thing in the morning.  Her plantar fasciitis has improved greatly since buying proper fitting shoes. Patient is most concerned about her balance.  She states her balance has been worsening since her neuropathy has been progressing.  She states she is involved with silver sneakers, has a Physiological scientist with a PT background and does yoga, which helps.     Activities of Daily Living:  Patient reports morning stiffness for 45 minutes.   Patient Denies nocturnal pain.  Difficulty dressing/grooming: Denies Difficulty climbing stairs: Reports Difficulty getting out of chair: Denies Difficulty using hands for taps, buttons, cutlery, and/or writing: Denies   Review of Systems  Constitutional: Positive for fatigue.  Negative for weakness.  HENT: Negative for mouth sores, mouth dryness and nose dryness.   Eyes: Negative for redness and dryness.  Respiratory: Negative for cough, shortness of breath and difficulty breathing.   Cardiovascular: Negative for chest pain, palpitations, hypertension, irregular heartbeat and swelling in legs/feet.  Gastrointestinal: Negative for blood in stool, constipation and diarrhea.  Endocrine: Negative for increased urination.  Genitourinary: Negative for painful urination.  Musculoskeletal: Positive for arthralgias, joint pain, myalgias, morning stiffness, muscle tenderness and myalgias. Negative for joint swelling and muscle weakness.  Skin: Negative for color change, rash, hair loss, redness and sensitivity to sunlight.  Neurological: Positive for numbness and parasthesias (Related to neuropathy). Negative for dizziness and headaches.  Hematological: Negative for swollen glands.  Psychiatric/Behavioral: Positive for depressed mood (On medication). Negative for sleep disturbance. The patient is not nervous/anxious.     PMFS History:  Patient Active Problem List   Diagnosis Date Noted  . Lumbar scoliosis 06/15/2016  . Chondrocalcinosis 05/15/2016  . Primary osteoarthritis of both knees 05/15/2016  . Tendinopathy of right shoulder 05/15/2016  . Spondylosis of lumbar region 05/15/2016  . Plantar fasciitis, bilateral 05/15/2016  . Renal calcinosis 05/15/2016  . Malignant melanoma of right upper extremity including shoulder ([place]) 05/15/2016  . Nonischemic cardiomyopathy ([place]) 04/10/2016  . Post op infection 12/18/2015  . Postoperative state 11/24/2015  . Chronic migraine without aura without status migrainosus, not intractable 08/04/2015  . Fibromyalgia 07/26/2015  . 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 DJD (degenerative joint disease), cervical 03/02/2015  . Migraine headache 09/05/2012  . Fatigue 10/26/2011  . Pure hyperglyceridemia  04/27/2011  . Essential hypertension, benign 04/17/2011  . Vitamin D deficiency 04/17/2011  . Neuropathy 12/22/2010  . Headache 12/22/2010    Past Medical History:  Diagnosis Date  . Anemia   . Anxiety panic attacks   [name]  . Arthritis    knees  . Cardiomyopathy, nonischemic ([place]) 04/07/2016   EF left ventricule calculated on Lexiscan stress test at 33% with global hypokinesis.   . Chronic back pain   . Chronic neck pain   . Depression   . Dysrhythmia    pvc's with cardiomyopathy  . Family history of adverse reaction to anesthesia    mother has problems waking up  . Fibromyalgia    and neuropathy of feet  . History of kidney stones    lithotripsy  03/2016  . Hyperlipidemia   . Hypertension   . Kidney stone    History  . Kidney stones h/o  . Melanoma ([place]) RUE   [name]  . MGUS (monoclonal gammopathy of unknown significance) 06/2015   no treatment just watching recheck blood work 12/2015  . Migraine    OTC med prn - stress related  . Plantar fasciitis right  . Seasonal allergies   . SVD (spontaneous vaginal delivery)    x 3  . Tendonitis of foot right    Family History  Problem Relation Age of Onset  . Hypertension Mother   . Depression Mother   . Migraines Mother   . Hypothyroidism Mother   . COPD Father   . Heart failure Father 21  . Breast cancer Cousin        40's  . Cancer Cousin        breast; in her 4's  . Healthy Daughter   . Healthy Son   . Healthy Daughter   . Diabetes Neg Hx    Past Surgical History:  Procedure Laterality Date  . ABDOMINAL HYSTERECTOMY    . ABLATION ON ENDOMETRIOSIS N/A 11/24/2015   Procedure: ABLATION ON ENDOMETRIOSIS;  Surgeon: [name], MD;  Location: [facility];  Service: Gynecology;  Laterality: N/A;  . ANTERIOR LAT LUMBAR FUSION Right 06/15/2016   Procedure: RIGHT LUMBAR THREE-FOUR ANTEROLATERAL LUMBAR INTERBODY FUSION WITH LATERAL PLATE;  Surgeon: Erline Levine, MD;  Location: [facility];  Service: Neurosurgery;   Laterality: Right;  . BACK SURGERY  Dr.Stern, last sx Summer 2011   X 4  (L3-S1)  . BACK SURGERY  06/2016  . BLADDER SUSPENSION N/A 11/24/2015   Procedure: TRANSVAGINAL TAPE (TVT) PROCEDURE;  Surgeon: [name], MD;  Location: [facility];  Service: Gynecology;  Laterality: N/A;  . COLONOSCOPY    . CYSTOCELE REPAIR  11/24/2015   Procedure: ANTERIOR REPAIR (CYSTOCELE);  Surgeon: [name], MD;  Location: [facility];  Service: Gynecology;;  . Consuela Mimes N/A 11/24/2015   Procedure: CYSTOSCOPY;  Surgeon: [name], MD;  Location: [facility];  Service: Gynecology;  Laterality: N/A;  . DILATION AND CURETTAGE OF UTERUS    . ENDOMETRIAL ABLATION  08/2000  . EYE SURGERY Bilateral    Lasik   . KIDNEY STONE SURGERY  2011  . MELANOMA EXCISION  05/2015   right forearm; [name]  . NECK SURGERY  [name]  Summer 2011   X 4  (C4-T1 Level)  . RHINOPLASTY    . ROBOTIC ASSISTED BILATERAL SALPINGO OOPHERECTOMY Bilateral 11/24/2015  Procedure: ROBOTIC ASSISTED BILATERAL SALPINGO OOPHORECTOMY;  Surgeon: [name], MD;  Location: [facility];  Service: Gynecology;  Laterality: Bilateral;--OVARIES NOT REMOVED PER PATH REPORT  . ROBOTIC ASSISTED TOTAL HYSTERECTOMY WITH SALPINGECTOMY Bilateral 11/24/2015   Procedure: ROBOTIC ASSISTED TOTAL HYSTERECTOMY WITH SALPINGECTOMY;  Surgeon: [name], MD;  Location: [facility];  Service: Gynecology;  Laterality: Bilateral;  . TONSILLECTOMY    . WISDOM TOOTH EXTRACTION     Social History   Social History Narrative   Married.  Lives with husband, 1 daughter (in college at [employer]), son; brother-in-law (with Down's syndrome) moved into Memory care 03/2016.    1 dog.   1 daughter in [place], [place].   Granddaughter in [place], [place] (with her mother, who re-married)   Disabled due to anxiety.  Previously worked as Public relations account executive.     Objective: Vital Signs: BP 138/70 (BP Location: Left Arm, Patient Position: Sitting, Cuff Size: Normal)   Pulse 84   Resp  14   Ht 5\' 7"  (1.702 m)   Wt 162 lb (73.5 kg)   LMP 12/16/2012   BMI 25.37 kg/m    Physical Exam  Constitutional: She is oriented to person, place, and time. She appears well-developed and well-nourished.  HENT:  Head: Normocephalic and atraumatic.  Eyes: Conjunctivae and EOM are normal.  Neck: Normal range of motion.  Cardiovascular: Normal rate, regular rhythm, normal heart sounds and intact distal pulses.  Pulmonary/Chest: Effort normal and breath sounds normal.  Abdominal: Soft. Bowel sounds are normal.  Lymphadenopathy:    She has no cervical adenopathy.  Neurological: She is alert and oriented to person, place, and time.  Skin: Skin is warm and dry. Capillary refill takes less than 2 seconds.  Psychiatric: She has a normal mood and affect. Her behavior is normal.  Nursing note and vitals reviewed.    Musculoskeletal Exam: C-spine thoracic spine and lumbar spine good range of motion.  The shoulder joints, elbow joints and wrist joints, MCPs, PIPs, and DIPs good range of motion with no synovitis.  Discomfort with palpation of MCPs but no synovitis.  Hip joints knee joints and ankle joints good range of motion with no synovitis.  MTPs PIPs and DIPs good range of motion with no synovitis.  No plantar fasciitis.  No trochanteric bursitis.    CDAI Exam: No CDAI exam completed.    Investigation: No additional findings. CBC Latest Ref Rng & Units 12/12/2016 06/23/2016 06/13/2016  WBC 3.9 - 10.3 10e3/uL 6.4 11.1(H) 9.9  Hemoglobin 11.6 - 15.9 g/dL 12.7 13.4 13.7  Hematocrit 34.8 - 46.6 % 38.4 39.5 40.7  Platelets 145 - 400 10e3/uL 245 401(H) 349   CMP Latest Ref Rng & Units 03/08/2017 12/12/2016 12/12/2016  Glucose 65 - 99 mg/dL 84 80 -  BUN 7.0 - 26.0 mg/dL - 19.8 -  Creatinine 0.6 - 1.1 mg/dL - 0.8 -  Sodium 136 - 145 mEq/L - 137 -  Potassium 3.5 - 5.1 mEq/L - 4.4 -  Chloride 101 - 111 mmol/L - - -  CO2 22 - 29 mEq/L - 28 -  Calcium 8.4 - 10.4 mg/dL - 9.8 -  Total Protein 6.0  - 8.5 g/dL - 7.5 7.0  Total Bilirubin 0.20 - 1.20 mg/dL - 0.36 -  Alkaline Phos 40 - 150 U/L - 66 -  AST 5 - 34 U/L - 19 -  ALT 0 - 55 U/L - 15 -    Imaging: Mm Screening Breast Tomo Bilateral  Result Date: 05/18/2017 CLINICAL  DATA:  Screening. EXAM: 2D DIGITAL SCREENING BILATERAL MAMMOGRAM WITH CAD AND ADJUNCT TOMO COMPARISON:  Previous exam(s). ACR Breast Density Category c: The breast tissue is heterogeneously dense, which may obscure small masses. FINDINGS: There are no findings suspicious for malignancy. Images were processed with CAD. IMPRESSION: No mammographic evidence of malignancy. A result letter of this screening mammogram will be mailed directly to the patient. RECOMMENDATION: Screening mammogram in one year. (Code:SM-B-01Y) BI-RADS CATEGORY  1: Negative. Electronically Signed   By: Marin Olp M.D.   On: 05/18/2017 11:08    Speciality Comments: No specialty comments available.    Procedures:  No procedures performed Allergies: Penicillins; Codeine; Voltaren [diclofenac]; and Erythromycin   Assessment / Plan:     Visit Diagnoses: Chondrocalcinosis: Patient has not had any recent flares.  She continues to have crepitus of bilateral knees.    Primary osteoarthritis of both knees:  No effusion or warmth on exam. Crepitus bilaterally. Patient states mild discomfort with going downstairs and morning stiffness but no swelling.    DDD (degenerative disc disease), cervical - s/p C-spine fusion and discectomy by Dr. Vertell Limber: Good ROM on exam with mild discomfort.    Spondylosis of lumbar region: Chronic discomfort.   Fibromyalgia: Increased tender points with weather changes.  Insomnia and fatigue improved.  Encouraged continued exercise programs.    Plantar fasciitis, bilateral: Improved with proper fitting shoes.    Neuropathy: Patient is followed by neurology and neurosurgery.  She has had increased neuropathy symptoms over the past 6 months.  Discussed the importance of  exercises to promote better balance.  Patient declined the offer for physical therapy at this time.     Other medical problems are listed as follows:   History of hypertension  Renal calcinosis  Nonischemic cardiomyopathy ([place])    Orders: No orders of the defined types were placed in this encounter.  No orders of the defined types were placed in this encounter.   Face-to-face time spent with patient was 30 minutes. Greater than 50% of time was spent in counseling and coordination of care.  Follow-Up Instructions: Return in about 6 months (around 12/03/2017) for Fibromyalgia .    Note - This record has been created using [employer].  Chart creation errors have been sought, but may not always  have been located. Such creation errors do not reflect on  the standard of medical care.

## 2017-05-31 ENCOUNTER — Telehealth: Payer: Self-pay | Admitting: Family Medicine

## 2017-05-31 NOTE — Telephone Encounter (Signed)
Patient advised.

## 2017-05-31 NOTE — Telephone Encounter (Signed)
She was seen 11/21 and given z-pak.  You take this for 5 days, but it continues to work for a total of 10, so it is still in her system.  She should continue mucinex, sinus rinses and supportive measures. F/u here next week if not improving.

## 2017-05-31 NOTE — Telephone Encounter (Signed)
Pt called and stated that she finished the medication for a sinus infection about a week ago and symptoms have now returned. She states she again has sinus pressure, congestion, pain, headache and yellow drainage. Pt uses [facility] in [place] and can be reached at [phone].

## 2017-06-04 ENCOUNTER — Encounter: Payer: Self-pay | Admitting: Rheumatology

## 2017-06-04 ENCOUNTER — Ambulatory Visit: Payer: Medicare Other | Admitting: Rheumatology

## 2017-06-04 VITALS — BP 138/70 | HR 84 | Resp 14 | Ht 67.0 in | Wt 162.0 lb

## 2017-06-04 DIAGNOSIS — M47816 Spondylosis without myelopathy or radiculopathy, lumbar region: Secondary | ICD-10-CM | POA: Diagnosis not present

## 2017-06-04 DIAGNOSIS — M170 Bilateral primary osteoarthritis of knee: Secondary | ICD-10-CM

## 2017-06-04 DIAGNOSIS — Z8679 Personal history of other diseases of the circulatory system: Secondary | ICD-10-CM

## 2017-06-04 DIAGNOSIS — M797 Fibromyalgia: Secondary | ICD-10-CM

## 2017-06-04 DIAGNOSIS — M1120 Other chondrocalcinosis, unspecified site: Secondary | ICD-10-CM | POA: Diagnosis not present

## 2017-06-04 DIAGNOSIS — M5030 Other cervical disc degeneration, unspecified cervical region: Secondary | ICD-10-CM

## 2017-06-04 DIAGNOSIS — M722 Plantar fascial fibromatosis: Secondary | ICD-10-CM | POA: Diagnosis not present

## 2017-06-04 DIAGNOSIS — D472 Monoclonal gammopathy: Secondary | ICD-10-CM

## 2017-06-04 DIAGNOSIS — C4361 Malignant melanoma of right upper limb, including shoulder: Secondary | ICD-10-CM

## 2017-06-04 DIAGNOSIS — N29 Other disorders of kidney and ureter in diseases classified elsewhere: Secondary | ICD-10-CM

## 2017-06-04 DIAGNOSIS — I428 Other cardiomyopathies: Secondary | ICD-10-CM

## 2017-06-04 DIAGNOSIS — G629 Polyneuropathy, unspecified: Secondary | ICD-10-CM

## 2017-06-06 NOTE — Progress Notes (Signed)
Chief Complaint  Patient presents with  . Facial Pain    sinus pain and drainage are terrible. Cough is gone. Ear hurt. Throat is raw from drainage. Mucus is green in color. Around her eyes are burning.     Seen 11/21 with 8 days of symptoms, diagnosed with acute sinusitis and left conjunctivitis; treated with z-pak and polytrim eye drops. She called 11/29 and stated that she finished the medication for a sinus infection about a week ago and symptoms have now returned. She states she again has sinus pressure, congestion, pain, headache and yellow drainage.  She was advised that z-pak was still in her system, to continue with supportive measures (mucinex, sinus rinses) and to f/u if not improving.  She presents today with ongoing sinus pressure.  Drainage is now greenish-white, complaining of bilateral ear pain, raw throat from the drainage.  Ears are plugging, popping, feels pressure.. Discomfort goes back and forth between both ears, right more frequent than the left.  Pain is below her eyes, in her cheeks bilaterally, and sometimes also frontal, and sometimes the "entire face".  Doing some sinus rinses--sometimes drainage is thin and yellowy, sometimes she doesn't get much out.  Doesn't feel much relief of the sinus pressure. She is taking sudafed, which helps (for 10 of the 12 hours).  She stopped taking the mucinex when her chest congestion resolved; hasn't taken any in the last 4 days  Feels hot around around her eyes, no known fever, chills. +sick contact (husband)  PMH, [place], [place] reviewed  Outpatient Encounter Medications as of 06/07/2017  Medication Sig Note  . buPROPion (WELLBUTRIN XL) 150 MG 24 hr tablet Take 150 mg by mouth daily.   . carvedilol (COREG) 6.25 MG tablet TAKE 1 (ONE) TABLET BY MOUTH TWO TIMES DAILY AS DIRECTEED   . cetirizine (ZYRTEC) 10 MG tablet Take 10 mg by mouth daily.   Marland Kitchen FLUoxetine (PROZAC) 20 MG capsule Take 60 mg by mouth daily.   Marland Kitchen lisinopril (PRINIVIL,ZESTRIL)  20 MG tablet Take 10 mg by mouth every evening.    Marland Kitchen LORazepam (ATIVAN) 0.5 MG tablet Take 1-2 tablets (0.5-1 mg total) by mouth 3 (three) times daily as needed for anxiety. Prn anxiety 03/08/2017: Uses prn, usually 2x/week  . mirabegron ER (MYRBETRIQ) 50 MG TB24 tablet Take 50 mg by mouth daily.   Marland Kitchen MISC NATURAL PRODUCT OP Take 2 capsules by mouth daily. Omega-Q   . MISC NATURAL PRODUCTS PO Take 2 capsules by mouth 2 (two) times daily. Rejuvenixx   . MISC NATURAL PRODUCTS PO Take 1 capsule by mouth 2 (two) times daily. Optimal- V   . MISC NATURAL PRODUCTS PO Take 1 capsule by mouth 2 (two) times daily. Optimal-M   . MISC NATURAL PRODUCTS PO Take 1 capsule by mouth 2 (two) times daily. MagnaCal-D   . MISC NATURAL PRODUCTS PO Take by mouth 2 (two) times daily.   . pregabalin (LYRICA) 75 MG capsule Take 1 capsule (75 mg total) by mouth 2 (two) times daily.   . pseudoephedrine (SUDAFED) 120 MG 12 hr tablet Take 120 mg by mouth 2 (two) times daily.   Marland Kitchen tiZANidine (ZANAFLEX) 2 MG tablet Take 2 mg by mouth every 6 (six) hours as needed for muscle spasms. 03/08/2017: Uses prn, once a week  . triamcinolone cream (KENALOG) 0.1 % Apply 1 application topically 2 (two) times daily. Apply sparingly to affected areas as needed, for up to 2 weeks   . diphenhydrAMINE (BENADRYL) 25 MG tablet Take  25-50 mg by mouth every 6 (six) hours as needed.   . meclizine (ANTIVERT) 25 MG tablet Take 25 mg by mouth 3 (three) times daily as needed for dizziness. 03/08/2017: Uses prn for motion sickness (passenger in car, long trips/travel)  . [DISCONTINUED] azithromycin (ZITHROMAX) 250 MG tablet Take 2 tablets by mouth on first day, then 1 tablet by mouth on days 2 through 5 (Patient not taking: Reported on 06/04/2017)   . [DISCONTINUED] Pseudoephedrine-APAP-DM (DAYQUIL PO) Take 2 each by mouth as needed.   . [DISCONTINUED] trimethoprim-polymyxin b (POLYTRIM) ophthalmic solution Use 1-2 drops into the left eye every 4 hours while awake  (Patient not taking: Reported on 06/07/2017)    No facility-administered encounter medications on file as of 06/07/2017.     Allergies  Allergen Reactions  . Penicillins Shortness Of Breath and Rash    Has patient had a PCN reaction causing immediate rash, facial/tongue/throat swelling, SOB or lightheadedness with hypotension: no Has patient had a PCN reaction causing severe rash involving mucus membranes or skin necrosis: no Has patient had a PCN reaction that required hospitalization no Has patient had a PCN reaction occurring within the last 10 years: no If all of the above answers are "NO", then may proceed with Cephalosporin use.   . Codeine Other (See Comments)    Manic depressive  . Voltaren [Diclofenac] Nausea And Vomiting and Other (See Comments)    Pt stated "tore stomach up"  . Erythromycin Rash   ROS:  No fever, chills, dizziness, chest pain, shortness of breath, cough. URI symptoms per HPI.  Eye symptoms resolved. Denies nausea, vomiting, diarrhea, bleeding, bruising, rashes.  PHYSICAL EXAM:  BP 114/72   Pulse 72   Temp 98.2 F (36.8 C) (Oral)   Ht 5\' 7"  (1.702 m)   Wt 162 lb 3.2 oz (73.6 kg)   LMP 12/16/2012   BMI 25.40 kg/m   Well appearing female, with occasional throat-clearing, in no acute distress. HEENT: Nasal mucosa is mildly edematous, L>R, no erythema or purulent drainage.OP is clear. Sinuses are diffusely tender x 4. TM's and EAc's normal, very slight effusion noted on the left, no erythema. Neck: no lymphadenopathy or mass Heart: regular rate and rhythm, no murmur Lungs: clear bilaterally. No wheezes, rales, ronchi Skin: normal turgor, no rash Psych: normal mood, affect, hygiene and grooming Neuro: alert and oriented, cranial nerves intact, normal strength, gait   ASSESSMENT/PLAN:  Upper respiratory tract infection, unspecified type - no e/o bacterial infection at this time, just sinus congestion, ETD. Supportive measures. Start ABX if develops  s/sx infection, reviewed - Plan: Cefixime (SUPRAX) 400 MG CAPS capsule  Dysfunction of both eustachian tubes   Drink plenty of water. Resume regular mucinex /guaifenesin (don't need the DM if not coughing). Continue sudafed and zyrtec. You can consider adding flonase. Continue sinus rinses. Start the antibiotic only if you have worsening ear pain, persistent or worsening discolored mucus and sinus pain, fever.  Declines steroids (offered, as she was wanting more immediate relief; side effects/risks reviewed, declined).

## 2017-06-07 ENCOUNTER — Ambulatory Visit: Payer: Medicare Other | Admitting: Family Medicine

## 2017-06-07 ENCOUNTER — Encounter: Payer: Self-pay | Admitting: Family Medicine

## 2017-06-07 VITALS — BP 114/72 | HR 72 | Temp 98.2°F | Ht 67.0 in | Wt 162.2 lb

## 2017-06-07 DIAGNOSIS — H6983 Other specified disorders of Eustachian tube, bilateral: Secondary | ICD-10-CM | POA: Diagnosis not present

## 2017-06-07 DIAGNOSIS — J069 Acute upper respiratory infection, unspecified: Secondary | ICD-10-CM | POA: Diagnosis not present

## 2017-06-07 MED ORDER — CEFIXIME 400 MG PO CAPS: 400.0000 mg | ORAL_CAPSULE | Freq: Every day | ORAL | 0 refills | Status: DC

## 2017-06-07 NOTE — Patient Instructions (Signed)
Drink plenty of water. Resume regular mucinex /guaifenesin (don't need the DM if not coughing). Continue sudafed and zyrtec. You can consider adding flonase. Continue sinus rinses. Start the antibiotic only if you have worsening ear pain, persistent or worsening discolored mucus and sinus pain, fever.

## 2017-06-07 NOTE — Telephone Encounter (Signed)
Pt aware & will check in closer to Sept

## 2017-06-20 ENCOUNTER — Other Ambulatory Visit (HOSPITAL_BASED_OUTPATIENT_CLINIC_OR_DEPARTMENT_OTHER): Payer: Medicare Other

## 2017-06-20 ENCOUNTER — Telehealth: Payer: Self-pay | Admitting: Hematology

## 2017-06-20 ENCOUNTER — Encounter: Payer: Self-pay | Admitting: Hematology

## 2017-06-20 ENCOUNTER — Encounter: Payer: Self-pay | Admitting: Neurology

## 2017-06-20 ENCOUNTER — Ambulatory Visit: Payer: Medicare Other | Admitting: Hematology

## 2017-06-20 VITALS — BP 116/78 | HR 63 | Temp 97.9°F | Resp 18 | Ht 67.0 in | Wt 161.0 lb

## 2017-06-20 DIAGNOSIS — Z85820 Personal history of malignant melanoma of skin: Secondary | ICD-10-CM | POA: Diagnosis not present

## 2017-06-20 DIAGNOSIS — I5020 Unspecified systolic (congestive) heart failure: Secondary | ICD-10-CM | POA: Diagnosis not present

## 2017-06-20 DIAGNOSIS — D472 Monoclonal gammopathy: Secondary | ICD-10-CM | POA: Diagnosis not present

## 2017-06-20 LAB — COMPREHENSIVE METABOLIC PANEL
ALT: 19 U/L (ref 0–55)
AST: 21 U/L (ref 5–34)
Albumin: 4.6 g/dL (ref 3.5–5.0)
Alkaline Phosphatase: 64 U/L (ref 40–150)
Anion Gap: 8 mEq/L (ref 3–11)
BUN: 11.2 mg/dL (ref 7.0–26.0)
CO2: 25 mEq/L (ref 22–29)
Calcium: 9.9 mg/dL (ref 8.4–10.4)
Chloride: 103 mEq/L (ref 98–109)
Creatinine: 0.9 mg/dL (ref 0.6–1.1)
EGFR: >60 mL/min/{1.73_m2} (ref >60)
Glucose: 85 mg/dl (ref 70–140)
Potassium: 4.4 mEq/L (ref 3.5–5.1)
Sodium: 136 mEq/L (ref 136–145)
Total Bilirubin: 0.51 mg/dL (ref 0.20–1.20)
Total Protein: 8.1 g/dL (ref 6.4–8.3)

## 2017-06-20 LAB — CBC & DIFF AND RETIC
BASO%: 1 % (ref 0.0–2.0)
Basophils Absolute: 0.1 10*3/uL (ref 0.0–0.1)
EOS%: 2.7 % (ref 0.0–7.0)
Eosinophils Absolute: 0.1 10*3/uL (ref 0.0–0.5)
HCT: 40.9 % (ref 34.8–46.6)
HGB: 13.7 g/dL (ref 11.6–15.9)
Immature Retic Fract: 3.2 % (ref 1.60–10.00)
LYMPH%: 35.2 % (ref 14.0–49.7)
MCH: 33 pg (ref 25.1–34.0)
MCHC: 33.5 g/dL (ref 31.5–36.0)
MCV: 98.6 fL (ref 79.5–101.0)
MONO#: 0.5 10*3/uL (ref 0.1–0.9)
MONO%: 9.1 % (ref 0.0–14.0)
NEUT#: 2.7 10*3/uL (ref 1.5–6.5)
NEUT%: 52 % (ref 38.4–76.8)
Platelets: 301 10*3/uL (ref 145–400)
RBC: 4.15 10*6/uL (ref 3.70–5.45)
RDW: 13 % (ref 11.2–14.5)
Retic %: 0.76 % (ref 0.70–2.10)
Retic Ct Abs: 31.54 10*3/uL — ABNORMAL LOW (ref 33.70–90.70)
WBC: 5.3 10*3/uL (ref 3.9–10.3)
lymph#: 1.9 10*3/uL (ref 0.9–3.3)

## 2017-06-20 NOTE — Progress Notes (Signed)
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 Skeletal survey negative. Bone marrow deferred as per patient choice.  Current Treatment: observation   INTERVAL HISTORY:  Mrs. Chinn is here for her scheduled 6 month follow-up for IgG lambda MGUS. She reports that she has been doing well overall in the interim and that she mostly feels that same, however, she does note that her peripheral neuropathy has become more problematic and has been worsening. It is mostly present in the toes (R>L). She is currently followed by Neurosurgery since her lumbar interbody fusion and also notes that Dr Tomi Likens, her Neurologist, believes that this is neuropathy as opposed to radiculopathy based on recent nerve conduction studies. Her most recent electromyography ruled her pain as more likely radiculopathy at the level of L5, mild in degree. There was not evidence of polyneuropathy. Hey Lyrica was recently increased to try and help with this, however, she did experience some adverse side effects and this was decreased to her previous dosage. She has been asymptomatic in terms of her CHF and she states that her most recent ECHO 55moago showed continued improvement in her EF.   She reports no new bone pains. No new fatigue. No new anemia, hypercalcemia or change in kidney function. Notes no other acute new symptoms at this time.   On review of systems, pt denies fever, chills, rash, mouth sores, weight loss, decreased appetite, urinary complaints. Denies new or acute pain. Pt denies abdominal pain, nausea, vomiting. Pertinent positives are listed within the above HPI.   REVIEW OF SYSTEMS:    A 10+ POINT REVIEW OF SYSTEMS WAS  OBTAINED including neurology, dermatology, psychiatry, cardiac, respiratory, lymph, extremities, GI, GU, Musculoskeletal, constitutional, breasts, reproductive, HEENT.  All pertinent positives are noted in the HPI.  All others are negative. . Past Medical History:  Diagnosis Date  . Anemia   . Anxiety panic attacks   Dr. KToy Care . Arthritis    knees  . Cardiomyopathy, nonischemic ([facility] 04/07/2016   EF left ventricule calculated on Lexiscan stress test at 33% with global hypokinesis.   . Chronic back pain   . Chronic neck pain   . Depression   . Dysrhythmia    pvc's with cardiomyopathy  . Family history of adverse reaction to anesthesia    mother has problems waking up  . Fibromyalgia    and neuropathy of feet  . History of kidney stones    lithotripsy  03/2016  . Hyperlipidemia   . Hypertension   . Kidney stone    History  . Kidney stones h/o  . Melanoma ([facility]   Dr. GDelman Cheadle . MGUS (monoclonal gammopathy of unknown significance) 06/2015   no treatment just watching recheck blood work 12/2015  . Migraine    OTC med prn - stress related  . Plantar fasciitis right  . Seasonal allergies   . SVD (spontaneous vaginal delivery)    x 3  . Tendonitis of foot right    Past Surgical History:  Procedure Laterality Date  . ABDOMINAL HYSTERECTOMY    . ABLATION ON ENDOMETRIOSIS N/A 11/24/2015   Procedure: ABLATION ON ENDOMETRIOSIS;  Surgeon: MBobbye Charleston MD;  [facility];  Service: Gynecology;  Laterality: N/A;  .  ANTERIOR LAT LUMBAR FUSION Right 06/15/2016   Procedure: RIGHT LUMBAR THREE-FOUR ANTEROLATERAL LUMBAR INTERBODY FUSION WITH LATERAL PLATE;  Surgeon: Erline Levine, MD;  Location: [facility];  Service: Neurosurgery;  Laterality: Right;  . BACK SURGERY  Dr.Stern, last sx Summer 2011   X 4  (L3-S1)  . BACK SURGERY  06/2016  . BLADDER SUSPENSION N/A 11/24/2015   Procedure: TRANSVAGINAL TAPE (TVT) PROCEDURE;  Surgeon: [name], MD;  Location: [facility];  Service:  Gynecology;  Laterality: N/A;  . COLONOSCOPY    . CYSTOCELE REPAIR  11/24/2015   Procedure: ANTERIOR REPAIR (CYSTOCELE);  Surgeon: [name], MD;  Location: [facility];  Service: Gynecology;;  . Consuela Mimes N/A 11/24/2015   Procedure: CYSTOSCOPY;  Surgeon: [name], MD;  Location: [facility];  Service: Gynecology;  Laterality: N/A;  . DILATION AND CURETTAGE OF UTERUS    . ENDOMETRIAL ABLATION  08/2000  . EYE SURGERY Bilateral    Lasik   . KIDNEY STONE SURGERY  2011  . MELANOMA EXCISION  05/2015   right forearm; [name]  . NECK SURGERY  [name]  Summer 2011   X 4  (C4-T1 Level)  . RHINOPLASTY    . ROBOTIC ASSISTED BILATERAL SALPINGO OOPHERECTOMY Bilateral 11/24/2015   Procedure: ROBOTIC ASSISTED BILATERAL SALPINGO OOPHORECTOMY;  Surgeon: [name], MD;  Location: [facility];  Service: Gynecology;  Laterality: Bilateral;--OVARIES NOT REMOVED PER PATH REPORT  . ROBOTIC ASSISTED TOTAL HYSTERECTOMY WITH SALPINGECTOMY Bilateral 11/24/2015   Procedure: ROBOTIC ASSISTED TOTAL HYSTERECTOMY WITH SALPINGECTOMY;  Surgeon: [name], MD;  Location: [facility];  Service: Gynecology;  Laterality: Bilateral;  . TONSILLECTOMY    . WISDOM TOOTH EXTRACTION      Social History   Tobacco Use  . Smoking status: Former Smoker    Packs/day: 0.10    Years: 3.00    Pack years: 0.30    Types: Cigarettes    Last attempt to quit: 07/04/1987    Years since quitting: 29.9  . Smokeless tobacco: Never Used  Substance Use Topics  . Alcohol use: Yes    Alcohol/week: 0.0 oz    Comment: rarely   . Drug use: No   ALLERGIES:  is allergic to penicillins; codeine; voltaren [diclofenac]; and erythromycin.  MEDICATIONS:  Current Outpatient Medications  Medication Sig Dispense Refill  . buPROPion (WELLBUTRIN XL) 150 MG 24 hr tablet Take 150 mg by mouth daily.    . carvedilol (COREG) 6.25 MG tablet TAKE 1 (ONE) TABLET BY MOUTH TWO TIMES DAILY AS DIRECTEED  1  . cetirizine (ZYRTEC) 10 MG tablet Take 10 mg  by mouth daily.    . diphenhydrAMINE (BENADRYL) 25 MG tablet Take 25-50 mg by mouth every 6 (six) hours as needed.    Marland Kitchen FLUoxetine (PROZAC) 20 MG capsule Take 60 mg by mouth daily.    Marland Kitchen lisinopril (PRINIVIL,ZESTRIL) 20 MG tablet Take 10 mg by mouth every evening.     Marland Kitchen LORazepam (ATIVAN) 0.5 MG tablet Take 1-2 tablets (0.5-1 mg total) by mouth 3 (three) times daily as needed for anxiety. Prn anxiety 30 tablet 0  . meclizine (ANTIVERT) 25 MG tablet Take 25 mg by mouth 3 (three) times daily as needed for dizziness.    . mirabegron ER (MYRBETRIQ) 50 MG TB24 tablet Take 50 mg by mouth daily.    Marland Kitchen MISC NATURAL PRODUCT OP Take 2 capsules by mouth daily. Omega-Q    . MISC NATURAL PRODUCTS PO Take 2 capsules by mouth 2 (two) times daily. Rejuvenixx    .  MISC NATURAL PRODUCTS PO Take 1 capsule by mouth 2 (two) times daily. Optimal- V    . MISC NATURAL PRODUCTS PO Take 1 capsule by mouth 2 (two) times daily. Optimal-M    . MISC NATURAL PRODUCTS PO Take 1 capsule by mouth 2 (two) times daily. MagnaCal-D    . pregabalin (LYRICA) 75 MG capsule Take 1 capsule (75 mg total) by mouth 2 (two) times daily. 180 capsule 0  . pseudoephedrine (SUDAFED) 120 MG 12 hr tablet Take 120 mg by mouth 2 (two) times daily.    Marland Kitchen tiZANidine (ZANAFLEX) 2 MG tablet Take 2 mg by mouth every 6 (six) hours as needed for muscle spasms.    Marland Kitchen triamcinolone cream (KENALOG) 0.1 % Apply 1 application topically 2 (two) times daily. Apply sparingly to affected areas as needed, for up to 2 weeks 45 g 0   No current facility-administered medications for this visit.     PHYSICAL EXAMINATION: ECOG PERFORMANCE STATUS: 2  . Vitals:   06/20/17 0915  BP: 116/78  Pulse: 63  Resp: 18  Temp: 97.9 F (36.6 C)  SpO2: 100%    Filed Weights   06/20/17 0915  Weight: 161 lb (73 kg)   .Body mass index is 25.22 kg/m.  GENERAL:alert, in no acute distress and comfortable SKIN: skin color, texture, turgor are normal, no rashes or significant  lesions EYES: normal, conjunctiva are pink and non-injected, sclera clear OROPHARYNX:no exudate, no erythema and lips, buccal mucosa, and tongue normal  NECK: supple, no JVD, thyroid normal size, non-tender, without nodularity LYMPH:  no palpable lymphadenopathy in the cervical, axillary or inguinal LUNGS: clear to auscultation with normal respiratory effort HEART: regular rate & rhythm,  no murmurs and no lower extremity edema ABDOMEN: abdomen soft, non-tender, normoactive bowel sounds  Musculoskeletal: no cyanosis of digits and no clubbing  PSYCH: alert & oriented x 3 with fluent speech NEURO: no focal motor/sensory deficits  LABORATORY DATA:   I have reviewed the data as listed  . CBC Latest Ref Rng & Units 06/20/2017 12/12/2016 06/23/2016  WBC 3.9 - 10.3 10e3/uL 5.3 6.4 11.1(H)  Hemoglobin 11.6 - 15.9 g/dL 13.7 12.7 13.4  Hematocrit 34.8 - 46.6 % 40.9 38.4 39.5  Platelets 145 - 400 10e3/uL 301 245 401(H)    . CMP Latest Ref Rng & Units 06/20/2017 06/20/2017 03/08/2017  Glucose 70 - 140 mg/dl 85 - 84  BUN 7.0 - 26.0 mg/dL 11.2 - -  Creatinine 0.6 - 1.1 mg/dL 0.9 - -  Sodium 136 - 145 mEq/L 136 - -  Potassium 3.5 - 5.1 mEq/L 4.4 - -  Chloride 101 - 111 mmol/L - - -  CO2 22 - 29 mEq/L 25 - -  Calcium 8.4 - 10.4 mg/dL 9.9 - -  Total Protein 6.0 - 8.5 g/dL 8.1 7.5 -  Total Bilirubin 0.20 - 1.20 mg/dL 0.51 - -  Alkaline Phos 40 - 150 U/L 64 - -  AST 5 - 34 U/L 21 - -  ALT 0 - 55 U/L 19 - -        RADIOGRAPHIC STUDIES: I have personally reviewed the radiological images as listed and agreed with the findings in the report. No results found.  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 Previous UPEP and skeletal survey negative. Patient has no evidence of anemia, renal failure on  labs. No  hypercalcemia. No new bone pain. previous SPEP showed stable M-spike at 0.8g/dl with no significant increase over the last couple of follow-ups. Myeloma panel today 12/19 -- slightly higher at 1g/dl SFLC ratio within normal limits Labs today show stable hemoglobin level and no hypercalcemia or renal insufficiency. Plan -No evidence of progression to multiple myeloma at this time. --Return to clinic in 6 months with Dr. Irene Limbo with repeat labs.  If there is no significant change in her M spike might change to once yearly follow-up at that time.  2) Systolic nonischemic CHF patient notes improvement in her EF from 33% to 45%. 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 Plan -Continue management per dermatology and ongoing skin surveillance. -continue sun protection.  4) Peripheral radiculopathy /neuropathy -Is currently followed by Vertell Limber of Neurosurgery as well as Dr Tomi Likens of Neurology who have begun to work this up further. -Most recent electromyography showed evidence of radiculopathy, mild in degree, and without evidence of sensorimotor polyneuropathy.   Cannot r/o small fiber neuropathy Plan -Mx per neurology  Continue followup with primary care physician, rheumatology, cardiology, and neurology   RTC in 6 months with labs with Dr Irene Limbo  I spent 15 minutes counseling the patient face to face. The total time spent in the appointment was 20 minutes and more than 50% was on counseling and direct patient cares.    Sullivan Lone MD [place] AAHIVMS [facility] Hematology/Oncology Physician [place]  (Office):       [phone] (Work cell):  [phone] (Fax):           [phone]  This document serves as a record of services personally performed by Sullivan Lone, MD. It was created on his  behalf by Reola Mosher, a trained medical scribe. The creation of this record is based on the scribe's personal observations and the provider's statements to them.   .I have reviewed the above documentation for accuracy and completeness, and I agree with the above. Brunetta Genera MD MS

## 2017-06-20 NOTE — Telephone Encounter (Signed)
Gave anv and calendar for June 2019

## 2017-06-21 LAB — KAPPA/LAMBDA LIGHT CHAINS
Ig Kappa Free Light Chain: 7.6 mg/L (ref 3.3–19.4)
Ig Lambda Free Light Chain: 11.5 mg/L (ref 5.7–26.3)
Kappa/Lambda FluidC Ratio: 0.66 (ref 0.26–1.65)

## 2017-06-25 LAB — MULTIPLE MYELOMA PANEL, SERUM
Albumin SerPl Elph-Mcnc: 4.1 g/dL (ref 2.9–4.4)
Albumin/Glob SerPl: 1.3 (ref 0.7–1.7)
Alpha 1: 0.2 g/dL (ref 0.0–0.4)
Alpha2 Glob SerPl Elph-Mcnc: 0.7 g/dL (ref 0.4–1.0)
B-Globulin SerPl Elph-Mcnc: 1 g/dL (ref 0.7–1.3)
Gamma Glob SerPl Elph-Mcnc: 1.6 g/dL (ref 0.4–1.8)
Globulin, Total: 3.4 g/dL (ref 2.2–3.9)
IgA, Qn, Serum: 76 mg/dL — ABNORMAL LOW (ref 87–352)
IgG, Qn, Serum: 1503 mg/dL (ref 700–1600)
IgM, Qn, Serum: 73 mg/dL (ref 26–217)
M Protein SerPl Elph-Mcnc: 1 g/dL — ABNORMAL HIGH
Total Protein: 7.5 g/dL (ref 6.0–8.5)

## 2017-07-02 ENCOUNTER — Telehealth: Payer: Self-pay

## 2017-07-02 DIAGNOSIS — G629 Polyneuropathy, unspecified: Secondary | ICD-10-CM

## 2017-07-02 NOTE — Telephone Encounter (Signed)
-----   Message from Pieter Partridge, DO sent at 06/21/2017 10:07 AM EST ----- Sandi,  I would like to work up this patient for small fiber neuropathy. 1.  I would like to send her to [facility] for a punch skin biopsy to evaluate for small fiber neuropathy. 2.  I also want to check labs for secondary causes:  ANA, Sed Rate, TSH, B12, B6, Sjogren's antibodies (SSA/SSB).

## 2017-07-02 NOTE — Telephone Encounter (Signed)
Called and spoke with Pt, advsd her of referral to [facility] for biopsy, made appt for Thurs 07/05/16 for labs

## 2017-07-06 ENCOUNTER — Other Ambulatory Visit: Payer: Medicare Other

## 2017-07-06 DIAGNOSIS — G629 Polyneuropathy, unspecified: Secondary | ICD-10-CM

## 2017-07-09 ENCOUNTER — Telehealth: Payer: Self-pay

## 2017-07-09 NOTE — Telephone Encounter (Signed)
-----   Message from Cameron Sprang, MD sent at 07/09/2017  1:50 PM EST ----- Pls let her know the thyroid, B12, and inflammation markers are normal. B6 and another antibody test are still pending results. Thanks

## 2017-07-09 NOTE — Telephone Encounter (Signed)
Called Pt, LM on VM advising of lab results that have come back and that there are 2 still pending. Advsd Pt to call with any questions

## 2017-07-10 LAB — TSH: TSH: 1.22 mIU/L

## 2017-07-10 LAB — ANA: Anti Nuclear Antibody(ANA): NEGATIVE

## 2017-07-10 LAB — SJOGREN'S SYNDROME ANTIBODS(SSA + SSB)
SSA (Ro) (ENA) Antibody, IgG: <1 AI
SSB (La) (ENA) Antibody, IgG: <1 AI

## 2017-07-10 LAB — VITAMIN B12: Vitamin B-12: 577 pg/mL (ref 200–1100)

## 2017-07-10 LAB — VITAMIN B6: Vitamin B6: 130.6 ng/mL — ABNORMAL HIGH (ref 2.1–21.7)

## 2017-07-10 LAB — SEDIMENTATION RATE: Sed Rate: 6 mm/h (ref 0–30)

## 2017-07-11 ENCOUNTER — Ambulatory Visit: Payer: Medicare Other | Admitting: Podiatry

## 2017-07-13 ENCOUNTER — Telehealth: Payer: Self-pay

## 2017-07-13 NOTE — Telephone Encounter (Signed)
Called and LM for Pt to rtrn my call

## 2017-07-13 NOTE — Telephone Encounter (Signed)
-----   Message from Pieter Partridge, DO sent at 07/12/2017 11:50 AM EST ----- The rest of the labs are unrevealing.  The B6 is elevated.  It may cause certain neuropathies but not typically small fiber neuropathy, however.  I would recommend stopping any supplements that contain B6 and see if symptoms may improve.

## 2017-07-17 ENCOUNTER — Encounter: Payer: Self-pay | Admitting: Family Medicine

## 2017-07-18 ENCOUNTER — Ambulatory Visit: Payer: Medicare Other | Admitting: Podiatry

## 2017-07-18 ENCOUNTER — Other Ambulatory Visit: Payer: Self-pay | Admitting: Podiatry

## 2017-07-18 ENCOUNTER — Ambulatory Visit (INDEPENDENT_AMBULATORY_CARE_PROVIDER_SITE_OTHER): Payer: Medicare Other

## 2017-07-18 ENCOUNTER — Encounter: Payer: Self-pay | Admitting: Podiatry

## 2017-07-18 DIAGNOSIS — M79672 Pain in left foot: Secondary | ICD-10-CM

## 2017-07-18 DIAGNOSIS — M79671 Pain in right foot: Secondary | ICD-10-CM

## 2017-07-18 DIAGNOSIS — G629 Polyneuropathy, unspecified: Secondary | ICD-10-CM | POA: Diagnosis not present

## 2017-07-19 ENCOUNTER — Telehealth: Payer: Self-pay | Admitting: Podiatry

## 2017-07-19 NOTE — Progress Notes (Signed)
Subjective:   Patient ID: Lindsey Rodriguez, female   DOB: 52 y.o.   MRN: [id number]   HPI Patient presents stating she has had a lot of back problems and is developed progressive neuropathy in her feet and she does have balance issues and is trying to stay active but has trouble being able to be active due to the balance troubles that she is having.  This is been going on over the last 6 years and gradually getting worse   Review of Systems  All other systems reviewed and are negative.       Objective:  Physical Exam  Constitutional: She appears well-developed and well-nourished.  Cardiovascular: Intact distal pulses.  Pulmonary/Chest: Effort normal.  Musculoskeletal: Normal range of motion.  Neurological: She is alert.  Skin: Skin is warm.  Nursing note and vitals reviewed.   Neurological sensation sharp dull and vibratory.  Patient is noted to have moderate discomfort also noted but does seem to be dealing more with neurological issues than anything else neurovascular status intact muscle strength adequate range of motion within normal limits with patient found to have significant diminishment     Assessment:  Probability for neuropathy is strong with back issues or some form of hereditary or genetic condition as part of this     Plan:  H&P x-rays reviewed condition discussed at great length.  I do think that given the fact she is developing balance problems and need to stay active that physical therapy would be of benefit and I am referring her to physical therapy and I also think the balance bracing can be of great benefit to her when she needs to stay active and we are referring her to ped orthotist for balance bracing.  Patient will have nerve biopsies done and I have scheduled her for this for next week and we will order the kit provided this procedure for her.  X-rays indicate no indications of significant bone pathology or advanced arthritis

## 2017-07-19 NOTE — Telephone Encounter (Signed)
Left message for pt with spouse to have pt call me to schedule an appt to see Liliane Channel or Benjie Karvonen for bil balance braces..needs to be 45 min appt

## 2017-07-20 NOTE — Telephone Encounter (Signed)
lvm on pts cell to call back to schedule an appt with Liliane Channel or Benjie Karvonen for braces

## 2017-07-23 NOTE — Telephone Encounter (Signed)
Called and LM on VM.

## 2017-07-25 ENCOUNTER — Ambulatory Visit: Payer: Medicare Other | Admitting: Podiatry

## 2017-07-25 ENCOUNTER — Encounter: Payer: Self-pay | Admitting: Podiatry

## 2017-07-25 DIAGNOSIS — G629 Polyneuropathy, unspecified: Secondary | ICD-10-CM | POA: Diagnosis not present

## 2017-07-25 NOTE — Progress Notes (Signed)
Subjective:   Patient ID: Lindsey Rodriguez, female   DOB: 52 y.o.   MRN: [id number]   HPI Patient presents on referral for nerve biopsy to determine whether or not she has small fiber or large fiber disease   ROS      Objective:  Physical Exam  Patient has developed progressive neuropathy in her feet and lower legs with some balance issues and is referred for this procedure      Assessment:  Idiopathic neuropathy present with trying to get a better handle on this      Plan:  I went ahead today and I did injections 11 cm proximal to the lateral malleolus bilateral within the muscle.  I then did sterile prep of the area and I did a punch biopsy of the nerve bilateral with sterile instrumentation and sterile conditions.  I placed both of these in the provided for solution and I am sending for nerve evaluation and I did apply sterile dressings and I will see back in 3 weeks and we will review the results

## 2017-07-26 NOTE — Addendum Note (Signed)
Addended by: Marylou Mccoy L on: 07/26/2017 08:00 AM   Modules accepted: Orders

## 2017-08-01 ENCOUNTER — Encounter: Payer: Self-pay | Admitting: Neurology

## 2017-08-03 HISTORY — PX: LITHOTRIPSY: SUR834

## 2017-08-08 ENCOUNTER — Encounter: Payer: Self-pay | Admitting: Neurology

## 2017-08-08 ENCOUNTER — Encounter: Payer: Self-pay | Admitting: Family Medicine

## 2017-08-10 ENCOUNTER — Telehealth: Payer: Self-pay | Admitting: Family Medicine

## 2017-08-10 NOTE — Telephone Encounter (Signed)
Called pt & informed emailed Pt Assistance application and she will complete and bring back to office.  Also #56 samples Lyrica 75mg  given ok per Dr. Redmond School to hold pt

## 2017-09-05 ENCOUNTER — Encounter: Payer: Self-pay | Admitting: Family Medicine

## 2017-09-07 NOTE — Telephone Encounter (Signed)
See 08/10/17 telephone note

## 2017-09-24 ENCOUNTER — Telehealth: Payer: Self-pay | Admitting: *Deleted

## 2017-09-24 NOTE — Telephone Encounter (Signed)
-----   Message from Rita Ohara, MD sent at 09/24/2017  7:22 AM EDT ----- Regarding: unread message I replied to her message, and just got notified she didn't read it.  Please advise of my response, pasted below:  I'm honestly not sure that I trust some of the information in the article. I think it is possibly a POTENTIAL risk, vs the clear benefit you may be getting from the medication regarding neuropathy. Worsening pain leads to worsening moods and decreased function overall, so that is something that you need to decide for yourself. If you decide to come off the lyrica, it should be tapered, not stopped suddenly.   Previous Messages    ----- Message -----   From: Lindsey Rodriguez   Sent: 09/05/2017 11:39 AM EST    To: [name], MD  Subject: Non-Urgent Medical Question   I haven't brought the paperwork by for the Lyrica due to some research I've done. With the problems I'm experiencing with memory, I believe I want to come off of the Lyrica. The neuropathy pain is worsening but I want my brain to function for as long as possible. I could not attach the article but I did include a link. I really appreciate your opinion as to how I should proceed.

## 2017-09-24 NOTE — Telephone Encounter (Signed)
Spoke with patient and she went ahead and stopped the Lyrica 2-3 weeks ago and has had no worsening pain, has been using essential oils that have been helping a lot. She also went to [name] this morning for worsening panic attacks. Dr Toy Care took her off the Wellbutrin. Michela Pitcher it was too much for her body with her weightloss. Just an FYI.

## 2017-10-25 ENCOUNTER — Encounter: Payer: Self-pay | Admitting: Neurology

## 2017-10-25 NOTE — Telephone Encounter (Signed)
Per notes 3/19 pt has stopped Lyrica

## 2017-10-26 ENCOUNTER — Encounter: Payer: Self-pay | Admitting: Neurology

## 2017-10-26 NOTE — Telephone Encounter (Signed)
Pt no longer taking Lyrica

## 2017-11-19 NOTE — Progress Notes (Signed)
Office Visit Note  Patient: Lindsey Rodriguez             Date of Birth: March 20, 1966           MRN: [id number]             PCP: Rita Ohara, MD Referring: Rita Ohara, MD Visit Date: 12/03/2017 Occupation: @GUAROCC @    Subjective:  Pain in both knees.   History of Present Illness: Lindsey Rodriguez is a 52 y.o. female history of pseudogout, osteoarthritis, DDD and fibromyalgia.  She states she discontinued Lyrica which helped her memory loss.  She also had recent his skin biopsy which came positive for small fiber neuropathy.  She has been experiencing pain in her bilateral knee joints.  She also has a flare of her right foot plantar fasciitis.  She denies any flare of pseudogout.  Pain from fibromyalgia is tolerable currently. He has noticed a rash on her chest and on nape of her neck.   Activities of Daily Living:  Patient reports morning stiffness for 30 minutes.   Patient Reports nocturnal pain.  Difficulty dressing/grooming: Denies Difficulty climbing stairs: Reports Difficulty getting out of chair: Denies Difficulty using hands for taps, buttons, cutlery, and/or writing: Reports   Review of Systems  Constitutional: Positive for fatigue. Negative for night sweats, weight gain and weight loss.  HENT: Negative for mouth sores, trouble swallowing, trouble swallowing, mouth dryness and nose dryness.   Eyes: Negative for pain, redness, visual disturbance and dryness.  Respiratory: Negative for cough, shortness of breath and difficulty breathing.   Cardiovascular: Negative for chest pain, palpitations, hypertension, irregular heartbeat and swelling in legs/feet.  Gastrointestinal: Negative for abdominal pain, blood in stool, constipation and diarrhea.  Endocrine: Negative for increased urination.  Genitourinary: Negative for pelvic pain and vaginal dryness.  Musculoskeletal: Positive for arthralgias, joint pain and morning stiffness. Negative for joint swelling, myalgias, muscle  weakness, muscle tenderness and myalgias.  Skin: Positive for rash. Negative for color change, hair loss, skin tightness, ulcers and sensitivity to sunlight.  Allergic/Immunologic: Negative for susceptible to infections.  Neurological: Negative for dizziness, headaches, memory loss, night sweats and weakness.  Hematological: Negative for bruising/bleeding tendency and swollen glands.  Psychiatric/Behavioral: Negative for depressed mood, confusion and sleep disturbance. The patient is not nervous/anxious.     PMFS History:  Patient Active Problem List   Diagnosis Date Noted  . Lumbar scoliosis 06/15/2016  . Chondrocalcinosis 05/15/2016  . Primary osteoarthritis of both knees 05/15/2016  . Tendinopathy of right shoulder 05/15/2016  . Spondylosis of lumbar region 05/15/2016  . Plantar fasciitis, bilateral 05/15/2016  . Renal calcinosis 05/15/2016  . Malignant melanoma of right upper extremity including shoulder ([place]) 05/15/2016  . Nonischemic cardiomyopathy ([place]) 04/10/2016  . Post op infection 12/18/2015  . Postoperative state 11/24/2015  . Chronic migraine without aura without status migrainosus, not intractable 08/04/2015  . Fibromyalgia 07/26/2015  . MGUS (monoclonal gammopathy of unknown significance) 06/10/2015  . DJD (degenerative joint disease), cervical 03/02/2015  . Migraine headache 09/05/2012  . Fatigue 10/26/2011  . Pure hyperglyceridemia 04/27/2011  . Essential hypertension, benign 04/17/2011  . Vitamin D deficiency 04/17/2011  . Neuropathy 12/22/2010  . Headache 12/22/2010    Past Medical History:  Diagnosis Date  . Anemia   . Anxiety panic attacks   [name]  . Arthritis    knees  . Cardiomyopathy, nonischemic ([place]) 04/07/2016   EF left ventricule calculated on Lexiscan stress test at 33% with global hypokinesis.   Marland Kitchen  Chronic back pain   . Chronic neck pain   . Depression   . Dysrhythmia    pvc's with cardiomyopathy  . Family history of adverse reaction to  anesthesia    mother has problems waking up  . Fibromyalgia    and neuropathy of feet  . History of kidney stones    lithotripsy  03/2016  . Hyperlipidemia   . Hypertension   . Kidney stone    History  . Kidney stones h/o  . Melanoma ([place]) RUE   [name]  . MGUS (monoclonal gammopathy of unknown significance) 06/2015   no treatment just watching recheck blood work 12/2015  . Migraine    OTC med prn - stress related  . Plantar fasciitis right  . Renal stones 2008/2011/2017  . Seasonal allergies   . SVD (spontaneous vaginal delivery)    x 3  . Tendonitis of foot right    Family History  Problem Relation Age of Onset  . Hypertension Mother   . Depression Mother   . Migraines Mother   . Hypothyroidism Mother   . COPD Father   . Heart failure Father 35  . Breast cancer Cousin        40's  . Cancer Cousin        breast; in her 55's  . Healthy Brother   . Healthy Daughter   . Healthy Son   . Healthy Daughter   . Diabetes Neg Hx    Past Surgical History:  Procedure Laterality Date  . ABDOMINAL HYSTERECTOMY    . ABLATION ON ENDOMETRIOSIS N/A 11/24/2015   Procedure: ABLATION ON ENDOMETRIOSIS;  Surgeon: [name], MD;  Location: [facility];  Service: Gynecology;  Laterality: N/A;  . ANTERIOR LAT LUMBAR FUSION Right 06/15/2016   Procedure: RIGHT LUMBAR THREE-FOUR ANTEROLATERAL LUMBAR INTERBODY FUSION WITH LATERAL PLATE;  Surgeon: Erline Levine, MD;  Location: [facility];  Service: Neurosurgery;  Laterality: Right;  . BACK SURGERY  Dr.Stern, last sx Summer 2011   X 4  (L3-S1)  . BACK SURGERY  06/2016  . BLADDER SUSPENSION N/A 11/24/2015   Procedure: TRANSVAGINAL TAPE (TVT) PROCEDURE;  Surgeon: [name], MD;  Location: [facility];  Service: Gynecology;  Laterality: N/A;  . COLONOSCOPY    . CYSTOCELE REPAIR  11/24/2015   Procedure: ANTERIOR REPAIR (CYSTOCELE);  Surgeon: [name], MD;  Location: [facility];  Service: Gynecology;;  . Consuela Mimes N/A 11/24/2015   Procedure:  CYSTOSCOPY;  Surgeon: [name], MD;  Location: [facility];  Service: Gynecology;  Laterality: N/A;  . DILATION AND CURETTAGE OF UTERUS    . ENDOMETRIAL ABLATION  08/2000  . EYE SURGERY Bilateral    Lasik   . KIDNEY STONE SURGERY  2011  . LITHOTRIPSY  08/2017  . MELANOMA EXCISION  05/2015   right forearm; [name]  . NECK SURGERY  [name]  Summer 2011   X 4  (C4-T1 Level)  . RHINOPLASTY    . ROBOTIC ASSISTED BILATERAL SALPINGO OOPHERECTOMY Bilateral 11/24/2015   Procedure: ROBOTIC ASSISTED BILATERAL SALPINGO OOPHORECTOMY;  Surgeon: [name], MD;  Location: [facility];  Service: Gynecology;  Laterality: Bilateral;--OVARIES NOT REMOVED PER PATH REPORT  . ROBOTIC ASSISTED TOTAL HYSTERECTOMY WITH SALPINGECTOMY Bilateral 11/24/2015   Procedure: ROBOTIC ASSISTED TOTAL HYSTERECTOMY WITH SALPINGECTOMY;  Surgeon: [name], MD;  Location: [facility];  Service: Gynecology;  Laterality: Bilateral;  . TONSILLECTOMY    . WISDOM TOOTH EXTRACTION     Social History   Social History Narrative   Married.  Lives with husband, 1 daughter (in college at [employer]), son; brother-in-law (with Down's syndrome) moved into [facility] care 03/2016.    1 dog.   1 daughter in [place], [place].   Granddaughter in [place], [place] (with her mother, who re-married)   Disabled due to anxiety.  Previously worked as Public relations account executive.     Objective: Vital Signs: BP 118/73 (BP Location: Left Arm, Patient Position: Sitting, Cuff Size: Normal)   Pulse 66   Resp 14   Ht 5\' 6"  (1.676 m)   Wt 161 lb (73 kg)   LMP 12/16/2012   BMI 25.99 kg/m    Physical Exam  Constitutional: She is oriented to person, place, and time. She appears well-developed and well-nourished.  HENT:  Head: Normocephalic and atraumatic.  Eyes: Conjunctivae and EOM are normal.  Neck: Normal range of motion.  Cardiovascular: Normal rate, regular rhythm, normal heart sounds and intact distal pulses.  Pulmonary/Chest: Effort normal and breath  sounds normal.  Abdominal: Soft. Bowel sounds are normal.  Lymphadenopathy:    She has no cervical adenopathy.  Neurological: She is alert and oriented to person, place, and time.  Skin: Skin is warm and dry. Capillary refill takes less than 2 seconds.  Psychiatric: She has a normal mood and affect. Her behavior is normal.  Nursing note and vitals reviewed.    Musculoskeletal Exam: C-spine limited range of motion.  She had C-spine fusion in the past.  She also has limited range of motion of her lumbar spine.  Shoulder joints elbow joints wrist joints were in good range of motion.  She has some DIP PIP thickening in her hands.  Hip joints knee joints ankles MTPs PIPs were in good range of motion.  She has bilateral first MTP thickening.  There was no warmth swelling or effusion on examination.  CDAI Exam: No CDAI exam completed.    Investigation: No additional findings.   Imaging: No results found.  Speciality Comments: No specialty comments available.    Procedures:  No procedures performed Allergies: Penicillins; Codeine; Voltaren [diclofenac]; and Erythromycin   Assessment / Plan:     Visit Diagnoses: Chondrocalcinosis-patient has not had any flares.  Primary osteoarthritis of both knees-she has been having increased knee joint pain.  No warmth or swelling was noted.  Need for regular exercise was emphasized.  DDD (degenerative disc disease), cervical - s/p C-spine fusion and discectomy by Dr. Vertell Limber.  She has limited range of motion of her cervical spine.  Spondylosis of lumbar region-she has discomfort range of motion of her lumbar spine.  Plantar fasciitis, bilateral-she has been having increased right plantar fasciitis symptoms.  Exercises were discussed.  Fibromyalgia- is fairly well controlled.  Neuropathy - small fiber neuropathy- biopsy proven.  Patient discontinued Lyrica due to side effects on her memory.  Nonischemic cardiomyopathy ([facility])  Renal  calcinosis  History of hypertension    Orders: No orders of the defined types were placed in this encounter.  No orders of the defined types were placed in this encounter.   Face-to-face time spent with patient was 30 minutes. >50% of time was spent in counseling and coordination of care.  Follow-Up Instructions: Return in about 6 months (around 06/04/2018) for Osteoarthritis, DDD, CPPD, FMS.   Bo Merino, MD  Note - This record has been created using Editor, commissioning.  Chart creation errors have been sought, but may not always  have been located. Such creation errors do not reflect on  the standard of medical care.

## 2017-12-03 ENCOUNTER — Ambulatory Visit: Payer: Medicare Other | Admitting: Rheumatology

## 2017-12-03 ENCOUNTER — Encounter: Payer: Self-pay | Admitting: Rheumatology

## 2017-12-03 VITALS — BP 118/73 | HR 66 | Resp 14 | Ht 66.0 in | Wt 161.0 lb

## 2017-12-03 DIAGNOSIS — M1120 Other chondrocalcinosis, unspecified site: Secondary | ICD-10-CM

## 2017-12-03 DIAGNOSIS — M5030 Other cervical disc degeneration, unspecified cervical region: Secondary | ICD-10-CM

## 2017-12-03 DIAGNOSIS — G629 Polyneuropathy, unspecified: Secondary | ICD-10-CM

## 2017-12-03 DIAGNOSIS — I428 Other cardiomyopathies: Secondary | ICD-10-CM | POA: Diagnosis not present

## 2017-12-03 DIAGNOSIS — M170 Bilateral primary osteoarthritis of knee: Secondary | ICD-10-CM | POA: Diagnosis not present

## 2017-12-03 DIAGNOSIS — M722 Plantar fascial fibromatosis: Secondary | ICD-10-CM

## 2017-12-03 DIAGNOSIS — N29 Other disorders of kidney and ureter in diseases classified elsewhere: Secondary | ICD-10-CM | POA: Diagnosis not present

## 2017-12-03 DIAGNOSIS — M797 Fibromyalgia: Secondary | ICD-10-CM | POA: Diagnosis not present

## 2017-12-03 DIAGNOSIS — Z8679 Personal history of other diseases of the circulatory system: Secondary | ICD-10-CM

## 2017-12-03 DIAGNOSIS — M47816 Spondylosis without myelopathy or radiculopathy, lumbar region: Secondary | ICD-10-CM | POA: Diagnosis not present

## 2017-12-12 ENCOUNTER — Telehealth: Payer: Self-pay | Admitting: Podiatry

## 2017-12-12 NOTE — Telephone Encounter (Signed)
I would like all of my medical records from where I saw Dr. Paulla Dolly. I would like them up as soon as possible. Please call and let me know when I can pick them up at [phone]. Thank you very much. Have a great afternoon. Bye bye.

## 2017-12-13 ENCOUNTER — Encounter: Payer: Self-pay | Admitting: Podiatry

## 2017-12-13 ENCOUNTER — Telehealth: Payer: Self-pay | Admitting: Podiatry

## 2017-12-13 NOTE — Telephone Encounter (Signed)
Called pt back in regards to the message she left requesting her medical records. I told her we would be happy to take care of that but she would need to fill out and sign a medical records release form authorizing [place] to release her records. Pt requested the form be e-mailed to her at [email or web address]. I told her she would need to physically sign it and once I received the form back from her I would get it taken care of.

## 2017-12-13 NOTE — Progress Notes (Signed)
Blank medical records release form was e-mailed to pt per her request to the e-mail address of [email or web address].

## 2017-12-17 ENCOUNTER — Telehealth: Payer: Self-pay | Admitting: Podiatry

## 2017-12-17 NOTE — Telephone Encounter (Signed)
This message is for Janett Billow in Medical Records. I have tried to open the e-mail that you sent last week and the first and third attachments are both outlook and photos. I could not open. Do I need those or can I just go with the release of information form and I will have that sent back to you as soon as I hear back from you. Thank you so much for all of your help. Have a great day. Bye bye.

## 2017-12-17 NOTE — Telephone Encounter (Signed)
I called the pt back in regards to the message she left me earlier. I told her it should only be one page which is the medical records release form. I explained I have to e-mail it a certain way and the others that she was asking about might just be logo's or something like that. Pt stated she thought that was what it was but wanted to double check. I told pt to call me back if she had any other questions.

## 2017-12-18 NOTE — Progress Notes (Signed)
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 Skeletal survey negative. Bone marrow deferred as per patient choice.  Current Treatment: observation   INTERVAL HISTORY:  Lindsey Rodriguez is here for her scheduled 6 month follow-up for IgG lambda MGUS. The patient's last visit with [place] was on 06/20/17. The pt reports that she is doing well overall.   The pt reports that she has been having some lower back pain that she believes has progressed over the last couple months.  She will be seeing Dr Vertell Limber in early August. She notes that the pain radiating into her feet is her primary concern and will be discussing this with her PCP soon. She also adds that the pain in her back and legs has not changed in quality, but has increased in severity. She notes that the pain is persistent but she has been able to stay active.   She is not taking Lyrica and believes her brain function has improved since stopping Lyrica 8 months ago. She notes that she also stopped taking Wellbutrin after discussion with her psychiatrist.   Lab results today (12/19/17) of CBC, CMP, and Reticulocytes is as follows: all values are WNL except for Retic ct abs at 31.8k, Sodium at 135. MMP 12/19/17 is M spike unchanged at 1g/dl Kappa/Lambda 12/19/17 is SFLC ratio WNL  On review of systems, pt reports worsening back and leg pain, increasing cognitive clarity, moving her bowel well and denies abdominal pains, leg swelling, and any other symptoms.   REVIEW OF SYSTEMS:    A 10+ POINT REVIEW OF SYSTEMS WAS OBTAINED including neurology, dermatology, psychiatry, cardiac, respiratory, lymph, extremities, GI, GU, Musculoskeletal, constitutional,  breasts, reproductive, HEENT.  All pertinent positives are noted in the HPI.  All others are negative.  . Past Medical History:  Diagnosis Date  . Anemia   . Anxiety panic attacks   [name]  . Arthritis    knees  . Cardiomyopathy, nonischemic ([place]) 04/07/2016   EF left ventricule calculated on Lexiscan stress test at 33% with global hypokinesis.   . Chronic back pain   . Chronic neck pain   . Depression   . Dysrhythmia    pvc's with cardiomyopathy  . Family history of adverse reaction to anesthesia    mother has problems waking up  . Fibromyalgia    and neuropathy of feet  . History of kidney stones    lithotripsy  03/2016  . Hyperlipidemia   . Hypertension   . Kidney stone    History  . Kidney stones h/o  . Melanoma ([place]) RUE   [name]  . MGUS (monoclonal gammopathy of unknown significance) 06/2015   no treatment just watching recheck blood work 12/2015  . Migraine    OTC med prn - stress related  . Plantar fasciitis right  . Renal stones 2008/2011/2017  . Seasonal allergies   . SVD (spontaneous vaginal delivery)    x 3  . Tendonitis of foot right    Past Surgical History:  Procedure Laterality Date  . ABDOMINAL HYSTERECTOMY    . ABLATION ON ENDOMETRIOSIS N/A 11/24/2015   Procedure: ABLATION ON ENDOMETRIOSIS;  Surgeon: [name], MD;  Location: [facility];  Service: Gynecology;  Laterality: N/A;  . ANTERIOR  LAT LUMBAR FUSION Right 06/15/2016   Procedure: RIGHT LUMBAR THREE-FOUR ANTEROLATERAL LUMBAR INTERBODY FUSION WITH LATERAL PLATE;  Surgeon: Erline Levine, MD;  Location: [facility];  Service: Neurosurgery;  Laterality: Right;  . BACK SURGERY  Dr.Stern, last sx Summer 2011   X 4  (L3-S1)  . BACK SURGERY  06/2016  . BLADDER SUSPENSION N/A 11/24/2015   Procedure: TRANSVAGINAL TAPE (TVT) PROCEDURE;  Surgeon: [name], MD;  Location: [facility];  Service: Gynecology;  Laterality: N/A;  . COLONOSCOPY    . CYSTOCELE REPAIR  11/24/2015   Procedure: ANTERIOR REPAIR  (CYSTOCELE);  Surgeon: [name], MD;  Location: [facility];  Service: Gynecology;;  . Consuela Mimes N/A 11/24/2015   Procedure: CYSTOSCOPY;  Surgeon: [name], MD;  Location: [facility];  Service: Gynecology;  Laterality: N/A;  . DILATION AND CURETTAGE OF UTERUS    . ENDOMETRIAL ABLATION  08/2000  . EYE SURGERY Bilateral    Lasik   . KIDNEY STONE SURGERY  2011  . LITHOTRIPSY  08/2017  . MELANOMA EXCISION  05/2015   right forearm; [name]  . NECK SURGERY  [name]  Summer 2011   X 4  (C4-T1 Level)  . RHINOPLASTY    . ROBOTIC ASSISTED BILATERAL SALPINGO OOPHERECTOMY Bilateral 11/24/2015   Procedure: ROBOTIC ASSISTED BILATERAL SALPINGO OOPHORECTOMY;  Surgeon: [name], MD;  Location: [facility];  Service: Gynecology;  Laterality: Bilateral;--OVARIES NOT REMOVED PER PATH REPORT  . ROBOTIC ASSISTED TOTAL HYSTERECTOMY WITH SALPINGECTOMY Bilateral 11/24/2015   Procedure: ROBOTIC ASSISTED TOTAL HYSTERECTOMY WITH SALPINGECTOMY;  Surgeon: [name], MD;  Location: [facility];  Service: Gynecology;  Laterality: Bilateral;  . TONSILLECTOMY    . WISDOM TOOTH EXTRACTION      Social History   Tobacco Use  . Smoking status: Former Smoker    Packs/day: 0.10    Years: 3.00    Pack years: 0.30    Types: Cigarettes    Last attempt to quit: 07/04/1987    Years since quitting: 30.4  . Smokeless tobacco: Never Used  Substance Use Topics  . Alcohol use: Yes    Alcohol/week: 0.0 oz    Comment: rarely   . Drug use: No   ALLERGIES:  is allergic to penicillins; codeine; voltaren [diclofenac]; and erythromycin.  MEDICATIONS:  Current Outpatient Medications  Medication Sig Dispense Refill  . buPROPion (WELLBUTRIN XL) 150 MG 24 hr tablet Take 150 mg by mouth daily.    . carvedilol (COREG) 6.25 MG tablet TAKE 1 (ONE) TABLET BY MOUTH TWO TIMES DAILY AS DIRECTEED  1  . cetirizine (ZYRTEC) 10 MG tablet Take 10 mg by mouth daily.    . diphenhydrAMINE (BENADRYL) 25 MG tablet Take 25-50 mg by mouth  every 6 (six) hours as needed.    Marland Kitchen FLUoxetine (PROZAC) 20 MG capsule Take 60 mg by mouth daily.    Marland Kitchen lisinopril (PRINIVIL,ZESTRIL) 20 MG tablet Take 10 mg by mouth every evening.     Marland Kitchen LORazepam (ATIVAN) 0.5 MG tablet Take 1-2 tablets (0.5-1 mg total) by mouth 3 (three) times daily as needed for anxiety. Prn anxiety 30 tablet 0  . meclizine (ANTIVERT) 25 MG tablet Take 25 mg by mouth 3 (three) times daily as needed for dizziness.    . mirabegron ER (MYRBETRIQ) 50 MG TB24 tablet Take 50 mg by mouth daily.    Marland Kitchen MISC NATURAL PRODUCT OP Take 2 capsules by mouth daily. Omega-Q    . MISC NATURAL PRODUCTS PO Take 2 capsules by mouth 2 (two) times daily.  Rejuvenixx    . MISC NATURAL PRODUCTS PO Take 1 capsule by mouth 2 (two) times daily. Optimal- V    . MISC NATURAL PRODUCTS PO Take 1 capsule by mouth 2 (two) times daily. Optimal-M    . MISC NATURAL PRODUCTS PO Take 1 capsule by mouth 2 (two) times daily. MagnaCal-D    . pregabalin (LYRICA) 75 MG capsule Take 1 capsule (75 mg total) by mouth 2 (two) times daily. (Patient not taking: Reported on 12/03/2017) 180 capsule 0  . pseudoephedrine (SUDAFED) 120 MG 12 hr tablet Take 120 mg by mouth 2 (two) times daily.    Marland Kitchen tiZANidine (ZANAFLEX) 2 MG tablet Take 2 mg by mouth every 6 (six) hours as needed for muscle spasms.    Marland Kitchen triamcinolone cream (KENALOG) 0.1 % Apply 1 application topically 2 (two) times daily. Apply sparingly to affected areas as needed, for up to 2 weeks (Patient not taking: Reported on 12/03/2017) 45 g 0   No current facility-administered medications for this visit.     PHYSICAL EXAMINATION: ECOG PERFORMANCE STATUS: 2  . Vitals:   12/19/17 0902  BP: 121/75  Pulse: 68  Resp: 17  Temp: 98.3 F (36.8 C)  SpO2: 100%    Filed Weights   12/19/17 0902  Weight: 159 lb 1.6 oz (72.2 kg)   .Body mass index is 25.68 kg/m.  GENERAL:alert, in no acute distress and comfortable SKIN: no acute rashes, no significant lesions EYES:  conjunctiva are pink and non-injected, sclera anicteric OROPHARYNX: MMM, no exudates, no oropharyngeal erythema or ulceration NECK: supple, no JVD LYMPH:  no palpable lymphadenopathy in the cervical, axillary or inguinal regions LUNGS: clear to auscultation b/l with normal respiratory effort HEART: regular rate & rhythm ABDOMEN:  normoactive bowel sounds , non tender, not distended. Extremity: no pedal edema PSYCH: alert & oriented x 3 with fluent speech NEURO: no focal motor/sensory deficits   LABORATORY DATA:   I have reviewed the data as listed  . CBC Latest Ref Rng & Units 12/19/2017 06/20/2017 12/12/2016  WBC 3.9 - 10.3 K/uL 5.7 5.3 6.4  Hemoglobin 11.6 - 15.9 g/dL 13.3 13.7 12.7  Hematocrit 34.8 - 46.6 % 38.7 40.9 38.4  Platelets 145 - 400 K/uL 280 301 245    . CMP Latest Ref Rng & Units 12/19/2017 06/20/2017 06/20/2017  Glucose 70 - 140 mg/dL 84 85 -  BUN 7 - 26 mg/dL 19 11.2 -  Creatinine 0.60 - 1.10 mg/dL 0.81 0.9 -  Sodium 136 - 145 mmol/L 135(L) 136 -  Potassium 3.5 - 5.1 mmol/L 4.3 4.4 -  Chloride 98 - 109 mmol/L 103 - -  CO2 22 - 29 mmol/L 26 25 -  Calcium 8.4 - 10.4 mg/dL 9.8 9.9 -  Total Protein 6.4 - 8.3 g/dL 7.8 8.1 7.5  Total Bilirubin 0.2 - 1.2 mg/dL 0.4 0.51 -  Alkaline Phos 40 - 150 U/L 71 64 -  AST 5 - 34 U/L 18 21 -  ALT 0 - 55 U/L 17 19 -        RADIOGRAPHIC STUDIES: I have personally reviewed the radiological images as listed and agreed with the findings in the report. No results found.  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  Previous UPEP and skeletal survey negative. Patient has no evidence of anemia, renal failure on labs. No hypercalcemia.  No new bone pain. SPEP shows stable M-spike at 1g/dl with no significant increase over the last couple of  follow-ups. SFLC ratio within normal limits  Plan -Discussed pt labwork today, 12/19/17; blood counts are normal -No overt changes in MGUS, SPEP and SFLC. -No evidence of progression to multiple myeloma at this time -Will see pt back in 6 months with labs drawn one week prior   2) Systolic nonischemic CHF patient notes improvement in her EF from 33% to 45%. 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 Plan -Continue management per dermatology and ongoing skin surveillance. -continue sun protection.  4) Peripheral radiculopathy /neuropathy -Is currently followed by Vertell Limber of Neurosurgery as well as Dr Tomi Likens of Neurology who have begun to work this up further. -Most recent electromyography showed evidence of radiculopathy, mild in degree, and without evidence of sensorimotor polyneuropathy.   Cannot r/o small fiber neuropathy -  Evidence of this on epidermal nerve fiber density analysis. Plan -Mx per neurology  Continue followup with primary care physician, rheumatology, cardiology, and neurology   RTC with Dr Irene Limbo in 6 months  Labs 1 week prior to clinic visit   The toal time spent in the appt was 15 minutes and more than 50% was on counseling and direct patient cares.    Sullivan Lone MD Sanders AAHIVMS [facility] Hematology/Oncology Physician [facility]  (Office):       [phone] (Work cell):  [phone] (Fax):           [phone]  I, Baldwin Jamaica, am acting as a scribe for Dr Irene Limbo.   .I have reviewed the above documentation for accuracy and completeness, and I agree with the above. Brunetta Genera MD

## 2017-12-19 ENCOUNTER — Inpatient Hospital Stay: Payer: Medicare Other | Attending: Hematology | Admitting: Hematology

## 2017-12-19 ENCOUNTER — Telehealth: Payer: Self-pay

## 2017-12-19 ENCOUNTER — Encounter: Payer: Self-pay | Admitting: Hematology

## 2017-12-19 ENCOUNTER — Inpatient Hospital Stay: Payer: Medicare Other

## 2017-12-19 VITALS — BP 121/75 | HR 68 | Temp 98.3°F | Resp 17 | Ht 66.0 in | Wt 159.1 lb

## 2017-12-19 DIAGNOSIS — D472 Monoclonal gammopathy: Secondary | ICD-10-CM | POA: Insufficient documentation

## 2017-12-19 DIAGNOSIS — I5020 Unspecified systolic (congestive) heart failure: Secondary | ICD-10-CM | POA: Diagnosis not present

## 2017-12-19 DIAGNOSIS — Z86008 Personal history of in-situ neoplasm of other site: Secondary | ICD-10-CM | POA: Insufficient documentation

## 2017-12-19 DIAGNOSIS — M5410 Radiculopathy, site unspecified: Secondary | ICD-10-CM | POA: Diagnosis not present

## 2017-12-19 LAB — CBC WITH DIFFERENTIAL/PLATELET
Basophils Absolute: 0 10*3/uL (ref 0.0–0.1)
Basophils Relative: 1 %
Eosinophils Absolute: 0.1 10*3/uL (ref 0.0–0.5)
Eosinophils Relative: 1 %
HCT: 38.7 % (ref 34.8–46.6)
Hemoglobin: 13.3 g/dL (ref 11.6–15.9)
Lymphocytes Relative: 31 %
Lymphs Abs: 1.8 10*3/uL (ref 0.9–3.3)
MCH: 33.4 pg (ref 25.1–34.0)
MCHC: 34.4 g/dL (ref 31.5–36.0)
MCV: 97.2 fL (ref 79.5–101.0)
Monocytes Absolute: 0.4 10*3/uL (ref 0.1–0.9)
Monocytes Relative: 7 %
Neutro Abs: 3.4 10*3/uL (ref 1.5–6.5)
Neutrophils Relative %: 60 %
Platelets: 280 10*3/uL (ref 145–400)
RBC: 3.98 MIL/uL (ref 3.70–5.45)
RDW: 12.9 % (ref 11.2–14.5)
WBC: 5.7 10*3/uL (ref 3.9–10.3)

## 2017-12-19 LAB — COMPREHENSIVE METABOLIC PANEL
ALT: 17 U/L (ref 0–55)
AST: 18 U/L (ref 5–34)
Albumin: 4.4 g/dL (ref 3.5–5.0)
Alkaline Phosphatase: 71 U/L (ref 40–150)
Anion gap: 6 (ref 3–11)
BUN: 19 mg/dL (ref 7–26)
CO2: 26 mmol/L (ref 22–29)
Calcium: 9.8 mg/dL (ref 8.4–10.4)
Chloride: 103 mmol/L (ref 98–109)
Creatinine, Ser: 0.81 mg/dL (ref 0.60–1.10)
GFR calc Af Amer: >60 mL/min (ref >60)
GFR calc non Af Amer: >60 mL/min (ref >60)
Glucose, Bld: 84 mg/dL (ref 70–140)
Potassium: 4.3 mmol/L (ref 3.5–5.1)
Sodium: 135 mmol/L — ABNORMAL LOW (ref 136–145)
Total Bilirubin: 0.4 mg/dL (ref 0.2–1.2)
Total Protein: 7.8 g/dL (ref 6.4–8.3)

## 2017-12-19 LAB — RETICULOCYTES
RBC.: 3.98 MIL/uL (ref 3.70–5.45)
Retic Count, Absolute: 31.8 10*3/uL — ABNORMAL LOW (ref 33.7–90.7)
Retic Ct Pct: 0.8 % (ref 0.7–2.1)

## 2017-12-19 NOTE — Telephone Encounter (Signed)
Printed avs and calender of upcoming appointment. Per 6/19 los 

## 2017-12-20 LAB — KAPPA/LAMBDA LIGHT CHAINS
Kappa free light chain: 8.8 mg/L (ref 3.3–19.4)
Kappa, lambda light chain ratio: 0.81 (ref 0.26–1.65)
Lambda free light chains: 10.8 mg/L (ref 5.7–26.3)

## 2017-12-21 LAB — MULTIPLE MYELOMA PANEL, SERUM
Albumin SerPl Elph-Mcnc: 4.2 g/dL (ref 2.9–4.4)
Albumin/Glob SerPl: 1.5 (ref 0.7–1.7)
Alpha 1: 0.1 g/dL (ref 0.0–0.4)
Alpha2 Glob SerPl Elph-Mcnc: 0.6 g/dL (ref 0.4–1.0)
B-Globulin SerPl Elph-Mcnc: 0.9 g/dL (ref 0.7–1.3)
Gamma Glob SerPl Elph-Mcnc: 1.3 g/dL (ref 0.4–1.8)
Globulin, Total: 2.9 g/dL (ref 2.2–3.9)
IgA: 75 mg/dL — ABNORMAL LOW (ref 87–352)
IgG (Immunoglobin G), Serum: 1508 mg/dL (ref 700–1600)
IgM (Immunoglobulin M), Srm: 60 mg/dL (ref 26–217)
M Protein SerPl Elph-Mcnc: 1 g/dL — ABNORMAL HIGH
Total Protein ELP: 7.1 g/dL (ref 6.0–8.5)

## 2017-12-26 ENCOUNTER — Telehealth: Payer: Self-pay

## 2017-12-26 ENCOUNTER — Telehealth: Payer: Self-pay | Admitting: Podiatry

## 2017-12-26 NOTE — Telephone Encounter (Signed)
-----   Message from Brunetta Genera, MD sent at 12/26/2017  8:23 AM EDT ----- Please let the patient know her Myeloma panel , M spike remains stable and unchanged with no evidence of progression based on this. Thanks Wright

## 2017-12-26 NOTE — Telephone Encounter (Signed)
I called the patient to let her know that her M spike remains unchanged and there is no evidence of progression based on her labs.  Patient verbalized understanding.

## 2017-12-26 NOTE — Telephone Encounter (Signed)
Called pt to let her know that her requested medical records and CD of x-rays were ready to pick up at her convenience. Pt stated she would come by to get them next time she is in [place].

## 2018-01-07 ENCOUNTER — Encounter: Payer: Self-pay | Admitting: Rheumatology

## 2018-01-21 ENCOUNTER — Encounter: Payer: Self-pay | Admitting: Family Medicine

## 2018-01-21 LAB — HM COLONOSCOPY

## 2018-01-28 ENCOUNTER — Other Ambulatory Visit: Payer: Self-pay | Admitting: Rehabilitation

## 2018-01-28 DIAGNOSIS — M5106 Intervertebral disc disorders with myelopathy, lumbar region: Secondary | ICD-10-CM

## 2018-01-29 ENCOUNTER — Telehealth: Payer: Self-pay | Admitting: Nurse Practitioner

## 2018-01-29 NOTE — Telephone Encounter (Signed)
Phone call to patient to verify medication list and allergies for myelogram procedure. Pt informed she will need to stop prozac 48hrs prior to myelogram appointment time. Pt verbalized understanding.

## 2018-02-08 ENCOUNTER — Ambulatory Visit
Admission: RE | Admit: 2018-02-08 | Discharge: 2018-02-08 | Disposition: A | Discharge status: Home / Self Care | Payer: Medicare Other | Source: Ambulatory Visit | Attending: Rehabilitation | Admitting: Rehabilitation

## 2018-02-08 DIAGNOSIS — M5106 Intervertebral disc disorders with myelopathy, lumbar region: Secondary | ICD-10-CM

## 2018-02-08 MED ORDER — IOPAMIDOL (ISOVUE-M 200) INJECTION 41%
15.0000 mL | Freq: Once | INTRAMUSCULAR | Status: AC
  Administered 2018-02-08: 15 mL via INTRATHECAL

## 2018-02-08 MED ORDER — DIAZEPAM 5 MG PO TABS
10.0000 mg | ORAL_TABLET | Freq: Once | ORAL | Status: AC
  Administered 2018-02-08: 5 mg via ORAL

## 2018-02-08 MED ORDER — HYDROMORPHONE HCL 1 MG/ML IJ SOLN
1.0000 mg | Freq: Once | INTRAMUSCULAR | Status: AC
  Administered 2018-02-08: 1 mg via INTRAMUSCULAR

## 2018-02-08 NOTE — Progress Notes (Signed)
Patient states she has been off Prozac for at least the past two days.

## 2018-02-08 NOTE — Discharge Instructions (Signed)
Myelogram Discharge Instructions  1. Go home and rest quietly for the next 24 hours.  It is important to lie flat for the next 24 hours.  Get up only to go to the restroom.  You may lie in the bed or on a couch on your back, your stomach, your left side or your right side.  You may have one pillow under your head.  You may have pillows between your knees while you are on your side or under your knees while you are on your back.  2. DO NOT drive today.  Recline the seat as far back as it will go, while still wearing your seat belt, on the way home.  3. You may get up to go to the bathroom as needed.  You may sit up for 10 minutes to eat.  You may resume your normal diet and medications unless otherwise indicated.  Drink lots of extra fluids today and tomorrow.  4. The incidence of headache, nausea, or vomiting is about 5% (one in 20 patients).  If you develop a headache, lie flat and drink plenty of fluids until the headache goes away.  Caffeinated beverages may be helpful.  If you develop severe nausea and vomiting or a headache that does not go away with flat bed rest, call [phone].  5. You may resume normal activities after your 24 hours of bed rest is over; however, do not exert yourself strongly or do any heavy lifting tomorrow. If when you get up you have a headache when standing, go back to bed and force fluids for another 24 hours.  6. Call your physician for a follow-up appointment.  The results of your myelogram will be sent directly to your physician by the following day.  7. If you have any questions or if complications develop after you arrive home, please call [phone].  Discharge instructions have been explained to the patient.  The patient, or the person responsible for the patient, fully understands these instructions.   YOU MAY RESTART YOUR PROZAC TOMORROW 02/09/2018 AT 10:30AM.

## 2018-03-05 ENCOUNTER — Encounter: Payer: Self-pay | Admitting: Neurology

## 2018-03-05 ENCOUNTER — Ambulatory Visit: Payer: Medicare Other | Admitting: Neurology

## 2018-03-05 ENCOUNTER — Other Ambulatory Visit: Payer: Self-pay

## 2018-03-05 VITALS — BP 116/82 | HR 79 | Ht 66.0 in | Wt 155.0 lb

## 2018-03-05 DIAGNOSIS — G43709 Chronic migraine without aura, not intractable, without status migrainosus: Secondary | ICD-10-CM

## 2018-03-05 DIAGNOSIS — G6289 Other specified polyneuropathies: Secondary | ICD-10-CM

## 2018-03-05 DIAGNOSIS — M5417 Radiculopathy, lumbosacral region: Secondary | ICD-10-CM | POA: Diagnosis not present

## 2018-03-05 DIAGNOSIS — M5410 Radiculopathy, site unspecified: Secondary | ICD-10-CM

## 2018-03-05 DIAGNOSIS — G629 Polyneuropathy, unspecified: Secondary | ICD-10-CM

## 2018-03-05 MED ORDER — TIZANIDINE HCL 4 MG PO TABS: 4.0000 mg | ORAL_TABLET | Freq: Four times a day (QID) | ORAL | 5 refills | Status: DC | PRN

## 2018-03-05 NOTE — Patient Instructions (Signed)
1.  I will look into seeing how much one of the new migraine medications (monthly self-injection) would cost for you.  If it is not feasible, then I recommend Botox 2.  Tizanidine refilled.  Take Tylenol and tizanidine for acute migraines.  Limit use to no more than 2 days out of week to prevent rebound headache 3.  Keep headache diary 4.  Follow up in 6 months.

## 2018-03-05 NOTE — Progress Notes (Signed)
NEUROLOGY FOLLOW UP OFFICE NOTE  Lindsey Rodriguez [id number]  HISTORY OF PRESENT ILLNESS: Lindsey Rodriguez is a 52 year old right-handed female with chronic neck and back pain with neuropathy, fibromyalgia, cardiomyopathy, hypertension, plantar fasciitis, and history of kidney sones, depression and lumbosacral radiculopathy who follows up for migraine and small fiber neuropathy.  She is accompanied by her husband who supplements history  UPDATE: Headaches were improved until last 3 months.  She ran out of tizanidine Intensity:  Mild to severe Duration:  1 hour to 6 hours Frequency:  Daily to every other day Frequency of abortive medication: 3 days a week Current NSAIDS:  no Current analgesics:  Tylenol  Current triptans:  no Current ergotamine:  no Current anti-emetic:  no Current muscle relaxants:  Tizanidine (also for neck stiffness) Current anti-anxiolytic:  lorazepam Current sleep aide:  no Current Antihypertensive medications:  Lisinopril, Coreg Current Antidepressant medications:  fluoxetine 60mg  Current Anticonvulsant medications:  Depakote ER 500mg  Lyrica 75mg  twice daily Current anti-CGRP:  no Current Vitamins/Herbal/Supplements:  CoQ10 30mg  Current Antihistamines/Decongestants:  no Other therapy:  no  Depression:  yes; Anxiety:  yes  Last year, she reported increased numbness and tingling in the feet (worse on the right).  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  Labs from January, including B12, Sjogren's, Sed rate, ANA, and TSH were normal.  B6 was elevated at 130.6.  She was advised to discontinue any B6 containing foods.  Punch skin biopsy was indicative of small fiber neuropathy.  CT myelogram was ordered, which revealed progressed damage at L2.  She underwent bilateral epidural injection at L2 which worked for two days  HISTORY: Onset:  Childhood Location: Varies (back of head, crown, bi-temporal, retro-orbital, band-like) Quality: Retro-orbital  throbbing, band-like vice, stabbing at back of head Initial Intensity: 4/10 constant, 8/10 when severe; August: Tension 6-9/10; Migraine 9/10 Aura: no Prodrome: no Associated symptoms:  Nausea, photophobia, phonophobia, tunnel vision.  No associated vomiting or unilateral numbness or weakness. Initial Duration: Constant but severe episodes 8 hours (with sumatriptan and naproxen); August: Tension 64min to 3 hours; Migraine until goes to sleep Initial Frequency: Daily but severe episodes occur once a week; August: Tension 3 days per week; Migraine once every 2 weeks) Triggers/aggravating factors:  Emotional stress Relieving factors: Ice pack Activity: Cannot function at least once a week.  Past abortive therapy: Relpax 40mg , Treximet, Maxalt, Zomig po, Tramadol, sumatriptan 100mg  w/wo naproxen, sumatriptan NS, Zomig NS, Onzetra Xsail, Lidocaine nasal drops Past preventative therapy: Cymbalta, Topamax, nortriptyline, sertraline, Effexor, zonisamide, propranolol (hypotension), amlodipine, Lyrica (cognitive difficulties), Depakote ER 500mg , biofeedback.   In January 2017, she developed acute onset of low back pain with pain radiating down the posterior thigh, lateral lower leg and dorsum of foot to big toe on the right. There is no associated numbness or weakness. She has taken Flexeril, which is somewhat helpful, but makes her sleepy. Pain is improved but she still notes pain when she stands up.  There was no preceding injury.  MRI of lumbar spine from 09/26/15 showed postsurgical changes at L4-5 and L5-S1, as well as spinal stenosis at L2-3, but no at the suspected level of L5.  She hand a NCV-EMG performed on 09/28/15, which showed evidence of chronic L5 radiculopathy but nothing acute.  She was referred to Dr. Renie Ora of pain management, where she was prescribed baclofen and Percocet and underwent bilateral L5/S1 transforaminal lumbar epidural steroid injections.  They work but she  has had recurrent pain.  She underwent L3-4 fusion on 06/15/16.    PAST MEDICAL HISTORY: Past Medical History:  Diagnosis Date  . Anemia   . Anxiety panic attacks   [name]  . Arthritis    knees  . Cardiomyopathy, nonischemic ([place]) 04/07/2016   EF left ventricule calculated on Lexiscan stress test at 33% with global hypokinesis.   . Chronic back pain   . Chronic neck pain   . Depression   . Dysrhythmia    pvc's with cardiomyopathy  . Family history of adverse reaction to anesthesia    mother has problems waking up  . Fibromyalgia    and neuropathy of feet  . History of kidney stones    lithotripsy  03/2016  . Hyperlipidemia   . Hypertension   . Kidney stone    History  . Kidney stones h/o  . Melanoma ([place]) RUE   [name]  . MGUS (monoclonal gammopathy of unknown significance) 06/2015   no treatment just watching recheck blood work 12/2015  . Migraine    OTC med prn - stress related  . Plantar fasciitis right  . Renal stones 2008/2011/2017  . Seasonal allergies   . SVD (spontaneous vaginal delivery)    x 3  . Tendonitis of foot right    MEDICATIONS: Current Outpatient Medications on File Prior to Visit  Medication Sig Dispense Refill  . buPROPion (WELLBUTRIN XL) 150 MG 24 hr tablet Take 150 mg by mouth daily.    . carvedilol (COREG) 6.25 MG tablet TAKE 1 (ONE) TABLET BY MOUTH TWO TIMES DAILY AS DIRECTEED  1  . cetirizine (ZYRTEC) 10 MG tablet Take 10 mg by mouth daily.    . diphenhydrAMINE (BENADRYL) 25 MG tablet Take 25-50 mg by mouth every 6 (six) hours as needed.    Marland Kitchen FLUoxetine (PROZAC) 20 MG capsule Take 60 mg by mouth daily.    Marland Kitchen lisinopril (PRINIVIL,ZESTRIL) 20 MG tablet Take 10 mg by mouth every evening.     Marland Kitchen LORazepam (ATIVAN) 0.5 MG tablet Take 1-2 tablets (0.5-1 mg total) by mouth 3 (three) times daily as needed for anxiety. Prn anxiety 30 tablet 0  . meclizine (ANTIVERT) 25 MG tablet Take 25 mg by mouth 3 (three) times daily as needed for dizziness.      . meloxicam (MOBIC) 15 MG tablet Take 15 mg by mouth daily.    . mirabegron ER (MYRBETRIQ) 50 MG TB24 tablet Take 50 mg by mouth daily.    Marland Kitchen MISC NATURAL PRODUCT OP Take 2 capsules by mouth daily. Omega-Q    . MISC NATURAL PRODUCTS PO Take 2 capsules by mouth 2 (two) times daily. Rejuvenixx    . MISC NATURAL PRODUCTS PO Take 1 capsule by mouth 2 (two) times daily. Optimal- V    . MISC NATURAL PRODUCTS PO Take 1 capsule by mouth 2 (two) times daily. Optimal-M    . MISC NATURAL PRODUCTS PO Take 1 capsule by mouth 2 (two) times daily. MagnaCal-D    . pregabalin (LYRICA) 75 MG capsule Take 1 capsule (75 mg total) by mouth 2 (two) times daily. (Patient not taking: Reported on 12/03/2017) 180 capsule 0  . pseudoephedrine (SUDAFED) 120 MG 12 hr tablet Take 120 mg by mouth 2 (two) times daily.    Marland Kitchen tiZANidine (ZANAFLEX) 2 MG tablet Take 2 mg by mouth every 6 (six) hours as needed for muscle spasms.    Marland Kitchen triamcinolone cream (KENALOG) 0.1 % Apply 1 application topically 2 (two) times daily. Apply sparingly to  affected areas as needed, for up to 2 weeks (Patient not taking: Reported on 12/03/2017) 45 g 0   No current facility-administered medications on file prior to visit.     ALLERGIES: Allergies  Allergen Reactions  . Penicillins Shortness Of Breath and Rash    Has patient had a PCN reaction causing immediate rash, facial/tongue/throat swelling, SOB or lightheadedness with hypotension: no Has patient had a PCN reaction causing severe rash involving mucus membranes or skin necrosis: no Has patient had a PCN reaction that required hospitalization no Has patient had a PCN reaction occurring within the last 10 years: no If all of the above answers are "NO", then may proceed with Cephalosporin use.   . Codeine Other (See Comments)    Manic depressive  . Voltaren [Diclofenac] Nausea And Vomiting and Other (See Comments)    Pt stated "tore stomach up"  . Erythromycin Rash    FAMILY HISTORY: Family  History  Problem Relation Age of Onset  . Hypertension Mother   . Depression Mother   . Migraines Mother   . Hypothyroidism Mother   . COPD Father   . Heart failure Father 44  . Breast cancer Cousin        40's  . Cancer Cousin        breast; in her 39's  . Healthy Brother   . Healthy Daughter   . Healthy Son   . Healthy Daughter   . Diabetes Neg Hx    SOCIAL HISTORY: Social History   Socioeconomic History  . Marital status: Married    Spouse name: Not on file  . Number of children: Not on file  . Years of education: Not on file  . Highest education level: Not on file  Occupational History  . Not on file  Social Needs  . Financial resource strain: Not on file  . Food insecurity:    Worry: Not on file    Inability: Not on file  . Transportation needs:    Medical: Not on file    Non-medical: Not on file  Tobacco Use  . Smoking status: Former Smoker    Packs/day: 0.10    Years: 3.00    Pack years: 0.30    Types: Cigarettes    Last attempt to quit: 07/04/1987    Years since quitting: 30.6  . Smokeless tobacco: Never Used  Substance and Sexual Activity  . Alcohol use: Yes    Alcohol/week: 0.0 standard drinks    Comment: rarely   . Drug use: No  . Sexual activity: Yes    Partners: Male    Birth control/protection: Other-see comments, Surgical    Comment: husband had vasectomy; pt had hysterectomy  Lifestyle  . Physical activity:    Days per week: Not on file    Minutes per session: Not on file  . Stress: Not on file  Relationships  . Social connections:    Talks on phone: Not on file    Gets together: Not on file    Attends religious service: Not on file    Active member of club or organization: Not on file    Attends meetings of clubs or organizations: Not on file    Relationship status: Not on file  . Intimate partner violence:    Fear of current or ex partner: Not on file    Emotionally abused: Not on file    Physically abused: Not on file    Forced  sexual activity: Not on file  Other Topics Concern  . Not on file  Social History Narrative   Married.  Lives with husband, 1 daughter (in college at [employer]), son; brother-in-law (with Down's syndrome) moved into Memory care 03/2016.    1 dog.   1 daughter in [place], [place].   Granddaughter in [place], [place] (with her mother, who re-married)   Disabled due to anxiety.  Previously worked as Public relations account executive.    REVIEW OF SYSTEMS: Constitutional: No fevers, chills, or sweats, no generalized fatigue, change in appetite Eyes: No visual changes, double vision, eye pain Ear, nose and throat: No hearing loss, ear pain, nasal congestion, sore throat Cardiovascular: No chest pain, palpitations Respiratory:  No shortness of breath at rest or with exertion, wheezes GastrointestinaI: No nausea, vomiting, diarrhea, abdominal pain, fecal incontinence Genitourinary:  No dysuria, urinary retention or frequency Musculoskeletal:  Back pain Integumentary: No rash, pruritus, skin lesions Neurological: as above Psychiatric: depression, anxiety Endocrine: No palpitations, fatigue, diaphoresis, mood swings, change in appetite, change in weight, increased thirst Hematologic/Lymphatic:  No purpura, petechiae. Allergic/Immunologic: no itchy/runny eyes, nasal congestion, recent allergic reactions, rashes  PHYSICAL EXAM: Blood pressure 116/82, pulse 79, height 5\' 6"  (1.676 m), weight 155 lb (70.3 kg), last menstrual period 12/16/2012, SpO2 97 %. General: No acute distress.  Patient appears well-groomed.  normal body habitus. Head:  Normocephalic/atraumatic Eyes:  Fundi examined but not visualized Neck: supple, no paraspinal tenderness, full range of motion Heart:  Regular rate and rhythm Lungs:  Clear to auscultation bilaterally Back: No paraspinal tenderness Neurological Exam: alert and oriented to person, place, and time. Attention span and concentration intact, recent and remote memory intact, fund of  knowledge intact.  Speech fluent and not dysarthric, language intact.  CN II-XII intact. Bulk and tone normal, muscle strength 5/5 throughout.  Sensation to pinprick reduced in feet up to just below ankles.  Vibration sensation intact.  Deep tendon reflexes 2+ throughout, toes downgoing.  Finger to nose and heel to shin testing intact.  Gait normal, Romberg negative.  IMPRESSION: Chronic migraine without aura, without status migrainosus, not intractable Small fiber neuropathy Lumbosacral radiculopathy  PLAN: 1.  I would like to start her on an anti-CGRP injection.  We will see what the copay would be for her.  If it is not economically feasible for her, then I recommend Botox. 2.  Tizanidine refilled.  Tylenol with tizanidine as needed for acute headache, limited to no more than 2 days out of week to prevent rebound headache 3.  Keep headache diary 4.  Follow up in 6 months.  Metta Clines, DO  CC: Rita Ohara, MD

## 2018-03-08 ENCOUNTER — Other Ambulatory Visit: Payer: Medicare Other

## 2018-03-12 NOTE — Progress Notes (Signed)
Chief Complaint  Patient presents with  . Medicare Wellness    fasting AWV/CPE with pelvic. Will give UA on way out. Had recent eye exam. Recently started going to see Dr.Max Cohen for injections, going again in a couple week-first one did not help. Had lithotripsy in Feb and another in couple weeks(preventative). Had colonoscopy in July. Mammogram last Nov. 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     Lindsey Rodriguez is a 52 y.o. female who presents for annual physical exam, Medicare wellness visit and follow-up on chronic medical conditions.    She sees Dr. Tomi Likens for her headaches, last saw him last week.  She uses Tylenol and Tizanidine prn. She stopped her Lyrica back in March (memory concerns, improved after stopping), and stopped Depakote last year (caused some hair loss).  He recommended trial of anti-CGRP injection. If it is not economically feasible for her, Botox was recommended. She has not yet heard back about this.  Headaches 4-5/week, only one that is incapacitating (takes lorazepam, tizanidine, ice packs, and goes to bed).  Chronic back (and neck) pain.  Previously was under the care of Dr. Vertell Limber, switched to Dr. Towanda Malkin practice (NP [facility]). S/p back surgery in 06/2016 by Dr. Vertell Limber (R L3-4 fusion). CT myelogram was done last month (see below), which revealed progressed damage at L2.  She underwent bilateral epidural injection at L2 which worked for two days (as reported in Dr. Georgie Chard notes; no records received).  She had f/u with [facility] yesterday and is scheduled for another injection soon.  Her pain is across the lower back, down the backs of both legs.  She has tingling in her posterior thighs (and feet, has neuropathy) and when she is tired notices some left footdrop. She reports having fallen a couple of times, which she relates to this footdrop.  IMPRESSION: LUMBAR MYELOGRAM IMPRESSION: Solid L3-S1 fusion construct. 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 No dynamic instability.  CT LUMBAR MYELOGRAM IMPRESSION: Solid arthrodesis L3-S1. Residual foraminal narrowing at L3-4 on the LEFT, with heterotopic ossification extending into the LEFT psoas, uncertain significance. 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 Severe stenosis, with LEFT greater than RIGHT L2 and L3 neural impingement.  She had developed neurogenic bladder s/p hysterectomy and bladder tack (11/2015).She was started on Myrbetriq by Dr. Philis Pique, continued by urologist (Dr. Delfino Lovett Puschinsky, in [facility]). She continues on Myrbetriq, doing well overall. Has some occasional incontinence if she lets her bladder get very full (urge incontinence).  She also sees him for kidney stones. She had lithotripsy on the left in February, scheduled for right in the near future.  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 Last seen in June, due again in December.  She is under the care of Dr. Estanislado Pandy for chondrocalcinosis, OA, DJD, and fibromyalgia. She last saw her in June, no changes made.  She continues to see [name] for depression and anxiety and reports that her moods are good. She continues on Prozac, Wellbutrin was stopped in the Spring, and she continues to do well.  Hyperlipidemia: [name] lowfat diet. She previously took fenofibrate for high TG, but lipids remained good after a trial off medication.  Last check had normal TG, but LDL was significantly higher than the year prior. She was given info on low cholesterol diet. She hasn't made any changes, but feels like she follows a low cholesterol diet.  No regular eggs,  just sporadic. Some cheese, red meat 2-3 times/week.  She is due for recheck.  Lab Results  Component Value Date   CHOL 241 (H) 03/08/2017   HDL 73 03/08/2017   LDLCALC 144 (H) 03/08/2017   TRIG 118 03/08/2017   CHOLHDL 3.3 03/08/2017     RecentLabs       Lab Results  Component Value Date   CHOL 190 05/03/2016   HDL 65 05/03/2016   LDLCALC 104 05/03/2016   TRIG 103 05/03/2016   CHOLHDL 2.9 05/03/2016     [name], but Lotrel was stopped after her weight loss.  She is now under the care of Dr. Nadyne Coombes for cardiomyopathy, and is on lisinopril and carvedilol. She last saw Dr. Einar Gip in November.  Reportedly had a f/u Holter and echo in 06/2017, results not in system, reports they were fine (EF up to 55% per pt). Overall her EF improved, no e/o CHF, doing well.  Due to see him again in November (yearly). BP's have been running 105-120's/60's. She does report occasional dizziness, not related to activities.  She denies chest pain, palpitations.  She continues to follow "the next 56 days",  since 08/2015, low carb, "good fats", lean protein, lots of vegetables and water (tries for 90-100 oz daily). Cut out pork, soy, wheat, corn, potatoes and beets.  Her goal is to maintain 150#.   Immunization History  Administered Date(s) Administered  . Influenza Split 03/18/2011, 04/02/2014, 05/01/2014  . Influenza Whole 07/03/2009  . Influenza, Seasonal, Injecte, Preservative Fre 06/04/2012  . Influenza,inj,Quad PF,6+ Mos 03/10/2013, 04/07/2015, 03/02/2016, 03/08/2017  . Pneumococcal Conjugate-13 03/08/2017  . Tdap 09/05/2012   Last Pap smear: per Dr. Philis Pique, approx 10/2014; s/p hysterectomy (for benign reasons) Last mammogram: 05/2017 Last colonoscopy: 12/2017, normal (previously had polyp); 5 yr f/u rec per Dr. Collene Mares Last DEXA: never Dentist: twice yearly Ophtho: yearly Exercise: Body Groove Dancercise video (23-43 minutes, varies), at least 3x/week.  Uses some resistance bands or light handweights with the workout. Vitamin D last checked here 02/2016, normal at 35.  On same supplements.  Other doctors caring for patient include: Neuro: Dr. Tomi Likens GI: Dr. Collene Mares GYN: Dr. Philis Pique (no longer  sees) Cardiologist: Dr. Einar Gip Oncologist: Dr. Irene Limbo Podiatrist: Dr. Paulla Dolly (no longer sees) Rheumatologist: Dr. Estanislado Pandy Neurosurg: Dr. Rennis Harding (also has seen Dr. Vertell Limber) Dermatologist: [name] Urologist: Dr. Harlow Asa (H.Pt) Psych: [name] Ophtho: Darlina Rumpf Dentist: Dr. Gabriela Eves   Depression screen:  negative Fall screen: 2 falls, no injuries (was fatigued and had left foot drop, per pt) Functional status survey: remarkable for difficulty with concentration/memory--only sometimes; memory improved a lot since stopping Lyrica, still has some issue with concentration/focusing (doesn't know if related to when Wellbutrin was stopped or not); some trouble with walking stairs (due to back), leakage of urine (infrequent urge incont.) Mini-cog screen: normal (score of 5)  See full questionnaires in epic   End of Life Discussion:  Patient has a living will and medical power of attorney  Past Medical History:  Diagnosis Date  . Anemia   . Anxiety panic attacks   [name]  . Arthritis    knees  . Cardiomyopathy, nonischemic ([place]) 04/07/2016   EF left ventricule calculated on Lexiscan stress test at 33% with global hypokinesis.   . Chronic back pain   . Chronic neck pain   . Depression   . Dysrhythmia    pvc's with cardiomyopathy  . Family history of adverse reaction to anesthesia  mother has problems waking up  . Fibromyalgia    and neuropathy of feet  . History of kidney stones    lithotripsy  03/2016  . Hyperlipidemia   . Hypertension   . Kidney stone    History  . Kidney stones h/o  . Melanoma ([place]) RUE   [name]  . MGUS (monoclonal gammopathy of unknown significance) 06/2015   no treatment just watching recheck blood work 12/2015  . Migraine    OTC med prn - stress related  . Plantar fasciitis right  . Renal stones 2008/2011/2017  . Seasonal allergies   . SVD (spontaneous vaginal delivery)    x 3  . Tendonitis of foot right    Past Surgical History:   Procedure Laterality Date  . ABDOMINAL HYSTERECTOMY    . ABLATION ON ENDOMETRIOSIS N/A 11/24/2015   Procedure: ABLATION ON ENDOMETRIOSIS;  Surgeon: [name], MD;  Location: [facility];  Service: Gynecology;  Laterality: N/A;  . ANTERIOR LAT LUMBAR FUSION Right 06/15/2016   Procedure: RIGHT LUMBAR THREE-FOUR ANTEROLATERAL LUMBAR INTERBODY FUSION WITH LATERAL PLATE;  Surgeon: Erline Levine, MD;  Location: [facility];  Service: Neurosurgery;  Laterality: Right;  . BACK SURGERY  Dr.Stern, last sx Summer 2011   X 4  (L3-S1)  . BACK SURGERY  06/2016  . BLADDER SUSPENSION N/A 11/24/2015   Procedure: TRANSVAGINAL TAPE (TVT) PROCEDURE;  Surgeon: [name], MD;  Location: [facility];  Service: Gynecology;  Laterality: N/A;  . COLONOSCOPY    . CYSTOCELE REPAIR  11/24/2015   Procedure: ANTERIOR REPAIR (CYSTOCELE);  Surgeon: [name], MD;  Location: [facility];  Service: Gynecology;;  . Consuela Mimes N/A 11/24/2015   Procedure: CYSTOSCOPY;  Surgeon: [name], MD;  Location: [facility];  Service: Gynecology;  Laterality: N/A;  . DILATION AND CURETTAGE OF UTERUS    . ENDOMETRIAL ABLATION  08/2000  . EYE SURGERY Bilateral    Lasik   . KIDNEY STONE SURGERY  2011  . LITHOTRIPSY  08/2017  . MELANOMA EXCISION  05/2015   right forearm; [name]  . NECK SURGERY  [name]  Summer 2011   X 4  (C4-T1 Level)  . RHINOPLASTY    . ROBOTIC ASSISTED BILATERAL SALPINGO OOPHERECTOMY Bilateral 11/24/2015   Procedure: ROBOTIC ASSISTED BILATERAL SALPINGO OOPHORECTOMY;  Surgeon: [name], MD;  Location: [facility];  Service: Gynecology;  Laterality: Bilateral;--OVARIES NOT REMOVED PER PATH REPORT  . ROBOTIC ASSISTED TOTAL HYSTERECTOMY WITH SALPINGECTOMY Bilateral 11/24/2015   Procedure: ROBOTIC ASSISTED TOTAL HYSTERECTOMY WITH SALPINGECTOMY;  Surgeon: [name], MD;  Location: [facility];  Service: Gynecology;  Laterality: Bilateral;  . TONSILLECTOMY    . WISDOM TOOTH EXTRACTION      Social History    Socioeconomic History  . Marital status: Married    Spouse name: Not on file  . Number of children: Not on file  . Years of education: Not on file  . Highest education level: Not on file  Occupational History  . Not on file  Social Needs  . Financial resource strain: Not on file  . Food insecurity:    Worry: Not on file    Inability: Not on file  . Transportation needs:    Medical: Not on file    Non-medical: Not on file  Tobacco Use  . Smoking status: Former Smoker    Packs/day: 0.10    Years: 3.00    Pack years: 0.30    Types: Cigarettes    Last attempt to quit: 07/04/1987  Years since quitting: 30.7  . Smokeless tobacco: Never Used  Substance and Sexual Activity  . Alcohol use: Yes    Alcohol/week: 0.0 standard drinks    Comment: rarely   . Drug use: No  . Sexual activity: Yes    Partners: Male    Birth control/protection: Other-see comments, Surgical    Comment: husband had vasectomy; pt had hysterectomy  Lifestyle  . Physical activity:    Days per week: Not on file    Minutes per session: Not on file  . Stress: Not on file  Relationships  . Social connections:    Talks on phone: Not on file    Gets together: Not on file    Attends religious service: Not on file    Active member of club or organization: Not on file    Attends meetings of clubs or organizations: Not on file    Relationship status: Not on file  . Intimate partner violence:    Fear of current or ex partner: Not on file    Emotionally abused: Not on file    Physically abused: Not on file    Forced sexual activity: Not on file  Other Topics Concern  . Not on file  Social History Narrative   Married.  Lives with husband, 1 daughter (in college at [employer]), son; brother-in-law (with Down's syndrome) , had a stroke, in SNF   1 dog.   1 daughter in [place], [place].   Granddaughter in [place], [place] (with her mother, who re-married)   Disabled due to anxiety.  Previously worked as Public relations account executive.    Coaching for [employer]    Family History  Problem Relation Age of Onset  . Hypertension Mother   . Depression Mother   . Migraines Mother   . Hypothyroidism Mother   . COPD Father   . Heart failure Father 70  . Breast cancer Cousin        40's  . Cancer Cousin        breast; in her 70's  . Healthy Brother   . Healthy Daughter   . Healthy Son   . Healthy Daughter   . Diabetes Neg Hx     Outpatient Encounter Medications as of 03/13/2018  Medication Sig Note  . carvedilol (COREG) 6.25 MG tablet TAKE 1 (ONE) TABLET BY MOUTH TWO TIMES DAILY AS DIRECTEED   . FLUoxetine (PROZAC) 20 MG capsule Take 60 mg by mouth daily.   Marland Kitchen lisinopril (PRINIVIL,ZESTRIL) 20 MG tablet Take 10 mg by mouth every evening.    Marland Kitchen LORazepam (ATIVAN) 0.5 MG tablet Take 1-2 tablets (0.5-1 mg total) by mouth 3 (three) times daily as needed for anxiety. Prn anxiety 03/13/2018: Uses for headaches and anxiety, prn, about 2x/week  . mirabegron ER (MYRBETRIQ) 50 MG TB24 tablet Take 50 mg by mouth daily.   Marland Kitchen MISC NATURAL PRODUCT OP Take 2 capsules by mouth daily. Omega-Q   . MISC NATURAL PRODUCTS PO Take 2 capsules by mouth 2 (two) times daily. Rejuvenixx   . MISC NATURAL PRODUCTS PO Take 1 capsule by mouth 2 (two) times daily. Optimal- V   . MISC NATURAL PRODUCTS PO Take 1 capsule by mouth 2 (two) times daily. Optimal-M   . MISC NATURAL PRODUCTS PO Take 1 capsule by mouth 2 (two) times daily. MagnaCal-D 06/20/2017: 2 capsules twice daily  . tiZANidine (ZANAFLEX) 4 MG tablet Take 1 tablet (4 mg total) by mouth every 6 (six) hours as needed for muscle  spasms.    No facility-administered encounter medications on file as of 03/13/2018.     Allergies  Allergen Reactions  . Penicillins Shortness Of Breath and Rash    Has patient had a PCN reaction causing immediate rash, facial/tongue/throat swelling, SOB or lightheadedness with hypotension: no Has patient had a PCN reaction causing severe rash involving mucus  membranes or skin necrosis: no Has patient had a PCN reaction that required hospitalization no Has patient had a PCN reaction occurring within the last 10 years: no If all of the above answers are "NO", then may proceed with Cephalosporin use.   . Codeine Other (See Comments)    Manic depressive  . Voltaren [Diclofenac] Nausea And Vomiting and Other (See Comments)    Pt stated "tore stomach up"  . Erythromycin Rash    ROS: The patient denies anorexia, fever, vision changes, decreased hearing, ear pain, sore throat, breast concerns, chest pain, palpitations, syncope (none since 08/2016), dyspnea on exertion, cough, swelling, nausea, vomiting, diarrhea, constipation, abdominal pain, melena, hematochezia, indigestion/heartburn, hematuria, dysuria, vaginal bleeding, discharge, odor or itch, genital lesions, tremor, suspicious skin lesions, depression, anxiety, abnormal bleeding/bruising, or enlarged lymph nodes. +urinary urgency and urge incontinence--infrequent, doing well on myrbetriq Moods are well controlled. Neuropathy in feet, no worse since stopping lyrica She has neck pain (mainly right sided), for which she takes the tizanidine prn Headaches per HPI Back pain per HPI Occasional nosebleed, and some allergies/congestion. Occasional dizziness per HPI   PHYSICAL EXAM:  BP 108/64   Pulse (!) 56   Ht 5\' 6"  (1.676 m)   Wt 154 lb 9.6 oz (70.1 kg)   LMP 12/16/2012   BMI 24.95 kg/m   Wt Readings from Last 3 Encounters:  03/13/18 154 lb 9.6 oz (70.1 kg)  03/05/18 155 lb (70.3 kg)  12/19/17 159 lb 1.6 oz (72.2 kg)    General Appearance:  Alert, cooperative, no distress, appears stated age.  Head:  Normocephalic, without obvious abnormality, atraumatic, nontender  Eyes:  PERRL, conjunctiva/corneas clear, EOM's intact, fundi benign  Ears:  Normal TM's and external ear canals  Nose: Nares normal, mucosa is mildly edematous, some crusting noted, no erythema. Mildly tender  over maxillary sinuses bilaterally  Throat: Lips, mucosa, and tongue normal; teeth and gums normal  Neck: Supple, no lymphadenopathy; thyroid: no enlargement/ tenderness/nodules; no carotid bruit or JVD  Back:  Diffusely tender down spine, paraspinous muscles (with some prominence/spasm on right thoracic area).  Also tender over CVA--pain with palpation entire back.   Lungs:  Clear to auscultation bilaterally without wheezes, rales or ronchi; respirations unlabored  Chest Wall:  No tenderness or deformity  Heart:  regular rate and rhythm,no murmur, rub or gallop  Breast Exam:  no nipple inversion, discharge, skin dimpling, breast masses or axillary lymphadenopathy. Fibroglandular changes and mild tenderness noted R>L  Abdomen:  Soft, non-tender, nondistended, normoactive bowel sounds, no masses, no hepatosplenomegaly  Genitalia:  normal external genitalia without lesions.  Normal bimanual exam.  Uterus surgically absent. No adnexal masses or tenderness  Rectal: Not performed (had recent colonoscopy)  Extremities: No clubbing, cyanosis or edema. High arches  Pulses: 2+ and symmetric all extremities  Skin: Skin color, texture, turgor normal, no rashes or lesions. Excoriations vertically down shins (from using dull razor recently, per pt)  Lymph nodes: Cervical, supraclavicular, and axillary nodes normal  Neurologic: CNII-XII intact, normal strength, sensation and gait; reflexes 2+ and symmetric  Psych: Normal mood, affect, hygiene and grooming. Speaks slowly, pauses before speaking, very  deliberate.     Chemistry      Component Value Date/Time   NA 135 (L) 12/19/2017 0837   NA 136 06/20/2017 0857   K 4.3 12/19/2017 0837   K 4.4 06/20/2017 0857   CL 103 12/19/2017 0837   CO2 26 12/19/2017 0837   CO2 25 06/20/2017 0857   BUN 19 12/19/2017 0837   BUN 11.2 06/20/2017 0857   CREATININE 0.81 12/19/2017 0837   CREATININE 0.9 06/20/2017 0857       Component Value Date/Time   CALCIUM 9.8 12/19/2017 0837   CALCIUM 9.9 06/20/2017 0857   ALKPHOS 71 12/19/2017 0837   ALKPHOS 64 06/20/2017 0857   AST 18 12/19/2017 0837   AST 21 06/20/2017 0857   ALT 17 12/19/2017 0837   ALT 19 06/20/2017 0857   BILITOT 0.4 12/19/2017 0837   BILITOT 0.51 06/20/2017 0857     Lab Results  Component Value Date   WBC 5.7 12/19/2017   HGB 13.3 12/19/2017   HCT 38.7 12/19/2017   MCV 97.2 12/19/2017   PLT 280 12/19/2017   Lab Results  Component Value Date   TSH 1.22 07/06/2017   Many other labs also checked through hematologist, and rheum, recently and in January, normal. See epic.   ASSESSMENT/PLAN:  Annual physical exam - Plan: NMR, lipoprofile, Glucose, random, POCT Urinalysis DIP (Proadvantage Device)  Medicare annual wellness visit, initial  Mixed hyperlipidemia - reviewed lowfat, low cholesterol diet. Due for recheck; she is requesting NMR profile - Plan: NMR, lipoprofile  Chronic migraine without aura without status migrainosus, not intractable - stable  MGUS (monoclonal gammopathy of unknown significance) - stable  Fibromyalgia - continues to have chronic pain, stable; not significantly worse since cutting back on meds  Nonischemic cardiomyopathy ([place]) - improved. To discuss dizziness at f/u with Dr. Einar Gip. no e/o CHF  Neuropathy - stable, off lyrica  Need for influenza vaccination - Plan: Flu Vaccine QUAD 6+ mos PF IM (Fluarix Quad PF)  Spinal stenosis of lumbar region, unspecified whether neurogenic claudication present - plans for second epidural in near future  At high risk for falls - has fallen twice in last year, no injuries. She relates to foot drop/weakness when fatigued. To discuss with ortho-spine--AFO recommended   Lipids, glu today, other labs UTD. She is requesting NMR profile  AFO for foot drop to prevent falls.  Discussed monthly self breast exams and yearly mammograms; at least 30 minutes of aerobic  activity at least 5 days/week, weight-bearing exercise at least 2x/wk; proper sunscreen use reviewed; healthy diet, including goals of calcium and vitamin D intake and alcohol recommendations (less than or equal to 1 drink/day) reviewed; regular seatbelt use; changing batteries in smoke detectors. Immunization recommendations discussed--flu shot given today. Shingrix recommended needs to get from pharmacy [facility]). Pneumovax age 1.Colonoscopy recommendations reviewed, UTD, due again 01/2023.  Pt to f/u in 1 year for CPE, sooner prn.   Medicare Attestation I have personally reviewed: The patient's medical and social history Their use of alcohol, tobacco or illicit drugs Their current medications and supplements The patient's functional ability including ADLs,fall risks, home safety risks, cognitive, and hearing and visual impairment Diet and physical activities Evidence for depression or mood disorders  The patient's weight, height and BMI have been recorded in the chart.  I have made referrals, counseling, and provided education to the patient based on review of the above and I have provided the patient with a written personalized care plan for preventive services.

## 2018-03-13 ENCOUNTER — Telehealth: Payer: Self-pay

## 2018-03-13 ENCOUNTER — Ambulatory Visit: Payer: Medicare Other | Admitting: Family Medicine

## 2018-03-13 ENCOUNTER — Encounter: Payer: Self-pay | Admitting: Family Medicine

## 2018-03-13 VITALS — BP 108/64 | HR 56 | Ht 66.0 in | Wt 154.6 lb

## 2018-03-13 DIAGNOSIS — E782 Mixed hyperlipidemia: Secondary | ICD-10-CM | POA: Diagnosis not present

## 2018-03-13 DIAGNOSIS — D472 Monoclonal gammopathy: Secondary | ICD-10-CM | POA: Diagnosis not present

## 2018-03-13 DIAGNOSIS — Z9181 History of falling: Secondary | ICD-10-CM

## 2018-03-13 DIAGNOSIS — G629 Polyneuropathy, unspecified: Secondary | ICD-10-CM

## 2018-03-13 DIAGNOSIS — M797 Fibromyalgia: Secondary | ICD-10-CM

## 2018-03-13 DIAGNOSIS — Z23 Encounter for immunization: Secondary | ICD-10-CM | POA: Diagnosis not present

## 2018-03-13 DIAGNOSIS — I428 Other cardiomyopathies: Secondary | ICD-10-CM

## 2018-03-13 DIAGNOSIS — Z0000 Encounter for general adult medical examination without abnormal findings: Secondary | ICD-10-CM | POA: Diagnosis not present

## 2018-03-13 DIAGNOSIS — M48061 Spinal stenosis, lumbar region without neurogenic claudication: Secondary | ICD-10-CM

## 2018-03-13 DIAGNOSIS — G43709 Chronic migraine without aura, not intractable, without status migrainosus: Secondary | ICD-10-CM | POA: Diagnosis not present

## 2018-03-13 LAB — POCT URINALYSIS DIP (PROADVANTAGE DEVICE)
Bilirubin, UA: NEGATIVE
Blood, UA: NEGATIVE
Glucose, UA: NEGATIVE mg/dL
Ketones, POC UA: NEGATIVE mg/dL
Leukocytes, UA: NEGATIVE
Nitrite, UA: NEGATIVE
Protein Ur, POC: NEGATIVE mg/dL
Specific Gravity, Urine: 1.01
Urobilinogen, Ur: NEGATIVE
pH, UA: 6 (ref 5.0–8.0)

## 2018-03-13 NOTE — Patient Instructions (Addendum)
  HEALTH MAINTENANCE RECOMMENDATIONS:  It is recommended that you get at least 30 minutes of aerobic exercise at least 5 days/week (for weight loss, you may need as much as 60-90 minutes). This can be any activity that gets your heart rate up. This can be divided in 10-15 minute intervals if needed, but try and build up your endurance at least once a week.  Weight bearing exercise is also recommended twice weekly.  Eat a healthy diet with lots of vegetables, fruits and fiber.  "Colorful" foods have a lot of vitamins (ie green vegetables, tomatoes, red peppers, etc).  Limit sweet tea, regular sodas and alcoholic beverages, all of which has a lot of calories and sugar.  Up to 1 alcoholic drink daily may be beneficial for women (unless trying to lose weight, watch sugars).  Drink a lot of water.  Calcium recommendations are 1200-1500 mg daily (1500 mg for postmenopausal women or women without ovaries), and vitamin D 1000 IU daily.  This should be obtained from diet and/or supplements (vitamins), and calcium should not be taken all at once, but in divided doses.  Monthly self breast exams and yearly mammograms for women over the age of 37 is recommended.  Sunscreen of at least SPF 30 should be used on all sun-exposed parts of the skin when outside between the hours of 10 am and 4 pm (not just when at beach or pool, but even with exercise, golf, tennis, and yard work!)  Use a sunscreen that says "broad spectrum" so it covers both UVA and UVB rays, and make sure to reapply every 1-2 hours.  Remember to change the batteries in your smoke detectors when changing your clock times in the spring and fall.  Use your seat belt every time you are in a car, and please drive safely and not be distracted with cell phones and texting while driving.    Lindsey Rodriguez , Thank you for taking time to come for your Medicare Wellness Visit. I appreciate your ongoing commitment to your health goals. Please review the  following plan we discussed and let me know if I can assist you in the future.   These are the goals we discussed: Goals   None     This is a list of the screening recommended for you and due dates:  Health Maintenance  Topic Date Due  . HIV Screening  06/22/1980  . Flu Shot  01/31/2018  . Mammogram  05/19/2019  . Tetanus Vaccine  09/06/2022  . Colon Cancer Screening  01/22/2028   We gave you a flu shot today. Next mammogram is due 05/2018, not 2020 as stated above. Next colonoscopy is due 01/2023, not 2029 (5 year follow-up).  I recommend getting the new shingles vaccine (Shingrix). You will need to check with your insurance to see if it is covered, and if covered by Medicare Part D, you need to get from the pharmacy rather than our office.  It is a series of 2 injections, spaced 2 months apart.  It is important that you have AFO for foot drop if this is causing falls.  We need to prevent all falls.

## 2018-03-13 NOTE — Telephone Encounter (Signed)
Patient has scheduled her physical for 1 year. She has also scheduled to have labs done on 03/11/2018. Please place lab orders prior to patient arrival.

## 2018-03-13 NOTE — Telephone Encounter (Signed)
I cannot put labs in this early. She has many doctors ordering tests on her, and I will need to look closer to her visit to see what (other than cholesterol) may be needed.  She will need to contact us prior to that visit for orders (or NOT schedule it now, and have her set reminder to call us for labs prior, if she still wants them done prior to her appointment--that way I can look to see what has been done by others in order to know exactly what to order).

## 2018-03-14 LAB — NMR, LIPOPROFILE
Cholesterol, Total: 230 mg/dL — ABNORMAL HIGH (ref 100–199)
HDL Particle Number: 41.2 umol/L (ref >=30.5)
HDL-C: 71 mg/dL (ref >39)
LDL Particle Number: 1742 nmol/L — ABNORMAL HIGH (ref <1000)
LDL Size: 21.4 nm (ref >20.5)
LDL-C: 140 mg/dL — ABNORMAL HIGH (ref 0–99)
LP-IR Score: 51 — ABNORMAL HIGH (ref <=45)
Small LDL Particle Number: 333 nmol/L (ref <=527)
Triglycerides: 93 mg/dL (ref 0–149)

## 2018-03-14 LAB — GLUCOSE, RANDOM: Glucose: 80 mg/dL (ref 65–99)

## 2018-03-15 ENCOUNTER — Encounter: Payer: Self-pay | Admitting: Family Medicine

## 2018-03-19 ENCOUNTER — Telehealth: Payer: Self-pay

## 2018-03-19 MED ORDER — ERENUMAB-AOOE 70 MG/ML ~~LOC~~ SOAJ: 70.0000 mg | SUBCUTANEOUS | 11 refills | Status: DC

## 2018-03-19 NOTE — Telephone Encounter (Signed)
Called [facility], spoke with Heather, all 3 CGRP's require PA. Will move forward with Aimovig.

## 2018-04-12 ENCOUNTER — Telehealth: Payer: Self-pay

## 2018-04-12 NOTE — Telephone Encounter (Signed)
Received notice via CoverMyMeds  AIMOVIG INJ 70MG /ML is approved through 07/13/2018

## 2018-04-16 ENCOUNTER — Other Ambulatory Visit: Payer: Self-pay | Admitting: Family Medicine

## 2018-04-16 DIAGNOSIS — Z1231 Encounter for screening mammogram for malignant neoplasm of breast: Secondary | ICD-10-CM

## 2018-05-23 ENCOUNTER — Ambulatory Visit
Admission: RE | Admit: 2018-05-23 | Discharge: 2018-05-23 | Disposition: A | Discharge status: Home / Self Care | Payer: Medicare Other | Source: Ambulatory Visit | Attending: Family Medicine | Admitting: Family Medicine

## 2018-05-23 DIAGNOSIS — Z1231 Encounter for screening mammogram for malignant neoplasm of breast: Secondary | ICD-10-CM

## 2018-05-24 NOTE — Progress Notes (Deleted)
Office Visit Note  Patient: Lindsey Rodriguez             Date of Birth: September 15, 1965           MRN: [id number]             PCP: Rita Ohara, MD Referring: Rita Ohara, MD Visit Date: 06/06/2018 Occupation: @GUAROCC @  Subjective:  No chief complaint on file.   History of Present Illness: Lindsey Rodriguez is a 52 y.o. female ***   Activities of Daily Living:  Patient reports morning stiffness for *** {minute/hour:19697}.   Patient {ACTIONS;DENIES/REPORTS:21021675::"Denies"} nocturnal pain.  Difficulty dressing/grooming: {ACTIONS;DENIES/REPORTS:21021675::"Denies"} Difficulty climbing stairs: {ACTIONS;DENIES/REPORTS:21021675::"Denies"} Difficulty getting out of chair: {ACTIONS;DENIES/REPORTS:21021675::"Denies"} Difficulty using hands for taps, buttons, cutlery, and/or writing: {ACTIONS;DENIES/REPORTS:21021675::"Denies"}  No Rheumatology ROS completed.   PMFS History:  Patient Active Problem List   Diagnosis Date Noted  . Lumbar scoliosis 06/15/2016  . Chondrocalcinosis 05/15/2016  . Primary osteoarthritis of both knees 05/15/2016  . Tendinopathy of right shoulder 05/15/2016  . Spondylosis of lumbar region 05/15/2016  . Plantar fasciitis, bilateral 05/15/2016  . Renal calcinosis 05/15/2016  . Malignant melanoma of right upper extremity including shoulder ([place]) 05/15/2016  . Nonischemic cardiomyopathy ([place]) 04/10/2016  . Post op infection 12/18/2015  . Postoperative state 11/24/2015  . Chronic migraine without aura without status migrainosus, not intractable 08/04/2015  . Fibromyalgia 07/26/2015  . MGUS (monoclonal gammopathy of unknown significance) 06/10/2015  . DJD (degenerative joint disease), cervical 03/02/2015  . Migraine headache 09/05/2012  . Fatigue 10/26/2011  . Pure hyperglyceridemia 04/27/2011  . Essential hypertension, benign 04/17/2011  . Vitamin D deficiency 04/17/2011  . Neuropathy 12/22/2010  . Headache 12/22/2010    Past Medical History:  Diagnosis  Date  . Anemia   . Anxiety panic attacks   [name]  . Arthritis    knees  . Cardiomyopathy, nonischemic ([place]) 04/07/2016   EF left ventricule calculated on Lexiscan stress test at 33% with global hypokinesis.   . Chronic back pain   . Chronic neck pain   . Depression   . Dysrhythmia    pvc's with cardiomyopathy  . Family history of adverse reaction to anesthesia    mother has problems waking up  . Fibromyalgia    and neuropathy of feet  . History of kidney stones    lithotripsy  03/2016  . Hyperlipidemia   . Hypertension   . Kidney stone    History  . Kidney stones h/o  . Melanoma ([facility]) RUE   [name]  . MGUS (monoclonal gammopathy of unknown significance) 06/2015   no treatment just watching recheck blood work 12/2015  . Migraine    OTC med prn - stress related  . Plantar fasciitis right  . Renal stones 2008/2011/2017  . Seasonal allergies   . SVD (spontaneous vaginal delivery)    x 3  . Tendonitis of foot right    Family History  Problem Relation Age of Onset  . Hypertension Mother   . Depression Mother   . Migraines Mother   . Hypothyroidism Mother   . COPD Father   . Heart failure Father 87  . Healthy Brother   . Healthy Daughter   . Healthy Son   . Healthy Daughter   . Diabetes Neg Hx    Past Surgical History:  Procedure Laterality Date  . ABDOMINAL HYSTERECTOMY    . ABLATION ON ENDOMETRIOSIS N/A 11/24/2015   Procedure: ABLATION ON ENDOMETRIOSIS;  Surgeon: [name], MD;  Location: [facility];  Service: Gynecology;  Laterality: N/A;  . ANTERIOR LAT LUMBAR FUSION Right 06/15/2016   Procedure: RIGHT LUMBAR THREE-FOUR ANTEROLATERAL LUMBAR INTERBODY FUSION WITH LATERAL PLATE;  Surgeon: Erline Levine, MD;  Location: [facility];  Service: Neurosurgery;  Laterality: Right;  . BACK SURGERY  Dr.Stern, last sx Summer 2011   X 4  (L3-S1)  . BACK SURGERY  06/2016  . BLADDER SUSPENSION N/A 11/24/2015   Procedure: TRANSVAGINAL TAPE (TVT) PROCEDURE;  Surgeon: [name], MD;  Location: [facility];  Service: Gynecology;  Laterality: N/A;  . COLONOSCOPY    . CYSTOCELE REPAIR  11/24/2015   Procedure: ANTERIOR REPAIR (CYSTOCELE);  Surgeon: [name], MD;  Location: [facility];  Service: Gynecology;;  . Consuela Mimes N/A 11/24/2015   Procedure: CYSTOSCOPY;  Surgeon: [name], MD;  Location: [facility];  Service: Gynecology;  Laterality: N/A;  . DILATION AND CURETTAGE OF UTERUS    . ENDOMETRIAL ABLATION  08/2000  . EYE SURGERY Bilateral    Lasik   . KIDNEY STONE SURGERY  2011  . LITHOTRIPSY  08/2017  . MELANOMA EXCISION  05/2015   right forearm; [name]  . NECK SURGERY  [name]  Summer 2011   X 4  (C4-T1 Level)  . RHINOPLASTY    . ROBOTIC ASSISTED BILATERAL SALPINGO OOPHERECTOMY Bilateral 11/24/2015   Procedure: ROBOTIC ASSISTED BILATERAL SALPINGO OOPHORECTOMY;  Surgeon: [name], MD;  Location: [facility];  Service: Gynecology;  Laterality: Bilateral;--OVARIES NOT REMOVED PER PATH REPORT  . ROBOTIC ASSISTED TOTAL HYSTERECTOMY WITH SALPINGECTOMY Bilateral 11/24/2015   Procedure: ROBOTIC ASSISTED TOTAL HYSTERECTOMY WITH SALPINGECTOMY;  Surgeon: [name], MD;  Location: [facility];  Service: Gynecology;  Laterality: Bilateral;  . TONSILLECTOMY    . WISDOM TOOTH EXTRACTION     Social History   Social History Narrative   Married.  Lives with husband, 1 daughter (in college at [employer]), son; brother-in-law (with Down's syndrome) , had a stroke, in SNF   1 dog.   1 daughter in [place], [place].   Granddaughter in [place], [place] (with her mother, who re-married)   Disabled due to anxiety.  Previously worked as Public relations account executive.   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 Go to the Rheumatology activity and complete the homunculus joint exam.  Investigation: No additional findings.  Imaging: Mm 3d Screen Breast Bilateral  Result Date: 05/24/2018 CLINICAL DATA:  Screening. EXAM: DIGITAL SCREENING BILATERAL MAMMOGRAM WITH TOMO AND CAD COMPARISON:  Previous exam(s). ACR Breast Density Category c: The breast tissue is heterogeneously dense, which may obscure small masses. FINDINGS: There are no findings suspicious for malignancy. Images were processed with CAD. IMPRESSION: No mammographic evidence of malignancy. A result letter of this screening mammogram will be mailed directly to the patient. RECOMMENDATION: Screening mammogram in one year. (Code:SM-B-01Y) BI-RADS CATEGORY  1: Negative. Electronically Signed   By: Everlean Alstrom M.D.   On: 05/24/2018 08:04    Recent Labs: Lab Results  Component Value Date   WBC 5.7 12/19/2017   HGB 13.3 12/19/2017   PLT 280 12/19/2017   NA 135 (L) 12/19/2017   K 4.3 12/19/2017   CL 103 12/19/2017   CO2 26 12/19/2017  GLUCOSE 80 03/13/2018   BUN 19 12/19/2017   CREATININE 0.81 12/19/2017   BILITOT 0.4 12/19/2017   ALKPHOS 71 12/19/2017   AST 18 12/19/2017   ALT 17 12/19/2017   PROT 7.8 12/19/2017   ALBUMIN 4.4 12/19/2017   CALCIUM 9.8 12/19/2017   GFRAA >60 12/19/2017    Speciality Comments: No specialty comments available.  Procedures:  No procedures performed Allergies: Penicillins; Codeine; Voltaren [diclofenac]; and Erythromycin   Assessment / Plan:     Visit Diagnoses: No diagnosis found.   Orders: No orders of the defined types were placed in this encounter.  No orders of the defined types were placed in this encounter.   Face-to-face time spent with patient was *** minutes. Greater than 50% of time was spent in counseling and coordination of care.  Follow-Up Instructions: No follow-ups on file.   Earnestine Mealing,  CMA  Note - This record has been created using Editor, commissioning.  Chart creation errors have been sought, but may not always  have been located. Such creation errors do not reflect on  the standard of medical care.

## 2018-06-06 ENCOUNTER — Ambulatory Visit: Payer: Medicare Other | Admitting: Rheumatology

## 2018-06-06 ENCOUNTER — Encounter: Payer: Self-pay | Admitting: Rheumatology

## 2018-06-12 ENCOUNTER — Inpatient Hospital Stay: Payer: Medicare Other | Attending: Hematology

## 2018-06-12 DIAGNOSIS — Z87891 Personal history of nicotine dependence: Secondary | ICD-10-CM | POA: Insufficient documentation

## 2018-06-12 DIAGNOSIS — Z79899 Other long term (current) drug therapy: Secondary | ICD-10-CM | POA: Diagnosis not present

## 2018-06-12 DIAGNOSIS — I10 Essential (primary) hypertension: Secondary | ICD-10-CM | POA: Diagnosis not present

## 2018-06-12 DIAGNOSIS — D472 Monoclonal gammopathy: Secondary | ICD-10-CM | POA: Insufficient documentation

## 2018-06-12 LAB — CMP (CANCER CENTER ONLY)
ALT: 19 U/L (ref 0–44)
AST: 21 U/L (ref 15–41)
Albumin: 4.6 g/dL (ref 3.5–5.0)
Alkaline Phosphatase: 84 U/L (ref 38–126)
Anion gap: 9 (ref 5–15)
BUN: 13 mg/dL (ref 6–20)
CO2: 24 mmol/L (ref 22–32)
Calcium: 9.9 mg/dL (ref 8.9–10.3)
Chloride: 104 mmol/L (ref 98–111)
Creatinine: 0.84 mg/dL (ref 0.44–1.00)
GFR, Est AFR Am: >60 mL/min (ref >60)
GFR, Estimated: >60 mL/min (ref >60)
Glucose, Bld: 79 mg/dL (ref 70–99)
Potassium: 5.1 mmol/L (ref 3.5–5.1)
Sodium: 137 mmol/L (ref 135–145)
Total Bilirubin: 0.6 mg/dL (ref 0.3–1.2)
Total Protein: 8.5 g/dL — ABNORMAL HIGH (ref 6.5–8.1)

## 2018-06-12 LAB — CBC WITH DIFFERENTIAL/PLATELET
Abs Immature Granulocytes: 0.01 10*3/uL (ref 0.00–0.07)
Basophils Absolute: 0.1 10*3/uL (ref 0.0–0.1)
Basophils Relative: 1 %
Eosinophils Absolute: 0.1 10*3/uL (ref 0.0–0.5)
Eosinophils Relative: 1 %
HCT: 42.1 % (ref 36.0–46.0)
Hemoglobin: 14 g/dL (ref 12.0–15.0)
Immature Granulocytes: 0 %
Lymphocytes Relative: 26 %
Lymphs Abs: 1.5 10*3/uL (ref 0.7–4.0)
MCH: 32.6 pg (ref 26.0–34.0)
MCHC: 33.3 g/dL (ref 30.0–36.0)
MCV: 98.1 fL (ref 80.0–100.0)
Monocytes Absolute: 0.4 10*3/uL (ref 0.1–1.0)
Monocytes Relative: 8 %
Neutro Abs: 3.5 10*3/uL (ref 1.7–7.7)
Neutrophils Relative %: 64 %
Platelets: 303 10*3/uL (ref 150–400)
RBC: 4.29 MIL/uL (ref 3.87–5.11)
RDW: 13.1 % (ref 11.5–15.5)
WBC: 5.5 10*3/uL (ref 4.0–10.5)
nRBC: 0 % (ref 0.0–0.2)

## 2018-06-13 NOTE — Progress Notes (Signed)
Office Visit Note  Patient: Lindsey Rodriguez             Date of Birth: 11/18/1965           MRN: [id number]             PCP: Rita Ohara, MD Referring: Rita Ohara, MD Visit Date: 06/27/2018 Occupation: @GUAROCC @  Subjective:  Neck and lower back pain   History of Present Illness: Lindsey Rodriguez is a 52 y.o. female with history of chondrocalcinosis, DDD, fibromyalgia and osteoarthritis.  She continues to have chronic neck and lower back pain.  She denies any symptoms of radiculopathy at this time.  She states that she had a series of injections in her lower back in August, September, and October 2019 that provided very temporary relief.  She continues to have generalized muscle aches and muscle tenderness but overall her myalgias have improved with lifestyle changes.  She has completely changed her diet and cut out processed foods.  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  She denies any chondrocalcinosis flares recently.  She states that she has stiffness in both hands and knees at times but denies any joint swelling.  She reports that her fatigue has improved overall but is occasionally worse with stress.  She take Zanaflex 4 mg as needed for muscle spasms.  She takes naproxen very sparingly.  When she is having increased pain she takes Tylenol PRN.  She uses ice for pain relief on a regular basis. She reports she continues to have difficulty sleeping at night.  She denies any new concerns at this time.  She plans on starting to exercise more and regular basis.  She has been walking for exercise and has been doing some weightlifting and light aerobics.   Activities of Daily Living:  Patient reports morning stiffness for 1 hour.   Patient Reports nocturnal pain.  Difficulty dressing/grooming: Denies Difficulty climbing stairs: Reports Difficulty getting out of chair: Denies Difficulty using hands for taps, buttons, cutlery, and/or writing:  Denies  Review of Systems  Constitutional: Positive for fatigue.  HENT: Negative for mouth sores, trouble swallowing, trouble swallowing, mouth dryness and nose dryness.   Eyes: Negative for pain, redness, itching, visual disturbance and dryness.  Respiratory: Negative for cough, hemoptysis, shortness of breath, wheezing and difficulty breathing.   Cardiovascular: Negative for chest pain, palpitations, hypertension and swelling in legs/feet.  Gastrointestinal: Negative for abdominal pain, blood in stool, constipation, diarrhea, nausea and vomiting.  Endocrine: Negative for increased urination.  Genitourinary: Negative for painful urination, nocturia and pelvic pain.  Musculoskeletal: Positive for arthralgias, joint pain and morning stiffness. Negative for joint swelling, myalgias, muscle weakness, muscle tenderness and myalgias.  Skin: Positive for rash. Negative for color change, pallor, hair loss, nodules/bumps, skin tightness, ulcers and sensitivity to sunlight.  Allergic/Immunologic: Negative for susceptible to infections.  Neurological: Negative for light-headedness, numbness and memory loss.  Hematological: Negative for bruising/bleeding tendency and swollen glands.  Psychiatric/Behavioral: Negative for depressed mood and sleep disturbance. The patient is not nervous/anxious.     PMFS History:  Patient Active Problem List   Diagnosis Date Noted  . Lumbar scoliosis 06/15/2016  . Chondrocalcinosis 05/15/2016  . Primary osteoarthritis of both knees 05/15/2016  . Tendinopathy of right shoulder 05/15/2016  . Spondylosis of lumbar region 05/15/2016  . Plantar fasciitis, bilateral 05/15/2016  . Renal calcinosis 05/15/2016  . Malignant melanoma of right upper extremity including shoulder ([facility]) 05/15/2016  .  Nonischemic cardiomyopathy ([facility]) 04/10/2016  . Post op infection 12/18/2015  . Postoperative state 11/24/2015  . Chronic migraine without aura without status migrainosus, not  intractable 08/04/2015  . Fibromyalgia 07/26/2015  . MGUS (monoclonal gammopathy of unknown significance) 06/10/2015  . DJD (degenerative joint disease), cervical 03/02/2015  . Migraine headache 09/05/2012  . Fatigue 10/26/2011  . Pure hyperglyceridemia 04/27/2011  . Essential hypertension, benign 04/17/2011  . Vitamin D deficiency 04/17/2011  . Neuropathy 12/22/2010  . Headache 12/22/2010    Past Medical History:  Diagnosis Date  . Anemia   . Anxiety panic attacks   [name]  . Arthritis    knees  . Cardiomyopathy, nonischemic ([facility]) 04/07/2016   EF left ventricule calculated on Lexiscan stress test at 33% with global hypokinesis.   . Chronic back pain   . Chronic neck pain   . Depression   . Dysrhythmia    pvc's with cardiomyopathy  . Family history of adverse reaction to anesthesia    mother has problems waking up  . Fibromyalgia    and neuropathy of feet  . History of kidney stones    lithotripsy  03/2016  . Hyperlipidemia   . Hypertension   . Kidney stone    History  . Kidney stones h/o  . Melanoma ([facility]) RUE   [name]  . MGUS (monoclonal gammopathy of unknown significance) 06/2015   no treatment just watching recheck blood work 12/2015  . Migraine    OTC med prn - stress related  . Plantar fasciitis right  . Renal stones 2008/2011/2017  . Seasonal allergies   . SVD (spontaneous vaginal delivery)    x 3  . Tendonitis of foot right    Family History  Problem Relation Age of Onset  . Hypertension Mother   . Depression Mother   . Migraines Mother   . Hypothyroidism Mother   . COPD Father   . Heart failure Father 48  . Healthy Brother   . Healthy Daughter   . Healthy Son   . Healthy Daughter   . Diabetes Neg Hx    Past Surgical History:  Procedure Laterality Date  . ABDOMINAL HYSTERECTOMY    . ABLATION ON ENDOMETRIOSIS N/A 11/24/2015   Procedure: ABLATION ON ENDOMETRIOSIS;  Surgeon: [name], MD;  Location: [facility];  Service: Gynecology;   Laterality: N/A;  . ANTERIOR LAT LUMBAR FUSION Right 06/15/2016   Procedure: RIGHT LUMBAR THREE-FOUR ANTEROLATERAL LUMBAR INTERBODY FUSION WITH LATERAL PLATE;  Surgeon: Erline Levine, MD;  Location: [facility];  Service: Neurosurgery;  Laterality: Right;  . BACK SURGERY  Dr.Stern, last sx Summer 2011   X 4  (L3-S1)  . BACK SURGERY  06/2016  . BLADDER SUSPENSION N/A 11/24/2015   Procedure: TRANSVAGINAL TAPE (TVT) PROCEDURE;  Surgeon: [name], MD;  Location: [facility];  Service: Gynecology;  Laterality: N/A;  . COLONOSCOPY    . CYSTOCELE REPAIR  11/24/2015   Procedure: ANTERIOR REPAIR (CYSTOCELE);  Surgeon: [name], MD;  Location: [facility];  Service: Gynecology;;  . Consuela Mimes N/A 11/24/2015   Procedure: CYSTOSCOPY;  Surgeon: [name], MD;  Location: [facility];  Service: Gynecology;  Laterality: N/A;  . DILATION AND CURETTAGE OF UTERUS    . ENDOMETRIAL ABLATION  08/2000  . EYE SURGERY Bilateral    Lasik   . KIDNEY STONE SURGERY  2011  . LITHOTRIPSY  08/2017  . MELANOMA EXCISION  05/2015   right forearm; [name]  . NECK SURGERY  [name]  Summer 2011  X 4  (C4-T1 Level)  . RHINOPLASTY    . ROBOTIC ASSISTED BILATERAL SALPINGO OOPHERECTOMY Bilateral 11/24/2015   Procedure: ROBOTIC ASSISTED BILATERAL SALPINGO OOPHORECTOMY;  Surgeon: [name], MD;  Location: [facility];  Service: Gynecology;  Laterality: Bilateral;--OVARIES NOT REMOVED PER PATH REPORT  . ROBOTIC ASSISTED TOTAL HYSTERECTOMY WITH SALPINGECTOMY Bilateral 11/24/2015   Procedure: ROBOTIC ASSISTED TOTAL HYSTERECTOMY WITH SALPINGECTOMY;  Surgeon: [name], MD;  Location: [facility];  Service: Gynecology;  Laterality: Bilateral;  . TONSILLECTOMY    . WISDOM TOOTH EXTRACTION     Social History   Social History Narrative   Married.  Lives with husband, 1 daughter (in college at [employer]), son; brother-in-law (with Down's syndrome) , had a stroke, in SNF   1 dog.   1 daughter in [place], [place].   Granddaughter in  [place], [place] (with her mother, who re-married)   Disabled due to anxiety.  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  Constitutional:      Appearance: She is well-developed.  HENT:     Head: Normocephalic and atraumatic.  Eyes:     Conjunctiva/sclera: Conjunctivae normal.  Neck:     Musculoskeletal: Normal range of motion.  Cardiovascular:     Rate and Rhythm: Normal rate and regular rhythm.     Heart sounds: Normal heart sounds.  Pulmonary:     Effort: Pulmonary effort is normal.     Breath sounds: Normal breath sounds.  Abdominal:     General: Bowel sounds are normal.     Palpations: Abdomen is soft.  Lymphadenopathy:     Cervical: No cervical adenopathy.  Skin:    General: Skin is warm and dry.     Capillary Refill: Capillary refill takes less than 2 seconds.  Neurological:     Mental Status: She is alert and oriented to person, place, and time.  Psychiatric:        Behavior: Behavior normal.      Musculoskeletal Exam: C-spine limited range of motion.  Thoracic and lumbar spine good range of motion.  No midline spinal tenderness.  No SI joint tenderness.  Shoulder joints, elbow joints, wrist joints, MCPs, PIPs, DIPs good range of motion with no synovitis.  She has PIP and DIP synovial thickening consistent with osteoarthritis of bilateral hands.  She has complete fist formation bilaterally.  Hip joints, knee joints, ankle joints, MTPs, PIPs and DIPs good range of motion no synovitis.  No warmth or effusion bilateral knee joints.  She has bilateral knee crepitus.  No tenderness or swelling of ankle joints.  Mild tenderness of bilateral trochanteric bursa worse on the right.  CDAI  Exam: CDAI Score: Not documented Patient Global Assessment: Not documented; Provider Global Assessment: Not documented Swollen: Not documented; Tender: Not documented Joint Exam   Not documented   There is currently no information documented on the homunculus. Go to the Rheumatology activity and complete the homunculus joint exam.  Investigation: No additional findings.  Imaging: No results found.  Recent Labs: Lab Results  Component Value Date   WBC 5.5 06/12/2018   HGB 14.0 06/12/2018   PLT  303 06/12/2018   NA 137 06/12/2018   K 5.1 06/12/2018   CL 104 06/12/2018   CO2 24 06/12/2018   GLUCOSE 79 06/12/2018   BUN 13 06/12/2018   CREATININE 0.84 06/12/2018   BILITOT 0.6 06/12/2018   ALKPHOS 84 06/12/2018   AST 21 06/12/2018   ALT 19 06/12/2018   PROT 8.5 (H) 06/12/2018   ALBUMIN 4.6 06/12/2018   CALCIUM 9.9 06/12/2018   GFRAA >60 06/12/2018    Speciality Comments: No specialty comments available.  Procedures:  No procedures performed Allergies: Penicillins; Codeine; Voltaren [diclofenac]; and Erythromycin   Assessment / Plan:     Visit Diagnoses: Chondrocalcinosis: She has not had any recent flares.  No warmth or effusion noted.  She has bilateral knee crepitus but good range of motion on exam.  Primary osteoarthritis of both knees: No warmth or effusion.  Good range of motion.  She has bilateral knee crepitus.  She has occasional stiffness in both knees but now joint pain.  She uses ice and elevates her knees when they are comfortable.  She also takes Tylenol very sparingly for pain relief.  DDD (degenerative disc disease), cervical - s/p C-spine fusion and discectomy by Dr. Vertell Limber: She has limited range of motion with discomfort.  She has no symptoms of radiculopathy at this time.  Spondylosis of lumbar region: She has chronic lower back pain.  She has had a series of epidural injections in her lower back in August September and October 2019 which provided very  temporary relief.  Plantar fasciitis, bilateral: Resolved. She wears proper fitting shoes.  Fibromyalgia: Her fibromyalgia has been well controlled.  She has occasional muscle aches and muscle tenderness but has been exercising on a regular basis.  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  She has cut out all processed foods and made dietary modifications which she feels is been improving her overall level of pain.  She takes Zanaflex 4 mg PRN for muscle spasms very sparingly as well as Tylenol for pain relief very sparingly.  She continues to have chronic insomnia but her fatigue has improved.  She experiences increased fatigue with stress.  She was encouraged to stay active and exercise on a regular basis.  Other medical conditions are listed as follows:  Nonischemic cardiomyopathy ([facility])  Neuropathy - small fiber neuropathy- biopsy proven.  Patient discontinued Lyrica due to side effects on her memory  Renal calcinosis  History of hypertension   Orders: No orders of the defined types were placed in this encounter.  No orders of the defined types were placed in this encounter.    Follow-Up Instructions: Return in about 6 months (around 12/27/2018) for Fibromyalgia, Osteoarthritis, DDD.   Ofilia Neas, PA-C   I examined and evaluated the patient with Hazel Sams PA.  Patient continues to have some lower back pain.  She also have some generalized arthralgias and myalgias.  We had detailed discussion regarding back muscle strengthening exercises.  Handout on back exercises was given.  Exercises were also demonstrated in the office today.  The plan of care was discussed as noted above.  Bo Merino, MD  Note - This record has been created using Editor, commissioning.  Chart creation errors have been sought, but may not always  have been located. Such creation errors do not reflect on  the standard of medical care.

## 2018-06-14 NOTE — Progress Notes (Addendum)
Received notice via [email or web address] that pt's  AIMOVIG 70mg /mL auto-injectors have been  Approved thru 07/03/2019 [place] Reference # [id number]

## 2018-06-14 NOTE — Progress Notes (Signed)
Prior Authorization initiated via [email or web address] for pt's   Aimovig 70mg /mL auto-injector

## 2018-06-18 LAB — MULTIPLE MYELOMA PANEL, SERUM
Albumin SerPl Elph-Mcnc: 4.2 g/dL (ref 2.9–4.4)
Albumin/Glob SerPl: 1.3 (ref 0.7–1.7)
Alpha 1: 0.2 g/dL (ref 0.0–0.4)
Alpha2 Glob SerPl Elph-Mcnc: 0.7 g/dL (ref 0.4–1.0)
B-Globulin SerPl Elph-Mcnc: 1.1 g/dL (ref 0.7–1.3)
Gamma Glob SerPl Elph-Mcnc: 1.4 g/dL (ref 0.4–1.8)
Globulin, Total: 3.4 g/dL (ref 2.2–3.9)
IgA: 85 mg/dL — ABNORMAL LOW (ref 87–352)
IgG (Immunoglobin G), Serum: 1457 mg/dL (ref 700–1600)
IgM (Immunoglobulin M), Srm: 67 mg/dL (ref 26–217)
M Protein SerPl Elph-Mcnc: 1 g/dL — ABNORMAL HIGH
Total Protein ELP: 7.6 g/dL (ref 6.0–8.5)

## 2018-06-18 NOTE — Progress Notes (Signed)
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 Skeletal survey negative. Bone marrow deferred as per patient choice.  Current Treatment: observation   INTERVAL HISTORY:  [name] is here for her scheduled 6 month follow-up for IgG lambda MGUS. The patient's last visit with [place] was on 12/19/17. The pt reports that she is doing well overall.   The pt reports that she began having some rib pain towards the left side of her back and denies pushing, pulling, or lifting anything heavy. She describes this pain as intermittent and denies it worsening.   The pt notes that her dermatologist diagnosed her with a fungal infection that has since been treated and resolved.   The pt received a injection at L3 in the interim, which has successfully treated her lower back pain. She also notes however, that she is having worsening neuropathy/radiculopathy with more frequent feelings of tingling and numbness.   The pt also notes that she is "getting over bronchitis and allergy issues."   The pt notes that her "heart is okay," and has continued following up with Dr. Einar Gip, and notes that her last LV EF had normalized to 55%.   Lab results (06/12/18) of CBC w/diff and CMP is as follows: all values are WNL except for Total Protein at 8.5. 06/12/18 MMP revealed all values WNL except for IgA at 55, M Protein at 1.0  On review of systems, pt reports left sided rib pain, recent bronchitis, improved lower back pain, worsened neuropathy/radiculopathy, and denies unresolved skin rashes, abdominal pains, leg swelling, and any other symptoms.    REVIEW OF SYSTEMS:    A 10+ POINT REVIEW OF SYSTEMS WAS OBTAINED including  neurology, dermatology, psychiatry, cardiac, respiratory, lymph, extremities, GI, GU, Musculoskeletal, constitutional, breasts, reproductive, HEENT.  All pertinent positives are noted in the HPI.  All others are negative.  . Past Medical History:  Diagnosis Date  . Anemia   . Anxiety panic attacks   [name]  . Arthritis    knees  . Cardiomyopathy, nonischemic ([place]) 04/07/2016   EF left ventricule calculated on Lexiscan stress test at 33% with global hypokinesis.   . Chronic back pain   . Chronic neck pain   . Depression   . Dysrhythmia    pvc's with cardiomyopathy  . Family history of adverse reaction to anesthesia    mother has problems waking up  . Fibromyalgia    and neuropathy of feet  . History of kidney stones    lithotripsy  03/2016  . Hyperlipidemia   . Hypertension   . Kidney stone    History  . Kidney stones h/o  . Melanoma ([place]) RUE   [name]  . MGUS (monoclonal gammopathy of unknown significance) 06/2015   no treatment just watching recheck blood work 12/2015  . Migraine    OTC med prn - stress related  . Plantar fasciitis right  . Renal stones 2008/2011/2017  . Seasonal allergies   . SVD (spontaneous vaginal delivery)    x 3  . Tendonitis of foot right    Past Surgical History:  Procedure Laterality Date  . ABDOMINAL HYSTERECTOMY    . ABLATION ON ENDOMETRIOSIS N/A 11/24/2015   Procedure: ABLATION ON ENDOMETRIOSIS;  Surgeon: Sharyn Lull  Philis Pique, MD;  Location: [facility];  Service: Gynecology;  Laterality: N/A;  . ANTERIOR LAT LUMBAR FUSION Right 06/15/2016   Procedure: RIGHT LUMBAR THREE-FOUR ANTEROLATERAL LUMBAR INTERBODY FUSION WITH LATERAL PLATE;  Surgeon: Erline Levine, MD;  Location: [facility];  Service: Neurosurgery;  Laterality: Right;  . BACK SURGERY  Dr.Stern, last sx Summer 2011   X 4  (L3-S1)  . BACK SURGERY  06/2016  . BLADDER SUSPENSION N/A 11/24/2015   Procedure: TRANSVAGINAL TAPE (TVT) PROCEDURE;  Surgeon: [name], MD;  Location: [facility];   Service: Gynecology;  Laterality: N/A;  . COLONOSCOPY    . CYSTOCELE REPAIR  11/24/2015   Procedure: ANTERIOR REPAIR (CYSTOCELE);  Surgeon: [name], MD;  Location: [facility];  Service: Gynecology;;  . Consuela Mimes N/A 11/24/2015   Procedure: CYSTOSCOPY;  Surgeon: [name], MD;  Location: [facility];  Service: Gynecology;  Laterality: N/A;  . DILATION AND CURETTAGE OF UTERUS    . ENDOMETRIAL ABLATION  08/2000  . EYE SURGERY Bilateral    Lasik   . KIDNEY STONE SURGERY  2011  . LITHOTRIPSY  08/2017  . MELANOMA EXCISION  05/2015   right forearm; [name]  . NECK SURGERY  [name]  Summer 2011   X 4  (C4-T1 Level)  . RHINOPLASTY    . ROBOTIC ASSISTED BILATERAL SALPINGO OOPHERECTOMY Bilateral 11/24/2015   Procedure: ROBOTIC ASSISTED BILATERAL SALPINGO OOPHORECTOMY;  Surgeon: [name], MD;  Location: [facility];  Service: Gynecology;  Laterality: Bilateral;--OVARIES NOT REMOVED PER PATH REPORT  . ROBOTIC ASSISTED TOTAL HYSTERECTOMY WITH SALPINGECTOMY Bilateral 11/24/2015   Procedure: ROBOTIC ASSISTED TOTAL HYSTERECTOMY WITH SALPINGECTOMY;  Surgeon: [name], MD;  Location: [facility];  Service: Gynecology;  Laterality: Bilateral;  . TONSILLECTOMY    . WISDOM TOOTH EXTRACTION      Social History   Tobacco Use  . Smoking status: Former Smoker    Packs/day: 0.10    Years: 3.00    Pack years: 0.30    Types: Cigarettes    Last attempt to quit: 07/04/1987    Years since quitting: 30.9  . Smokeless tobacco: Never Used  Substance Use Topics  . Alcohol use: Yes    Alcohol/week: 0.0 standard drinks    Comment: rarely   . Drug use: No   ALLERGIES:  is allergic to penicillins; codeine; voltaren [diclofenac]; and erythromycin.  MEDICATIONS:  Current Outpatient Medications  Medication Sig Dispense Refill  . carvedilol (COREG) 6.25 MG tablet TAKE 1 (ONE) TABLET BY MOUTH TWO TIMES DAILY AS DIRECTEED  1  . Erenumab-aooe (AIMOVIG) 70 MG/ML SOAJ Inject 70 mg into the skin every 30  (thirty) days. 1 pen 11  . FLUoxetine (PROZAC) 20 MG capsule Take 60 mg by mouth daily.    Marland Kitchen lisinopril (PRINIVIL,ZESTRIL) 20 MG tablet Take 10 mg by mouth every evening.     Marland Kitchen LORazepam (ATIVAN) 0.5 MG tablet Take 1-2 tablets (0.5-1 mg total) by mouth 3 (three) times daily as needed for anxiety. Prn anxiety 30 tablet 0  . mirabegron ER (MYRBETRIQ) 50 MG TB24 tablet Take 50 mg by mouth daily.    Marland Kitchen MISC NATURAL PRODUCT OP Take 2 capsules by mouth daily. Omega-Q    . MISC NATURAL PRODUCTS PO Take 2 capsules by mouth 2 (two) times daily. Rejuvenixx    . MISC NATURAL PRODUCTS PO Take 1 capsule by mouth 2 (two) times daily. Optimal- V    . MISC NATURAL PRODUCTS PO Take 1 capsule by mouth 2 (two) times daily. Optimal-M    .  MISC NATURAL PRODUCTS PO Take 1 capsule by mouth 2 (two) times daily. MagnaCal-D    . tiZANidine (ZANAFLEX) 4 MG tablet Take 1 tablet (4 mg total) by mouth every 6 (six) hours as needed for muscle spasms. 30 tablet 5   No current facility-administered medications for this visit.     PHYSICAL EXAMINATION: ECOG PERFORMANCE STATUS: 2  . Vitals:   06/19/18 0929  BP: 124/75  Pulse: 67  Resp: 18  Temp: 98.3 F (36.8 C)  SpO2: 100%    Filed Weights   06/19/18 0929  Weight: 155 lb 14.4 oz (70.7 kg)   .Body mass index is 25.16 kg/m.  GENERAL:alert, in no acute distress and comfortable SKIN: no acute rashes, no significant lesions EYES: conjunctiva are pink and non-injected, sclera anicteric OROPHARYNX: MMM, no exudates, no oropharyngeal erythema or ulceration NECK: supple, no JVD LYMPH:  no palpable lymphadenopathy in the cervical, axillary or inguinal regions LUNGS: clear to auscultation b/l with normal respiratory effort HEART: regular rate & rhythm ABDOMEN:  normoactive bowel sounds , non tender, not distended. No palpable hepatosplenomegaly.  Extremity: no pedal edema PSYCH: alert & oriented x 3 with fluent speech NEURO: no focal motor/sensory deficits    LABORATORY DATA:   I have reviewed the data as listed  . CBC Latest Ref Rng & Units 06/12/2018 12/19/2017 06/20/2017  WBC 4.0 - 10.5 K/uL 5.5 5.7 5.3  Hemoglobin 12.0 - 15.0 g/dL 14.0 13.3 13.7  Hematocrit 36.0 - 46.0 % 42.1 38.7 40.9  Platelets 150 - 400 K/uL 303 280 301    . CMP Latest Ref Rng & Units 06/12/2018 03/13/2018 12/19/2017  Glucose 70 - 99 mg/dL 79 80 84  BUN 6 - 20 mg/dL 13 - 19  Creatinine 0.44 - 1.00 mg/dL 0.84 - 0.81  Sodium 135 - 145 mmol/L 137 - 135(L)  Potassium 3.5 - 5.1 mmol/L 5.1 - 4.3  Chloride 98 - 111 mmol/L 104 - 103  CO2 22 - 32 mmol/L 24 - 26  Calcium 8.9 - 10.3 mg/dL 9.9 - 9.8  Total Protein 6.5 - 8.1 g/dL 8.5(H) - 7.8  Total Bilirubin 0.3 - 1.2 mg/dL 0.6 - 0.4  Alkaline Phos 38 - 126 U/L 84 - 71  AST 15 - 41 U/L 21 - 18  ALT 0 - 44 U/L 19 - 17        RADIOGRAPHIC STUDIES: I have personally reviewed the radiological images as listed and agreed with the findings in the report. Mm 3d Screen Breast Bilateral  Result Date: 05/24/2018 CLINICAL DATA:  Screening. EXAM: DIGITAL SCREENING BILATERAL MAMMOGRAM WITH TOMO AND CAD COMPARISON:  Previous exam(s). ACR Breast Density Category c: The breast tissue is heterogeneously dense, which may obscure small masses. FINDINGS: There are no findings suspicious for malignancy. Images were processed with CAD. IMPRESSION: No mammographic evidence of malignancy. A result letter of this screening mammogram will be mailed directly to the patient. RECOMMENDATION: Screening mammogram in one year. (Code:SM-B-01Y) BI-RADS CATEGORY  1: Negative. Electronically Signed   By: Everlean Alstrom M.D.   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  Previous UPEP and skeletal survey negative. Patient has no evidence of  anemia, renal failure on labs. No hypercalcemia. No new bone pain. SPEP shows stable M-spike at 1g/dl with no significant increase over the last  couple of follow-ups. SFLC ratio within normal limits  PLAN: -Discussed pt labwork from 06/12/18; all blood counts are normal, M Protein stable at 1.0, same as one year ago. Chemistries remain stable as well, with normal Creatinine and calcium levels.  -Per pt, most recent ECHO showed the LV EF normalized to 55%, continuing to reassure against amyloidosis -Radiculopathy worsened, do not feel that this is neuropathy related to her MGUS as her M protein is IgG lambda, not IgM which is more commonly associated with neuropathies  -No CRAB criteria at this time -Pt does endorse intermittent, non progressive left sided rib pain, and will continue to monitor this. No overt suspicion for bone tumors at this time, pt will let me know if this worsens or progresses.  -The pt shows no lab progression of her MGUS at this time.  -No indication to initiate active treatment at this time.   -Discussed that the patient's monoclonal protein levels are in the intermediate risk with a level of 1.0g -Discussed that it is reasonable to continue watching the patient clinically and with labs every 6 months at this time, and no overt indication to pursue a BM Bx -Will see the pt back in 6 months, sooner if any new concerns   2) Systolic nonischemic CHF patient previously noted improvement in her EF from 33% to 45%. She subsequently noted her EF normalized to 55% as of 06/19/18 clinic visit.  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 Plan -Continue management per dermatology and ongoing skin surveillance. -continue sun protection.  4) Peripheral radiculopathy /neuropathy -Is currently  followed by Vertell Limber of Neurosurgery as well as Dr Tomi Likens of Neurology who have begun to work this up further. -Most recent electromyography showed evidence of radiculopathy, mild in degree, and without evidence of sensorimotor polyneuropathy.   Cannot r/o small fiber neuropathy -  Evidence of this on epidermal nerve fiber density analysis. Plan -Mx per neurology  Continue followup with primary care physician, rheumatology, cardiology, and neurology    RTC with Dr Irene Limbo in 68month with labs . Plz schedule labs about 7-10 days prior to clinic visit   The total time spent in the appt was 20 minutes and more than 50% was on counseling and direct patient cares.    GSullivan LoneMD MPalo AltoAAHIVMS SCommunity [facility] Center, IncCPediatric Surgery Center Od[facility] LLCHematology/Oncology Physician CCent[facility] (Office):       [phone](Work cell):  [phone](Fax):           [phone] I, SBaldwin Jamaica am acting as a scribe for Dr. GSullivan Lone   .I have reviewed the above documentation for accuracy and completeness, and I agree with the above. .Brunetta GeneraMD

## 2018-06-19 ENCOUNTER — Encounter: Payer: Self-pay | Admitting: Hematology

## 2018-06-19 ENCOUNTER — Inpatient Hospital Stay (HOSPITAL_BASED_OUTPATIENT_CLINIC_OR_DEPARTMENT_OTHER): Payer: Medicare Other | Admitting: Hematology

## 2018-06-19 ENCOUNTER — Telehealth: Payer: Self-pay

## 2018-06-19 VITALS — BP 124/75 | HR 67 | Temp 98.3°F | Resp 18 | Ht 66.0 in | Wt 155.9 lb

## 2018-06-19 DIAGNOSIS — Z79899 Other long term (current) drug therapy: Secondary | ICD-10-CM | POA: Diagnosis not present

## 2018-06-19 DIAGNOSIS — Z87891 Personal history of nicotine dependence: Secondary | ICD-10-CM

## 2018-06-19 DIAGNOSIS — D472 Monoclonal gammopathy: Secondary | ICD-10-CM

## 2018-06-19 DIAGNOSIS — I10 Essential (primary) hypertension: Secondary | ICD-10-CM

## 2018-06-19 NOTE — Telephone Encounter (Signed)
Printed avs and calender of upcoming appointment. Per 12/18 los 

## 2018-06-27 ENCOUNTER — Ambulatory Visit: Payer: Medicare Other | Admitting: Rheumatology

## 2018-06-27 ENCOUNTER — Encounter: Payer: Self-pay | Admitting: Rheumatology

## 2018-06-27 VITALS — BP 120/75 | HR 58 | Resp 13 | Ht 66.0 in | Wt 158.0 lb

## 2018-06-27 DIAGNOSIS — M47816 Spondylosis without myelopathy or radiculopathy, lumbar region: Secondary | ICD-10-CM

## 2018-06-27 DIAGNOSIS — M1120 Other chondrocalcinosis, unspecified site: Secondary | ICD-10-CM

## 2018-06-27 DIAGNOSIS — M797 Fibromyalgia: Secondary | ICD-10-CM

## 2018-06-27 DIAGNOSIS — I428 Other cardiomyopathies: Secondary | ICD-10-CM

## 2018-06-27 DIAGNOSIS — Z8679 Personal history of other diseases of the circulatory system: Secondary | ICD-10-CM

## 2018-06-27 DIAGNOSIS — M722 Plantar fascial fibromatosis: Secondary | ICD-10-CM

## 2018-06-27 DIAGNOSIS — N29 Other disorders of kidney and ureter in diseases classified elsewhere: Secondary | ICD-10-CM

## 2018-06-27 DIAGNOSIS — M170 Bilateral primary osteoarthritis of knee: Secondary | ICD-10-CM

## 2018-06-27 DIAGNOSIS — G629 Polyneuropathy, unspecified: Secondary | ICD-10-CM

## 2018-06-27 DIAGNOSIS — M5030 Other cervical disc degeneration, unspecified cervical region: Secondary | ICD-10-CM

## 2018-06-27 NOTE — Patient Instructions (Signed)

## 2018-08-21 ENCOUNTER — Encounter: Payer: Self-pay | Admitting: Family Medicine

## 2018-08-21 ENCOUNTER — Ambulatory Visit: Payer: Medicare Other | Admitting: Family Medicine

## 2018-08-21 VITALS — BP 132/82 | HR 84 | Ht 66.0 in | Wt 159.6 lb

## 2018-08-21 DIAGNOSIS — J019 Acute sinusitis, unspecified: Secondary | ICD-10-CM

## 2018-08-21 DIAGNOSIS — G43019 Migraine without aura, intractable, without status migrainosus: Secondary | ICD-10-CM | POA: Diagnosis not present

## 2018-08-21 DIAGNOSIS — R070 Pain in throat: Secondary | ICD-10-CM

## 2018-08-21 MED ORDER — AZITHROMYCIN 250 MG PO TABS: ORAL_TABLET | ORAL | 0 refills | Status: DC

## 2018-08-21 MED ORDER — PROMETHAZINE HCL 25 MG/ML IJ SOLN
25.0000 mg | Freq: Once | INTRAMUSCULAR | Status: AC
  Administered 2018-08-21: 25 mg via INTRAMUSCULAR

## 2018-08-21 MED ORDER — PROMETHAZINE HCL 25 MG PO TABS: 25.0000 mg | ORAL_TABLET | Freq: Three times a day (TID) | ORAL | 0 refills | Status: DC | PRN

## 2018-08-21 MED ORDER — KETOROLAC TROMETHAMINE 60 MG/2ML IM SOLN
60.0000 mg | Freq: Once | INTRAMUSCULAR | Status: AC
  Administered 2018-08-21: 60 mg via INTRAMUSCULAR

## 2018-08-21 NOTE — Progress Notes (Signed)
Chief Complaint  Patient presents with  . Migraine    feels like she is "choking", throat feels like it is "squeezing.' Hard to swallow water but not sore-started Thursday. Migraine primarily on right side behind right eye and right ear is throbbing. Migraine started Thursday as well.    6 days ago she started with a typical migraine. Usually tizanidine and ice pack, along with laying down resolves the pain, but it hasn't this time.  She has also taken tylenol, lorazepam to try and help with her headache. She got some temporary benefit, but hasn't resolved. Headache got worse last night--stronger.  +nausea, no vomiting.  She has some ringing in her ears. Back of both legs felt numb last night (resolved). No other neuro symptoms. Pain is above her right eye, wraps around to the top and back of her head. She has pain in and behind her right ear.'  She has had some head congestion, PND, no cough. +sinus pressure in her cheeks, L>R.  Denies any discolored mucus or phlegm.  mucinex made her head feel foggy, so didn't continue.  Not getting drainage from sinus rinses.  She feels like her throat is intermitently closing up, feels tight. Feels like the liquid gets hung up in her lower throat/upper chest. Notices it with medicine, water and food.  +heartburn, squeezing pressure in her chest at times.  Not currently symptomatic.  She is under the care of Dr. Tomi Likens for migraine treatment.  She has been on Aimovig, was due for her dose yesterday, didn't take since it wasn't helping. Hasn't scheduled f/u with neuro.  Didn't take meds last night or this morning (BP).  PMH, PSH, SH reviewed  Outpatient Encounter Medications as of 08/21/2018  Medication Sig Note  . carvedilol (COREG) 6.25 MG tablet TAKE 1 (ONE) TABLET BY MOUTH TWO TIMES DAILY AS DIRECTEED   . FLUoxetine (PROZAC) 40 MG capsule Take 80 mg by mouth daily.   Marland Kitchen lisinopril (PRINIVIL,ZESTRIL) 20 MG tablet Take 10 mg by mouth every evening.     Marland Kitchen MISC NATURAL PRODUCT OP Take 2 capsules by mouth daily. Omega-Q   . MISC NATURAL PRODUCTS PO Take 2 capsules by mouth 2 (two) times daily. Rejuvenixx   . MISC NATURAL PRODUCTS PO Take 1 capsule by mouth 2 (two) times daily. Optimal- V   . MISC NATURAL PRODUCTS PO Take 1 capsule by mouth 2 (two) times daily. Optimal-M   . MISC NATURAL PRODUCTS PO Take 1 capsule by mouth 2 (two) times daily. MagnaCal-D 06/20/2017: 2 capsules twice daily  . Erenumab-aooe (AIMOVIG) 70 MG/ML SOAJ Inject 70 mg into the skin every 30 (thirty) days. (Patient not taking: Reported on 08/21/2018) 08/21/2018: Did not take dose that was due yesterday  . LORazepam (ATIVAN) 0.5 MG tablet Take 1-2 tablets (0.5-1 mg total) by mouth 3 (three) times daily as needed for anxiety. Prn anxiety (Patient not taking: Reported on 08/21/2018) 03/13/2018: Uses for headaches and anxiety, prn, about 2x/week  . tiZANidine (ZANAFLEX) 4 MG tablet Take 1 tablet (4 mg total) by mouth every 6 (six) hours as needed for muscle spasms. (Patient not taking: Reported on 08/21/2018)   . [DISCONTINUED] fluconazole (DIFLUCAN) 200 MG tablet 1 TABLET WITH ORANGE JUICE ONCE A DAY SWEAT 1 HOUR AFTER ORALLY 7 DAYS   . [DISCONTINUED] FLUoxetine (PROZAC) 20 MG capsule Take 60 mg by mouth daily.   . [DISCONTINUED] mirabegron ER (MYRBETRIQ) 50 MG TB24 tablet Take 50 mg by mouth daily.    No facility-administered  encounter medications on file as of 08/21/2018.    Allergies  Allergen Reactions  . Penicillins Shortness Of Breath and Rash    Has patient had a PCN reaction causing immediate rash, facial/tongue/throat swelling, SOB or lightheadedness with hypotension: no Has patient had a PCN reaction causing severe rash involving mucus membranes or skin necrosis: no Has patient had a PCN reaction that required hospitalization no Has patient had a PCN reaction occurring within the last 10 years: no If all of the above answers are "NO", then may proceed with Cephalosporin  use.   . Codeine Other (See Comments)    Manic depressive  . Voltaren [Diclofenac] Nausea And Vomiting and Other (See Comments)    Pt stated "tore stomach up"  . Erythromycin Rash   Pt states she has tolerated z-pak in the past without rash or reactions  ROS:  She has felt chilled, unsure about fever.  URI symptoms (R ear pain, sinus pressure, throat drainage) per HPI.  +nausea. No vomiting or diarrhea.  +headache, photophobia.  Currently without numbness, tingling, or other neuro complaints other than headache.  PHYSICAL EXAM:  BP 132/82 (BP Location: Left Arm, Cuff Size: Normal)   Pulse 84   Ht 5\' 6"  (1.676 m)   Wt 159 lb 9.6 oz (72.4 kg)   LMP 12/16/2012   BMI 25.76 kg/m   Ill-appearing female, in dark room, moving/talking very quietly and slowly. Accompanied by her husband HEENT: conjunctiva and sclera are clear, EOMI, fundi benign. TM's and EACs normal bilaterally. Tender at R mastoid, trapezius, diffusely at right neck. No lymphadenopathy noted, no muscle spasm. OP very red posteriorly, moist mucus membranes. Nasal mucosa mod edematous R>L Tender R frontal and R maxillary sinus, minimal at L cheek Heart: regular rate and rhythm Lungs: clear bilaterally Back: no spinal or CVA tenderness Abdomen: +mild epigastric tenderness. No rebound, guarding, masses Extremities: no edema Neuro: alert, cranial nerves intact. Strength grossly normal.  ASSESSMENT/PLAN:  Intractable migraine without aura and without status migrainosus - given phenergan and toradol in office; rx for phenergan tabs. f/u with Dr. Tomi Likens re: migraine tx. Sinusitis may contribute to HA - Plan: promethazine (PHENERGAN) 25 MG tablet, ketorolac (TORADOL) injection 60 mg, promethazine (PHENERGAN) injection 25 mg  Acute non-recurrent sinusitis, unspecified location - R max (+/- frontal); tx with zpak, siinus rinses. likely exacerbating HA's, and throat sx - Plan: azithromycin (ZITHROMAX) 250 MG tablet  Throat pain -  swelling and choking sensation--suspect due to PND from sinusitis +/- reflux. Tx for both     I recommend taking prilosec OTC or Nexium OTC once daily, or Pepcid (twice daily) to see if this helps with the throat and chest symptoms. Follow up with Dr. Tomi Likens on your migraines. You were given toradol and phenergan shots today.  Use the phenergan tablets as needed for nausea. These may make you sleepy. Other options for the future include zofran (pills that can melt in your mouth and not need water, or phenergan suppositories).

## 2018-08-21 NOTE — Patient Instructions (Addendum)
  I recommend taking prilosec OTC or Nexium OTC once daily, or Pepcid (twice daily) to see if this helps with the throat and chest symptoms. Follow up with Dr. Tomi Likens on your migraines. You were given toradol and phenergan shots today.  Use the phenergan tablets as needed for nausea. These may make you sleepy. Other options for the future include zofran (pills that can melt in your mouth and not need water, or phenergan suppositories).  I suspect there is an underlying sinus infection that is causing your sinus pain, and contributing to your migraine.  Your throat is very red and right nasal mucosa is very swollen. The drainage down the throat can also contribute to your choking symptoms.

## 2018-09-02 NOTE — Progress Notes (Deleted)
NEUROLOGY FOLLOW UP OFFICE NOTE  Lindsey Rodriguez [id number]  HISTORY OF PRESENT ILLNESS: Lindsey Rodriguez is a 53 year old right-handed woman with chronic neck and back pain, MGUS, fibromyalgia, cardiomyopathy, hypertension, depression, and history of kidney stones who follows up for migraines and small fiber neuropathy.  She is accompanied by her husband who supplements history.  UPDATE: She was started on Aimovig.  However, she self discontinued because it was not helping her. Intensity:  Mild to severe Duration:  1 hour to 6 hours Frequency:  Daily to every other day Frequency of abortive medication: 3 days a week Current NSAIDS:  no Current analgesics:  Tylenol  Current triptans:  no Current ergotamine:  no Current anti-emetic:  no Current muscle relaxants:  Tizanidine (also for neck stiffness) Current anti-anxiolytic:  lorazepam Current sleep aide:  no Current Antihypertensive medications:  Lisinopril, Coreg Current Antidepressant medications:  fluoxetine 60mg  Current Anticonvulsant medications:  Depakote ER 500mg  Lyrica 75mg  twice daily Current anti-CGRP:   Aimovig 70 mg Current Vitamins/Herbal/Supplements:  CoQ10 30mg  Current Antihistamines/Decongestants:  no Other therapy:  no  Depression:  yes; Anxiety:  yes  HISTORY:  Onset: Childhood Location:Varies (back of head, crown, bi-temporal, retro-orbital, band-like) Quality:Retro-orbital throbbing, band-like vice, stabbing at back of head Initial Intensity:4/10 constant, 8/10 when severe; August: Tension 6-9/10; Migraine 9/10 Aura:no Prodrome:no Associated symptoms: Nausea, photophobia, phonophobia, tunnel vision.  No associated vomiting or unilateral numbness or weakness. Initial Duration:Constant but severe episodes 8 hours (with sumatriptan and naproxen); August: Tension 59min to 3 hours; Migraine until goes to sleep Initial Frequency:Daily but severe episodes occur once a week; August: Tension 3  days per week; Migraine once every 2 weeks) Triggers: Emotional stress Relieving factors: Ice pack Activity:Cannot function at least once a week.  Past NSAIDS:  *** Past analgesics: Acetaminophen, Excedrin, lidocaine nasal drops Past abortive triptans: Relpax 40 mg, Treximet, Maxalt, Zomig tablet, sumatriptan 100 mg, sumatriptan NS, Zomig NS, Onzetra Xsail Past abortive ergotamine:  *** Past muscle relaxants:  *** Past anti-emetic:  *** Past antihypertensive medications: Propranolol, amlodipine Past antidepressant medications: Cymbalta, nortriptyline, sertraline, Effexor Past anticonvulsant medications: Topiramate, Lyrica, Depakote Past anti-CGRP: Aimovig 70 mg Past vitamins/Herbal/Supplements:  *** Past antihistamines/decongestants:  *** Other past therapies: Biofeedback  In January 2017, she developed acute onset of low back pain with pain radiating down the posterior thigh, lateral lower leg and dorsum of foot to big toe on the right.There is no associated numbness or weakness.She has taken Flexeril, which is somewhat helpful, but makes her sleepy.Pain is improved but she still notes pain when she stands up. There was no preceding injury. MRI of lumbar spine from 09/26/15 showed postsurgical changes at L4-5 and L5-S1, as well as spinal stenosis at L2-3, but no at the suspected level of L5. She hand a NCV-EMG performed on 09/28/15, which showed evidence of chronic L5 radiculopathy but nothing acute. She was referred to Dr. Renie Ora of pain management, where she was prescribed baclofen and Percocet and underwent bilateral L5/S1 transforaminal lumbar epidural steroid injections. They work but she has had recurrent pain. She underwent L3-4 fusion on 06/15/16.   In 2018, she reported increased numbness and tingling in the feet (worse on the right).  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  Labs from January, including B12, Sjogren's, Sed rate, ANA, and TSH were normal.  B6  was elevated at 130.6.  She was advised to discontinue any B6 containing foods.  Punch skin biopsy was indicative of small fiber neuropathy.  CT myelogram  was ordered, which revealed progressed damage at L2.  She underwent bilateral epidural injection at L2 which worked for two days  PAST MEDICAL HISTORY: Past Medical History:  Diagnosis Date  . Anemia   . Anxiety panic attacks   [name]  . Arthritis    knees  . Cardiomyopathy, nonischemic ([place]) 04/07/2016   EF left ventricule calculated on Lexiscan stress test at 33% with global hypokinesis.   . Chronic back pain   . Chronic neck pain   . Depression   . Dysrhythmia    pvc's with cardiomyopathy  . Family history of adverse reaction to anesthesia    mother has problems waking up  . Fibromyalgia    and neuropathy of feet  . History of kidney stones    lithotripsy  03/2016  . Hyperlipidemia   . Hypertension   . Kidney stone    History  . Kidney stones h/o  . Melanoma ([place]) RUE   [name]  . MGUS (monoclonal gammopathy of unknown significance) 06/2015   no treatment just watching recheck blood work 12/2015  . Migraine    OTC med prn - stress related  . Plantar fasciitis right  . Renal stones 2008/2011/2017  . Seasonal allergies   . SVD (spontaneous vaginal delivery)    x 3  . Tendonitis of foot right    MEDICATIONS: Current Outpatient Medications on File Prior to Visit  Medication Sig Dispense Refill  . azithromycin (ZITHROMAX) 250 MG tablet Take 2 tablets by mouth on first day, then 1 tablet by mouth on days 2 through 5 6 tablet 0  . carvedilol (COREG) 6.25 MG tablet TAKE 1 (ONE) TABLET BY MOUTH TWO TIMES DAILY AS DIRECTEED  1  . Erenumab-aooe (AIMOVIG) 70 MG/ML SOAJ Inject 70 mg into the skin every 30 (thirty) days. (Patient not taking: Reported on 08/21/2018) 1 pen 11  . FLUoxetine (PROZAC) 40 MG capsule Take 80 mg by mouth daily.    Marland Kitchen lisinopril (PRINIVIL,ZESTRIL) 20 MG tablet Take 10 mg by mouth every evening.     Marland Kitchen  LORazepam (ATIVAN) 0.5 MG tablet Take 1-2 tablets (0.5-1 mg total) by mouth 3 (three) times daily as needed for anxiety. Prn anxiety (Patient not taking: Reported on 08/21/2018) 30 tablet 0  . MISC NATURAL PRODUCT OP Take 2 capsules by mouth daily. Omega-Q    . MISC NATURAL PRODUCTS PO Take 2 capsules by mouth 2 (two) times daily. Rejuvenixx    . MISC NATURAL PRODUCTS PO Take 1 capsule by mouth 2 (two) times daily. Optimal- V    . MISC NATURAL PRODUCTS PO Take 1 capsule by mouth 2 (two) times daily. Optimal-M    . MISC NATURAL PRODUCTS PO Take 1 capsule by mouth 2 (two) times daily. MagnaCal-D    . promethazine (PHENERGAN) 25 MG tablet Take 1 tablet (25 mg total) by mouth every 8 (eight) hours as needed for nausea or vomiting. 20 tablet 0  . tiZANidine (ZANAFLEX) 4 MG tablet Take 1 tablet (4 mg total) by mouth every 6 (six) hours as needed for muscle spasms. (Patient not taking: Reported on 08/21/2018) 30 tablet 5   No current facility-administered medications on file prior to visit.     ALLERGIES: Allergies  Allergen Reactions  . Penicillins Shortness Of Breath and Rash    Has patient had a PCN reaction causing immediate rash, facial/tongue/throat swelling, SOB or lightheadedness with hypotension: no Has patient had a PCN reaction causing severe rash involving mucus membranes or skin necrosis:  no Has patient had a PCN reaction that required hospitalization no Has patient had a PCN reaction occurring within the last 10 years: no If all of the above answers are "NO", then may proceed with Cephalosporin use.   . Codeine Other (See Comments)    Manic depressive  . Voltaren [Diclofenac] Nausea And Vomiting and Other (See Comments)    Pt stated "tore stomach up"  . Erythromycin Rash    FAMILY HISTORY: Family History  Problem Relation Age of Onset  . Hypertension Mother   . Depression Mother   . Migraines Mother   . Hypothyroidism Mother   . COPD Father   . Heart failure Father 61  .  Healthy Brother   . Healthy Daughter   . Healthy Son   . Healthy Daughter   . Diabetes Neg Hx    SOCIAL HISTORY: Social History   Socioeconomic History  . Marital status: Married    Spouse name: Not on file  . Number of children: Not on file  . Years of education: Not on file  . Highest education level: Not on file  Occupational History  . Not on file  Social Needs  . Financial resource strain: Not on file  . Food insecurity:    Worry: Not on file    Inability: Not on file  . Transportation needs:    Medical: Not on file    Non-medical: Not on file  Tobacco Use  . Smoking status: Former Smoker    Packs/day: 0.10    Years: 3.00    Pack years: 0.30    Types: Cigarettes    Last attempt to quit: 07/04/1987    Years since quitting: 31.1  . Smokeless tobacco: Never Used  Substance and Sexual Activity  . Alcohol use: Yes    Alcohol/week: 0.0 standard drinks    Comment: rarely   . Drug use: No  . Sexual activity: Yes    Partners: Male    Birth control/protection: Other-see comments, Surgical    Comment: husband had vasectomy; pt had hysterectomy  Lifestyle  . Physical activity:    Days per week: Not on file    Minutes per session: Not on file  . Stress: Not on file  Relationships  . Social connections:    Talks on phone: Not on file    Gets together: Not on file    Attends religious service: Not on file    Active member of club or organization: Not on file    Attends meetings of clubs or organizations: Not on file    Relationship status: Not on file  . Intimate partner violence:    Fear of current or ex partner: Not on file    Emotionally abused: Not on file    Physically abused: Not on file    Forced sexual activity: Not on file  Other Topics Concern  . Not on file  Social History Narrative   Married.  Lives with husband, 1 daughter (in college at [employer]), son; brother-in-law (with Down's syndrome) , had a stroke, in SNF   1 dog.   1 daughter in [place], [place].     Granddaughter in [place], [place] (with her mother, who re-married)   Disabled due to anxiety.  Previously worked as Public relations account executive.   Coaching for [employer]    REVIEW OF SYSTEMS: Constitutional: No fevers, chills, or sweats, no generalized fatigue, change in appetite Eyes: No visual changes, double vision, eye pain Ear, nose and throat:  No hearing loss, ear pain, nasal congestion, sore throat Cardiovascular: No chest pain, palpitations Respiratory:  No shortness of breath at rest or with exertion, wheezes GastrointestinaI: No nausea, vomiting, diarrhea, abdominal pain, fecal incontinence Genitourinary:  No dysuria, urinary retention or frequency Musculoskeletal:  No neck pain, back pain Integumentary: No rash, pruritus, skin lesions Neurological: as above Psychiatric: No depression, insomnia, anxiety Endocrine: No palpitations, fatigue, diaphoresis, mood swings, change in appetite, change in weight, increased thirst Hematologic/Lymphatic:  No purpura, petechiae. Allergic/Immunologic: no itchy/runny eyes, nasal congestion, recent allergic reactions, rashes  PHYSICAL EXAM: *** General: No acute distress.  Patient appears ***-groomed.  *** body habitus. Head:  Normocephalic/atraumatic Eyes:  Fundi examined but not visualized Neck: supple, no paraspinal tenderness, full range of motion Heart:  Regular rate and rhythm Lungs:  Clear to auscultation bilaterally Back: No paraspinal tenderness Neurological Exam: alert and oriented to person, place, and time. Attention span and concentration intact, recent and remote memory intact, fund of knowledge intact.  Speech fluent and not dysarthric, language intact.  CN II-XII intact. Bulk and tone normal, muscle strength 5/5 throughout.  Sensation to light touch, temperature and vibration intact.  Deep tendon reflexes 2+ throughout, toes downgoing.  Finger to nose and heel to shin testing intact.  Gait normal, Romberg  negative.  IMPRESSION: 1.***Migraine without aura, without status migrainosus,***intractable 2.  Small fiber polyneuropathy 3.  Lumbosacral radiculopathy  PLAN: ***  Metta Clines, DO  CC: ***

## 2018-09-03 ENCOUNTER — Ambulatory Visit: Payer: Medicare Other | Admitting: Neurology

## 2018-10-07 ENCOUNTER — Ambulatory Visit: Payer: Medicare Other | Admitting: Neurology

## 2018-10-30 ENCOUNTER — Other Ambulatory Visit: Payer: Self-pay

## 2018-10-30 MED ORDER — PREDNISONE 10 MG (21) PO TBPK: ORAL_TABLET | ORAL | 0 refills | Status: DC

## 2018-11-08 ENCOUNTER — Other Ambulatory Visit: Payer: Self-pay | Admitting: Cardiology

## 2018-11-08 DIAGNOSIS — I10 Essential (primary) hypertension: Secondary | ICD-10-CM

## 2018-11-08 MED ORDER — CARVEDILOL 6.25 MG PO TABS: 6.2500 mg | ORAL_TABLET | Freq: Two times a day (BID) | ORAL | 3 refills | Status: DC

## 2018-12-04 ENCOUNTER — Telehealth: Payer: Self-pay

## 2018-12-04 ENCOUNTER — Other Ambulatory Visit: Payer: Self-pay

## 2018-12-04 ENCOUNTER — Telehealth (INDEPENDENT_AMBULATORY_CARE_PROVIDER_SITE_OTHER): Payer: Medicare Other | Admitting: Neurology

## 2018-12-04 ENCOUNTER — Encounter: Payer: Self-pay | Admitting: Neurology

## 2018-12-04 VITALS — Ht 66.0 in | Wt 157.0 lb

## 2018-12-04 DIAGNOSIS — M5030 Other cervical disc degeneration, unspecified cervical region: Secondary | ICD-10-CM

## 2018-12-04 DIAGNOSIS — G43709 Chronic migraine without aura, not intractable, without status migrainosus: Secondary | ICD-10-CM

## 2018-12-04 DIAGNOSIS — M542 Cervicalgia: Secondary | ICD-10-CM

## 2018-12-04 MED ORDER — GALCANEZUMAB-GNLM 120 MG/ML ~~LOC~~ SOAJ: 120.0000 mg | SUBCUTANEOUS | 11 refills | Status: DC

## 2018-12-04 MED ORDER — GALCANEZUMAB-GNLM 120 MG/ML ~~LOC~~ SOAJ: 240.0000 mg | Freq: Once | SUBCUTANEOUS | 0 refills | Status: AC

## 2018-12-04 NOTE — Telephone Encounter (Signed)
Received fax from [facility] regarding Emgality requiring a Prior Jackelyn Hoehn [id number]  PA initiated

## 2018-12-04 NOTE — Progress Notes (Signed)
Virtual Visit via Video Note The purpose of this virtual visit is to provide medical care while limiting exposure to the novel coronavirus.    Consent was obtained for video visit:  Yes Answered questions that patient had about telehealth interaction:  Yes I discussed the limitations, risks, security and privacy concerns of performing an evaluation and management service by telemedicine. I also discussed with the patient that there may be a patient responsible charge related to this service. The patient expressed understanding and agreed to proceed.  Pt location: Home Physician Location: Home Name of referring provider:  Rita Ohara, MD I connected with Lindsey Rodriguez at patients initiation/request on 12/04/2018 at  8:30 AM EDT by video enabled telemedicine application and verified that I am speaking with the correct person using two identifiers. Pt MRN:  [id number] Pt DOB:  1966/01/16 Video Participants:  [name]   History of Present Illness:  Lindsey Rodriguez is a 53 year old right-handed female with chronic neck and back pain with neuropathy, fibromyalgia, cardiomyopathy, hypertension, plantar fasciitis, and history of kidney sones, depression and lumbosacral radiculopathy who follows up for migraine and small fiber neuropathy.  UPDATE: She started Aimovig 70mg .  She used it for 4 months and every subsequent month, efficacy duration shortened (3 1/2 weeks, then 3 weeks, then 2 1/2 weeks and then 2 week).  In April, she developed increased neck pain which aggravated the headaches.  No specific injury.  She was prescribed a prednisone taper.  It helped for just 2 days.  She has known chronic neck pain status post C4-T2 fusion with degenerative disc disease of the cervical spine.  Headaches are severe and unable to function.  She had a normal eye exam.  Headaches are back of head, usually behind left ear but sometimes on right side and radiates up to the eye.  Associated  tingling sensation. Intensity:  Moderate to severe Duration:  All day Frequency:  daily Frequency of abortive medication: daily Current NSAIDS: ibuprofen Current analgesics:  Tylenol.  Took hydrocodone once  Current triptans:  no Current ergotamine:  no Current anti-emetic:  no Current muscle relaxants:  Tizanidine 4mg  one to twice a day (also for neck stiffness) Current anti-anxiolytic:  lorazepam Current sleep aide:  no Current Antihypertensive medications:  Lisinopril, Coreg Current Antidepressant medications:  fluoxetine 60mg  Current Anticonvulsant medications:  none Current anti-CGRP:  Aimovig 70mg  Current Vitamins/Herbal/Supplements:  CoQ10 30mg  Current Antihistamines/Decongestants:  no Other therapy:  no  Depression:  yes; Anxiety:  yes  Last year, she reported increased numbness and tingling in the feet (worse on the right).  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  Labs from January, including B12, Sjogren's, Sed rate, ANA, and TSH were normal.  B6 was elevated at 130.6.  She was advised to discontinue any B6 containing foods.  Punch skin biopsy was indicative of small fiber neuropathy.  CT myelogram was ordered, which revealed progressed damage at L2.  She underwent bilateral epidural injection at L2 which worked for two days  HISTORY: Onset:  Childhood Location:Varies (back of head, crown, bi-temporal, retro-orbital, band-like) Quality:Retro-orbital throbbing, band-like vice, stabbing at back of head Initial Intensity:4/10 constant, 8/10 when severe; August: Tension 6-9/10; Migraine 9/10 Aura:no Prodrome:no Associated symptoms:  Nausea, photophobia, phonophobia, tunnel vision.  No associated vomiting or unilateral numbness or weakness. Initial Duration:Constant but severe episodes 8 hours (with sumatriptan and naproxen); August: Tension 73min to 3 hours; Migraine until goes to sleep Initial Frequency:Daily but severe episodes occur once  a week;  August: Tension 3 days per week; Migraine once every 2 weeks) Triggers/aggravating factors:  Emotional stress, working at the computer. Relieving factors:Ice pack and laying in dark room Activity:Cannot function at least once a week.  She has chronic neck pain status post C4-T2 fusion.  Last MRI of cervical spine from 11/26/14 showed progression of degenerative disc disease at C3-4 with left paracentral protrusion contacts and deforms the left hemicord.  Past abortive therapy:Relpax 40mg , Treximet, Maxalt, Zomig po, Tramadol, sumatriptan 100mg  w/wo naproxen, sumatriptan NS, Zomig NS, Onzetra Xsail, Lidocaine nasal drops Past preventative therapy:Cymbalta, Topamax, nortriptyline, sertraline, Effexor, zonisamide, propranolol (hypotension), amlodipine, Lyrica (cognitive difficulties), Depakote ER 500mg , gabapentin, biofeedback.  In January 2017, she developed acute onset of low back pain with pain radiating down the posterior thigh, lateral lower leg and dorsum of foot to big toe on the right.There is no associated numbness or weakness.She has taken Flexeril, which is somewhat helpful, but makes her sleepy.Pain is improved but she still notes pain when she stands up. There was no preceding injury. MRI of lumbar spine from 09/26/15 showed postsurgical changes at L4-5 and L5-S1, as well as spinal stenosis at L2-3, but no at the suspected level of L5. She hand a NCV-EMG performed on 09/28/15, which showed evidence of chronic L5 radiculopathy but nothing acute. She was referred to Dr. Renie Ora of pain management, where she was prescribed baclofen and Percocet and underwent bilateral L5/S1 transforaminal lumbar epidural steroid injections. They work but she has had recurrent pain. She underwent L3-4 fusion on 06/15/16.   Past Medical History: Past Medical History:  Diagnosis Date   Anemia    Anxiety panic attacks   [name]   Arthritis    knees   Cardiomyopathy, nonischemic  ([place]) 04/07/2016   EF left ventricule calculated on Lexiscan stress test at 33% with global hypokinesis.    Chronic back pain    Chronic neck pain    Depression    Dysrhythmia    pvc's with cardiomyopathy   Family history of adverse reaction to anesthesia    mother has problems waking up   Fibromyalgia    and neuropathy of feet   History of kidney stones    lithotripsy  03/2016   Hyperlipidemia    Hypertension    Kidney stone    History   Kidney stones h/o   Melanoma ([place]) RUE   [name]   MGUS (monoclonal gammopathy of unknown significance) 06/2015   no treatment just watching recheck blood work 12/2015   Migraine    OTC med prn - stress related   Plantar fasciitis right   Renal stones 2008/2011/2017   Seasonal allergies    SVD (spontaneous vaginal delivery)    x 3   Tendonitis of foot right    Medications: Outpatient Encounter Medications as of 12/04/2018  Medication Sig Note   carvedilol (COREG) 6.25 MG tablet Take 1 tablet (6.25 mg total) by mouth 2 (two) times daily with a meal.    FLUoxetine (PROZAC) 40 MG capsule Take 80 mg by mouth daily.    lisinopril (PRINIVIL,ZESTRIL) 20 MG tablet Take 10 mg by mouth every evening.     LORazepam (ATIVAN) 0.5 MG tablet Take 1-2 tablets (0.5-1 mg total) by mouth 3 (three) times daily as needed for anxiety. Prn anxiety (Patient not taking: Reported on 08/21/2018) 03/13/2018: Uses for headaches and anxiety, prn, about 2x/week   MISC NATURAL PRODUCT OP Take 2 capsules by mouth daily. Omega-Q  MISC NATURAL PRODUCTS PO Take 2 capsules by mouth 2 (two) times daily. Rejuvenixx    MISC NATURAL PRODUCTS PO Take 1 capsule by mouth 2 (two) times daily. Optimal- V    MISC NATURAL PRODUCTS PO Take 1 capsule by mouth 2 (two) times daily. Optimal-M    MISC NATURAL PRODUCTS PO Take 1 capsule by mouth 2 (two) times daily. MagnaCal-D 06/20/2017: 2 capsules twice daily   tiZANidine (ZANAFLEX) 4 MG tablet Take 1 tablet (4 mg  total) by mouth every 6 (six) hours as needed for muscle spasms.    [DISCONTINUED] azithromycin (ZITHROMAX) 250 MG tablet Take 2 tablets by mouth on first day, then 1 tablet by mouth on days 2 through 5    [DISCONTINUED] Erenumab-aooe (AIMOVIG) 70 MG/ML SOAJ Inject 70 mg into the skin every 30 (thirty) days. (Patient not taking: Reported on 08/21/2018) 08/21/2018: Did not take dose that was due yesterday   [DISCONTINUED] predniSONE (STERAPRED UNI-PAK 21 TAB) 10 MG (21) TBPK tablet As directed    [DISCONTINUED] promethazine (PHENERGAN) 25 MG tablet Take 1 tablet (25 mg total) by mouth every 8 (eight) hours as needed for nausea or vomiting.    No facility-administered encounter medications on file as of 12/04/2018.     Allergies: Allergies  Allergen Reactions   Penicillins Shortness Of Breath and Rash    Has patient had a PCN reaction causing immediate rash, facial/tongue/throat swelling, SOB or lightheadedness with hypotension: no Has patient had a PCN reaction causing severe rash involving mucus membranes or skin necrosis: no Has patient had a PCN reaction that required hospitalization no Has patient had a PCN reaction occurring within the last 10 years: no If all of the above answers are "NO", then may proceed with Cephalosporin use.    Codeine Other (See Comments)    Manic depressive   Voltaren [Diclofenac] Nausea And Vomiting and Other (See Comments)    Pt stated "tore stomach up"   Erythromycin Rash    Family History: Family History  Problem Relation Age of Onset   Hypertension Mother    Depression Mother    Migraines Mother    Hypothyroidism Mother    COPD Father    Heart failure Father 11   Healthy Brother    Healthy Daughter    Healthy Son    Healthy Daughter    Diabetes Neg Hx     Social History: Social History   Socioeconomic History   Marital status: Married    Spouse name: Remo Lipps   Number of children: 3   Years of education: Not on file     Highest education level: Not on file  Occupational History   Not on file  Social Needs   Financial resource strain: Not on file   Food insecurity:    Worry: Not on file    Inability: Not on file   Transportation needs:    Medical: Not on file    Non-medical: Not on file  Tobacco Use   Smoking status: Former Smoker    Packs/day: 0.10    Years: 3.00    Pack years: 0.30    Types: Cigarettes    Last attempt to quit: 07/04/1987    Years since quitting: 31.4   Smokeless tobacco: Never Used  Substance and Sexual Activity   Alcohol use: Yes    Alcohol/week: 0.0 standard drinks    Comment: rarely    Drug use: No   Sexual activity: Yes    Partners: Male    Birth  control/protection: Other-see comments, Surgical    Comment: husband had vasectomy; pt had hysterectomy  Lifestyle   Physical activity:    Days per week: Not on file    Minutes per session: Not on file   Stress: Not on file  Relationships   Social connections:    Talks on phone: Not on file    Gets together: Not on file    Attends religious service: Not on file    Active member of club or organization: Not on file    Attends meetings of clubs or organizations: Not on file    Relationship status: Not on file   Intimate partner violence:    Fear of current or ex partner: Not on file    Emotionally abused: Not on file    Physically abused: Not on file    Forced sexual activity: Not on file  Other Topics Concern   Not on file  Social History Narrative   Married.  Lives with husband, 1 daughter (in college at [employer]), son; brother-in-law (with Down's syndrome) , had a stroke, in SNF   1 dog.   1 daughter in [place], [place].   Granddaughter in [place], [place] (with her mother, who re-married)   Disabled due to anxiety.  Previously worked as Public relations account executive.   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   Observations/Objective:   Height 5\' 6"  (1.676 m), weight 157 lb (71.2 kg), last menstrual  period 12/16/2012. No acute distress.  Alert and oriented.  Speech fluent and not dysarthric.  Language intact.  Face symmetric.  Assessment and Plan:   1.  Chronic migraine without aura, without status migrainosus, not intractable 2.  Occipital neuralgia 3.  Degenerative disc disease of cervical spine  Increased migraines appear to be cervicogenic in nature with worsening neck pain, headache radiating from the back of head as well as symptoms associated with occipital neuralgia as well.  May be complicated by medication overuse as well.  1.  Start Emgality 2.  Will check MRI of cervical spine without contrast to evaluate for worsening degenerative changes with subsequent referral to orthopedics or pain management. 3.  Continue tizanidine.   4.  Ibuprofen as needed for neck pain.  I think treatment of neck pain outweighs concern for rebound headache at this time. 5.  Headache diary 6.  Follow up in 4 months.  Follow Up Instructions:    -I discussed the assessment and treatment plan with the patient. The patient was provided an opportunity to ask questions and all were answered. The patient agreed with the plan and demonstrated an understanding of the instructions.   The patient was advised to call back or seek an in-person evaluation if the symptoms worsen or if the condition fails to improve as anticipated.  Dudley Major, DO

## 2018-12-05 ENCOUNTER — Other Ambulatory Visit: Payer: Self-pay

## 2018-12-05 MED ORDER — GALCANEZUMAB-GNLM 120 MG/ML ~~LOC~~ SOAJ: 240.0000 mg | Freq: Once | SUBCUTANEOUS | 0 refills | Status: AC

## 2018-12-05 NOTE — Progress Notes (Signed)
Initiated on Cover My Meds Key: [id number]  Approved [id number]. EMGALITY INJ 120MG /ML is approved through 07/03/2019. For further questions, call [phone]  Called [facility], spoke with Teressa Senter to advise of approval.

## 2018-12-10 ENCOUNTER — Ambulatory Visit: Payer: Medicare Other | Admitting: Neurology

## 2018-12-18 ENCOUNTER — Other Ambulatory Visit: Payer: Self-pay

## 2018-12-18 ENCOUNTER — Inpatient Hospital Stay: Payer: Medicare Other | Attending: Hematology

## 2018-12-18 DIAGNOSIS — Z87891 Personal history of nicotine dependence: Secondary | ICD-10-CM | POA: Diagnosis not present

## 2018-12-18 DIAGNOSIS — I10 Essential (primary) hypertension: Secondary | ICD-10-CM | POA: Diagnosis not present

## 2018-12-18 DIAGNOSIS — F419 Anxiety disorder, unspecified: Secondary | ICD-10-CM | POA: Insufficient documentation

## 2018-12-18 DIAGNOSIS — D472 Monoclonal gammopathy: Secondary | ICD-10-CM

## 2018-12-18 DIAGNOSIS — Z79899 Other long term (current) drug therapy: Secondary | ICD-10-CM | POA: Insufficient documentation

## 2018-12-18 LAB — CMP (CANCER CENTER ONLY)
ALT: 22 U/L (ref 0–44)
AST: 19 U/L (ref 15–41)
Albumin: 4.3 g/dL (ref 3.5–5.0)
Alkaline Phosphatase: 69 U/L (ref 38–126)
Anion gap: 9 (ref 5–15)
BUN: 20 mg/dL (ref 6–20)
CO2: 24 mmol/L (ref 22–32)
Calcium: 9.4 mg/dL (ref 8.9–10.3)
Chloride: 104 mmol/L (ref 98–111)
Creatinine: 0.88 mg/dL (ref 0.44–1.00)
GFR, Est AFR Am: >60 mL/min (ref >60)
GFR, Estimated: >60 mL/min (ref >60)
Glucose, Bld: 88 mg/dL (ref 70–99)
Potassium: 4.5 mmol/L (ref 3.5–5.1)
Sodium: 137 mmol/L (ref 135–145)
Total Bilirubin: 0.5 mg/dL (ref 0.3–1.2)
Total Protein: 7.9 g/dL (ref 6.5–8.1)

## 2018-12-18 LAB — CBC WITH DIFFERENTIAL/PLATELET
Abs Immature Granulocytes: 0.01 10*3/uL (ref 0.00–0.07)
Basophils Absolute: 0.1 10*3/uL (ref 0.0–0.1)
Basophils Relative: 1 %
Eosinophils Absolute: 0.1 10*3/uL (ref 0.0–0.5)
Eosinophils Relative: 2 %
HCT: 39.7 % (ref 36.0–46.0)
Hemoglobin: 13.3 g/dL (ref 12.0–15.0)
Immature Granulocytes: 0 %
Lymphocytes Relative: 34 %
Lymphs Abs: 1.7 10*3/uL (ref 0.7–4.0)
MCH: 33 pg (ref 26.0–34.0)
MCHC: 33.5 g/dL (ref 30.0–36.0)
MCV: 98.5 fL (ref 80.0–100.0)
Monocytes Absolute: 0.3 10*3/uL (ref 0.1–1.0)
Monocytes Relative: 7 %
Neutro Abs: 2.7 10*3/uL (ref 1.7–7.7)
Neutrophils Relative %: 56 %
Platelets: 258 10*3/uL (ref 150–400)
RBC: 4.03 MIL/uL (ref 3.87–5.11)
RDW: 12.3 % (ref 11.5–15.5)
WBC: 4.9 10*3/uL (ref 4.0–10.5)
nRBC: 0 % (ref 0.0–0.2)

## 2018-12-18 NOTE — Progress Notes (Deleted)
Office Visit Note  Patient: Lindsey Rodriguez             Date of Birth: 11-25-1965           MRN: [id number]             PCP: Rita Ohara, MD Referring: Rita Ohara, MD Visit Date: 12/27/2018 Occupation: @GUAROCC @  Subjective:  No chief complaint on file.   History of Present Illness: Lindsey Rodriguez is a 53 y.o. female ***   Activities of Daily Living:  Patient reports morning stiffness for *** {minute/hour:19697}.   Patient {ACTIONS;DENIES/REPORTS:21021675::"Denies"} nocturnal pain.  Difficulty dressing/grooming: {ACTIONS;DENIES/REPORTS:21021675::"Denies"} Difficulty climbing stairs: {ACTIONS;DENIES/REPORTS:21021675::"Denies"} Difficulty getting out of chair: {ACTIONS;DENIES/REPORTS:21021675::"Denies"} Difficulty using hands for taps, buttons, cutlery, and/or writing: {ACTIONS;DENIES/REPORTS:21021675::"Denies"}  No Rheumatology ROS completed.   PMFS History:  Patient Active Problem List   Diagnosis Date Noted  . Lumbar scoliosis 06/15/2016  . Chondrocalcinosis 05/15/2016  . Primary osteoarthritis of both knees 05/15/2016  . Tendinopathy of right shoulder 05/15/2016  . Spondylosis of lumbar region 05/15/2016  . Plantar fasciitis, bilateral 05/15/2016  . Renal calcinosis 05/15/2016  . Malignant melanoma of right upper extremity including shoulder ([place]) 05/15/2016  . Nonischemic cardiomyopathy ([place]) 04/10/2016  . Post op infection 12/18/2015  . Postoperative state 11/24/2015  . Chronic migraine without aura without status migrainosus, not intractable 08/04/2015  . Fibromyalgia 07/26/2015  . MGUS (monoclonal gammopathy of unknown significance) 06/10/2015  . DJD (degenerative joint disease), cervical 03/02/2015  . Migraine headache 09/05/2012  . Fatigue 10/26/2011  . Pure hyperglyceridemia 04/27/2011  . Essential hypertension, benign 04/17/2011  . Vitamin D deficiency 04/17/2011  . Neuropathy 12/22/2010  . Headache 12/22/2010    Past Medical History:  Diagnosis  Date  . Anemia   . Anxiety panic attacks   [name]  . Arthritis    knees  . Cardiomyopathy, nonischemic ([place]) 04/07/2016   EF left ventricule calculated on Lexiscan stress test at 33% with global hypokinesis.   . Chronic back pain   . Chronic neck pain   . Depression   . Dysrhythmia    pvc's with cardiomyopathy  . Family history of adverse reaction to anesthesia    mother has problems waking up  . Fibromyalgia    and neuropathy of feet  . History of kidney stones    lithotripsy  03/2016  . Hyperlipidemia   . Hypertension   . Kidney stone    History  . Kidney stones h/o  . Melanoma ([place]) RUE   [name]  . MGUS (monoclonal gammopathy of unknown significance) 06/2015   no treatment just watching recheck blood work 12/2015  . Migraine    OTC med prn - stress related  . Plantar fasciitis right  . Renal stones 2008/2011/2017  . Seasonal allergies   . SVD (spontaneous vaginal delivery)    x 3  . Tendonitis of foot right    Family History  Problem Relation Age of Onset  . Hypertension Mother   . Depression Mother   . Migraines Mother   . Hypothyroidism Mother   . COPD Father   . Heart failure Father 52  . Healthy Brother   . Healthy Daughter   . Healthy Son   . Healthy Daughter   . Diabetes Neg Hx    Past Surgical History:  Procedure Laterality Date  . ABDOMINAL HYSTERECTOMY    . ABLATION ON ENDOMETRIOSIS N/A 11/24/2015   Procedure: ABLATION ON ENDOMETRIOSIS;  Surgeon: [name], MD;  Location: [facility];  Service: Gynecology;  Laterality: N/A;  . ANTERIOR LAT LUMBAR FUSION Right 06/15/2016   Procedure: RIGHT LUMBAR THREE-FOUR ANTEROLATERAL LUMBAR INTERBODY FUSION WITH LATERAL PLATE;  Surgeon: Erline Levine, MD;  Location: [facility];  Service: Neurosurgery;  Laterality: Right;  . BACK SURGERY  Dr.Stern, last sx Summer 2011   X 4  (L3-S1)  . BACK SURGERY  06/2016  . BLADDER SUSPENSION N/A 11/24/2015   Procedure: TRANSVAGINAL TAPE (TVT) PROCEDURE;  Surgeon: [name], MD;  Location: [facility];  Service: Gynecology;  Laterality: N/A;  . COLONOSCOPY    . CYSTOCELE REPAIR  11/24/2015   Procedure: ANTERIOR REPAIR (CYSTOCELE);  Surgeon: [name], MD;  Location: [facility];  Service: Gynecology;;  . Consuela Mimes N/A 11/24/2015   Procedure: CYSTOSCOPY;  Surgeon: [name], MD;  Location: [facility];  Service: Gynecology;  Laterality: N/A;  . DILATION AND CURETTAGE OF UTERUS    . ENDOMETRIAL ABLATION  08/2000  . EYE SURGERY Bilateral    Lasik   . KIDNEY STONE SURGERY  2011  . LITHOTRIPSY  08/2017  . MELANOMA EXCISION  05/2015   right forearm; [name]  . NECK SURGERY  [name]  Summer 2011   X 4  (C4-T1 Level)  . RHINOPLASTY    . ROBOTIC ASSISTED BILATERAL SALPINGO OOPHERECTOMY Bilateral 11/24/2015   Procedure: ROBOTIC ASSISTED BILATERAL SALPINGO OOPHORECTOMY;  Surgeon: [name], MD;  Location: [facility];  Service: Gynecology;  Laterality: Bilateral;--OVARIES NOT REMOVED PER PATH REPORT  . ROBOTIC ASSISTED TOTAL HYSTERECTOMY WITH SALPINGECTOMY Bilateral 11/24/2015   Procedure: ROBOTIC ASSISTED TOTAL HYSTERECTOMY WITH SALPINGECTOMY;  Surgeon: [name], MD;  Location: [facility];  Service: Gynecology;  Laterality: Bilateral;  . TONSILLECTOMY    . WISDOM TOOTH EXTRACTION     Social History   Social History Narrative   Married.  Lives with husband, 1 daughter (in college at [employer]), son; brother-in-law (with Down's syndrome) , had a stroke, in SNF   1 dog.   1 daughter in [place], [place].   Granddaughter in [place], [place] (with her mother, who re-married)   Disabled due to anxiety.  Previously worked as Public relations account executive.   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   Immunization History  Administered Date(s) Administered  . Influenza Split 03/18/2011, 04/02/2014, 05/01/2014  . Influenza Whole 07/03/2009  . Influenza, Seasonal, Injecte, Preservative Fre 06/04/2012  . Influenza,inj,Quad PF,6+ Mos 03/10/2013, 04/07/2015, 03/02/2016,  03/08/2017, 03/13/2018  . Pneumococcal Conjugate-13 03/08/2017  . Tdap 09/05/2012     Objective: Vital Signs: LMP 12/16/2012    Physical Exam   Musculoskeletal Exam: ***  CDAI Exam: CDAI Score: - Patient Global: -; Provider Global: - Swollen: -; Tender: - Joint Exam   No joint exam has been documented for this visit   There is currently no information documented on the homunculus. Go to the Rheumatology activity and complete the homunculus joint exam.  Investigation: No additional findings.  Imaging: No results found.  Recent Labs: Lab Results  Component Value Date   WBC 4.9 12/18/2018   HGB 13.3 12/18/2018   PLT 258 12/18/2018   NA 137 12/18/2018   K 4.5 12/18/2018   CL 104 12/18/2018   CO2 24 12/18/2018   GLUCOSE 88 12/18/2018   BUN 20 12/18/2018   CREATININE 0.88 12/18/2018   BILITOT 0.5 12/18/2018   ALKPHOS 69 12/18/2018   AST 19 12/18/2018   ALT 22 12/18/2018   PROT 7.9 12/18/2018   ALBUMIN 4.3 12/18/2018  CALCIUM 9.4 12/18/2018   GFRAA >60 12/18/2018    Speciality Comments: No specialty comments available.  Procedures:  No procedures performed Allergies: Penicillins, Codeine, Voltaren [diclofenac], and Erythromycin   Assessment / Plan:     Visit Diagnoses: No diagnosis found.   Orders: No orders of the defined types were placed in this encounter.  No orders of the defined types were placed in this encounter.   Face-to-face time spent with patient was *** minutes. Greater than 50% of time was spent in counseling and coordination of care.  Follow-Up Instructions: No follow-ups on file.   Ofilia Neas, PA-C  Note - This record has been created using Dragon software.  Chart creation errors have been sought, but may not always  have been located. Such creation errors do not reflect on  the standard of medical care.

## 2018-12-19 LAB — KAPPA/LAMBDA LIGHT CHAINS
Kappa free light chain: 9.4 mg/L (ref 3.3–19.4)
Kappa, lambda light chain ratio: 0.63 (ref 0.26–1.65)
Lambda free light chains: 15 mg/L (ref 5.7–26.3)

## 2018-12-20 LAB — MULTIPLE MYELOMA PANEL, SERUM
Albumin SerPl Elph-Mcnc: 3.9 g/dL (ref 2.9–4.4)
Albumin/Glob SerPl: 1.3 (ref 0.7–1.7)
Alpha 1: 0.1 g/dL (ref 0.0–0.4)
Alpha2 Glob SerPl Elph-Mcnc: 0.6 g/dL (ref 0.4–1.0)
B-Globulin SerPl Elph-Mcnc: 1 g/dL (ref 0.7–1.3)
Gamma Glob SerPl Elph-Mcnc: 1.5 g/dL (ref 0.4–1.8)
Globulin, Total: 3.2 g/dL (ref 2.2–3.9)
IgA: 67 mg/dL — ABNORMAL LOW (ref 87–352)
IgG (Immunoglobin G), Serum: 1534 mg/dL (ref 586–1602)
IgM (Immunoglobulin M), Srm: 66 mg/dL (ref 26–217)
M Protein SerPl Elph-Mcnc: 1.1 g/dL — ABNORMAL HIGH
Total Protein ELP: 7.1 g/dL (ref 6.0–8.5)

## 2018-12-21 ENCOUNTER — Ambulatory Visit
Admission: RE | Admit: 2018-12-21 | Discharge: 2018-12-21 | Disposition: A | Discharge status: Home / Self Care | Payer: Medicare Other | Source: Ambulatory Visit | Attending: Neurology | Admitting: Neurology

## 2018-12-21 ENCOUNTER — Other Ambulatory Visit: Payer: Self-pay

## 2018-12-21 DIAGNOSIS — M542 Cervicalgia: Secondary | ICD-10-CM

## 2018-12-21 DIAGNOSIS — M5030 Other cervical disc degeneration, unspecified cervical region: Secondary | ICD-10-CM

## 2018-12-23 ENCOUNTER — Other Ambulatory Visit: Payer: Self-pay

## 2018-12-23 DIAGNOSIS — M5410 Radiculopathy, site unspecified: Secondary | ICD-10-CM

## 2018-12-23 DIAGNOSIS — M5417 Radiculopathy, lumbosacral region: Secondary | ICD-10-CM

## 2018-12-23 DIAGNOSIS — M542 Cervicalgia: Secondary | ICD-10-CM

## 2018-12-23 DIAGNOSIS — M5030 Other cervical disc degeneration, unspecified cervical region: Secondary | ICD-10-CM

## 2018-12-24 NOTE — Progress Notes (Signed)
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 Skeletal survey negative. Bone marrow deferred as per patient choice.  Current Treatment: observation   INTERVAL HISTORY:  [name] is here for her scheduled 6 month follow-up for IgG lambda MGUS. The patient's last visit with [place] was on 06/19/18. The pt reports that she is doing well overall.  The pt reports that she has been having referred pain from her back down her left leg. She has continued follow up with both neurology and orthopedics. She has been having headaches as well as neck pains, and recently had a MRI Cervical spine on 12/21/18. She has had injections in her lower back in the past. She has tried monthly injectable migraine prevention without relief.   The pt denies any changes in her energy levels. She endorses stable neuropathy.  Lab results (12/18/18) of CBC w/diff and CMP is as follows: all values are WNL. 12/18/18 MMP revealed all values WNL except for IgA at 67, M Protein at 1.1g. 12/18/18 SFLC are WNL.  On review of systems, pt reports recent headaches and neck pain, stable energy levels, stable neuropathy, and denies concerns for infections, and any other symptoms.   REVIEW OF SYSTEMS:    A 10+ POINT REVIEW OF SYSTEMS WAS OBTAINED including neurology, dermatology, psychiatry, cardiac, respiratory, lymph, extremities, GI, GU, Musculoskeletal, constitutional, breasts, reproductive, HEENT.  All pertinent positives are noted in the HPI.  All others are negative. . Past Medical History:  Diagnosis Date   Anemia    Anxiety panic attacks   [name]   Arthritis    knees     Cardiomyopathy, nonischemic ([place]) 04/07/2016   EF left ventricule calculated on Lexiscan stress test at 33% with global hypokinesis.    Chronic back pain    Chronic neck pain    Depression    Dysrhythmia    pvc's with cardiomyopathy   Family history of adverse reaction to anesthesia    mother has problems waking up   Fibromyalgia    and neuropathy of feet   History of kidney stones    lithotripsy  03/2016   Hyperlipidemia    Hypertension    Kidney stone    History   Kidney stones h/o   Melanoma ([place]) RUE   [name]   MGUS (monoclonal gammopathy of unknown significance) 06/2015   no treatment just watching recheck blood work 12/2015   Migraine    OTC med prn - stress related   Plantar fasciitis right   Renal stones 2008/2011/2017   Seasonal allergies    SVD (spontaneous vaginal delivery)    x 3   Tendonitis of foot right    Past Surgical History:  Procedure Laterality Date   ABDOMINAL HYSTERECTOMY     ABLATION ON ENDOMETRIOSIS N/A 11/24/2015   Procedure: ABLATION ON ENDOMETRIOSIS;  Surgeon: [name], MD;  Location: [facility];  Service: Gynecology;  Laterality: N/A;   ANTERIOR LAT LUMBAR FUSION Right 06/15/2016   Procedure: RIGHT LUMBAR THREE-FOUR ANTEROLATERAL LUMBAR INTERBODY FUSION WITH LATERAL PLATE;  Surgeon: Erline Levine, MD;  Location: [facility];  Service: Neurosurgery;  Laterality: Right;   BACK SURGERY  Dr.Stern, last sx Summer 2011   X  4  (L3-S1)   BACK SURGERY  06/2016   BLADDER SUSPENSION N/A 11/24/2015   Procedure: TRANSVAGINAL TAPE (TVT) PROCEDURE;  Surgeon: [name], MD;  Location: [facility];  Service: Gynecology;  Laterality: N/A;   COLONOSCOPY     CYSTOCELE REPAIR  11/24/2015   Procedure: ANTERIOR REPAIR (CYSTOCELE);  Surgeon: [name], MD;  Location: [facility];  Service: Gynecology;;   CYSTOSCOPY N/A 11/24/2015   Procedure: Consuela Mimes;  Surgeon: [name], MD;  Location: [facility];  Service: Gynecology;   Laterality: N/A;   DILATION AND CURETTAGE OF UTERUS     ENDOMETRIAL ABLATION  08/2000   EYE SURGERY Bilateral    Lasik    KIDNEY STONE SURGERY  2011   LITHOTRIPSY  08/2017   MELANOMA EXCISION  05/2015   right forearm; [name]   NECK SURGERY  [name]  Summer 2011   X 4  (C4-T1 Level)   RHINOPLASTY     ROBOTIC ASSISTED BILATERAL SALPINGO OOPHERECTOMY Bilateral 11/24/2015   Procedure: ROBOTIC ASSISTED BILATERAL SALPINGO OOPHORECTOMY;  Surgeon: [name], MD;  Location: [facility];  Service: Gynecology;  Laterality: Bilateral;--OVARIES NOT REMOVED PER PATH REPORT   ROBOTIC ASSISTED TOTAL HYSTERECTOMY WITH SALPINGECTOMY Bilateral 11/24/2015   Procedure: ROBOTIC ASSISTED TOTAL HYSTERECTOMY WITH SALPINGECTOMY;  Surgeon: [name], MD;  Location: [facility];  Service: Gynecology;  Laterality: Bilateral;   TONSILLECTOMY     WISDOM TOOTH EXTRACTION      Social History   Tobacco Use   Smoking status: Former Smoker    Packs/day: 0.10    Years: 3.00    Pack years: 0.30    Types: Cigarettes    Quit date: 07/04/1987    Years since quitting: 31.4   Smokeless tobacco: Never Used  Substance Use Topics   Alcohol use: Yes    Alcohol/week: 0.0 standard drinks    Comment: rarely    Drug use: No   ALLERGIES:  is allergic to penicillins; codeine; voltaren [diclofenac]; and erythromycin.  MEDICATIONS:  Current Outpatient Medications  Medication Sig Dispense Refill   carvedilol (COREG) 6.25 MG tablet Take 1 tablet (6.25 mg total) by mouth 2 (two) times daily with a meal. 180 tablet 3   FLUoxetine (PROZAC) 40 MG capsule Take 80 mg by mouth daily.     Galcanezumab-gnlm (EMGALITY) 120 MG/ML SOAJ Inject 120 mg into the skin every 30 (thirty) days. 1 pen 11   lisinopril (PRINIVIL,ZESTRIL) 20 MG tablet Take 10 mg by mouth every evening.      LORazepam (ATIVAN) 0.5 MG tablet Take 1-2 tablets (0.5-1 mg total) by mouth 3 (three) times daily as needed for anxiety. Prn anxiety  (Patient not taking: Reported on 08/21/2018) 30 tablet 0   MISC NATURAL PRODUCT OP Take 2 capsules by mouth daily. Omega-Q     MISC NATURAL PRODUCTS PO Take 2 capsules by mouth 2 (two) times daily. Rejuvenixx     MISC NATURAL PRODUCTS PO Take 1 capsule by mouth 2 (two) times daily. Optimal- V     MISC NATURAL PRODUCTS PO Take 1 capsule by mouth 2 (two) times daily. Optimal-M     MISC NATURAL PRODUCTS PO Take 1 capsule by mouth 2 (two) times daily. MagnaCal-D     tiZANidine (ZANAFLEX) 4 MG tablet Take 1 tablet (4 mg total) by mouth every 6 (six) hours as needed for muscle spasms. 30 tablet 5   No current facility-administered medications for this visit.     PHYSICAL EXAMINATION: ECOG PERFORMANCE STATUS: 2  Vitals:  12/25/18 1347  BP: 127/83  Pulse: 70  Resp: 18  Temp: 98.4 F (36.9 C)  SpO2: 99%    Filed Weights   12/25/18 1347  Weight: 167 lb 6.4 oz (75.9 kg)   .Body mass index is 27.02 kg/m.  GENERAL:alert, in no acute distress and comfortable SKIN: no acute rashes, no significant lesions EYES: conjunctiva are pink and non-injected, sclera anicteric OROPHARYNX: MMM, no exudates, no oropharyngeal erythema or ulceration NECK: supple, no JVD LYMPH:  no palpable lymphadenopathy in the cervical, axillary or inguinal regions LUNGS: clear to auscultation b/l with normal respiratory effort HEART: regular rate & rhythm ABDOMEN:  normoactive bowel sounds , non tender, not distended. No palpable hepatosplenomegaly.  Extremity: no pedal edema PSYCH: alert & oriented x 3 with fluent speech NEURO: no focal motor/sensory deficits   LABORATORY DATA:   I have reviewed the data as listed  . CBC Latest Ref Rng & Units 12/18/2018 06/12/2018 12/19/2017  WBC 4.0 - 10.5 K/uL 4.9 5.5 5.7  Hemoglobin 12.0 - 15.0 g/dL 13.3 14.0 13.3  Hematocrit 36.0 - 46.0 % 39.7 42.1 38.7  Platelets 150 - 400 K/uL 258 303 280    . CMP Latest Ref Rng & Units 12/18/2018 06/12/2018 03/13/2018    Glucose 70 - 99 mg/dL 88 79 80  BUN 6 - 20 mg/dL 20 13 -  Creatinine 0.44 - 1.00 mg/dL 0.88 0.84 -  Sodium 135 - 145 mmol/L 137 137 -  Potassium 3.5 - 5.1 mmol/L 4.5 5.1 -  Chloride 98 - 111 mmol/L 104 104 -  CO2 22 - 32 mmol/L 24 24 -  Calcium 8.9 - 10.3 mg/dL 9.4 9.9 -  Total Protein 6.5 - 8.1 g/dL 7.9 8.5(H) -  Total Bilirubin 0.3 - 1.2 mg/dL 0.5 0.6 -  Alkaline Phos 38 - 126 U/L 69 84 -  AST 15 - 41 U/L 19 21 -  ALT 0 - 44 U/L 22 19 -        RADIOGRAPHIC STUDIES: I have personally reviewed the radiological images as listed and agreed with the findings in the report. Mr Cervical Spine Wo Contrast  Result Date: 12/21/2018 CLINICAL DATA:  Extreme headaches. Question cervicogenic or occipital neuralgia. LEFT-sided head and neck pain. Prior cervical fusion. EXAM: MRI CERVICAL SPINE WITHOUT CONTRAST TECHNIQUE: Multiplanar, multisequence MR imaging of the cervical spine was performed. No intravenous contrast was administered. COMPARISON:  Plain films 06/13/2017 at [facility]. MRI cervical spine 11/26/2014. FINDINGS: Alignment: Straightening of the normal cervical lordosis. Degenerative anterolisthesis C3-4 2 mm, facet mediated. Vertebrae: Limited by susceptibility artifact due to plate and screws, both anterior and posterior. No worrisome osseous lesion. Anterior plate and screws extend from C4 through T2. Fractured C4 screws better seen on plain film. Posterior arthrodesis at C4-5 with lateral mass screws and plate. Cord: Within limits for detection due to susceptibility, no abnormal cord signal over the fusion construct. There is cord Flattening at C3-4 on the LEFT. No definite abnormal cord signal. Posterior Fossa, vertebral arteries, paraspinal tissues: Unremarkable. Vertebral flow voids are maintained. No tonsillar herniation. Disc levels: C2-3: BILATERAL facet arthropathy. Annular bulge. No definite impingement. C3-4: Central and leftward protrusion is chronic. There has been partial regression  of the disc herniation since 2016. 2 mm of anterolisthesis related to facet arthropathy is noted and may be slightly greater. There is slight LEFT sided cord flattening and slight LEFT C4 foraminal narrowing. C4-5 through T1-T2: Limited visualization due to hardware. Suspected cord flattening at C4-5, related to some type  of spurring with metallic artifact, on the RIGHT associated with RIGHT-sided foraminal narrowing. No definite impingement elsewhere across the fusion segments. IMPRESSION: Status post C4-T2 fusion. No acute findings, although suspected chronic deformity of the RIGHT hemicord at C4-5 is redemonstrated. Central and leftward protrusion at C3-4 is chronic. Slight regression since 2016. 2 mm anterolisthesis, with slight LEFT-sided cord flattening and slight LEFT C4 foraminal narrowing. No impingement at C2-3 or abnormalities at the skull base/foramen magnum level. Mild cervical facet disease at C2-3 and C3-4, but no bone marrow edema or dominant joint effusion to identify a pain generator. Electronically Signed   By: Staci Righter M.D.   On: 12/21/2018 15:58    ASSESSMENT & PLAN:   53 y.o. 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  Previous UPEP and skeletal survey negative. Patient has no evidence of anemia, renal failure on labs. No hypercalcemia. No new bone pain. SPEP shows stable M-spike at 1g/dl with no significant increase over the last couple of follow-ups. SFLC ratio within normal limits  PLAN: -Discussed pt labwork from 12/18/18; blood counts and chemistries are normal. M Protein stable over the last year at 1.1g. Serum free light chains normal. -Reviewed and discussed the 12/21/18 MRI Cervical spine which did not reveal any overt indication/concern for bone tumors. Continue follow up with Orthopedics and neurology. -The pt shows  no clinical or lab progression of her monoclonal paraproteinemia at this time.  -No indication for further treatment at this time.  -Discussed that a BM Bx would not be strongly indicated at this time given the absence of any CRAB criteria and the stability of her clinical and lab course. The pt agrees with this plan. -Discussed that monoclonal paraproteinemia does present risk of osteoporosis, and would recommend seeking a bone density study with her PCP Dr. Rita Ohara, replacing Vitamin D appropriately and optimizing bone health. -Per pt, most recent ECHO showed the LV EF normalized to 55%, continuing to reassure against amyloidosis -Radiculopathy worsened, do not feel that this is neuropathy related to her MGUS as her M protein is IgG lambda, not IgM which is more commonly associated with neuropathies  -Discussed that it is reasonable to continue watching the patient clinically and with labs every 6 months at this time, and no overt indication to pursue a BM Bx -Will see the pt back in 6 months  2) Systolic nonischemic CHF patient previously noted improvement in her EF from 33% to 45%. She subsequently noted her EF normalized to 55% as of 06/19/18 clinic visit.  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 Plan -Continue management per dermatology and ongoing skin surveillance. -continue sun protection.  4) Peripheral radiculopathy /neuropathy -Is currently followed by Vertell Limber of Neurosurgery as well as Dr Tomi Likens of Neurology who have begun to work this up further. -Most recent electromyography showed evidence of radiculopathy, mild in degree, and without evidence of sensorimotor polyneuropathy.   Cannot r/o small fiber neuropathy -  Evidence of this on epidermal nerve fiber density analysis. Plan -Mx per  neurology  Continue followup with primary care physician, rheumatology, cardiology, and neurology    RTC with Dr Irene Limbo with labs in 6 months (plz schedule labs a week prior  to clinic visit).   The total time spent in the appt was 20 minutes and more than 50% was on counseling and direct patient cares.    Sullivan Lone MD [place] AAHIVMS [facility] Hematology/Oncology Physician [facility]  (Office):       [phone] (Work cell):  [phone] (Fax):           [phone]  I, Baldwin Jamaica, am acting as a scribe for Dr. Sullivan Lone.   .I have reviewed the above documentation for accuracy and completeness, and I agree with the above. Brunetta Genera MD

## 2018-12-25 ENCOUNTER — Other Ambulatory Visit: Payer: Self-pay

## 2018-12-25 ENCOUNTER — Inpatient Hospital Stay: Payer: Medicare Other | Admitting: Hematology

## 2018-12-25 ENCOUNTER — Telehealth: Payer: Self-pay | Admitting: Hematology

## 2018-12-25 VITALS — BP 127/83 | HR 70 | Temp 98.4°F | Resp 18 | Ht 66.0 in | Wt 167.4 lb

## 2018-12-25 DIAGNOSIS — R195 Other fecal abnormalities: Secondary | ICD-10-CM | POA: Insufficient documentation

## 2018-12-25 DIAGNOSIS — M5417 Radiculopathy, lumbosacral region: Secondary | ICD-10-CM

## 2018-12-25 DIAGNOSIS — I10 Essential (primary) hypertension: Secondary | ICD-10-CM

## 2018-12-25 DIAGNOSIS — Z87891 Personal history of nicotine dependence: Secondary | ICD-10-CM

## 2018-12-25 DIAGNOSIS — E559 Vitamin D deficiency, unspecified: Secondary | ICD-10-CM

## 2018-12-25 DIAGNOSIS — D419 Neoplasm of uncertain behavior of unspecified urinary organ: Secondary | ICD-10-CM

## 2018-12-25 DIAGNOSIS — Z79899 Other long term (current) drug therapy: Secondary | ICD-10-CM

## 2018-12-25 DIAGNOSIS — D472 Monoclonal gammopathy: Secondary | ICD-10-CM

## 2018-12-25 DIAGNOSIS — N951 Menopausal and female climacteric states: Secondary | ICD-10-CM | POA: Insufficient documentation

## 2018-12-25 DIAGNOSIS — N926 Irregular menstruation, unspecified: Secondary | ICD-10-CM | POA: Insufficient documentation

## 2018-12-25 DIAGNOSIS — M5030 Other cervical disc degeneration, unspecified cervical region: Secondary | ICD-10-CM

## 2018-12-25 DIAGNOSIS — M5410 Radiculopathy, site unspecified: Secondary | ICD-10-CM

## 2018-12-25 DIAGNOSIS — M542 Cervicalgia: Secondary | ICD-10-CM

## 2018-12-25 NOTE — Telephone Encounter (Signed)
Scheduled per los. Patient declined printout  

## 2018-12-25 NOTE — Progress Notes (Signed)
amb  

## 2018-12-26 NOTE — Progress Notes (Signed)
Office Visit Note  Patient: Lindsey Rodriguez             Date of Birth: 08-02-1965           MRN: [id number]             PCP: Rita Ohara, MD Referring: Rita Ohara, MD Visit Date: 12/30/2018 Occupation: @GUAROCC @  Subjective:  Chronic neck pain  History of Present Illness: Lindsey Rodriguez is a 53 y.o. female with history of fibromyalgia and chondrocalcinosis.  She continues to have intermittent fibromyalgia flares.  She has generalized muscle aches muscle tenderness.  She reports that she tries to remain active and has been performing home exercises.  She used to go to the gym on a regular basis but has been unable to do the COVID-19 pandemic.  She reports that she has been having worsening neck pain.  She has had several fusions in the past.  She reports that her neck pain has been causing bilateral radiculopathy.  She is also been having more frequent and severe tension headaches and migraine headaches.  She had an MRI of the C-spine performed on 12/21/2018.  She was recently referred to pain management as well as neurosurgery for further evaluation.  She continues to have intermittent pain in bilateral hands and bilateral knee joints.  She denies any joint swelling at this time.  She states her knee joint pain is activity dependent.  She has intermittent left plantar fasciitis.  She takes Tylenol as needed for pain relief for her neck and joint pain.  Activities of Daily Living:  Patient reports morning stiffness for 30 minutes.   Patient Reports nocturnal pain.  Difficulty dressing/grooming: Denies Difficulty climbing stairs: Denies Difficulty getting out of chair: Denies Difficulty using hands for taps, buttons, cutlery, and/or writing: Denies  Review of Systems  Constitutional: Positive for fatigue.  HENT: Negative for mouth sores, mouth dryness and nose dryness.   Eyes: Negative for pain, itching, visual disturbance and dryness.  Respiratory: Negative for cough, hemoptysis,  shortness of breath and difficulty breathing.   Cardiovascular: Negative for chest pain, palpitations, hypertension, irregular heartbeat and swelling in legs/feet.  Gastrointestinal: Negative for blood in stool, constipation and diarrhea.  Endocrine: Negative for increased urination.  Genitourinary: Negative for painful urination and pelvic pain.  Musculoskeletal: Positive for arthralgias, joint pain, joint swelling and morning stiffness. Negative for myalgias, muscle weakness, muscle tenderness and myalgias.  Skin: Negative for color change, pallor, rash, hair loss, nodules/bumps, redness, skin tightness, ulcers and sensitivity to sunlight.  Allergic/Immunologic: Negative for susceptible to infections.  Neurological: Positive for headaches. Negative for numbness and memory loss.  Hematological: Negative for swollen glands.  Psychiatric/Behavioral: Positive for sleep disturbance. Negative for depressed mood and confusion. The patient is not nervous/anxious.     PMFS History:  Patient Active Problem List   Diagnosis Date Noted   Irregular periods 12/25/2018   Menopausal symptom 12/25/2018   Occult blood in stools 12/25/2018   Lumbar scoliosis 06/15/2016   Chondrocalcinosis 05/15/2016   Primary osteoarthritis of both knees 05/15/2016   Tendinopathy of right shoulder 05/15/2016   Spondylosis of lumbar region 05/15/2016   Plantar fasciitis, bilateral 05/15/2016   Renal calcinosis 05/15/2016   Malignant melanoma of right upper extremity including shoulder ([place]) 05/15/2016   Nonischemic cardiomyopathy ([place]) 04/10/2016   Post op infection 12/18/2015   Postoperative state 11/24/2015   Chronic migraine without aura without status migrainosus, not intractable 08/04/2015   Fibromyalgia 07/26/2015   MGUS (monoclonal gammopathy  of unknown significance) 06/10/2015   DJD (degenerative joint disease), cervical 03/02/2015   Migraine headache 09/05/2012   Fatigue 10/26/2011    Pure hyperglyceridemia 04/27/2011   Essential hypertension, benign 04/17/2011   Vitamin D deficiency 04/17/2011   Neuropathy 12/22/2010   Headache 12/22/2010    Past Medical History:  Diagnosis Date   Anemia    Anxiety panic attacks   [name]   Arthritis    knees   Cardiomyopathy, nonischemic ([place]) 04/07/2016   EF left ventricule calculated on Lexiscan stress test at 33% with global hypokinesis.    Chronic back pain    Chronic neck pain    Depression    Dysrhythmia    pvc's with cardiomyopathy   Family history of adverse reaction to anesthesia    mother has problems waking up   Fibromyalgia    and neuropathy of feet   History of kidney stones    lithotripsy  03/2016   Hyperlipidemia    Hypertension    Kidney stone    History   Kidney stones h/o   Melanoma ([facility]) RUE   [name]   MGUS (monoclonal gammopathy of unknown significance) 06/2015   no treatment just watching recheck blood work 12/2015   Migraine    OTC med prn - stress related   Plantar fasciitis right   Renal stones 2008/2011/2017   Seasonal allergies    SVD (spontaneous vaginal delivery)    x 3   Tendonitis of foot right    Family History  Problem Relation Age of Onset   Hypertension Mother    Depression Mother    Migraines Mother    Hypothyroidism Mother    COPD Father    Heart failure Father 72   Healthy Brother    Healthy Daughter    Healthy Son    Healthy Daughter    Diabetes Neg Hx    Past Surgical History:  Procedure Laterality Date   ABDOMINAL HYSTERECTOMY     ABLATION ON ENDOMETRIOSIS N/A 11/24/2015   Procedure: ABLATION ON ENDOMETRIOSIS;  Surgeon: [name], MD;  Location: [facility];  Service: Gynecology;  Laterality: N/A;   ANTERIOR LAT LUMBAR FUSION Right 06/15/2016   Procedure: RIGHT LUMBAR THREE-FOUR ANTEROLATERAL LUMBAR INTERBODY FUSION WITH LATERAL PLATE;  Surgeon: Erline Levine, MD;  Location: [facility];  Service: Neurosurgery;   Laterality: Right;   BACK SURGERY  Dr.Stern, last sx Summer 2011   X 4  (L3-S1)   BACK SURGERY  06/2016   BLADDER SUSPENSION N/A 11/24/2015   Procedure: TRANSVAGINAL TAPE (TVT) PROCEDURE;  Surgeon: [name], MD;  Location: [facility];  Service: Gynecology;  Laterality: N/A;   COLONOSCOPY     CYSTOCELE REPAIR  11/24/2015   Procedure: ANTERIOR REPAIR (CYSTOCELE);  Surgeon: [name], MD;  Location: [facility];  Service: Gynecology;;   CYSTOSCOPY N/A 11/24/2015   Procedure: Consuela Mimes;  Surgeon: [name], MD;  Location: [facility];  Service: Gynecology;  Laterality: N/A;   DILATION AND CURETTAGE OF UTERUS     ENDOMETRIAL ABLATION  08/2000   EYE SURGERY Bilateral    Lasik    KIDNEY STONE SURGERY  2011   LITHOTRIPSY  08/2017   MELANOMA EXCISION  05/2015   right forearm; [name]   NECK SURGERY  [name]  Summer 2011   X 4  (C4-T1 Level)   RHINOPLASTY     ROBOTIC ASSISTED BILATERAL SALPINGO OOPHERECTOMY Bilateral 11/24/2015   Procedure: ROBOTIC ASSISTED BILATERAL SALPINGO OOPHORECTOMY;  Surgeon: [name], MD;  Location:  [facility];  Service: Gynecology;  Laterality: Bilateral;--OVARIES NOT REMOVED PER PATH REPORT   ROBOTIC ASSISTED TOTAL HYSTERECTOMY WITH SALPINGECTOMY Bilateral 11/24/2015   Procedure: ROBOTIC ASSISTED TOTAL HYSTERECTOMY WITH SALPINGECTOMY;  Surgeon: [name], MD;  Location: [facility];  Service: Gynecology;  Laterality: Bilateral;   TONSILLECTOMY     WISDOM TOOTH EXTRACTION     Social History   Social History Narrative   Married.  Lives with husband, 1 daughter (in college at [employer]), son; brother-in-law (with Down's syndrome) , had a stroke, in SNF   1 dog.   1 daughter in [place], [place].   Granddaughter in [place], [place] (with her mother, who re-married)   Disabled due to anxiety.  Previously worked as Public relations account executive.   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   Immunization History  Administered Date(s) Administered    Hepatitis B 10/31/2008   Influenza Split 03/18/2011, 04/02/2014, 05/01/2014   Influenza Whole 07/03/2009   Influenza, Seasonal, Injecte, Preservative Fre 06/04/2012   Influenza,inj,Quad PF,6+ Mos 03/10/2013, 04/07/2015, 03/02/2016, 03/08/2017, 03/13/2018   Influenza-Unspecified 05/03/2012, 04/07/2015   MMR 07/03/1981   Pneumococcal Conjugate-13 03/08/2017   Tdap 07/03/1981, 09/05/2012     Objective: Vital Signs: BP 121/76 (BP Location: Left Arm, Patient Position: Sitting, Cuff Size: Normal)    Pulse (!) 59    Resp 13    Ht 5' 6.5" (1.689 m)    Wt 167 lb 6.4 oz (75.9 kg)    LMP 12/16/2012    BMI 26.61 kg/m    Physical Exam Vitals signs and nursing note reviewed.  Constitutional:      Appearance: She is well-developed.  HENT:     Head: Normocephalic and atraumatic.  Eyes:     Conjunctiva/sclera: Conjunctivae normal.  Neck:     Musculoskeletal: Normal range of motion.  Cardiovascular:     Rate and Rhythm: Normal rate and regular rhythm.     Heart sounds: Normal heart sounds.  Pulmonary:     Effort: Pulmonary effort is normal.     Breath sounds: Normal breath sounds.  Abdominal:     General: Bowel sounds are normal.     Palpations: Abdomen is soft.  Lymphadenopathy:     Cervical: No cervical adenopathy.  Skin:    General: Skin is warm and dry.     Capillary Refill: Capillary refill takes less than 2 seconds.  Neurological:     Mental Status: She is alert and oriented to person, place, and time.  Psychiatric:        Behavior: Behavior normal.      Musculoskeletal Exam: Generalized hyperalgesia and positive tender points on exam.  C-spine limited range of motion with discomfort.  She has midline spinal tenderness in the C-spine, thoracic spine, lumbar spine.  She has bilateral SI joint tenderness.  Shoulder joints, elbow joints, wrist joints, MCPs and PIPs, DIPs good range of motion no synovitis.  She has mild PIP and DIP synovial thickening consistent with  osteoarthritis of bilateral hands.  Hip joints, knee joints, ankle joints, MCPs, PIPs, DIPs good range of motion no synovitis.  No warmth or effusion bilateral knee joints. Bilateral knee crepitus.  No tenderness or swelling of ankle joints.  She has PIP and DIP synovial thickening consistent with osteoarthritis of bilateral feet.  She has no tenderness or synovitis of MTP joints.  CDAI Exam: CDAI Score: -- Patient Global: --; Provider Global: -- Swollen: --; Tender: -- Joint Exam   No joint exam  has been documented for this visit   There is currently no information documented on the homunculus. Go to the Rheumatology activity and complete the homunculus joint exam.  Investigation: No additional findings.  Imaging: Mr Cervical Spine Wo Contrast  Result Date: 12/21/2018 CLINICAL DATA:  Extreme headaches. Question cervicogenic or occipital neuralgia. LEFT-sided head and neck pain. Prior cervical fusion. EXAM: MRI CERVICAL SPINE WITHOUT CONTRAST TECHNIQUE: Multiplanar, multisequence MR imaging of the cervical spine was performed. No intravenous contrast was administered. COMPARISON:  Plain films 06/13/2017 at [facility]. MRI cervical spine 11/26/2014. FINDINGS: Alignment: Straightening of the normal cervical lordosis. Degenerative anterolisthesis C3-4 2 mm, facet mediated. Vertebrae: Limited by susceptibility artifact due to plate and screws, both anterior and posterior. No worrisome osseous lesion. Anterior plate and screws extend from C4 through T2. Fractured C4 screws better seen on plain film. Posterior arthrodesis at C4-5 with lateral mass screws and plate. Cord: Within limits for detection due to susceptibility, no abnormal cord signal over the fusion construct. There is cord Flattening at C3-4 on the LEFT. No definite abnormal cord signal. Posterior Fossa, vertebral arteries, paraspinal tissues: Unremarkable. Vertebral flow voids are maintained. No tonsillar herniation. Disc levels: C2-3: BILATERAL  facet arthropathy. Annular bulge. No definite impingement. C3-4: Central and leftward protrusion is chronic. There has been partial regression of the disc herniation since 2016. 2 mm of anterolisthesis related to facet arthropathy is noted and may be slightly greater. There is slight LEFT sided cord flattening and slight LEFT C4 foraminal narrowing. C4-5 through T1-T2: Limited visualization due to hardware. Suspected cord flattening at C4-5, related to some type of spurring with metallic artifact, on the RIGHT associated with RIGHT-sided foraminal narrowing. No definite impingement elsewhere across the fusion segments. IMPRESSION: Status post C4-T2 fusion. No acute findings, although suspected chronic deformity of the RIGHT hemicord at C4-5 is redemonstrated. Central and leftward protrusion at C3-4 is chronic. Slight regression since 2016. 2 mm anterolisthesis, with slight LEFT-sided cord flattening and slight LEFT C4 foraminal narrowing. No impingement at C2-3 or abnormalities at the skull base/foramen magnum level. Mild cervical facet disease at C2-3 and C3-4, but no bone marrow edema or dominant joint effusion to identify a pain generator. Electronically Signed   By: Staci Righter M.D.   On: 12/21/2018 15:58    Recent Labs: Lab Results  Component Value Date   WBC 4.9 12/18/2018   HGB 13.3 12/18/2018   PLT 258 12/18/2018   NA 137 12/18/2018   K 4.5 12/18/2018   CL 104 12/18/2018   CO2 24 12/18/2018   GLUCOSE 88 12/18/2018   BUN 20 12/18/2018   CREATININE 0.88 12/18/2018   BILITOT 0.5 12/18/2018   ALKPHOS 69 12/18/2018   AST 19 12/18/2018   ALT 22 12/18/2018   PROT 7.9 12/18/2018   ALBUMIN 4.3 12/18/2018   CALCIUM 9.4 12/18/2018   GFRAA >60 12/18/2018    Speciality Comments: No specialty comments available.  Procedures:  No procedures performed Allergies: Penicillins, Codeine, Voltaren [diclofenac], and Erythromycin   Assessment / Plan:     Visit Diagnoses: Chondrocalcinosis - She  has not had any recent flares. She has no synovitis or joint effusions on exam. She was advised to notify us if she develops increased joint pain or joint swelling.  She will follow up in 6 months.   Primary osteoarthritis of both knees - She has intermittent pain in both knee joints, which tends to be activity dependent.  She has no warmth or effusion of knee joints.  She  has good ROM bilaterally . Bilateral knee crepitus noted.  She was encouraged to stay active and exercise on a regular basis.   DDD (degenerative disc disease), cervical - s/p fusion - Chronic pain.  She has limited range of motion with discomfort.  She has bilateral radiculopathy.  She has been having worsening neck pain and stiffness.  She is been having worsening tension and migraine headaches recently.  She has been experiencing headaches and neck pain at night which has been causing interrupted sleep.  She had an MRI of the C-spine performed on 12/21/2018 which revealed status post C4-T2 fusion central and leftward protrusion of C3-C4 is chronic mild cervical facet disease at C2-C3 and C3-C4 noted.  She was referred to neurosurgery and pain management.  Spondylosis of lumbar region - Epidural injections in the past - Chronic pain.  She has limited ROM with discomfort.  Midline spinal tenderness noted.   Plantar fasciitis, bilateral - She has intermittent symptoms of plantar fasciitis  Bilaterally, worse in the left foot.  She wears proper fitting shoes.   Fibromyalgia - She continues to have recurrent fibromyalgia flares.  She has generalized hyperalgesia and positive tender points.  She has generalized muscle aches and muscle tenderness due to fibromyalgia.  She takes Zanaflex very sparingly for muscle spasms.  She was referred to pain management due to increased neck pain and stiffness.  She has currently been taking Tylenol as needed for pain relief.  She remains very active and exercises on a regular basis.  We discussed the  importance of having a regular exercise regimen.  She used to go to the gym but has been unable to do COVID-19.  She has been performing home exercises on a regular basis.  She has been having interrupted sleep at night due to the discomfort she experiences in her neck as well as frequent migraines and tension headaches.  Other medical conditions are listed as follows:   Nonischemic cardiomyopathy ([facility])   Neuropathy   Renal calcinosis   History of hypertension   MGUS (monoclonal gammopathy of unknown significance)   Malignant melanoma of right upper extremity including shoulder ([facility])  Orders: No orders of the defined types were placed in this encounter.  No orders of the defined types were placed in this encounter.     Follow-Up Instructions: Return in about 6 months (around 07/01/2019) for Fibromyalgia, Osteoarthritis, DDD.   Ofilia Neas, PA-C   I examined and evaluated the patient with Lindsey Sams PA.  Patient continues to have some generalized pain and discomfort.  Most the pain is coming from her lumbar spine.  I do not see any synovitis on examination.  She will be seeing a spine specialist and pain management .The plan of care was discussed as noted above.  Bo Merino, MD Note - This record has been created using Editor, commissioning.  Chart creation errors have been sought, but may not always  have been located. Such creation errors do not reflect on  the standard of medical care.

## 2018-12-27 ENCOUNTER — Ambulatory Visit: Payer: Self-pay | Admitting: Rheumatology

## 2018-12-30 ENCOUNTER — Encounter: Payer: Self-pay | Admitting: Rheumatology

## 2018-12-30 ENCOUNTER — Ambulatory Visit (INDEPENDENT_AMBULATORY_CARE_PROVIDER_SITE_OTHER): Payer: Medicare Other | Admitting: Rheumatology

## 2018-12-30 ENCOUNTER — Other Ambulatory Visit: Payer: Self-pay

## 2018-12-30 VITALS — BP 121/76 | HR 59 | Resp 13 | Ht 66.5 in | Wt 167.4 lb

## 2018-12-30 DIAGNOSIS — M797 Fibromyalgia: Secondary | ICD-10-CM

## 2018-12-30 DIAGNOSIS — N29 Other disorders of kidney and ureter in diseases classified elsewhere: Secondary | ICD-10-CM

## 2018-12-30 DIAGNOSIS — M722 Plantar fascial fibromatosis: Secondary | ICD-10-CM

## 2018-12-30 DIAGNOSIS — M1120 Other chondrocalcinosis, unspecified site: Secondary | ICD-10-CM | POA: Diagnosis not present

## 2018-12-30 DIAGNOSIS — M5030 Other cervical disc degeneration, unspecified cervical region: Secondary | ICD-10-CM

## 2018-12-30 DIAGNOSIS — C4361 Malignant melanoma of right upper limb, including shoulder: Secondary | ICD-10-CM

## 2018-12-30 DIAGNOSIS — M47816 Spondylosis without myelopathy or radiculopathy, lumbar region: Secondary | ICD-10-CM

## 2018-12-30 DIAGNOSIS — D472 Monoclonal gammopathy: Secondary | ICD-10-CM

## 2018-12-30 DIAGNOSIS — M170 Bilateral primary osteoarthritis of knee: Secondary | ICD-10-CM | POA: Diagnosis not present

## 2018-12-30 DIAGNOSIS — I428 Other cardiomyopathies: Secondary | ICD-10-CM

## 2018-12-30 DIAGNOSIS — Z8679 Personal history of other diseases of the circulatory system: Secondary | ICD-10-CM

## 2018-12-30 DIAGNOSIS — G629 Polyneuropathy, unspecified: Secondary | ICD-10-CM

## 2019-01-20 ENCOUNTER — Other Ambulatory Visit: Payer: Self-pay

## 2019-01-20 MED ORDER — LISINOPRIL 20 MG PO TABS: 10.0000 mg | ORAL_TABLET | Freq: Every evening | ORAL | 1 refills | Status: DC

## 2019-03-12 ENCOUNTER — Other Ambulatory Visit: Payer: Medicare Other

## 2019-03-12 ENCOUNTER — Other Ambulatory Visit: Payer: Self-pay

## 2019-03-12 DIAGNOSIS — E7800 Pure hypercholesterolemia, unspecified: Secondary | ICD-10-CM

## 2019-03-12 LAB — LIPID PANEL
Chol/HDL Ratio: 3.6 ratio (ref 0.0–4.4)
Cholesterol, Total: 274 mg/dL — ABNORMAL HIGH (ref 100–199)
HDL: 76 mg/dL (ref >39)
LDL Chol Calc (NIH): 177 mg/dL — ABNORMAL HIGH (ref 0–99)
Triglycerides: 120 mg/dL (ref 0–149)
VLDL Cholesterol Cal: 21 mg/dL (ref 5–40)

## 2019-03-18 NOTE — Patient Instructions (Addendum)
HEALTH MAINTENANCE RECOMMENDATIONS:  It is recommended that you get at least 30 minutes of aerobic exercise at least 5 days/week (for weight loss, you may need as much as 60-90 minutes). This can be any activity that gets your heart rate up. This can be divided in 10-15 minute intervals if needed, but try and build up your endurance at least once a week.  Weight bearing exercise is also recommended twice weekly.  Eat a healthy diet with lots of vegetables, fruits and fiber.  "Colorful" foods have a lot of vitamins (ie green vegetables, tomatoes, red peppers, etc).  Limit sweet tea, regular sodas and alcoholic beverages, all of which has a lot of calories and sugar.  Up to 1 alcoholic drink daily may be beneficial for women (unless trying to lose weight, watch sugars).  Drink a lot of water.  Calcium recommendations are 1200-1500 mg daily (1500 mg for postmenopausal women or women without ovaries), and vitamin D 1000 IU daily.  This should be obtained from diet and/or supplements (vitamins), and calcium should not be taken all at once, but in divided doses.  Monthly self breast exams and yearly mammograms for women over the age of 33 is recommended.  Sunscreen of at least SPF 30 should be used on all sun-exposed parts of the skin when outside between the hours of 10 am and 4 pm (not just when at beach or pool, but even with exercise, golf, tennis, and yard work!)  Use a sunscreen that says "broad spectrum" so it covers both UVA and UVB rays, and make sure to reapply every 1-2 hours.  Remember to change the batteries in your smoke detectors when changing your clock times in the spring and fall. Carbon monoxide detectors are recommended for your home.  Use your seat belt every time you are in a car, and please drive safely and not be distracted with cell phones and texting while driving.   Lindsey Rodriguez , Thank you for taking time to come for your Medicare Wellness Visit. I appreciate your ongoing  commitment to your health goals. Please review the following plan we discussed and let me know if I can assist you in the future.   This is a list of the screening recommended for you and due dates:  Health Maintenance  Topic Date Due   HIV Screening  06/22/1981   Flu Shot  02/01/2019   Mammogram  05/24/2019   Tetanus Vaccine  09/06/2022   Colon Cancer Screening  01/22/2023   Your HIV screen was done when you last donated blood, don't worry about above date.  I recommend getting the new shingles vaccine (Shingrix). You will need to get it rom the pharmacy, as it is covered by Medicare Part D.  It is a series of 2 injections, spaced 2 months apart.  Try and limit red meat to 2x/week, and cut back on egg yolks to maybe 2/week (you can eat more whites).   Fat and Cholesterol Restricted Eating Plan Getting too much fat and cholesterol in your diet may cause health problems. Choosing the right foods helps keep your fat and cholesterol at normal levels. This can keep you from getting certain diseases.  Meal planning  At meals, divide your plate into four equal parts: ? Fill one-half of your plate with vegetables and green salads. ? Fill one-fourth of your plate with whole grains. ? Fill one-fourth of your plate with low-fat (lean) protein foods.  Eat fish that is high in omega-3 fats  at least two times a week. This includes mackerel, tuna, sardines, and salmon.  Eat foods that are high in fiber, such as whole grains, beans, apples, broccoli, carrots, peas, and barley. General tips   Work with your doctor to lose weight if you need to.  Avoid: ? Foods with added sugar. ? Fried foods. ? Foods with partially hydrogenated oils.  Limit alcohol intake to no more than 1 drink a day for nonpregnant women and 2 drinks a day for men. One drink equals 12 oz of beer, 5 oz of wine, or 1 oz of hard liquor.   Reading food labels  Check food labels for: ? Trans fats. ? Partially  hydrogenated oils. ? Saturated fat (g) in each serving. ? Cholesterol (mg) in each serving. ? Fiber (g) in each serving.  Choose foods with healthy fats, such as: ? Monounsaturated fats. ? Polyunsaturated fats. ? Omega-3 fats.  Choose grain products that have whole grains. Look for the word "whole" as the first word in the ingredient list. Cooking  Cook foods using low-fat methods. These include baking, boiling, grilling, and broiling.  Eat more home-cooked foods. Eat at restaurants and buffets less often.  Avoid cooking using saturated fats, such as butter, cream, palm oil, palm kernel oil, and coconut oil.   Recommended foods  Fruits  All fresh, canned (in natural juice), or frozen fruits. Vegetables  Fresh or frozen vegetables (raw, steamed, roasted, or grilled). Green salads. Grains  Whole grains, such as whole wheat or whole grain breads, crackers, cereals, and pasta. Unsweetened oatmeal, bulgur, barley, quinoa, or brown rice. Corn or whole wheat flour tortillas. Meats and other protein foods  Ground beef (85% or leaner), grass-fed beef, or beef trimmed of fat. Skinless chicken or [place]. Ground chicken or [place]. Pork trimmed of fat. All fish and seafood. Egg whites. Dried beans, peas, or lentils. Unsalted nuts or seeds. Unsalted canned beans. Nut butters without added sugar or oil. Dairy  Low-fat or nonfat dairy products, such as skim or 1% milk, 2% or reduced-fat cheeses, low-fat and fat-free ricotta or cottage cheese, or plain low-fat and nonfat yogurt. Fats and oils  Tub margarine without trans fats. Light or reduced-fat mayonnaise and salad dressings. Avocado. Olive, canola, sesame, or safflower oils. The items listed above may not be a complete list of foods and beverages you can eat. Contact a dietitian for more information.  Foods to avoid Fruits  Canned fruit in heavy syrup. Fruit in cream or butter sauce. Fried fruit. Vegetables  Vegetables cooked in  cheese, cream, or butter sauce. Fried vegetables. Grains  White bread. White pasta. White rice. Cornbread. Bagels, pastries, and croissants. Crackers and snack foods that contain trans fat and hydrogenated oils. Meats and other protein foods  Fatty cuts of meat. Ribs, chicken wings, bacon, sausage, bologna, salami, chitterlings, fatback, hot dogs, bratwurst, and packaged lunch meats. Liver and organ meats. Whole eggs and egg yolks. Chicken and [place] with skin. Fried meat. Dairy  Whole or 2% milk, cream, half-and-half, and cream cheese. Whole milk cheeses. Whole-fat or sweetened yogurt. Full-fat cheeses. Nondairy creamers and whipped toppings. Processed cheese, cheese spreads, and cheese curds. Beverages  Alcohol. Sugar-sweetened drinks such as sodas, lemonade, and fruit drinks. Fats and oils  Butter, stick margarine, lard, shortening, ghee, or bacon fat. Coconut, palm kernel, and palm oils. Sweets and desserts  Corn syrup, sugars, honey, and molasses. Candy. Jam and jelly. Syrup. Sweetened cereals. Cookies, pies, cakes, donuts, muffins, and ice cream. The items listed  above may not be a complete list of foods and beverages you should avoid. Contact a dietitian for more information.  Summary  Choosing the right foods helps keep your fat and cholesterol at normal levels. This can keep you from getting certain diseases.  At meals, fill one-half of your plate with vegetables and green salads.  Eat high-fiber foods, like whole grains, beans, apples, carrots, peas, and barley.  Limit added sugar, saturated fats, alcohol, and fried foods. This information is not intended to replace advice given to you by your health care provider. Make sure you discuss any questions you have with your health care provider. Document Released: 12/19/2011 Document Revised: 02/20/2018 Document Reviewed: 03/06/2017 [employer] Patient Education  2020 [employer].

## 2019-03-18 NOTE — Progress Notes (Signed)
Chief Complaint  Patient presents with  . Medicare Wellness    nonfasting AWV/CPE with pelvic. Her main concern is that she is being treated for HA's and she is also having LBP-they will also treat that. Anxiety is sky high but Dr.Kaur treats that.   . Flu Vaccine    she would like a flu vaccine but she if flying tomorrow so she would like to come back.     Lindsey Rodriguez is a 53 y.o. female who presents for annual wellness visit and follow-up on chronic medical conditions.  She had lipids drawn prior to her visit, see below for results. She has the following concerns:  She is complaining of fatigue, dry hair, hair loss, dry skin, dry mouth, weight gain.  She is having more anxiety (not as much depression), and is concerned she could have a thyroid problem.  She feels cold a lot.   Hyperlipidemia:She previously took fenofibrate for high TG, but lipids remained good after a trial off medication.  Last check had normal TG, but LDL was significantly higher than the year prior.  Recent check (see below), had even higher LDL. Red meat 3-4 times/week.  Uses ground [place] a lot. Some burgers. +butter. Uses coconut oil. 3-4 eggs/week.  [name], but Lotrel was stopped after her weight loss. She is now under the care of Dr. Nadyne Coombes for cardiomyopathy, and is on lisinopril and carvedilol. She has been doing well, without any e/o CHF, EF had improved to 55% on last check in 2018.  Last visit was in 06/2018 with Dr. Einar Gip.  He mentioned her high cholesterol, and that pt declined statin, wanted to work on diet. BP's have been running 90-120/60's-70's. Only rarely under 100 (often times is higher related to pain). Feels dizzy a couple of times a week, when she is out and about, unable to check at that time.  She often has pain at that time, so not likely low. Dizziness is further described as feeling off-balance, rather than lightheaded. Shedenies chest pain, palpitations, edema.  She  continues to follow "the next 56 days", since 08/2015, low carb, "good fats", lean protein, lots of vegetables and water (tries for 90-100 oz daily). Cut out pork, soy, wheat, corn, potatoesand beets. Her goal is to maintain 150#.  She sees Dr. Tomi Likens for her headaches, last seen in June.  At that time, she was having increased migraines, possibly cervicogenic in nature, having worsening neck pain.  She was started on Emgality. She used Aimovig for about 6 months, helped at first, but didn't seem to last long enough. Then changed to [facility] expensive, and didn't seem to help. She still uses tizanidine prn, not using ibuprofen.   Previously tried Depakote, stopped due to hair loss, and reports it didn't work.. She had MRI done 12/2018: IMPRESSION: Status post C4-T2 fusion. No acute findings, although suspected chronic deformity of the RIGHT hemicord at C4-5 is redemonstrated. Central and leftward protrusion at C3-4 is chronic. Slight regression since 2016. 2 mm anterolisthesis, with slight LEFT-sided cord flattening and slight LEFT C4 foraminal narrowing. No impingement at C2-3 or abnormalities at the skull base/foramen magnum level. Mild cervical facet disease at C2-3 and C3-4, but no bone marrow edema or dominant joint effusion to identify a pain generator.  Chronic back and neck pain. S/p back surgery in 06/2016 by Dr. Vertell Limber (R L3-4 fusion). When she last saw neurosurgeon (in July, after the MRI above), it was felt that her pain wasn't radicular.  She had trigger point injection. It helped a little. She recently had f/u, and the plan is L C234 MBB to assess facets as source of pain. She had the first "test injection", which helped.  She has another test injection set for next week, and if helpful, the plan is for a nerve ablation.  She had developed neurogenic bladder s/p hysterectomy and bladder tack (11/2015).She was started on Myrbetriq by Dr. Elray Mcgregor by urologist(Dr.  Vickii Penna, in [facility]). She has been off Myrbetriq for about a year, and is still doing well. Has some occasional urge incontinence. She also sees him for kidney stones, s/p lithotripsy. No further kidney stones (last treatment 03/2018).  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 Last visit was 12/2018 with Dr. Irene Limbo, M Protein stable over the last year.  Dr. Irene Limbo reported that monoclonal paraproteinemia presents risks of osteoporosis, DEXA recommended, as well as checking vitamin D level.  He has ordered Vit D level to be checked with her 06/2019 labs prior to next visit. It doesn't appear that DEXA was arranged.  She is under the care ofDr. Deveshwarforchondrocalcinosis, OA, DJD, and fibromyalgia. She was last seen in June.  No recent flares of chondrocalcinosis.    She continues to see [name] for depression and anxiety. Anxiety has been flaring recently. Her dose of prozac was increased to 60m at the beginning of the year.  Some home stressors are resolving (the cause of stress then).  She uses lorazepam 0.535mprn--some days 2-3/day, many days none.  Needs it about 3x/week. Not currently getting any counseling.   Immunization History  Administered Date(s) Administered  . Hepatitis B 10/31/2008  . Influenza Split 03/18/2011, 04/02/2014, 05/01/2014  . Influenza Whole 07/03/2009  . Influenza, Seasonal, Injecte, Preservative Fre 06/04/2012  . Influenza,inj,Quad PF,6+ Mos 03/10/2013, 04/07/2015, 03/02/2016, 03/08/2017, 03/13/2018  . Influenza-Unspecified 05/03/2012, 04/07/2015  . MMR 07/03/1981  . Pneumococcal Conjugate-13 03/08/2017  . Tdap 07/03/1981, 09/05/2012   Last Pap smear: per Dr. HoPhilis Piqueapprox 10/2014; s/p hysterectomy(for benign reasons) Last mammogram: 05/2018 Last colonoscopy: 12/2017, normal (previously had polyp); 5 yr f/u rec per Dr. MaCollene Maresast DEXA: never Dentist: twice yearly Ophtho: yearly Exercise:personal trainer 90 minutes 2x/week (cardio,  weights), walks 20 minutes or 10 minutes of stairs 3x/week. Vitamin D last checked here 02/2016, normal at 35. On same supplements.  Other doctors caring for patient include: Neuro: Dr. JaTomi LikensI: Dr. MaCollene MaresYN: Dr. HoPhilis Piqueno longer sees) Cardiologist: Dr. GaEinar Gipncologist: Dr. KaIrene Limboodiatrist: Dr. RePaulla Dollyno longer sees) Rheumatologist: Dr. DeEstanislado Pandyeurosurg: Dr. MaRennis HardingDr. SaPayton Mccallumermatologist: Dr. GoDelman Cheadlerologist: Dr. PuHarlow AsaH.Pt) Psych: Dr. KaToy Carephtho: DeBobbie Stackentist: Dr. AmGabriela Eves Depression screen:  negative Fall screen: lost balance and tripped once or twice; no injuries. Once while out, once up the stairs in her home. Functional status survey: remarkable for  some trouble with walking stairs (due to back), leakage of urine (infrequent urge incont.) Mini-cog screen: normal (score of 5)--normal recall and normal numbers; clock drawing actually incorrect, had long and small hands backwards (said 5:45 rather than 9:30).  See full questionnaires in epic   End of Life Discussion:  Patient has a living will and medical power of attorney  Past Medical History:  Diagnosis Date  . Anemia   . Anxiety panic attacks   Dr. KaToy Care. Arthritis    knees  . Cardiomyopathy, nonischemic ([place]/12/2015   EF left ventricule calculated on Lexiscan stress test at 33% with  global hypokinesis.   . Chronic back pain   . Chronic neck pain   . Depression   . Dysrhythmia    pvc's with cardiomyopathy  . Family history of adverse reaction to anesthesia    mother has problems waking up  . Fibromyalgia    and neuropathy of feet  . History of kidney stones    lithotripsy  03/2016  . Hyperlipidemia   . Hypertension   . Kidney stone    History  . Kidney stones h/o  . Melanoma ([place]) RUE   [name]  . MGUS (monoclonal gammopathy of unknown significance) 06/2015   no treatment just watching recheck blood work 12/2015  . Migraine    OTC med prn - stress related   . Plantar fasciitis right  . Renal stones 2008/2011/2017  . Seasonal allergies   . SVD (spontaneous vaginal delivery)    x 3  . Tendonitis of foot right    Past Surgical History:  Procedure Laterality Date  . ABDOMINAL HYSTERECTOMY    . ABLATION ON ENDOMETRIOSIS N/A 11/24/2015   Procedure: ABLATION ON ENDOMETRIOSIS;  Surgeon: [name], MD;  Location: [facility];  Service: Gynecology;  Laterality: N/A;  . ANTERIOR LAT LUMBAR FUSION Right 06/15/2016   Procedure: RIGHT LUMBAR THREE-FOUR ANTEROLATERAL LUMBAR INTERBODY FUSION WITH LATERAL PLATE;  Surgeon: Erline Levine, MD;  Location: [facility];  Service: Neurosurgery;  Laterality: Right;  . BACK SURGERY  Dr.Stern, last sx Summer 2011   X 4  (L3-S1)  . BACK SURGERY  06/2016  . BLADDER SUSPENSION N/A 11/24/2015   Procedure: TRANSVAGINAL TAPE (TVT) PROCEDURE;  Surgeon: [name], MD;  Location: [facility];  Service: Gynecology;  Laterality: N/A;  . COLONOSCOPY    . CYSTOCELE REPAIR  11/24/2015   Procedure: ANTERIOR REPAIR (CYSTOCELE);  Surgeon: [name], MD;  Location: [facility];  Service: Gynecology;;  . Consuela Mimes N/A 11/24/2015   Procedure: CYSTOSCOPY;  Surgeon: [name], MD;  Location: [facility];  Service: Gynecology;  Laterality: N/A;  . DILATION AND CURETTAGE OF UTERUS    . ENDOMETRIAL ABLATION  08/2000  . EYE SURGERY Bilateral    Lasik   . KIDNEY STONE SURGERY  2011  . LITHOTRIPSY  08/2017 (L), 03/2018 (R)  . MELANOMA EXCISION  05/2015   right forearm; [name]  . NECK SURGERY  [name]  Summer 2011   X 4  (C4-T1 Level)  . RHINOPLASTY    . ROBOTIC ASSISTED BILATERAL SALPINGO OOPHERECTOMY Bilateral 11/24/2015   Procedure: ROBOTIC ASSISTED BILATERAL SALPINGO OOPHORECTOMY;  Surgeon: [name], MD;  Location: [facility];  Service: Gynecology;  Laterality: Bilateral;--OVARIES NOT REMOVED PER PATH REPORT  . ROBOTIC ASSISTED TOTAL HYSTERECTOMY WITH SALPINGECTOMY Bilateral 11/24/2015   Procedure: ROBOTIC ASSISTED TOTAL  HYSTERECTOMY WITH SALPINGECTOMY;  Surgeon: [name], MD;  Location: [facility];  Service: Gynecology;  Laterality: Bilateral;  . TONSILLECTOMY    . WISDOM TOOTH EXTRACTION      Social History   Socioeconomic History  . Marital status: Married    Spouse name: Remo Lipps  . Number of children: 3  . Years of education: Not on file  . Highest education level: Not on file  Occupational History  . Not on file  Social Needs  . Financial resource strain: Not on file  . Food insecurity    Worry: Not on file    Inability: Not on file  . Transportation needs    Medical: Not on file    Non-medical: Not on file  Tobacco  Use  . Smoking status: Former Smoker    Packs/day: 0.10    Years: 3.00    Pack years: 0.30    Types: Cigarettes    Quit date: 07/04/1987    Years since quitting: 31.7  . Smokeless tobacco: Never Used  Substance and Sexual Activity  . Alcohol use: Yes    Alcohol/week: 0.0 standard drinks    Comment: rarely   . Drug use: No  . Sexual activity: Yes    Partners: Male    Birth control/protection: Other-see comments, Surgical    Comment: husband had vasectomy; pt had hysterectomy  Lifestyle  . Physical activity    Days per week: Not on file    Minutes per session: Not on file  . Stress: Not on file  Relationships  . Social Herbalist on phone: Not on file    Gets together: Not on file    Attends religious service: Not on file    Active member of club or organization: Not on file    Attends meetings of clubs or organizations: Not on file    Relationship status: Not on file  . Intimate partner violence    Fear of current or ex partner: Not on file    Emotionally abused: Not on file    Physically abused: Not on file    Forced sexual activity: Not on file  Other Topics Concern  . Not on file  Social History Narrative   Married.  Lives with husband,    1 daughter is taking a gap year with [employer] (college advising).   Son moved out 07/2018, local, 1  granddaughter from him (who lives with the mother).   brother-in-law (with Down's syndrome) , had a stroke, in SNF   1 dog.   1 daughter in [place], [place].   Disabled due to anxiety.  Previously worked as Public relations account executive.   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    Family History  Problem Relation Age of Onset  . Hypertension Mother   . Depression Mother   . Migraines Mother   . Hypothyroidism Mother   . COPD Father   . Heart failure Father 42  . Healthy Brother   . Healthy Daughter   . Healthy Son   . Healthy Daughter   . Diabetes Neg Hx     Outpatient Encounter Medications as of 03/19/2019  Medication Sig Note  . carvedilol (COREG) 6.25 MG tablet Take 1 tablet (6.25 mg total) by mouth 2 (two) times daily with a meal.   . FLUoxetine (PROZAC) 40 MG capsule Take 80 mg by mouth daily.   Marland Kitchen lisinopril (ZESTRIL) 20 MG tablet Take 0.5 tablets (10 mg total) by mouth every evening.   Marland Kitchen LORazepam (ATIVAN) 0.5 MG tablet Take 1-2 tablets (0.5-1 mg total) by mouth 3 (three) times daily as needed for anxiety. Prn anxiety 03/19/2019: Uses about 3x/week, sometimes 2-3x/day, for anxiety (per [name])  . MISC NATURAL PRODUCT OP Take 2 capsules by mouth daily. Omega-Q   . MISC NATURAL PRODUCTS PO Take 2 capsules by mouth 2 (two) times daily. Rejuvenixx   . MISC NATURAL PRODUCTS PO Take 1 capsule by mouth 2 (two) times daily. Optimal- V   . MISC NATURAL PRODUCTS PO Take 1 capsule by mouth 2 (two) times daily. Optimal-M   . MISC NATURAL PRODUCTS PO Take 1 capsule by mouth 2 (two) times daily. MagnaCal-D 06/20/2017: 2 capsules twice daily  .  tiZANidine (ZANAFLEX) 4 MG tablet Take 1 tablet (4 mg total) by mouth every 6 (six) hours as needed for muscle spasms. 03/19/2019: Prn, about 2x/week  . methocarbamol (ROBAXIN) 500 MG tablet Take 500 mg by mouth 3 (three) times daily. 03/19/2019: As needed  . [DISCONTINUED] Galcanezumab-gnlm (EMGALITY) 120 MG/ML SOAJ Inject 120 mg into the skin  every 30 (thirty) days.    No facility-administered encounter medications on file as of 03/19/2019.     Allergies  Allergen Reactions  . Penicillins Shortness Of Breath and Rash    Has patient had a PCN reaction causing immediate rash, facial/tongue/throat swelling, SOB or lightheadedness with hypotension: no Has patient had a PCN reaction causing severe rash involving mucus membranes or skin necrosis: no Has patient had a PCN reaction that required hospitalization no Has patient had a PCN reaction occurring within the last 10 years: no If all of the above answers are "NO", then may proceed with Cephalosporin use.   . Codeine Other (See Comments)    Manic depressive  . Voltaren [Diclofenac] Nausea And Vomiting and Other (See Comments)    Pt stated "tore stomach up"  . Erythromycin Rash     ROS: The patient denies anorexia, fever, vision changes, decreased hearing, ear pain, sore throat, breast concerns, chest pain, palpitations, syncope(none since 08/2016), dyspnea on exertion, cough, swelling, nausea, vomiting, diarrhea, constipation, abdominal pain, melena, hematochezia, indigestion/heartburn, hematuria, dysuria, vaginal bleeding, discharge, odor or itch, genital lesions, tremor, suspicious skin lesions, depression, abnormal bleeding/bruising, or enlarged lymph nodes. Some vaginal dryness.  Hot/cold at night, has a hard time regulating her temperature at night.  Rare flush during the day. +urinary urgency and urge incontinence--infrequent/rare Up once a night to void. Increased anxiety recently. Neuropathy in feet (intolerant of lyrica)--cold, tingly Headaches, back and neck pain per HPI some allergies/congestion.    PHYSICAL EXAM:  BP 110/68   Pulse 60   Temp 98.2 F (36.8 C) (Tympanic)   Ht _0  (1.676 m)   Wt 162 lb 6.4 oz (73.7 kg)   LMP 12/16/2012   BMI 26.21 kg/m   Wt Readings from Last 3 Encounters:  03/19/19 162 lb 6.4 oz (73.7 kg)  12/30/18 167 lb 6.4 oz  (75.9 kg)  12/25/18 167 lb 6.4 oz (75.9 kg)     General Appearance:  Alert, cooperative, no distress, appears stated age.  Head:  Normocephalic, without obvious abnormality, atraumatic, nontender  Eyes:  PERRL, conjunctiva/corneas clear, EOM's intact, fundi benign  Ears:  Normal TM's and external ear canals  Nose: Not examined, wearing mask due to COVID-19 pandemic  Throat: Not examined, wearing mask due to COVID-19 pandemic  Neck: Supple, no lymphadenopathy; thyroid: no enlargement/ tenderness/nodules; no carotid bruit or JVD  Back:  Diffusely tender down spine, paraspinous muscles, entire back.  Lungs:  Clear to auscultation bilaterally without wheezes, rales or ronchi; respirations unlabored  Chest Wall:  No tenderness or deformity  Heart:  regular rate and rhythm,no murmur, rub or gallop  Breast Exam:  no nipple inversion, discharge, skin dimpling, breast masses or axillary lymphadenopathy. Fibroglandular changes noted, nontender  Abdomen:  Soft, non-tender, nondistended, normoactive bowel sounds, no masses, no hepatosplenomegaly  Genitalia:  normal external genitalia without lesions. Normal bimanual exam. Uterus surgically absent. No adnexal masses or tenderness  Rectal: Normal sphincter tone, no mass. Heme negative stool  Extremities: No clubbing, cyanosis or edema. High arches  Pulses: 2+ and symmetric all extremities  Skin: Skin color, texture, turgor normal, no rashes or lesions  Lymph nodes: Cervical, supraclavicular, and axillary nodes normal  Neurologic: Normal strength, sensation and gait; reflexes 2+ and symmetric  Psych: Normal mood, affect, hygiene and grooming. Speech is a little slow, but doesn't seem quite as slow/deliberate or have as many pauses as at prior visits.  Lab Results  Component Value Date   CHOL 274 (H) 03/12/2019   HDL 76 03/12/2019   LDLCALC 144 (H) 03/08/2017   TRIG 120 03/12/2019    CHOLHDL 3.6 03/12/2019  LDL 177  Other recent labs reviewed: Lab Results  Component Value Date   WBC 4.9 12/18/2018   HGB 13.3 12/18/2018   HCT 39.7 12/18/2018   MCV 98.5 12/18/2018   PLT 258 12/18/2018     Chemistry      Component Value Date/Time   NA 137 12/18/2018 0802   NA 136 06/20/2017 0857   K 4.5 12/18/2018 0802   K 4.4 06/20/2017 0857   CL 104 12/18/2018 0802   CO2 24 12/18/2018 0802   CO2 25 06/20/2017 0857   BUN 20 12/18/2018 0802   BUN 11.2 06/20/2017 0857   CREATININE 0.88 12/18/2018 0802   CREATININE 0.9 06/20/2017 0857      Component Value Date/Time   CALCIUM 9.4 12/18/2018 0802   CALCIUM 9.9 06/20/2017 0857   ALKPHOS 69 12/18/2018 0802   ALKPHOS 64 06/20/2017 0857   AST 19 12/18/2018 0802   AST 21 06/20/2017 0857   ALT 22 12/18/2018 0802   ALT 19 06/20/2017 0857   BILITOT 0.5 12/18/2018 0802   BILITOT 0.51 06/20/2017 0857        ASSESSMENT/PLAN:  Annual physical exam - Plan: POCT Urinalysis DIP (Proadvantage Device)  Medicare annual wellness visit, subsequent  Mixed hyperlipidemia - LDL signficantly higher; pt doesn't admit much change in diet; has some other sx wants thyroid checked. TG now normal  Chronic migraine without aura without status migrainosus, not intractable - under care of Dr. Tomi Likens  MGUS (monoclonal gammopathy of unknown significance) - stable, under care of hematologist - Plan: DG Bone Density  Fibromyalgia - stable; also has chronic back pain, DDD. Getting test injections, poss ablation in her future  Nonischemic cardiomyopathy (HCC) - stable  Neuropathy  Postmenopausal estrogen deficiency - Plan: DG Bone Density  At high risk for osteoporosis - Plan: DG Bone Density  Fatigue, unspecified type - Plan: TSH  Weight gain - Plan: TSH  Hair loss - Plan: TSH  Pure hypercholesterolemia - Plan: TSH   Recommended counseling if anxiety not improving  Hypercholesterolemia--rec statin.  Pt declines--wants to have  thyroid checked given other symptoms. If thyroid normal, will want dietary trial (given that LDL was normal a few years ago). Send results to Dr. Einar Gip  DEXA ordered--Dr. Irene Limbo mentioned 12/2018 visit that monoclonal paraproteinemia presents risks of osteoporosis, DEXA recommended. He has ordered Vit D level to be checked with her 06/2019 labs prior to next visit.  Discussed monthly self breast exams and yearly mammograms; at least 30 minutes of aerobic activity at least 5 days/week, weight-bearing exercise at least 2x/wk; proper sunscreen use reviewed; healthy diet, including goals of calcium and vitamin D intake and alcohol recommendations (less than or equal to 1 drink/day) reviewed; regular seatbelt use; changing batteries in smoke detectors. Immunization recommendations discussed--flu shot declined today, going out of town--will get when she returns.Shingrix recommended needs to get from pharmacy [facility]).Pneumovax age 89.Colonoscopy recommendations reviewed, UTD, due again 01/2023.  F/u 1 year, sooner prn. If TSH nl, will need 3 mo f/u lipid  scheduled (dietary trial).   Medicare Attestation I have personally reviewed: The patient's medical and social history Their use of alcohol, tobacco or illicit drugs Their current medications and supplements The patient's functional ability including ADLs,fall risks, home safety risks, cognitive, and hearing and visual impairment Diet and physical activities Evidence for depression or mood disorders  The patient's weight, height, BMI, and visual acuity have been recorded in the chart.  I have made referrals, counseling, and provided education to the patient based on review of the above and I have provided the patient with a written personalized care plan for preventive services.

## 2019-03-19 ENCOUNTER — Encounter: Payer: Self-pay | Admitting: Family Medicine

## 2019-03-19 ENCOUNTER — Other Ambulatory Visit: Payer: Self-pay

## 2019-03-19 ENCOUNTER — Ambulatory Visit: Payer: Medicare Other | Admitting: Family Medicine

## 2019-03-19 VITALS — BP 110/68 | HR 60 | Temp 98.2°F | Ht 66.0 in | Wt 162.4 lb

## 2019-03-19 DIAGNOSIS — D472 Monoclonal gammopathy: Secondary | ICD-10-CM

## 2019-03-19 DIAGNOSIS — G629 Polyneuropathy, unspecified: Secondary | ICD-10-CM

## 2019-03-19 DIAGNOSIS — Z9189 Other specified personal risk factors, not elsewhere classified: Secondary | ICD-10-CM

## 2019-03-19 DIAGNOSIS — M797 Fibromyalgia: Secondary | ICD-10-CM

## 2019-03-19 DIAGNOSIS — Z780 Asymptomatic menopausal state: Secondary | ICD-10-CM

## 2019-03-19 DIAGNOSIS — I428 Other cardiomyopathies: Secondary | ICD-10-CM

## 2019-03-19 DIAGNOSIS — L659 Nonscarring hair loss, unspecified: Secondary | ICD-10-CM

## 2019-03-19 DIAGNOSIS — E782 Mixed hyperlipidemia: Secondary | ICD-10-CM

## 2019-03-19 DIAGNOSIS — R5383 Other fatigue: Secondary | ICD-10-CM

## 2019-03-19 DIAGNOSIS — Z0000 Encounter for general adult medical examination without abnormal findings: Secondary | ICD-10-CM | POA: Diagnosis not present

## 2019-03-19 DIAGNOSIS — E7800 Pure hypercholesterolemia, unspecified: Secondary | ICD-10-CM

## 2019-03-19 DIAGNOSIS — R635 Abnormal weight gain: Secondary | ICD-10-CM

## 2019-03-19 DIAGNOSIS — G43709 Chronic migraine without aura, not intractable, without status migrainosus: Secondary | ICD-10-CM | POA: Diagnosis not present

## 2019-03-19 LAB — POCT URINALYSIS DIP (PROADVANTAGE DEVICE)
Bilirubin, UA: NEGATIVE
Blood, UA: NEGATIVE
Glucose, UA: NEGATIVE mg/dL
Ketones, POC UA: NEGATIVE mg/dL
Leukocytes, UA: NEGATIVE
Nitrite, UA: NEGATIVE
Protein Ur, POC: NEGATIVE mg/dL
Specific Gravity, Urine: 1.015
Urobilinogen, Ur: NEGATIVE
pH, UA: 7.5 (ref 5.0–8.0)

## 2019-03-20 ENCOUNTER — Encounter: Payer: Self-pay | Admitting: Family Medicine

## 2019-03-20 LAB — TSH: TSH: 1.06 u[IU]/mL (ref 0.450–4.500)

## 2019-03-20 NOTE — Progress Notes (Signed)
Lindsey Rodriguez, pretty high LDL. She has no known CAD, but even with aggressive diet, doubt LDL will decrease by total of 40-50 mg. Maybe Vytorin with low dose 20/10 would be appropriate

## 2019-03-24 NOTE — Progress Notes (Signed)
Called and left message for pt

## 2019-03-26 ENCOUNTER — Other Ambulatory Visit: Payer: Self-pay | Admitting: Family Medicine

## 2019-03-26 ENCOUNTER — Encounter: Payer: Self-pay | Admitting: Hematology

## 2019-03-26 DIAGNOSIS — Z1231 Encounter for screening mammogram for malignant neoplasm of breast: Secondary | ICD-10-CM

## 2019-03-26 NOTE — Progress Notes (Signed)
I meant ONCOLOGY, I told her  To make sure that we got the results

## 2019-03-26 NOTE — Progress Notes (Signed)
Pt is going to call back to get her flu shot and she is also going to fall and see if her GYN dr will add the lipid panel to her labs in dec so she dont have to get labs in 2 different places

## 2019-04-01 ENCOUNTER — Other Ambulatory Visit: Payer: Self-pay | Admitting: *Deleted

## 2019-04-01 DIAGNOSIS — D472 Monoclonal gammopathy: Secondary | ICD-10-CM

## 2019-04-04 NOTE — Progress Notes (Signed)
NEUROLOGY FOLLOW UP OFFICE NOTE  MELODE MURRY [id number]  HISTORY OF PRESENT ILLNESS: Lindsey Rodriguez is a 53 year old right-handed female with chronic neck and back pain with neuropathy, fibromyalgia, cardiomyopathy, hypertension, plantar fasciitis, and history of kidney sones, depression and lumbosacral radiculopathy who follows up for migraine and small fiber neuropathy.  UPDATE: Aimovig was discontinued as it gradually became less effective with subsequent doses.  She was started on Emgality.  She took it once but decided not to continue due to cost and no obvious improvement.  Due to worsening neck pain, MRI of cervical spine was performed on 12/21/2018 which demonstrated multilevel degenrative changes with mild facet disease at C2-3 and C3-4, post-surgical C4-T2 fusion with chronic deformity of right hemicord at C4-5, central and leftward protrusion at C3-4 with partial regression of disc herniation (since 2016) and slightly greater anterolisthesis related to facet arthropathy with slight left-sided cord flattening and slight left C4 foraminal narrowing.  She was referred to Dr. Payton Mccallum at [facility].  She has received two nerve blocks which were effective.  She is hoping to undergo nerve ablation.  Last nerve block was about 2 weeks ago.  Headaches have been improved.  Headaches are mild to moderate.  She doesn't wake up with a headache everyday.  Only 2 or 3 days of headache over past week.  They lasted about 3 or 4 hours with Tylenol.    Of note, she continues to have ongoing shooting pain down the legs with associated numbness and sometimes weakness.  Dr. Payton Mccallum plans to investigate further.  Frequency of abortive medication:daily Current NSAIDS:ibuprofen Current analgesics:Tylenol.  Took hydrocodone once Current triptans:no Current ergotamine:no Current anti-emetic:no Current muscle relaxants:Tizanidine 4mg  one to twice a day (also for neck  stiffness) Current anti-anxiolytic:lorazepam Current sleep aide:no Current Antihypertensive medications:Lisinopril, Coreg Current Antidepressant medications:fluoxetine60mg  Current Anticonvulsant medications:none Current anti-CGRP:Emgality Current Vitamins/Herbal/Supplements:CoQ10 30mg  Current Antihistamines/Decongestants:no Other therapy:no  Depression:yes; Anxiety:yes  Last year, she reported increased numbness and tingling in the feet (worse on the right). 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 Labs from January, including B12, Sjogren's, Sed rate, ANA, and TSH were normal. B6 was elevated at 130.6. She was advised to discontinue any B6 containing foods. Punch skin biopsy was indicative of small fiber neuropathy. CT myelogram was ordered, which revealed progressed damage at L2. She underwent bilateral epidural injection at L2 which worked for two days  She has known chronic neck pain status post C4-T2 fusion with degenerative disc disease of the cervical spine  HISTORY: Onset: Childhood Location:Varies (back of head, crown, bi-temporal, retro-orbital, band-like) Quality:Retro-orbital throbbing, band-like vice, stabbing at back of head Initial Intensity:4/10 constant, 8/10 when severe; August: Tension 6-9/10; Migraine 9/10 Aura:no Prodrome:no Associated symptoms: Nausea, photophobia, phonophobia, tunnel vision. No associated vomiting or unilateral numbness or weakness. Initial Duration:Constant but severe episodes 8 hours (with sumatriptan and naproxen); August: Tension 29min to 3 hours; Migraine until goes to sleep Initial Frequency:Daily but severe episodes occur once a week; August: Tension 3 days per week; Migraine once every 2 weeks) Triggers/aggravating factors: Emotional stress, working at the computer. Relieving factors:Ice pack and laying in dark room Activity:Cannot function at least once a week.  She has chronic  neck pain status post C4-T2 fusion.  Last MRI of cervical spine from 11/26/14 showed progression of degenerative disc disease at C3-4 with left paracentral protrusion contacts and deforms the left hemicord.  Past NSAIDs:  Naproxen Past analgesics:  Tramadol, lidocaine nasal drops Past triptans:  Relpax  40mg , Treximet, Maxalt, Zomig no, Zomig NS, sumatriptan 100mg , sumatriptan NS, Onzetra Xsail Past antihypertensives:  Propranolol (caused hypotension), amlodipine Past antidepressants:  Cymbalta, nortriptyline, venlafaxine, sertraline Past antiepileptics:  Topiramate, zonisamide, Lyrica (cognitive difficulties), Depakote, gabapentin. Past anti-CGRP:  Aimovig, Emgality Other past therapy:  biofeedback  In January2017, she developed acute onset of low back pain with pain radiating down the posterior thigh, lateral lower leg and dorsum of foot to big toe on the right.There is no associated numbness or weakness.She has taken Flexeril, which is somewhat helpful, but makes her sleepy.Pain is improved but she still notes pain when she stands up. There was no preceding injury. MRI of lumbar spine from 09/26/15 showed postsurgical changes at L4-5 and L5-S1, as well as spinal stenosis at L2-3, but no at the suspected level of L5. She hand a NCV-EMG performed on 09/28/15, which showed evidence of chronic L5 radiculopathy but nothing acute. She was referred to Dr. Renie Ora of pain management, where she was prescribed baclofen and Percocet and underwent bilateral L5/S1 transforaminal lumbar epidural steroid injections. They work but she has had recurrent pain. She underwent L3-4 fusion on 06/15/16.  PAST MEDICAL HISTORY: Past Medical History:  Diagnosis Date  . Anemia   . Anxiety panic attacks   [name]  . Arthritis    knees  . Cardiomyopathy, nonischemic ([place]) 04/07/2016   EF left ventricule calculated on Lexiscan stress test at 33% with global hypokinesis.   . Chronic back pain   .  Chronic neck pain   . Depression   . Dysrhythmia    pvc's with cardiomyopathy  . Family history of adverse reaction to anesthesia    mother has problems waking up  . Fibromyalgia    and neuropathy of feet  . History of kidney stones    lithotripsy  03/2016  . Hyperlipidemia   . Hypertension   . Kidney stone    History  . Kidney stones h/o  . Melanoma ([place]) RUE   [name]  . MGUS (monoclonal gammopathy of unknown significance) 06/2015   no treatment just watching recheck blood work 12/2015  . Migraine    OTC med prn - stress related  . Plantar fasciitis right  . Renal stones 2008/2011/2017  . Seasonal allergies   . SVD (spontaneous vaginal delivery)    x 3  . Tendonitis of foot right    MEDICATIONS: Current Outpatient Medications on File Prior to Visit  Medication Sig Dispense Refill  . carvedilol (COREG) 6.25 MG tablet Take 1 tablet (6.25 mg total) by mouth 2 (two) times daily with a meal. 180 tablet 3  . FLUoxetine (PROZAC) 40 MG capsule Take 80 mg by mouth daily.    Marland Kitchen lisinopril (ZESTRIL) 20 MG tablet Take 0.5 tablets (10 mg total) by mouth every evening. 90 tablet 1  . LORazepam (ATIVAN) 0.5 MG tablet Take 1-2 tablets (0.5-1 mg total) by mouth 3 (three) times daily as needed for anxiety. Prn anxiety 30 tablet 0  . methocarbamol (ROBAXIN) 500 MG tablet Take 500 mg by mouth 3 (three) times daily.    Marland Kitchen MISC NATURAL PRODUCT OP Take 2 capsules by mouth daily. Omega-Q    . MISC NATURAL PRODUCTS PO Take 2 capsules by mouth 2 (two) times daily. Rejuvenixx    . MISC NATURAL PRODUCTS PO Take 1 capsule by mouth 2 (two) times daily. Optimal- V    . MISC NATURAL PRODUCTS PO Take 1 capsule by mouth 2 (two) times daily. Optimal-M    .  MISC NATURAL PRODUCTS PO Take 1 capsule by mouth 2 (two) times daily. MagnaCal-D    . tiZANidine (ZANAFLEX) 4 MG tablet Take 1 tablet (4 mg total) by mouth every 6 (six) hours as needed for muscle spasms. 30 tablet 5   No current facility-administered  medications on file prior to visit.     ALLERGIES: Allergies  Allergen Reactions  . Penicillins Shortness Of Breath and Rash    Has patient had a PCN reaction causing immediate rash, facial/tongue/throat swelling, SOB or lightheadedness with hypotension: no Has patient had a PCN reaction causing severe rash involving mucus membranes or skin necrosis: no Has patient had a PCN reaction that required hospitalization no Has patient had a PCN reaction occurring within the last 10 years: no If all of the above answers are "NO", then may proceed with Cephalosporin use.   . Codeine Other (See Comments)    Manic depressive  . Voltaren [Diclofenac] Nausea And Vomiting and Other (See Comments)    Pt stated "tore stomach up"  . Erythromycin Rash    FAMILY HISTORY: Family History  Problem Relation Age of Onset  . Hypertension Mother   . Depression Mother   . Migraines Mother   . Hypothyroidism Mother   . COPD Father   . Heart failure Father 61  . Healthy Brother   . Healthy Daughter   . Healthy Son   . Healthy Daughter   . Diabetes Neg Hx     SOCIAL HISTORY: Social History   Socioeconomic History  . Marital status: Married    Spouse name: Remo Lipps  . Number of children: 3  . Years of education: Not on file  . Highest education level: Not on file  Occupational History  . Not on file  Social Needs  . Financial resource strain: Not on file  . Food insecurity    Worry: Not on file    Inability: Not on file  . Transportation needs    Medical: Not on file    Non-medical: Not on file  Tobacco Use  . Smoking status: Former Smoker    Packs/day: 0.10    Years: 3.00    Pack years: 0.30    Types: Cigarettes    Quit date: 07/04/1987    Years since quitting: 31.7  . Smokeless tobacco: Never Used  Substance and Sexual Activity  . Alcohol use: Yes    Alcohol/week: 0.0 standard drinks    Comment: rarely   . Drug use: No  . Sexual activity: Yes    Partners: Male    Birth  control/protection: Other-see comments, Surgical    Comment: husband had vasectomy; pt had hysterectomy  Lifestyle  . Physical activity    Days per week: Not on file    Minutes per session: Not on file  . Stress: Not on file  Relationships  . Social Herbalist on phone: Not on file    Gets together: Not on file    Attends religious service: Not on file    Active member of club or organization: Not on file    Attends meetings of clubs or organizations: Not on file    Relationship status: Not on file  . Intimate partner violence    Fear of current or ex partner: Not on file    Emotionally abused: Not on file    Physically abused: Not on file    Forced sexual activity: Not on file  Other Topics Concern  . Not on file  Social History Narrative   Married.  Lives with husband,    1 daughter is taking a gap year with [employer] (college advising).   Son moved out 07/2018, local, 1 granddaughter from him (who lives with the mother).   brother-in-law (with Down's syndrome) , had a stroke, in SNF   1 dog.   1 daughter in [place], [place].   Disabled due to anxiety.  Previously worked as Public relations account executive.   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    REVIEW OF SYSTEMS: Constitutional: No fevers, chills, or sweats, no generalized fatigue, change in appetite Eyes: No visual changes, double vision, eye pain Ear, nose and throat: No hearing loss, ear pain, nasal congestion, sore throat Cardiovascular: No chest pain, palpitations Respiratory:  No shortness of breath at rest or with exertion, wheezes GastrointestinaI: No nausea, vomiting, diarrhea, abdominal pain, fecal incontinence Genitourinary:  No dysuria, urinary retention or frequency Musculoskeletal:  No neck pain, back pain Integumentary: No rash, pruritus, skin lesions Neurological: as above Psychiatric: No depression, insomnia, anxiety Endocrine: No palpitations, fatigue, diaphoresis, mood swings, change in  appetite, change in weight, increased thirst Hematologic/Lymphatic:  No purpura, petechiae. Allergic/Immunologic: no itchy/runny eyes, nasal congestion, recent allergic reactions, rashes  PHYSICAL EXAM: Blood pressure 117/65, pulse 64, temperature 97.9 F (36.6 C), height 5\' 6"  (1.676 m), weight 164 lb 9.6 oz (74.7 kg), last menstrual period 12/16/2012, SpO2 98 %. General: No acute distress.  Patient appears well-groomed.   Head:  Normocephalic/atraumatic Eyes:  Fundi examined but not visualized Neck: supple, left paraspinal tenderness, full range of motion Heart:  Regular rate and rhythm Lungs:  Clear to auscultation bilaterally Back: paraspinal tenderness Neurological Exam: alert and oriented to person, place, and time. Attention span and concentration intact, recent and remote memory intact, fund of knowledge intact.  Speech fluent and not dysarthric, language intact.  CN II-XII intact. Bulk and tone normal, muscle strength 5/5 throughout.  Sensation to light touch, temperature sensation reduced in toes, vibration intact.  Deep tendon reflexes 2+ throughout, toes downgoing.  Finger to nose and heel to shin testing intact.  Gait normal, Romberg negative.  IMPRESSION: 1.  Chronic migraine without aura, without status migrainosus, not intractable 2.  Occipital neuralgia/cervicogenic headache 3.  Degenerative disc disease of cervical spine  PLAN: Upper cervical nerve root ablation.  If ineffective, will consider other options.  Otherwise, follow up in 3 to 4 months.  15 minutes spent face to face with patient, over 50% spent discussing management.    Lindsey Clines, DO  CC: Rita Ohara, MD

## 2019-04-07 ENCOUNTER — Ambulatory Visit: Payer: Medicare Other | Admitting: Neurology

## 2019-04-07 ENCOUNTER — Encounter: Payer: Self-pay | Admitting: Neurology

## 2019-04-07 ENCOUNTER — Other Ambulatory Visit (INDEPENDENT_AMBULATORY_CARE_PROVIDER_SITE_OTHER): Payer: Medicare Other

## 2019-04-07 ENCOUNTER — Other Ambulatory Visit: Payer: Self-pay

## 2019-04-07 VITALS — BP 117/65 | HR 64 | Temp 97.9°F | Ht 66.0 in | Wt 164.6 lb

## 2019-04-07 DIAGNOSIS — Z23 Encounter for immunization: Secondary | ICD-10-CM

## 2019-04-07 DIAGNOSIS — G43009 Migraine without aura, not intractable, without status migrainosus: Secondary | ICD-10-CM | POA: Diagnosis not present

## 2019-04-07 DIAGNOSIS — G6289 Other specified polyneuropathies: Secondary | ICD-10-CM

## 2019-04-07 DIAGNOSIS — M5417 Radiculopathy, lumbosacral region: Secondary | ICD-10-CM

## 2019-04-07 DIAGNOSIS — R519 Headache, unspecified: Secondary | ICD-10-CM | POA: Diagnosis not present

## 2019-04-07 DIAGNOSIS — M5030 Other cervical disc degeneration, unspecified cervical region: Secondary | ICD-10-CM

## 2019-04-07 DIAGNOSIS — G4486 Cervicogenic headache: Secondary | ICD-10-CM

## 2019-04-07 NOTE — Patient Instructions (Signed)
We will see how you respond to the ablation.  If ineffective, we can discuss other options Follow up in 3 to 4 months.

## 2019-05-19 ENCOUNTER — Ambulatory Visit: Payer: Medicare Other | Admitting: Family Medicine

## 2019-05-19 ENCOUNTER — Ambulatory Visit
Admission: RE | Admit: 2019-05-19 | Discharge: 2019-05-19 | Disposition: A | Discharge status: Home / Self Care | Payer: Medicare Other | Source: Ambulatory Visit | Attending: Family Medicine | Admitting: Family Medicine

## 2019-05-19 ENCOUNTER — Encounter: Payer: Self-pay | Admitting: Family Medicine

## 2019-05-19 ENCOUNTER — Other Ambulatory Visit: Payer: Self-pay

## 2019-05-19 VITALS — Temp 95.1°F | Wt 161.0 lb

## 2019-05-19 DIAGNOSIS — R197 Diarrhea, unspecified: Secondary | ICD-10-CM | POA: Diagnosis not present

## 2019-05-19 DIAGNOSIS — K5900 Constipation, unspecified: Secondary | ICD-10-CM

## 2019-05-19 DIAGNOSIS — R1084 Generalized abdominal pain: Secondary | ICD-10-CM

## 2019-05-19 NOTE — Progress Notes (Signed)
Start time: 9:02 End time: 9:34  Virtual Visit via Telephone Note  I connected with Lindsey Rodriguez on 05/19/19 at  9:30 AM EST by telephone and verified that I am speaking with the correct person using two identifiers. She did not have access to video, required use of landline  Location: Patient: home, in room alone Provider: office   I discussed the limitations, risks, security and privacy concerns of performing an evaluation and management service by telephone and the availability of in person appointments. I also discussed with the patient that there may be a patient responsible charge related to this service. The patient expressed understanding and agreed to proceed.   History of Present Illness: Chief Complaint  Patient presents with  . abdominal pain    lower abdominal pain but upper is very tender and some back pain since wednesday, pretty constant, some watery diarrhea,    She started with abdominal cramping last Wednesday (felt more like "uterine cramps", though doesn't have a uterus); cramps were worse by Friday.  Felt like she was "carrying a bowling ball between her legs", in perineal area, there a huge weight/pressure.  Denies any hemorrhoids.  She had been "extremely" constipated.  Then she started having gas buildup.  She took Gas-X, pushed fluids.  Her last "semi-regular" bowel movement was Friday.  She states discomfort eased off for about an hour or so after her bowel movement, but then the pressure build back up.  Pressure build up, then has a "gas explosion", with some watery diarrhea (light in color, unformed), mostly gas.    Stayed in bed all day Saturday.  Tried to function more normally yesterday, but didn't eat much.  Drank a lot of water.  Didn't have as much pressure/gas build-up yesterday.  Last night she only slept 1.5-2 hours before pressure built up and she had to go to the bathroom.  Went 5x last night, and 3x so far this morning.  Very gassy, thin,  light-colored and different/worse odor.  Denies any blood or mucus in the stool. Still has a lot of perineal pressure.  She has pain behind her umbilicus, wraps around to her spine, and down to her perineum.  (" string between belly button and anus"), sometimes is sharp.  Hurts to press on her stomach--epigastric, suprapubic and RLQ, some tenderness also at LLQ. Only mild tenderness at upper quadrants.  She tried an enema yesterday morning (Fleet's), and didn't get much of a response (cramping, small amount of soft stool). Didn't get much relief.    She has broken out in sweats, followed by chills. She tried to check her temperature, couldn't see it well enough.  This morning it was low (see below), denies having had any cold water to drink prior to checking.  She states that her face feels hot. Temp was 98.1 on Thursday (when she visited her brother-in-law).  She has had some nausea, no vomiting.  Denies any change in diet.  She has been exercising at the gym. Denies any change in medications or supplements.    Headaches are chronic, unchanged.  PMH, PSH, SH reviewed  Outpatient Encounter Medications as of 05/19/2019  Medication Sig Note  . carvedilol (COREG) 6.25 MG tablet Take 1 tablet (6.25 mg total) by mouth 2 (two) times daily with a meal.   . FLUoxetine (PROZAC) 40 MG capsule Take 80 mg by mouth daily.   Marland Kitchen lisinopril (ZESTRIL) 20 MG tablet Take 0.5 tablets (10 mg total) by mouth every evening.   Marland Kitchen  LORazepam (ATIVAN) 0.5 MG tablet Take 1-2 tablets (0.5-1 mg total) by mouth 3 (three) times daily as needed for anxiety. Prn anxiety 03/19/2019: Uses about 3x/week, sometimes 2-3x/day, for anxiety (per [name])  . MISC NATURAL PRODUCT OP Take 2 capsules by mouth daily. Omega-Q   . MISC NATURAL PRODUCTS PO Take 2 capsules by mouth 2 (two) times daily. Rejuvenixx   . MISC NATURAL PRODUCTS PO Take 1 capsule by mouth 2 (two) times daily. Optimal- V   . MISC NATURAL PRODUCTS PO Take 1 capsule by  mouth 2 (two) times daily. Optimal-M   . MISC NATURAL PRODUCTS PO Take 1 capsule by mouth 2 (two) times daily. MagnaCal-D 06/20/2017: 2 capsules twice daily  . tiZANidine (ZANAFLEX) 4 MG tablet Take 1 tablet (4 mg total) by mouth every 6 (six) hours as needed for muscle spasms. 03/19/2019: Prn, about 2x/week   No facility-administered encounter medications on file as of 05/19/2019.    Allergies  Allergen Reactions  . Penicillins Shortness Of Breath and Rash    Has patient had a PCN reaction causing immediate rash, facial/tongue/throat swelling, SOB or lightheadedness with hypotension: no Has patient had a PCN reaction causing severe rash involving mucus membranes or skin necrosis: no Has patient had a PCN reaction that required hospitalization no Has patient had a PCN reaction occurring within the last 10 years: no If all of the above answers are "NO", then may proceed with Cephalosporin use.   . Codeine Other (See Comments)    Manic depressive  . Voltaren [Diclofenac] Nausea And Vomiting and Other (See Comments)    Pt stated "tore stomach up"  . Erythromycin Rash   ROS: Has seasonal allergies with some runny nose when outside.  No cough, shortness of breath, chest pain, rashes, body aches, loss of taste or smell.  Some soreness of working out. Denies any urinary complaints (slight urgency, typical for her). Chronic headaches, unchanged.   See HPI   Observations/Objective:  Temp (!) 95.1 F (35.1 C)   Wt 161 lb (73 kg)   LMP 12/16/2012   BMI 25.99 kg/m   Patient is alert, oriented.  Sounds comfortable, speaking easily in full sentences (very descriptively) Per patient, she is tender to touch in the following areas: epigastric, suprapubic and RLQ, some tenderness also at LLQ. Only mild tenderness at upper quadrants. Exam is limited due to telephone nature of the visit.   Assessment and Plan:  Generalized abdominal pain - ddx reviewed with pt.  Given diffuse pain, cramping, and  constipation, do not suspect diverticulitis. Check AAS and treat accordingly. simethicone prn - Plan: DG Abd Acute W/Chest  Constipation, unspecified constipation type - Plan: DG Abd Acute W/Chest  Diarrhea, unspecified type - poss obstipation,with small passage of stool.  KUB should show stool burden and let us know if this is the case, vs other cause for diarrhea (ie viral)  We discussed that if there is a significant stool burden (a lot of stool seen in the bowel on x-ray), that we should be more aggressive in treating the constipation.  Can try milk of magnesia, or for a more gentle/longer treatment, Miralax 1 capful twice daily today, then once daily until results and symptoms resolved.  The x-ray will also show whether or not there is any bowel obstruction (unlikely, but will rule out a partial obstruction). Continue to monitor your temperature for fever. Continue to drink plenty of fluids. Avoid dairy (due to having diarrhea, you may not handle the lactose as  well, which would cause more bloating and gas). We will be in touch with results. We did discuss that fever, chills, and these bowel changes could also potentially be a presentation for COVID, and consider going through the drive-through testing at [facility] site for a COVID test.  The current hours for that site is 10 am to 3 pm.  Please let us know of change in symptoms.   Follow Up Instructions:    I discussed the assessment and treatment plan with the patient. The patient was provided an opportunity to ask questions and all were answered. The patient agreed with the plan and demonstrated an understanding of the instructions.   The patient was advised to call back or seek an in-person evaluation if the symptoms worsen or if the condition fails to improve as anticipated.  I provided 32 minutes of non-face-to-face time during this encounter.   [name], MD

## 2019-05-19 NOTE — Patient Instructions (Signed)
ant stool burden (a lot of stool seen in the bowel on x-ray), that we should be more aggressive in treating the constipation.  Can try milk of magnesia, or for a more gentle/longer treatment, Miralax 1 capful twice daily today, then once daily until results and symptoms resolved.  The x-ray will also show whether or not there is any bowel obstruction (unlikely, but will rule out a partial obstruction). Continue to monitor your temperature for fever. Continue to drink plenty of fluids. Avoid dairy (due to having diarrhea, you may not handle the lactose as well, which would cause more bloating and gas). We will be in touch with results. We did discuss that fever, chills, and these bowel changes could also potentially be a presentation for COVID, and consider going through the drive-through testing at [facility] site for a COVID test.  The current hours for that site is 10 am to 3 pm.  Please let us know of change in symptoms.   Abdominal Pain, Adult Abdominal pain can be caused by many things. Often, abdominal pain is not serious and it gets better with no treatment or by being treated at home. However, sometimes abdominal pain is serious. Your health care provider will do a medical history and a physical exam to try to determine the cause of your abdominal pain. Follow these instructions at home:  Take over-the-counter and prescription medicines only as told by your health care provider. Do not take a laxative unless told by your health care provider.  Drink enough fluid to keep your urine clear or pale yellow.  Watch your condition for any changes.  Keep all follow-up visits as told by your health care provider. This is important. Contact a health care provider if:  Your abdominal pain changes or gets worse.  You are not hungry or you lose weight without trying.  You are constipated or have diarrhea for more than 2-3 days.  You have pain when you urinate or have a bowel movement.   Your abdominal pain wakes you up at night.  Your pain gets worse with meals, after eating, or with certain foods.  You are throwing up and cannot keep anything down.  You have a fever. Get help right away if:  Your pain does not go away as soon as your health care provider told you to expect.  You cannot stop throwing up.  Your pain is only in areas of the abdomen, such as the right side or the left lower portion of the abdomen.  You have bloody or black stools, or stools that look like tar.  You have severe pain, cramping, or bloating in your abdomen.  You have signs of dehydration, such as: ? Dark urine, very little urine, or no urine. ? Cracked lips. ? Dry mouth. ? Sunken eyes. ? Sleepiness. ? Weakness. This information is not intended to replace advice given to you by your health care provider. Make sure you discuss any questions you have with your health care provider. Document Released: 03/29/2005 Document Revised: 01/07/2016 Document Reviewed: 12/01/2015 Elsevier Interactive Patient Education  [employer].

## 2019-05-20 ENCOUNTER — Other Ambulatory Visit: Payer: Self-pay

## 2019-05-20 DIAGNOSIS — Z20822 Contact with and (suspected) exposure to covid-19: Secondary | ICD-10-CM

## 2019-05-22 LAB — NOVEL CORONAVIRUS, NAA: SARS-CoV-2, NAA: NOT DETECTED

## 2019-05-23 ENCOUNTER — Ambulatory Visit (INDEPENDENT_AMBULATORY_CARE_PROVIDER_SITE_OTHER): Payer: Medicare Other | Admitting: Cardiology

## 2019-05-23 ENCOUNTER — Other Ambulatory Visit: Payer: Self-pay

## 2019-05-23 ENCOUNTER — Encounter: Payer: Self-pay | Admitting: Cardiology

## 2019-05-23 VITALS — BP 131/68 | HR 60 | Temp 98.0°F | Ht 66.0 in | Wt 164.7 lb

## 2019-05-23 DIAGNOSIS — E7800 Pure hypercholesterolemia, unspecified: Secondary | ICD-10-CM

## 2019-05-23 DIAGNOSIS — I428 Other cardiomyopathies: Secondary | ICD-10-CM | POA: Diagnosis not present

## 2019-05-23 DIAGNOSIS — I447 Left bundle-branch block, unspecified: Secondary | ICD-10-CM

## 2019-05-23 DIAGNOSIS — I493 Ventricular premature depolarization: Secondary | ICD-10-CM

## 2019-05-23 DIAGNOSIS — I10 Essential (primary) hypertension: Secondary | ICD-10-CM | POA: Diagnosis not present

## 2019-05-23 MED ORDER — ROSUVASTATIN CALCIUM 10 MG PO TABS: 10.0000 mg | ORAL_TABLET | Freq: Every day | ORAL | 2 refills | Status: DC

## 2019-05-23 MED ORDER — EZETIMIBE 10 MG PO TABS: 10.0000 mg | ORAL_TABLET | Freq: Every day | ORAL | 2 refills | Status: DC

## 2019-05-23 NOTE — Telephone Encounter (Signed)
Please review

## 2019-05-23 NOTE — Progress Notes (Signed)
Primary Physician/Referring:  Rita Ohara, MD  Patient ID: Lindsey Rodriguez, female    DOB: 1965/09/13, 53 y.o.   MRN: [id number]  Chief Complaint  Patient presents with  . Cardiomyopathy  . Follow-up    1 year  . Hyperlipidemia  . Hypertension   HPI:    Lindsey Rodriguez  is a 53 y.o.  Caucasian female, in September 2017 while going through lithotripsy, found to have frequent PVCs and on office visit was found to have new onset left bundle branch block and an echo revealing EF 30%. Nuclear stress test was negative for ischemia, EF 33%. She is now being treated for nonischemic cardiomyopathy, ejection fraction has improved to 55% by echocardiogram in 2018. She has hypertension and hyperlipidemia.  She is presently asymptomatic, presents for annual visit.   Past Medical History:  Diagnosis Date  . Anemia   . Anxiety panic attacks   [name]  . Arthritis    knees  . Cardiomyopathy, nonischemic ([place]) 04/07/2016   EF left ventricule calculated on Lexiscan stress test at 33% with global hypokinesis.   . Chronic back pain   . Chronic neck pain   . Depression   . Dysrhythmia    pvc's with cardiomyopathy  . Family history of adverse reaction to anesthesia    mother has problems waking up  . Fibromyalgia    and neuropathy of feet  . History of kidney stones    lithotripsy  03/2016  . Hyperlipidemia   . Hypertension   . Kidney stone    History  . Kidney stones h/o  . Melanoma ([place]) RUE   [name]  . MGUS (monoclonal gammopathy of unknown significance) 06/2015   no treatment just watching recheck blood work 12/2015  . Migraine    OTC med prn - stress related  . Plantar fasciitis right  . Renal stones 2008/2011/2017  . Seasonal allergies   . SVD (spontaneous vaginal delivery)    x 3  . Tendonitis of foot right   Past Surgical History:  Procedure Laterality Date  . ABDOMINAL HYSTERECTOMY    . ABLATION ON ENDOMETRIOSIS N/A 11/24/2015   Procedure: ABLATION ON  ENDOMETRIOSIS;  Surgeon: [name], MD;  Location: [facility];  Service: Gynecology;  Laterality: N/A;  . ANTERIOR LAT LUMBAR FUSION Right 06/15/2016   Procedure: RIGHT LUMBAR THREE-FOUR ANTEROLATERAL LUMBAR INTERBODY FUSION WITH LATERAL PLATE;  Surgeon: Erline Levine, MD;  Location: [facility];  Service: Neurosurgery;  Laterality: Right;  . BACK SURGERY  Dr.Stern, last sx Summer 2011   X 4  (L3-S1)  . BACK SURGERY  06/2016  . BLADDER SUSPENSION N/A 11/24/2015   Procedure: TRANSVAGINAL TAPE (TVT) PROCEDURE;  Surgeon: [name], MD;  Location: [facility];  Service: Gynecology;  Laterality: N/A;  . COLONOSCOPY    . CYSTOCELE REPAIR  11/24/2015   Procedure: ANTERIOR REPAIR (CYSTOCELE);  Surgeon: [name], MD;  Location: [facility];  Service: Gynecology;;  . Consuela Mimes N/A 11/24/2015   Procedure: CYSTOSCOPY;  Surgeon: [name], MD;  Location: [facility];  Service: Gynecology;  Laterality: N/A;  . DILATION AND CURETTAGE OF UTERUS    . ENDOMETRIAL ABLATION  08/2000  . EYE SURGERY Bilateral    Lasik   . KIDNEY STONE SURGERY  2011  . LITHOTRIPSY  08/2017 (L), 03/2018 (R)  . MELANOMA EXCISION  05/2015   right forearm; [name]  . NECK SURGERY  [name]  Summer 2011   X 4  (C4-T1 Level)  . RHINOPLASTY    .  ROBOTIC ASSISTED BILATERAL SALPINGO OOPHERECTOMY Bilateral 11/24/2015   Procedure: ROBOTIC ASSISTED BILATERAL SALPINGO OOPHORECTOMY;  Surgeon: [name], MD;  Location: [facility];  Service: Gynecology;  Laterality: Bilateral;--OVARIES NOT REMOVED PER PATH REPORT  . ROBOTIC ASSISTED TOTAL HYSTERECTOMY WITH SALPINGECTOMY Bilateral 11/24/2015   Procedure: ROBOTIC ASSISTED TOTAL HYSTERECTOMY WITH SALPINGECTOMY;  Surgeon: [name], MD;  Location: [facility];  Service: Gynecology;  Laterality: Bilateral;  . TONSILLECTOMY    . WISDOM TOOTH EXTRACTION     Social History   Socioeconomic History  . Marital status: Married    Spouse name: Remo Lipps  . Number of children: 3  . Years of  education: Not on file  . Highest education level: Not on file  Occupational History  . Not on file  Social Needs  . Financial resource strain: Not on file  . Food insecurity    Worry: Not on file    Inability: Not on file  . Transportation needs    Medical: Not on file    Non-medical: Not on file  Tobacco Use  . Smoking status: Former Smoker    Packs/day: 0.10    Years: 3.00    Pack years: 0.30    Types: Cigarettes    Quit date: 07/04/1987    Years since quitting: 31.9  . Smokeless tobacco: Never Used  Substance and Sexual Activity  . Alcohol use: Yes    Alcohol/week: 0.0 standard drinks    Comment: rarely   . Drug use: No  . Sexual activity: Yes    Partners: Male    Birth control/protection: Other-see comments, Surgical    Comment: husband had vasectomy; pt had hysterectomy  Lifestyle  . Physical activity    Days per week: Not on file    Minutes per session: Not on file  . Stress: Not on file  Relationships  . Social Herbalist on phone: Not on file    Gets together: Not on file    Attends religious service: Not on file    Active member of club or organization: Not on file    Attends meetings of clubs or organizations: Not on file    Relationship status: Not on file  . Intimate partner violence    Fear of current or ex partner: Not on file    Emotionally abused: Not on file    Physically abused: Not on file    Forced sexual activity: Not on file  Other Topics Concern  . Not on file  Social History Narrative   Married.  Lives with husband,    1 daughter is taking a gap year with [employer] (college advising).   Son moved out 07/2018, local, 1 granddaughter from him (who lives with the mother).   brother-in-law (with Down's syndrome) , had a stroke, in SNF   1 dog.   1 daughter in [place], [place].   Disabled due to anxiety.  Previously worked as Public relations account executive.   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   Caffeine - coffee I cup in am/    ROS  Review of Systems  Constitution: Negative for chills, decreased appetite, malaise/fatigue and weight gain.  Cardiovascular: Negative for dyspnea on exertion, leg swelling and syncope.  Endocrine: Negative for cold intolerance.  Hematologic/Lymphatic: Does not bruise/bleed easily.  Musculoskeletal: Negative for joint swelling.  Gastrointestinal: Positive for abdominal pain. Negative for anorexia, change in bowel habit, hematochezia and melena.  Neurological: Negative for headaches and light-headedness.  Psychiatric/Behavioral: Positive for depression. Negative for substance abuse.  All other systems reviewed and are negative.  Objective   Vitals with BMI 05/23/2019 05/19/2019 04/07/2019  Height _0  - _1   Weight 164 lbs 11 oz 161 lbs 164 lbs 10 oz  BMI 80.3 - 21.22  Systolic 482 - 500  Diastolic 68 - 65  Pulse 60 - 64      Physical Exam  Constitutional:  She is moderately built and well-nourished in no acute distress.  HENT:  Head: Atraumatic.  Eyes: Conjunctivae are normal.  Neck: Neck supple. No JVD present. No thyromegaly present.  Cardiovascular: Normal rate, regular rhythm, normal heart sounds and intact distal pulses.  Occasional extrasystoles are present. Exam reveals no gallop.  No murmur heard. No leg edema, no JVD.  Pulmonary/Chest: Effort normal and breath sounds normal.  Abdominal: Soft. Bowel sounds are normal.  Musculoskeletal: Normal range of motion.  Neurological: She is alert.  Skin: Skin is warm and dry.  Psychiatric: She has a normal mood and affect.   Laboratory examination:   03/13/2018: LDL particle #1742. LDL 140. HDL 71, triglycerides 93, cholesterol 230. Small LDL-P 333. LDL size 21.4. LP-IR score 51. NHDL 159.  Recent Labs    06/12/18 0840 12/18/18 0802  NA 137 137  K 5.1 4.5  CL 104 104  CO2 24 24  GLUCOSE 79 88  BUN 13 20  CREATININE 0.84 0.88  CALCIUM 9.9 9.4  GFRNONAA >60 >60  GFRAA >60 >60   CrCl cannot be calculated  (Patient's most recent lab result is older than the maximum 21 days allowed.).  CMP Latest Ref Rng & Units 12/18/2018 06/12/2018 03/13/2018  Glucose 70 - 99 mg/dL 88 79 80  BUN 6 - 20 mg/dL 20 13 -  Creatinine 0.44 - 1.00 mg/dL 0.88 0.84 -  Sodium 135 - 145 mmol/L 137 137 -  Potassium 3.5 - 5.1 mmol/L 4.5 5.1 -  Chloride 98 - 111 mmol/L 104 104 -  CO2 22 - 32 mmol/L 24 24 -  Calcium 8.9 - 10.3 mg/dL 9.4 9.9 -  Total Protein 6.5 - 8.1 g/dL 7.9 8.5(H) -  Total Bilirubin 0.3 - 1.2 mg/dL 0.5 0.6 -  Alkaline Phos 38 - 126 U/L 69 84 -  AST 15 - 41 U/L 19 21 -  ALT 0 - 44 U/L 22 19 -   CBC Latest Ref Rng & Units 12/18/2018 06/12/2018 12/19/2017  WBC 4.0 - 10.5 K/uL 4.9 5.5 5.7  Hemoglobin 12.0 - 15.0 g/dL 13.3 14.0 13.3  Hematocrit 36.0 - 46.0 % 39.7 42.1 38.7  Platelets 150 - 400 K/uL 258 303 280   Lipid Panel     Component Value Date/Time   CHOL 274 (H) 03/12/2019 0831   TRIG 120 03/12/2019 0831   HDL 76 03/12/2019 0831   CHOLHDL 3.6 03/12/2019 0831   CHOLHDL 3.3 03/08/2017 0849   VLDL 21 05/03/2016 0750   LDLCALC 177 (H) 03/12/2019 0831   LDLCALC 144 (H) 03/08/2017 0849   HEMOGLOBIN A1C Lab Results  Component Value Date   HGBA1C 5.5 01/27/2013   TSH Recent Labs    03/19/19 1116  TSH 1.060   Medications and allergies   Allergies  Allergen Reactions  . Penicillins Shortness Of Breath and Rash    Has patient had a PCN reaction causing immediate rash, facial/tongue/throat swelling, SOB or lightheadedness with hypotension: no Has patient had a PCN reaction causing severe rash involving mucus membranes or skin necrosis: no Has  patient had a PCN reaction that required hospitalization no Has patient had a PCN reaction occurring within the last 10 years: no If all of the above answers are "NO", then may proceed with Cephalosporin use.   . Codeine Other (See Comments)    Manic depressive  . Voltaren [Diclofenac] Nausea And Vomiting and Other (See Comments)    Pt stated "tore  stomach up"  . Erythromycin Rash     Current Outpatient Medications  Medication Instructions  . carvedilol (COREG) 6.25 mg, Oral, 2 times daily with meals  . ezetimibe (ZETIA) 10 mg, Oral, Daily  . FLUoxetine (PROZAC) 80 mg, Oral, Daily  . lisinopril (ZESTRIL) 10 mg, Oral, Every evening  . LORazepam (ATIVAN) 0.5-1 mg, Oral, 3 times daily PRN, Prn anxiety  . MISC NATURAL PRODUCT OP 2 capsules, Oral, Daily, Omega-Q   . MISC NATURAL PRODUCTS PO 2 capsules, Oral, Daily, Rejuvenixx   . MISC NATURAL PRODUCTS PO 1 capsule, Oral, 2 times daily, Optimal- V  . MISC NATURAL PRODUCTS PO 1 capsule, Oral, 2 times daily, Optimal-M  . MISC NATURAL PRODUCTS PO 1 capsule, Oral, 2 times daily, MagnaCal-D  . rosuvastatin (CRESTOR) 10 mg, Oral, Daily  . tiZANidine (ZANAFLEX) 4 mg, Oral, Every 6 hours PRN    Radiology:  No results found. Cardiac Studies:   Lexiscan myoview stress test 04/07/2016: 1. Resting EKG demonstrates normal sinus rhythm with frequent PVCs in the pattern of ventricular bigeminy.  Low-voltage complexes.  PVCs decreased in the recovery phase of Lexiscan stress test. 2. Left ventricular cavity is noted to be mildly dilated with LV end diastolic volume of 191 mL on the rest and stress studies. Marland Kitchen SPECT images demonstrate homogeneous tracer distribution throughout the myocardium.  The left ventricular ejection fraction was calculated or visually estimated to be 33% with global hypokinesis. Findings are consistent with nonischemic cardiomyopathy.  Holter monitor 06/06/2017: Sinus rhythm.  Minimum heart rate 54 bpm, average heart rate 71 bpm, maximum heart rate 102 bpm.  Total ventricular ectopy 3.3%.  Less than 1% supraventricular ectopy  No atrial fibrillation, atrial flutter, ventricular tachycardia.  Echocardiogram 06/06/2017: Left ventricle cavity is normal in size. Mild concentric hypertrophy of the left ventricle. Normal global wall motion. Doppler evidence of grade I (impaired)  diastolic dysfunction, normal LAP. Calculated EF 55%. Significant improvement since prior echocardiogram dated 11/01/2016 Mild (Grade I) mitral regurgitation. No evidence of pulmonary hypertension.  Assessment     ICD-10-CM   1. Non-ischemic cardiomyopathy ([place])  I42.8 PCV ECHOCARDIOGRAM COMPLETE  2. Frequent PVCs  I49.3 PCV ECHOCARDIOGRAM COMPLETE  3. LBBB (left bundle branch block)  I44.7   4. Essential hypertension, benign  I10 EKG 12-Lead    CMP14+EGFR  5. Hypercholesteremia  E78.00 rosuvastatin (CRESTOR) 10 MG tablet    ezetimibe (ZETIA) 10 MG tablet    Lipid Panel With LDL/HDL Ratio    LDL cholesterol, direct    EKG 05/23/2019: Normal sinus rhythm at the rate of 72 bpm, left atrial enlargement, left bundle branch block.  PVC. No significant change from  EKG 05/22/2018, frequent PVCs not present.  Recommendations:   Meds ordered this encounter  Medications  . rosuvastatin (CRESTOR) 10 MG tablet    Sig: Take 1 tablet (10 mg total) by mouth daily.    Dispense:  30 tablet    Refill:  2  . ezetimibe (ZETIA) 10 MG tablet    Sig: Take 1 tablet (10 mg total) by mouth daily.    Dispense:  30 tablet  Refill:  2    Lindsey Rodriguez  is a 53 y.o.  Caucasian female, in September 2017 while going through lithotripsy, found to have frequent PVCs, new onset left bundle branch block and an echo revealing EF 30%. Nuclear stress test was negative for ischemia, EF 33%.   She is now being treated for nonischemic cardiomyopathy, ejection fraction has improved to 55% by echocardiogram in 2018. She has hypertension and hyperlipidemia.   I reviewed her labs, LDL has constantly been increasing significantly.  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  She is having multiple health issues including abdominal discomfort, severe anxiety, depression,  I had a long discussion with her that her lipids need to be treated.  We'll start  with Crestor 10 mg daily, and Zetia 10 mg daily as well as she states that multiple members in the family have not been able to tolerate statins.  She will start with one half tablet per week and then is included.  I'll obtain lipid profile testing along with CMP prior to her next office visit in 2 months will also repeat echocardiogram as it is been 2 years.  She continues to have frequent PVCs on auscultation and on EKG had single PVC.  Blood pressure is well controlled.  Adrian Prows, MD, [facility] 05/23/2019, 1:12 PM [facility] [place] Pager: [phone] Office: [phone] If no answer Cell [phone]

## 2019-05-25 NOTE — Progress Notes (Signed)
Chief Complaint  Patient presents with  . Follow-up    pain in less abdominal and more pelvic now. Also right leg started throbbing about the same time as all of this started. Depression was worsening and she contacted Dr.Kaur Sat and she added 2mg  Abilify-she has taken 2 doses so far.    Patient presents for follow-up on abdominal pain.  She was seen virtually last week with complaints of lower abdominal cramping, perineal pressure, constipation, gas build-up followed by some watery diarrhea.  She was sent for COVID testing which was negative, and also for abdominal x-rays, which showed: IMPRESSION: Stable 9 mm left renal calculus. No acute abdominal abnormality. No acute cardiopulmonary disease.  History obtained at visit last week: "She started with abdominal cramping last Wednesday (felt more like "uterine cramps", though doesn't have a uterus); cramps were worse by Friday.  Felt like she was "carrying a bowling ball between her legs", in perineal area, there a huge weight/pressure.  Denies any hemorrhoids.  She had been "extremely" constipated.  Then she started having gas buildup.  She took Gas-X, pushed fluids.  Her last "semi-regular" bowel movement was Friday.  She states discomfort eased off for about an hour or so after her bowel movement, but then the pressure build back up.  Pressure build up, then has a "gas explosion", with some watery diarrhea (light in color, unformed), mostly gas.   Stayed in bed all day Saturday.  Tried to function more normally yesterday, but didn't eat much.  Drank a lot of water.  Didn't have as much pressure/gas build-up yesterday.  Last night she only slept 1.5-2 hours before pressure built up and she had to go to the bathroom.  Went 5x last night, and 3x so far this morning.  Very gassy, thin, light-colored and different/worse odor.  Denies any blood or mucus in the stool. Still has a lot of perineal pressure.  She has pain behind her umbilicus, wraps around  to her spine, and down to her perineum.  (" string between belly button and anus"), sometimes is sharp.  Hurts to press on her stomach--epigastric, suprapubic and RLQ, some tenderness also at LLQ. Only mild tenderness at upper quadrants. She tried an enema yesterday morning (Fleet's), and didn't get much of a response (cramping, small amount of soft stool). Didn't get much relief.   She has broken out in sweats, followed by chills. She tried to check her temperature, couldn't see it well enough.  This morning it was low (see below), denies having had any cold water to drink prior to checking.  She states that her face feels hot. Temp was 98.1 on Thursday (when she visited her brother-in-law).  She has had some nausea, no vomiting. Denies any change in diet.  She has been exercising at the gym. Denies any change in medications or supplements."  Today she reports that her abdominal pain "settled in my pelvis", no longer has any upper stomach pain.  She continues to have pain above the pubic bone and on either side.  No longer gassy, just pressure.  Bowels are light brown, formed, soft.   Chart reviewed.  She is s/p hysterectomy by Dr. Philis Pique. Yoder listed in chart is confusing, listing salpingectomy, but also listing BSO.  Unsure if she still has ovaries.  Looked back to last note from Dr. Philis Pique, in 02/2016, which reported salpingectomies (did NOT mention oophorectomies), so suspect she still has her ovaries.  She is now reporting some pain in her right  thigh when she would lift her leg (to get into vehicle).  Pain is also in posterior back/hip.  H/o back pain, s/p surgeries, sees Spine and Scoliosis specialist.  Last injections were in August 2019, on left, L2-4 per pt.   CT myelogram from 01/2018: IMPRESSION: LUMBAR MYELOGRAM IMPRESSION: Solid L3-S1 fusion construct. 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 No dynamic instability.  CT LUMBAR MYELOGRAM  IMPRESSION: Solid arthrodesis L3-S1. Residual foraminal narrowing at L3-4 on the LEFT, with heterotopic ossification extending into the LEFT psoas, uncertain significance. 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 Severe stenosis, with LEFT greater than RIGHT L2 and L3 neural impingement.   Started on Abilify for worsening depression, only 2 doses so far, tolerating well. She saw Dr. Einar Gip last week, was to start Zetia and low dose crestor; hasn't started these yet.  Lab Results  Component Value Date   CHOL 274 (H) 03/12/2019   HDL 76 03/12/2019   LDLCALC 177 (H) 03/12/2019   TRIG 120 03/12/2019   CHOLHDL 3.6 03/12/2019   PMH, PSH, SH reviewed  Outpatient Encounter Medications as of 05/26/2019  Medication Sig Note  . ARIPiprazole (ABILIFY) 2 MG tablet Take 2 mg by mouth daily. 05/26/2019: Taking at bedtime, started 2 days ago  . carvedilol (COREG) 6.25 MG tablet Take 1 tablet (6.25 mg total) by mouth 2 (two) times daily with a meal.   . FLUoxetine (PROZAC) 40 MG capsule Take 80 mg by mouth daily.   Marland Kitchen lisinopril (ZESTRIL) 20 MG tablet Take 0.5 tablets (10 mg total) by mouth every evening.   Marland Kitchen LORazepam (ATIVAN) 0.5 MG tablet Take 1-2 tablets (0.5-1 mg total) by mouth 3 (three) times daily as needed for anxiety. Prn anxiety 03/19/2019: Uses about 3x/week, sometimes 2-3x/day, for anxiety (per [name])  . MISC NATURAL PRODUCT OP Take 2 capsules by mouth daily. Omega-Q   . MISC NATURAL PRODUCTS PO Take 2 capsules by mouth daily. Rejuvenixx    . MISC NATURAL PRODUCTS PO Take 1 capsule by mouth 2 (two) times daily. Optimal- V   . MISC NATURAL PRODUCTS PO Take 1 capsule by mouth 2 (two) times daily. Optimal-M   . MISC NATURAL PRODUCTS PO Take 1 capsule by mouth 2 (two) times daily. MagnaCal-D 06/20/2017: 2 capsules twice daily  . tiZANidine (ZANAFLEX) 4 MG tablet Take 1 tablet (4 mg total) by mouth every 6  (six) hours as needed for muscle spasms. 03/19/2019: Prn, about 2x/week  . ezetimibe (ZETIA) 10 MG tablet Take 1 tablet (10 mg total) by mouth daily. (Patient not taking: Reported on 05/26/2019) 05/26/2019: Just prescribed last week, hasn't started it yet  . rosuvastatin (CRESTOR) 10 MG tablet Take 1 tablet (10 mg total) by mouth daily. (Patient not taking: Reported on 05/26/2019) 05/26/2019: Just prescribed last week, hasn't started yet   No facility-administered encounter medications on file as of 05/26/2019.    Allergies  Allergen Reactions  . Penicillins Shortness Of Breath and Rash    Has patient had a PCN reaction causing immediate rash, facial/tongue/throat swelling, SOB or lightheadedness with hypotension: no Has patient had a PCN reaction causing severe rash involving mucus membranes or skin necrosis: no Has patient had a PCN reaction that required hospitalization no Has patient had a PCN reaction occurring within the last 10 years: no If all of the above answers are "NO", then may proceed with Cephalosporin use.   . Codeine Other (See Comments)  Manic depressive  . Voltaren [Diclofenac] Nausea And Vomiting and Other (See Comments)    Pt stated "tore stomach up"  . Erythromycin Rash   ROS: no fever, chills, dizziness, chest pain, shortness of breath.  Abdominal pain per HPI.  Denies urinary complaints.  Occasionally she might see a "white bubble cluster", maybe mucus. Denies vaginal discharge, bleeding/spotting. Bowels are back to normal. Pain in right thigh, low back   PHYSICAL EXAM:  BP 120/70   Pulse 80   Temp (!) 96.2 F (35.7 C) (Other (Comment))   Ht 5\' 6"  (1.676 m)   Wt 165 lb 3.2 oz (74.9 kg)   LMP 12/16/2012   BMI 26.66 kg/m    Wt Readings from Last 3 Encounters:  05/23/19 164 lb 11.2 oz (74.7 kg)  05/19/19 161 lb (73 kg)  04/07/19 164 lb 9.6 oz (74.7 kg)   Pleasant female, in good spirits, in no distress. HEENT: conjunctiva and sclera are clear,  EOMI Neck: no lymphadenopathy, thyromegaly or mass WHSS.  c-spine nontender Back: Tender at thoracic spine, above bra (T 8-9?) Tender bilateral lumbar paraspinous muscles and L SI joint nontender on lumbar spine. WHSS No CVA tenderness Abdomen: soft, normal bowel sounds.  No epigastric tenderness. Tender suprapubic and across lower abdomen nontender at McBurney's point. No rebound, guarding or mass. Pelvic exam--surgically absent uterus.  Tender at R adnexa, causef pain to shoot into back/thigh. No masses. No guarding. Rectal exam: normal tone, no masses, heme negative soft stool Neuro: alert and oriented.  Normal gait.  DTR's symmetric normal gait. Speech is somewhat slow and deliberate, not slurred. full range of affect.  Urine dip: normal   ASSESSMENT/PLAN:  Pelvic pain in female - tender at R adnexa and diffusely in lower abdomen.  s/p hysterectomy, may have some scarring. r/o cyst, mass. - Plan: US Pelvic Complete With Transvaginal, POCT Urinalysis DIP (Proadvantage Device)  Lumbar radiculopathy - R sided, prob L3.  f/u with spine doctor   We are going to send you for an ultrasound to further evaluate your pelvic pain.  This should evaluate your ovaries and look for cysts or other problems.    Your thigh pain may be related to your back. Follow up with your spine doctor for treatment of the thigh if it persists/worsens.  In the differential diagnosis for your lower pelvic pain, if not related to scar tissue or ovaries, could potentially be related to thoracic spine issues.  There was no comment of any problem on the recent plain film

## 2019-05-26 ENCOUNTER — Ambulatory Visit: Payer: Medicare Other | Admitting: Family Medicine

## 2019-05-26 ENCOUNTER — Other Ambulatory Visit: Payer: Self-pay

## 2019-05-26 ENCOUNTER — Encounter: Payer: Self-pay | Admitting: Family Medicine

## 2019-05-26 VITALS — BP 120/70 | HR 80 | Temp 96.2°F | Ht 66.0 in | Wt 165.2 lb

## 2019-05-26 DIAGNOSIS — M5416 Radiculopathy, lumbar region: Secondary | ICD-10-CM

## 2019-05-26 DIAGNOSIS — R102 Pelvic and perineal pain: Secondary | ICD-10-CM

## 2019-05-26 LAB — POCT URINALYSIS DIP (PROADVANTAGE DEVICE)
Bilirubin, UA: NEGATIVE
Blood, UA: NEGATIVE
Glucose, UA: NEGATIVE mg/dL
Ketones, POC UA: NEGATIVE mg/dL
Leukocytes, UA: NEGATIVE
Nitrite, UA: NEGATIVE
Protein Ur, POC: NEGATIVE mg/dL
Specific Gravity, Urine: 1.01
Urobilinogen, Ur: NEGATIVE
pH, UA: 7 (ref 5.0–8.0)

## 2019-05-26 NOTE — Patient Instructions (Signed)
  We are going to send you for an ultrasound to further evaluate your pelvic pain.  This should evaluate your ovaries and look for cysts or other problems.    Your thigh pain may be related to your back. Follow up with your spine doctor for treatment of the thigh if it persists/worsens.  In the differential diagnosis for your lower pelvic pain, if not related to scar tissue or ovaries, could potentially be related to thoracic spine issues.  There was no comment of any problem on the recent plain film

## 2019-06-03 HISTORY — PX: OTHER SURGICAL HISTORY: SHX169

## 2019-06-05 ENCOUNTER — Ambulatory Visit
Admission: RE | Admit: 2019-06-05 | Discharge: 2019-06-05 | Disposition: A | Discharge status: Home / Self Care | Payer: Medicare Other | Source: Ambulatory Visit | Attending: Family Medicine | Admitting: Family Medicine

## 2019-06-05 DIAGNOSIS — R102 Pelvic and perineal pain: Secondary | ICD-10-CM

## 2019-06-06 NOTE — Progress Notes (Signed)
(  Key: [id number]) Emgality 120MG /ML auto-injectors (migraine)  Wait for Determination Please wait for [employer] 2017 NCPDP to return a determination.

## 2019-06-09 ENCOUNTER — Ambulatory Visit: Payer: Medicare Other | Admitting: Rheumatology

## 2019-06-09 NOTE — Progress Notes (Signed)
Status: approve Patient ID# [id number]  Approve through 07/02/2020  [phone]

## 2019-06-13 ENCOUNTER — Other Ambulatory Visit: Payer: Self-pay

## 2019-06-13 ENCOUNTER — Ambulatory Visit
Admission: RE | Admit: 2019-06-13 | Discharge: 2019-06-13 | Disposition: A | Discharge status: Home / Self Care | Payer: Medicare Other | Source: Ambulatory Visit | Attending: Family Medicine | Admitting: Family Medicine

## 2019-06-13 ENCOUNTER — Ambulatory Visit (INDEPENDENT_AMBULATORY_CARE_PROVIDER_SITE_OTHER): Payer: Medicare Other

## 2019-06-13 DIAGNOSIS — I428 Other cardiomyopathies: Secondary | ICD-10-CM | POA: Diagnosis not present

## 2019-06-13 DIAGNOSIS — Z9189 Other specified personal risk factors, not elsewhere classified: Secondary | ICD-10-CM

## 2019-06-13 DIAGNOSIS — Z780 Asymptomatic menopausal state: Secondary | ICD-10-CM

## 2019-06-13 DIAGNOSIS — I493 Ventricular premature depolarization: Secondary | ICD-10-CM | POA: Diagnosis not present

## 2019-06-13 DIAGNOSIS — Z1231 Encounter for screening mammogram for malignant neoplasm of breast: Secondary | ICD-10-CM

## 2019-06-13 DIAGNOSIS — D472 Monoclonal gammopathy: Secondary | ICD-10-CM

## 2019-06-16 ENCOUNTER — Other Ambulatory Visit: Payer: Self-pay | Admitting: Family Medicine

## 2019-06-16 ENCOUNTER — Telehealth: Payer: Self-pay | Admitting: Hematology

## 2019-06-16 DIAGNOSIS — R928 Other abnormal and inconclusive findings on diagnostic imaging of breast: Secondary | ICD-10-CM

## 2019-06-16 NOTE — Telephone Encounter (Signed)
[name] PAL 12/28 moved f/u to 1/5. Confirned with patient.

## 2019-06-20 ENCOUNTER — Ambulatory Visit
Admission: RE | Admit: 2019-06-20 | Discharge: 2019-06-20 | Disposition: A | Discharge status: Home / Self Care | Payer: Medicare Other | Source: Ambulatory Visit | Attending: Family Medicine | Admitting: Family Medicine

## 2019-06-20 ENCOUNTER — Other Ambulatory Visit: Payer: Self-pay

## 2019-06-20 ENCOUNTER — Ambulatory Visit: Payer: Medicare Other

## 2019-06-20 DIAGNOSIS — R928 Other abnormal and inconclusive findings on diagnostic imaging of breast: Secondary | ICD-10-CM

## 2019-06-23 ENCOUNTER — Inpatient Hospital Stay: Payer: Medicare Other | Attending: Hematology | Admitting: Hematology

## 2019-06-23 ENCOUNTER — Other Ambulatory Visit: Payer: Self-pay

## 2019-06-23 DIAGNOSIS — F419 Anxiety disorder, unspecified: Secondary | ICD-10-CM | POA: Insufficient documentation

## 2019-06-23 DIAGNOSIS — Z87891 Personal history of nicotine dependence: Secondary | ICD-10-CM | POA: Diagnosis not present

## 2019-06-23 DIAGNOSIS — Z79899 Other long term (current) drug therapy: Secondary | ICD-10-CM | POA: Insufficient documentation

## 2019-06-23 DIAGNOSIS — I10 Essential (primary) hypertension: Secondary | ICD-10-CM | POA: Insufficient documentation

## 2019-06-23 DIAGNOSIS — D472 Monoclonal gammopathy: Secondary | ICD-10-CM | POA: Diagnosis present

## 2019-06-23 DIAGNOSIS — E559 Vitamin D deficiency, unspecified: Secondary | ICD-10-CM

## 2019-06-23 LAB — CMP (CANCER CENTER ONLY)
ALT: 31 U/L (ref 0–44)
AST: 24 U/L (ref 15–41)
Albumin: 4.6 g/dL (ref 3.5–5.0)
Alkaline Phosphatase: 61 U/L (ref 38–126)
Anion gap: 8 (ref 5–15)
BUN: 13 mg/dL (ref 6–20)
CO2: 29 mmol/L (ref 22–32)
Calcium: 9.8 mg/dL (ref 8.9–10.3)
Chloride: 101 mmol/L (ref 98–111)
Creatinine: 0.85 mg/dL (ref 0.44–1.00)
GFR, Est AFR Am: >60 mL/min (ref >60)
GFR, Estimated: >60 mL/min (ref >60)
Glucose, Bld: 90 mg/dL (ref 70–99)
Potassium: 4.6 mmol/L (ref 3.5–5.1)
Sodium: 138 mmol/L (ref 135–145)
Total Bilirubin: 0.5 mg/dL (ref 0.3–1.2)
Total Protein: 8.1 g/dL (ref 6.5–8.1)

## 2019-06-23 LAB — CBC WITH DIFFERENTIAL/PLATELET
Abs Immature Granulocytes: 0.01 10*3/uL (ref 0.00–0.07)
Basophils Absolute: 0.1 10*3/uL (ref 0.0–0.1)
Basophils Relative: 1 %
Eosinophils Absolute: 0.1 10*3/uL (ref 0.0–0.5)
Eosinophils Relative: 2 %
HCT: 39.3 % (ref 36.0–46.0)
Hemoglobin: 13.3 g/dL (ref 12.0–15.0)
Immature Granulocytes: 0 %
Lymphocytes Relative: 27 %
Lymphs Abs: 1.5 10*3/uL (ref 0.7–4.0)
MCH: 33.3 pg (ref 26.0–34.0)
MCHC: 33.8 g/dL (ref 30.0–36.0)
MCV: 98.5 fL (ref 80.0–100.0)
Monocytes Absolute: 0.5 10*3/uL (ref 0.1–1.0)
Monocytes Relative: 8 %
Neutro Abs: 3.5 10*3/uL (ref 1.7–7.7)
Neutrophils Relative %: 62 %
Platelets: 259 10*3/uL (ref 150–400)
RBC: 3.99 MIL/uL (ref 3.87–5.11)
RDW: 12.4 % (ref 11.5–15.5)
WBC: 5.6 10*3/uL (ref 4.0–10.5)
nRBC: 0 % (ref 0.0–0.2)

## 2019-06-23 LAB — LIPID PANEL
Cholesterol: 295 mg/dL — ABNORMAL HIGH (ref 0–200)
HDL: 70 mg/dL (ref >40)
LDL Cholesterol: 197 mg/dL — ABNORMAL HIGH (ref 0–99)
Total CHOL/HDL Ratio: 4.2 RATIO
Triglycerides: 140 mg/dL (ref <150)
VLDL: 28 mg/dL (ref 0–40)

## 2019-06-23 LAB — VITAMIN D 25 HYDROXY (VIT D DEFICIENCY, FRACTURES): Vit D, 25-Hydroxy: 24.24 ng/mL — ABNORMAL LOW (ref 30–100)

## 2019-06-23 NOTE — Progress Notes (Signed)
Advise pt--cholesterol is even higher than September's check.  Needs OV to discuss results and meds

## 2019-06-25 LAB — MULTIPLE MYELOMA PANEL, SERUM
Albumin SerPl Elph-Mcnc: 4.4 g/dL (ref 2.9–4.4)
Albumin/Glob SerPl: 1.4 (ref 0.7–1.7)
Alpha 1: 0.1 g/dL (ref 0.0–0.4)
Alpha2 Glob SerPl Elph-Mcnc: 0.7 g/dL (ref 0.4–1.0)
B-Globulin SerPl Elph-Mcnc: 1 g/dL (ref 0.7–1.3)
Gamma Glob SerPl Elph-Mcnc: 1.4 g/dL (ref 0.4–1.8)
Globulin, Total: 3.2 g/dL (ref 2.2–3.9)
IgA: 65 mg/dL — ABNORMAL LOW (ref 87–352)
IgG (Immunoglobin G), Serum: 1484 mg/dL (ref 586–1602)
IgM (Immunoglobulin M), Srm: 64 mg/dL (ref 26–217)
M Protein SerPl Elph-Mcnc: 1.1 g/dL — ABNORMAL HIGH
Total Protein ELP: 7.6 g/dL (ref 6.0–8.5)

## 2019-06-30 ENCOUNTER — Ambulatory Visit: Payer: Medicare Other | Admitting: Hematology

## 2019-07-01 ENCOUNTER — Ambulatory Visit: Payer: Medicare Other | Admitting: Rheumatology

## 2019-07-01 NOTE — Progress Notes (Deleted)
Start time: End time:  Virtual Visit via Video Note  I connected with KARISHA CHAUDHARI on 07/02/2019 by a video enabled telemedicine application and verified that I am speaking with the correct person using two identifiers.  Location: Patient: *** Provider: office   I discussed the limitations of evaluation and management by telemedicine and the availability of in person appointments. The patient expressed understanding and agreed to proceed.  History of Present Illness:  Patient presents to discuss her cholesterol results.  At her physical in September she reported eating red meat 3-4 times/week.  Uses ground [place] a lot. Some burgers. +butter. Uses coconut oil. 3-4 eggs/week. Lipids at that time (03/2019).  CHOL 274 (H) 03/12/2019   HDL 76 03/12/2019   LDLCALC 144 (H) 03/08/2017   TRIG 120 03/12/2019   CHOLHDL 3.6 03/12/2019    LDL 177 on 03/12/2019  After discussing diet and choosing a dietary trial prior to starting medications, she had lipids repeated: Lab Results  Component Value Date   CHOL 295 (H) 06/23/2019   HDL 70 06/23/2019   LDLCALC 197 (H) 06/23/2019   TRIG 140 06/23/2019   CHOLHDL 4.2 06/23/2019    (She previously tookfenofibratefor high TG, but lipids remained good after a trial off medication. As stated above, LDL has been increasing since then.)  [name], but Lotrel was stopped after her weight loss. She is now under the care of Dr. Nadyne Coombes for cardiomyopathy, and is on lisinopril and carvedilol. She has been doing well, without any e/o CHF, EF had improved to 55% on last check in 2018.  Last visit was in 06/2018 with Dr. Einar Gip.  He mentioned her high cholesterol, and that pt declined statin, wanted to work on diet. She has f/u scheduled with Dr. Einar Gip next month.    Observations/Objective:   Assessment and Plan:   Send labs to Dr. Einar Gip  Follow Up Instructions:    I discussed the assessment and treatment plan with the  patient. The patient was provided an opportunity to ask questions and all were answered. The patient agreed with the plan and demonstrated an understanding of the instructions.   The patient was advised to call back or seek an in-person evaluation if the symptoms worsen or if the condition fails to improve as anticipated.  I provided *** minutes of non-face-to-face time during this encounter.   [name], MD

## 2019-07-02 ENCOUNTER — Telehealth: Payer: Self-pay | Admitting: Internal Medicine

## 2019-07-02 ENCOUNTER — Ambulatory Visit: Payer: Self-pay | Admitting: Family Medicine

## 2019-07-02 NOTE — Telephone Encounter (Signed)
Pt states that she cancelled the appt with you as she wants to see Dr. Irene Limbo first on the 5th before coming to see you. She wanted to talk about her cholesterol and vitamin d with him as she thinks something else is going on with her. She will call back to schedule after her appt

## 2019-07-06 ENCOUNTER — Encounter: Payer: Self-pay | Admitting: Hematology

## 2019-07-07 ENCOUNTER — Telehealth: Payer: Self-pay | Admitting: *Deleted

## 2019-07-07 NOTE — Telephone Encounter (Signed)
Contacted patient in response to Mychart question to inform that spouse can attend appt with Dr. Irene Limbo only - visitors are not allowed in lab area or infusion area of [facility] at this time.  Patient verbalized understanding.

## 2019-07-07 NOTE — Progress Notes (Signed)
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 Skeletal survey negative. Bone marrow deferred as per patient choice.  Current Treatment: observation   INTERVAL HISTORY:  Mrs. Stream is here for her scheduled 6 month follow-up for IgG lambda MGUS. The patient's last visit with [place] was on 12/25/2018. The pt reports that she is doing well overall.  The pt reports that she has been having some new pain with no known cause. She does have small fiber neuropathy but she does not attribute the pain in her lower right arm, left leg, right back pain to this condition. The pain is localized but is not accompanied by a bruise. It does not appear to be worsening or improving. She has been suspicious of blood in her urine a few weeks ago but did not get it tested. Pt has had several kidney stones in the past and currently has some in her left kidney. Pt does see a Urologist regularly.   Pt's PCP and Cardiologist has suggested that pt begin Crestor and Zetia to better control her HLD. Pt has been taking Magnical-D 4000IU per day for nearly 3 years. She has been using a gym to stay active. Pt has been classified as osteopenic per a 06/13/19 bone density survey.   Lab results (06/23/19) of CBC w/diff and CMP is as follows: all values are WNL. 06/23/2019 MMP is as follows: all values are WNL except for IgA at 65, M Protein at 1.1 06/23/2019 Vitamin D 25-Hydroxy at 24.24  On review of systems, pt reports lower right arm pain, left leg pain, spinal pain and denies fevers, chills and any other symptoms.    REVIEW OF SYSTEMS:   A 10+ POINT REVIEW OF SYSTEMS WAS  OBTAINED including neurology, dermatology, psychiatry, cardiac, respiratory, lymph, extremities, GI, GU, Musculoskeletal, constitutional, breasts, reproductive, HEENT.  All pertinent positives are noted in the HPI.  All others are negative.  . Past Medical History:  Diagnosis Date  . Anemia   . Anxiety panic attacks   [name]  . Arthritis    knees  . Cardiomyopathy, nonischemic ([place]) 04/07/2016   EF left ventricule calculated on Lexiscan stress test at 33% with global hypokinesis.   . Chronic back pain   . Chronic neck pain   . Depression   . Dysrhythmia    pvc's with cardiomyopathy  . Family history of adverse reaction to anesthesia    mother has problems waking up  . Fibromyalgia    and neuropathy of feet  . History of kidney stones    lithotripsy  03/2016  . Hyperlipidemia   . Hypertension   . Kidney stone    History  . Kidney stones h/o  . Melanoma ([place]) RUE   [name]  . MGUS (monoclonal gammopathy of unknown significance) 06/2015   no treatment just watching recheck blood work 12/2015  . Migraine    OTC med prn - stress related  . Plantar fasciitis right  . Renal stones 2008/2011/2017  . Seasonal allergies   . SVD (spontaneous vaginal delivery)    x 3  . Tendonitis of foot right    Past Surgical History:  Procedure Laterality Date  . ABDOMINAL HYSTERECTOMY    .  ABLATION ON ENDOMETRIOSIS N/A 11/24/2015   Procedure: ABLATION ON ENDOMETRIOSIS;  Surgeon: [name], MD;  Location: [facility];  Service: Gynecology;  Laterality: N/A;  . ANTERIOR LAT LUMBAR FUSION Right 06/15/2016   Procedure: RIGHT LUMBAR THREE-FOUR ANTEROLATERAL LUMBAR INTERBODY FUSION WITH LATERAL PLATE;  Surgeon: Erline Levine, MD;  Location: [facility];  Service: Neurosurgery;  Laterality: Right;  . BACK SURGERY  Dr.Stern, last sx Summer 2011   X 4  (L3-S1)  . BACK SURGERY  06/2016  . BLADDER SUSPENSION N/A 11/24/2015   Procedure: TRANSVAGINAL TAPE (TVT) PROCEDURE;  Surgeon: [name], MD;   Location: [facility];  Service: Gynecology;  Laterality: N/A;  . COLONOSCOPY    . CYSTOCELE REPAIR  11/24/2015   Procedure: ANTERIOR REPAIR (CYSTOCELE);  Surgeon: [name], MD;  Location: [facility];  Service: Gynecology;;  . Consuela Mimes N/A 11/24/2015   Procedure: CYSTOSCOPY;  Surgeon: [name], MD;  Location: [facility];  Service: Gynecology;  Laterality: N/A;  . DILATION AND CURETTAGE OF UTERUS    . ENDOMETRIAL ABLATION  08/2000  . EYE SURGERY Bilateral    Lasik   . KIDNEY STONE SURGERY  2011  . LITHOTRIPSY  08/2017 (L), 03/2018 (R)  . MELANOMA EXCISION  05/2015   right forearm; [name]  . NECK SURGERY  [name]  Summer 2011   X 4  (C4-T1 Level)  . RHINOPLASTY    . ROBOTIC ASSISTED BILATERAL SALPINGO OOPHERECTOMY Bilateral 11/24/2015   Procedure: ROBOTIC ASSISTED BILATERAL SALPINGO OOPHORECTOMY;  Surgeon: [name], MD;  Location: [facility];  Service: Gynecology;  Laterality: Bilateral;--OVARIES NOT REMOVED PER PATH REPORT  . ROBOTIC ASSISTED TOTAL HYSTERECTOMY WITH SALPINGECTOMY Bilateral 11/24/2015   Procedure: ROBOTIC ASSISTED TOTAL HYSTERECTOMY WITH SALPINGECTOMY;  Surgeon: [name], MD;  Location: [facility];  Service: Gynecology;  Laterality: Bilateral;  . TONSILLECTOMY    . WISDOM TOOTH EXTRACTION      Social History   Tobacco Use  . Smoking status: Former Smoker    Packs/day: 0.10    Years: 3.00    Pack years: 0.30    Types: Cigarettes    Quit date: 07/04/1987    Years since quitting: 32.0  . Smokeless tobacco: Never Used  Substance Use Topics  . Alcohol use: Yes    Alcohol/week: 0.0 standard drinks    Comment: rarely   . Drug use: No   ALLERGIES:  is allergic to penicillins; codeine; voltaren [diclofenac]; and erythromycin.  MEDICATIONS:  Current Outpatient Medications  Medication Sig Dispense Refill  . ARIPiprazole (ABILIFY) 2 MG tablet Take 2 mg by mouth daily.    . carvedilol (COREG) 6.25 MG tablet Take 1 tablet (6.25 mg total) by mouth 2 (two) times  daily with a meal. 180 tablet 3  . FLUoxetine (PROZAC) 40 MG capsule Take 80 mg by mouth daily.    Marland Kitchen lisinopril (ZESTRIL) 20 MG tablet Take 0.5 tablets (10 mg total) by mouth every evening. 90 tablet 1  . LORazepam (ATIVAN) 0.5 MG tablet Take 1-2 tablets (0.5-1 mg total) by mouth 3 (three) times daily as needed for anxiety. Prn anxiety 30 tablet 0  . MISC NATURAL PRODUCT OP Take 2 capsules by mouth daily. Omega-Q    . MISC NATURAL PRODUCTS PO Take 2 capsules by mouth daily. Rejuvenixx     . MISC NATURAL PRODUCTS PO Take 1 capsule by mouth 2 (two) times daily. Optimal- V    . MISC NATURAL PRODUCTS PO Take 1 capsule by mouth 2 (two) times daily. Optimal-M    .  MISC NATURAL PRODUCTS PO Take 1 capsule by mouth 2 (two) times daily. MagnaCal-D    . tiZANidine (ZANAFLEX) 4 MG tablet Take 1 tablet (4 mg total) by mouth every 6 (six) hours as needed for muscle spasms. 30 tablet 5  . ezetimibe (ZETIA) 10 MG tablet Take 1 tablet (10 mg total) by mouth daily. (Patient not taking: Reported on 05/26/2019) 30 tablet 2  . rosuvastatin (CRESTOR) 10 MG tablet Take 1 tablet (10 mg total) by mouth daily. (Patient not taking: Reported on 05/26/2019) 30 tablet 2   No current facility-administered medications for this visit.    PHYSICAL EXAMINATION: ECOG PERFORMANCE STATUS: 2  Vitals:   07/08/19 0935  BP: 109/71  Pulse: 78  Resp: 18  Temp: 98 F (36.7 C)  SpO2: 100%    Filed Weights   07/08/19 0935  Weight: 165 lb 4.8 oz (75 kg)   .Body mass index is 26.68 kg/m.  GENERAL:alert, in no acute distress, tenderness to palpation of her right costovertebral angle SKIN: no acute rashes, no significant lesions EYES: conjunctiva are pink and non-injected, sclera anicteric OROPHARYNX: MMM, no exudates, no oropharyngeal erythema or ulceration NECK: supple, no JVD LYMPH:  no palpable lymphadenopathy in the cervical, axillary or inguinal regions LUNGS: clear to auscultation b/l with normal respiratory  effort HEART: regular rate & rhythm ABDOMEN:  normoactive bowel sounds , non tender, not distended. No palpable hepatosplenomegaly.  Extremity: no pedal edema PSYCH: alert & oriented x 3 with fluent speech NEURO: no focal motor/sensory deficits  LABORATORY DATA:   I have reviewed the data as listed  . CBC Latest Ref Rng & Units 06/23/2019 12/18/2018 06/12/2018  WBC 4.0 - 10.5 K/uL 5.6 4.9 5.5  Hemoglobin 12.0 - 15.0 g/dL 13.3 13.3 14.0  Hematocrit 36.0 - 46.0 % 39.3 39.7 42.1  Platelets 150 - 400 K/uL 259 258 303    . CMP Latest Ref Rng & Units 06/23/2019 12/18/2018 06/12/2018  Glucose 70 - 99 mg/dL 90 88 79  BUN 6 - 20 mg/dL 13 20 13   Creatinine 0.44 - 1.00 mg/dL 0.85 0.88 0.84  Sodium 135 - 145 mmol/L 138 137 137  Potassium 3.5 - 5.1 mmol/L 4.6 4.5 5.1  Chloride 98 - 111 mmol/L 101 104 104  CO2 22 - 32 mmol/L 29 24 24   Calcium 8.9 - 10.3 mg/dL 9.8 9.4 9.9  Total Protein 6.5 - 8.1 g/dL 8.1 7.9 8.5(H)  Total Bilirubin 0.3 - 1.2 mg/dL 0.5 0.5 0.6  Alkaline Phos 38 - 126 U/L 61 69 84  AST 15 - 41 U/L 24 19 21   ALT 0 - 44 U/L 31 22 19         RADIOGRAPHIC STUDIES: I have personally reviewed the radiological images as listed and agreed with the findings in the report. DG Bone Density  Result Date: 06/13/2019 EXAM: DUAL X-RAY ABSORPTIOMETRY (DXA) FOR BONE MINERAL DENSITY IMPRESSION: Referring Physician:  EVE KNAPP Your patient completed a BMD test using Lunar IDXA DXA system ( analysis version: 16 ) manufactured by [employer]. Technologist: AW PATIENT: Name: Payzlee, Ryder Patient ID: [id number] Birth Date: July 19, 1965 Height: 66.0 in. Sex: Female Measured: 06/13/2019 Weight: 164.8 lbs. Indications: Bilateral Ovariectomy (65.51), Caucasian, Estrogen Deficient, Hysterectomy, osteoarthiritis, Postmenopausal, Prozac Fractures: None Treatments: Calcium (E943.0), Vitamin D (E933.5) ASSESSMENT: The BMD measured at Forearm Radius 33% is 0.766 g/cm2 with a T-score of -1.4. This  patient is considered OSTEOPENIC according to [facility]) criteria. The scan quality is good. Lumbar spine was  not utilized due to surgical hardware. Site Region Measured Date Measured Age YA T-score BMD Significant CHANGE Left Forearm Radius 33% 06/13/2019 53.9 -1.4 0.766 g/cm2 DualFemur Neck Left 06/13/2019 53.9 -0.3 0.996 g/cm2 DualFemur Total Mean 06/13/2019 53.9 0.2 1.037 g/cm2 World Health Organization [facility]) criteria for post-menopausal, Caucasian Women: Normal       T-score at or above -1 SD Osteopenia   T-score between -1 and -2.5 SD Osteoporosis T-score at or below -2.5 SD RECOMMENDATION: 1. All patients should optimize calcium and vitamin D intake. 2. Consider FDA approved medical therapies in postmenopausal women and men aged 77 years and older, based on the following: a. A hip or vertebral (clinical or morphometric) fracture b. T- score < or = -2.5 at the femoral neck or spine after appropriate evaluation to exclude secondary causes c. Low bone mass (T-score between -1.0 and -2.5 at the femoral neck or spine) and a 10 year probability of a hip fracture > or = 3% or a 10 year probability of a major osteoporosis-related fracture > or = 20% based on the [place]-adapted WHO algorithm d. Clinician judgment and/or patient preferences may indicate treatment for people with 10-year fracture probabilities above or below these levels FOLLOW-UP: Patients with diagnosis of osteoporosis or at high risk for fracture should have regular bone mineral density tests. For patients eligible for Medicare, routine testing is allowed once every 2 years. The testing frequency can be increased to one year for patients who have rapidly progressing disease, those who are receiving or discontinuing medical therapy to restore bone mass, or have additional risk factors. I have reviewed this report and agree with the above findings. Mark A. Thornton Papas, M.D. [facility] FRAX* 10-year Probability of Fracture Based on  femoral neck BMD: DualFemur (Left) Major Osteoporotic Fracture: 4.9% Hip Fracture:                0.1% Population:                  [place] (Caucasian) Risk Factors:                None *FRAX is a Materials engineer of the [employer] for Metabolic Bone Disease, a World Pharmacologist (WHO) [facility]. ASSESSMENT: The probability of a major osteoporotic fracture is 4.9 % within the next ten years. The probability of a hip fracture is 0.1 % within the next ten years. I have reviewed this report and agree with the above findings. Mark A. Thornton Papas, M.D. [facility] Electronically Signed   By: Lavonia Dana M.D.   On: 06/13/2019 10:40   MM DIAG BREAST TOMO UNI LEFT  Result Date: 06/20/2019 CLINICAL DATA:  Patient returns today to evaluate a possible LEFT breast mass questioned on recent screening mammogram. EXAM: DIGITAL DIAGNOSTIC UNILATERAL LEFT MAMMOGRAM WITH CAD AND TOMO COMPARISON:  Previous exam(s). ACR Breast Density Category c: The breast tissue is heterogeneously dense, which may obscure small masses. FINDINGS: On today's additional diagnostic views with 3D tomosynthesis, there is no persistent asymmetry within the posterior LEFT breast indicating superimposition of normal dense fibroglandular tissues. On today's exam, the overall fibroglandular pattern is stable compared to multiple prior studies confirming benignity. There are no new dominant masses, suspicious calcifications or secondary signs of malignancy identified. Mammographic images were processed with CAD. IMPRESSION: No evidence of malignancy. Patient may return to routine annual bilateral screening mammogram schedule. RECOMMENDATION: Screening mammogram in one year.(Code:SM-B-01Y) I have discussed the findings and recommendations with the patient.  If applicable, a reminder letter will be sent to the patient regarding the next appointment. BI-RADS CATEGORY  1: Negative. Electronically Signed   By: Franki Cabot M.D.   On: 06/20/2019 09:41   MM 3D SCREEN BREAST BILATERAL  Result Date: 06/13/2019 CLINICAL DATA:  Screening. EXAM: DIGITAL SCREENING BILATERAL MAMMOGRAM WITH TOMO AND CAD COMPARISON:  Previous exam(s). ACR Breast Density Category c: The breast tissue is heterogeneously dense, which may obscure small masses. FINDINGS: In the left breast, a possible mass warrants further evaluation. In the right breast, no findings suspicious for malignancy. Images were processed with CAD. IMPRESSION: Further evaluation is suggested for possible mass in the left breast. RECOMMENDATION: Diagnostic mammogram and possibly ultrasound of the left breast. (Code:FI-L-70M) The patient will be contacted regarding the findings, and additional imaging will be scheduled. BI-RADS CATEGORY  0: Incomplete. Need additional imaging evaluation and/or prior mammograms for comparison. Electronically Signed   By: Ammie Ferrier M.D.   On: 06/13/2019 16:46   PCV ECHOCARDIOGRAM COMPLETE  Result Date: 06/14/2019 Echocardiogram 06/13/2019: Left ventricle cavity is normal in size and thickness. Mild global hypokinesis with mildly depressed LV systolic function with visual EF 45-50%. Frequent PVC's seen. Doppler evidence of grade I (impaired) diastolic dysfunction, normal LAP. Mild to moderate mitral regurgitation. Mild tricuspid regurgitation. No evidence of pulmonary hypertension. Compared to previous study in 2018, LVEF is reduced from 55% to 45-50%. Mitral and tricuspid regurgitation more prominent.    ASSESSMENT & PLAN:   54 y.o. 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  Previous UPEP and skeletal survey negative. Patient has no evidence of anemia, renal failure on labs. No hypercalcemia. No new bone pain. SPEP shows stable M-spike at 1g/dl with no significant increase  over the last couple of follow-ups. SFLC ratio within normal limits -12/21/18 MRI Cervical spine which did not reveal any overt indication/concern for bone tumors. Continue follow up with Orthopedics and neurology.  PLAN: -Discussed pt labwork, 06/23/19; blood counts and chemistries are good  -Discussed 06/23/2019 MMP shows M Protein stable at 1.1 g/dL -Discussed 06/23/2019 Vitamin D 25-Hydroxy is low at 24.24 -Pt does not currently fit the CRAB criteria: no hypercalcemia, no renal insufficiency, no anemia, no identified bone lesions -The pt shows no clinical or lab progression of her monoclonal paraproteinemia at this time.  -No indication for further treatment at this time.  -Discussed that monoclonal paraproteinemia does present risk of osteoporosis - 06/13/19 Bone Density ordered by Dr. Tomi Bamberger shows that pt is Victor pt continue replacing Vitamin D, Vitamin C and staying active  -Recommend pt f/u with Dr. Tomi Bamberger for Osteopenia -Recommend pt f/u with Urologist for potential kidney stone -Will continue to monitor patient clinically with labs every 6 months at this time, and no overt indication to pursue a BM Bx -Will see the pt back in 6 months with labs 1 week prior  2) Systolic nonischemic CHF patient previously noted improvement in her EF from 33% to 45%. She subsequently noted her EF normalized to 55% as of 06/19/18 clinic visit.  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 Plan -Continue management per dermatology and ongoing skin surveillance. -continue sun protection.  4) Peripheral radiculopathy /neuropathy -  Is currently followed by Vertell Limber of Neurosurgery as well as Dr Tomi Likens of Neurology who have begun to work this up further. -Most recent electromyography showed evidence of  radiculopathy, mild in degree, and without evidence of sensorimotor polyneuropathy.   Cannot r/o small fiber neuropathy -  Evidence of this on epidermal nerve fiber density analysis. Plan -Mx per neurology  Continue followup with primary care physician, rheumatology, cardiology, and neurology    FOLLOW UP: RTC with Dr Irene Limbo with labs in 6 months. Plz schedule labs 1 week prior to clinic visit   The total time spent in the appt was 20 minutes and more than 50% was on counseling and direct patient cares.  All of the patient's questions were answered with apparent satisfaction. The patient knows to call the clinic with any problems, questions or concerns.    Sullivan Lone MD [place] AAHIVMS [facility] Hematology/Oncology Physician [facility]  (Office):       [phone] (Work cell):  [phone] (Fax):           [phone]  .I, Yevette Edwards, am acting as a Education administrator for Dr. Sullivan Lone.   .I have reviewed the above documentation for accuracy and completeness, and I agree with the above. Brunetta Genera MD

## 2019-07-08 ENCOUNTER — Encounter: Payer: Self-pay | Admitting: Hematology

## 2019-07-08 ENCOUNTER — Other Ambulatory Visit: Payer: Self-pay

## 2019-07-08 ENCOUNTER — Inpatient Hospital Stay: Payer: Medicare PPO | Attending: Hematology | Admitting: Hematology

## 2019-07-08 VITALS — BP 109/71 | HR 78 | Temp 98.0°F | Resp 18 | Ht 66.0 in | Wt 165.3 lb

## 2019-07-08 DIAGNOSIS — D472 Monoclonal gammopathy: Secondary | ICD-10-CM

## 2019-07-08 DIAGNOSIS — N200 Calculus of kidney: Secondary | ICD-10-CM | POA: Diagnosis not present

## 2019-07-08 DIAGNOSIS — R339 Retention of urine, unspecified: Secondary | ICD-10-CM | POA: Diagnosis not present

## 2019-07-09 ENCOUNTER — Telehealth: Payer: Self-pay | Admitting: Hematology

## 2019-07-09 NOTE — Telephone Encounter (Signed)
Scheduled appt per 1/5 los, sent a message to HIM pool to get a calendar mailed out. 

## 2019-07-10 NOTE — Patient Instructions (Signed)
dt ?

## 2019-07-16 ENCOUNTER — Telehealth: Payer: Self-pay | Admitting: Hematology

## 2019-07-16 NOTE — Telephone Encounter (Signed)
Returned patient's phone call regarding rescheduling an appointment, left a voicemail. 

## 2019-07-17 ENCOUNTER — Telehealth: Payer: Self-pay | Admitting: Hematology

## 2019-07-17 NOTE — Telephone Encounter (Signed)
Patient called regarding voicemail that was left, per patient's request July appointments have been rescheduled.

## 2019-07-23 ENCOUNTER — Ambulatory Visit: Payer: Medicare Other | Admitting: Cardiology

## 2019-09-18 ENCOUNTER — Other Ambulatory Visit: Payer: Self-pay | Admitting: Cardiology

## 2019-09-18 DIAGNOSIS — I10 Essential (primary) hypertension: Secondary | ICD-10-CM

## 2019-10-12 NOTE — Progress Notes (Signed)
NEUROLOGY FOLLOW UP OFFICE NOTE  Lindsey Rodriguez AC:[id number]  HISTORY OF PRESENT ILLNESS: Lindsey Rodriguez a 54 year old right-handed female with chronic neck and back pain with neuropathy, fibromyalgia, cardiomyopathy, MGUS, hypertension, plantar fasciitis, and history of kidney sones, depression and lumbosacral radiculopathy who follows up for migraine and small fiber neuropathy.  UPDATE: She underwent upper cervical nerve root ablation around December which has been effective for the neck pain andoccipital neuralgia.  However she has had increased in neck pain which has aggravated to posterior headaches about 3 weeks ago.  She has only had one actual migraine but she has moderate throbbing frontal headaches associated with nausea, photophobia, phonophobia and dizziness that are aggravated by change in weather.  They last 6 hours and have occurred 3 times in past 30 days.  She treats with an antihistamine and Tylenol.  Current NSAIDS:none Current analgesics:Tylenol Current triptans:no Current ergotamine:no Current anti-emetic:no Current muscle relaxants:Tizanidine4mg  PRN(also for neck stiffness) Current anti-anxiolytic:lorazepam Current sleep aide:no Current Antihypertensive medications:Lisinopril, Coreg Current Antidepressant medications:fluoxetine80mg  Current Anticonvulsant medications:none Current anti-CGRP:None Current Vitamins/Herbal/Supplements:CoQ10 30mg ; 65 Fe Current Antihistamines/Decongestants:no Other therapy:no  Depression:yes; Anxiety:yes  Last year, she reported increased numbness and tingling in the feet (worse on the right). 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 Labs from January, including B12, Sjogren's, Sed rate, ANA, and TSH were normal. B6 was elevated at 130.6. She was advised to discontinue any B6 containing foods. Punch skin biopsy was indicative of small fiber neuropathy. CT myelogram was ordered,  which revealed progressed damage at L2. She underwent bilateral epidural injection at L2 which worked for two days  She has known chronic neck pain status post C4-T2 fusion with degenerative disc disease of the cervical spine  HISTORY: Onset: Childhood Location:Varies (back of head, crown, bi-temporal, retro-orbital, band-like) Quality:Retro-orbital throbbing, band-like vice, stabbing at back of head Initial Intensity:4/10 constant, 8/10 when severe; August: Tension 6-9/10; Migraine 9/10 Aura:no Prodrome:no Associated symptoms: Nausea, photophobia, phonophobia, tunnel vision. No associated vomiting or unilateral numbness or weakness. Initial Duration:Constant but severe episodes 8 hours (with sumatriptan and naproxen); August: Tension 81min to 3 hours; Migraine until goes to sleep Initial Frequency:Daily but severe episodes occur once a week; August: Tension 3 days per week; Migraine once every 2 weeks) Triggers/aggravating factors: Emotional stress, working at the computer; seasonal allergies. Relieving factors:Ice packand laying in dark room Activity:Cannot function at least once a week.  She has chronic neck pain status post C4-T2 fusion. Last MRI of cervical spine from 11/26/14 showed progression of degenerative disc disease at C3-4 with left paracentral protrusion contacts and deforms the left hemicord.  Past NSAIDs:  Naproxen; ibuprofen Past analgesics:  Tramadol, lidocaine nasal drops Past triptans:  Relpax 40mg , Treximet, Maxalt, Zomig no, Zomig NS, sumatriptan 100mg , sumatriptan NS, Onzetra Xsail Past antihypertensives:  Propranolol (caused hypotension), amlodipine Past antidepressants:  Cymbalta, nortriptyline, venlafaxine, sertraline Past antiepileptics:  Topiramate, zonisamide, Lyrica (cognitive difficulties), Depakote, gabapentin. Past anti-CGRP:  Aimovig, Emgality (could not afford) Other past therapy:  biofeedback  In January2017, she  developed acute onset of low back pain with pain radiating down the posterior thigh, lateral lower leg and dorsum of foot to big toe on the right.There is no associated numbness or weakness.She has taken Flexeril, which is somewhat helpful, but makes her sleepy.Pain is improved but she still notes pain when she stands up. There was no preceding injury. MRI of lumbar spine from 09/26/15 showed postsurgical changes at L4-5 and L5-S1, as well as spinal stenosis at L2-3, but no  at the suspected level of L5. She hand a NCV-EMG performed on 09/28/15, which showed evidence of chronic L5 radiculopathy but nothing acute. She was referred to Dr. Renie Ora of pain management, where she was prescribed baclofen and Percocet and underwent bilateral L5/S1 transforaminal lumbar epidural steroid injections. They work but she has had recurrent pain. She underwent L3-4 fusion on 06/15/16.Due to worsening neck pain, MRI of cervical spine was performed on 12/21/2018 which demonstrated multilevel degenrative changes with mild facet disease at C2-3 and C3-4, post-surgical C4-T2 fusion with chronic deformity of right hemicord at C4-5, central and leftward protrusion at C3-4 with partial regression of disc herniation (since 2016) and slightly greater anterolisthesis related to facet arthropathy with slight left-sided cord flattening and slight left C4 foraminal narrowing.  She was referred to Dr. Payton Mccallum at [facility].  She has received two nerve blocks which were effective.  Of note, she continues to have ongoing shooting pain down the legs with associated numbness and sometimes weakness.   PAST MEDICAL HISTORY: Past Medical History:  Diagnosis Date  . Anemia   . Anxiety panic attacks   [name]  . Arthritis    knees  . Cardiomyopathy, nonischemic ([place]) 04/07/2016   EF left ventricule calculated on Lexiscan stress test at 33% with global hypokinesis.   . Chronic back pain   . Chronic neck pain     . Depression   . Dysrhythmia    pvc's with cardiomyopathy  . Family history of adverse reaction to anesthesia    mother has problems waking up  . Fibromyalgia    and neuropathy of feet  . History of kidney stones    lithotripsy  03/2016  . Hyperlipidemia   . Hypertension   . Kidney stone    History  . Kidney stones h/o  . Melanoma ([place]) RUE   [name]  . MGUS (monoclonal gammopathy of unknown significance) 06/2015   no treatment just watching recheck blood work 12/2015  . Migraine    OTC med prn - stress related  . Plantar fasciitis right  . Renal stones 2008/2011/2017  . Seasonal allergies   . SVD (spontaneous vaginal delivery)    x 3  . Tendonitis of foot right    MEDICATIONS: Current Outpatient Medications on File Prior to Visit  Medication Sig Dispense Refill  . ARIPiprazole (ABILIFY) 2 MG tablet Take 2 mg by mouth daily.    . carvedilol (COREG) 6.25 MG tablet TAKE 1 TABLET BY MOUTH TWICE A DAY WITH A MEAL 180 tablet 3  . ezetimibe (ZETIA) 10 MG tablet Take 1 tablet (10 mg total) by mouth daily. (Patient not taking: Reported on 05/26/2019) 30 tablet 2  . FLUoxetine (PROZAC) 40 MG capsule Take 80 mg by mouth daily.    Marland Kitchen lisinopril (ZESTRIL) 20 MG tablet Take 0.5 tablets (10 mg total) by mouth every evening. 90 tablet 1  . LORazepam (ATIVAN) 0.5 MG tablet Take 1-2 tablets (0.5-1 mg total) by mouth 3 (three) times daily as needed for anxiety. Prn anxiety 30 tablet 0  . MISC NATURAL PRODUCT OP Take 2 capsules by mouth daily. Omega-Q    . MISC NATURAL PRODUCTS PO Take 2 capsules by mouth daily. Rejuvenixx     . MISC NATURAL PRODUCTS PO Take 1 capsule by mouth 2 (two) times daily. Optimal- V    . MISC NATURAL PRODUCTS PO Take 1 capsule by mouth 2 (two) times daily. Optimal-M    . MISC NATURAL PRODUCTS PO  Take 1 capsule by mouth 2 (two) times daily. MagnaCal-D    . rosuvastatin (CRESTOR) 10 MG tablet Take 1 tablet (10 mg total) by mouth daily. (Patient not taking: Reported on  05/26/2019) 30 tablet 2  . tiZANidine (ZANAFLEX) 4 MG tablet Take 1 tablet (4 mg total) by mouth every 6 (six) hours as needed for muscle spasms. 30 tablet 5   No current facility-administered medications on file prior to visit.    ALLERGIES: Allergies  Allergen Reactions  . Penicillins Shortness Of Breath and Rash    Has patient had a PCN reaction causing immediate rash, facial/tongue/throat swelling, SOB or lightheadedness with hypotension: no Has patient had a PCN reaction causing severe rash involving mucus membranes or skin necrosis: no Has patient had a PCN reaction that required hospitalization no Has patient had a PCN reaction occurring within the last 10 years: no If all of the above answers are "NO", then may proceed with Cephalosporin use.   . Codeine Other (See Comments)    Manic depressive  . Voltaren [Diclofenac] Nausea And Vomiting and Other (See Comments)    Pt stated "tore stomach up"  . Erythromycin Rash    FAMILY HISTORY: Family History  Problem Relation Age of Onset  . Hypertension Mother   . Depression Mother   . Migraines Mother   . Hypothyroidism Mother   . COPD Father   . Heart failure Father 35  . Healthy Brother   . Healthy Daughter   . Healthy Son   . Healthy Daughter   . Diabetes Neg Hx    SOCIAL HISTORY: Social History   Socioeconomic History  . Marital status: Married    Spouse name: Remo Lipps  . Number of children: 3  . Years of education: Not on file  . Highest education level: Not on file  Occupational History  . Not on file  Tobacco Use  . Smoking status: Former Smoker    Packs/day: 0.10    Years: 3.00    Pack years: 0.30    Types: Cigarettes    Quit date: 07/04/1987    Years since quitting: 32.2  . Smokeless tobacco: Never Used  Substance and Sexual Activity  . Alcohol use: Yes    Alcohol/week: 0.0 standard drinks    Comment: rarely   . Drug use: No  . Sexual activity: Yes    Partners: Male    Birth control/protection:  Other-see comments, Surgical    Comment: husband had vasectomy; pt had hysterectomy  Other Topics Concern  . Not on file  Social History Narrative   Married.  Lives with husband,    1 daughter is taking a gap year with [employer] (college advising).   Son moved out 07/2018, local, 1 granddaughter from him (who lives with the mother).   brother-in-law (with Down's syndrome) , had a stroke, in SNF   1 dog.   1 daughter in [place], [place].   Disabled due to anxiety.  Previously worked as Public relations account executive.   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   Caffeine - coffee I cup in am/   Social Determinants of Health   Financial Resource Strain:   . Difficulty of Paying Living Expenses:   Food Insecurity:   . Worried About Charity fundraiser in the Last Year:   . Arboriculturist in the Last Year:   Transportation Needs:   . Film/video editor (Medical):   Marland Kitchen Lack of  Transportation (Non-Medical):   Physical Activity:   . Days of Exercise per Week:   . Minutes of Exercise per Session:   Stress:   . Feeling of Stress :   Social Connections:   . Frequency of Communication with Friends and Family:   . Frequency of Social Gatherings with Friends and Family:   . Attends Religious Services:   . Active Member of Clubs or Organizations:   . Attends Archivist Meetings:   Marland Kitchen Marital Status:   Intimate Partner Violence:   . Fear of Current or Ex-Partner:   . Emotionally Abused:   Marland Kitchen Physically Abused:   . Sexually Abused:     PHYSICAL EXAM: Blood pressure 128/82, pulse 63, height 5\' 7"  (1.702 m), weight 172 lb 6.4 oz (78.2 kg), last menstrual period 12/16/2012, SpO2 99 %. General: No acute distress.  Patient appears well-groomed.   Head:  Normocephalic/atraumatic Eyes:  Fundi examined but not visualized Neck: supple, no paraspinal tenderness, full range of motion Heart:  Regular rate and rhythm Lungs:  Clear to auscultation bilaterally Back: No paraspinal  tenderness Neurological Exam: alert and oriented to person, place, and time. Attention span and concentration intact, recent and remote memory intact, fund of knowledge intact.  Speech fluent and not dysarthric, language intact.  CN II-XII intact. Bulk and tone normal, muscle strength 5/5 throughout.  Sensation to light touch, temperature and vibration intact.  Deep tendon reflexes 2+ throughout, toes downgoing.  Finger to nose and heel to shin testing intact.  Gait normal, Romberg negative.  IMPRESSION: 1.  igraine without aura, without status migrainosus, not intractable 2.  Occipital neuralgia/cervicogenic headache 3.  Degenerative disc disease of cervical spine  Migraines are actually fairly well-controlled.  Her chronic neck pain/occipital neuralgia is the primary concern at this time.  She has already tried many neuroleptics used to treat neuropathic pain.  I defer to pain management.  PLAN: 1.  Tizanidine as needed 2.  Follow up with pain management 3.  Consider acupuncture or turmeric 4.  Follow up in 6 months.  Metta Clines, DO  CC: Rita Ohara, MD

## 2019-10-13 ENCOUNTER — Encounter: Payer: Self-pay | Admitting: Neurology

## 2019-10-13 ENCOUNTER — Ambulatory Visit: Payer: Medicare PPO | Admitting: Neurology

## 2019-10-13 ENCOUNTER — Other Ambulatory Visit: Payer: Self-pay

## 2019-10-13 VITALS — BP 128/82 | HR 63 | Ht 67.0 in | Wt 172.4 lb

## 2019-10-13 DIAGNOSIS — G43009 Migraine without aura, not intractable, without status migrainosus: Secondary | ICD-10-CM | POA: Diagnosis not present

## 2019-10-13 DIAGNOSIS — M5030 Other cervical disc degeneration, unspecified cervical region: Secondary | ICD-10-CM | POA: Diagnosis not present

## 2019-10-13 DIAGNOSIS — M5481 Occipital neuralgia: Secondary | ICD-10-CM | POA: Diagnosis not present

## 2019-10-13 NOTE — Patient Instructions (Signed)
1.  May consider trying acupuncture or taking turmeric 2.  Follow up in 21months

## 2019-10-14 NOTE — Progress Notes (Signed)
Office Visit Note  Patient: Lindsey Rodriguez             Date of Birth: 1966-06-02           MRN: [id number]             PCP: Rita Ohara, MD Referring: Rita Ohara, MD Visit Date: 10/23/2019 Occupation: _0 @  Subjective:  Pain in multiple joints   History of Present Illness: Lindsey Rodriguez is a 54 y.o. female with history of chondrocalcinosis, fibromyalgia, and osteoarthritis.  Patient reports that over the past 6 months has been having frequent and severe fibromyalgia flares.  She is having generalized myalgias and muscle tenderness at this time.  She states that she has not been able to identify a trigger for these recurrent flares.  She has not been under increased stress recently.  She is taking Zanaflex 4 mg 1 tablet by mouth every 6 hours as needed for muscle spasms.  She states that she has been unable to do her normal exercises due to the severity of her pain.  She has been trying to stretch on a daily basis but has persistent discomfort and stiffness.  She is also been experiencing increased discomfort in the right hip joint and right knee joint.  She denies any joint swelling at this time.  She states that she also has right CMC joint pain and has been unable to use her right hand at times due to the discomfort.  She continues to have chronic neck pain and performs neck exercises on a daily basis.  She states that since she has had increased discomfort and recurrent flares her anxiety and depression have been worsening.  She continues to take Abilify and Ativan as prescribed.   Activities of Daily Living:  Patient reports morning stiffness for 2 hours.   Patient Denies nocturnal pain.  Difficulty dressing/grooming: Denies Difficulty climbing stairs: Reports Difficulty getting out of chair: Denies Difficulty using hands for taps, buttons, cutlery, and/or writing: Denies  Review of Systems  Constitutional: Positive for fatigue.  HENT: Negative for mouth sores, mouth  dryness and nose dryness.   Eyes: Negative for pain, visual disturbance and dryness.  Respiratory: Positive for shortness of breath. Negative for cough, hemoptysis and difficulty breathing.   Cardiovascular: Negative for chest pain, palpitations, hypertension and swelling in legs/feet.  Gastrointestinal: Negative for blood in stool, constipation and diarrhea.  Endocrine: Negative for increased urination.  Genitourinary: Negative for difficulty urinating and painful urination.  Musculoskeletal: Positive for arthralgias, joint pain, muscle weakness, morning stiffness and muscle tenderness. Negative for joint swelling, myalgias and myalgias.  Skin: Negative for color change, pallor, rash, hair loss, nodules/bumps, skin tightness, ulcers and sensitivity to sunlight.  Allergic/Immunologic: Negative for susceptible to infections.  Neurological: Negative for dizziness and headaches.  Hematological: Negative for swollen glands.  Psychiatric/Behavioral: Negative for depressed mood and sleep disturbance. The patient is not nervous/anxious.     PMFS History:  Patient Active Problem List   Diagnosis Date Noted  . Irregular periods 12/25/2018  . Menopausal symptom 12/25/2018  . Occult blood in stools 12/25/2018  . Lumbar scoliosis 06/15/2016  . Chondrocalcinosis 05/15/2016  . Primary osteoarthritis of both knees 05/15/2016  . Tendinopathy of right shoulder 05/15/2016  . Spondylosis of lumbar region 05/15/2016  . Plantar fasciitis, bilateral 05/15/2016  . Renal calcinosis 05/15/2016  . Malignant melanoma of right upper extremity including shoulder ([place]) 05/15/2016  . Nonischemic cardiomyopathy ([place]) 04/10/2016  . Post op infection 12/18/2015  .  Postoperative state 11/24/2015  . Chronic migraine without aura without status migrainosus, not intractable 08/04/2015  . Fibromyalgia 07/26/2015  . MGUS (monoclonal gammopathy of unknown significance) 06/10/2015  . DJD (degenerative joint disease),  cervical 03/02/2015  . Migraine headache 09/05/2012  . Fatigue 10/26/2011  . Pure hyperglyceridemia 04/27/2011  . Essential hypertension, benign 04/17/2011  . Vitamin D deficiency 04/17/2011  . Neuropathy 12/22/2010  . Headache 12/22/2010    Past Medical History:  Diagnosis Date  . Anemia   . Anxiety panic attacks   [name]  . Arthritis    knees  . Cardiomyopathy, nonischemic ([place]) 04/07/2016   EF left ventricule calculated on Lexiscan stress test at 33% with global hypokinesis.   . Chronic back pain   . Chronic neck pain   . Depression   . Dysrhythmia    pvc's with cardiomyopathy  . Family history of adverse reaction to anesthesia    mother has problems waking up  . Fibromyalgia    and neuropathy of feet  . History of kidney stones    lithotripsy  03/2016  . Hyperlipidemia   . Hypertension   . Kidney stone    History  . Kidney stones h/o  . Melanoma ([place]) RUE   [name]  . MGUS (monoclonal gammopathy of unknown significance) 06/2015   no treatment just watching recheck blood work 12/2015  . Migraine    OTC med prn - stress related  . Plantar fasciitis right  . Renal stones 2008/2011/2017  . Seasonal allergies   . SVD (spontaneous vaginal delivery)    x 3  . Tendonitis of foot right    Family History  Problem Relation Age of Onset  . Hypertension Mother   . Depression Mother   . Migraines Mother   . Hypothyroidism Mother   . COPD Father   . Heart failure Father 30  . Healthy Brother   . Healthy Daughter   . Healthy Son   . Healthy Daughter   . Diabetes Neg Hx    Past Surgical History:  Procedure Laterality Date  . ABDOMINAL HYSTERECTOMY    . ABLATION ON ENDOMETRIOSIS N/A 11/24/2015   Procedure: ABLATION ON ENDOMETRIOSIS;  Surgeon: [name], MD;  Location: [facility];  Service: Gynecology;  Laterality: N/A;  . ANTERIOR LAT LUMBAR FUSION Right 06/15/2016   Procedure: RIGHT LUMBAR THREE-FOUR ANTEROLATERAL LUMBAR INTERBODY FUSION WITH LATERAL PLATE;   Surgeon: Erline Levine, MD;  Location: [facility];  Service: Neurosurgery;  Laterality: Right;  . BACK SURGERY  Dr.Stern, last sx Summer 2011   X 4  (L3-S1)  . BACK SURGERY  06/2016  . BLADDER SUSPENSION N/A 11/24/2015   Procedure: TRANSVAGINAL TAPE (TVT) PROCEDURE;  Surgeon: [name], MD;  Location: [facility];  Service: Gynecology;  Laterality: N/A;  . COLONOSCOPY    . CYSTOCELE REPAIR  11/24/2015   Procedure: ANTERIOR REPAIR (CYSTOCELE);  Surgeon: [name], MD;  Location: [facility];  Service: Gynecology;;  . Consuela Mimes N/A 11/24/2015   Procedure: CYSTOSCOPY;  Surgeon: [name], MD;  Location: [facility];  Service: Gynecology;  Laterality: N/A;  . DILATION AND CURETTAGE OF UTERUS    . ENDOMETRIAL ABLATION  08/2000  . EYE SURGERY Bilateral    Lasik   . KIDNEY STONE SURGERY  2011  . LITHOTRIPSY  08/2017 (L), 03/2018 (R)  . MELANOMA EXCISION  05/2015   right forearm; [name]  . NECK SURGERY  [name]  Summer 2011   X 4  (C4-T1 Level)  .  RHINOPLASTY    . ROBOTIC ASSISTED BILATERAL SALPINGO OOPHERECTOMY Bilateral 11/24/2015   Procedure: ROBOTIC ASSISTED BILATERAL SALPINGO OOPHORECTOMY;  Surgeon: [name], MD;  Location: [facility];  Service: Gynecology;  Laterality: Bilateral;--OVARIES NOT REMOVED PER PATH REPORT  . ROBOTIC ASSISTED TOTAL HYSTERECTOMY WITH SALPINGECTOMY Bilateral 11/24/2015   Procedure: ROBOTIC ASSISTED TOTAL HYSTERECTOMY WITH SALPINGECTOMY;  Surgeon: [name], MD;  Location: [facility];  Service: Gynecology;  Laterality: Bilateral;  . TONSILLECTOMY    . WISDOM TOOTH EXTRACTION     Social History   Social History Narrative   Married.  Lives with husband,    1 daughter is taking a gap year with [employer] (college advising).   Son moved out 07/2018, local, 1 granddaughter from him (who lives with the mother).   brother-in-law (with Down's syndrome) , had a stroke, in SNF   1 dog.   1 daughter in [place], [place].   Disabled due to anxiety.  Previously worked as  Public relations account executive.   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   Caffeine - coffee I cup in am/   Immunization History  Administered Date(s) Administered  . Hepatitis B 10/31/2008  . Influenza Split 03/18/2011, 04/02/2014, 05/01/2014  . Influenza Whole 07/03/2009  . Influenza, Seasonal, Injecte, Preservative Fre 06/04/2012  . Influenza,inj,Quad PF,6+ Mos 03/10/2013, 04/07/2015, 03/02/2016, 03/08/2017, 03/13/2018, 04/07/2019  . Influenza-Unspecified 05/03/2012, 04/07/2015  . MMR 07/03/1981  . Pneumococcal Conjugate-13 03/08/2017  . Tdap 07/03/1981, 09/05/2012     Objective: Vital Signs: BP 102/67 (BP Location: Left Arm, Patient Position: Sitting, Cuff Size: Normal)   Pulse 63   Resp 16   Ht 5' 6.75" (1.695 m)   Wt 170 lb 9.6 oz (77.4 kg)   LMP 12/16/2012   BMI 26.92 kg/m    Physical Exam Vitals and nursing note reviewed.  Constitutional:      Appearance: She is well-developed.  HENT:     Head: Normocephalic and atraumatic.  Eyes:     Conjunctiva/sclera: Conjunctivae normal.  Pulmonary:     Effort: Pulmonary effort is normal.  Abdominal:     General: Bowel sounds are normal.     Palpations: Abdomen is soft.  Musculoskeletal:     Cervical back: Normal range of motion.  Lymphadenopathy:     Cervical: No cervical adenopathy.  Skin:    General: Skin is warm and dry.     Capillary Refill: Capillary refill takes less than 2 seconds.  Neurological:     Mental Status: She is alert and oriented to person, place, and time.  Psychiatric:        Behavior: Behavior normal.      Musculoskeletal Exam: Generalized hyperalgesia and positive tender points on exam.  C-spine has slightly limited range of motion with discomfort.  Trapezius muscle spasms bilaterally.  Thoracic and lumbar spine have good range of motion.  No midline spinal tenderness.  No SI joint tenderness.  Shoulder joints, elbow joints, wrist joints, MCPs, PIPs and DIPs good range of motion with no  synovitis.  She has tenderness of the right CMC joint.  She has complete fist formation bilaterally.  She has painful and limited range of motion of the right hip.  Left hip is full range of motion with no discomfort.  Knee joints have good range of motion with no warmth or effusion on exam.  Ankle joints have good range of motion with no tenderness or inflammation.  Pes cavus noted bilaterally.  No tenderness of  MTP joints.  She has mild osteoarthritic changes in both feet.  CDAI Exam: CDAI Score: -- Patient Global: --; Provider Global: -- Swollen: --; Tender: -- Joint Exam 10/23/2019   No joint exam has been documented for this visit   There is currently no information documented on the homunculus. Go to the Rheumatology activity and complete the homunculus joint exam.  Investigation: No additional findings.  Imaging: XR HIP UNILAT W OR W/O PELVIS 2-3 VIEWS RIGHT  Result Date: 10/23/2019 No SI joint sclerosis or narrowing was noted.  Some sclerosis around the hip joint was noted which could be consistent with early osteoarthritis. Impression: Findings are consistent with early osteoarthritis of the hip joint.   Recent Labs: Lab Results  Component Value Date   WBC 5.6 06/23/2019   HGB 13.3 06/23/2019   PLT 259 06/23/2019   NA 138 06/23/2019   K 4.6 06/23/2019   CL 101 06/23/2019   CO2 29 06/23/2019   GLUCOSE 90 06/23/2019   BUN 13 06/23/2019   CREATININE 0.85 06/23/2019   BILITOT 0.5 06/23/2019   ALKPHOS 61 06/23/2019   AST 24 06/23/2019   ALT 31 06/23/2019   PROT 8.1 06/23/2019   ALBUMIN 4.6 06/23/2019   CALCIUM 9.8 06/23/2019   GFRAA >60 06/23/2019    Speciality Comments: No specialty comments available.  Procedures:  No procedures performed Allergies: Penicillins, Codeine, Voltaren [diclofenac], and Erythromycin   Assessment / Plan:     Visit Diagnoses: Chondrocalcinosis: She has not had any recent flares.  She is experiencing increased discomfort in the right  knee joint but has no warmth or effusion on exam.  She declined x-rays of the right knee.  She declined cortisone junction of the right knee joint today.  Primary osteoarthritis of both knees: She has been experiencing increased discomfort in both knee joints but has good ROM with no warmth or effusion on exam.  Her right knee joint has been causing severe discomfort which has been preventing her from performing weight bearing exercises.  She has not had a mechanical symptoms at this time.  She has been experiencing nocturnal pain intermittently.  She declined x-rays and a cortisone injection today.  She is given a handout of knee joint exercises to perform.  DDD (degenerative disc disease), cervical: She has slightly limited range of motion with some discomfort.  She is trapezius muscle tension and muscle tenderness bilaterally.  She experiences muscle spasms intermittently and takes Zanaflex 4 mg 3 times daily as needed for muscle spasms.  She performs neck exercises on a daily basis.  Spondylosis of lumbar region: She experiences intermittent lower back pain.  She has no symptoms of radiculopathy.  Plantar fasciitis, bilateral: Resolved  Fibromyalgia: She has generalized hyperalgesia and positive tender points on exam.  She has been experiencing severe recurrent fibromyalgia flares for the past 6 months.  She is experiencing severe generalized myalgias and muscle tenderness at this time.  She has been unable to exercise as much as she would like to due to the overall discomfort.  She has been experiencing nocturnal pain and increased fatigue.  She states that when she falls asleep it is the only time she does not have pain.  Since she has been experiencing increased discomfort she has been having worsening anxiety and depression.  She continues to take Abilify and Ativan as prescribed.  We discussed the importance of regular exercise and good sleep hygiene.  She declined a referral to integrative  therapies at this time.  Pain in right hip - She presents today with right hip joint pain which started about 4 months ago.  She has been experiencing pain radiating into the right groin.  She has no tenderness over the right trochanteric bursa on exam today.  She has slight limited range of motion with discomfort of the right hip.  X-rays of the right hip were obtained today.  Plan: XR HIP UNILAT W OR W/O PELVIS 2-3 VIEWS RIGHT  Lateral epicondylitis, left elbow: She has tenderness to palpation over the lateral epicondyle of the left elbow on exam.  Different treatment options were discussed.  She was given a handout of exercises to perform.  She was also given a prescription for a left lateral epicondyle brace.  She declined physical therapy at this time.  We discussed that if her symptoms persist or worsen she can go to physical therapy or return for a cortisone injection.  Other medical conditions are listed as follows:  Nonischemic cardiomyopathy ([place])  Neuropathy  Renal calcinosis  History of hypertension  MGUS (monoclonal gammopathy of unknown significance)  Malignant melanoma of right upper extremity including shoulder ([place])   Orders: Orders Placed This Encounter  Procedures  . XR HIP UNILAT W OR W/O PELVIS 2-3 VIEWS RIGHT   No orders of the defined types were placed in this encounter.   Face-to-face time spent with patient was 30 minutes. Greater than 50% of time was spent in counseling and coordination of care.  Follow-Up Instructions: Return in about 6 months (around 04/23/2020) for Chondrocalcinosis, Fibromyalgia, Osteoarthritis.   Leonia Reader  I examined and evaluated the patient with Hazel Sams PA.  She continues to have some generalized pain from osteoarthritis and fibromyalgia.  She was having increased pain and discomfort in her right hip joint.  The x-ray today showed mild osteoarthritic changes.  We will refer her to orthopedics for evaluation as she is  having significant discomfort.  The plan of care was discussed as noted above.  Bo Merino, MD  Note - This record has been created using Editor, commissioning.  Chart creation errors have been sought, but may not always  have been located. Such creation errors do not reflect on  the standard of medical care.

## 2019-10-23 ENCOUNTER — Encounter: Payer: Self-pay | Admitting: Rheumatology

## 2019-10-23 ENCOUNTER — Other Ambulatory Visit: Payer: Self-pay

## 2019-10-23 ENCOUNTER — Ambulatory Visit: Payer: Medicare PPO | Admitting: Rheumatology

## 2019-10-23 ENCOUNTER — Ambulatory Visit: Payer: Self-pay

## 2019-10-23 VITALS — BP 102/67 | HR 63 | Resp 16 | Ht 66.75 in | Wt 170.6 lb

## 2019-10-23 DIAGNOSIS — I428 Other cardiomyopathies: Secondary | ICD-10-CM | POA: Diagnosis not present

## 2019-10-23 DIAGNOSIS — M5030 Other cervical disc degeneration, unspecified cervical region: Secondary | ICD-10-CM | POA: Diagnosis not present

## 2019-10-23 DIAGNOSIS — Z8679 Personal history of other diseases of the circulatory system: Secondary | ICD-10-CM

## 2019-10-23 DIAGNOSIS — N29 Other disorders of kidney and ureter in diseases classified elsewhere: Secondary | ICD-10-CM

## 2019-10-23 DIAGNOSIS — G629 Polyneuropathy, unspecified: Secondary | ICD-10-CM | POA: Diagnosis not present

## 2019-10-23 DIAGNOSIS — M47816 Spondylosis without myelopathy or radiculopathy, lumbar region: Secondary | ICD-10-CM

## 2019-10-23 DIAGNOSIS — M7712 Lateral epicondylitis, left elbow: Secondary | ICD-10-CM

## 2019-10-23 DIAGNOSIS — M170 Bilateral primary osteoarthritis of knee: Secondary | ICD-10-CM | POA: Diagnosis not present

## 2019-10-23 DIAGNOSIS — M722 Plantar fascial fibromatosis: Secondary | ICD-10-CM

## 2019-10-23 DIAGNOSIS — M25551 Pain in right hip: Secondary | ICD-10-CM | POA: Diagnosis not present

## 2019-10-23 DIAGNOSIS — D472 Monoclonal gammopathy: Secondary | ICD-10-CM

## 2019-10-23 DIAGNOSIS — C4361 Malignant melanoma of right upper limb, including shoulder: Secondary | ICD-10-CM

## 2019-10-23 DIAGNOSIS — M797 Fibromyalgia: Secondary | ICD-10-CM

## 2019-10-23 DIAGNOSIS — M1120 Other chondrocalcinosis, unspecified site: Secondary | ICD-10-CM

## 2019-10-23 NOTE — Addendum Note (Signed)
Addended by: Earnestine Mealing on: 10/23/2019 02:30 PM   Modules accepted: Orders

## 2019-10-23 NOTE — Patient Instructions (Signed)
Journal for Nurse Practitioners, 15(4), 263-267. Retrieved April 08, 2018 from http://clinicalkey.com/nursing">  Knee Exercises Ask your health care provider which exercises are safe for you. Do exercises exactly as told by your health care provider and adjust them as directed. It is normal to feel mild stretching, pulling, tightness, or discomfort as you do these exercises. Stop right away if you feel sudden pain or your pain gets worse. Do not begin these exercises until told by your health care provider. Stretching and range-of-motion exercises These exercises warm up your muscles and joints and improve the movement and flexibility of your knee. These exercises also help to relieve pain and swelling. Knee extension, prone 1. Lie on your abdomen (prone position) on a bed. 2. Place your left / right knee just beyond the edge of the surface so your knee is not on the bed. You can put a towel under your left / right thigh just above your kneecap for comfort. 3. Relax your leg muscles and allow gravity to straighten your knee (extension). You should feel a stretch behind your left / right knee. 4. Hold this position for __________ seconds. 5. Scoot up so your knee is supported between repetitions. Repeat __________ times. Complete this exercise __________ times a day. Knee flexion, active  1. Lie on your back with both legs straight. If this causes back discomfort, bend your left / right knee so your foot is flat on the floor. 2. Slowly slide your left / right heel back toward your buttocks. Stop when you feel a gentle stretch in the front of your knee or thigh (flexion). 3. Hold this position for __________ seconds. 4. Slowly slide your left / right heel back to the starting position. Repeat __________ times. Complete this exercise __________ times a day. Quadriceps stretch, prone  1. Lie on your abdomen on a firm surface, such as a bed or padded floor. 2. Bend your left / right knee and hold  your ankle. If you cannot reach your ankle or pant leg, loop a belt around your foot and grab the belt instead. 3. Gently pull your heel toward your buttocks. Your knee should not slide out to the side. You should feel a stretch in the front of your thigh and knee (quadriceps). 4. Hold this position for __________ seconds. Repeat __________ times. Complete this exercise __________ times a day. Hamstring, supine 1. Lie on your back (supine position). 2. Loop a belt or towel over the ball of your left / right foot. The ball of your foot is on the walking surface, right under your toes. 3. Straighten your left / right knee and slowly pull on the belt to raise your leg until you feel a gentle stretch behind your knee (hamstring). ? Do not let your knee bend while you do this. ? Keep your other leg flat on the floor. 4. Hold this position for __________ seconds. Repeat __________ times. Complete this exercise __________ times a day. Strengthening exercises These exercises build strength and endurance in your knee. Endurance is the ability to use your muscles for a long time, even after they get tired. Quadriceps, isometric This exercise stretches the muscles in front of your thigh (quadriceps) without moving your knee joint (isometric). 1. Lie on your back with your left / right leg extended and your other knee bent. Put a rolled towel or small pillow under your knee if told by your health care provider. 2. Slowly tense the muscles in the front of your left /   right thigh. You should see your kneecap slide up toward your hip or see increased dimpling just above the knee. This motion will push the back of the knee toward the floor. 3. For __________ seconds, hold the muscle as tight as you can without increasing your pain. 4. Relax the muscles slowly and completely. Repeat __________ times. Complete this exercise __________ times a day. Straight leg raises This exercise stretches the muscles in front  of your thigh (quadriceps) and the muscles that move your hips (hip flexors). 1. Lie on your back with your left / right leg extended and your other knee bent. 2. Tense the muscles in the front of your left / right thigh. You should see your kneecap slide up or see increased dimpling just above the knee. Your thigh may even shake a bit. 3. Keep these muscles tight as you raise your leg 4-6 inches (10-15 cm) off the floor. Do not let your knee bend. 4. Hold this position for __________ seconds. 5. Keep these muscles tense as you lower your leg. 6. Relax your muscles slowly and completely after each repetition. Repeat __________ times. Complete this exercise __________ times a day. Hamstring, isometric 1. Lie on your back on a firm surface. 2. Bend your left / right knee about __________ degrees. 3. Dig your left / right heel into the surface as if you are trying to pull it toward your buttocks. Tighten the muscles in the back of your thighs (hamstring) to "dig" as hard as you can without increasing any pain. 4. Hold this position for __________ seconds. 5. Release the tension gradually and allow your muscles to relax completely for __________ seconds after each repetition. Repeat __________ times. Complete this exercise __________ times a day. Hamstring curls If told by your health care provider, do this exercise while wearing ankle weights. Begin with __________ lb weights. Then increase the weight by 1 lb (0.5 kg) increments. Do not wear ankle weights that are more than __________ lb. 1. Lie on your abdomen with your legs straight. 2. Bend your left / right knee as far as you can without feeling pain. Keep your hips flat against the floor. 3. Hold this position for __________ seconds. 4. Slowly lower your leg to the starting position. Repeat __________ times. Complete this exercise __________ times a day. Squats This exercise strengthens the muscles in front of your thigh and knee  (quadriceps). 1. Stand in front of a table, with your feet and knees pointing straight ahead. You may rest your hands on the table for balance but not for support. 2. Slowly bend your knees and lower your hips like you are going to sit in a chair. ? Keep your weight over your heels, not over your toes. ? Keep your lower legs upright so they are parallel with the table legs. ? Do not let your hips go lower than your knees. ? Do not bend lower than told by your health care provider. ? If your knee pain increases, do not bend as low. 3. Hold the squat position for __________ seconds. 4. Slowly push with your legs to return to standing. Do not use your hands to pull yourself to standing. Repeat __________ times. Complete this exercise __________ times a day. Wall slides This exercise strengthens the muscles in front of your thigh and knee (quadriceps). 1. Lean your back against a smooth wall or door, and walk your feet out 18-24 inches (46-61 cm) from it. 2. Place your feet hip-width apart. 3.   Slowly slide down the wall or door until your knees bend __________ degrees. Keep your knees over your heels, not over your toes. Keep your knees in line with your hips. 4. Hold this position for __________ seconds. Repeat __________ times. Complete this exercise __________ times a day. Straight leg raises This exercise strengthens the muscles that rotate the leg at the hip and move it away from your body (hip abductors). 1. Lie on your side with your left / right leg in the top position. Lie so your head, shoulder, knee, and hip line up. You may bend your bottom knee to help you keep your balance. 2. Roll your hips slightly forward so your hips are stacked directly over each other and your left / right knee is facing forward. 3. Leading with your heel, lift your top leg 4-6 inches (10-15 cm). You should feel the muscles in your outer hip lifting. ? Do not let your foot drift forward. ? Do not let your knee  roll toward the ceiling. 4. Hold this position for __________ seconds. 5. Slowly return your leg to the starting position. 6. Let your muscles relax completely after each repetition. Repeat __________ times. Complete this exercise __________ times a day. Straight leg raises This exercise stretches the muscles that move your hips away from the front of the pelvis (hip extensors). 1. Lie on your abdomen on a firm surface. You can put a pillow under your hips if that is more comfortable. 2. Tense the muscles in your buttocks and lift your left / right leg about 4-6 inches (10-15 cm). Keep your knee straight as you lift your leg. 3. Hold this position for __________ seconds. 4. Slowly lower your leg to the starting position. 5. Let your leg relax completely after each repetition. Repeat __________ times. Complete this exercise __________ times a day. This information is not intended to replace advice given to you by your health care provider. Make sure you discuss any questions you have with your health care provider. Document Revised: 04/09/2018 Document Reviewed: 04/09/2018 Elsevier Patient Education  [place] Ask your health care provider which exercises are safe for you. Do exercises exactly as told by your health care provider and adjust them as directed. It is normal to feel mild stretching, pulling, tightness, or discomfort as you do these exercises. Stop right away if you feel sudden pain or your pain gets worse. Do not begin these exercises until told by your health care provider. Stretching and range-of-motion exercises These exercises warm up your muscles and joints and improve the movement and flexibility of your elbow. These exercises also help to relieve pain, numbness, and tingling. Wrist flexion, assisted  6. Straighten your left / right elbow in front of you with your palm facing down toward the floor. ? If told by your health care provider, bend your  left / right elbow to a 90-degree angle (right angle) at your side. 7. With your other hand, gently push over the back of your left / right hand so your fingers point toward the floor (flexion). Stop when you feel a gentle stretch on the back of your forearm. 8. Hold this position for __________ seconds. Repeat __________ times. Complete this exercise __________ times a day. Wrist extension, assisted  5. Straighten your left / right elbow in front of you with your palm facing up toward the ceiling. ? If told by your health care provider, bend your left / right elbow to a 90-degree  angle (right angle) at your side. 6. With your other hand, gently pull your left / right hand and fingers toward the floor (extension). Stop when you feel a gentle stretch on the palm side of your forearm. 7. Hold this position for __________ seconds. Repeat __________ times. Complete this exercise __________ times a day. Assisted forearm rotation, supination 5. Sit or stand with your left / right elbow bent to a 90-degree angle (right angle) at your side. 6. Using your uninjured hand, turn (rotate) your left / right palm up toward the ceiling (supination) until you feel a gentle stretch along the inside of your forearm. 7. Hold this position for __________ seconds. Repeat __________ times. Complete this exercise __________ times a day. Assisted forearm rotation, pronation 5. Sit or stand with your left / right elbow bent to a 90-degree angle (right angle) at your side. 6. Using your uninjured hand, rotate your left / right palm down toward the floor (pronation) until you feel a gentle stretch along the outside of your forearm. 7. Hold this position for __________ seconds. Repeat __________ times. Complete this exercise __________ times a day. Strengthening exercises These exercises build strength and endurance in your forearm and elbow. Endurance is the ability to use your muscles for a long time, even after they get  tired. Radial deviation  5. Stand with a __________ weight or a hammer in your left / right hand. Or, sit while holding a rubber exercise band or tubing, with your left / right forearm supported on a table or countertop. ? If you are standing, position your forearm so that your thumb is facing forward. If you are sitting, position your forearm so that the thumb is facing the ceiling. This is the neutral position. 6. Raise your hand upward in front of you so your thumb moves toward the ceiling (radial deviation), or pull up on the rubber tubing. Keep your forearm and elbow still while you move your wrist only. 7. Hold this position for __________ seconds. 8. Slowly return to the starting position. Repeat __________ times. Complete this exercise __________ times a day. Wrist extension, eccentric 7. Sit with your left / right forearm palm-down and supported on a table or other surface. Let your left / right wrist extend over the edge of the surface. 8. Hold a __________ weight or a piece of exercise band or tubing in your left / right hand. ? If using a rubber exercise band or tubing, hold the other end of the tubing with your other hand. 9. Use your uninjured hand to move your left / right hand up toward the ceiling. 10. Take your uninjured hand away and slowly return to the starting position using only your left / right hand. Lowering your arm under tension is called eccentric extension. Repeat __________ times. Complete this exercise __________ times a day. Wrist extension Do not do this exercise if it causes pain at the outside of your elbow. Only do this exercise once instructed by your health care provider. 6. Sit with your left / right forearm supported on a table or other surface and your palm turned down toward the floor. Let your left / right wrist extend over the edge of the surface. 7. Hold a __________ weight or a piece of rubber exercise band or tubing. ? If you are using a rubber  exercise band or tubing, hold the band or tubing in place with your other hand to provide resistance. 8. Slowly bend your wrist so your hand  moves up toward the ceiling (extension). Move only your wrist, keeping your forearm and elbow still. 9. Hold this position for __________ seconds. 10. Slowly return to the starting position. Repeat __________ times. Complete this exercise __________ times a day. Forearm rotation, supination To do this exercise, you will need a lightweight hammer or rubber mallet. 5. Sit with your left / right forearm supported on a table or other surface. Bend your elbow to a 90-degree angle (right angle). Position your forearm so that your palm is facing down toward the floor, with your hand resting over the edge of the table. 6. Hold a hammer in your left / right hand. ? To make this exercise easier, hold the hammer near the head of the hammer. ? To make this exercise harder, hold the hammer near the end of the handle. 7. Without moving your wrist or elbow, slowly rotate your forearm so your palm faces up toward the ceiling (supination). 8. Hold this position for __________ seconds. 9. Slowly return to the starting position. Repeat __________ times. Complete this exercise __________ times a day. Shoulder blade squeeze 5. Sit in a stable chair or stand with good posture. If you are sitting down, do not let your back touch the back of the chair. 6. Your arms should be at your sides with your elbows bent to a 90-degree angle (right angle). Position your forearms so that your thumbs are facing the ceiling (neutral position). 7. Without lifting your shoulders up, squeeze your shoulder blades tightly together. 8. Hold this position for __________ seconds. 9. Slowly release and return to the starting position. Repeat __________ times. Complete this exercise __________ times a day. This information is not intended to replace advice given to you by your health care provider. Make  sure you discuss any questions you have with your health care provider. Document Revised: 10/10/2018 Document Reviewed: 08/13/2018 Elsevier Patient Education  [place].

## 2019-10-30 ENCOUNTER — Encounter: Payer: Self-pay | Admitting: Orthopaedic Surgery

## 2019-10-30 ENCOUNTER — Other Ambulatory Visit: Payer: Self-pay

## 2019-10-30 ENCOUNTER — Ambulatory Visit: Payer: Medicare PPO | Admitting: Orthopaedic Surgery

## 2019-10-30 VITALS — Ht 66.75 in | Wt 170.0 lb

## 2019-10-30 DIAGNOSIS — M25551 Pain in right hip: Secondary | ICD-10-CM | POA: Diagnosis not present

## 2019-10-30 NOTE — Progress Notes (Signed)
Office Visit Note   Patient: Lindsey Rodriguez           Date of Birth: [birth year]           MRN: [id number] Visit Date: 10/30/2019              Requested by: Ofilia Neas, PA-C [address] [place],  [place] [address] PCP: Rita Ohara, MD   Assessment & Plan: Visit Diagnoses:  1. Pain in right hip     Plan: Given the pain she is having in her right hip I do feel that she is an appropriate candidate for an intra-articular steroid injection under direct ultrasound by Dr. Junius Roads.  We will set up an appointment for her to see Dr. Junius Roads next week for this injection and he can evaluate her hip further under ultrasound guidance.  She agrees with this treatment plan.  She is someone that if this gives her great relief and her pain does not come back this will be successful.  However she still has continued pain I would recommend a MRI arthrogram of the right hip to assess the cartilage and the labrum.  All questions and concerns were answered and addressed.  Follow-Up Instructions: No follow-ups on file.   Orders:  No orders of the defined types were placed in this encounter.  No orders of the defined types were placed in this encounter.     Procedures: No procedures performed   Clinical Data: No additional findings.   Subjective: Chief Complaint  Patient presents with  . Right Hip - Pain  The patient is a very pleasant 54 year old female sent from rheumatology to evaluate and treat significant right hip joint pain.  She does report pain with rotating her hip and with weightbearing with the right hip.  It hurts mainly in the groin.  This is been going on for about 6 months now and slowly getting worse.  Dr.Deveschwar sent her this way to evaluate and treat the pain of this hip.  X-rays are on the canopy system for me to review.  She does have a history of fibromyalgia.  She also has a history of significant lumbar spine surgery.  She denies any radicular symptoms and her  pain is only in the right hip joint itself.  She denies any injuries that she is aware of.  She has never had hip surgery.  HPI  Review of Systems She currently denies any acute change in her medical status.  She denies any fever, chills, nausea, vomiting  Objective: Vital Signs: Ht 5' 6.75" (1.695 m)   Wt 170 lb (77.1 kg)   LMP 12/16/2012   BMI 26.83 kg/m   Physical Exam She is alert and orient x3 and in no acute distress Ortho Exam When she goes from a sitting to standing position she has a stand there for a minute or so due to the severe pain in her hip and then she is able to walk to get on the exam table.  Her left hip exam is entirely normal with fluid and full range of motion.  I am able to examine her right hip and put her through internal extra rotation with no blocks to rotation and it moves smoothly but there is definitely some pain in the groin when I put her hip through rotation. Specialty Comments:  No specialty comments available.  Imaging: No results found. An AP pelvis and lateral of the right hip shows a well-maintained joint space  of both hips.  There is no cortical irregularities around the femoral head or acetabulum and the femoral head appears round and smooth.  There is some calcifications just off the lateral edge of the hip joint itself then I am not sure if they are specific for anything.  PMFS History: Patient Active Problem List   Diagnosis Date Noted  . Irregular periods 12/25/2018  . Menopausal symptom 12/25/2018  . Occult blood in stools 12/25/2018  . Lumbar scoliosis 06/15/2016  . Chondrocalcinosis 05/15/2016  . Primary osteoarthritis of both knees 05/15/2016  . Tendinopathy of right shoulder 05/15/2016  . Spondylosis of lumbar region 05/15/2016  . Plantar fasciitis, bilateral 05/15/2016  . Renal calcinosis 05/15/2016  . Malignant melanoma of right upper extremity including shoulder ([facility]) 05/15/2016  . Nonischemic cardiomyopathy ([facility]) 04/10/2016    . Post op infection 12/18/2015  . Postoperative state 11/24/2015  . Chronic migraine without aura without status migrainosus, not intractable 08/04/2015  . Fibromyalgia 07/26/2015  . MGUS (monoclonal gammopathy of unknown significance) 06/10/2015  . DJD (degenerative joint disease), cervical 03/02/2015  . Migraine headache 09/05/2012  . Fatigue 10/26/2011  . Pure hyperglyceridemia 04/27/2011  . Essential hypertension, benign 04/17/2011  . Vitamin D deficiency 04/17/2011  . Neuropathy 12/22/2010  . Headache 12/22/2010   Past Medical History:  Diagnosis Date  . Anemia   . Anxiety panic attacks   [name]  . Arthritis    knees  . Cardiomyopathy, nonischemic ([facility]) 04/07/2016   EF left ventricule calculated on Lexiscan stress test at 33% with global hypokinesis.   . Chronic back pain   . Chronic neck pain   . Depression   . Dysrhythmia    pvc's with cardiomyopathy  . Family history of adverse reaction to anesthesia    mother has problems waking up  . Fibromyalgia    and neuropathy of feet  . History of kidney stones    lithotripsy  03/2016  . Hyperlipidemia   . Hypertension   . Kidney stone    History  . Kidney stones h/o  . Melanoma ([facility]) RUE   [name]  . MGUS (monoclonal gammopathy of unknown significance) 06/2015   no treatment just watching recheck blood work 12/2015  . Migraine    OTC med prn - stress related  . Plantar fasciitis right  . Renal stones 2008/2011/2017  . Seasonal allergies   . SVD (spontaneous vaginal delivery)    x 3  . Tendonitis of foot right    Family History  Problem Relation Age of Onset  . Hypertension Mother   . Depression Mother   . Migraines Mother   . Hypothyroidism Mother   . COPD Father   . Heart failure Father 56  . Healthy Brother   . Healthy Daughter   . Healthy Son   . Healthy Daughter   . Diabetes Neg Hx     Past Surgical History:  Procedure Laterality Date  . ABDOMINAL HYSTERECTOMY    . ABLATION ON ENDOMETRIOSIS  N/A 11/24/2015   Procedure: ABLATION ON ENDOMETRIOSIS;  Surgeon: [name], MD;  Location: [facility];  Service: Gynecology;  Laterality: N/A;  . ANTERIOR LAT LUMBAR FUSION Right 06/15/2016   Procedure: RIGHT LUMBAR THREE-FOUR ANTEROLATERAL LUMBAR INTERBODY FUSION WITH LATERAL PLATE;  Surgeon: Erline Levine, MD;  Location: [facility];  Service: Neurosurgery;  Laterality: Right;  . BACK SURGERY  Dr.Stern, last sx Summer 2011   X 4  (L3-S1)  . BACK SURGERY  06/2016  . BLADDER SUSPENSION  N/A 11/24/2015   Procedure: TRANSVAGINAL TAPE (TVT) PROCEDURE;  Surgeon: [name], MD;  Location: [facility];  Service: Gynecology;  Laterality: N/A;  . COLONOSCOPY    . CYSTOCELE REPAIR  11/24/2015   Procedure: ANTERIOR REPAIR (CYSTOCELE);  Surgeon: [name], MD;  Location: [facility];  Service: Gynecology;;  . Consuela Mimes N/A 11/24/2015   Procedure: CYSTOSCOPY;  Surgeon: [name], MD;  Location: [facility];  Service: Gynecology;  Laterality: N/A;  . DILATION AND CURETTAGE OF UTERUS    . ENDOMETRIAL ABLATION  08/2000  . EYE SURGERY Bilateral    Lasik   . KIDNEY STONE SURGERY  2011  . LITHOTRIPSY  08/2017 (L), 03/2018 (R)  . MELANOMA EXCISION  05/2015   right forearm; [name]  . NECK SURGERY  [name]  Summer 2011   X 4  (C4-T1 Level)  . RHINOPLASTY    . ROBOTIC ASSISTED BILATERAL SALPINGO OOPHERECTOMY Bilateral 11/24/2015   Procedure: ROBOTIC ASSISTED BILATERAL SALPINGO OOPHORECTOMY;  Surgeon: [name], MD;  Location: [facility];  Service: Gynecology;  Laterality: Bilateral;--OVARIES NOT REMOVED PER PATH REPORT  . ROBOTIC ASSISTED TOTAL HYSTERECTOMY WITH SALPINGECTOMY Bilateral 11/24/2015   Procedure: ROBOTIC ASSISTED TOTAL HYSTERECTOMY WITH SALPINGECTOMY;  Surgeon: [name], MD;  Location: [facility];  Service: Gynecology;  Laterality: Bilateral;  . TONSILLECTOMY    . WISDOM TOOTH EXTRACTION     Social History   Occupational History  . Not on file  Tobacco Use  . Smoking status: Former  Smoker    Packs/day: 0.10    Years: 3.00    Pack years: 0.30    Types: Cigarettes    Quit date: 07/04/1987    Years since quitting: 32.3  . Smokeless tobacco: Never Used  Substance and Sexual Activity  . Alcohol use: Yes    Alcohol/week: 0.0 standard drinks    Comment: rarely   . Drug use: No  . Sexual activity: Yes    Partners: Male    Birth control/protection: Other-see comments, Surgical    Comment: husband had vasectomy; pt had hysterectomy

## 2019-11-05 ENCOUNTER — Encounter: Payer: Self-pay | Admitting: Family Medicine

## 2019-11-05 ENCOUNTER — Ambulatory Visit: Payer: Medicare PPO | Admitting: Family Medicine

## 2019-11-05 ENCOUNTER — Ambulatory Visit: Payer: Self-pay

## 2019-11-05 ENCOUNTER — Other Ambulatory Visit: Payer: Self-pay

## 2019-11-05 DIAGNOSIS — M25551 Pain in right hip: Secondary | ICD-10-CM

## 2019-11-05 NOTE — Progress Notes (Signed)
Subjective: Patient is here for ultrasound-guided intra-articular right hip injection.   She has groin pain and mild-moderate DJD.  Objective:  Pain with IR and ER, with decreased ROM.  Procedure: Ultrasound-guided right hip injection: After sterile prep with Betadine, injected 8 cc 1% lidocaine without epinephrine and 40 mg methylprednisolone using a 22-gauge spinal needle, passing the needle through the iliofemoral ligament into the femoral head/neck junction.  Injectate seen filling joint capsule.  Modest immediate improvement.

## 2019-11-19 ENCOUNTER — Other Ambulatory Visit: Payer: Self-pay

## 2019-11-19 ENCOUNTER — Encounter: Payer: Self-pay | Admitting: Orthopaedic Surgery

## 2019-11-19 ENCOUNTER — Ambulatory Visit: Payer: Medicare PPO | Admitting: Orthopaedic Surgery

## 2019-11-19 DIAGNOSIS — M25551 Pain in right hip: Secondary | ICD-10-CM | POA: Diagnosis not present

## 2019-11-19 NOTE — Progress Notes (Signed)
The patient is a very pleasant and active 54 year old female who is returning for follow-up after having an intra-articular injection of the steroid in her right hip by Dr. Junius Roads.  Her plain films show well-maintained joint space.  This was the next obvious treatment modality after failing rest, activity modification, anti-inflammatories and hip strengthening exercises.  Her pain is only in the groin and it hurts with internal/external rotated and pivoting activities of the right hip.  She does state that the injection helped her greatly for about a week or more and now her pain is back in the groin again.  On examination of her left hip her left hip is normal.  Examination of her right hip shows fluid range of motion but significant pain in the groin with a catching sensation throughout internal and external rotation.  At this point I am concerned about a labral tear of the right hip.  Based on the failure of conservative treatment and her clinical exam findings a MRI arthrogram is now warranted to the right hip to rule out a labral tear as well as assess the cartilage in her hip in general.  She agrees with this treatment plan.  We will see her back once we have the studies.  All questions and concerns were answered and addressed.

## 2019-11-21 ENCOUNTER — Other Ambulatory Visit: Payer: Self-pay

## 2019-11-21 DIAGNOSIS — M25551 Pain in right hip: Secondary | ICD-10-CM

## 2019-12-04 DIAGNOSIS — F331 Major depressive disorder, recurrent, moderate: Secondary | ICD-10-CM | POA: Diagnosis not present

## 2019-12-11 ENCOUNTER — Ambulatory Visit
Admission: RE | Admit: 2019-12-11 | Discharge: 2019-12-11 | Disposition: A | Discharge status: Home / Self Care | Payer: Medicare PPO | Source: Ambulatory Visit | Attending: Orthopaedic Surgery | Admitting: Orthopaedic Surgery

## 2019-12-11 ENCOUNTER — Other Ambulatory Visit: Payer: Self-pay

## 2019-12-11 DIAGNOSIS — M7061 Trochanteric bursitis, right hip: Secondary | ICD-10-CM | POA: Diagnosis not present

## 2019-12-11 DIAGNOSIS — M25551 Pain in right hip: Secondary | ICD-10-CM

## 2019-12-11 MED ORDER — IOPAMIDOL (ISOVUE-M 200) INJECTION 41%
20.0000 mL | Freq: Once | INTRAMUSCULAR | Status: AC
  Administered 2019-12-11: 20 mL via INTRA_ARTICULAR

## 2019-12-18 DIAGNOSIS — F331 Major depressive disorder, recurrent, moderate: Secondary | ICD-10-CM | POA: Diagnosis not present

## 2019-12-23 ENCOUNTER — Ambulatory Visit: Payer: Medicare PPO | Admitting: Cardiology

## 2019-12-23 ENCOUNTER — Encounter: Payer: Self-pay | Admitting: Cardiology

## 2019-12-23 ENCOUNTER — Other Ambulatory Visit: Payer: Self-pay

## 2019-12-23 VITALS — BP 123/68 | HR 74 | Resp 16 | Ht 66.5 in | Wt 171.4 lb

## 2019-12-23 DIAGNOSIS — R42 Dizziness and giddiness: Secondary | ICD-10-CM

## 2019-12-23 DIAGNOSIS — E7800 Pure hypercholesterolemia, unspecified: Secondary | ICD-10-CM | POA: Diagnosis not present

## 2019-12-23 DIAGNOSIS — I10 Essential (primary) hypertension: Secondary | ICD-10-CM

## 2019-12-23 DIAGNOSIS — I447 Left bundle-branch block, unspecified: Secondary | ICD-10-CM | POA: Diagnosis not present

## 2019-12-23 DIAGNOSIS — I428 Other cardiomyopathies: Secondary | ICD-10-CM

## 2019-12-23 MED ORDER — EZETIMIBE-SIMVASTATIN 10-20 MG PO TABS: 1.0000 | ORAL_TABLET | Freq: Every day | ORAL | 3 refills | Status: DC

## 2019-12-23 NOTE — Progress Notes (Signed)
Primary Physician/Referring:  Rita Ohara, MD  Patient ID: Lindsey Rodriguez, female    DOB: 06/19/1966, 54 y.o.   MRN: [id number]  Chief Complaint  Patient presents with  . Cardiac function  . Fatigue  . Shortness of Breath  . Dizziness  . Follow-up   HPI:    Lindsey Rodriguez  is a 54 y.o.  Caucasian female, in September 2017 while going through lithotripsy, found to have frequent PVCs and on office visit was found to have new onset left bundle branch block and an echo revealing EF 30%. Nuclear stress test was negative for ischemia, EF 33%. She is now being treated for nonischemic cardiomyopathy, ejection fraction has improved to 45 to 50% by echocardiogram in 2020. She has hypertension and hyperlipidemia.  She has also been diagnosed with anxiety and depression, panic attacks and also fibromyalgia.  She has occasional episodes of marked dizziness and upon presentation to the office today said she felt like she was going to pass out especially when she suddenly stands up.  She also states that the death anniversary of her father is coming up soon and thinks that she will also have a massive heart attack and die the same day.  She continues to have marked fatigue, marked decreased exercise tolerance and dyspnea on exertion for routine activities.  Denies PND or orthopnea.  Past Medical History:  Diagnosis Date  . Anemia   . Anxiety panic attacks   [name]  . Arthritis    knees  . Cardiomyopathy, nonischemic ([place]) 04/07/2016   EF left ventricule calculated on Lexiscan stress test at 33% with global hypokinesis.   . Chronic back pain   . Chronic neck pain   . Depression   . Dysrhythmia    pvc's with cardiomyopathy  . Family history of adverse reaction to anesthesia    mother has problems waking up  . Fibromyalgia    and neuropathy of feet  . History of kidney stones    lithotripsy  03/2016  . Hyperlipidemia   . Hypertension   . Kidney stone    History  . Kidney stones  h/o  . Melanoma ([place]) RUE   [name]  . MGUS (monoclonal gammopathy of unknown significance) 06/2015   no treatment just watching recheck blood work 12/2015  . Migraine    OTC med prn - stress related  . Plantar fasciitis right  . Renal stones 2008/2011/2017  . Seasonal allergies   . SVD (spontaneous vaginal delivery)    x 3  . Tendonitis of foot right   Past Surgical History:  Procedure Laterality Date  . ABDOMINAL HYSTERECTOMY    . ABLATION ON ENDOMETRIOSIS N/A 11/24/2015   Procedure: ABLATION ON ENDOMETRIOSIS;  Surgeon: [name], MD;  Location: [facility];  Service: Gynecology;  Laterality: N/A;  . ANTERIOR LAT LUMBAR FUSION Right 06/15/2016   Procedure: RIGHT LUMBAR THREE-FOUR ANTEROLATERAL LUMBAR INTERBODY FUSION WITH LATERAL PLATE;  Surgeon: Erline Levine, MD;  Location: [facility];  Service: Neurosurgery;  Laterality: Right;  . BACK SURGERY  Dr.Stern, last sx Summer 2011   X 4  (L3-S1)  . BACK SURGERY  06/2016  . BLADDER SUSPENSION N/A 11/24/2015   Procedure: TRANSVAGINAL TAPE (TVT) PROCEDURE;  Surgeon: [name], MD;  Location: [facility];  Service: Gynecology;  Laterality: N/A;  . COLONOSCOPY    . CYSTOCELE REPAIR  11/24/2015   Procedure: ANTERIOR REPAIR (CYSTOCELE);  Surgeon: [name], MD;  Location: [facility];  Service: Gynecology;;  .  CYSTOSCOPY N/A 11/24/2015   Procedure: CYSTOSCOPY;  Surgeon: [name], MD;  Location: [facility];  Service: Gynecology;  Laterality: N/A;  . DILATION AND CURETTAGE OF UTERUS    . ENDOMETRIAL ABLATION  08/2000  . EYE SURGERY Bilateral    Lasik   . KIDNEY STONE SURGERY  2011  . LITHOTRIPSY  08/2017 (L), 03/2018 (R)  . MELANOMA EXCISION  05/2015   right forearm; [name]  . NECK SURGERY  [name]  Summer 2011   X 4  (C4-T1 Level)  . RHINOPLASTY    . ROBOTIC ASSISTED BILATERAL SALPINGO OOPHERECTOMY Bilateral 11/24/2015   Procedure: ROBOTIC ASSISTED BILATERAL SALPINGO OOPHORECTOMY;  Surgeon: [name], MD;  Location: [facility];   Service: Gynecology;  Laterality: Bilateral;--OVARIES NOT REMOVED PER PATH REPORT  . ROBOTIC ASSISTED TOTAL HYSTERECTOMY WITH SALPINGECTOMY Bilateral 11/24/2015   Procedure: ROBOTIC ASSISTED TOTAL HYSTERECTOMY WITH SALPINGECTOMY;  Surgeon: [name], MD;  Location: [facility];  Service: Gynecology;  Laterality: Bilateral;  . TONSILLECTOMY    . WISDOM TOOTH EXTRACTION     Social History   Tobacco Use  . Smoking status: Former Smoker    Packs/day: 0.10    Years: 3.00    Pack years: 0.30    Types: Cigarettes    Quit date: 07/04/1987    Years since quitting: 32.4  . Smokeless tobacco: Never Used  Substance Use Topics  . Alcohol use: Yes    Alcohol/week: 0.0 standard drinks    Comment: rarely    Marital Status: Married   ROS  Review of Systems  Constitutional: Positive for malaise/fatigue.  Cardiovascular: Positive for dyspnea on exertion.  Musculoskeletal: Positive for joint pain, muscle weakness and myalgias.  Neurological: Positive for dizziness.  Psychiatric/Behavioral: Positive for depression. The patient is nervous/anxious.    Objective   Vitals with BMI 12/23/2019 10/30/2019 10/23/2019  Height 5' 6.5" 5' 6.75" 5' 6.75"  Weight 171 lbs 6 oz 170 lbs 170 lbs 10 oz  BMI 27.25 53.97 67.34  Systolic 193 - 790  Diastolic 68 - 67  Pulse 74 - 63    Orthostatic VS for the past 72 hrs (Last 3 readings):  Orthostatic BP Patient Position BP Location Cuff Size Orthostatic Pulse  12/23/19 1333 101/68 Sitting Left Arm Large 70  12/23/19 1332 103/66 Sitting Left Arm Large 57  12/23/19 1331 113/63 Supine Left Arm Large 59      Physical Exam Constitutional:      Appearance: She is well-developed and normal weight.  Cardiovascular:     Rate and Rhythm: Normal rate and regular rhythm.     Pulses: Intact distal pulses.     Heart sounds: Normal heart sounds. No murmur heard.  No gallop.      Comments: No leg edema, no JVD. Pulmonary:     Effort: Pulmonary effort is normal.      Breath sounds: Normal breath sounds.  Abdominal:     General: Bowel sounds are normal.     Palpations: Abdomen is soft.  Neurological:     Mental Status: She is alert.    Laboratory examination:   03/13/2018: LDL particle #1742. LDL 140. HDL 71, triglycerides 93, cholesterol 230. Small LDL-P 333. LDL size 21.4. LP-IR score 51. NHDL 159.  Recent Labs    06/23/19 0807  NA 138  K 4.6  CL 101  CO2 29  GLUCOSE 90  BUN 13  CREATININE 0.85  CALCIUM 9.8  GFRNONAA >60  GFRAA >60   CrCl cannot be calculated (Patient's most  recent lab result is older than the maximum 21 days allowed.).  CMP Latest Ref Rng & Units 06/23/2019 12/18/2018 06/12/2018  Glucose 70 - 99 mg/dL 90 88 79  BUN 6 - 20 mg/dL 13 20 13   Creatinine 0.44 - 1.00 mg/dL 0.85 0.88 0.84  Sodium 135 - 145 mmol/L 138 137 137  Potassium 3.5 - 5.1 mmol/L 4.6 4.5 5.1  Chloride 98 - 111 mmol/L 101 104 104  CO2 22 - 32 mmol/L 29 24 24   Calcium 8.9 - 10.3 mg/dL 9.8 9.4 9.9  Total Protein 6.5 - 8.1 g/dL 8.1 7.9 8.5(H)  Total Bilirubin 0.3 - 1.2 mg/dL 0.5 0.5 0.6  Alkaline Phos 38 - 126 U/L 61 69 84  AST 15 - 41 U/L 24 19 21   ALT 0 - 44 U/L 31 22 19    CBC Latest Ref Rng & Units 06/23/2019 12/18/2018 06/12/2018  WBC 4.0 - 10.5 K/uL 5.6 4.9 5.5  Hemoglobin 12.0 - 15.0 g/dL 13.3 13.3 14.0  Hematocrit 36 - 46 % 39.3 39.7 42.1  Platelets 150 - 400 K/uL 259 258 303   Lipid Panel     Component Value Date/Time   CHOL 295 (H) 06/23/2019 0807   CHOL 274 (H) 03/12/2019 0831   TRIG 140 06/23/2019 0807   HDL 70 06/23/2019 0807   HDL 76 03/12/2019 0831   CHOLHDL 4.2 06/23/2019 0807   VLDL 28 06/23/2019 0807   LDLCALC 197 (H) 06/23/2019 0807   LDLCALC 177 (H) 03/12/2019 0831   LDLCALC 144 (H) 03/08/2017 0849   HEMOGLOBIN A1C Lab Results  Component Value Date   HGBA1C 5.5 01/27/2013   TSH Recent Labs    03/19/19 1116  TSH 1.060   Medications and allergies   Allergies  Allergen Reactions  . Penicillins Shortness Of  Breath and Rash    Has patient had a PCN reaction causing immediate rash, facial/tongue/throat swelling, SOB or lightheadedness with hypotension: no Has patient had a PCN reaction causing severe rash involving mucus membranes or skin necrosis: no Has patient had a PCN reaction that required hospitalization no Has patient had a PCN reaction occurring within the last 10 years: no If all of the above answers are "NO", then may proceed with Cephalosporin use.   . Codeine Other (See Comments)    Manic depressive  . Voltaren [Diclofenac] Nausea And Vomiting and Other (See Comments)    Pt stated "tore stomach up"  . Erythromycin Rash     Current Outpatient Medications  Medication Instructions  . ARIPiprazole (ABILIFY) 2 mg, Oral, Daily  . carvedilol (COREG) 6.25 MG tablet TAKE 1 TABLET BY MOUTH TWICE A DAY WITH A MEAL  . ezetimibe-simvastatin (VYTORIN) 10-20 MG tablet 1 tablet, Oral, Daily-1800  . FLUoxetine (PROZAC) 80 mg, Oral, Daily  . lisinopril (ZESTRIL) 10 mg, Oral, Every evening  . LORazepam (ATIVAN) 0.5-1 mg, Oral, 3 times daily PRN, Prn anxiety  . MISC NATURAL PRODUCT OP 2 capsules, Oral, Daily, Omega-Q   . MISC NATURAL PRODUCTS PO 2 capsules, Oral, Daily, Rejuvenixx   . MISC NATURAL PRODUCTS PO 1 capsule, Oral, 2 times daily, Optimal- V  . MISC NATURAL PRODUCTS PO 1 capsule, Oral, 2 times daily, Optimal-M  . MISC NATURAL PRODUCTS PO 1 capsule, Oral, 2 times daily, MagnaCal-D   Radiology:  No results found. Cardiac Studies:   Lexiscan myoview stress test 04/07/2016: 1. Resting EKG demonstrates normal sinus rhythm with frequent PVCs in the pattern of ventricular bigeminy.  Low-voltage complexes.  PVCs decreased in  the recovery phase of Lexiscan stress test. 2. Left ventricular cavity is noted to be mildly dilated with LV end diastolic volume of 062 mL on the rest and stress studies. Marland Kitchen SPECT images demonstrate homogeneous tracer distribution throughout the myocardium.  The left  ventricular ejection fraction was calculated or visually estimated to be 33% with global hypokinesis. Findings are consistent with nonischemic cardiomyopathy.  Holter monitor 06/06/2017: Sinus rhythm.  Minimum heart rate 54 bpm, average heart rate 71 bpm, maximum heart rate 102 bpm.  Total ventricular ectopy 3.3%.  Less than 1% supraventricular ectopy  No atrial fibrillation, atrial flutter, ventricular tachycardia.  Echocardiogram 06/13/2019:  Left ventricle cavity is normal in size and thickness. Mild global hypokinesis with mildly depressed LV systolic function with visual EF  45-50%. Frequent PVC's seen. Doppler evidence of grade I (impaired) diastolic dysfunction, normal LAP.  Mild to moderate mitral regurgitation.  Mild tricuspid regurgitation.  No evidence of pulmonary hypertension.  Compared to previous study in 2018, LVEF is reduced from 55% to 45-50%.  Mitral and tricuspid regurgitation more prominent.  EKG:   EKG 12/23/2019: Sinus bradycardia at rate of 54 bpm, left bundle branch block.  No further analysis.  Assessment     ICD-10-CM   1. Non-ischemic cardiomyopathy (HCC)  I42.8 EKG 12-Lead    PCV ECHOCARDIOGRAM COMPLETE  2. LBBB (left bundle branch block)  I44.7   3. Essential hypertension, benign  I10 CMP14+EGFR    TSH  4. Hypercholesteremia  E78.00 ezetimibe-simvastatin (VYTORIN) 10-20 MG tablet    Lipid Panel With LDL/HDL Ratio  5. Dizziness  R42    Recommendations:   Meds ordered this encounter  Medications  . ezetimibe-simvastatin (VYTORIN) 10-20 MG tablet    Sig: Take 1 tablet by mouth daily at 6 PM.    Dispense:  30 tablet    Refill:  3    ALANI SABBAGH  is a 54 y.o. Caucasian female, in September 2017 while going through lithotripsy, found to have frequent PVCs and on office visit was found to have new onset left bundle branch block and an echo revealing EF 30%. Nuclear stress test was negative for ischemia, EF 33%. She is now being treated for  nonischemic cardiomyopathy, ejection fraction has improved to 45 to 50% by echocardiogram in 2020. She has hypertension and hyperlipidemia.  She has also been diagnosed with anxiety and depression, panic attacks and also fibromyalgia.  I reviewed her labs, LDL has constantly been increasing significantly.  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  On her last office visit I had started her on Crestor but she did not refill the prescription stating that her mother also had severe allergies to statins, on further questioning she had severe headaches with statins.  After discussions, patient is willing to try statins, in view of statin hesitancy, I started her on Vytorin 20/10 mg 1 p.o. every afternoon which for insurance reasons can be split if needed.  She has developed marked fatigue, decreased exercise tolerance and dyspnea on exertion.  Although there is no clinical evidence of heart failure, would like to repeat echocardiogram.  She feels impending doom as her father's death anniversary is coming close, thinks that she is 1 to have a massive heart attack.  I have tried to reassure her.  Today her EKG does not reveal any significant PVCs, has chronic LBBB.  I do not hear any gallop or murmur.  Hopefully LVEF is remained stable.  She has not been able to tolerate high-dose beta-blockers or ACE inhibitors, as she is well compensated, continue present medical therapy and have like to see her back in 2 months for follow-up. Will check lipids prior to OV.   Adrian Prows, MD, [facility] 12/23/2019, 9:36 PM Office: [phone]

## 2019-12-24 ENCOUNTER — Telehealth: Payer: Self-pay | Admitting: Family Medicine

## 2019-12-24 ENCOUNTER — Encounter: Payer: Self-pay | Admitting: Family Medicine

## 2019-12-24 DIAGNOSIS — E782 Mixed hyperlipidemia: Secondary | ICD-10-CM

## 2019-12-24 DIAGNOSIS — E785 Hyperlipidemia, unspecified: Secondary | ICD-10-CM | POA: Insufficient documentation

## 2019-12-24 NOTE — Telephone Encounter (Signed)
Ok to order a lipid panel for her.

## 2019-12-24 NOTE — Telephone Encounter (Signed)
Pt called and wants her cholesterol checked, she states she know it has come down since January and her cardiologist is wanting her to start a new medication based on January numbers.  Please advise if ok for her to have labs?  Pt ph [phone]   (Dr Tomi Bamberger patient)

## 2019-12-24 NOTE — Telephone Encounter (Signed)
Left message for pt to call me back 

## 2019-12-25 ENCOUNTER — Ambulatory Visit: Payer: Medicare PPO | Admitting: Orthopaedic Surgery

## 2019-12-25 ENCOUNTER — Other Ambulatory Visit: Payer: Self-pay

## 2019-12-25 ENCOUNTER — Encounter: Payer: Self-pay | Admitting: Orthopaedic Surgery

## 2019-12-25 DIAGNOSIS — M25551 Pain in right hip: Secondary | ICD-10-CM | POA: Diagnosis not present

## 2019-12-25 MED ORDER — LIDOCAINE HCL 1 % IJ SOLN
3.0000 mL | INTRAMUSCULAR | Status: AC | PRN
  Administered 2019-12-25: 3 mL

## 2019-12-25 MED ORDER — METHYLPREDNISOLONE ACETATE 40 MG/ML IJ SUSP
40.0000 mg | INTRAMUSCULAR | Status: AC | PRN
  Administered 2019-12-25: 40 mg via INTRA_ARTICULAR

## 2019-12-25 NOTE — Telephone Encounter (Signed)
Pt come in tomorrow

## 2019-12-25 NOTE — Progress Notes (Signed)
Office Visit Note   Patient: Lindsey Rodriguez           Date of Birth: 1965/08/20           MRN: [id number] Visit Date: 12/25/2019              Requested by: Rita Ohara, [address],  [place] [id number] PCP: Rita Ohara, MD   Assessment & Plan: Visit Diagnoses:  1. Pain in right hip     Plan: I spoke with her about trying to stay as conservative as possible with his hip.  I offered her 1 more steroid injection and she agrees with this as well.  She did get some sense of relief given the fact the cartilage looks so good in her hip.  I told her this is not surgery that I have performed in terms of repairing the tendon to this area and sometimes this is a chronic issue.  There is no muscle atrophy on the MRI and certainly we can see her in follow-up or even make a referral to sports medicine orthopedic surgeon who may consider surgery in this area.  She wants to stay as conservative as possible and I agree with this as well.  Follow-Up Instructions: Return if symptoms worsen or fail to improve.   Orders:  Orders Placed This Encounter  Procedures  . Large Joint Inj   No orders of the defined types were placed in this encounter.     Procedures: Large Joint Inj: R greater trochanter on 12/25/2019 2:54 PM Indications: pain and diagnostic evaluation Details: 22 G 1.5 in needle, lateral approach  Arthrogram: No  Medications: 3 mL lidocaine 1 %; 40 mg methylPREDNISolone acetate 40 MG/ML Outcome: tolerated well, no immediate complications Procedure, treatment alternatives, risks and benefits explained, specific risks discussed. Consent was given by the patient. Immediately prior to procedure a time out was called to verify the correct patient, procedure, equipment, support staff and site/side marked as required. Patient was prepped and draped in the usual sterile fashion.       Clinical Data: No additional findings.   Subjective: Chief Complaint  Patient  presents with  . Right Hip - Follow-up  Patient comes in today for follow-up after having an MRI of her right hip.  We are concerned about assessing the cartilage of her hip because a lot of pain in the groin and a burning type of pain.  She is still having that type of pain.  Small lateral and superior lateral aspect of the hip itself.  She has had 1 steroid injection.  HPI  Review of Systems She currently denies any headache, chest pain, shortness of breath, fever, chills, nausea, vomiting  Objective: Vital Signs: LMP 12/16/2012   Physical Exam She is alert and orient x3 and in no acute distress Ortho Exam Examination of the right hip shows significant pain over the trochanteric area with full range of motion of the hip.  There is no blocks to rotation. Specialty Comments:  No specialty comments available. The MRI shows preserved cartilage in her right hip.  There is significant tendinosis of the gluteus medius and minimus tendons and there is at least a partial tear of the portion of the gluteus medius tendon with about a centimeter of retraction.  There is some degenerative flaring of the labrum but no gross labral tear. Imaging: No results found.   PMFS History: Patient Active Problem List   Diagnosis Date Noted  . Hyperlipidemia   .  Irregular periods 12/25/2018  . Menopausal symptom 12/25/2018  . Occult blood in stools 12/25/2018  . Lumbar scoliosis 06/15/2016  . Chondrocalcinosis 05/15/2016  . Primary osteoarthritis of both knees 05/15/2016  . Tendinopathy of right shoulder 05/15/2016  . Spondylosis of lumbar region 05/15/2016  . Plantar fasciitis, bilateral 05/15/2016  . Renal calcinosis 05/15/2016  . Malignant melanoma of right upper extremity including shoulder ([place]) 05/15/2016  . Nonischemic cardiomyopathy ([place]) 04/10/2016  . Post op infection 12/18/2015  . Postoperative state 11/24/2015  . Chronic migraine without aura without status migrainosus, not intractable  08/04/2015  . Fibromyalgia 07/26/2015  . MGUS (monoclonal gammopathy of unknown significance) 06/10/2015  . DJD (degenerative joint disease), cervical 03/02/2015  . Migraine headache 09/05/2012  . Fatigue 10/26/2011  . Pure hyperglyceridemia 04/27/2011  . Essential hypertension, benign 04/17/2011  . Vitamin D deficiency 04/17/2011  . Neuropathy 12/22/2010  . Headache 12/22/2010   Past Medical History:  Diagnosis Date  . Anemia   . Anxiety panic attacks   [name]  . Arthritis    knees  . Cardiomyopathy, nonischemic ([place]) 04/07/2016   EF left ventricule calculated on Lexiscan stress test at 33% with global hypokinesis.   . Chronic back pain   . Chronic neck pain   . Depression   . Dysrhythmia    pvc's with cardiomyopathy  . Family history of adverse reaction to anesthesia    mother has problems waking up  . Fibromyalgia    and neuropathy of feet  . History of kidney stones    lithotripsy  03/2016  . Hyperlipidemia   . Hypertension   . Kidney stone    History  . Kidney stones h/o  . Melanoma ([place]) RUE   [name]  . MGUS (monoclonal gammopathy of unknown significance) 06/2015   no treatment just watching recheck blood work 12/2015  . Migraine    OTC med prn - stress related  . Plantar fasciitis right  . Renal stones 2008/2011/2017  . Seasonal allergies   . SVD (spontaneous vaginal delivery)    x 3  . Tendonitis of foot right    Family History  Problem Relation Age of Onset  . Hypertension Mother   . Depression Mother   . Migraines Mother   . Hypothyroidism Mother   . COPD Father   . Heart failure Father 46  . Healthy Brother   . Healthy Daughter   . Healthy Son   . Healthy Daughter   . Diabetes Neg Hx     Past Surgical History:  Procedure Laterality Date  . ABDOMINAL HYSTERECTOMY    . ABLATION ON ENDOMETRIOSIS N/A 11/24/2015   Procedure: ABLATION ON ENDOMETRIOSIS;  Surgeon: [name], MD;  Location: [facility];  Service: Gynecology;  Laterality: N/A;    . ANTERIOR LAT LUMBAR FUSION Right 06/15/2016   Procedure: RIGHT LUMBAR THREE-FOUR ANTEROLATERAL LUMBAR INTERBODY FUSION WITH LATERAL PLATE;  Surgeon: Erline Levine, MD;  Location: [facility];  Service: Neurosurgery;  Laterality: Right;  . BACK SURGERY  Dr.Stern, last sx Summer 2011   X 4  (L3-S1)  . BACK SURGERY  06/2016  . BLADDER SUSPENSION N/A 11/24/2015   Procedure: TRANSVAGINAL TAPE (TVT) PROCEDURE;  Surgeon: [name], MD;  Location: [facility];  Service: Gynecology;  Laterality: N/A;  . COLONOSCOPY    . CYSTOCELE REPAIR  11/24/2015   Procedure: ANTERIOR REPAIR (CYSTOCELE);  Surgeon: [name], MD;  Location: [facility];  Service: Gynecology;;  . Consuela Mimes N/A 11/24/2015   Procedure: CYSTOSCOPY;  Surgeon: [name], MD;  Location: [facility];  Service: Gynecology;  Laterality: N/A;  . DILATION AND CURETTAGE OF UTERUS    . ENDOMETRIAL ABLATION  08/2000  . EYE SURGERY Bilateral    Lasik   . KIDNEY STONE SURGERY  2011  . LITHOTRIPSY  08/2017 (L), 03/2018 (R)  . MELANOMA EXCISION  05/2015   right forearm; [name]  . NECK SURGERY  [name]  Summer 2011   X 4  (C4-T1 Level)  . RHINOPLASTY    . ROBOTIC ASSISTED BILATERAL SALPINGO OOPHERECTOMY Bilateral 11/24/2015   Procedure: ROBOTIC ASSISTED BILATERAL SALPINGO OOPHORECTOMY;  Surgeon: [name], MD;  Location: [facility];  Service: Gynecology;  Laterality: Bilateral;--OVARIES NOT REMOVED PER PATH REPORT  . ROBOTIC ASSISTED TOTAL HYSTERECTOMY WITH SALPINGECTOMY Bilateral 11/24/2015   Procedure: ROBOTIC ASSISTED TOTAL HYSTERECTOMY WITH SALPINGECTOMY;  Surgeon: [name], MD;  Location: [facility];  Service: Gynecology;  Laterality: Bilateral;  . TONSILLECTOMY    . WISDOM TOOTH EXTRACTION     Social History   Occupational History  . Not on file  Tobacco Use  . Smoking status: Former Smoker    Packs/day: 0.10    Years: 3.00    Pack years: 0.30    Types: Cigarettes    Quit date: 07/04/1987    Years since quitting: 32.4  .  Smokeless tobacco: Never Used  Vaping Use  . Vaping Use: Never used  Substance and Sexual Activity  . Alcohol use: Yes    Alcohol/week: 0.0 standard drinks    Comment: rarely   . Drug use: No  . Sexual activity: Yes    Partners: Male    Birth control/protection: Other-see comments, Surgical    Comment: husband had vasectomy; pt had hysterectomy

## 2019-12-25 NOTE — Telephone Encounter (Signed)
Left message for pt to call me back 

## 2019-12-26 ENCOUNTER — Other Ambulatory Visit: Payer: Medicare PPO

## 2019-12-26 DIAGNOSIS — E782 Mixed hyperlipidemia: Secondary | ICD-10-CM | POA: Diagnosis not present

## 2019-12-26 LAB — LIPID PANEL
Chol/HDL Ratio: 3.7 ratio (ref 0.0–4.4)
Cholesterol, Total: 272 mg/dL — ABNORMAL HIGH (ref 100–199)
HDL: 74 mg/dL (ref >39)
LDL Chol Calc (NIH): 186 mg/dL — ABNORMAL HIGH (ref 0–99)
Triglycerides: 76 mg/dL (ref 0–149)
VLDL Cholesterol Cal: 12 mg/dL (ref 5–40)

## 2019-12-28 NOTE — Progress Notes (Signed)
She requested to come in for fasting lipids while you were away. I advised her to follow up.

## 2019-12-30 ENCOUNTER — Other Ambulatory Visit: Payer: Self-pay

## 2019-12-30 ENCOUNTER — Ambulatory Visit: Payer: Medicare PPO

## 2019-12-30 DIAGNOSIS — I428 Other cardiomyopathies: Secondary | ICD-10-CM | POA: Diagnosis not present

## 2020-01-05 NOTE — Progress Notes (Signed)
Very mild decrease in LV systolic function compared to 06/13/2019. No change in mod MR.

## 2020-01-06 ENCOUNTER — Other Ambulatory Visit: Payer: Medicare PPO

## 2020-01-07 DIAGNOSIS — F331 Major depressive disorder, recurrent, moderate: Secondary | ICD-10-CM | POA: Diagnosis not present

## 2020-01-13 ENCOUNTER — Ambulatory Visit: Payer: Medicare PPO | Admitting: Hematology

## 2020-01-15 ENCOUNTER — Other Ambulatory Visit: Payer: Self-pay

## 2020-01-15 ENCOUNTER — Inpatient Hospital Stay: Payer: Medicare PPO | Attending: Hematology

## 2020-01-15 DIAGNOSIS — D472 Monoclonal gammopathy: Secondary | ICD-10-CM | POA: Diagnosis not present

## 2020-01-15 LAB — CBC WITH DIFFERENTIAL/PLATELET
Abs Immature Granulocytes: 0.01 10*3/uL (ref 0.00–0.07)
Basophils Absolute: 0.1 10*3/uL (ref 0.0–0.1)
Basophils Relative: 1 %
Eosinophils Absolute: 0.1 10*3/uL (ref 0.0–0.5)
Eosinophils Relative: 2 %
HCT: 38.2 % (ref 36.0–46.0)
Hemoglobin: 13.1 g/dL (ref 12.0–15.0)
Immature Granulocytes: 0 %
Lymphocytes Relative: 37 %
Lymphs Abs: 1.9 10*3/uL (ref 0.7–4.0)
MCH: 33.8 pg (ref 26.0–34.0)
MCHC: 34.3 g/dL (ref 30.0–36.0)
MCV: 98.5 fL (ref 80.0–100.0)
Monocytes Absolute: 0.4 10*3/uL (ref 0.1–1.0)
Monocytes Relative: 7 %
Neutro Abs: 2.8 10*3/uL (ref 1.7–7.7)
Neutrophils Relative %: 53 %
Platelets: 226 10*3/uL (ref 150–400)
RBC: 3.88 MIL/uL (ref 3.87–5.11)
RDW: 12.5 % (ref 11.5–15.5)
WBC: 5.2 10*3/uL (ref 4.0–10.5)
nRBC: 0 % (ref 0.0–0.2)

## 2020-01-15 LAB — CMP (CANCER CENTER ONLY)
ALT: 35 U/L (ref 0–44)
AST: 28 U/L (ref 15–41)
Albumin: 4.2 g/dL (ref 3.5–5.0)
Alkaline Phosphatase: 53 U/L (ref 38–126)
Anion gap: 9 (ref 5–15)
BUN: 14 mg/dL (ref 6–20)
CO2: 27 mmol/L (ref 22–32)
Calcium: 9.7 mg/dL (ref 8.9–10.3)
Chloride: 102 mmol/L (ref 98–111)
Creatinine: 0.85 mg/dL (ref 0.44–1.00)
GFR, Est AFR Am: >60 mL/min (ref >60)
GFR, Estimated: >60 mL/min (ref >60)
Glucose, Bld: 82 mg/dL (ref 70–99)
Potassium: 4.5 mmol/L (ref 3.5–5.1)
Sodium: 138 mmol/L (ref 135–145)
Total Bilirubin: 0.7 mg/dL (ref 0.3–1.2)
Total Protein: 7.9 g/dL (ref 6.5–8.1)

## 2020-01-19 DIAGNOSIS — F331 Major depressive disorder, recurrent, moderate: Secondary | ICD-10-CM | POA: Diagnosis not present

## 2020-01-19 LAB — MULTIPLE MYELOMA PANEL, SERUM
Albumin SerPl Elph-Mcnc: 4 g/dL (ref 2.9–4.4)
Albumin/Glob SerPl: 1.3 (ref 0.7–1.7)
Alpha 1: 0.1 g/dL (ref 0.0–0.4)
Alpha2 Glob SerPl Elph-Mcnc: 0.7 g/dL (ref 0.4–1.0)
B-Globulin SerPl Elph-Mcnc: 1 g/dL (ref 0.7–1.3)
Gamma Glob SerPl Elph-Mcnc: 1.4 g/dL (ref 0.4–1.8)
Globulin, Total: 3.2 g/dL (ref 2.2–3.9)
IgA: 63 mg/dL — ABNORMAL LOW (ref 87–352)
IgG (Immunoglobin G), Serum: 1368 mg/dL (ref 586–1602)
IgM (Immunoglobulin M), Srm: 58 mg/dL (ref 26–217)
M Protein SerPl Elph-Mcnc: 1 g/dL — ABNORMAL HIGH
Total Protein ELP: 7.2 g/dL (ref 6.0–8.5)

## 2020-01-21 NOTE — Progress Notes (Signed)
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 Skeletal survey negative. Bone marrow deferred as per patient choice.  Current Treatment: observation   INTERVAL HISTORY:  Lindsey Rodriguez is here for her scheduled 6 month follow-up for IgG lambda MGUS. The patient's last visit with [place] was on 07/08/19. The pt reports that she is doing well overall.  The pt reports she is good. Pt has been having problems with back and right hip. She has been put on statins by her cardiologist and it is causing leg pains. She is not sleeping well because of the leg pain. Pt has been more fatigued recently. She has not had her COVID19 vaccines.   Lab results today (01/15/20) of CBC w/diff and CMP is as follows: all values are WNL  01/15/20 of MMP is as follows: all values are WNL except for IgA at 63, M Protein SerPl Elph-Mcnc at 1   On review of systems, pt reports fatigue, leg pain, back pain, right hip pain, not sleeping well, steady weight and denies abdominal pain, irregular bowl movements, urination issues and any other symptoms.   REVIEW OF SYSTEMS:   A 10+ POINT REVIEW OF SYSTEMS WAS OBTAINED including neurology, dermatology, psychiatry, cardiac, respiratory, lymph, extremities, GI, GU, Musculoskeletal, constitutional, breasts, reproductive, HEENT.  All pertinent positives are noted in the HPI.  All others are negative.   Past Medical History:  Diagnosis Date   Anemia    Anxiety panic attacks   [name]   Arthritis    knees   Cardiomyopathy, nonischemic ([place]) 04/07/2016   EF left ventricule calculated on Lexiscan stress test at 33% with global hypokinesis.     Chronic back pain    Chronic neck pain    Depression    Dysrhythmia    pvc's with cardiomyopathy   Family history of adverse reaction to anesthesia    mother has problems waking up   Fibromyalgia    and neuropathy of feet   History of kidney stones    lithotripsy  03/2016   Hyperlipidemia    Hypertension    Kidney stone    History   Kidney stones h/o   Melanoma ([place]) RUE   [name]   MGUS (monoclonal gammopathy of unknown significance) 06/2015   no treatment just watching recheck blood work 12/2015   Migraine    OTC med prn - stress related   Plantar fasciitis right   Renal stones 2008/2011/2017   Seasonal allergies    SVD (spontaneous vaginal delivery)    x 3   Tendonitis of foot right    Past Surgical History:  Procedure Laterality Date   ABDOMINAL HYSTERECTOMY     ABLATION ON ENDOMETRIOSIS N/A 11/24/2015   Procedure: ABLATION ON ENDOMETRIOSIS;  Surgeon: [name], MD;  Location: [facility];  Service: Gynecology;  Laterality: N/A;   ANTERIOR LAT LUMBAR FUSION Right 06/15/2016   Procedure: RIGHT LUMBAR THREE-FOUR ANTEROLATERAL LUMBAR INTERBODY FUSION WITH LATERAL PLATE;  Surgeon: Erline Levine, MD;  Location: [facility];  Service: Neurosurgery;  Laterality: Right;   BACK SURGERY  Dr.Stern, last sx Summer 2011   X 4  (L3-S1)   BACK SURGERY  06/2016   BLADDER SUSPENSION N/A 11/24/2015  Procedure: TRANSVAGINAL TAPE (TVT) PROCEDURE;  Surgeon: [name], MD;  Location: [facility];  Service: Gynecology;  Laterality: N/A;   COLONOSCOPY     CYSTOCELE REPAIR  11/24/2015   Procedure: ANTERIOR REPAIR (CYSTOCELE);  Surgeon: [name], MD;  Location: [facility];  Service: Gynecology;;   CYSTOSCOPY N/A 11/24/2015   Procedure: Consuela Mimes;  Surgeon: [name], MD;  Location: [facility];  Service: Gynecology;  Laterality: N/A;   DILATION AND CURETTAGE OF UTERUS     ENDOMETRIAL ABLATION  08/2000   EYE SURGERY Bilateral    Lasik    KIDNEY STONE SURGERY   2011   LITHOTRIPSY  08/2017 (L), 03/2018 (R)   MELANOMA EXCISION  05/2015   right forearm; [name]   NECK SURGERY  [name]  Summer 2011   X 4  (C4-T1 Level)   RHINOPLASTY     ROBOTIC ASSISTED BILATERAL SALPINGO OOPHERECTOMY Bilateral 11/24/2015   Procedure: ROBOTIC ASSISTED BILATERAL SALPINGO OOPHORECTOMY;  Surgeon: [name], MD;  Location: [facility];  Service: Gynecology;  Laterality: Bilateral;--OVARIES NOT REMOVED PER PATH REPORT   ROBOTIC ASSISTED TOTAL HYSTERECTOMY WITH SALPINGECTOMY Bilateral 11/24/2015   Procedure: ROBOTIC ASSISTED TOTAL HYSTERECTOMY WITH SALPINGECTOMY;  Surgeon: [name], MD;  Location: [facility];  Service: Gynecology;  Laterality: Bilateral;   TONSILLECTOMY     WISDOM TOOTH EXTRACTION      Social History   Tobacco Use   Smoking status: Former Smoker    Packs/day: 0.10    Years: 3.00    Pack years: 0.30    Types: Cigarettes    Quit date: 07/04/1987    Years since quitting: 32.5   Smokeless tobacco: Never Used  Vaping Use   Vaping Use: Never used  Substance Use Topics   Alcohol use: Yes    Alcohol/week: 0.0 standard drinks    Comment: rarely    Drug use: No   ALLERGIES:  is allergic to penicillins, codeine, voltaren [diclofenac], and erythromycin.  MEDICATIONS:  Current Outpatient Medications  Medication Sig Dispense Refill   ARIPiprazole (ABILIFY) 2 MG tablet Take 2 mg by mouth daily.     carvedilol (COREG) 6.25 MG tablet TAKE 1 TABLET BY MOUTH TWICE A DAY WITH A MEAL 180 tablet 3   ezetimibe-simvastatin (VYTORIN) 10-20 MG tablet Take 1 tablet by mouth daily at 6 PM. 30 tablet 3   FLUoxetine (PROZAC) 40 MG capsule Take 80 mg by mouth daily.     lisinopril (ZESTRIL) 20 MG tablet Take 0.5 tablets (10 mg total) by mouth every evening. 90 tablet 1   LORazepam (ATIVAN) 0.5 MG tablet Take 1-2 tablets (0.5-1 mg total) by mouth 3 (three) times daily as needed for anxiety. Prn anxiety 30 tablet 0   MISC NATURAL PRODUCT OP Take 2  capsules by mouth daily. Omega-Q     MISC NATURAL PRODUCTS PO Take 2 capsules by mouth daily. Rejuvenixx      MISC NATURAL PRODUCTS PO Take 1 capsule by mouth 2 (two) times daily. Optimal- V     MISC NATURAL PRODUCTS PO Take 1 capsule by mouth 2 (two) times daily. Optimal-M     MISC NATURAL PRODUCTS PO Take 1 capsule by mouth 2 (two) times daily. MagnaCal-D     No current facility-administered medications for this visit.    PHYSICAL EXAMINATION: ECOG PERFORMANCE STATUS: 2  Vitals:   01/22/20 0930  BP: (!) 96/52  Pulse: 63  Resp: 18  Temp: 97.7 F (36.5 C)  SpO2: 100%    Filed Weights  01/22/20 0930  Weight: 169 lb 4.8 oz (76.8 kg)   .Body mass index is 26.92 kg/m.  GENERAL:alert, in no acute distress and comfortable SKIN: no acute rashes, no significant lesions EYES: conjunctiva are pink and non-injected, sclera anicteric OROPHARYNX: MMM, no exudates, no oropharyngeal erythema or ulceration NECK: supple, no JVD LYMPH:  no palpable lymphadenopathy in the cervical, axillary or inguinal regions LUNGS: clear to auscultation b/l with normal respiratory effort HEART: regular rate & rhythm ABDOMEN:  normoactive bowel sounds , non tender, not distended. Extremity: no pedal edema PSYCH: alert & oriented x 3 with fluent speech NEURO: no focal motor/sensory deficits  LABORATORY DATA:   I have reviewed the data as listed  . CBC Latest Ref Rng & Units 01/15/2020 06/23/2019 12/18/2018  WBC 4.0 - 10.5 K/uL 5.2 5.6 4.9  Hemoglobin 12.0 - 15.0 g/dL 13.1 13.3 13.3  Hematocrit 36 - 46 % 38.2 39.3 39.7  Platelets 150 - 400 K/uL 226 259 258    . CMP Latest Ref Rng & Units 01/15/2020 06/23/2019 12/18/2018  Glucose 70 - 99 mg/dL 82 90 88  BUN 6 - 20 mg/dL _0 Creatinine 0.44 - 1.00 mg/dL 0.85 0.85 0.88  Sodium 135 - 145 mmol/L 138 138 137  Potassium 3.5 - 5.1 mmol/L 4.5 4.6 4.5  Chloride 98 - 111 mmol/L 102 101 104  CO2 22 - 32 mmol/L _1 Calcium 8.9 - 10.3 mg/dL  9.7 9.8 9.4  Total Protein 6.5 - 8.1 g/dL 7.9 8.1 7.9  Total Bilirubin 0.3 - 1.2 mg/dL 0.7 0.5 0.5  Alkaline Phos 38 - 126 U/L 53 61 69  AST 15 - 41 U/L _2 ALT 0 - 44 U/L 35 31 22        RADIOGRAPHIC STUDIES: I have personally reviewed the radiological images as listed and agreed with the findings in the report. PCV ECHOCARDIOGRAM COMPLETE  Result Date: 01/05/2020 Echocardiogram 12/30/2019: Left ventricle cavity is normal in size. Mild concentric hypertrophy of the left ventricle. Normal global wall motion. Moderate global hypokinesis. LVEF 40%. Doppler evidence of grade I (impaired) diastolic dysfunction, normal LAP. Calculated EF 40%. Moderate (Grade II) mitral regurgitation. Mild tricuspid regurgitation. No evidence of pulmonary hypertension. Compared to previous study on 06/13/2019, LVEF mildly reduced from 45-50% to about 40%.    ASSESSMENT & PLAN:   54 y.o. 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  Previous UPEP and skeletal survey negative. Patient has no evidence of anemia, renal failure on labs. No hypercalcemia. No new bone pain. SPEP shows stable M-spike at 1g/dl with no significant increase over the last couple of follow-ups. SFLC ratio within normal limits -12/21/18 MRI Cervical spine which did not reveal any overt indication/concern for bone tumors. Continue follow up with Orthopedics and neurology.  PLAN: -Discussed pt labwork today, 01/15/20; of CBC w/diff and CMP is as follows: all values are WNL  -Discussed 01/15/20 of MMP is as follows: all values are WNL except for IgA at 63, M Protein SerPl Elph-Mcnc at 1 -Advised on COVID19 vaccines   -Pt does not currently fit the CRAB criteria: no hypercalcemia, no renal insufficiency, no anemia, no identified bone lesions -The pt shows no clinical or lab progression of  her monoclonal paraproteinemia at this time.  -No indication for further treatment at this time.  -Recommend pt continue replacing Vitamin D,  Vitamin C and staying active  -Recommend pt f/u with Dr. Tomi Bamberger  -Recommend pt f/u with Cardiologist  -Will continue to monitor patient clinically with labs every 6 months at this time, and no overt indication to pursue a BM Bx -Will see the pt back in 6 months with labs 1 week prior   2) Systolic nonischemic CHF patient previously noted improvement in her EF from 33% to 45%. She subsequently noted her EF normalized to 55% as of 06/19/18 clinic visit.  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 Plan -Continue management per dermatology and ongoing skin surveillance. -continue sun protection.  4) Peripheral radiculopathy /neuropathy -Is currently followed by Vertell Limber of Neurosurgery as well as Dr Tomi Likens of Neurology who have begun to work this up further. -Most recent electromyography showed evidence of radiculopathy, mild in degree, and without evidence of sensorimotor polyneuropathy.   Cannot r/o small fiber neuropathy -  Evidence of this on epidermal nerve fiber density analysis. Plan -Mx per neurology  Continue followup with primary care physician, rheumatology, cardiology, and neurology    FOLLOW UP: RTC with Dr Irene Limbo with labs in 6 months. Plz schedule labs 1 week prior to clinic visit  The total time spent in the appt was 20 minutes and more than 50% was on counseling and direct patient cares.  All of the patient's questions were answered with apparent satisfaction. The patient knows to call the clinic with any problems, questions or concerns.  Lindsey Lone MD [place] [facility] Hematology/Oncology Physician [facility]  (Office):        [phone] (Work cell):  [phone] (Fax):           [phone]  I, Dawayne Cirri am acting as a scribe for Dr. Sullivan Rodriguez.   .I have reviewed the above documentation for accuracy and completeness, and I agree with the above. Lindsey Genera MD

## 2020-01-22 ENCOUNTER — Inpatient Hospital Stay: Payer: Medicare PPO | Admitting: Hematology

## 2020-01-22 ENCOUNTER — Other Ambulatory Visit: Payer: Self-pay

## 2020-01-22 VITALS — BP 96/52 | HR 63 | Temp 97.7°F | Resp 18 | Ht 66.5 in | Wt 169.3 lb

## 2020-01-22 DIAGNOSIS — D472 Monoclonal gammopathy: Secondary | ICD-10-CM

## 2020-01-23 NOTE — Telephone Encounter (Signed)
From patient.

## 2020-02-06 ENCOUNTER — Ambulatory Visit: Payer: Medicare PPO | Admitting: Cardiology

## 2020-02-11 ENCOUNTER — Other Ambulatory Visit: Payer: Self-pay | Admitting: Cardiology

## 2020-02-11 DIAGNOSIS — I10 Essential (primary) hypertension: Secondary | ICD-10-CM | POA: Diagnosis not present

## 2020-02-11 DIAGNOSIS — E7800 Pure hypercholesterolemia, unspecified: Secondary | ICD-10-CM | POA: Diagnosis not present

## 2020-02-12 LAB — COMPREHENSIVE METABOLIC PANEL
ALT: 27 IU/L (ref 0–32)
AST: 26 IU/L (ref 0–40)
Albumin/Globulin Ratio: 1.8 (ref 1.2–2.2)
Albumin: 4.8 g/dL (ref 3.8–4.9)
Alkaline Phosphatase: 65 IU/L (ref 48–121)
BUN/Creatinine Ratio: 17 (ref 9–23)
BUN: 12 mg/dL (ref 6–24)
Bilirubin Total: 0.5 mg/dL (ref 0.0–1.2)
CO2: 24 mmol/L (ref 20–29)
Calcium: 9.6 mg/dL (ref 8.7–10.2)
Chloride: 99 mmol/L (ref 96–106)
Creatinine, Ser: 0.71 mg/dL (ref 0.57–1.00)
GFR calc Af Amer: 112 mL/min/{1.73_m2} (ref >59)
GFR calc non Af Amer: 97 mL/min/{1.73_m2} (ref >59)
Globulin, Total: 2.7 g/dL (ref 1.5–4.5)
Glucose: 86 mg/dL (ref 65–99)
Potassium: 5 mmol/L (ref 3.5–5.2)
Sodium: 134 mmol/L (ref 134–144)
Total Protein: 7.5 g/dL (ref 6.0–8.5)

## 2020-02-12 LAB — LIPID PANEL WITH LDL/HDL RATIO
Cholesterol, Total: 201 mg/dL — ABNORMAL HIGH (ref 100–199)
HDL: 61 mg/dL (ref >39)
LDL Chol Calc (NIH): 107 mg/dL — ABNORMAL HIGH (ref 0–99)
LDL/HDL Ratio: 1.8 ratio (ref 0.0–3.2)
Triglycerides: 194 mg/dL — ABNORMAL HIGH (ref 0–149)
VLDL Cholesterol Cal: 33 mg/dL (ref 5–40)

## 2020-02-12 LAB — TSH: TSH: 0.885 u[IU]/mL (ref 0.450–4.500)

## 2020-02-15 ENCOUNTER — Other Ambulatory Visit: Payer: Self-pay | Admitting: Cardiology

## 2020-02-16 ENCOUNTER — Ambulatory Visit: Payer: Medicare PPO | Admitting: Cardiology

## 2020-02-16 ENCOUNTER — Telehealth: Payer: Self-pay | Admitting: Cardiology

## 2020-02-16 DIAGNOSIS — M5416 Radiculopathy, lumbar region: Secondary | ICD-10-CM | POA: Diagnosis not present

## 2020-02-16 DIAGNOSIS — M4126 Other idiopathic scoliosis, lumbar region: Secondary | ICD-10-CM | POA: Diagnosis not present

## 2020-02-16 DIAGNOSIS — G629 Polyneuropathy, unspecified: Secondary | ICD-10-CM | POA: Diagnosis not present

## 2020-02-16 DIAGNOSIS — Z981 Arthrodesis status: Secondary | ICD-10-CM | POA: Diagnosis not present

## 2020-02-16 DIAGNOSIS — M5136 Other intervertebral disc degeneration, lumbar region: Secondary | ICD-10-CM | POA: Diagnosis not present

## 2020-02-24 ENCOUNTER — Other Ambulatory Visit: Payer: Self-pay | Admitting: Neurosurgery

## 2020-02-24 DIAGNOSIS — M4316 Spondylolisthesis, lumbar region: Secondary | ICD-10-CM

## 2020-03-04 NOTE — Telephone Encounter (Signed)
Complete

## 2020-03-18 ENCOUNTER — Other Ambulatory Visit: Payer: Self-pay

## 2020-03-18 ENCOUNTER — Telehealth: Payer: Self-pay

## 2020-03-18 ENCOUNTER — Ambulatory Visit
Admission: RE | Admit: 2020-03-18 | Discharge: 2020-03-18 | Disposition: A | Discharge status: Home / Self Care | Payer: Medicare PPO | Source: Ambulatory Visit | Attending: Neurosurgery | Admitting: Neurosurgery

## 2020-03-18 DIAGNOSIS — M4316 Spondylolisthesis, lumbar region: Secondary | ICD-10-CM

## 2020-03-18 DIAGNOSIS — M48061 Spinal stenosis, lumbar region without neurogenic claudication: Secondary | ICD-10-CM | POA: Diagnosis not present

## 2020-03-19 ENCOUNTER — Other Ambulatory Visit: Payer: Self-pay | Admitting: Cardiology

## 2020-03-19 DIAGNOSIS — E7800 Pure hypercholesterolemia, unspecified: Secondary | ICD-10-CM

## 2020-03-22 DIAGNOSIS — M545 Low back pain: Secondary | ICD-10-CM | POA: Diagnosis not present

## 2020-03-22 DIAGNOSIS — M5416 Radiculopathy, lumbar region: Secondary | ICD-10-CM | POA: Diagnosis not present

## 2020-03-22 DIAGNOSIS — M4316 Spondylolisthesis, lumbar region: Secondary | ICD-10-CM | POA: Diagnosis not present

## 2020-03-22 DIAGNOSIS — M4126 Other idiopathic scoliosis, lumbar region: Secondary | ICD-10-CM | POA: Diagnosis not present

## 2020-03-24 ENCOUNTER — Ambulatory Visit: Payer: Medicare PPO | Admitting: Cardiology

## 2020-03-31 DIAGNOSIS — M545 Low back pain: Secondary | ICD-10-CM | POA: Diagnosis not present

## 2020-03-31 DIAGNOSIS — M5136 Other intervertebral disc degeneration, lumbar region: Secondary | ICD-10-CM | POA: Diagnosis not present

## 2020-03-31 DIAGNOSIS — M533 Sacrococcygeal disorders, not elsewhere classified: Secondary | ICD-10-CM | POA: Diagnosis not present

## 2020-03-31 DIAGNOSIS — M5134 Other intervertebral disc degeneration, thoracic region: Secondary | ICD-10-CM | POA: Diagnosis not present

## 2020-03-31 DIAGNOSIS — M542 Cervicalgia: Secondary | ICD-10-CM | POA: Diagnosis not present

## 2020-03-31 DIAGNOSIS — M5030 Other cervical disc degeneration, unspecified cervical region: Secondary | ICD-10-CM | POA: Diagnosis not present

## 2020-03-31 DIAGNOSIS — T84226A Displacement of internal fixation device of vertebrae, initial encounter: Secondary | ICD-10-CM | POA: Diagnosis not present

## 2020-03-31 DIAGNOSIS — G8929 Other chronic pain: Secondary | ICD-10-CM | POA: Diagnosis not present

## 2020-04-01 ENCOUNTER — Telehealth: Payer: Self-pay | Admitting: *Deleted

## 2020-04-01 NOTE — Telephone Encounter (Signed)
Patient advised.

## 2020-04-01 NOTE — Telephone Encounter (Signed)
When I was confirming appt patient asked id=f she needed to fast. I looked and saw that she just had CMET and LIPID last month, I told her I thought she didn't need to but she wanted me to double check with you. Please advise.

## 2020-04-01 NOTE — Telephone Encounter (Signed)
She does NOT need to be fasting

## 2020-04-04 NOTE — Patient Instructions (Addendum)
  HEALTH MAINTENANCE RECOMMENDATIONS:  It is recommended that you get at least 30 minutes of aerobic exercise at least 5 days/week (for weight loss, you may need as much as 60-90 minutes). This can be any activity that gets your heart rate up. This can be divided in 10-15 minute intervals if needed, but try and build up your endurance at least once a week.  Weight bearing exercise is also recommended twice weekly.  Eat a healthy diet with lots of vegetables, fruits and fiber.  "Colorful" foods have a lot of vitamins (ie green vegetables, tomatoes, red peppers, etc).  Limit sweet tea, regular sodas and alcoholic beverages, all of which has a lot of calories and sugar.  Up to 1 alcoholic drink daily may be beneficial for women (unless trying to lose weight, watch sugars).  Drink a lot of water.  Calcium recommendations are 1200-1500 mg daily (1500 mg for postmenopausal women or women without ovaries), and vitamin D 1000 IU daily.  This should be obtained from diet and/or supplements (vitamins), and calcium should not be taken all at once, but in divided doses.  Monthly self breast exams and yearly mammograms for women over the age of 13 is recommended.  Sunscreen of at least SPF 30 should be used on all sun-exposed parts of the skin when outside between the hours of 10 am and 4 pm (not just when at beach or pool, but even with exercise, golf, tennis, and yard work!)  Use a sunscreen that says "broad spectrum" so it covers both UVA and UVB rays, and make sure to reapply every 1-2 hours.  Remember to change the batteries in your smoke detectors when changing your clock times in the spring and fall.  Use your seat belt every time you are in a car, and please drive safely and not be distracted with cell phones and texting while driving.   Lindsey Rodriguez , Thank you for taking time to come for your Medicare Wellness Visit. I appreciate your ongoing commitment to your health goals. Please review the following  plan we discussed and let me know if I can assist you in the future.   This is a list of the screening recommended for you and due dates:  Health Maintenance  Topic Date Due  .  Hepatitis C: One time screening is recommended by Center for Disease Control  (CDC) for  adults born from [birth year] through 1966.   Never done  . COVID-19 Vaccine (1) Never done  . Flu Shot  02/01/2020  . Mammogram  06/12/2020  . Tetanus Vaccine  09/06/2022  . Colon Cancer Screening  01/22/2023  . HIV Screening  Completed   I recommend getting the new shingles vaccine (Shingrix). You will need to get this at the pharmacy (covered by Medicare part D).  It is a series of 2 injections, spaced 2 months apart.  I encourage you to speak with Dr. Einar Gip regarding your COVID vaccine concerns (with respect to cardiomyopathy).  Your MRI of the hip showed some tendinosis and tear of the gluteus medius, as well as trochanteric bursitis.  If your pain doesn't improve with the SI injection, consideration to these other causes of pain should be given (ie PT, or other injections).

## 2020-04-04 NOTE — Progress Notes (Signed)
Chief Complaint  Patient presents with  . Medicare Wellness    fasting AWV. Is being seen at [facility] for SI joint. Going to have injections end of month.   . Immunizations    does not want flu shot today, may get at a later date. Has not had any covid vaccines. Did not get shingrix at pharmacy.    Lindsey Rodriguez is a 54 y.o. female who presents for annual physical exam, Medicare wellness visit and follow-up on chronic medical conditions.    Hyperlipidemia: LDL 177 at her physical in 03/2019.  It had gone up to 197 in 06/2019.  Repeat in 12/2019, LDL 186--baseline prior to starting meds per Dr. Einar Gip. She was started on Vytorin 10/20 per Dr. Einar Gip. She contacted him in July reporting she was having side effects, and her dose was decreased to 3x/week. She is tolerating this better on this regimen. Last lipids were done in August (about 3 weeks after changing dosing regimen). Lab Results  Component Value Date   CHOL 201 (H) 02/11/2020   HDL 61 02/11/2020   LDLCALC 107 (H) 02/11/2020   TRIG 194 (H) 02/11/2020   CHOLHDL 3.7 12/26/2019   Red meat 3-4 times/week.  Uses ground [place] a lot. Some burgers. +butter. Uses coconut oil. 3-4 eggs/week. Reports her diet hasn't changed at all.  [name], Lotrel was stopped after her weight loss. She remains under the care of Dr. Nadyne Coombes for nonischemic cardiomyopathy, and now remains on lisinopril and carvedilol. She last saw him in June. At that time she reported "episodes of marked dizziness, feeling like she was going to pass out especially when she suddenly stands up.  She reported marked fatigue, marked decreased exercise tolerance and dyspnea on exertion for routine activities." Echo in 12/2019 showed EF 40%, which is slightly lower than prior check (45-50 in 06/2019). Also showed mod MR, stable.  She now reports not having as many dizzy spells (just occasional, not as severe).  Shortness of breath comes and goes, improved. BP's have  been running100-130/60-75.  She continues to follow"the next 56 days",since 08/2015, low carb, "good fats", lean protein, lots of vegetables and water(tries for 90-100 oz daily). Cut out pork, soy, wheat, corn, potatoesand beets.   She sees Dr. Tomi Likens for her headaches, last seen in April. She had upper cervical nerve root ablation around December 2020, which had helped some. He reported at that visit her still having chronic neck pain/oocipital neuralgia, that he was leaving up to pain management. "I'm dealing with it".  She is using Tizanidine prn for her headaches, infrequently.   Chronic back and neck pain.S/p back surgery in 06/2016 by Dr. Genevieve Norlander L3-4 fusion). Prev mentioned plan for lumbar nerve ablation, but did not have that. No notes from any neurosurg.  Dr. Vertell Limber ordered MRI  MRI 03/18/2020: IMPRESSION: 1. Previous discectomy and fusion procedures from L3 to the sacrum. Sufficient patency of the canal and foramina. No evidence of nonunion or hardware complication. 2. Worsening of adjacent segment degenerative disease at L2-3. Spinal stenosis with AP diameter of the canal in the midline as narrow as 4.3 mm. Effacement of the subarachnoid space and crowding of the nerve roots with potential for neural compression.  She got a second opinion at [facility], who felt pain was related to SI joint, and plan injections (but she also reported significant R sided radicular symptoms)  She is scheduled for SI injection later this month. She also had R hip injections, which didn't  help (see below).  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  She is under the care ofDr. Deveshwarforchondrocalcinosis, OA, DJD, and fibromyalgia. She was last seen in April.  No recent flares of chondrocalcinosis. At that time she was having a lot of pain from fibromyalgia. "I think I'm doing ok"--mainly low energy.  Chronic back pain per above, doesn't feel like  fibro/trigger points are flaring.  She continues to see [name] for depression and anxiety, stable on her current regimen.  She saw Dr. Junius Roads, had R intra-articular hip injection in May. Saw Dr. Ninfa Linden in June, was given another hip injection under fluoro.  He was worried about a labral tear, did MRI. IMPRESSION: Right trochanteric bursitis with associated gluteus medius tendinosis. There is a tear of the anterior 0.8 cm of the gluteus medius with approximately 1 cm retraction. There is also likely gluteus minimus tear. The anterior, superior right labrum is degenerated and frayed without focal tear identified.   Immunization History  Administered Date(s) Administered  . Hepatitis B 10/31/2008  . Influenza Split 03/18/2011, 04/02/2014, 05/01/2014  . Influenza Whole 07/03/2009  . Influenza, Seasonal, Injecte, Preservative Fre 06/04/2012  . Influenza,inj,Quad PF,6+ Mos 03/10/2013, 04/07/2015, 03/02/2016, 03/08/2017, 03/13/2018, 04/07/2019  . Influenza-Unspecified 05/03/2012, 04/07/2015  . MMR 07/03/1981  . Pneumococcal Conjugate-13 03/08/2017  . Tdap 07/03/1981, 09/05/2012   She refuses COVID vaccines "I don't trust them"  Last Pap smear: per Dr. Philis Pique, approx 10/2014; s/p hysterectomy(for benign reasons) Last mammogram: 06/2019 Last colonoscopy: 12/2017,normal (previously had polyp); 5 yr f/u rec per Dr. Collene Mares Last DEXA: 06/2019, T-1.4 at L wrist Dentist: twice yearly Ophtho: yearly Exercise:Limited due to back pain.  Tries to walk at least 20 minutes daily. Not currently seeing trainer (used to 2x/week). Vitamin D last checked 06/2019, low at 24.24.  In 02/2016, normal at 35.   Other doctors caring for patient include: Neuro: Dr. Tomi Likens GI: Dr. Collene Mares GYN: Dr. Clare Gandy longer sees) Cardiologist: Dr. Einar Gip Oncologist: Dr.Kale Podiatrist: Dr. Pamella Pert longer sees) Rheumatologist: Dr. Estanislado Pandy Neurosurg: Dr.Max Patrice Paradise, Dr. Payton Mccallum previously; Dr. Vertell Limber ordered most  recent MRI Neuro-ortho at [facility]: Dr. Dava Najjar Dermatologist: [name] Urologist: Dr.Puschinsky(H.Pt) Psych: [name] Ophtho: Bobbie Stack Dentist: Dr. Gabriela Eves Orthopedist: Dr. Tracie Harrier  Depression screen:negative Fall screen:none Functional status survey:remarkable for some leakage of urine (infrequent urge incont.), otherwise unremarkable.  See full questionnaires in epic  End of Life Discussion: Patient hasa living will and medical power of attorney  PMH, PSH, SH and FH were reviewed and updated.  Outpatient Encounter Medications as of 04/05/2020  Medication Sig Note  . ARIPiprazole (ABILIFY) 2 MG tablet Take 2 mg by mouth daily.   . carvedilol (COREG) 6.25 MG tablet TAKE 1 TABLET BY MOUTH TWICE A DAY WITH A MEAL   . ezetimibe-simvastatin (VYTORIN) 10-20 MG tablet TAKE 1 TABLET BY MOUTH DAILY AT 6 PM. 04/05/2020: 3 times a week (M,W and F)  . FLUoxetine (PROZAC) 40 MG capsule Take 80 mg by mouth daily.   Marland Kitchen lisinopril (ZESTRIL) 20 MG tablet TAKE 1/2 TABLET BY MOUTH EVERY EVENING.   Marland Kitchen MISC NATURAL PRODUCT OP Take 2 capsules by mouth daily. Omega-Q   . MISC NATURAL PRODUCTS PO Take 2 capsules by mouth daily. Rejuvenixx    . MISC NATURAL PRODUCTS PO Take 1 capsule by mouth 2 (two) times daily. Optimal- V   . MISC NATURAL PRODUCTS PO Take 1 capsule by mouth 2 (two) times daily. Optimal-M   .  MISC NATURAL PRODUCTS PO Take 1 capsule by mouth 2 (two) times daily. MagnaCal-D 06/20/2017: 2 capsules twice daily  . tiZANidine (ZANAFLEX) 2 MG tablet Take 2 mg by mouth every 6 (six) hours as needed for muscle spasms. 04/05/2020: Uses infrequently, prn neck and LBP.  Currently ran out, NOT taking  . LORazepam (ATIVAN) 0.5 MG tablet Take 1-2 tablets (0.5-1 mg total) by mouth 3 (three) times daily as needed for anxiety. Prn anxiety (Patient not taking: Reported on 04/05/2020) 04/05/2020: Uses prn, infrequent.  Used 2x/week in the last month due to stressors related to children   . [DISCONTINUED] ezetimibe-simvastatin (VYTORIN) 10-20 MG tablet Take 1 tablet by mouth at bedtime.    No facility-administered encounter medications on file as of 04/05/2020.   Allergies  Allergen Reactions  . Penicillins Shortness Of Breath and Rash    Has patient had a PCN reaction causing immediate rash, facial/tongue/throat swelling, SOB or lightheadedness with hypotension: no Has patient had a PCN reaction causing severe rash involving mucus membranes or skin necrosis: no Has patient had a PCN reaction that required hospitalization no Has patient had a PCN reaction occurring within the last 10 years: no If all of the above answers are "NO", then may proceed with Cephalosporin use.   . Codeine Other (See Comments)    Manic depressive  . Voltaren [Diclofenac] Nausea And Vomiting and Other (See Comments)    Pt stated "tore stomach up"  . Erythromycin Rash    ROS: The patient denies anorexia, fever, vision changes, decreased hearing, ear pain, sore throat, breast concerns, chest pain, palpitations, syncope(none since 08/2016), cough, swelling, nausea, vomiting, diarrhea, constipation, abdominal pain, melena, hematochezia, indigestion/heartburn, hematuria, dysuria, vaginal bleeding, discharge, odor or itch, genital lesions, tremor, suspicious skin lesions, depression, abnormal bleeding/bruising, or enlarged lymph nodes. Some vaginal dryness.  Denies hot flashes +urinary urgency and urge incontinence--infrequent/rare Neuropathy in feet--cold, tingly Headaches, back and neck painper HPI. Depression/anxiety stable overall. Dizziness has improved.  Still has some DOE intermittently.   PHYSICAL EXAM:  BP 120/72   Pulse (!) 56   Ht $R'5\' 6"'ic$  (1.676 m)   Wt 169 lb (76.7 kg)   LMP 12/16/2012   BMI 27.28 kg/m   Wt Readings from Last 3 Encounters:  04/05/20 169 lb (76.7 kg)  01/22/20 169 lb 4.8 oz (76.8 kg)  12/23/19 171 lb 6.4 oz (77.7 kg)    General Appearance:  Alert,  cooperative, no distress, appears stated age.  Head:  Normocephalic, without obvious abnormality, atraumatic, nontender  Eyes:  PERRL, conjunctiva/corneas clear, EOM's intact, fundi benign  Ears:  Normal TM's and external ear canals  Nose: Not examined, wearing mask due to COVID-19 pandemic  Throat: Not examined, wearing mask due to COVID-19 pandemic  Neck: Supple, no lymphadenopathy; thyroid: no enlargement/ tenderness/nodules; no carotid bruit or JVD  Back:  No c-spine tenderness or spasm, no trigger points in upper back. Tender starting at lower thoracic/upper lumbar spine, down to sacrum. Tender at R SI and gluteal muscles  Lungs:  Clear to auscultation bilaterally without wheezes, rales or ronchi; respirations unlabored  Chest Wall:  No tenderness or deformity  Heart:  regular rate and rhythm,no murmur, rub or gallop  Breast Exam:  no nipple inversion, discharge, skin dimpling, breast masses or axillary lymphadenopathy.   Abdomen:  Soft, non-tender, nondistended, normoactive bowel sounds, no masses, no hepatosplenomegaly  Genitalia:  normal external genitalia without lesions. Normal bimanual exam. Uterus surgically absent. No adnexal masses or tenderness  Rectal: Normal sphincter tone,  no mass. Heme negative stool  Extremities: No clubbing, cyanosis or edema. High arches. Mildly tender at R trochanteric bursa  Pulses: 2+ and symmetric all extremities  Skin: Skin color, texture, turgor normal, no rashes or lesions  Lymph nodes: Cervical, supraclavicular, and axillary nodes normal  Neurologic: Normal strength, sensation and gait; reflexes 1+and symmetric  Psych: Normal mood, affect, hygiene and grooming. Pauses (gathering thoughts) when asked questions, but overall speech is normal (more fluent and normal than other visits in the past).   ASSESSMENT/PLAN:  Annual physical exam  Medicare annual wellness visit,  subsequent  Nonischemic cardiomyopathy ([place]) - stable on current regimen  Mixed hyperlipidemia - reviewed lipids, elevated TG. Cont Vytorin per Dr. Einar Gip. Pt requests repeat labs, has appt with him later this week - Plan: Lipid panel  MGUS (monoclonal gammopathy of unknown significance) - stable  Fibromyalgia - stable. Mainly having chronic low back pain, not related to FM  Vitamin D deficiency - recheck today; last value low, never fully addressed bc she cancelled her f/u appt. - Plan: VITAMIN D 25 Hydroxy (Vit-D Deficiency, Fractures)  Chronic migraine without aura without status migrainosus, not intractable - under the care of Dr. Tomi Likens.  Doing ok re: migraines. Has some chronic neck/occipital pain  Need for hepatitis C screening test - Plan: Hepatitis C antibody  Need for influenza vaccination - Plan: Flu Vaccine QUAD 6+ mos PF IM (Fluarix Quad PF)  Vaccine counseling - counseled in detail re: COVID vaccines. She is hesistant esp related to CM. Rec she address with cardiologist.    Flu shot today. She refuses COVID vaccines. Counseled regarding risks/benefits.  Has appt with Dr. Einar Gip this week (to discuss cardiomyopathy risks)  She wants lipids done today, despite having been done in August.  She had decreased the dose to 3x/week a few weeks prior to last check.  She doesn't like going to [facility] to get the labs Dr. Einar Gip orders.  She wants [place] to draw them today and forward to him.   SEND LIPDS TO DR Einar Gip  Discussed monthly self breast exams and yearly mammograms; at least 30 minutes of aerobic activity at least 5 days/week, weight-bearing exercise at least 2x/wk; proper sunscreen use reviewed; healthy diet, including goals of calcium and vitamin D intake and alcohol recommendations (less than or equal to 1 drink/day) reviewed; regular seatbelt use; changing batteries in smoke detectors. Immunization recommendations discussed--continue yearly flu shots.Shingrix recommendedneeds  to get from pharmacy [facility]).COVID recommended, declined by pt. Pneumovax age 35.Colonoscopy recommendations reviewed,UTD, due again 01/2023.  F/u 1 year, sooner prn.  Your MRI of the hip showed some tendinosis and tear of the gluteus medius, as well as trochanteric bursitis.  If your pain doesn't improve with the SI injection, consideration to these other causes of pain should be given (ie PT, or other injections).    Medicare Attestation I have personally reviewed: The patient's medical and social history Their use of alcohol, tobacco or illicit drugs Their current medications and supplements The patient's functional ability including ADLs,fall risks, home safety risks, cognitive, and hearing and visual impairment Diet and physical activities Evidence for depression or mood disorders  The patient's weight, height, BMI have been recorded in the chart.  I have made referrals, counseling, and provided education to the patient based on review of the above and I have provided the patient with a written personalized care plan for preventive services.

## 2020-04-05 ENCOUNTER — Other Ambulatory Visit: Payer: Self-pay

## 2020-04-05 ENCOUNTER — Encounter: Payer: Self-pay | Admitting: Family Medicine

## 2020-04-05 ENCOUNTER — Ambulatory Visit (INDEPENDENT_AMBULATORY_CARE_PROVIDER_SITE_OTHER): Payer: Medicare PPO | Admitting: Family Medicine

## 2020-04-05 VITALS — BP 120/72 | HR 56 | Ht 66.0 in | Wt 169.0 lb

## 2020-04-05 DIAGNOSIS — Z7185 Encounter for immunization safety counseling: Secondary | ICD-10-CM

## 2020-04-05 DIAGNOSIS — M797 Fibromyalgia: Secondary | ICD-10-CM

## 2020-04-05 DIAGNOSIS — Z23 Encounter for immunization: Secondary | ICD-10-CM

## 2020-04-05 DIAGNOSIS — Z1159 Encounter for screening for other viral diseases: Secondary | ICD-10-CM

## 2020-04-05 DIAGNOSIS — E559 Vitamin D deficiency, unspecified: Secondary | ICD-10-CM | POA: Diagnosis not present

## 2020-04-05 DIAGNOSIS — I428 Other cardiomyopathies: Secondary | ICD-10-CM | POA: Diagnosis not present

## 2020-04-05 DIAGNOSIS — E782 Mixed hyperlipidemia: Secondary | ICD-10-CM

## 2020-04-05 DIAGNOSIS — Z0000 Encounter for general adult medical examination without abnormal findings: Secondary | ICD-10-CM | POA: Diagnosis not present

## 2020-04-05 DIAGNOSIS — G43709 Chronic migraine without aura, not intractable, without status migrainosus: Secondary | ICD-10-CM

## 2020-04-05 DIAGNOSIS — D472 Monoclonal gammopathy: Secondary | ICD-10-CM | POA: Diagnosis not present

## 2020-04-06 LAB — VITAMIN D 25 HYDROXY (VIT D DEFICIENCY, FRACTURES): Vit D, 25-Hydroxy: 47.6 ng/mL (ref 30.0–100.0)

## 2020-04-06 LAB — LIPID PANEL
Chol/HDL Ratio: 3.2 ratio (ref 0.0–4.4)
Cholesterol, Total: 205 mg/dL — ABNORMAL HIGH (ref 100–199)
HDL: 64 mg/dL (ref >39)
LDL Chol Calc (NIH): 119 mg/dL — ABNORMAL HIGH (ref 0–99)
Triglycerides: 125 mg/dL (ref 0–149)
VLDL Cholesterol Cal: 22 mg/dL (ref 5–40)

## 2020-04-06 LAB — HEPATITIS C ANTIBODY: Hep C Virus Ab: 0.2 s/co ratio (ref 0.0–0.9)

## 2020-04-06 NOTE — Progress Notes (Signed)
Thanks Eve, got it, will probably add Nexlitol 180 daily

## 2020-04-07 ENCOUNTER — Ambulatory Visit: Payer: Medicare PPO | Admitting: Cardiology

## 2020-04-07 ENCOUNTER — Other Ambulatory Visit: Payer: Self-pay

## 2020-04-07 ENCOUNTER — Encounter: Payer: Self-pay | Admitting: Cardiology

## 2020-04-07 VITALS — BP 119/71 | HR 67 | Resp 15 | Ht 66.0 in | Wt 170.0 lb

## 2020-04-07 DIAGNOSIS — I447 Left bundle-branch block, unspecified: Secondary | ICD-10-CM | POA: Diagnosis not present

## 2020-04-07 DIAGNOSIS — E7800 Pure hypercholesterolemia, unspecified: Secondary | ICD-10-CM

## 2020-04-07 DIAGNOSIS — I428 Other cardiomyopathies: Secondary | ICD-10-CM

## 2020-04-07 DIAGNOSIS — I10 Essential (primary) hypertension: Secondary | ICD-10-CM | POA: Diagnosis not present

## 2020-04-07 DIAGNOSIS — I493 Ventricular premature depolarization: Secondary | ICD-10-CM | POA: Diagnosis not present

## 2020-04-07 MED ORDER — LISINOPRIL 10 MG PO TABS: 10.0000 mg | ORAL_TABLET | Freq: Every day | ORAL | 3 refills | Status: DC

## 2020-04-07 MED ORDER — EZETIMIBE-SIMVASTATIN 10-20 MG PO TABS: 1.0000 | ORAL_TABLET | ORAL | 1 refills | Status: DC

## 2020-04-07 NOTE — Progress Notes (Signed)
Primary Physician/Referring:  Rita Ohara, MD  Patient ID: Lindsey Rodriguez, female    DOB: 12/08/1965, 54 y.o.   MRN: [id number]  Chief Complaint  Patient presents with  . Follow-up    3 month  . Cardiomyopathy  . Hyperlipidemia   HPI:    Lindsey Rodriguez  is a 54 y.o.  Caucasian female, with frequent PVCs and  new onset left bundle branch block and an echo revealing EF 30% in 2017. Nuclear stress test was negative for ischemia, EF 33%. She is now being treated for nonischemic cardiomyopathy, ejection fraction has improved to 45% by echocardiogram in 2020. She has hypertension and hyperlipidemia.  She has also been diagnosed with anxiety and depression, panic attacks, severe degenerative L5S1 spine disease and also fibromyalgia.  On her last office visit 6 months ago, she had marked fatigue and also shortness of breath and I repeated her echocardiogram.  EF in June 2021 was reduced moderately at 40%.  This was down from around 45%.  She is presently feeling well except for severe back pain.  No dyspnea, no leg edema, no PND or orthopnea.  Past Medical History:  Diagnosis Date  . Anemia   . Anxiety panic attacks   [name]  . Arthritis    knees  . Cardiomyopathy, nonischemic ([place]) 04/07/2016   EF left ventricule calculated on Lexiscan stress test at 33% with global hypokinesis.   . Chronic back pain   . Chronic neck pain   . Depression   . Dysrhythmia    pvc's with cardiomyopathy  . Family history of adverse reaction to anesthesia    mother has problems waking up  . Fibromyalgia    and neuropathy of feet  . History of kidney stones    lithotripsy  03/2016  . Hyperlipidemia   . Hypertension   . Kidney stone    History  . Kidney stones h/o  . Melanoma ([place]) RUE   [name]  . MGUS (monoclonal gammopathy of unknown significance) 06/2015   no treatment just watching recheck blood work 12/2015  . Migraine    OTC med prn - stress related  . Plantar fasciitis right  .  Renal stones 2008/2011/2017  . Seasonal allergies   . SVD (spontaneous vaginal delivery)    x 3  . Tendonitis of foot right   Past Surgical History:  Procedure Laterality Date  . ABDOMINAL HYSTERECTOMY    . ABLATION ON ENDOMETRIOSIS N/A 11/24/2015   Procedure: ABLATION ON ENDOMETRIOSIS;  Surgeon: [name], MD;  Location: [facility];  Service: Gynecology;  Laterality: N/A;  . ANTERIOR LAT LUMBAR FUSION Right 06/15/2016   Procedure: RIGHT LUMBAR THREE-FOUR ANTEROLATERAL LUMBAR INTERBODY FUSION WITH LATERAL PLATE;  Surgeon: Erline Levine, MD;  Location: [facility];  Service: Neurosurgery;  Laterality: Right;  . BACK SURGERY  Dr.Stern, last sx Summer 2011   X 4  (L3-S1)  . BACK SURGERY  06/2016  . BLADDER SUSPENSION N/A 11/24/2015   Procedure: TRANSVAGINAL TAPE (TVT) PROCEDURE;  Surgeon: [name], MD;  Location: [facility];  Service: Gynecology;  Laterality: N/A;  . C2-3 nerve ablation Right 06/2019   Dr. Maia Petties (per pt)  . COLONOSCOPY    . CYSTOCELE REPAIR  11/24/2015   Procedure: ANTERIOR REPAIR (CYSTOCELE);  Surgeon: [name], MD;  Location: [facility];  Service: Gynecology;;  . Consuela Mimes N/A 11/24/2015   Procedure: CYSTOSCOPY;  Surgeon: [name], MD;  Location: [facility];  Service: Gynecology;  Laterality: N/A;  . DILATION  AND CURETTAGE OF UTERUS    . ENDOMETRIAL ABLATION  08/2000  . EYE SURGERY Bilateral    Lasik   . KIDNEY STONE SURGERY  2011  . LITHOTRIPSY  08/2017 (L), 03/2018 (R)  . MELANOMA EXCISION  05/2015   right forearm; [name]  . NECK SURGERY  [name]  Summer 2011   X 4  (C4-T1 Level)  . RHINOPLASTY    . ROBOTIC ASSISTED BILATERAL SALPINGO OOPHERECTOMY Bilateral 11/24/2015   Procedure: ROBOTIC ASSISTED BILATERAL SALPINGO OOPHORECTOMY;  Surgeon: [name], MD;  Location: [facility];  Service: Gynecology;  Laterality: Bilateral;--OVARIES NOT REMOVED PER PATH REPORT  . ROBOTIC ASSISTED TOTAL HYSTERECTOMY WITH SALPINGECTOMY Bilateral 11/24/2015   Procedure:  ROBOTIC ASSISTED TOTAL HYSTERECTOMY WITH SALPINGECTOMY;  Surgeon: [name], MD;  Location: [facility];  Service: Gynecology;  Laterality: Bilateral;  . TONSILLECTOMY    . WISDOM TOOTH EXTRACTION     Social History   Tobacco Use  . Smoking status: Former Smoker    Packs/day: 0.10    Years: 3.00    Pack years: 0.30    Types: Cigarettes    Quit date: 07/04/1987    Years since quitting: 32.7  . Smokeless tobacco: Never Used  Substance Use Topics  . Alcohol use: Yes    Alcohol/week: 0.0 standard drinks    Comment: rarely    Marital Status: Married   ROS  Review of Systems  Constitutional: Negative for malaise/fatigue.  Cardiovascular: Negative for dyspnea on exertion.  Musculoskeletal: Positive for back pain, joint pain, muscle weakness and myalgias.  Neurological: Positive for dizziness.  Psychiatric/Behavioral: Positive for depression. The patient is nervous/anxious.    Objective   Vitals with BMI 04/07/2020 04/05/2020 01/22/2020  Height 5\' 6"  5\' 6"  5' 6.5"  Weight 170 lbs 169 lbs 169 lbs 5 oz  BMI 27.45 97.98 92.11  Systolic 941 740 96  Diastolic 71 72 52  Pulse 67 56 63     Physical Exam Constitutional:      Appearance: She is well-developed and normal weight.  Cardiovascular:     Rate and Rhythm: Normal rate and regular rhythm. Occasional extrasystoles are present.    Pulses: Intact distal pulses.     Heart sounds: Murmur heard.  Midsystolic murmur is present with a grade of 2/6 radiating to the apex.  No gallop.      Comments: No leg edema, no JVD. Pulmonary:     Effort: Pulmonary effort is normal.     Breath sounds: Normal breath sounds.  Abdominal:     General: Bowel sounds are normal.     Palpations: Abdomen is soft.  Neurological:     Mental Status: She is alert.    Laboratory examination:   03/13/2018: LDL particle #1742. LDL 140. HDL 71, triglycerides 93, cholesterol 230. Small LDL-P 333. LDL size 21.4. LP-IR score 51. NHDL 159.  Recent Labs     06/23/19 0807 01/15/20 0814 02/11/20 1018  NA 138 138 134  K 4.6 4.5 5.0  CL 101 102 99  CO2 29 27 24   GLUCOSE 90 82 86  BUN 13 14 12   CREATININE 0.85 0.85 0.71  CALCIUM 9.8 9.7 9.6  GFRNONAA >60 >60 97  GFRAA >60 >60 112   CrCl cannot be calculated (Patient's most recent lab result is older than the maximum 21 days allowed.).  CMP Latest Ref Rng & Units 02/11/2020 01/15/2020 06/23/2019  Glucose 65 - 99 mg/dL 86 82 90  BUN 6 - 24 mg/dL 12 14 13  Creatinine 0.57 - 1.00 mg/dL 0.71 0.85 0.85  Sodium 134 - 144 mmol/L 134 138 138  Potassium 3.5 - 5.2 mmol/L 5.0 4.5 4.6  Chloride 96 - 106 mmol/L 99 102 101  CO2 20 - 29 mmol/L 24 27 29   Calcium 8.7 - 10.2 mg/dL 9.6 9.7 9.8  Total Protein 6.0 - 8.5 g/dL 7.5 7.9 8.1  Total Bilirubin 0.0 - 1.2 mg/dL 0.5 0.7 0.5  Alkaline Phos 48 - 121 IU/L 65 53 61  AST 0 - 40 IU/L 26 28 24   ALT 0 - 32 IU/L 27 35 31   CBC Latest Ref Rng & Units 01/15/2020 06/23/2019 12/18/2018  WBC 4.0 - 10.5 K/uL 5.2 5.6 4.9  Hemoglobin 12.0 - 15.0 g/dL 13.1 13.3 13.3  Hematocrit 36 - 46 % 38.2 39.3 39.7  Platelets 150 - 400 K/uL 226 259 258   Lipid Panel Recent Labs    06/23/19 0807 06/23/19 0807 12/26/19 0810 02/11/20 1018 04/05/20 0949  CHOL 295*  --  272* 201* 205*  TRIG 140   < > 76 194* 125  LDLCALC 197*  --  186* 107* 119*  VLDL 28  --   --   --   --   HDL 70  --  74 61 64  CHOLHDL 4.2  --  3.7  --  3.2   < > = values in this interval not displayed.    HEMOGLOBIN A1C Lab Results  Component Value Date   HGBA1C 5.5 01/27/2013   TSH Recent Labs    02/11/20 1018  TSH 0.885   Medications and allergies   Allergies  Allergen Reactions  . Penicillins Shortness Of Breath and Rash    Has patient had a PCN reaction causing immediate rash, facial/tongue/throat swelling, SOB or lightheadedness with hypotension: no Has patient had a PCN reaction causing severe rash involving mucus membranes or skin necrosis: no Has patient had a PCN reaction that  required hospitalization no Has patient had a PCN reaction occurring within the last 10 years: no If all of the above answers are "NO", then may proceed with Cephalosporin use.   . Codeine Other (See Comments)    Manic depressive  . Voltaren [Diclofenac] Nausea And Vomiting and Other (See Comments)    Pt stated "tore stomach up"  . Crestor [Rosuvastatin] Other (See Comments)    Myalgia  . Erythromycin Rash    Current Outpatient Medications  Medication Instructions  . ARIPiprazole (ABILIFY) 2 mg, Oral, Daily  . carvedilol (COREG) 6.25 MG tablet TAKE 1 TABLET BY MOUTH TWICE A DAY WITH A MEAL  . ezetimibe-simvastatin (VYTORIN) 10-20 MG tablet 1 tablet, Oral, Every other day  . FLUoxetine (PROZAC) 80 mg, Oral, Daily  . lisinopril (ZESTRIL) 10 mg, Oral, Daily  . LORazepam (ATIVAN) 0.5-1 mg, Oral, 3 times daily PRN, Prn anxiety  . MISC NATURAL PRODUCT OP 2 capsules, Oral, Daily, Omega-Q   . MISC NATURAL PRODUCTS PO 2 capsules, Oral, Daily, Rejuvenixx   . MISC NATURAL PRODUCTS PO 1 capsule, Oral, 2 times daily, Optimal- V  . MISC NATURAL PRODUCTS PO 1 capsule, Oral, 2 times daily, Optimal-M  . MISC NATURAL PRODUCTS PO 1 capsule, Oral, 2 times daily, MagnaCal-D  . tiZANidine (ZANAFLEX) 2 mg, Oral, Every 6 hours PRN   Radiology:  No results found. Cardiac Studies:   Lexiscan myoview stress test 04/07/2016: 1. Resting EKG demonstrates normal sinus rhythm with frequent PVCs in the pattern of ventricular bigeminy.  Low-voltage complexes.  PVCs decreased in  the recovery phase of Lexiscan stress test. 2. Left ventricular cavity is noted to be mildly dilated with LV end diastolic volume of 093 mL on the rest and stress studies. Marland Kitchen SPECT images demonstrate homogeneous tracer distribution throughout the myocardium.  The left ventricular ejection fraction was calculated or visually estimated to be 33% with global hypokinesis. Findings are consistent with nonischemic cardiomyopathy.  Holter monitor  06/06/2017: Sinus rhythm.  Minimum heart rate 54 bpm, average heart rate 71 bpm, maximum heart rate 102 bpm.  Total ventricular ectopy 3.3%.  Less than 1% supraventricular ectopy  No atrial fibrillation, atrial flutter, ventricular tachycardia.  Echocardiogram 12/30/2019: Left ventricle cavity is normal in size. Mild concentric hypertrophy of the left ventricle. Normal global wall motion. Moderate global hypokinesis. LVEF 40%. Doppler evidence of grade I (impaired) diastolic dysfunction, normal LAP. Calculated EF 40%. Moderate (Grade II) mitral regurgitation. Mild tricuspid regurgitation.  No evidence of pulmonary hypertension. Compared to previous study on 06/13/2019, LVEF mildly reduced from 45-50% to about 40%.    EKG:   EKG 12/23/2019: Sinus bradycardia at rate of 54 bpm, left bundle branch block.  No further analysis.  Assessment     ICD-10-CM   1. Non-ischemic cardiomyopathy (HCC)  I42.8 lisinopril (ZESTRIL) 10 MG tablet    PCV ECHOCARDIOGRAM COMPLETE  2. LBBB (left bundle branch block)  I44.7 PCV ECHOCARDIOGRAM COMPLETE  3. Frequent PVCs  I49.3   4. Essential hypertension, benign  I10 lisinopril (ZESTRIL) 10 MG tablet  5. Hypercholesteremia  E78.00 ezetimibe-simvastatin (VYTORIN) 10-20 MG tablet   Recommendations:   Meds ordered this encounter  Medications  . lisinopril (ZESTRIL) 10 MG tablet    Sig: Take 1 tablet (10 mg total) by mouth daily.    Dispense:  90 tablet    Refill:  3  . ezetimibe-simvastatin (VYTORIN) 10-20 MG tablet    Sig: Take 1 tablet by mouth every other day.    Dispense:  90 tablet    Refill:  1    Lindsey Rodriguez  is a 54 y.o. Caucasian female, with frequent PVCs and  new onset left bundle branch block and an echo revealing EF 30% in 2017. Nuclear stress test was negative for ischemia, EF 33%. She is now being treated for nonischemic cardiomyopathy, ejection fraction has improved to 45% by echocardiogram in 2020. She has hypertension and  hyperlipidemia.  She has also been diagnosed with anxiety and depression, panic attacks, severe degenerative L5S1 spine disease and also fibromyalgia.   I reviewed her labs, LDL remarkably improved on Vytorin 20/10 mg 3 days a week.  She is nearly at goal to less than 100, advised her to see if she could increase it to every other day.  Also advised her to increase flaxseed flakes after a major meal on a daily basis.  Fortunately her fatigue and decreased exercise tolerance all of these have resolved and there is no clinical evidence of heart failure.  I reviewed her echocardiogram, EF has clearly decreased to around 40% from 45% previously, we will continue to monitor this and repeat echocardiogram in 6 months.  If she continues to decrease her LVEF, we should certainly consider PVC ablation and she may need extended EKG monitoring for PVC burden.  She continues to have occasional PVCs presently during examination.  With regard to blood pressure, well controlled on lisinopril.  She has had difficulty in taking high dose of any other medications, I refilled lisinopril.  She will continue with carvedilol at 6.25 mg p.o. twice  daily.  She could not tolerate higher dose due to dizziness and low blood pressure.  I will see her back in 6 months.   Adrian Prows, MD, [facility] 04/07/2020, 4:17 PM Office: [phone]

## 2020-04-09 NOTE — Progress Notes (Deleted)
Office Visit Note  Patient: Lindsey Rodriguez             Date of Birth: [birth year]           MRN: [id number]             PCP: Rita Ohara, MD Referring: Rita Ohara, MD Visit Date: 04/22/2020 Occupation: _0 @  Subjective:  No chief complaint on file.   History of Present Illness: Lindsey Rodriguez is a 54 y.o. female ***   Activities of Daily Living:  Patient reports morning stiffness for *** {minute/hour:19697}.   Patient {ACTIONS;DENIES/REPORTS:21021675::"Denies"} nocturnal pain.  Difficulty dressing/grooming: {ACTIONS;DENIES/REPORTS:21021675::"Denies"} Difficulty climbing stairs: {ACTIONS;DENIES/REPORTS:21021675::"Denies"} Difficulty getting out of chair: {ACTIONS;DENIES/REPORTS:21021675::"Denies"} Difficulty using hands for taps, buttons, cutlery, and/or writing: {ACTIONS;DENIES/REPORTS:21021675::"Denies"}  No Rheumatology ROS completed.   PMFS History:  Patient Active Problem List   Diagnosis Date Noted  . Hyperlipidemia   . Irregular periods 12/25/2018  . Menopausal symptom 12/25/2018  . Occult blood in stools 12/25/2018  . Lumbar scoliosis 06/15/2016  . Chondrocalcinosis 05/15/2016  . Primary osteoarthritis of both knees 05/15/2016  . Tendinopathy of right shoulder 05/15/2016  . Spondylosis of lumbar region 05/15/2016  . Plantar fasciitis, bilateral 05/15/2016  . Renal calcinosis 05/15/2016  . Malignant melanoma of right upper extremity including shoulder ([place]) 05/15/2016  . Nonischemic cardiomyopathy ([place]) 04/10/2016  . Post op infection 12/18/2015  . Postoperative state 11/24/2015  . Chronic migraine without aura without status migrainosus, not intractable 08/04/2015  . Fibromyalgia 07/26/2015  . MGUS (monoclonal gammopathy of unknown significance) 06/10/2015  . DJD (degenerative joint disease), cervical 03/02/2015  . Migraine headache 09/05/2012  . Fatigue 10/26/2011  . Pure hyperglyceridemia 04/27/2011  . Essential hypertension, benign 04/17/2011   . Vitamin D deficiency 04/17/2011  . Neuropathy 12/22/2010  . Headache 12/22/2010    Past Medical History:  Diagnosis Date  . Anemia   . Anxiety panic attacks   [name]  . Arthritis    knees  . Cardiomyopathy, nonischemic ([place]) 04/07/2016   EF left ventricule calculated on Lexiscan stress test at 33% with global hypokinesis.   . Chronic back pain   . Chronic neck pain   . Depression   . Dysrhythmia    pvc's with cardiomyopathy  . Family history of adverse reaction to anesthesia    mother has problems waking up  . Fibromyalgia    and neuropathy of feet  . History of kidney stones    lithotripsy  03/2016  . Hyperlipidemia   . Hypertension   . Kidney stone    History  . Kidney stones h/o  . Melanoma ([place]) RUE   [name]  . MGUS (monoclonal gammopathy of unknown significance) 06/2015   no treatment just watching recheck blood work 12/2015  . Migraine    OTC med prn - stress related  . Plantar fasciitis right  . Renal stones 2008/2011/2017  . Seasonal allergies   . SVD (spontaneous vaginal delivery)    x 3  . Tendonitis of foot right    Family History  Problem Relation Age of Onset  . Hypertension Mother   . Depression Mother   . Migraines Mother   . Hypothyroidism Mother   . Hypercholesterolemia Mother   . COPD Father   . Heart failure Father 82  . Healthy Brother   . Healthy Daughter   . Healthy Son   . Healthy Daughter   . Diabetes Neg Hx    Past Surgical History:  Procedure Laterality Date  .  ABDOMINAL HYSTERECTOMY    . ABLATION ON ENDOMETRIOSIS N/A 11/24/2015   Procedure: ABLATION ON ENDOMETRIOSIS;  Surgeon: [name], MD;  Location: [facility];  Service: Gynecology;  Laterality: N/A;  . ANTERIOR LAT LUMBAR FUSION Right 06/15/2016   Procedure: RIGHT LUMBAR THREE-FOUR ANTEROLATERAL LUMBAR INTERBODY FUSION WITH LATERAL PLATE;  Surgeon: Erline Levine, MD;  Location: [facility];  Service: Neurosurgery;  Laterality: Right;  . BACK SURGERY  Dr.Stern, last sx  Summer 2011   X 4  (L3-S1)  . BACK SURGERY  06/2016  . BLADDER SUSPENSION N/A 11/24/2015   Procedure: TRANSVAGINAL TAPE (TVT) PROCEDURE;  Surgeon: [name], MD;  Location: [facility];  Service: Gynecology;  Laterality: N/A;  . C2-3 nerve ablation Right 06/2019   Dr. Maia Petties (per pt)  . COLONOSCOPY    . CYSTOCELE REPAIR  11/24/2015   Procedure: ANTERIOR REPAIR (CYSTOCELE);  Surgeon: [name], MD;  Location: [facility];  Service: Gynecology;;  . Consuela Mimes N/A 11/24/2015   Procedure: CYSTOSCOPY;  Surgeon: [name], MD;  Location: [facility];  Service: Gynecology;  Laterality: N/A;  . DILATION AND CURETTAGE OF UTERUS    . ENDOMETRIAL ABLATION  09-20-00  . EYE SURGERY Bilateral    Lasik   . KIDNEY STONE SURGERY  2011  . LITHOTRIPSY  08/2017 (L), 03/2018 (R)  . MELANOMA EXCISION  05/2015   right forearm; [name]  . NECK SURGERY  [name]  Summer 2011   X 4  (C4-T1 Level)  . RHINOPLASTY    . ROBOTIC ASSISTED BILATERAL SALPINGO OOPHERECTOMY Bilateral 11/24/2015   Procedure: ROBOTIC ASSISTED BILATERAL SALPINGO OOPHORECTOMY;  Surgeon: [name], MD;  Location: [facility];  Service: Gynecology;  Laterality: Bilateral;--OVARIES NOT REMOVED PER PATH REPORT  . ROBOTIC ASSISTED TOTAL HYSTERECTOMY WITH SALPINGECTOMY Bilateral 11/24/2015   Procedure: ROBOTIC ASSISTED TOTAL HYSTERECTOMY WITH SALPINGECTOMY;  Surgeon: [name], MD;  Location: [facility];  Service: Gynecology;  Laterality: Bilateral;  . TONSILLECTOMY    . WISDOM TOOTH EXTRACTION     Social History   Social History Narrative   Married.  Lives with husband, 1 dog (and granddog)   1 daughter is getting her Masters in [place].   Son moved out 07/2018, local, 1 granddaughter from him (who lives with the mother). Son and granddaughter stay with them every other weekend.   1 daughter in [place], [place], got married 2021 (husband is from [place]).   brother-in-law (with Down's syndrome) , had a stroke, in SNF, passed away in  September 21, 2019   Disabled due to anxiety.  Previously worked as Public relations account executive.   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   Working 1 day/week at [employer].      Updated 04/2020      Pt is right-handed.   Caffeine - coffee I cup in am/   Immunization History  Administered Date(s) Administered  . Hepatitis B 10/31/2008  . Influenza Split 03/18/2011, 04/02/2014, 05/01/2014  . Influenza Whole 07/03/2009  . Influenza, Seasonal, Injecte, Preservative Fre 06/04/2012  . Influenza,inj,Quad PF,6+ Mos 03/10/2013, 04/07/2015, 03/02/2016, 03/08/2017, 03/13/2018, 04/07/2019, 04/05/2020  . Influenza-Unspecified 05/03/2012, 04/07/2015  . MMR 07/03/1981  . Pneumococcal Conjugate-13 03/08/2017  . Tdap 07/03/1981, 09/05/2012     Objective: Vital Signs: LMP 12/16/2012    Physical Exam   Musculoskeletal Exam: ***  CDAI Exam: CDAI Score: -- Patient Global: --; Provider Global: -- Swollen: --; Tender: -- Joint Exam 04/22/2020   No joint exam has been documented for this visit   There is currently no information  documented on the homunculus. Go to the Rheumatology activity and complete the homunculus joint exam.  Investigation: No additional findings.  Imaging: MR LUMBAR SPINE WO CONTRAST  Result Date: 03/19/2020 CLINICAL DATA:  Low back pain radiating down the legs. Previous lumbar fusion. EXAM: MRI LUMBAR SPINE WITHOUT CONTRAST TECHNIQUE: Multiplanar, multisequence MR imaging of the lumbar spine was performed. No intravenous contrast was administered. COMPARISON:  05/31/2016 MRI.  02/08/2018 myelography. FINDINGS: Segmentation: 5 lumbar type vertebral bodies as numbered previously. Alignment:  Normal Vertebrae: No fracture or primary bone lesion. Previous lateral discectomy infusion at L3-4. Previous PLIF L4 to sacrum. Conus medullaris and cauda equina: Conus extends to the L1 level. Conus and cauda equina appear normal. Paraspinal and other soft tissues: Negative Disc levels: Mild non-compressive  disc bulges at T12-L1 and L1-2. Disc degeneration at L2-3 with loss of disc height. Endplate osteophytes and shallow protrusion of the disc. Spinal stenosis with AP diameter of the canal in the midline as narrow as 4.3 mm. Effacement of the subarachnoid space and crowding of the nerve roots with potential for neural compression. Spinal stenosis has worsened since the previous myelogram and previous MRI. L3 to sacrum: Previous discectomy and fusion procedures. Sufficient patency of the canal and foramina. No evidence of nonunion or hardware complication. IMPRESSION: 1. Previous discectomy and fusion procedures from L3 to the sacrum. Sufficient patency of the canal and foramina. No evidence of nonunion or hardware complication. 2. Worsening of adjacent segment degenerative disease at L2-3. Spinal stenosis with AP diameter of the canal in the midline as narrow as 4.3 mm. Effacement of the subarachnoid space and crowding of the nerve roots with potential for neural compression. Electronically Signed   By: Nelson Chimes M.D.   On: 03/19/2020 08:15    Recent Labs: Lab Results  Component Value Date   WBC 5.2 01/15/2020   HGB 13.1 01/15/2020   PLT 226 01/15/2020   NA 134 02/11/2020   K 5.0 02/11/2020   CL 99 02/11/2020   CO2 24 02/11/2020   GLUCOSE 86 02/11/2020   BUN 12 02/11/2020   CREATININE 0.71 02/11/2020   BILITOT 0.5 02/11/2020   ALKPHOS 65 02/11/2020   AST 26 02/11/2020   ALT 27 02/11/2020   PROT 7.5 02/11/2020   ALBUMIN 4.8 02/11/2020   CALCIUM 9.6 02/11/2020   GFRAA 112 02/11/2020    Speciality Comments: No specialty comments available.  Procedures:  No procedures performed Allergies: Penicillins, Codeine, Voltaren [diclofenac], Crestor [rosuvastatin], and Erythromycin   Assessment / Plan:     Visit Diagnoses: Chondrocalcinosis  Primary osteoarthritis of both knees  DDD (degenerative disc disease), cervical  Spondylosis of lumbar region  Plantar fasciitis,  bilateral  Fibromyalgia  Nonischemic cardiomyopathy (HCC)  Neuropathy  Renal calcinosis  History of hypertension  MGUS (monoclonal gammopathy of unknown significance)  Malignant melanoma of right upper extremity including shoulder ([place])  Orders: No orders of the defined types were placed in this encounter.  No orders of the defined types were placed in this encounter.   Face-to-face time spent with patient was *** minutes. Greater than 50% of time was spent in counseling and coordination of care.  Follow-Up Instructions: No follow-ups on file.   Ofilia Neas, PA-C  Note - This record has been created using Dragon software.  Chart creation errors have been sought, but may not always  have been located. Such creation errors do not reflect on  the standard of medical care.

## 2020-04-12 NOTE — Progress Notes (Signed)
NEUROLOGY FOLLOW UP OFFICE NOTE  Lindsey Rodriguez [id number]  HISTORY OF PRESENT ILLNESS: Lindsey Rodriguez a 54year old right-handed female with chronic neck and back pain with neuropathy, fibromyalgia, cardiomyopathy, MGUS, hypertension, plantar fasciitis, and history of kidney sones, depression and lumbosacral radiculopathy who follows up for migraines and small fiber neuropathy  UPDATE: Migraines are fairly controlled.  They are moderate to severe, last 6 hours and occur about 3 times a month.  Treats occasionally with Extra-strength Tylenol or Allergy Sinus.  If back of neck ,ice packs.   Her main concern is her ongoing low back pain radiating down the right thigh.  She is schedule for SI joint injection.  Current NSAIDS:none Current analgesics:Extra-strength Tylenol Current triptans:no Current ergotamine:no Current anti-emetic:no Current muscle relaxants:Tizanidine4mg  PRN(also for neck stiffness) Current anti-anxiolytic:lorazepam Current sleep aide:no Current Antihypertensive medications:Lisinopril, Coreg Current Antidepressant medications:fluoxetine80mg  Current Anticonvulsant medications:none Current anti-CGRP:None Current Vitamins/Herbal/Supplements:CoQ10 30mg ; 65 Fe Current Antihistamines/Decongestants:Tylenol Allergy Sinus Other therapy:no  Depression:yes; Anxiety:yes    HISTORY: Onset: Childhood Location:Varies (back of head, crown, bi-temporal, retro-orbital, band-like) Quality:Retro-orbital throbbing, band-like vice, stabbing at back of head Initial Intensity:4/10 constant, 8/10 when severe; August: Tension 6-9/10; Migraine 9/10 Aura:no Prodrome:no Associated symptoms: Nausea, photophobia, phonophobia, tunnel vision. No associated vomiting or unilateral numbness or weakness. Initial Duration:Constant but severe episodes 8 hours (with sumatriptan and naproxen); August: Tension 68min to 3 hours;  Migraine until goes to sleep Initial Frequency:Daily but severe episodes occur once a week; August: Tension 3 days per week; Migraine once every 2 weeks) Triggers/aggravating factors: Emotional stress, working at the computer; seasonal allergies. Relieving factors:Ice packand laying in dark room Activity:Cannot function at least once a week.  She has chronic neck pain status post C4-T2 fusion. Last MRI of cervical spine from 11/26/14 showed progression of degenerative disc disease at C3-4 with left paracentral protrusion contacts and deforms the left hemicord.  Past NSAIDs: Naproxen; ibuprofen Past analgesics: Tramadol, lidocaine nasal drops Past triptans: Relpax 40mg , Treximet, Maxalt, Zomig no, Zomig NS, sumatriptan 100mg , sumatriptan NS, Onzetra Xsail Past antihypertensives: Propranolol (caused hypotension), amlodipine Past antidepressants: Cymbalta, nortriptyline, venlafaxine, sertraline Past antiepileptics: Topiramate, zonisamide, Lyrica (cognitive difficulties), Depakote, gabapentin. Past anti-CGRP: Aimovig, Emgality (could not afford) Other past therapy: biofeedback  In January2017, she developed acute onset of low back pain with pain radiating down the posterior thigh, lateral lower leg and dorsum of foot to big toe on the right.There is no associated numbness or weakness.She has taken Flexeril, which is somewhat helpful, but makes her sleepy.Pain is improved but she still notes pain when she stands up. There was no preceding injury. MRI of lumbar spine from 09/26/15 showed postsurgical changes at L4-5 and L5-S1, as well as spinal stenosis at L2-3, but no at the suspected level of L5. She hand a NCV-EMG performed on 09/28/15, which showed evidence of chronic L5 radiculopathy but nothing acute. She was referred to Dr. Renie Ora of pain management, where she was prescribed baclofen and Percocet and underwent bilateral L5/S1 transforaminal lumbar epidural steroid  injections. They work but she has had recurrent pain. She underwent L3-4 fusion on 06/15/16.Due to worsening neck pain, MRI of cervical spine was performed on 12/21/2018 which demonstrated multilevel degenrative changes with mild facet disease at C2-3 and C3-4, post-surgical C4-T2 fusion with chronic deformity of right hemicord at C4-5, central and leftward protrusion at C3-4 with partial regression of disc herniation (since 2016) and slightly greater anterolisthesis related to facet arthropathy with slight left-sided cord flattening and slight left C4 foraminal narrowing. She was  referred to Dr. Payton Mccallum at [facility]. She has received two nerve blocks which were effective. Of note, she continues to have ongoing shooting pain down the legs with associated numbness and sometimes weakness.  She underwent upper cervical nerve root ablation around December 2020 which has been effective for the neck pain and occipital neuralgia.  In 2020, she also reported increased numbness and tingling in the feet (worse on the right). 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 Labs from January, including B12, Sjogren's, Sed rate, ANA, and TSH were normal. B6 was elevated at 130.6. She was advised to discontinue any B6 containing foods. Punch skin biopsy was indicative of small fiber neuropathy. CT myelogram was ordered, which revealed progressed damage at L2. She underwent bilateral epidural injection at L2 which worked for two days  She has known chronic neck pain status post C4-T2 fusion with degenerative disc disease of the cervical spine  PAST MEDICAL HISTORY: Past Medical History:  Diagnosis Date  . Anemia   . Anxiety panic attacks   [name]  . Arthritis    knees  . Cardiomyopathy, nonischemic ([place]) 04/07/2016   EF left ventricule calculated on Lexiscan stress test at 33% with global hypokinesis.   . Chronic back pain   . Chronic neck pain   . Depression   . Dysrhythmia     pvc's with cardiomyopathy  . Family history of adverse reaction to anesthesia    mother has problems waking up  . Fibromyalgia    and neuropathy of feet  . History of kidney stones    lithotripsy  03/2016  . Hyperlipidemia   . Hypertension   . Kidney stone    History  . Kidney stones h/o  . Melanoma ([place]) RUE   [name]  . MGUS (monoclonal gammopathy of unknown significance) 06/2015   no treatment just watching recheck blood work 12/2015  . Migraine    OTC med prn - stress related  . Plantar fasciitis right  . Renal stones 2008/2011/2017  . Seasonal allergies   . SVD (spontaneous vaginal delivery)    x 3  . Tendonitis of foot right    MEDICATIONS: Current Outpatient Medications on File Prior to Visit  Medication Sig Dispense Refill  . ARIPiprazole (ABILIFY) 2 MG tablet Take 2 mg by mouth daily.    . carvedilol (COREG) 6.25 MG tablet TAKE 1 TABLET BY MOUTH TWICE A DAY WITH A MEAL 180 tablet 3  . ezetimibe-simvastatin (VYTORIN) 10-20 MG tablet Take 1 tablet by mouth every other day. 90 tablet 1  . FLUoxetine (PROZAC) 40 MG capsule Take 80 mg by mouth daily.    Marland Kitchen lisinopril (ZESTRIL) 10 MG tablet Take 1 tablet (10 mg total) by mouth daily. 90 tablet 3  . LORazepam (ATIVAN) 0.5 MG tablet Take 1-2 tablets (0.5-1 mg total) by mouth 3 (three) times daily as needed for anxiety. Prn anxiety 30 tablet 0  . MISC NATURAL PRODUCT OP Take 2 capsules by mouth daily. Omega-Q    . MISC NATURAL PRODUCTS PO Take 2 capsules by mouth daily. Rejuvenixx     . MISC NATURAL PRODUCTS PO Take 1 capsule by mouth 2 (two) times daily. Optimal- V    . MISC NATURAL PRODUCTS PO Take 1 capsule by mouth 2 (two) times daily. Optimal-M    . MISC NATURAL PRODUCTS PO Take 1 capsule by mouth 2 (two) times daily. MagnaCal-D    . tiZANidine (ZANAFLEX) 2 MG tablet Take 2 mg by mouth  every 6 (six) hours as needed for muscle spasms.     No current facility-administered medications on file prior to visit.     ALLERGIES: Allergies  Allergen Reactions  . Penicillins Shortness Of Breath and Rash    Has patient had a PCN reaction causing immediate rash, facial/tongue/throat swelling, SOB or lightheadedness with hypotension: no Has patient had a PCN reaction causing severe rash involving mucus membranes or skin necrosis: no Has patient had a PCN reaction that required hospitalization no Has patient had a PCN reaction occurring within the last 10 years: no If all of the above answers are "NO", then may proceed with Cephalosporin use.   . Codeine Other (See Comments)    Manic depressive  . Voltaren [Diclofenac] Nausea And Vomiting and Other (See Comments)    Pt stated "tore stomach up"  . Crestor [Rosuvastatin] Other (See Comments)    Myalgia  . Erythromycin Rash    FAMILY HISTORY: Family History  Problem Relation Age of Onset  . Hypertension Mother   . Depression Mother   . Migraines Mother   . Hypothyroidism Mother   . Hypercholesterolemia Mother   . COPD Father   . Heart failure Father 65  . Healthy Brother   . Healthy Daughter   . Healthy Son   . Healthy Daughter   . Diabetes Neg Hx     SOCIAL HISTORY: Social History   Socioeconomic History  . Marital status: Married    Spouse name: Remo Lipps  . Number of children: 3  . Years of education: Not on file  . Highest education level: Not on file  Occupational History  . Not on file  Tobacco Use  . Smoking status: Former Smoker    Packs/day: 0.10    Years: 3.00    Pack years: 0.30    Types: Cigarettes    Quit date: 07/04/1987    Years since quitting: 32.7  . Smokeless tobacco: Never Used  Vaping Use  . Vaping Use: Never used  Substance and Sexual Activity  . Alcohol use: Yes    Alcohol/week: 0.0 standard drinks    Comment: rarely   . Drug use: No  . Sexual activity: Yes    Partners: Male    Birth control/protection: Other-see comments, Surgical    Comment: husband had vasectomy; pt had hysterectomy  Other Topics  Concern  . Not on file  Social History Narrative   Married.  Lives with husband, 1 dog (and granddog)   1 daughter is getting her Masters in [place].   Son moved out 07/2018, local, 1 granddaughter from him (who lives with the mother). Son and granddaughter stay with them every other weekend.   1 daughter in [place], [place], got married 10-03-2019 (husband is from [place]).   brother-in-law (with Down's syndrome) , had a stroke, in SNF, passed away in 10-03-2019   Disabled due to anxiety.  Previously worked as Public relations account executive.   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   Working 1 day/week at [employer].      Updated 04/2020      Pt is right-handed.   Caffeine - coffee I cup in am/   Social Determinants of Health   Financial Resource Strain:   . Difficulty of Paying Living Expenses: Not on file  Food Insecurity:   . Worried About Charity fundraiser in the Last Year: Not on file  . Ran Out of Food in the Last Year: Not on file  Transportation Needs:   .  Lack of Transportation (Medical): Not on file  . Lack of Transportation (Non-Medical): Not on file  Physical Activity:   . Days of Exercise per Week: Not on file  . Minutes of Exercise per Session: Not on file  Stress:   . Feeling of Stress : Not on file  Social Connections:   . Frequency of Communication with Friends and Family: Not on file  . Frequency of Social Gatherings with Friends and Family: Not on file  . Attends Religious Services: Not on file  . Active Member of Clubs or Organizations: Not on file  . Attends Archivist Meetings: Not on file  . Marital Status: Not on file  Intimate Partner Violence:   . Fear of Current or Ex-Partner: Not on file  . Emotionally Abused: Not on file  . Physically Abused: Not on file  . Sexually Abused: Not on file    PHYSICAL EXAM: Blood pressure 94/66, pulse 62, height 5\' 6"  (1.676 m), weight 171 lb 3.2 oz (77.7 kg), last menstrual period 12/16/2012, SpO2 95 %. General: No  acute distress.  Patient appears well-groomed.   Head:  Normocephalic/atraumatic Eyes:  Fundi examined but not visualized Neck: supple, no paraspinal tenderness, full range of motion Heart:  Regular rate and rhythm Lungs:  Clear to auscultation bilaterally Back: No paraspinal tenderness Neurological Exam: alert and oriented to person, place, and time. Attention span and concentration intact, recent and remote memory intact, fund of knowledge intact.  Speech fluent and not dysarthric, language intact.  CN II-XII intact. Bulk and tone normal, muscle strength 5/5 throughout.  Sensation to light touch, temperature and vibration intact.  Deep tendon reflexes 2+ throughout, toes downgoing.  Finger to nose and heel to shin testing intact.  Gait normal, Romberg negative.  IMPRESSION: 1.  Migraine without aura, without aura, without status migrainosus, not intractable 2.  Occipital neuralgia/cervicogenic headache 3.  Small fiber neuropathy  PLAN: 1.  Tizanidine as needed. 2.  Follow up in 6 months.  Metta Clines, DO  CC: Even Tomi Bamberger, MD

## 2020-04-13 ENCOUNTER — Other Ambulatory Visit: Payer: Self-pay

## 2020-04-13 ENCOUNTER — Encounter: Payer: Self-pay | Admitting: Neurology

## 2020-04-13 ENCOUNTER — Ambulatory Visit: Payer: Medicare PPO | Admitting: Neurology

## 2020-04-13 VITALS — BP 94/66 | HR 62 | Ht 66.0 in | Wt 171.2 lb

## 2020-04-13 DIAGNOSIS — M5481 Occipital neuralgia: Secondary | ICD-10-CM | POA: Diagnosis not present

## 2020-04-13 DIAGNOSIS — G6289 Other specified polyneuropathies: Secondary | ICD-10-CM | POA: Diagnosis not present

## 2020-04-13 DIAGNOSIS — G4486 Cervicogenic headache: Secondary | ICD-10-CM | POA: Diagnosis not present

## 2020-04-13 DIAGNOSIS — G43009 Migraine without aura, not intractable, without status migrainosus: Secondary | ICD-10-CM

## 2020-04-13 MED ORDER — TIZANIDINE HCL 2 MG PO TABS: 2.0000 mg | ORAL_TABLET | Freq: Four times a day (QID) | ORAL | 5 refills | Status: DC | PRN

## 2020-04-13 NOTE — Patient Instructions (Signed)
1.  Tizanidine refilled 2.  Limit use of pain relievers to no more than 2 days out of week to prevent risk of rebound or medication-overuse headache. 3.  Keep headache diary 4.  Follow up 6 months

## 2020-04-22 ENCOUNTER — Ambulatory Visit: Payer: Medicare PPO | Admitting: Rheumatology

## 2020-04-22 DIAGNOSIS — M47816 Spondylosis without myelopathy or radiculopathy, lumbar region: Secondary | ICD-10-CM

## 2020-04-22 DIAGNOSIS — I428 Other cardiomyopathies: Secondary | ICD-10-CM

## 2020-04-22 DIAGNOSIS — M1120 Other chondrocalcinosis, unspecified site: Secondary | ICD-10-CM

## 2020-04-22 DIAGNOSIS — M170 Bilateral primary osteoarthritis of knee: Secondary | ICD-10-CM

## 2020-04-22 DIAGNOSIS — M797 Fibromyalgia: Secondary | ICD-10-CM

## 2020-04-22 DIAGNOSIS — G629 Polyneuropathy, unspecified: Secondary | ICD-10-CM

## 2020-04-22 DIAGNOSIS — C4361 Malignant melanoma of right upper limb, including shoulder: Secondary | ICD-10-CM

## 2020-04-22 DIAGNOSIS — D472 Monoclonal gammopathy: Secondary | ICD-10-CM

## 2020-04-22 DIAGNOSIS — M5030 Other cervical disc degeneration, unspecified cervical region: Secondary | ICD-10-CM

## 2020-04-22 DIAGNOSIS — Z8679 Personal history of other diseases of the circulatory system: Secondary | ICD-10-CM

## 2020-04-22 DIAGNOSIS — M722 Plantar fascial fibromatosis: Secondary | ICD-10-CM

## 2020-04-28 DIAGNOSIS — G8929 Other chronic pain: Secondary | ICD-10-CM | POA: Diagnosis not present

## 2020-04-28 DIAGNOSIS — M533 Sacrococcygeal disorders, not elsewhere classified: Secondary | ICD-10-CM | POA: Diagnosis not present

## 2020-05-17 ENCOUNTER — Other Ambulatory Visit: Payer: Self-pay | Admitting: Family Medicine

## 2020-05-17 DIAGNOSIS — Z1231 Encounter for screening mammogram for malignant neoplasm of breast: Secondary | ICD-10-CM

## 2020-06-09 DIAGNOSIS — G8929 Other chronic pain: Secondary | ICD-10-CM | POA: Diagnosis not present

## 2020-06-09 DIAGNOSIS — M545 Low back pain, unspecified: Secondary | ICD-10-CM | POA: Diagnosis not present

## 2020-06-09 DIAGNOSIS — M4807 Spinal stenosis, lumbosacral region: Secondary | ICD-10-CM | POA: Diagnosis not present

## 2020-06-10 ENCOUNTER — Other Ambulatory Visit: Payer: Self-pay | Admitting: Physician Assistant

## 2020-06-10 DIAGNOSIS — M545 Low back pain, unspecified: Secondary | ICD-10-CM

## 2020-06-10 DIAGNOSIS — G8929 Other chronic pain: Secondary | ICD-10-CM

## 2020-06-10 DIAGNOSIS — M549 Dorsalgia, unspecified: Secondary | ICD-10-CM

## 2020-06-11 ENCOUNTER — Telehealth: Payer: Self-pay

## 2020-06-11 NOTE — Telephone Encounter (Signed)
Phone call to patient to verify medication list and allergies for myelogram procedure. Pt instructed to hold Prozac for 48hrs prior to myelogram appointment time and 24 hours after appointment. Pt also instructed to have a driver the day of the procedure, the procedure would take around 2 hours, and discharge instructions discussed. Pt verbalized understanding.   

## 2020-06-15 ENCOUNTER — Ambulatory Visit
Admission: RE | Admit: 2020-06-15 | Discharge: 2020-06-15 | Disposition: A | Discharge status: Home / Self Care | Payer: Medicare PPO | Source: Ambulatory Visit | Attending: Physician Assistant | Admitting: Physician Assistant

## 2020-06-15 ENCOUNTER — Other Ambulatory Visit: Payer: Self-pay

## 2020-06-15 DIAGNOSIS — G8929 Other chronic pain: Secondary | ICD-10-CM

## 2020-06-15 DIAGNOSIS — M549 Dorsalgia, unspecified: Secondary | ICD-10-CM

## 2020-06-15 DIAGNOSIS — M4326 Fusion of spine, lumbar region: Secondary | ICD-10-CM | POA: Diagnosis not present

## 2020-06-15 DIAGNOSIS — M5136 Other intervertebral disc degeneration, lumbar region: Secondary | ICD-10-CM | POA: Diagnosis not present

## 2020-06-15 DIAGNOSIS — M47816 Spondylosis without myelopathy or radiculopathy, lumbar region: Secondary | ICD-10-CM | POA: Diagnosis not present

## 2020-06-15 DIAGNOSIS — M5124 Other intervertebral disc displacement, thoracic region: Secondary | ICD-10-CM | POA: Diagnosis not present

## 2020-06-15 DIAGNOSIS — M545 Low back pain, unspecified: Secondary | ICD-10-CM

## 2020-06-15 DIAGNOSIS — M5126 Other intervertebral disc displacement, lumbar region: Secondary | ICD-10-CM | POA: Diagnosis not present

## 2020-06-15 DIAGNOSIS — M2578 Osteophyte, vertebrae: Secondary | ICD-10-CM | POA: Diagnosis not present

## 2020-06-15 MED ORDER — ONDANSETRON HCL 4 MG/2ML IJ SOLN
4.0000 mg | Freq: Once | INTRAMUSCULAR | Status: AC
  Administered 2020-06-15: 4 mg via INTRAMUSCULAR

## 2020-06-15 MED ORDER — MEPERIDINE HCL 50 MG/ML IJ SOLN
50.0000 mg | Freq: Once | INTRAMUSCULAR | Status: AC
  Administered 2020-06-15: 50 mg via INTRAMUSCULAR

## 2020-06-15 MED ORDER — IOPAMIDOL (ISOVUE-M 300) INJECTION 61%
10.0000 mL | Freq: Once | INTRAMUSCULAR | Status: AC | PRN
  Administered 2020-06-15: 10 mL via INTRATHECAL

## 2020-06-15 MED ORDER — DIAZEPAM 5 MG PO TABS
10.0000 mg | ORAL_TABLET | Freq: Once | ORAL | Status: AC
  Administered 2020-06-15: 10 mg via ORAL

## 2020-06-15 NOTE — Discharge Instructions (Signed)
Myelogram Discharge Instructions  1. Go home and rest quietly for the next 24 hours.  It is important to lie flat for the next 24 hours.  Get up only to go to the restroom.  You may lie in the bed or on a couch on your back, your stomach, your left side or your right side.  You may have one pillow under your head.  You may have pillows between your knees while you are on your side or under your knees while you are on your back.  2. DO NOT drive today.  Recline the seat as far back as it will go, while still wearing your seat belt, on the way home.  3. You may get up to go to the bathroom as needed.  You may sit up for 10 minutes to eat.  You may resume your normal diet and medications unless otherwise indicated.  Drink lots of extra fluids today and tomorrow.  4. The incidence of headache, nausea, or vomiting is about 5% (one in 20 patients).  If you develop a headache, lie flat and drink plenty of fluids until the headache goes away.  Caffeinated beverages may be helpful.  If you develop severe nausea and vomiting or a headache that does not go away with flat bed rest, call [phone].  5. You may resume normal activities after your 24 hours of bed rest is over; however, do not exert yourself strongly or do any heavy lifting tomorrow. If when you get up you have a headache when standing, go back to bed and force fluids for another 24 hours.  6. Call your physician for a follow-up appointment.  The results of your myelogram will be sent directly to your physician by the following day.  7. If you have any questions or if complications develop after you arrive home, please call [phone].  Discharge instructions have been explained to the patient.  The patient, or the person responsible for the patient, fully understands these instructions  You may take your Prozac on 06/16/20 @ 9:30am which is tomorrow.

## 2020-06-15 NOTE — Progress Notes (Addendum)
Pt reports she has been off of Prozac for 48 hours and verbalizes understanding not to restart this medication until tomorrow 06/16/20 @ 9:30am.

## 2020-06-29 ENCOUNTER — Other Ambulatory Visit: Payer: Self-pay

## 2020-06-29 ENCOUNTER — Ambulatory Visit
Admission: RE | Admit: 2020-06-29 | Discharge: 2020-06-29 | Disposition: A | Discharge status: Home / Self Care | Payer: Medicare PPO | Source: Ambulatory Visit | Attending: Family Medicine | Admitting: Family Medicine

## 2020-06-29 DIAGNOSIS — Z1231 Encounter for screening mammogram for malignant neoplasm of breast: Secondary | ICD-10-CM | POA: Diagnosis not present

## 2020-07-07 DIAGNOSIS — G8929 Other chronic pain: Secondary | ICD-10-CM | POA: Diagnosis not present

## 2020-07-07 DIAGNOSIS — L578 Other skin changes due to chronic exposure to nonionizing radiation: Secondary | ICD-10-CM | POA: Diagnosis not present

## 2020-07-07 DIAGNOSIS — Z86018 Personal history of other benign neoplasm: Secondary | ICD-10-CM | POA: Diagnosis not present

## 2020-07-07 DIAGNOSIS — D225 Melanocytic nevi of trunk: Secondary | ICD-10-CM | POA: Diagnosis not present

## 2020-07-07 DIAGNOSIS — B360 Pityriasis versicolor: Secondary | ICD-10-CM | POA: Diagnosis not present

## 2020-07-07 DIAGNOSIS — D2222 Melanocytic nevi of left ear and external auricular canal: Secondary | ICD-10-CM | POA: Diagnosis not present

## 2020-07-07 DIAGNOSIS — M545 Low back pain, unspecified: Secondary | ICD-10-CM | POA: Diagnosis not present

## 2020-07-07 DIAGNOSIS — Z808 Family history of malignant neoplasm of other organs or systems: Secondary | ICD-10-CM | POA: Diagnosis not present

## 2020-07-07 DIAGNOSIS — D2262 Melanocytic nevi of left upper limb, including shoulder: Secondary | ICD-10-CM | POA: Diagnosis not present

## 2020-07-07 DIAGNOSIS — M4807 Spinal stenosis, lumbosacral region: Secondary | ICD-10-CM | POA: Diagnosis not present

## 2020-07-07 DIAGNOSIS — D224 Melanocytic nevi of scalp and neck: Secondary | ICD-10-CM | POA: Diagnosis not present

## 2020-07-09 ENCOUNTER — Telehealth: Payer: Self-pay | Admitting: Hematology

## 2020-07-09 NOTE — Telephone Encounter (Signed)
Called pt per 1/6 sch msg - no answer. Left message for patient to call back to reschedule.

## 2020-07-14 DIAGNOSIS — I428 Other cardiomyopathies: Secondary | ICD-10-CM | POA: Diagnosis not present

## 2020-07-14 DIAGNOSIS — I10 Essential (primary) hypertension: Secondary | ICD-10-CM | POA: Diagnosis not present

## 2020-07-14 DIAGNOSIS — I447 Left bundle-branch block, unspecified: Secondary | ICD-10-CM | POA: Diagnosis not present

## 2020-07-14 DIAGNOSIS — D472 Monoclonal gammopathy: Secondary | ICD-10-CM | POA: Diagnosis not present

## 2020-07-14 DIAGNOSIS — Z79899 Other long term (current) drug therapy: Secondary | ICD-10-CM | POA: Diagnosis not present

## 2020-07-14 DIAGNOSIS — Z01818 Encounter for other preprocedural examination: Secondary | ICD-10-CM | POA: Diagnosis not present

## 2020-07-18 DIAGNOSIS — Z20822 Contact with and (suspected) exposure to covid-19: Secondary | ICD-10-CM | POA: Diagnosis not present

## 2020-07-20 ENCOUNTER — Other Ambulatory Visit: Payer: Medicare PPO

## 2020-07-20 DIAGNOSIS — G8918 Other acute postprocedural pain: Secondary | ICD-10-CM | POA: Diagnosis not present

## 2020-07-20 DIAGNOSIS — I340 Nonrheumatic mitral (valve) insufficiency: Secondary | ICD-10-CM | POA: Diagnosis not present

## 2020-07-20 DIAGNOSIS — M519 Unspecified thoracic, thoracolumbar and lumbosacral intervertebral disc disorder: Secondary | ICD-10-CM | POA: Diagnosis not present

## 2020-07-20 DIAGNOSIS — I10 Essential (primary) hypertension: Secondary | ICD-10-CM | POA: Diagnosis not present

## 2020-07-20 DIAGNOSIS — I110 Hypertensive heart disease with heart failure: Secondary | ICD-10-CM | POA: Diagnosis not present

## 2020-07-20 DIAGNOSIS — I428 Other cardiomyopathies: Secondary | ICD-10-CM | POA: Diagnosis not present

## 2020-07-20 DIAGNOSIS — I5022 Chronic systolic (congestive) heart failure: Secondary | ICD-10-CM | POA: Diagnosis not present

## 2020-07-20 DIAGNOSIS — E871 Hypo-osmolality and hyponatremia: Secondary | ICD-10-CM | POA: Diagnosis not present

## 2020-07-20 DIAGNOSIS — M797 Fibromyalgia: Secondary | ICD-10-CM | POA: Diagnosis not present

## 2020-07-20 DIAGNOSIS — Z981 Arthrodesis status: Secondary | ICD-10-CM | POA: Diagnosis not present

## 2020-07-20 DIAGNOSIS — M4316 Spondylolisthesis, lumbar region: Secondary | ICD-10-CM | POA: Diagnosis not present

## 2020-07-20 DIAGNOSIS — M5136 Other intervertebral disc degeneration, lumbar region: Secondary | ICD-10-CM | POA: Diagnosis not present

## 2020-07-20 DIAGNOSIS — F32A Depression, unspecified: Secondary | ICD-10-CM | POA: Diagnosis not present

## 2020-07-20 HISTORY — PX: BACK SURGERY: SHX140

## 2020-07-27 ENCOUNTER — Ambulatory Visit: Payer: Medicare PPO | Admitting: Hematology

## 2020-08-31 ENCOUNTER — Inpatient Hospital Stay: Payer: Medicare PPO | Attending: Hematology

## 2020-08-31 ENCOUNTER — Other Ambulatory Visit: Payer: Self-pay

## 2020-08-31 DIAGNOSIS — D472 Monoclonal gammopathy: Secondary | ICD-10-CM | POA: Diagnosis not present

## 2020-08-31 LAB — CBC WITH DIFFERENTIAL/PLATELET
Abs Immature Granulocytes: 0.01 10*3/uL (ref 0.00–0.07)
Basophils Absolute: 0 10*3/uL (ref 0.0–0.1)
Basophils Relative: 1 %
Eosinophils Absolute: 0 10*3/uL (ref 0.0–0.5)
Eosinophils Relative: 1 %
HCT: 38.2 % (ref 36.0–46.0)
Hemoglobin: 12.7 g/dL (ref 12.0–15.0)
Immature Granulocytes: 0 %
Lymphocytes Relative: 40 %
Lymphs Abs: 1.3 10*3/uL (ref 0.7–4.0)
MCH: 33.5 pg (ref 26.0–34.0)
MCHC: 33.2 g/dL (ref 30.0–36.0)
MCV: 100.8 fL — ABNORMAL HIGH (ref 80.0–100.0)
Monocytes Absolute: 0.3 10*3/uL (ref 0.1–1.0)
Monocytes Relative: 9 %
Neutro Abs: 1.6 10*3/uL — ABNORMAL LOW (ref 1.7–7.7)
Neutrophils Relative %: 49 %
Platelets: 262 10*3/uL (ref 150–400)
RBC: 3.79 MIL/uL — ABNORMAL LOW (ref 3.87–5.11)
RDW: 12 % (ref 11.5–15.5)
WBC: 3.3 10*3/uL — ABNORMAL LOW (ref 4.0–10.5)
nRBC: 0 % (ref 0.0–0.2)

## 2020-08-31 LAB — CMP (CANCER CENTER ONLY)
ALT: 39 U/L (ref 0–44)
AST: 30 U/L (ref 15–41)
Albumin: 4.8 g/dL (ref 3.5–5.0)
Alkaline Phosphatase: 73 U/L (ref 38–126)
Anion gap: 10 (ref 5–15)
BUN: 18 mg/dL (ref 6–20)
CO2: 25 mmol/L (ref 22–32)
Calcium: 10 mg/dL (ref 8.9–10.3)
Chloride: 104 mmol/L (ref 98–111)
Creatinine: 0.88 mg/dL (ref 0.44–1.00)
GFR, Estimated: >60 mL/min (ref >60)
Glucose, Bld: 91 mg/dL (ref 70–99)
Potassium: 4.5 mmol/L (ref 3.5–5.1)
Sodium: 139 mmol/L (ref 135–145)
Total Bilirubin: 0.5 mg/dL (ref 0.3–1.2)
Total Protein: 8.5 g/dL — ABNORMAL HIGH (ref 6.5–8.1)

## 2020-09-01 DIAGNOSIS — M5136 Other intervertebral disc degeneration, lumbar region: Secondary | ICD-10-CM | POA: Diagnosis not present

## 2020-09-01 DIAGNOSIS — M4316 Spondylolisthesis, lumbar region: Secondary | ICD-10-CM | POA: Diagnosis not present

## 2020-09-01 DIAGNOSIS — M48061 Spinal stenosis, lumbar region without neurogenic claudication: Secondary | ICD-10-CM | POA: Diagnosis not present

## 2020-09-01 DIAGNOSIS — M4317 Spondylolisthesis, lumbosacral region: Secondary | ICD-10-CM | POA: Diagnosis not present

## 2020-09-01 DIAGNOSIS — M21161 Varus deformity, not elsewhere classified, right knee: Secondary | ICD-10-CM | POA: Diagnosis not present

## 2020-09-01 DIAGNOSIS — M4807 Spinal stenosis, lumbosacral region: Secondary | ICD-10-CM | POA: Diagnosis not present

## 2020-09-01 DIAGNOSIS — M21162 Varus deformity, not elsewhere classified, left knee: Secondary | ICD-10-CM | POA: Diagnosis not present

## 2020-09-01 DIAGNOSIS — M5134 Other intervertebral disc degeneration, thoracic region: Secondary | ICD-10-CM | POA: Diagnosis not present

## 2020-09-06 LAB — MULTIPLE MYELOMA PANEL, SERUM
Albumin SerPl Elph-Mcnc: 4.5 g/dL — ABNORMAL HIGH (ref 2.9–4.4)
Albumin/Glob SerPl: 1.3 (ref 0.7–1.7)
Alpha 1: 0.1 g/dL (ref 0.0–0.4)
Alpha2 Glob SerPl Elph-Mcnc: 0.8 g/dL (ref 0.4–1.0)
B-Globulin SerPl Elph-Mcnc: 1.1 g/dL (ref 0.7–1.3)
Gamma Glob SerPl Elph-Mcnc: 1.6 g/dL (ref 0.4–1.8)
Globulin, Total: 3.6 g/dL (ref 2.2–3.9)
IgA: 57 mg/dL — ABNORMAL LOW (ref 87–352)
IgG (Immunoglobin G), Serum: 1647 mg/dL — ABNORMAL HIGH (ref 586–1602)
IgM (Immunoglobulin M), Srm: 45 mg/dL (ref 26–217)
M Protein SerPl Elph-Mcnc: 1.3 g/dL — ABNORMAL HIGH
Total Protein ELP: 8.1 g/dL (ref 6.0–8.5)

## 2020-09-06 NOTE — Progress Notes (Signed)
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 Skeletal survey negative. Bone marrow deferred as per patient choice.  Current Treatment: observation  INTERVAL HISTORY: Lindsey Rodriguez is here for her scheduled 6 month follow-up for IgG lambda MGUS. The patient's last visit with [place] was on 01/15/20. The pt reports that she is doing well overall. We are joined today by her husband.  The pt reports that she had back surgery (fusion of L2/L3) in January. She is recovered and about to start PT. The pt notes this has helped with her pain. She notes no new symptoms or concerns. The pt notes her neuropathy has worsened, but they were waiting until after the surgery to adjust medication. The pt notes no improvement in this post-surgery. The pt notes no new medication changes. She notes her energy levels are fluctuating. The pt notes there was a concern with her sodium levels dropping very low following surgery. The pt notes no new concerning skin spots.  Lab results 08/31/2020 of CBC w/diff and CMP is as follows: all values are WNL except for WBC of 3.3K, RBC of 3.79, MCV of 100.8, Neutro Abs of 1.6K, Total Protein of 8.5. 08/31/2020 MMP WNL except IgG of 1,647, IgA of 57, Albumin of 4.5, m-protein of 1.3.  On review of systems, pt denies new skin spots, new bone pains, abdominal pain, leg swelling, calf pain, and any other symptoms.  REVIEW OF SYSTEMS:   10 Point review of Systems was done is negative except as noted above.  Past Medical History:  Diagnosis Date  . Anemia   . Anxiety panic attacks   [name]  . Arthritis    knees  . Cardiomyopathy, nonischemic ([place])  04/07/2016   EF left ventricule calculated on Lexiscan stress test at 33% with global hypokinesis.   . Chronic back pain   . Chronic neck pain   . Depression   . Dysrhythmia    pvc's with cardiomyopathy  . Family history of adverse reaction to anesthesia    mother has problems waking up  . Fibromyalgia    and neuropathy of feet  . History of kidney stones    lithotripsy  03/2016  . Hyperlipidemia   . Hypertension   . Kidney stone    History  . Kidney stones h/o  . Melanoma ([place]) RUE   [name]  . MGUS (monoclonal gammopathy of unknown significance) 06/2015   no treatment just watching recheck blood work 12/2015  . Migraine    OTC med prn - stress related  . Plantar fasciitis right  . Renal stones 2008/2011/2017  . Seasonal allergies   . SVD (spontaneous vaginal delivery)    x 3  . Tendonitis of foot right    Past Surgical History:  Procedure Laterality Date  . ABDOMINAL HYSTERECTOMY    . ABLATION ON ENDOMETRIOSIS N/A 11/24/2015   Procedure: ABLATION ON ENDOMETRIOSIS;  Surgeon: [name], MD;  Location: [facility];  Service: Gynecology;  Laterality: N/A;  . ANTERIOR LAT LUMBAR FUSION Right 06/15/2016   Procedure: RIGHT LUMBAR THREE-FOUR ANTEROLATERAL LUMBAR INTERBODY FUSION WITH LATERAL PLATE;  Surgeon: Erline Levine, MD;  Location: [facility];  Service: Neurosurgery;  Laterality: Right;  . BACK SURGERY  Dr.Stern, last sx Summer 2011   X 4  (L3-S1)  . BACK SURGERY  06/2016  . BLADDER SUSPENSION N/A 11/24/2015   Procedure: TRANSVAGINAL TAPE (TVT) PROCEDURE;  Surgeon: [name], MD;  Location: [facility];  Service: Gynecology;  Laterality: N/A;  . C2-3 nerve ablation Right 06/2019   Dr. Maia Petties (per pt)  . COLONOSCOPY    . CYSTOCELE REPAIR  11/24/2015   Procedure: ANTERIOR REPAIR (CYSTOCELE);  Surgeon: [name], MD;  Location: [facility];  Service: Gynecology;;  . Consuela Mimes N/A 11/24/2015   Procedure: CYSTOSCOPY;  Surgeon: [name], MD;  Location: [facility];   Service: Gynecology;  Laterality: N/A;  . DILATION AND CURETTAGE OF UTERUS    . ENDOMETRIAL ABLATION  08/2000  . EYE SURGERY Bilateral    Lasik   . KIDNEY STONE SURGERY  2011  . LITHOTRIPSY  08/2017 (L), 03/2018 (R)  . MELANOMA EXCISION  05/2015   right forearm; [name]  . NECK SURGERY  [name]  Summer 2011   X 4  (C4-T1 Level)  . RHINOPLASTY    . ROBOTIC ASSISTED BILATERAL SALPINGO OOPHERECTOMY Bilateral 11/24/2015   Procedure: ROBOTIC ASSISTED BILATERAL SALPINGO OOPHORECTOMY;  Surgeon: [name], MD;  Location: [facility];  Service: Gynecology;  Laterality: Bilateral;--OVARIES NOT REMOVED PER PATH REPORT  . ROBOTIC ASSISTED TOTAL HYSTERECTOMY WITH SALPINGECTOMY Bilateral 11/24/2015   Procedure: ROBOTIC ASSISTED TOTAL HYSTERECTOMY WITH SALPINGECTOMY;  Surgeon: [name], MD;  Location: [facility];  Service: Gynecology;  Laterality: Bilateral;  . TONSILLECTOMY    . WISDOM TOOTH EXTRACTION      Social History   Tobacco Use  . Smoking status: Former Smoker    Packs/day: 0.10    Years: 3.00    Pack years: 0.30    Types: Cigarettes    Quit date: 07/04/1987    Years since quitting: 33.2  . Smokeless tobacco: Never Used  Vaping Use  . Vaping Use: Never used  Substance Use Topics  . Alcohol use: Yes    Alcohol/week: 0.0 standard drinks    Comment: rarely   . Drug use: No   ALLERGIES:  is allergic to penicillins, codeine, voltaren [diclofenac], crestor [rosuvastatin], and erythromycin.  MEDICATIONS:  Current Outpatient Medications  Medication Sig Dispense Refill  . carvedilol (COREG) 6.25 MG tablet TAKE 1 TABLET BY MOUTH TWICE A DAY WITH A MEAL 180 tablet 3  . ezetimibe-simvastatin (VYTORIN) 10-20 MG tablet Take 1 tablet by mouth every other day. 90 tablet 1  . FLUoxetine (PROZAC) 40 MG capsule Take 80 mg by mouth daily.    Marland Kitchen lisinopril (ZESTRIL) 10 MG tablet Take 1 tablet (10 mg total) by mouth daily. 90 tablet 3  . LORazepam (ATIVAN) 0.5 MG tablet Take 1-2 tablets (0.5-1  mg total) by mouth 3 (three) times daily as needed for anxiety. Prn anxiety 30 tablet 0  . MISC NATURAL PRODUCT OP Take 2 capsules by mouth daily. Omega-Q    . MISC NATURAL PRODUCTS PO Take 2 capsules by mouth daily. Rejuvenixx    . MISC NATURAL PRODUCTS PO Take 1 capsule by mouth 2 (two) times daily. Optimal- V    . MISC NATURAL PRODUCTS PO Take 1 capsule by mouth 2 (two) times daily. Optimal-M    . MISC NATURAL PRODUCTS PO Take 1 capsule by mouth 2 (two) times daily. MagnaCal-D    . tiZANidine (ZANAFLEX) 2 MG tablet Take 1 tablet (2 mg total) by mouth every 6 (six) hours as needed for muscle spasms. 30 tablet 5  No current facility-administered medications for this visit.    PHYSICAL EXAMINATION: ECOG PERFORMANCE STATUS: 2  Vitals:   09/07/20 1036  BP: 131/69  Pulse: 64  Resp: 18  Temp: (!) 97.4 F (36.3 C)  SpO2: 100%    Filed Weights   09/07/20 1036  Weight: 170 lb 9.6 oz (77.4 kg)   .Body mass index is 27.54 kg/m.  GENERAL:alert, in no acute distress and comfortable SKIN: no acute rashes, no significant lesions EYES: conjunctiva are pink and non-injected, sclera anicteric OROPHARYNX: MMM, no exudates, no oropharyngeal erythema or ulceration NECK: supple, no JVD LYMPH:  no palpable lymphadenopathy in the cervical, axillary or inguinal regions LUNGS: clear to auscultation b/l with normal respiratory effort HEART: regular rate & rhythm ABDOMEN:  normoactive bowel sounds , non tender, not distended. Extremity: no pedal edema PSYCH: alert & oriented x 3 with fluent speech NEURO: no focal motor/sensory deficits  LABORATORY DATA:   I have reviewed the data as listed  . CBC Latest Ref Rng & Units 08/31/2020 01/15/2020 06/23/2019  WBC 4.0 - 10.5 K/uL 3.3(L) 5.2 5.6  Hemoglobin 12.0 - 15.0 g/dL 12.7 13.1 13.3  Hematocrit 36.0 - 46.0 % 38.2 38.2 39.3  Platelets 150 - 400 K/uL 262 226 259    . CMP Latest Ref Rng & Units 08/31/2020 02/11/2020 01/15/2020  Glucose 70 - 99  mg/dL 91 86 82  BUN 6 - 20 mg/dL _0 Creatinine 0.44 - 1.00 mg/dL 0.88 0.71 0.85  Sodium 135 - 145 mmol/L 139 134 138  Potassium 3.5 - 5.1 mmol/L 4.5 5.0 4.5  Chloride 98 - 111 mmol/L 104 99 102  CO2 22 - 32 mmol/L _1 Calcium 8.9 - 10.3 mg/dL 10.0 9.6 9.7  Total Protein 6.5 - 8.1 g/dL 8.5(H) 7.5 7.9  Total Bilirubin 0.3 - 1.2 mg/dL 0.5 0.5 0.7  Alkaline Phos 38 - 126 U/L 73 65 53  AST 15 - 41 U/L _2 ALT 0 - 44 U/L 39 27 35        RADIOGRAPHIC STUDIES: I have personally reviewed the radiological images as listed and agreed with the findings in the report. No results found.  ASSESSMENT & PLAN:   55 y.o. 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  Previous UPEP and skeletal survey negative. Patient has no evidence of anemia, renal failure on labs. No hypercalcemia. No new bone pain. SPEP shows stable M-spike at 1g/dl with no significant increase over the last couple of follow-ups. SFLC ratio within normal limits -12/21/18 MRI Cervical spine which did not reveal any overt indication/concern for bone tumors. Continue follow up with Orthopedics and neurology.  PLAN: -Discussed pt labwork, 08/31/2020; slightly higher m protein of 1.3, chemistries stable, blood counts fairly normal, no anemia. Normal sodium levels. -Advised pt that newly formed RBC are typically larger and would be a result of her recent surgery and body recovering. -Advised pt that stimulation of immune system can cause elevation in m protein and blood counts. -Pt still does not currently fit the CRAB criteria: no hypercalcemia, no renal insufficiency, no anemia, no identified bone lesions. -The pt shows no clinical or lab progression of her monoclonal paraproteinemia at this time.  -No indication for further treatment at this time.  -Recommended pt  stay as physically active as possible. Drink 48-64 oz water daily and continue to  eat healthy and de-stress. -Will continue to monitor patient clinically with labs every 6 months at this time, and no overt indication to pursue a BM Bx. -Advised pt we will observe next m protein levels and determine if need for Bm Bx if another increase evident. -Will see the pt back in 6 months with labs one week prior.     2) Systolic nonischemic CHF patient previously noted improvement in her EF from 33% to 45%. She subsequently noted her EF normalized to 55% as of 06/19/18 clinic visit.  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 Plan -Continue management per dermatology and ongoing skin surveillance. -continue sun protection.  4) Peripheral radiculopathy /neuropathy -Is currently followed by Vertell Limber of Neurosurgery as well as Dr Tomi Likens of Neurology who have begun to work this up further. -Most recent electromyography showed evidence of radiculopathy, mild in degree, and without evidence of sensorimotor polyneuropathy.   Cannot r/o small fiber neuropathy -  Evidence of this on epidermal nerve fiber density analysis. Plan -Mx per neurology  Continue followup with primary care physician, rheumatology, cardiology, and neurology.      FOLLOW UP: RTC with Dr Irene Limbo with labs in 6 months. Plz schedule labs 1 week prior to clinic visit    The total time spent in the appointment was 20 minutes and more than 50% was on counseling and direct patient cares.   All of the patient's questions were answered with apparent satisfaction. The patient knows to call the clinic with any problems, questions or concerns.  Sullivan Lone MD [place] AAHIVMS [facility] Hematology/Oncology Physician [facility]  (Office):        [phone] (Work cell):  [phone] (Fax):           [phone]  I, Reinaldo Raddle, am acting as scribe for Dr. Sullivan Lone, MD.     .I have reviewed the above documentation for accuracy and completeness, and I agree with the above. Brunetta Genera MD

## 2020-09-07 ENCOUNTER — Other Ambulatory Visit: Payer: Self-pay

## 2020-09-07 ENCOUNTER — Inpatient Hospital Stay: Payer: Medicare PPO | Admitting: Hematology

## 2020-09-07 VITALS — BP 131/69 | HR 64 | Temp 97.4°F | Resp 18 | Ht 66.0 in | Wt 170.6 lb

## 2020-09-07 DIAGNOSIS — D472 Monoclonal gammopathy: Secondary | ICD-10-CM

## 2020-09-07 NOTE — Patient Instructions (Signed)
Thank you for choosing [facility] to provide your oncology and hematology care.   Should you have questions after your visit to the [facility] ([facility]), please contact this office at [phone] between 8:30 AM and 4:30 PM.  Voice mails left after 4:00 PM may not be returned until the following business day.  Calls received after 4:30 PM will be answered by an off-site Nurse Triage Line.    Prescription Refills:  Please have your pharmacy contact us directly for most prescription requests.  Contact the office directly for refills of narcotics (pain medications). Allow 48-72 hours for refills.  Appointments: Please contact the [facility] scheduling department [phone] for questions regarding [facility] appointment scheduling.  Contact the schedulers with any scheduling changes so that your appointment can be rescheduled in a timely manner.   Central Scheduling for [place] [phone] - Call to schedule procedures such as PET scans, CT scans, MRI, Ultrasound, etc.  To afford each patient quality time with our providers, please arrive 30 minutes before your scheduled appointment time.  If you arrive late for your appointment, you may be asked to reschedule.  We strive to give you quality time with our providers, and arriving late affects you and other patients whose appointments are after yours. If you are a no show for multiple scheduled visits, you may be dismissed from the clinic at the providers discretion.     Resources: [facility] Social Workers [phone] for additional information on assistance programs or assistance connecting with community support programs   [place] DSS  [phone]: Information regarding food stamps, Medicaid, and utility assistance [place] [employer] [place] 336-333-6589   [place] [employer]'s shared-ride transportation service for eligible riders who have a disability that prevents them from riding the fixed route bus.   Medicare  Rights Center 800-333-4114 Helps people with Medicare understand their rights and benefits, navigate the Medicare system, and secure the quality healthcare they deserve [employer] 800-227-2345 Assists patients locate various types of support and financial assistance Cancer Care: 1-800-813-HOPE (4673) Provides financial assistance, online support groups, medication/co-pay assistance.   Transportation Assistance for appointments at [facility]: Transportation Coordinator [phone]  Again, thank you for choosing [facility] for your care.       

## 2020-09-13 DIAGNOSIS — M545 Low back pain, unspecified: Secondary | ICD-10-CM | POA: Diagnosis not present

## 2020-09-13 DIAGNOSIS — Z981 Arthrodesis status: Secondary | ICD-10-CM | POA: Diagnosis not present

## 2020-09-15 DIAGNOSIS — M545 Low back pain, unspecified: Secondary | ICD-10-CM | POA: Diagnosis not present

## 2020-09-15 DIAGNOSIS — Z981 Arthrodesis status: Secondary | ICD-10-CM | POA: Diagnosis not present

## 2020-09-20 DIAGNOSIS — M545 Low back pain, unspecified: Secondary | ICD-10-CM | POA: Diagnosis not present

## 2020-09-20 DIAGNOSIS — Z981 Arthrodesis status: Secondary | ICD-10-CM | POA: Diagnosis not present

## 2020-09-22 DIAGNOSIS — M545 Low back pain, unspecified: Secondary | ICD-10-CM | POA: Diagnosis not present

## 2020-09-22 DIAGNOSIS — Z981 Arthrodesis status: Secondary | ICD-10-CM | POA: Diagnosis not present

## 2020-09-27 DIAGNOSIS — M545 Low back pain, unspecified: Secondary | ICD-10-CM | POA: Diagnosis not present

## 2020-09-27 DIAGNOSIS — Z981 Arthrodesis status: Secondary | ICD-10-CM | POA: Diagnosis not present

## 2020-09-29 ENCOUNTER — Telehealth: Payer: Self-pay

## 2020-09-29 DIAGNOSIS — Z981 Arthrodesis status: Secondary | ICD-10-CM | POA: Diagnosis not present

## 2020-09-29 DIAGNOSIS — M545 Low back pain, unspecified: Secondary | ICD-10-CM | POA: Diagnosis not present

## 2020-09-29 NOTE — Telephone Encounter (Signed)
LVM for pt to call back and reschedule her 04/06/21 appt with Dr. Tomi Bamberger. The dates provided by Dr. Tomi Bamberger was 04/07/21 or 04/14/21. [place]

## 2020-10-04 ENCOUNTER — Other Ambulatory Visit: Payer: Self-pay

## 2020-10-04 ENCOUNTER — Ambulatory Visit: Payer: Medicare PPO

## 2020-10-04 DIAGNOSIS — I447 Left bundle-branch block, unspecified: Secondary | ICD-10-CM | POA: Diagnosis not present

## 2020-10-04 DIAGNOSIS — I428 Other cardiomyopathies: Secondary | ICD-10-CM | POA: Diagnosis not present

## 2020-10-04 DIAGNOSIS — M545 Low back pain, unspecified: Secondary | ICD-10-CM | POA: Diagnosis not present

## 2020-10-04 DIAGNOSIS — Z981 Arthrodesis status: Secondary | ICD-10-CM | POA: Diagnosis not present

## 2020-10-05 ENCOUNTER — Other Ambulatory Visit: Payer: Self-pay

## 2020-10-05 DIAGNOSIS — I10 Essential (primary) hypertension: Secondary | ICD-10-CM

## 2020-10-05 MED ORDER — CARVEDILOL 6.25 MG PO TABS: ORAL_TABLET | ORAL | 0 refills | Status: DC

## 2020-10-06 DIAGNOSIS — M545 Low back pain, unspecified: Secondary | ICD-10-CM | POA: Diagnosis not present

## 2020-10-06 DIAGNOSIS — Z981 Arthrodesis status: Secondary | ICD-10-CM | POA: Diagnosis not present

## 2020-10-11 ENCOUNTER — Ambulatory Visit: Payer: Medicare PPO | Admitting: Cardiology

## 2020-10-11 DIAGNOSIS — M545 Low back pain, unspecified: Secondary | ICD-10-CM | POA: Diagnosis not present

## 2020-10-11 DIAGNOSIS — Z981 Arthrodesis status: Secondary | ICD-10-CM | POA: Diagnosis not present

## 2020-10-11 NOTE — Progress Notes (Addendum)
NEUROLOGY FOLLOW UP OFFICE NOTE  Lindsey Rodriguez [id number]  Assessment/Plan:   1.  Migraine without aura, without status migrainosus, not intractable 2.  Occipital neuralgia/cervicogenic headache, stable 3.  Chronic low back pain with right lumbar radiculitis - ongoing. Failed neuroleptic medications, injections and surgery - may need to consider returning to pain specialist. 4.  Small fiber neuropathy - workup for etiology so far negative.  For sake of completeness, check ACE  1.  Tizanidine  2.  Check ACE 3.  Follow up in 9 months.  Subjective:  Lindsey Rodriguez a 55year old right-handed female with chronic neck and back pain with neuropathy, fibromyalgia, cardiomyopathy,MGUS,hypertension, plantar fasciitis, and history of kidney sones, depression and lumbosacral radiculopathy who follows up for migraines  UPDATE: Headaches are better until the pollin.  Takes allergy sinus that takes care of it.   She had back surgery in January.  She still has pain down right leg and neuropathy in feet ar worse.    Migraines are fairly controlled.  They are moderate to severe, last 6 hours and occur about 3 times a month.  Treats occasionally with Extra-strength Tylenol or Allergy Sinus.  If back of neck ,ice packs.    Current NSAIDS:none Current analgesics:Extra-strength Tylenol Current triptans:no Current ergotamine:no Current anti-emetic:no Current muscle relaxants:Tizanidine4mg PRN(also for neck stiffness) Current anti-anxiolytic:lorazepam Current sleep aide:no Current Antihypertensive medications:Lisinopril, Coreg Current Antidepressant medications:fluoxetine80mg  Current Anticonvulsant medications:none Current anti-CGRP:None Current Vitamins/Herbal/Supplements:CoQ10 30mg ; 65 Fe Current Antihistamines/Decongestants:Tylenol Allergy Sinus Other therapy:no  Depression:yes; Anxiety:yes Other pain:  Chronic low back pain - lumbar disc  disease, small fiber neuropathy   HISTORY: Onset: Childhood Location:Varies (back of head, crown, bi-temporal, retro-orbital, band-like) Quality:Retro-orbital throbbing, band-like vice, stabbing at back of head Initial Intensity:4/10 constant, 8/10 when severe; August: Tension 6-9/10; Migraine 9/10 Aura:no Prodrome:no Associated symptoms: Nausea, photophobia, phonophobia, tunnel vision. No associated vomiting or unilateral numbness or weakness. Initial Duration:Constant but severe episodes 8 hours (with sumatriptan and naproxen); August: Tension 81min to 3 hours; Migraine until goes to sleep Initial Frequency:Daily but severe episodes occur once a week; August: Tension 3 days per week; Migraine once every 2 weeks) Triggers/aggravating factors: Emotional stress, working at the computer; seasonal allergies. Relieving factors:Ice packand laying in dark room Activity:Cannot function at least once a week.  She has chronic neck pain status post C4-T2 fusion. Last MRI of cervical spine from 11/26/14 showed progression of degenerative disc disease at C3-4 with left paracentral protrusion contacts and deforms the left hemicord.  Past NSAIDs: Naproxen; ibuprofen Past analgesics: Tramadol, lidocaine nasal drops Past triptans: Relpax 40mg , Treximet, Maxalt, Zomig no, Zomig NS, sumatriptan 100mg , sumatriptan NS, Onzetra Xsail Past antihypertensives: Propranolol (caused hypotension), amlodipine Past antidepressants: Cymbalta, nortriptyline, venlafaxine, sertraline Past antiepileptics: Topiramate, zonisamide, Lyrica (cognitive difficulties), Depakote, gabapentin (sleepiness). Past anti-CGRP: Aimovig, Emgality(could not afford) Other past therapy: biofeedback  She has known chronic neck pain status post C4-T2 fusion with degenerative disc disease of the cervical spine.  Due to worsening neck pain, MRI of cervical spine was performed on 12/21/2018 which demonstrated  multilevel degenrative changes with mild facet disease at C2-3 and C3-4, post-surgical C4-T2 fusion with chronic deformity of right hemicord at C4-5, central and leftward protrusion at C3-4 with partial regression of disc herniation (since 2016) and slightly greater anterolisthesis related to facet arthropathy with slight left-sided cord flattening and slight left C4 foraminal narrowing. She was referred to Dr. Payton Mccallum at [facility]. She has received two nerve blocks which were effective. She underwent upper cervical nerve root ablation  around December 2020 which has been effective for the neck pain and occipital neuralgia.    In 2020, she also reported increased numbness and tingling in the feet (worse on the right). 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 Labs from January, including B12, Sjogren's, Sed rate, ANA, and TSH were normal. B6 was elevated at 130.6. She was advised to discontinue any B6 containing foods. Punch skin biopsy was indicative of small fiber neuropathy    PAST MEDICAL HISTORY: Past Medical History:  Diagnosis Date  . Anemia   . Anxiety panic attacks   [name]  . Arthritis    knees  . Cardiomyopathy, nonischemic ([place]) 04/07/2016   EF left ventricule calculated on Lexiscan stress test at 33% with global hypokinesis.   . Chronic back pain   . Chronic neck pain   . Depression   . Dysrhythmia    pvc's with cardiomyopathy  . Family history of adverse reaction to anesthesia    mother has problems waking up  . Fibromyalgia    and neuropathy of feet  . History of kidney stones    lithotripsy  03/2016  . Hyperlipidemia   . Hypertension   . Kidney stone    History  . Kidney stones h/o  . Melanoma ([place]) RUE   [name]  . MGUS (monoclonal gammopathy of unknown significance) 06/2015   no treatment just watching recheck blood work 12/2015  . Migraine    OTC med prn - stress related  . Plantar fasciitis right  . Renal stones 2008/2011/2017   . Seasonal allergies   . SVD (spontaneous vaginal delivery)    x 3  . Tendonitis of foot right    MEDICATIONS: Current Outpatient Medications on File Prior to Visit  Medication Sig Dispense Refill  . carvedilol (COREG) 6.25 MG tablet TAKE 1 TABLET BY MOUTH TWICE A DAY WITH A MEAL 120 tablet 0  . ezetimibe-simvastatin (VYTORIN) 10-20 MG tablet Take 1 tablet by mouth every other day. 90 tablet 1  . FLUoxetine (PROZAC) 40 MG capsule Take 80 mg by mouth daily.    Marland Kitchen lisinopril (ZESTRIL) 10 MG tablet Take 1 tablet (10 mg total) by mouth daily. 90 tablet 3  . LORazepam (ATIVAN) 0.5 MG tablet Take 1-2 tablets (0.5-1 mg total) by mouth 3 (three) times daily as needed for anxiety. Prn anxiety 30 tablet 0  . MISC NATURAL PRODUCT OP Take 2 capsules by mouth daily. Omega-Q    . MISC NATURAL PRODUCTS PO Take 2 capsules by mouth daily. Rejuvenixx    . MISC NATURAL PRODUCTS PO Take 1 capsule by mouth 2 (two) times daily. Optimal- V    . MISC NATURAL PRODUCTS PO Take 1 capsule by mouth 2 (two) times daily. Optimal-M    . MISC NATURAL PRODUCTS PO Take 1 capsule by mouth 2 (two) times daily. MagnaCal-D    . tiZANidine (ZANAFLEX) 2 MG tablet Take 1 tablet (2 mg total) by mouth every 6 (six) hours as needed for muscle spasms. 30 tablet 5   No current facility-administered medications on file prior to visit.    ALLERGIES: Allergies  Allergen Reactions  . Penicillins Shortness Of Breath and Rash    Has patient had a PCN reaction causing immediate rash, facial/tongue/throat swelling, SOB or lightheadedness with hypotension: no Has patient had a PCN reaction causing severe rash involving mucus membranes or skin necrosis: no Has patient had a PCN reaction that required hospitalization no Has patient had a PCN reaction occurring within  the last 10 years: no If all of the above answers are "NO", then may proceed with Cephalosporin use.  Other reaction(s): Unknown  . Codeine Other (See Comments)    Manic  depressive Other reaction(s): Unknown  . Voltaren [Diclofenac] Nausea And Vomiting and Other (See Comments)    Pt stated "tore stomach up"  . Crestor [Rosuvastatin] Other (See Comments)    Myalgia  . Diclofenac Sodium   . Erythromycin Rash    Other reaction(s): Unknown    FAMILY HISTORY: Family History  Problem Relation Age of Onset  . Hypertension Mother   . Depression Mother   . Migraines Mother   . Hypothyroidism Mother   . Hypercholesterolemia Mother   . COPD Father   . Heart failure Father 37  . Healthy Brother   . Healthy Daughter   . Healthy Son   . Healthy Daughter   . Diabetes Neg Hx       Objective:  Blood pressure 131/84, pulse (!) 57, resp. rate 18, height 5\' 7"  (1.702 m), weight 170 lb (77.1 kg), last menstrual period 12/16/2012, SpO2 (!) 74 %. General: No acute distress.  Patient appears well-groomed.    Metta Clines, DO  CC:  Rita Ohara, MD

## 2020-10-12 ENCOUNTER — Encounter: Payer: Self-pay | Admitting: Neurology

## 2020-10-12 ENCOUNTER — Other Ambulatory Visit: Payer: Self-pay

## 2020-10-12 ENCOUNTER — Other Ambulatory Visit (INDEPENDENT_AMBULATORY_CARE_PROVIDER_SITE_OTHER): Payer: Medicare PPO

## 2020-10-12 ENCOUNTER — Ambulatory Visit: Payer: Medicare PPO | Admitting: Neurology

## 2020-10-12 VITALS — BP 131/84 | HR 57 | Resp 18 | Ht 67.0 in | Wt 170.0 lb

## 2020-10-12 DIAGNOSIS — G4486 Cervicogenic headache: Secondary | ICD-10-CM | POA: Diagnosis not present

## 2020-10-12 DIAGNOSIS — G6289 Other specified polyneuropathies: Secondary | ICD-10-CM

## 2020-10-12 DIAGNOSIS — M5417 Radiculopathy, lumbosacral region: Secondary | ICD-10-CM | POA: Diagnosis not present

## 2020-10-12 DIAGNOSIS — M5030 Other cervical disc degeneration, unspecified cervical region: Secondary | ICD-10-CM

## 2020-10-12 DIAGNOSIS — M5481 Occipital neuralgia: Secondary | ICD-10-CM

## 2020-10-12 DIAGNOSIS — G43009 Migraine without aura, not intractable, without status migrainosus: Secondary | ICD-10-CM | POA: Diagnosis not present

## 2020-10-12 NOTE — Patient Instructions (Addendum)
Check ACE level Follow up 9 months.  Your provider has requested that you have labwork completed today. Please go to [facility] ([id number]) on the second floor of this building before leaving the office today. You do not need to check in. If you are not called within 15 minutes please check with the front desk.

## 2020-10-13 DIAGNOSIS — Z981 Arthrodesis status: Secondary | ICD-10-CM | POA: Diagnosis not present

## 2020-10-13 DIAGNOSIS — M545 Low back pain, unspecified: Secondary | ICD-10-CM | POA: Diagnosis not present

## 2020-10-13 LAB — ANGIOTENSIN CONVERTING ENZYME: Angiotensin-Converting Enzyme: 12 U/L (ref 9–67)

## 2020-10-14 ENCOUNTER — Telehealth: Payer: Self-pay

## 2020-10-14 NOTE — Telephone Encounter (Signed)
-----   Message from Pieter Partridge, DO sent at 10/14/2020  6:42 AM EDT ----- Lab normal

## 2020-10-14 NOTE — Telephone Encounter (Signed)
Called patient and informed her of normal labs. Patient verbalized understanding and had no questions or concerns.

## 2020-10-18 DIAGNOSIS — M545 Low back pain, unspecified: Secondary | ICD-10-CM | POA: Diagnosis not present

## 2020-10-18 DIAGNOSIS — Z981 Arthrodesis status: Secondary | ICD-10-CM | POA: Diagnosis not present

## 2020-10-20 DIAGNOSIS — M545 Low back pain, unspecified: Secondary | ICD-10-CM | POA: Diagnosis not present

## 2020-10-20 DIAGNOSIS — Z981 Arthrodesis status: Secondary | ICD-10-CM | POA: Diagnosis not present

## 2020-10-29 ENCOUNTER — Other Ambulatory Visit: Payer: Self-pay | Admitting: Physician Assistant

## 2020-10-29 DIAGNOSIS — M549 Dorsalgia, unspecified: Secondary | ICD-10-CM

## 2020-11-01 ENCOUNTER — Encounter: Payer: Self-pay | Admitting: Cardiology

## 2020-11-01 ENCOUNTER — Other Ambulatory Visit: Payer: Self-pay

## 2020-11-01 ENCOUNTER — Ambulatory Visit: Payer: Medicare PPO | Admitting: Cardiology

## 2020-11-01 ENCOUNTER — Telehealth: Payer: Self-pay

## 2020-11-01 VITALS — BP 124/80 | HR 72 | Temp 98.0°F | Resp 16 | Ht 67.0 in | Wt 170.6 lb

## 2020-11-01 DIAGNOSIS — E7800 Pure hypercholesterolemia, unspecified: Secondary | ICD-10-CM | POA: Diagnosis not present

## 2020-11-01 DIAGNOSIS — I428 Other cardiomyopathies: Secondary | ICD-10-CM

## 2020-11-01 DIAGNOSIS — I447 Left bundle-branch block, unspecified: Secondary | ICD-10-CM

## 2020-11-01 DIAGNOSIS — I10 Essential (primary) hypertension: Secondary | ICD-10-CM

## 2020-11-01 MED ORDER — ATORVASTATIN CALCIUM 10 MG PO TABS: 10.0000 mg | ORAL_TABLET | Freq: Every day | ORAL | 2 refills | Status: DC

## 2020-11-01 NOTE — Progress Notes (Signed)
Primary Physician/Referring:  Rita Ohara, MD  Patient ID: Lindsey Rodriguez, female    DOB: August 30, 1965, 55 y.o.   MRN: [id number]  Chief Complaint  Patient presents with  . Cardiomyopathy  . Follow-up    6 months   HPI:    Lindsey Rodriguez  is a 55 y.o.  Caucasian female, with frequent PVCs and  new onset left bundle branch block and non ischemic cardiomyopathy diagnosed in 2017 with severe LV systolic dysfunction, EF has improved with medical therapy to around 40 to 45%.  She has hypertension and hyperlipidemia.  She has also been diagnosed with IgG lambda MGUS, small fiber neuropathy, anxiety and depression, panic attacks and severe degenerative L5S1 spine disease and also fibromyalgia.   She underwent L4-S1 fusion in January 2022 and has recuperated well however he still having radicular pain and has been scheduled for myelogram.  This is her 64-month office visit.  She has not been able to tolerate Crestor in the past and with Vytorin, she is still having aches and pains and has been taking it 3 times a week  Past Medical History:  Diagnosis Date  . Anemia   . Anxiety panic attacks   [name]  . Arthritis    knees  . Cardiomyopathy, nonischemic ([place]) 04/07/2016   EF left ventricule calculated on Lexiscan stress test at 33% with global hypokinesis.   . Chronic back pain   . Chronic neck pain   . Depression   . Dysrhythmia    pvc's with cardiomyopathy  . Family history of adverse reaction to anesthesia    mother has problems waking up  . Fibromyalgia    and neuropathy of feet  . History of kidney stones    lithotripsy  03/2016  . Hyperlipidemia   . Hypertension   . Kidney stone    History  . Kidney stones h/o  . Melanoma ([place]) RUE   [name]  . MGUS (monoclonal gammopathy of unknown significance) 06/2015   no treatment just watching recheck blood work 12/2015  . Migraine    OTC med prn - stress related  . Plantar fasciitis right  . Renal stones 2008/2011/2017  .  Seasonal allergies   . SVD (spontaneous vaginal delivery)    x 3  . Tendonitis of foot right   Past Surgical History:  Procedure Laterality Date  . ABDOMINAL HYSTERECTOMY    . ABLATION ON ENDOMETRIOSIS N/A 11/24/2015   Procedure: ABLATION ON ENDOMETRIOSIS;  Surgeon: [name], MD;  Location: [facility];  Service: Gynecology;  Laterality: N/A;  . ANTERIOR LAT LUMBAR FUSION Right 06/15/2016   Procedure: RIGHT LUMBAR THREE-FOUR ANTEROLATERAL LUMBAR INTERBODY FUSION WITH LATERAL PLATE;  Surgeon: Erline Levine, MD;  Location: [facility];  Service: Neurosurgery;  Laterality: Right;  . BACK SURGERY  Dr.Stern, last sx Summer 2011   X 4  (L3-S1)  . BACK SURGERY  06/2016  . BLADDER SUSPENSION N/A 11/24/2015   Procedure: TRANSVAGINAL TAPE (TVT) PROCEDURE;  Surgeon: [name], MD;  Location: [facility];  Service: Gynecology;  Laterality: N/A;  . C2-3 nerve ablation Right 06/2019   Dr. Maia Petties (per pt)  . COLONOSCOPY    . CYSTOCELE REPAIR  11/24/2015   Procedure: ANTERIOR REPAIR (CYSTOCELE);  Surgeon: [name], MD;  Location: [facility];  Service: Gynecology;;  . Consuela Mimes N/A 11/24/2015   Procedure: CYSTOSCOPY;  Surgeon: [name], MD;  Location: [facility];  Service: Gynecology;  Laterality: N/A;  . DILATION AND CURETTAGE OF UTERUS    .  ENDOMETRIAL ABLATION  08/2000  . EYE SURGERY Bilateral    Lasik   . KIDNEY STONE SURGERY  2011  . LITHOTRIPSY  08/2017 (L), 03/2018 (R)  . MELANOMA EXCISION  05/2015   right forearm; [name]  . NECK SURGERY  [name]  Summer 2011   X 4  (C4-T1 Level)  . RHINOPLASTY    . ROBOTIC ASSISTED BILATERAL SALPINGO OOPHERECTOMY Bilateral 11/24/2015   Procedure: ROBOTIC ASSISTED BILATERAL SALPINGO OOPHORECTOMY;  Surgeon: [name], MD;  Location: [facility];  Service: Gynecology;  Laterality: Bilateral;--OVARIES NOT REMOVED PER PATH REPORT  . ROBOTIC ASSISTED TOTAL HYSTERECTOMY WITH SALPINGECTOMY Bilateral 11/24/2015   Procedure: ROBOTIC ASSISTED TOTAL HYSTERECTOMY  WITH SALPINGECTOMY;  Surgeon: [name], MD;  Location: [facility];  Service: Gynecology;  Laterality: Bilateral;  . TONSILLECTOMY    . WISDOM TOOTH EXTRACTION     Social History   Tobacco Use  . Smoking status: Former Smoker    Packs/day: 0.10    Years: 3.00    Pack years: 0.30    Types: Cigarettes    Quit date: 07/04/1987    Years since quitting: 33.3  . Smokeless tobacco: Never Used  Substance Use Topics  . Alcohol use: Yes    Alcohol/week: 0.0 standard drinks    Comment: rarely    Marital Status: Married   ROS  Review of Systems  Constitutional: Positive for malaise/fatigue.  Cardiovascular: Negative for dyspnea on exertion.  Musculoskeletal: Positive for back pain, joint pain, muscle weakness and myalgias.  Neurological: Positive for dizziness.  Psychiatric/Behavioral: Positive for depression. The patient is nervous/anxious.    Objective   Vitals with BMI 11/01/2020 10/12/2020 09/07/2020  Height 5\' 7"  5\' 7"  5\' 6"   Weight 170 lbs 10 oz 170 lbs 170 lbs 10 oz  BMI 26.71 51.76 16.07  Systolic 371 062 694  Diastolic 80 84 69  Pulse 72 57 64     Physical Exam Constitutional:      Appearance: She is well-developed and normal weight.  Cardiovascular:     Rate and Rhythm: Normal rate and regular rhythm. Occasional extrasystoles are present.    Pulses: Intact distal pulses.     Heart sounds: Murmur heard.   Midsystolic murmur is present with a grade of 2/6 radiating to the apex. No gallop.      Comments: No leg edema, no JVD. Pulmonary:     Effort: Pulmonary effort is normal.     Breath sounds: Normal breath sounds.  Abdominal:     General: Bowel sounds are normal.     Palpations: Abdomen is soft.  Musculoskeletal:     Cervical back: Normal range of motion.  Neurological:     General: No focal deficit present.     Mental Status: She is alert and oriented to person, place, and time.  Psychiatric:        Mood and Affect: Mood normal.    Laboratory examination:    Recent Labs    01/15/20 0814 02/11/20 1018 08/31/20 0911  NA 138 134 139  K 4.5 5.0 4.5  CL 102 99 104  CO2 27 24 25   GLUCOSE 82 86 91  BUN 14 12 18   CREATININE 0.85 0.71 0.88  CALCIUM 9.7 9.6 10.0  GFRNONAA >60 97 >60  GFRAA >60 112  --    CrCl cannot be calculated (Patient's most recent lab result is older than the maximum 21 days allowed.).  CMP Latest Ref Rng & Units 08/31/2020 02/11/2020 01/15/2020  Glucose 70 - 99  mg/dL 91 86 82  BUN 6 - 20 mg/dL 18 12 14   Creatinine 0.44 - 1.00 mg/dL 0.88 0.71 0.85  Sodium 135 - 145 mmol/L 139 134 138  Potassium 3.5 - 5.1 mmol/L 4.5 5.0 4.5  Chloride 98 - 111 mmol/L 104 99 102  CO2 22 - 32 mmol/L 25 24 27   Calcium 8.9 - 10.3 mg/dL 10.0 9.6 9.7  Total Protein 6.5 - 8.1 g/dL 8.5(H) 7.5 7.9  Total Bilirubin 0.3 - 1.2 mg/dL 0.5 0.5 0.7  Alkaline Phos 38 - 126 U/L 73 65 53  AST 15 - 41 U/L 30 26 28   ALT 0 - 44 U/L 39 27 35   CBC Latest Ref Rng & Units 08/31/2020 01/15/2020 06/23/2019  WBC 4.0 - 10.5 K/uL 3.3(L) 5.2 5.6  Hemoglobin 12.0 - 15.0 g/dL 12.7 13.1 13.3  Hematocrit 36.0 - 46.0 % 38.2 38.2 39.3  Platelets 150 - 400 K/uL 262 226 259   Lipid Panel Recent Labs    12/26/19 0810 02/11/20 1018 04/05/20 0949  CHOL 272* 201* 205*  TRIG 76 194* 125  LDLCALC 186* 107* 119*  HDL 74 61 64  CHOLHDL 3.7  --  3.2    HEMOGLOBIN A1C Lab Results  Component Value Date   HGBA1C 5.5 01/27/2013   TSH Recent Labs    02/11/20 1018  TSH 0.885   Medications and allergies   Allergies  Allergen Reactions  . Penicillins Shortness Of Breath and Rash    Has patient had a PCN reaction causing immediate rash, facial/tongue/throat swelling, SOB or lightheadedness with hypotension: no Has patient had a PCN reaction causing severe rash involving mucus membranes or skin necrosis: no Has patient had a PCN reaction that required hospitalization no Has patient had a PCN reaction occurring within the last 10 years: no If all of the above answers  are "NO", then may proceed with Cephalosporin use.  Other reaction(s): Unknown  . Codeine Other (See Comments)    Manic depressive Other reaction(s): Unknown  . Voltaren [Diclofenac] Nausea And Vomiting and Other (See Comments)    Pt stated "tore stomach up"  . Crestor [Rosuvastatin] Other (See Comments)    Myalgia  . Diclofenac Sodium   . Vytorin [Ezetimibe-Simvastatin] Other (See Comments)    Severe myalgia  . Erythromycin Rash    Other reaction(s): Unknown    Current Outpatient Medications  Medication Instructions  . atorvastatin (LIPITOR) 10 mg, Oral, Daily  . carvedilol (COREG) 6.25 MG tablet TAKE 1 TABLET BY MOUTH TWICE A DAY WITH A MEAL  . FLUoxetine (PROZAC) 80 mg, Oral, Daily, 2 Tablets daily  . lisinopril (ZESTRIL) 10 mg, Oral, Daily  . LORazepam (ATIVAN) 0.5-1 mg, Oral, 3 times daily PRN, Prn anxiety  . MISC NATURAL PRODUCT OP 2 capsules, Oral, Daily, Omega-Q   . MISC NATURAL PRODUCTS PO 1 capsule, Oral, 2 times daily, Optimal- V   . MISC NATURAL PRODUCTS PO 1 capsule, Oral, 2 times daily, Optimal-M   . MISC NATURAL PRODUCTS PO 1 capsule, Oral, 2 times daily, MagnaCal-D   . tiZANidine (ZANAFLEX) 2 mg, Oral, Every 6 hours PRN   Radiology:  No results found. Cardiac Studies:   Lexiscan myoview stress test 04/07/2016: 1. Resting EKG demonstrates normal sinus rhythm with frequent PVCs in the pattern of ventricular bigeminy.  Low-voltage complexes.  PVCs decreased in the recovery phase of Lexiscan stress test. 2. Left ventricular cavity is noted to be mildly dilated with LV end diastolic volume of 193 mL on the  rest and stress studies. Marland Kitchen SPECT images demonstrate homogeneous tracer distribution throughout the myocardium.  The left ventricular ejection fraction was calculated or visually estimated to be 33% with global hypokinesis. Findings are consistent with nonischemic cardiomyopathy.  Holter monitor 06/06/2017: Sinus rhythm.  Minimum heart rate 54 bpm, average heart rate  71 bpm, maximum heart rate 102 bpm.  Total ventricular ectopy 3.3%.  Less than 1% supraventricular ectopy  No atrial fibrillation, atrial flutter, ventricular tachycardia.  Echocardiogram 10/04/2020: Left ventricle cavity is normal in size and ventricular wall thickness. Abnormal septal wall motion due to left bundle branch block and moderate global hypokinesis. Cannot exclude apical thrombus. LVEF 35-40%. Grade 1 diastolic function, normal left atrial pressure.  Calculated EF 58%. Left atrial cavity is moderately dilated. Mild (grade 1) mitral regurgitation.  No evidence of pulmonary hypertension. No significant change compared to previous study on 12/30/2019.   EKG:     EKG 11/01/2020: Normal sinus rhythm at rate of 61 bpm, left bundle branch block.  No further analysis.  No significant change from 12/23/2019.    Assessment     ICD-10-CM   1. Non-ischemic cardiomyopathy (HCC)  I42.8 EKG 12-Lead  2. LBBB (left bundle branch block)  I44.7   3. Essential hypertension, benign  I10   4. Hypercholesteremia  E78.00 atorvastatin (LIPITOR) 10 MG tablet    Lipid Panel With LDL/HDL Ratio   Recommendations:   Meds ordered this encounter  Medications  . atorvastatin (LIPITOR) 10 MG tablet    Sig: Take 1 tablet (10 mg total) by mouth daily.    Dispense:  30 tablet    Refill:  2    Discontinue Vytorin - myalgia    Lindsey Rodriguez  is a 55 y.o. Caucasian female, with frequent PVCs and  new onset left bundle branch block and non ischemic cardiomyopathy diagnosed in 2017 with severe LV systolic dysfunction, EF has improved with medical therapy to around 40 to 45%.  She has hypertension and hyperlipidemia.  She has also been diagnosed with IgG lambda MGUS, small fiber neuropathy, anxiety and depression, panic attacks and severe degenerative L5S1 spine disease and also fibromyalgia.   She underwent L4-S1 fusion in January 2022 and has recuperated well but still having radicular pain in her right leg  and has been scheduled for myelogram.  This is her 78-month office visit.  With regard to blood pressure, well controlled on lisinopril.  She has had difficulty in taking high dose of any other medications.  She will continue with carvedilol at 6.25 mg p.o. twice daily.  She could not tolerate higher dose due to dizziness and low blood pressure.  From cardiac standpoint, LVEF has remained stable, no clinical evidence of heart failure.  With regard to hyperlipidemia, she has not been able to tolerate Crestor in the past and now she is having severe myalgias with Vytorin.  I will discontinue this and we will try Lipitor 10 mg and if she tolerates this we could add Zetia to the present medical regimen.  She will obtain lipids sometime in middle of July and I will follow-up on this on MyChart.  Otherwise I will see her back in 6 months.   Adrian Prows, MD, [facility] 11/01/2020, 10:43 AM Office: [phone]

## 2020-11-01 NOTE — Progress Notes (Signed)
Phone call to patient to verify medication list and allergies for myelogram procedure. Pt instructed to hold Prozac for 48hrs prior to myelogram appointment time and 24 hours after appointment. Pt also instructed to have a driver the day of the procedure, the procedure would take around 2 hours, and discharge instructions discussed. Pt verbalized understanding.   

## 2020-11-03 DIAGNOSIS — M545 Low back pain, unspecified: Secondary | ICD-10-CM | POA: Diagnosis not present

## 2020-11-03 DIAGNOSIS — Z981 Arthrodesis status: Secondary | ICD-10-CM | POA: Diagnosis not present

## 2020-11-08 DIAGNOSIS — Z981 Arthrodesis status: Secondary | ICD-10-CM | POA: Diagnosis not present

## 2020-11-08 DIAGNOSIS — M545 Low back pain, unspecified: Secondary | ICD-10-CM | POA: Diagnosis not present

## 2020-11-10 ENCOUNTER — Inpatient Hospital Stay: Admission: RE | Admit: 2020-11-10 | Payer: Medicare PPO | Source: Ambulatory Visit

## 2020-11-10 ENCOUNTER — Other Ambulatory Visit: Payer: Medicare PPO

## 2020-11-15 DIAGNOSIS — Z981 Arthrodesis status: Secondary | ICD-10-CM | POA: Diagnosis not present

## 2020-11-15 DIAGNOSIS — M545 Low back pain, unspecified: Secondary | ICD-10-CM | POA: Diagnosis not present

## 2020-11-17 DIAGNOSIS — G8929 Other chronic pain: Secondary | ICD-10-CM | POA: Diagnosis not present

## 2020-11-17 DIAGNOSIS — M549 Dorsalgia, unspecified: Secondary | ICD-10-CM | POA: Diagnosis not present

## 2020-11-17 DIAGNOSIS — M533 Sacrococcygeal disorders, not elsewhere classified: Secondary | ICD-10-CM | POA: Diagnosis not present

## 2020-11-22 DIAGNOSIS — Z981 Arthrodesis status: Secondary | ICD-10-CM | POA: Diagnosis not present

## 2020-11-22 DIAGNOSIS — M545 Low back pain, unspecified: Secondary | ICD-10-CM | POA: Diagnosis not present

## 2020-11-22 NOTE — Telephone Encounter (Signed)
Can you do this for me Ms Mares? Nothing has changed.

## 2020-11-22 NOTE — Telephone Encounter (Signed)
From pt

## 2020-11-25 DIAGNOSIS — M545 Low back pain, unspecified: Secondary | ICD-10-CM | POA: Diagnosis not present

## 2020-11-25 DIAGNOSIS — Z981 Arthrodesis status: Secondary | ICD-10-CM | POA: Diagnosis not present

## 2020-11-26 NOTE — Telephone Encounter (Signed)
From patient.

## 2020-12-01 ENCOUNTER — Ambulatory Visit
Admission: RE | Admit: 2020-12-01 | Discharge: 2020-12-01 | Disposition: A | Discharge status: Home / Self Care | Payer: Medicare PPO | Source: Ambulatory Visit | Attending: Physician Assistant | Admitting: Physician Assistant

## 2020-12-01 DIAGNOSIS — M5126 Other intervertebral disc displacement, lumbar region: Secondary | ICD-10-CM | POA: Diagnosis not present

## 2020-12-01 DIAGNOSIS — M549 Dorsalgia, unspecified: Secondary | ICD-10-CM

## 2020-12-01 DIAGNOSIS — M79604 Pain in right leg: Secondary | ICD-10-CM | POA: Diagnosis not present

## 2020-12-01 DIAGNOSIS — M4326 Fusion of spine, lumbar region: Secondary | ICD-10-CM | POA: Diagnosis not present

## 2020-12-01 DIAGNOSIS — M5124 Other intervertebral disc displacement, thoracic region: Secondary | ICD-10-CM | POA: Diagnosis not present

## 2020-12-01 MED ORDER — IOPAMIDOL (ISOVUE-M 300) INJECTION 61%
10.0000 mL | Freq: Once | INTRAMUSCULAR | Status: AC | PRN
  Administered 2020-12-01: 10 mL via INTRATHECAL

## 2020-12-01 MED ORDER — ONDANSETRON HCL 4 MG/2ML IJ SOLN
4.0000 mg | Freq: Once | INTRAMUSCULAR | Status: AC
  Administered 2020-12-01: 4 mg via INTRAMUSCULAR

## 2020-12-01 MED ORDER — DIAZEPAM 5 MG PO TABS
10.0000 mg | ORAL_TABLET | Freq: Once | ORAL | Status: AC
  Administered 2020-12-01: 10 mg via ORAL

## 2020-12-01 MED ORDER — MEPERIDINE HCL 50 MG/ML IJ SOLN
50.0000 mg | Freq: Once | INTRAMUSCULAR | Status: AC
  Administered 2020-12-01: 50 mg via INTRAMUSCULAR

## 2020-12-01 NOTE — Discharge Instr - Other Orders (Addendum)
1020: pt reports pain in back from myelogram procedure. Pt reports pain 9/10, achy type pain. See MAR.  1050: pt reports relief in pain. Pt reports pain is 2/10 now and is "much better"

## 2020-12-01 NOTE — Progress Notes (Signed)
Pt reports she has been off of her Prozac for at least 48 hours.

## 2020-12-01 NOTE — Discharge Instructions (Signed)
Myelogram Discharge Instructions  1. Go home and rest quietly as needed. You may resume normal activities; however, do not exert yourself strongly or do any heavy lifting today and tomorrow.   2. DO NOT drive today.    3. You may resume your normal diet and medications unless otherwise indicated. Drink lots of extra fluids today and tomorrow.   4. The incidence of headache, nausea, or vomiting is about 5% (one in 20 patients).  If you develop a headache, lie flat for 24 hours and drink plenty of fluids until the headache goes away.  Caffeinated beverages may be helpful. If when you get up you still have a headache when standing, go back to bed and force fluids for another 24 hours.   5. If you develop severe nausea and vomiting or a headache that does not go away with the flat bedrest after 48 hours, please call [phone].   6. Call your physician for a follow-up appointment.  The results of your myelogram will be sent directly to your physician by the following day.  7. If you have any questions or if complications develop after you arrive home, please call [phone].  Discharge instructions have been explained to the patient.  The patient, or the person responsible for the patient, fully understands these instructions.   Thank you for visiting our office today.   YOU MAY RESUME YOUR PROZAC TOMORROW 12/02/20 AT 9:30 AM OR AFTER

## 2020-12-15 DIAGNOSIS — M545 Low back pain, unspecified: Secondary | ICD-10-CM | POA: Diagnosis not present

## 2020-12-15 DIAGNOSIS — Z981 Arthrodesis status: Secondary | ICD-10-CM | POA: Diagnosis not present

## 2020-12-20 DIAGNOSIS — Z981 Arthrodesis status: Secondary | ICD-10-CM | POA: Diagnosis not present

## 2020-12-20 DIAGNOSIS — M545 Low back pain, unspecified: Secondary | ICD-10-CM | POA: Diagnosis not present

## 2020-12-23 ENCOUNTER — Other Ambulatory Visit: Payer: Self-pay | Admitting: Cardiology

## 2020-12-23 DIAGNOSIS — Z981 Arthrodesis status: Secondary | ICD-10-CM | POA: Diagnosis not present

## 2020-12-23 DIAGNOSIS — M545 Low back pain, unspecified: Secondary | ICD-10-CM | POA: Diagnosis not present

## 2020-12-23 DIAGNOSIS — I10 Essential (primary) hypertension: Secondary | ICD-10-CM

## 2020-12-27 DIAGNOSIS — M545 Low back pain, unspecified: Secondary | ICD-10-CM | POA: Diagnosis not present

## 2020-12-27 DIAGNOSIS — Z981 Arthrodesis status: Secondary | ICD-10-CM | POA: Diagnosis not present

## 2021-02-03 ENCOUNTER — Encounter: Payer: Self-pay | Admitting: Family Medicine

## 2021-02-03 ENCOUNTER — Ambulatory Visit: Payer: Medicare PPO | Admitting: Family Medicine

## 2021-02-03 ENCOUNTER — Other Ambulatory Visit: Payer: Self-pay

## 2021-02-03 VITALS — BP 120/74 | HR 64 | Temp 98.2°F | Ht 67.0 in | Wt 174.2 lb

## 2021-02-03 DIAGNOSIS — T63421A Toxic effect of venom of ants, accidental (unintentional), initial encounter: Secondary | ICD-10-CM | POA: Diagnosis not present

## 2021-02-03 MED ORDER — EPINEPHRINE 0.3 MG/0.3ML IJ SOAJ: 0.3000 mg | INTRAMUSCULAR | 0 refills | Status: AC | PRN

## 2021-02-03 NOTE — Progress Notes (Signed)
Chief Complaint  Patient presents with   Acute Visit    Ant bites yesterday afternoon. Has taken benadryl and using hydrcortisone cream. Using ice, last night ankle was twice the size.     Yesterday at 1:50pm, while in [place] (at a [employer]), she was bitten by Physiological scientist on RLE. Leg swelled immediately, was itching.  She reports that her hands started itching, then she started with some blisters on the inside of both arms, rash on her torso under her arms, "welts everywhere" by the time she got home. She felt slightly short of breath, talked to her husband on the phone the whole way home, and took benadryl when she got home (didn't want to go to the ER). She states she drank "half a bottle", "over an ounce" of liquid benadryl, as well as used ice compresses on her legs and arms.  She also took a detox bath with Epsom salts, ginger and baking soda when she got home, which helped a lot. She also took 2 benadryl tablets last night, and 1 this morning. Last dose at 7am.  Last night her ankle was much more swollen than it is now.  Iced it last night. She notes that the redness is spreading further down the foot.  Not spreading up the leg.  No fever or chlls.  Hurts to walk.  The bites at the R medial ankle hurt.  The lesions on the posterior calf are itchy.  No further issues with upper body (blisters, rash, pain, no shortness of breath).  She has had fire ant bites before, and reports each time the severity gets worse.  Was very scared yesterday with upper body and abdomen itching, and some slight trouble breathing. Fire chief friend told her to get epi-pen   PMH, PSH, SH reviewed  Outpatient Encounter Medications as of 02/03/2021  Medication Sig Note   carvedilol (COREG) 6.25 MG tablet TAKE 1 TABLET BY MOUTH TWICE DAILY WITH A MEAL    diphenhydrAMINE (BENADRYL) 25 MG tablet Take 25 mg by mouth every 6 (six) hours as needed. 02/03/2021: Last dose 7am    diphenhydramine-calamine  (CALADRYL) 1-8 % LOTN Apply topically 2 (two) times daily as needed.    FLUoxetine (PROZAC) 40 MG capsule Take 40 mg by mouth daily.    hydrocortisone cream 1 % Apply 1 application topically as needed for itching.    LORazepam (ATIVAN) 0.5 MG tablet Take 1-2 tablets (0.5-1 mg total) by mouth 3 (three) times daily as needed for anxiety. Prn anxiety 02/03/2021: As needed    MISC NATURAL PRODUCT OP Take 2 capsules by mouth daily. Omega-Q    MISC NATURAL PRODUCTS PO Take 1 capsule by mouth 2 (two) times daily. Optimal- V    MISC NATURAL PRODUCTS PO Take 1 capsule by mouth 2 (two) times daily. Optimal-M    MISC NATURAL PRODUCTS PO Take 1 capsule by mouth 2 (two) times daily. MagnaCal-D 06/20/2017: 2 capsules twice daily   lisinopril (ZESTRIL) 10 MG tablet Take 1 tablet (10 mg total) by mouth daily. (Patient taking differently: Take 5 mg by mouth daily.) 02/03/2021: '10mg'$  daily    tiZANidine (ZANAFLEX) 2 MG tablet Take 1 tablet (2 mg total) by mouth every 6 (six) hours as needed for muscle spasms. (Patient not taking: Reported on 02/03/2021)    [DISCONTINUED] atorvastatin (LIPITOR) 10 MG tablet Take 1 tablet (10 mg total) by mouth daily.    [DISCONTINUED] lisinopril (ZESTRIL) 5 MG tablet Take by mouth.    No  facility-administered encounter medications on file as of 02/03/2021.    Allergies  Allergen Reactions   Penicillins Shortness Of Breath and Rash    Has patient had a PCN reaction causing immediate rash, facial/tongue/throat swelling, SOB or lightheadedness with hypotension: no Has patient had a PCN reaction causing severe rash involving mucus membranes or skin necrosis: no Has patient had a PCN reaction that required hospitalization no Has patient had a PCN reaction occurring within the last 10 years: no If all of the above answers are "NO", then may proceed with Cephalosporin use.  Other reaction(s): Unknown   Codeine Other (See Comments)    Manic depressive Other reaction(s): Unknown    Voltaren [Diclofenac] Nausea And Vomiting and Other (See Comments)    Pt stated "tore stomach up"   Crestor [Rosuvastatin] Other (See Comments)    Myalgia   Diclofenac Sodium    Vytorin [Ezetimibe-Simvastatin] Other (See Comments)    Severe myalgia   Erythromycin Rash    Other reaction(s): Unknown   ROS: Denies fever, chills (just yesterday during episode),  URI symptoms, headaches, shortness of breath (just yesterday), chest pain.  Denies nausea, vomiting, bowel changes, urinary complaints, bleeding, bruising.Wilburn Mylar and this morning felt a little dizzy when she stood up, not since 11 am.   Had slurred speech and dry mouth related to the high doses of benadryl.  See HPI  PHYSICAL EXAM:  BP 120/74   Pulse 64   Temp 98.2 F (36.8 C) (Tympanic)   Ht '5\' 7"'$  (1.702 m)   Wt 174 lb 3.2 oz (79 kg)   LMP 12/16/2012   BMI 27.28 kg/m   Well-appearing female, who appeared to be in moderate discomfort when trying to walk, otherwise appeared comfortable. HEENT: conjunctiva and sclera are clear, EOMI. OP is clear Neck: no lymphadenopathy or mass Heart: regular rate and rhythm Lungs: clear bilaterally Skin: Right medial ankle--cluster of 10 papules/vesicles/pustules. Intact. No surrounding redness, just a very mild diffuse erythema of the whole area, down to the 1st MTP, and up to mid-lower portion of calf anteriorly.  Right posterior calf has 4 lesions (and one at achilles), and these are surrounded with a 4-5 cm round area of erythema. She states these are itchy  Normal pulses, brisk capillary refill.   ASSESSMENT/PLAN:  Fire ant bite, accidental or unintentional, initial encounter - local reaction per current exam. ?upper body involvement yesterday. To keep benadryl/antihistamine in purse  Unsure etiology of upper body symptoms and slight shortness of breath yesterday, unclear if panic/anxiety contributing.  She took far too much benadryl.  Exam today is reportedly much better.   Morning dose of benadryl is out of her system, and she has some itching now, all limited to RLE. Given rx for epipen per her request, but counseled in detail of when to use it, and the need to go to ER if used. Encouraged her to keep antihistamines to use in her purse.  Discuss risks of taking too much benadryl, follow dosing guidelines. Discussed preventative measures (closed toe shoes when in grass)   Reviewed s/sx of cellulitis and worsening reaction for which she should seek additional care. At this point, do not see need for any oral or topical steroids. Encouraged daily antihistamine with benadryl prn, and can consider also using Pepcid BID. Ice/elevation, tylenol prn pain. All questions answered.  I spent 37 minutes dedicated to the care of this patient, including pre-visit review of records, face to face time, post-visit ordering of testing and documentation.

## 2021-02-03 NOTE — Patient Instructions (Signed)
Continue to ice the ankle (for swelling/pain). Keep it elevated when swollen/painful. Take claritin or zyrtec or allegra once daily.  Continue to use benadryl in addition if needed for swelling and itching.  You can also continue topical medications for the itching--hydrocortisone cream or calamine).   This appears to be a localized reaction.  Yesterday's episode involving the upper extremities--?? Completely resolved today, so I don't feel like we need to use oral/injectable steroids at this time.  We are giving you a prescription for an epi-pen to have on hand in case of anaphylactic reaction in the future.  Use this if you have tongue/throat swelling or you're unable to breathe.  Anytime you use this, you need to go to the emergency room for continued evaluation.  Please keep benadryl and/or zyrtec/claritin/allegra with you at all times. Use these medications first (benadryl works the fasted), and continue to monitor for worsening, using the epi-pen only if you develop the above-mentioned symptoms. Use caution if driving after taking benadryl, avoid driving if taking epi-pen (call 911 or have someone else take you to the ER).  Signs of infection--increasing redness, swelling, pain, fever, streaks up the leg.  This requires re-evaluation if this occurs.

## 2021-02-07 DIAGNOSIS — M5134 Other intervertebral disc degeneration, thoracic region: Secondary | ICD-10-CM | POA: Diagnosis not present

## 2021-02-07 DIAGNOSIS — M160 Bilateral primary osteoarthritis of hip: Secondary | ICD-10-CM | POA: Diagnosis not present

## 2021-02-07 DIAGNOSIS — M549 Dorsalgia, unspecified: Secondary | ICD-10-CM | POA: Diagnosis not present

## 2021-02-07 DIAGNOSIS — Z981 Arthrodesis status: Secondary | ICD-10-CM | POA: Diagnosis not present

## 2021-02-07 DIAGNOSIS — M5136 Other intervertebral disc degeneration, lumbar region: Secondary | ICD-10-CM | POA: Diagnosis not present

## 2021-02-21 ENCOUNTER — Other Ambulatory Visit: Payer: Self-pay | Admitting: Cardiology

## 2021-02-21 DIAGNOSIS — I10 Essential (primary) hypertension: Secondary | ICD-10-CM

## 2021-03-02 DIAGNOSIS — T63421A Toxic effect of venom of ants, accidental (unintentional), initial encounter: Secondary | ICD-10-CM | POA: Diagnosis not present

## 2021-03-08 ENCOUNTER — Inpatient Hospital Stay: Payer: Medicare PPO | Attending: Hematology

## 2021-03-08 ENCOUNTER — Other Ambulatory Visit: Payer: Self-pay

## 2021-03-08 DIAGNOSIS — D472 Monoclonal gammopathy: Secondary | ICD-10-CM | POA: Diagnosis not present

## 2021-03-08 LAB — CMP (CANCER CENTER ONLY)
ALT: 39 U/L (ref 0–44)
AST: 26 U/L (ref 15–41)
Albumin: 4.7 g/dL (ref 3.5–5.0)
Alkaline Phosphatase: 70 U/L (ref 38–126)
Anion gap: 10 (ref 5–15)
BUN: 13 mg/dL (ref 6–20)
CO2: 26 mmol/L (ref 22–32)
Calcium: 10.2 mg/dL (ref 8.9–10.3)
Chloride: 103 mmol/L (ref 98–111)
Creatinine: 0.83 mg/dL (ref 0.44–1.00)
GFR, Estimated: >60 mL/min (ref >60)
Glucose, Bld: 93 mg/dL (ref 70–99)
Potassium: 4.9 mmol/L (ref 3.5–5.1)
Sodium: 139 mmol/L (ref 135–145)
Total Bilirubin: 0.6 mg/dL (ref 0.3–1.2)
Total Protein: 8.3 g/dL — ABNORMAL HIGH (ref 6.5–8.1)

## 2021-03-08 LAB — CBC WITH DIFFERENTIAL/PLATELET
Abs Immature Granulocytes: 0.02 10*3/uL (ref 0.00–0.07)
Basophils Absolute: 0.1 10*3/uL (ref 0.0–0.1)
Basophils Relative: 1 %
Eosinophils Absolute: 0.1 10*3/uL (ref 0.0–0.5)
Eosinophils Relative: 2 %
HCT: 38.8 % (ref 36.0–46.0)
Hemoglobin: 13 g/dL (ref 12.0–15.0)
Immature Granulocytes: 0 %
Lymphocytes Relative: 51 %
Lymphs Abs: 2.4 10*3/uL (ref 0.7–4.0)
MCH: 33.2 pg (ref 26.0–34.0)
MCHC: 33.5 g/dL (ref 30.0–36.0)
MCV: 99 fL (ref 80.0–100.0)
Monocytes Absolute: 0.4 10*3/uL (ref 0.1–1.0)
Monocytes Relative: 10 %
Neutro Abs: 1.7 10*3/uL (ref 1.7–7.7)
Neutrophils Relative %: 36 %
Platelets: 284 10*3/uL (ref 150–400)
RBC: 3.92 MIL/uL (ref 3.87–5.11)
RDW: 12.8 % (ref 11.5–15.5)
WBC: 4.6 10*3/uL (ref 4.0–10.5)
nRBC: 0 % (ref 0.0–0.2)

## 2021-03-09 DIAGNOSIS — H35033 Hypertensive retinopathy, bilateral: Secondary | ICD-10-CM | POA: Diagnosis not present

## 2021-03-11 LAB — MULTIPLE MYELOMA PANEL, SERUM
Albumin SerPl Elph-Mcnc: 4.1 g/dL (ref 2.9–4.4)
Albumin/Glob SerPl: 1.2 (ref 0.7–1.7)
Alpha 1: 0.2 g/dL (ref 0.0–0.4)
Alpha2 Glob SerPl Elph-Mcnc: 0.7 g/dL (ref 0.4–1.0)
B-Globulin SerPl Elph-Mcnc: 1.2 g/dL (ref 0.7–1.3)
Gamma Glob SerPl Elph-Mcnc: 1.5 g/dL (ref 0.4–1.8)
Globulin, Total: 3.6 g/dL (ref 2.2–3.9)
IgA: 63 mg/dL — ABNORMAL LOW (ref 87–352)
IgG (Immunoglobin G), Serum: 1684 mg/dL — ABNORMAL HIGH (ref 586–1602)
IgM (Immunoglobulin M), Srm: 54 mg/dL (ref 26–217)
M Protein SerPl Elph-Mcnc: 1.1 g/dL — ABNORMAL HIGH
Total Protein ELP: 7.7 g/dL (ref 6.0–8.5)

## 2021-03-15 ENCOUNTER — Inpatient Hospital Stay: Payer: Medicare PPO | Admitting: Hematology

## 2021-03-15 DIAGNOSIS — N200 Calculus of kidney: Secondary | ICD-10-CM | POA: Diagnosis not present

## 2021-03-15 DIAGNOSIS — N23 Unspecified renal colic: Secondary | ICD-10-CM | POA: Diagnosis not present

## 2021-03-16 DIAGNOSIS — M4326 Fusion of spine, lumbar region: Secondary | ICD-10-CM | POA: Diagnosis not present

## 2021-03-16 DIAGNOSIS — N200 Calculus of kidney: Secondary | ICD-10-CM | POA: Diagnosis not present

## 2021-03-16 DIAGNOSIS — K8020 Calculus of gallbladder without cholecystitis without obstruction: Secondary | ICD-10-CM | POA: Diagnosis not present

## 2021-03-16 DIAGNOSIS — Z87891 Personal history of nicotine dependence: Secondary | ICD-10-CM | POA: Diagnosis not present

## 2021-03-16 DIAGNOSIS — R109 Unspecified abdominal pain: Secondary | ICD-10-CM | POA: Diagnosis not present

## 2021-03-16 DIAGNOSIS — K5730 Diverticulosis of large intestine without perforation or abscess without bleeding: Secondary | ICD-10-CM | POA: Diagnosis not present

## 2021-03-16 DIAGNOSIS — K760 Fatty (change of) liver, not elsewhere classified: Secondary | ICD-10-CM | POA: Diagnosis not present

## 2021-03-16 DIAGNOSIS — I700 Atherosclerosis of aorta: Secondary | ICD-10-CM | POA: Diagnosis not present

## 2021-03-17 ENCOUNTER — Encounter: Payer: Self-pay | Admitting: Family Medicine

## 2021-03-17 ENCOUNTER — Telehealth: Payer: Self-pay | Admitting: *Deleted

## 2021-03-17 NOTE — Telephone Encounter (Signed)
Patient called and she is in a lot of pain, the only time she has not been in pain for over a week was last night when she was given Dilaudid. She was referred to a surgeon in [place] that cannot do gallbladder surgery until end of next week. She called [facility] and they can't do until Tues 9/20. She said she cannot continue to take apin mediation for that long, wanting to have surgery done ASAP-wants to know if we can call and see if we can get her in sooner, states no referral needed for gallbladder surgery. Please advise.

## 2021-03-17 NOTE — Telephone Encounter (Signed)
You can call and see.  Usually if the surgery is needed ASAP (due to obstruction, or other indications to have it done right away), they are admitted to the hospital.  So, I'm guessing there was no obstruction or "stat" need, and those surgeries will take priority over the outpatient referrals.  9/20 is <1 week to take pain meds. Feel free to call and see what they tell you.  Thanks

## 2021-03-17 NOTE — Telephone Encounter (Signed)
Patient advised and verbalized understanding. I let her know that I did call over and try to get her in sooner but was advised that gallbladders are scheduled as non urgent and Tuesday was the soonest appt. I did advise her to go back to ER if needed.

## 2021-03-22 ENCOUNTER — Inpatient Hospital Stay: Payer: Medicare PPO | Admitting: Hematology

## 2021-03-30 ENCOUNTER — Encounter: Payer: Self-pay | Admitting: *Deleted

## 2021-03-31 DIAGNOSIS — R300 Dysuria: Secondary | ICD-10-CM | POA: Diagnosis not present

## 2021-04-06 ENCOUNTER — Ambulatory Visit: Payer: Medicare PPO | Admitting: Family Medicine

## 2021-04-12 ENCOUNTER — Other Ambulatory Visit: Payer: Self-pay

## 2021-04-12 ENCOUNTER — Inpatient Hospital Stay: Payer: Medicare PPO | Attending: Hematology | Admitting: Hematology

## 2021-04-12 VITALS — BP 114/67 | HR 70 | Temp 97.8°F | Resp 17 | Ht 67.0 in | Wt 177.9 lb

## 2021-04-12 DIAGNOSIS — D472 Monoclonal gammopathy: Secondary | ICD-10-CM | POA: Diagnosis not present

## 2021-04-12 NOTE — Progress Notes (Signed)
  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 Skeletal survey negative. Bone marrow deferred as per patient choice.  Current Treatment: observation  INTERVAL HISTORY: Lindsey Rodriguez is here for her scheduled 6 month follow-up for IgG lambda MGUS. The patient's last visit with us was on 08/2020. The pt reports that she is doing well overall.   She learnt that she is allergic to fire ant bites and was recommended to carry an EpiPen. She has had 2 incidents. The first one was in the cemetery when she was going to a funeral and the second incident was at a roadside photo-op. She is not allergic to bee stings.  She had what doctors thought was a gallbladder attack and she was referred to a surgeon.  No gallstones were found. However, she then visited a chiropractor for pain in her right lower back and after adjustment to her neck, the pain was resolved. She reports she is doing better.  She has gone back to a stricter diet and is exercising more but she is not losing weight and she is worried. She will like to have her hormone levels tested.  Labs done on 03/08/2021 were reviewed with the patient.  CBC within normal limits, CMP within normal limits.  Myeloma panel shows stable M spike at 1.1 g/dL down from 1.3 g/dL.  She confirms that she is okay from a cardiology standpoint.  She denies abdominal pain and LE edema  REVIEW OF SYSTEMS:   .10 Point review of Systems was done is negative except as noted above.   Past Medical History:  Diagnosis Date   Anemia    Anxiety panic attacks   Dr. Kaur   Arthritis    knees   Cardiomyopathy, nonischemic (HCC) 04/07/2016    EF left ventricule calculated on Lexiscan stress test at 33% with global hypokinesis.    Chronic back pain    Chronic neck pain    Depression    Dysrhythmia    pvc's with cardiomyopathy   Family history of adverse reaction to anesthesia    mother has problems waking up   Fibromyalgia    and neuropathy of feet   History of kidney stones    lithotripsy  03/2016   Hyperlipidemia    Hypertension    Kidney stone    History   Kidney stones h/o   Melanoma (HCC) RUE   Dr. Gould   MGUS (monoclonal gammopathy of unknown significance) 06/2015   no treatment just watching recheck blood work 12/2015   Migraine    OTC med prn - stress related   Plantar fasciitis right   Renal stones 2008/2011/2017   Seasonal allergies    SVD (spontaneous vaginal delivery)    x 3   Tendonitis of foot right    Past Surgical History:  Procedure Laterality Date   ABLATION ON ENDOMETRIOSIS N/A 11/24/2015   Procedure: ABLATION ON ENDOMETRIOSIS;  Surgeon: Keon Horvath, MD;  Location: [facility];  Service: Gynecology;  Laterality: N/A;   ANTERIOR LAT LUMBAR FUSION Right 06/15/2016   Procedure: RIGHT LUMBAR THREE-FOUR ANTEROLATERAL LUMBAR INTERBODY FUSION WITH LATERAL PLATE;  Surgeon: Joseph Stern, MD;  Location: [facility];  Service: Neurosurgery;  Laterality: Right;     BACK SURGERY  Dr.Stern, last sx Summer 2011   X 4  (L3-S1)   BACK SURGERY  06/2016   BLADDER SUSPENSION N/A 11/24/2015   Procedure: TRANSVAGINAL TAPE (TVT) PROCEDURE;  Surgeon: [name], MD;  Location: [facility];  Service: Gynecology;  Laterality: N/A;   C2-3 nerve ablation Right 06/2019   Dr. Maia Petties (per pt)   COLONOSCOPY     CYSTOCELE REPAIR  11/24/2015   Procedure: ANTERIOR REPAIR (CYSTOCELE);  Surgeon: [name], MD;  Location: [facility];  Service: Gynecology;;   CYSTOSCOPY N/A 11/24/2015   Procedure: Consuela Mimes;  Surgeon: [name], MD;  Location: [facility];  Service: Gynecology;  Laterality: N/A;   DILATION AND CURETTAGE OF  UTERUS     ENDOMETRIAL ABLATION  08/2000   EYE SURGERY Bilateral    Lasik    KIDNEY STONE SURGERY  2011   LITHOTRIPSY  08/2017 (L), 03/2018 (R)   MELANOMA EXCISION  05/2015   right forearm; [name]   NECK SURGERY  [name]  Summer 2011   X 4  (C4-T1 Level)   RHINOPLASTY     ROBOTIC ASSISTED BILATERAL SALPINGO OOPHERECTOMY Bilateral 11/24/2015   Procedure: ROBOTIC ASSISTED BILATERAL SALPINGO OOPHORECTOMY;  Surgeon: [name], MD;  Location: [facility];  Service: Gynecology;  Laterality: Bilateral;--OVARIES NOT REMOVED PER PATH REPORT   ROBOTIC ASSISTED TOTAL HYSTERECTOMY WITH SALPINGECTOMY Bilateral 11/24/2015   Procedure: ROBOTIC ASSISTED TOTAL HYSTERECTOMY WITH SALPINGECTOMY;  Surgeon: [name], MD;  Location: [facility];  Service: Gynecology;  Laterality: Bilateral;   TONSILLECTOMY     TOTAL VAGINAL HYSTERECTOMY     WISDOM TOOTH EXTRACTION      Social History   Tobacco Use   Smoking status: Former    Packs/day: 0.10    Years: 3.00    Pack years: 0.30    Types: Cigarettes    Quit date: 07/04/1987    Years since quitting: 33.7   Smokeless tobacco: Never  Vaping Use   Vaping Use: Never used  Substance Use Topics   Alcohol use: Yes    Alcohol/week: 0.0 standard drinks    Comment: rarely    Drug use: No   ALLERGIES:  is allergic to penicillins, codeine, voltaren [diclofenac], crestor [rosuvastatin], diclofenac sodium, vytorin [ezetimibe-simvastatin], and erythromycin.  MEDICATIONS:  Current Outpatient Medications  Medication Sig Dispense Refill   carvedilol (COREG) 6.25 MG tablet TAKE 1 TABLET BY MOUTH TWICE DAILY WITH A MEAL 120 tablet 0   diphenhydrAMINE (BENADRYL) 25 MG tablet Take 25 mg by mouth every 6 (six) hours as needed.     diphenhydramine-calamine (CALADRYL) 1-8 % LOTN Apply topically 2 (two) times daily as needed.     EPINEPHrine 0.3 mg/0.3 mL IJ SOAJ injection Inject 0.3 mg into the muscle as needed for anaphylaxis. 1 each 0   FLUoxetine (PROZAC) 40 MG  capsule Take 40 mg by mouth daily.     hydrocortisone cream 1 % Apply 1 application topically as needed for itching.     lisinopril (ZESTRIL) 10 MG tablet Take 1 tablet (10 mg total) by mouth daily. (Patient taking differently: Take 5 mg by mouth daily.) 90 tablet 3   LORazepam (ATIVAN) 0.5 MG tablet Take 1-2 tablets (0.5-1 mg total) by mouth 3 (three) times daily as needed for anxiety. Prn anxiety 30 tablet 0   MISC NATURAL PRODUCT OP Take 2 capsules by mouth daily. Omega-Q     MISC NATURAL PRODUCTS PO Take 1 capsule by mouth 2 (two) times daily. Optimal- V  MISC NATURAL PRODUCTS PO Take 1 capsule by mouth 2 (two) times daily. Optimal-M     MISC NATURAL PRODUCTS PO Take 1 capsule by mouth 2 (two) times daily. MagnaCal-D     tiZANidine (ZANAFLEX) 2 MG tablet Take 1 tablet (2 mg total) by mouth every 6 (six) hours as needed for muscle spasms. (Patient not taking: Reported on 02/03/2021) 30 tablet 5   No current facility-administered medications for this visit.    PHYSICAL EXAMINATION: ECOG PERFORMANCE STATUS: 2  Vitals:   04/12/21 0944  BP: 114/67  Pulse: 70  Resp: 17  Temp: 97.8 F (36.6 C)  SpO2: 98%    Filed Weights   04/12/21 0944  Weight: 177 lb 14.4 oz (80.7 kg)   .Body mass index is 27.86 kg/m.  . GENERAL:alert, in no acute distress and comfortable SKIN: no acute rashes, no significant lesions EYES: conjunctiva are pink and non-injected, sclera anicteric OROPHARYNX: MMM, no exudates, no oropharyngeal erythema or ulceration NECK: supple, no JVD LYMPH:  no palpable lymphadenopathy in the cervical, axillary or inguinal regions LUNGS: clear to auscultation b/l with normal respiratory effort HEART: regular rate & rhythm ABDOMEN:  normoactive bowel sounds , non tender, not distended. Extremity: no pedal edema PSYCH: alert & oriented x 3 with fluent speech NEURO: no focal motor/sensory deficits   LABORATORY DATA:   I have reviewed the data as listed  . CBC Latest  Ref Rng & Units 03/08/2021 08/31/2020 01/15/2020  WBC 4.0 - 10.5 K/uL 4.6 3.3(L) 5.2  Hemoglobin 12.0 - 15.0 g/dL 13.0 12.7 13.1  Hematocrit 36.0 - 46.0 % 38.8 38.2 38.2  Platelets 150 - 400 K/uL 284 262 226    . CMP Latest Ref Rng & Units 03/08/2021 08/31/2020 02/11/2020  Glucose 70 - 99 mg/dL 93 91 86  BUN 6 - 20 mg/dL 13 18 12  Creatinine 0.44 - 1.00 mg/dL 0.83 0.88 0.71  Sodium 135 - 145 mmol/L 139 139 134  Potassium 3.5 - 5.1 mmol/L 4.9 4.5 5.0  Chloride 98 - 111 mmol/L 103 104 99  CO2 22 - 32 mmol/L 26 25 24  Calcium 8.9 - 10.3 mg/dL 10.2 10.0 9.6  Total Protein 6.5 - 8.1 g/dL 8.3(H) 8.5(H) 7.5  Total Bilirubin 0.3 - 1.2 mg/dL 0.6 0.5 0.5  Alkaline Phos 38 - 126 U/L 70 73 65  AST 15 - 41 U/L 26 30 26  ALT 0 - 44 U/L 39 39 27        RADIOGRAPHIC STUDIES: I have personally reviewed the radiological images as listed and agreed with the findings in the report. No results found.  ASSESSMENT & PLAN:   54 y.o. 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  Previous UPEP and skeletal survey negative. Patient has no evidence of anemia, renal failure on labs. No hypercalcemia. No new bone pain. SPEP shows stable M-spike at 1g/dl with no significant increase over the last couple of follow-ups. SFLC ratio within normal limits -12/21/18 MRI Cervical spine which did not reveal any overt indication/concern for bone tumors. Continue follow up with Orthopedics and neurology.  PLAN: -Labs done on 03/08/2021 were reviewed with the patient.  CBC within normal limits, CMP within normal limits.   -Myeloma panel shows stable M sike at 1.1 g/dL down from 1.3 g/dL. -Pt still does not currently fit the CRAB criteria: no hypercalcemia, no renal insufficiency, no anemia.    No overt evidence of progression to multiple myeloma. -Will continue to monitor patient  clinically with labs every 6 months at this time, and no overt indication to pursue a BM Bx. -We have recommended she follow-up with her primary care physician Dr. Knapp in 6 months with repeat labs-CBC, CMP and myeloma panel. -We will see her back in 12 months with labs.    2) Systolic nonischemic CHF patient previously noted improvement in her EF from 33% to 45%. She subsequently noted her EF normalized to 55% as of 06/19/18 clinic visit.  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 Plan -Continue management per dermatology and ongoing skin surveillance. -continue sun protection.  4) Peripheral radiculopathy /neuropathy -Is currently followed by Stern of Neurosurgery as well as Dr Jaffe of Neurology who have begun to work this up further. -Most recent electromyography showed evidence of radiculopathy, mild in degree, and without evidence of sensorimotor polyneuropathy.   Cannot r/o small fiber neuropathy -  Evidence of this on epidermal nerve fiber density analysis. Plan -Mx per neurology  Continue followup with primary care physician, rheumatology, cardiology, and neurology.      FOLLOW UP: RTC with Dr. Eve Knapp her primary care doctor in 6 months for monitoring her MGUS labs Return to clinic with Dr.  in 12 months with labs 1 week prior  . The total time spent in the appointment was 20 minutes and more than 50% was on counseling and direct patient cares.  All of the patient's questions were answered with apparent satisfaction. The patient knows to call the clinic with any problems, questions or concerns.    MD MS AAHIVMS [facility] Hematology/Oncology Physician [facility]    I,Zite Okoli,acting as a scribe for  , MD.,have documented all relevant  documentation on the behalf of  , MD,as directed by   , MD while in the presence of  , MD.    .I have reviewed the above documentation for accuracy and completeness, and I agree with the above. . Kishore  MD  

## 2021-04-13 NOTE — Progress Notes (Signed)
Chief Complaint  Patient presents with   Medicare Wellness    Nonfasting AWV/CPE with pelvic. Did not see surgeon for gallbladder, ended up seeing chiro for C3,4 and 5 and is better. Released from Dr. Arlean Hopping care-no longer seeing at this time. Will take flu vaccine, I declined covid in chart. Will get shingles vaccine sometime after trip. Still has occasional burning when she urinates from time to time. Went to [place] end of Sept for another fire ant bite attack.     Lindsey Rodriguez is a 55 y.o. female who presents for annual physical exam, Medicare wellness visit and follow-up on chronic medical conditions.    03/31/21 she went to [facility] with urinary complaints.  She was treated with macrobid (and diflucan) for UTI.  Symptoms improved, just occasional burning.  ER visit 9/14 with right flank pain. US showed: IMPRESSION: Cholelithiasis, without associated sonographic findings to suggest acute cholecystitis. Common duct measures 6 mm, within normal limits. Hyperechoic parenchyma, suggesting hepatic steatosis.   CT showed nephrolithiasis (bilateral), no ureteral calculus or hydronephrosis.  She saw chiropractor, pain resolved, never saw surgeons. No further flank pain, denies any abdominal pain.  Hyperlipidemia: This is managed by Dr. Einar Gip. She was started on Vytorin 10/20 per Dr. Einar Gip. She couldn't tolerate taking it daily, it was decreased to 3x/week.  Lipids in 04/2020 were above goal. She ultimately wasn't able to tolerate it due to myalgias, and was switched to atorvastatin 10 mg in 10/2020.  She was supposed to get lipids repeated in July. She can't recall if she had labs repeated.  She has appt with Dr. Einar Gip 05/04/21.  Her personal notes stated she stopped lipitor due to muscle aches and body pains (didn't date her note). She is not fasting today.  She continues to eat red meat 3-4 times/week, uses ground [place] a lot. Some burgers. +butter. Uses coconut oil. 3-4 eggs/week.    Lab Results  Component Value Date   CHOL 205 (H) 04/05/2020   HDL 64 04/05/2020   LDLCALC 119 (H) 04/05/2020   TRIG 125 04/05/2020   CHOLHDL 3.2 04/05/2020     [name] and nonischemic cardiomyopathy, under the care of Dr. Einar Gip.  She remains on remains on lisinopril and carvedilol. She last saw Dr. Einar Gip in 10/2020, scheduled to see him in November. Last Echo was 10/2020, no significant change from that done 12/2019. Echocardiogram 10/04/2020:  Left ventricle cavity is normal in size and ventricular wall thickness.  Abnormal septal wall motion due to left bundle branch block and moderate  global hypokinesis. Cannot exclude apical thrombus. LVEF 35-40%. Grade 1  diastolic function, normal left atrial pressure.  Calculated EF 58%.  Left atrial cavity is moderately dilated.  Mild (grade 1) mitral regurgitation.  No evidence of pulmonary hypertension.  No significant change compared to previous study on 12/30/2019.   No longer having dizzy spells or shortness of breath. Rarely has chest pain--if picking up heavy limbs after a storm.  No pain with routine cardio. BPs are running 112/76-130/80.   She sees Dr. Tomi Likens for her headaches, last seen in 10/2020. She had upper cervical nerve root ablation around December 2020, which had helped some. She is using Tizanidine prn for her headaches, infrequently.  Chronic back pain:  She had L2-3 lateral interbody (Left side approach), L2-3 Posterior Spinal Fusion in January 2022 through [facility] (Dr. Dava Najjar). She still has pain, doing down RLE, with neuropathy in her feet. Had R SI injection 10/2020 without benefit.  Last seen at [facility] 01/2021, reported slowly improving from surgery.  She had CT myelograms of thoracic and lumbar spine in June 2022. Currently she has some pain down her left leg (the right side resolved), more of a weakness than true pain.  She has neuropathy in both feet. Doesn't have f/u scheduled with [facility], plans to "live  with it". Currently pain is 3/10, up to 6-7 when up and moving.  Ice and heat help, medications don't. She reports that her pain keeps her from being able to work even a part-time job.  She can't clean the way she wants to. Sometimes can't bend over to put her shoes on. She became tearful in discussing her pain and limitations.  She reports that 2x/week she feels like she wants to give up, feels very frustrated. Denies SI. Recalls being on cymbalta in the past (but no details about if it helped, why changed). Didn't tolerate lyrica and gabapentin due to sedation.  She continues to see [name] for depression and anxiety. Currently is on 65m of prozac.  Last year was also on abilify and 815mof prozac. Not getting therapy.  Saw someone last summer for a while, wasn't a good fit.  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 KaIrene Limbovery 6 months, seen earlier this week, and has been stable. She was told to f/u in 1 year.   She has been under the care of Dr. DeEstanislado Pandyor chondrocalcinosis, OA, DJD, and fibromyalgia.  She wasn't on medicatons from their office, no longer follows there.    Immunization History  Administered Date(s) Administered   Hepatitis B 10/31/2008   Influenza Split 03/18/2011, 04/02/2014, 05/01/2014   Influenza Whole 07/03/2009   Influenza, Seasonal, Injecte, Preservative Fre 06/04/2012   Influenza,inj,Quad PF,6+ Mos 03/10/2013, 04/07/2015, 03/02/2016, 03/08/2017, 03/13/2018, 04/07/2019, 04/05/2020, 04/14/2021   Influenza-Unspecified 05/03/2012, 04/07/2015, 04/21/2020   MMR [birth year]/1981   Pneumococcal Conjugate-13 03/08/2017   Tdap 07/03/1981, 09/05/2012   She refuses COVID vaccines.  She states a family member died from the vaccine, and daughter has heart failure (had vaccines, and COVID) Last Pap smear: per Dr. HoPhilis Piqueapprox 10/2014; s/p hysterectomy (for benign reasons) Last mammogram: 06/2020 Last colonoscopy: 12/2017, normal (previously had polyp); 5 yr f/u rec per Dr. MaCollene Maresast DEXA:  06/2019, T-1.4 at L wrist Dentist: twice yearly Ophtho: yearly Exercise:  Tries to walk at least 20 minutes 4x/week. No longer sees trainer.  Sporadic weight-bearing exercise.  Vitamin D last checked in 07/2020, normal at 40. She reports she was taking Mg-Ca-D at that time. Recent labs reviewed (c-met, cbc's--from onc, and through [facility] visit)   Patient Care Team: KnRita OharaMD as PCP - General (Family Medicine) JaPieter PartridgeDO as Consulting Physician (Neurology) MaJuanita CraverMD as Consulting Physician (Gastroenterology) GaAdrian ProwsMD as Consulting Physician (Cardiology) KaBrunetta GeneraMD as Consulting Physician (Hematology) CoStarling MannsMD (Orthopedic Surgery) SaZonia KiefMD (Rehabilitation) StErline LevineMD as Consulting Physician (Neurosurgery) AbBlanche EastMD as Consulting Physician (Neurosurgery) HiEunice BlaseMD (FaWeatherfordBlMcarthur RossettiMD as Consulting Physician (Orthopedic Surgery) GoJari PiggMD as Consulting Physician (Dermatology) Puschinsky, RiFransico Him MD (General Surgery) KaChucky MayMD as Consulting Physician (Psychiatry) WeCleaster CorinOD (Optometry) WiGabriela EvesDMD (Dentistry)   Depression Screening: FlStonewall Gapffice Visit from 04/14/2021 in PiErwinPHQ-2 Total Score 0        Falls screen:  Fall Risk  04/14/2021 10/12/2020 04/13/2020 04/05/2020 10/13/2019  Falls in the past year?  0 0 0 0 0  Number falls in past yr: 0 - 0 - 0  Comment - - - - -  Injury with Fall? 0 0 0 - 0  Risk Factor Category  - - - - -  Risk for fall due to : No Fall Risks - - - No Fall Risks  Follow up Falls evaluation completed - - - -    Denies any memory concerns. Denies any issues with ADLs.   End of Life Discussion:  Patient has a living will and medical power of attorney  PMH, PSH, SH and FH were reviewed and updated.  Outpatient Encounter Medications as of 04/14/2021  Medication  Sig Note   carvedilol (COREG) 6.25 MG tablet TAKE 1 TABLET BY MOUTH TWICE DAILY WITH A MEAL    FLUoxetine (PROZAC) 40 MG capsule Take 40 mg by mouth daily.    lisinopril (ZESTRIL) 10 MG tablet Take 1 tablet (10 mg total) by mouth daily.    EPINEPHrine 0.3 mg/0.3 mL IJ SOAJ injection Inject 0.3 mg into the muscle as needed for anaphylaxis. (Patient not taking: Reported on 04/14/2021)    LORazepam (ATIVAN) 0.5 MG tablet Take 1-2 tablets (0.5-1 mg total) by mouth 3 (three) times daily as needed for anxiety. Prn anxiety (Patient not taking: Reported on 04/14/2021) 02/03/2021: As needed    tiZANidine (ZANAFLEX) 2 MG tablet Take 1 tablet (2 mg total) by mouth every 6 (six) hours as needed for muscle spasms. (Patient not taking: No sig reported) 04/14/2021: Uses prn for headaches, 2x/month   [DISCONTINUED] diphenhydrAMINE (BENADRYL) 25 MG tablet Take 25 mg by mouth every 6 (six) hours as needed. 02/03/2021: Last dose 7am    [DISCONTINUED] diphenhydramine-calamine (CALADRYL) 1-8 % LOTN Apply topically 2 (two) times daily as needed.    [DISCONTINUED] hydrocortisone cream 1 % Apply 1 application topically as needed for itching.    [DISCONTINUED] MISC NATURAL PRODUCT OP Take 2 capsules by mouth daily. Omega-Q    [DISCONTINUED] MISC NATURAL PRODUCTS PO Take 1 capsule by mouth 2 (two) times daily. Optimal- V    [DISCONTINUED] MISC NATURAL PRODUCTS PO Take 1 capsule by mouth 2 (two) times daily. Optimal-M    [DISCONTINUED] MISC NATURAL PRODUCTS PO Take 1 capsule by mouth 2 (two) times daily. MagnaCal-D 06/20/2017: 2 capsules twice daily   No facility-administered encounter medications on file as of 04/14/2021.   Allergies  Allergen Reactions   Penicillins Shortness Of Breath and Rash    Has patient had a PCN reaction causing immediate rash, facial/tongue/throat swelling, SOB or lightheadedness with hypotension: no Has patient had a PCN reaction causing severe rash involving mucus membranes or skin necrosis:  no Has patient had a PCN reaction that required hospitalization no Has patient had a PCN reaction occurring within the last 10 years: no If all of the above answers are "NO", then may proceed with Cephalosporin use.  Other reaction(s): Unknown   Codeine Other (See Comments)    Manic depressive Other reaction(s): Unknown   Voltaren [Diclofenac] Nausea And Vomiting and Other (See Comments)    Pt stated "tore stomach up"   Crestor [Rosuvastatin] Other (See Comments)    Myalgia   Diclofenac Sodium    Vytorin [Ezetimibe-Simvastatin] Other (See Comments)    Severe myalgia   Erythromycin Rash    Other reaction(s): Unknown    ROS:  The patient denies anorexia, fever, vision changes, decreased hearing, ear pain, sore throat, breast concerns, chest pain, palpitations, syncope (none since 08/2016), cough, swelling, nausea,  vomiting, diarrhea, constipation, abdominal pain, melena, hematochezia, indigestion/heartburn, hematuria, vaginal bleeding, discharge, odor or itch, genital lesions, tremor, suspicious skin lesions, depression, abnormal bleeding/bruising, or enlarged lymph nodes. Sees dermatologist regularly Some vaginal dryness.  Denies hot flashes or night sweats. Occasional burning with urination (in the last 2 weeks, recently treated for infection) Neuropathy in feet--cold, tingly Headaches (2x/month, managed with tizanidine), back pain per HPI. Some neck pain--occasionally takes an advil and it helps, along with icing. Depression/anxiety stable overall. Under the care of psych. No longer having dizziness, chest pain or shortness of breath.    PHYSICAL EXAM:  BP 120/80   Pulse 72   Ht 5' 6"  (1.676 m)   Wt 177 lb 3.2 oz (80.4 kg)   LMP 12/16/2012   BMI 28.60 kg/m   Wt Readings from Last 3 Encounters:  04/14/21 177 lb 3.2 oz (80.4 kg)  04/12/21 177 lb 14.4 oz (80.7 kg)  02/03/21 174 lb 3.2 oz (79 kg)    General Appearance:    Alert, cooperative, no distress, appears stated  age.  Head:    Normocephalic, without obvious abnormality, atraumatic, nontender  Eyes:    PERRL, conjunctiva/corneas clear, EOM's intact, fundi benign  Ears:    Normal TM's and external ear canals  Nose:   Not examined, wearing mask due to COVID-19 pandemic  Throat:   Not examined, wearing mask due to COVID-19 pandemic  Neck:   Supple, no lymphadenopathy;  thyroid:  no enlargement/ tenderness/nodules; no carotid bruit or JVD. WHSS anteriorly, horizontal, and vertical posterior WHSS. Prominence of [place] joints L>R, nontender  Back:   No c-spine tenderness or spasm, no trigger points in upper back.  Tender at paraspinous muscles in flank area (lower thoracic spine). No SI tenderness  Lungs:     Clear to auscultation bilaterally without wheezes, rales or ronchi; respirations unlabored  Chest Wall:    No tenderness or deformity   Heart:    regular rate and rhythm, no murmur, rub or gallop  Breast Exam:    no nipple inversion, discharge, skin dimpling, breast masses or axillary lymphadenopathy. Small fibroglandular vs cystic area at 6 o'clock at edge of areola on the R breast, mildly tender. More fibroglandular changes noted througout the L breast.  Abdomen:     Soft, non-tender, nondistended, normoactive bowel sounds, no masses, no hepatosplenomegaly  Genitalia:    normal external genitalia without lesions.  Normal bimanual exam.  Uterus surgically absent. No adnexal masses or tenderness  Rectal:   Normal sphincter tone, no mass. Heme negative stool  Extremities:   No clubbing, cyanosis or edema.  Pulses:   2+ and symmetric all extremities  Skin:   Skin color, texture, turgor normal, lesions. Healing papules on toes of L foot, and some skin peeling (healing from recent fire ant bites.)  Lymph nodes:   Cervical, supraclavicular, inguinal and axillary nodes normal  Neurologic:   Normal strength, sensation and gait; reflexes 1+ and symmetric              Psych:   Normal mood, affect, hygiene and  grooming. She became tearful in discussing her chronic pain.   Urine:  trace protein, trace blood.   ASSESSMENT/PLAN:  Annual physical exam - Plan: POCT Urinalysis DIP (Proadvantage Device)  Medicare annual wellness visit, subsequent  Nonischemic cardiomyopathy ([place]) - stable, asymptomatic, under care of Dr. Einar Gip.  HTN well controlled. Not currently on lipid-lowering therapy, needs recheck and f/u with cardiology  Mixed hyperlipidemia - pt didn't tolerate Vytorin  or lipitor.  Needs to f/u with cardiology (not fasting today)  MGUS (monoclonal gammopathy of unknown significance) - stable  Chronic migraine without aura without status migrainosus, not intractable - under care of Dr. Tomi Likens, doing well with just prn tizanidine, not frequent  Fibromyalgia - no longer sees Dr. Estanislado Pandy.  Has chronic back pian, and some neck pain  Vitamin D deficiency - continue supplements  Need for influenza vaccination - Plan: Flu Vaccine QUAD 6+ mos PF IM (Fluarix Quad PF)  Depression, major, single episode, in partial remission ([place]) - she has chronic pain, was tearful. Encouraged her to f/u with psych, and also for counseling.  Vaccine counseling - discussed COVID vaccines (refuses, blames vaccines for family members illnesses/death), and need for testing early for treatments if sick  Discussed monthly self breast exams and yearly mammograms; at least 30 minutes of aerobic activity at least 5 days/week, weight-bearing exercise at least 2x/wk; proper sunscreen use reviewed; healthy diet, including goals of calcium and vitamin D intake and alcohol recommendations (less than or equal to 1 drink/day) reviewed; regular seatbelt use; changing batteries in smoke detectors.  Immunization recommendations discussed--continue yearly flu shots. Shingrix recommended needs to get from pharmacy [facility]). COVID recommended, declined by pt. Pneumovax age 65. Colonoscopy recommendations reviewed, UTD, due again 01/2023.    Patient is full code, full care (elected not to complete MOST form).   F/u 1 year, sooner prn.    Medicare Attestation I have personally reviewed: The patient's medical and social history Their use of alcohol, tobacco or illicit drugs Their current medications and supplements The patient's functional ability including ADLs,fall risks, home safety risks, cognitive, and hearing and visual impairment Diet and physical activities Evidence for depression or mood disorders  The patient's weight, height, BMI have been recorded in the chart.  I have made referrals, counseling, and provided education to the patient based on review of the above and I have provided the patient with a written personalized care plan for preventive services.

## 2021-04-13 NOTE — Patient Instructions (Addendum)
  HEALTH MAINTENANCE RECOMMENDATIONS:  It is recommended that you get at least 30 minutes of aerobic exercise at least 5 days/week (for weight loss, you may need as much as 60-90 minutes). This can be any activity that gets your heart rate up. This can be divided in 10-15 minute intervals if needed, but try and build up your endurance at least once a week.  Weight bearing exercise is also recommended twice weekly.  Eat a healthy diet with lots of vegetables, fruits and fiber.  "Colorful" foods have a lot of vitamins (ie green vegetables, tomatoes, red peppers, etc).  Limit sweet tea, regular sodas and alcoholic beverages, all of which has a lot of calories and sugar.  Up to 1 alcoholic drink daily may be beneficial for women (unless trying to lose weight, watch sugars).  Drink a lot of water.  Calcium recommendations are 1200-1500 mg daily (1500 mg for postmenopausal women or women without ovaries), and vitamin D 1000 IU daily.  This should be obtained from diet and/or supplements (vitamins), and calcium should not be taken all at once, but in divided doses.  Monthly self breast exams and yearly mammograms for women over the age of 44 is recommended.  Sunscreen of at least SPF 30 should be used on all sun-exposed parts of the skin when outside between the hours of 10 am and 4 pm (not just when at beach or pool, but even with exercise, golf, tennis, and yard work!)  Use a sunscreen that says "broad spectrum" so it covers both UVA and UVB rays, and make sure to reapply every 1-2 hours.  Remember to change the batteries in your smoke detectors when changing your clock times in the spring and fall. Carbon monoxide detectors are recommended for your home.  Use your seat belt every time you are in a car, and please drive safely and not be distracted with cell phones and texting while driving.   Ms. Toutant , Thank you for taking time to come for your Medicare Wellness Visit. I appreciate your ongoing  commitment to your health goals. Please review the following plan we discussed and let me know if I can assist you in the future.   This is a list of the screening recommended for you and due dates:  Health Maintenance  Topic Date Due   COVID-19 Vaccine (1) Never done   Zoster (Shingles) Vaccine (1 of 2) Never done   Flu Shot  01/31/2021   Mammogram  06/29/2022   Tetanus Vaccine  09/06/2022   Colon Cancer Screening  01/22/2023   Hepatitis C Screening: USPSTF Recommendation to screen - Ages 18-79 yo.  Completed   HIV Screening  Completed   HPV Vaccine  Aged Out    I recommend getting the new shingles vaccine (Shingrix). Since you have Medicare, you will need to get this from the pharmacy, as it is covered by Part D. This is a series of 2 injections, spaced 2 months apart.   This should be separated from other vaccines by at least 2 weeks.   I encourage you to look into counseling for the depression, since it appears that your pain is something that isn't going to go away, and is contributing to your depression and frustration.  Please resume daily vitamin D (separate D3 or in combination with calcium).  Please be fasting for your visit with the cardiologist as your cholesterol is due to be rechecked.

## 2021-04-14 ENCOUNTER — Other Ambulatory Visit: Payer: Self-pay

## 2021-04-14 ENCOUNTER — Encounter: Payer: Self-pay | Admitting: Family Medicine

## 2021-04-14 ENCOUNTER — Ambulatory Visit: Payer: Medicare PPO | Admitting: Family Medicine

## 2021-04-14 VITALS — BP 120/80 | HR 72 | Ht 66.0 in | Wt 177.2 lb

## 2021-04-14 DIAGNOSIS — M797 Fibromyalgia: Secondary | ICD-10-CM | POA: Diagnosis not present

## 2021-04-14 DIAGNOSIS — F324 Major depressive disorder, single episode, in partial remission: Secondary | ICD-10-CM

## 2021-04-14 DIAGNOSIS — G43709 Chronic migraine without aura, not intractable, without status migrainosus: Secondary | ICD-10-CM | POA: Diagnosis not present

## 2021-04-14 DIAGNOSIS — I428 Other cardiomyopathies: Secondary | ICD-10-CM

## 2021-04-14 DIAGNOSIS — Z0000 Encounter for general adult medical examination without abnormal findings: Secondary | ICD-10-CM | POA: Diagnosis not present

## 2021-04-14 DIAGNOSIS — Z7185 Encounter for immunization safety counseling: Secondary | ICD-10-CM

## 2021-04-14 DIAGNOSIS — E782 Mixed hyperlipidemia: Secondary | ICD-10-CM | POA: Diagnosis not present

## 2021-04-14 DIAGNOSIS — D472 Monoclonal gammopathy: Secondary | ICD-10-CM | POA: Diagnosis not present

## 2021-04-14 DIAGNOSIS — Z23 Encounter for immunization: Secondary | ICD-10-CM | POA: Diagnosis not present

## 2021-04-14 DIAGNOSIS — E559 Vitamin D deficiency, unspecified: Secondary | ICD-10-CM

## 2021-04-14 LAB — POCT URINALYSIS DIP (PROADVANTAGE DEVICE)
Bilirubin, UA: NEGATIVE
Glucose, UA: NEGATIVE mg/dL
Ketones, POC UA: NEGATIVE mg/dL
Leukocytes, UA: NEGATIVE
Nitrite, UA: NEGATIVE
Specific Gravity, Urine: 1.015
Urobilinogen, Ur: NEGATIVE
pH, UA: 6 (ref 5.0–8.0)

## 2021-04-16 ENCOUNTER — Encounter: Payer: Self-pay | Admitting: Family Medicine

## 2021-05-04 ENCOUNTER — Encounter: Payer: Self-pay | Admitting: Family Medicine

## 2021-05-04 ENCOUNTER — Ambulatory Visit: Payer: Medicare PPO | Admitting: Cardiology

## 2021-05-04 ENCOUNTER — Other Ambulatory Visit: Payer: Self-pay

## 2021-05-04 ENCOUNTER — Encounter: Payer: Self-pay | Admitting: Cardiology

## 2021-05-04 VITALS — BP 132/81 | Temp 98.1°F | Resp 16 | Ht 66.0 in | Wt 180.0 lb

## 2021-05-04 DIAGNOSIS — E7800 Pure hypercholesterolemia, unspecified: Secondary | ICD-10-CM

## 2021-05-04 DIAGNOSIS — I428 Other cardiomyopathies: Secondary | ICD-10-CM | POA: Diagnosis not present

## 2021-05-04 DIAGNOSIS — I447 Left bundle-branch block, unspecified: Secondary | ICD-10-CM | POA: Diagnosis not present

## 2021-05-04 DIAGNOSIS — I10 Essential (primary) hypertension: Secondary | ICD-10-CM

## 2021-05-04 MED ORDER — SACUBITRIL-VALSARTAN 24-26 MG PO TABS: 1.0000 | ORAL_TABLET | Freq: Two times a day (BID) | ORAL | Status: DC

## 2021-05-04 NOTE — Progress Notes (Signed)
Primary Physician/Referring:  Rita Ohara, MD  Patient ID: Lindsey Rodriguez, female    DOB: [birth year], 55 y.o.   MRN: [id number]  No chief complaint on file.  HPI:    Lindsey Rodriguez  is a 55 y.o.  Caucasian female, with frequent PVCs and  new onset left bundle branch block and non ischemic cardiomyopathy diagnosed in 2017 with severe LV systolic dysfunction, EF has improved with medical therapy to around 40 to 45%.  She has hypertension and hyperlipidemia.  She has also been diagnosed with IgG lambda MGUS, small fiber neuropathy, anxiety and depression, panic attacks and severe degenerative L5S1 spine disease and also fibromyalgia. She underwent L4-S1 fusion in January 2022. This is her 30-month office visit.  Patient has not been able to tolerate higher doses of lisinopril or carvedilol.  However she does state that she gives up easily and it is extremely difficult to make out her symptoms as she has fibromyalgias, back pain, and her fatigue could be deconditioning as well.   She has not been able to tolerate Crestor, Vytorin and also Lipitor due to severe muscle aches and cramps.     Past Medical History:  Diagnosis Date  . Anemia   . Anxiety panic attacks   [name]  . Arthritis    knees  . Cardiomyopathy, nonischemic ([place]) 04/07/2016   EF left ventricule calculated on Lexiscan stress test at 33% with global hypokinesis.   . Chronic back pain   . Chronic neck pain   . Depression   . Dysrhythmia    pvc's with cardiomyopathy  . Family history of adverse reaction to anesthesia    mother has problems waking up  . Fibromyalgia    and neuropathy of feet  . History of kidney stones    lithotripsy  03/2016  . Hyperlipidemia   . Hypertension   . Kidney stone    History  . Kidney stones h/o  . Melanoma ([place]) RUE   [name]  . MGUS (monoclonal gammopathy of unknown significance) 06/2015   no treatment just watching recheck blood work 12/2015  . Migraine    OTC med prn -  stress related  . Plantar fasciitis right  . Renal stones 2008/2011/2017  . Seasonal allergies   . SVD (spontaneous vaginal delivery)    x 3  . Tendonitis of foot right   Past Surgical History:  Procedure Laterality Date  . ABLATION ON ENDOMETRIOSIS N/A 11/24/2015   Procedure: ABLATION ON ENDOMETRIOSIS;  Surgeon: [name], MD;  Location: [facility];  Service: Gynecology;  Laterality: N/A;  . ANTERIOR LAT LUMBAR FUSION Right 06/15/2016   Procedure: RIGHT LUMBAR THREE-FOUR ANTEROLATERAL LUMBAR INTERBODY FUSION WITH LATERAL PLATE;  Surgeon: Erline Levine, MD;  Location: [place];  Service: Neurosurgery;  Laterality: Right;  . BACK SURGERY  Dr.Stern, last sx Summer 2011   X 4  (L3-S1)  . BACK SURGERY  06/2016  . BACK SURGERY  07/20/2020   L2-3 lateral interbody (Left side approach), L2-3 Posterior Spinal Fusion at [facility]  . BLADDER SUSPENSION N/A 11/24/2015   Procedure: TRANSVAGINAL TAPE (TVT) PROCEDURE;  Surgeon: [name], MD;  Location: [facility];  Service: Gynecology;  Laterality: N/A;  . C2-3 nerve ablation Right 06/2019   Dr. Maia Petties (per pt)  . COLONOSCOPY    . CYSTOCELE REPAIR  11/24/2015   Procedure: ANTERIOR REPAIR (CYSTOCELE);  Surgeon: [name], MD;  Location: [facility];  Service: Gynecology;;  . Consuela Mimes N/A 11/24/2015   Procedure: CYSTOSCOPY;  Surgeon: [name], MD;  Location: [facility];  Service: Gynecology;  Laterality: N/A;  . DILATION AND CURETTAGE OF UTERUS    . ENDOMETRIAL ABLATION  08/2000  . EYE SURGERY Bilateral    Lasik   . KIDNEY STONE SURGERY  2011  . LITHOTRIPSY  08/2017 (L), 03/2018 (R)  . MELANOMA EXCISION  05/2015   right forearm; [name]  . NECK SURGERY  [name]  Summer 2011   X 4  (C4-T1 Level)  . RHINOPLASTY    . ROBOTIC ASSISTED BILATERAL SALPINGO OOPHERECTOMY Bilateral 11/24/2015   Procedure: ROBOTIC ASSISTED BILATERAL SALPINGO OOPHORECTOMY;  Surgeon: [name], MD;  Location: [facility];  Service: Gynecology;  Laterality:  Bilateral;--OVARIES NOT REMOVED PER PATH REPORT  . ROBOTIC ASSISTED TOTAL HYSTERECTOMY WITH SALPINGECTOMY Bilateral 11/24/2015   Procedure: ROBOTIC ASSISTED TOTAL HYSTERECTOMY WITH SALPINGECTOMY;  Surgeon: [name], MD;  Location: [facility];  Service: Gynecology;  Laterality: Bilateral;  . TONSILLECTOMY    . TOTAL VAGINAL HYSTERECTOMY    . WISDOM TOOTH EXTRACTION     Social History   Tobacco Use  . Smoking status: Former    Packs/day: 0.10    Years: 3.00    Pack years: 0.30    Types: Cigarettes    Quit date: 07/04/1987    Years since quitting: 33.8  . Smokeless tobacco: Never  Substance Use Topics  . Alcohol use: Yes    Comment: 2/week   Marital Status: Married   ROS  Review of Systems  Constitutional: Positive for malaise/fatigue.  Cardiovascular:  Negative for dyspnea on exertion.  Musculoskeletal:  Positive for back pain, joint pain, muscle weakness and myalgias.  Neurological:  Positive for dizziness.  Psychiatric/Behavioral:  Positive for depression. The patient is nervous/anxious.   Objective   Vitals with BMI 05/04/2021 04/14/2021 04/12/2021  Height 5\' 6"  5\' 6"  5\' 7"   Weight 180 lbs 177 lbs 3 oz 177 lbs 14 oz  BMI 29.07 29.79 89.21  Systolic 194 174 081  Diastolic 81 80 67  Pulse - 72 70     Physical Exam Constitutional:      Appearance: She is well-developed and normal weight.  Cardiovascular:     Rate and Rhythm: Normal rate and regular rhythm.     Pulses: Intact distal pulses.     Heart sounds: Murmur heard.  Midsystolic murmur is present with a grade of 2/6 radiating to the apex.    No gallop.     Comments: No leg edema, no JVD. Pulmonary:     Effort: Pulmonary effort is normal.     Breath sounds: Normal breath sounds.  Abdominal:     General: Bowel sounds are normal.     Palpations: Abdomen is soft.  Musculoskeletal:     Cervical back: Normal range of motion.  Neurological:     General: No focal deficit present.     Mental Status: She is alert  and oriented to person, place, and time.  Psychiatric:        Mood and Affect: Mood normal.   Laboratory examination:   Recent Labs    08/31/20 0911 03/08/21 1012  NA 139 139  K 4.5 4.9  CL 104 103  CO2 25 26  GLUCOSE 91 93  BUN 18 13  CREATININE 0.88 0.83  CALCIUM 10.0 10.2  GFRNONAA >60 >60   CrCl cannot be calculated (Patient's most recent lab result is older than the maximum 21 days allowed.).  CMP Latest Ref Rng & Units 03/08/2021 08/31/2020 02/11/2020  Glucose 70 - 99 mg/dL 93 91 86  BUN 6 - 20 mg/dL 13 18 12   Creatinine 0.44 - 1.00 mg/dL 0.83 0.88 0.71  Sodium 135 - 145 mmol/L 139 139 134  Potassium 3.5 - 5.1 mmol/L 4.9 4.5 5.0  Chloride 98 - 111 mmol/L 103 104 99  CO2 22 - 32 mmol/L 26 25 24   Calcium 8.9 - 10.3 mg/dL 10.2 10.0 9.6  Total Protein 6.5 - 8.1 g/dL 8.3(H) 8.5(H) 7.5  Total Bilirubin 0.3 - 1.2 mg/dL 0.6 0.5 0.5  Alkaline Phos 38 - 126 U/L 70 73 65  AST 15 - 41 U/L 26 30 26   ALT 0 - 44 U/L 39 39 27   CBC Latest Ref Rng & Units 03/08/2021 08/31/2020 01/15/2020  WBC 4.0 - 10.5 K/uL 4.6 3.3(L) 5.2  Hemoglobin 12.0 - 15.0 g/dL 13.0 12.7 13.1  Hematocrit 36.0 - 46.0 % 38.8 38.2 38.2  Platelets 150 - 400 K/uL 284 262 226   Lipid Panel No results for input(s): CHOL, TRIG, LDLCALC, VLDL, HDL, CHOLHDL, LDLDIRECT in the last 8760 hours.   Cholesterol, total 205.000 m 04/05/2020 HDL 64.000 mg 04/05/2020 LDL 119.000 m 04/05/2020 Triglycerides 125.000 m 04/05/2020  HEMOGLOBIN A1C Lab Results  Component Value Date   HGBA1C 5.5 01/27/2013   TSH No results for input(s): TSH in the last 8760 hours.  Medications and allergies   Allergies  Allergen Reactions  . Penicillins Shortness Of Breath and Rash    Has patient had a PCN reaction causing immediate rash, facial/tongue/throat swelling, SOB or lightheadedness with hypotension: no Has patient had a PCN reaction causing severe rash involving mucus membranes or skin necrosis: no Has patient had a PCN reaction that  required hospitalization no Has patient had a PCN reaction occurring within the last 10 years: no If all of the above answers are "NO", then may proceed with Cephalosporin use.  Other reaction(s): Unknown  . Codeine Other (See Comments)    Manic depressive Other reaction(s): Unknown  . Voltaren [Diclofenac] Nausea And Vomiting and Other (See Comments)    Pt stated "tore stomach up"  . Crestor [Rosuvastatin] Other (See Comments)    Myalgia  . Diclofenac Sodium   . Vytorin [Ezetimibe-Simvastatin] Other (See Comments)    Severe myalgia  . Erythromycin Rash    Other reaction(s): Unknown    Current Outpatient Medications  Medication Instructions  . carvedilol (COREG) 6.25 MG tablet TAKE 1 TABLET BY MOUTH TWICE DAILY WITH A MEAL  . EPINEPHrine (EPI-PEN) 0.3 mg, Intramuscular, As needed  . FLUoxetine (PROZAC) 40 mg, Oral, Daily  . LORazepam (ATIVAN) 0.5-1 mg, Oral, 3 times daily PRN, Prn anxiety  . tiZANidine (ZANAFLEX) 2 mg, Oral, Every 6 hours PRN   Radiology:  No results found. Cardiac Studies:   Lexiscan myoview stress test 04/07/2016: 1. Resting EKG demonstrates normal sinus rhythm with frequent PVCs in the pattern of ventricular bigeminy.  Low-voltage complexes.  PVCs decreased in the recovery phase of Lexiscan stress test. 2. Left ventricular cavity is noted to be mildly dilated with LV end diastolic volume of 937 mL on the rest and stress studies. Marland Kitchen SPECT images demonstrate homogeneous tracer distribution throughout the myocardium.  The left ventricular ejection fraction was calculated or visually estimated to be 33% with global hypokinesis. Findings are consistent with nonischemic cardiomyopathy.  Holter monitor 06/06/2017: Sinus rhythm.  Minimum heart rate 54 bpm, average heart rate 71 bpm, maximum heart rate 102 bpm.  Total ventricular ectopy 3.3%.  Less than 1%  supraventricular ectopy  No atrial fibrillation, atrial flutter, ventricular tachycardia.  Echocardiogram  10/04/2020: Left ventricle cavity is normal in size and ventricular wall thickness. Abnormal septal wall motion due to left bundle branch block and moderate global hypokinesis. Cannot exclude apical thrombus. LVEF 35-40%. Grade 1 diastolic function, normal left atrial pressure.  Calculated EF 58%. Left atrial cavity is moderately dilated. Mild (grade 1) mitral regurgitation.  No evidence of pulmonary hypertension. No significant change compared to previous study on 12/30/2019.   EKG:   EKG 05/04/2021: Normal sinus rhythm at rate of 59 bpm, left bundle branch block.  No further analysis.  No significant change from prior EKG 11/01/2020. No significant change from 12/23/2019.    Assessment     ICD-10-CM   1. Nonischemic cardiomyopathy (HCC)  I42.8 EKG 12-Lead    sacubitril-valsartan (ENTRESTO) 24-26 mg per tablet    2. Essential hypertension, benign  A57 Basic metabolic panel    3. LBBB (left bundle branch block)  I44.7     4. Hypercholesteremia  E78.00 Lipid Panel With LDL/HDL Ratio     Recommendations:   Meds ordered this encounter  Medications  . sacubitril-valsartan (ENTRESTO) 24-26 mg per tablet    Order Specific Question:   ACE-inhibitors have NOT been administered in the past 36-hours.    Answer:   NO (plan to delay first Entresto dose to comply)     DOLORAS TELLADO  is a 55 y.o. Caucasian female, with frequent PVCs and  new onset left bundle branch block and non ischemic cardiomyopathy diagnosed in 2017 with severe LV systolic dysfunction, EF has improved with medical therapy to around 40 to 45%.  She has hypertension and hyperlipidemia.  She has also been diagnosed with IgG lambda MGUS, small fiber neuropathy, anxiety and depression, panic attacks and severe degenerative L5S1 spine disease and also fibromyalgia. She underwent L4-S1 fusion in January 2022. This is her 40-month office visit.  Patient has not been able to tolerate higher doses of lisinopril or carvedilol.  However  she does state that she gives up easily and it is extremely difficult to make out her symptoms as she has fibromyalgias, back pain, and her fatigue could be deconditioning as well.  There is no clinical evidence of heart failure.  I would like to try Entresto to see if her LVEF would improve, she is currently on lisinopril 10 mg daily, she has been advised to discontinue this and not start the [facility] for 4 days.  Clear-cut instructions given, samples given.  I would like to see her back in 2 weeks for up titration of Entresto.  She will obtain lipid profile and also BMP in 10 days after starting the medication.  With regard to hyperlipidemia, she has not been able to tolerate Crestor in the past and now she is having severe myalgias with Vytorin.  She tried Lipitor previously and this also caused her to have myalgias.  We could try Livalo.   I will obtain baseline lipids first.   Adrian Prows, MD, [facility] 05/04/2021, 10:28 AM Office: [phone]

## 2021-05-04 NOTE — Telephone Encounter (Signed)
From patient.

## 2021-05-10 NOTE — Telephone Encounter (Signed)
From pt

## 2021-05-11 ENCOUNTER — Other Ambulatory Visit: Payer: Self-pay | Admitting: Cardiology

## 2021-05-11 DIAGNOSIS — I10 Essential (primary) hypertension: Secondary | ICD-10-CM

## 2021-05-18 DIAGNOSIS — E7800 Pure hypercholesterolemia, unspecified: Secondary | ICD-10-CM | POA: Diagnosis not present

## 2021-05-18 DIAGNOSIS — I10 Essential (primary) hypertension: Secondary | ICD-10-CM | POA: Diagnosis not present

## 2021-05-19 ENCOUNTER — Ambulatory Visit: Payer: Medicare PPO | Admitting: Student

## 2021-05-19 LAB — BASIC METABOLIC PANEL
BUN/Creatinine Ratio: 17 (ref 9–23)
BUN: 12 mg/dL (ref 6–24)
CO2: 23 mmol/L (ref 20–29)
Calcium: 9.6 mg/dL (ref 8.7–10.2)
Chloride: 102 mmol/L (ref 96–106)
Creatinine, Ser: 0.69 mg/dL (ref 0.57–1.00)
Glucose: 99 mg/dL (ref 70–99)
Potassium: 4.7 mmol/L (ref 3.5–5.2)
Sodium: 139 mmol/L (ref 134–144)
eGFR: 102 mL/min/{1.73_m2} (ref >59)

## 2021-05-19 LAB — LIPID PANEL WITH LDL/HDL RATIO
Cholesterol, Total: 238 mg/dL — ABNORMAL HIGH (ref 100–199)
HDL: 38 mg/dL — ABNORMAL LOW (ref >39)
LDL Chol Calc (NIH): 158 mg/dL — ABNORMAL HIGH (ref 0–99)
LDL/HDL Ratio: 4.2 ratio — ABNORMAL HIGH (ref 0.0–3.2)
Triglycerides: 226 mg/dL — ABNORMAL HIGH (ref 0–149)
VLDL Cholesterol Cal: 42 mg/dL — ABNORMAL HIGH (ref 5–40)

## 2021-05-19 NOTE — Progress Notes (Signed)
She is seeing you on the 21st, I was planning on starting her on Livalo 2 mg daily which I have sent a prescription if I am not mistaken, (Zypitamag 4 mg 1/2 daily) and she will need repeat testing in 2 months. We could also add Zetia now if patient is okay

## 2021-05-20 NOTE — Progress Notes (Signed)
Primary Physician/Referring:  Rita Ohara, MD  Patient ID: Lindsey Rodriguez, female    DOB: 03/07/1966, 55 y.o.   MRN: [id number]  Chief Complaint  Patient presents with   Cardiomyopathy   Hypertension        Follow-up   Shortness of Breath    HPI:    Lindsey Rodriguez  is a 55 y.o.  Caucasian female, with frequent PVCs and  new onset left bundle branch block and non ischemic cardiomyopathy diagnosed in 2017 with severe LV systolic dysfunction, EF has improved with medical therapy to around 40 to 45%.  She has hypertension and hyperlipidemia.  She has also been diagnosed with IgG lambda MGUS, small fiber neuropathy, anxiety and depression, panic attacks and severe degenerative L5S1 spine disease and also fibromyalgia. She underwent L4-S1 fusion in January 2022.  Notably patient has been unable to tolerate higher doses of lisinopril or carvedilol in the past.  Patient presents for 2-week follow-up.  At last office visit switch patient from lisinopril to [facility], repeat BMP remained stable.  Also ordered repeat lipid profile testing at last office visit Which noted lipids are uncontrolled with LDL at 158 and triglycerides 226. Patient reports over the last 2 weeks since starting Entresto she has developed exertional chest discomfort which she describes as pressure occurring at least twice daily and relieved by rest. She also reports episodes of shortness of breath upon standing and generalized fatigue.   She is not having active chest discomfort. Denies dizziness, syncope, near-syncope. She notably has no formal exercise routine. 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   Patient has not been able to tolerate higher doses of lisinopril or carvedilol.  However she does state that she gives up easily and it is extremely difficult to make out her symptoms as she has fibromyalgias, back pain, and her  fatigue could be deconditioning as well.   She has not been able to tolerate Crestor, Vytorin and also Lipitor due to severe muscle aches and cramps.     Past Medical History:  Diagnosis Date   Anemia    Anxiety panic attacks   [name]   Arthritis    knees   Cardiomyopathy, nonischemic ([place]) 04/07/2016   EF left ventricule calculated on Lexiscan stress test at 33% with global hypokinesis.    Chronic back pain    Chronic neck pain    Depression    Dysrhythmia    pvc's with cardiomyopathy   Family history of adverse reaction to anesthesia    mother has problems waking up   Fibromyalgia    and neuropathy of feet   History of kidney stones    lithotripsy  03/2016   Hyperlipidemia    Hypertension    Kidney stone    History   Kidney stones h/o   Melanoma ([place]) RUE   [name]   MGUS (monoclonal gammopathy of unknown significance) 06/2015   no treatment just watching recheck blood work 12/2015   Migraine    OTC med prn - stress related   Plantar fasciitis right   Renal stones 2008/2011/2017   Seasonal allergies    SVD (spontaneous vaginal delivery)    x 3   Tendonitis of foot right   Past Surgical History:  Procedure Laterality Date   ABLATION ON ENDOMETRIOSIS N/A 11/24/2015   Procedure: ABLATION ON ENDOMETRIOSIS;  Surgeon: [name], MD;  Location:  [facility];  Service: Gynecology;  Laterality: N/A;   ANTERIOR LAT LUMBAR FUSION Right 06/15/2016   Procedure: RIGHT LUMBAR THREE-FOUR ANTEROLATERAL LUMBAR INTERBODY FUSION WITH LATERAL PLATE;  Surgeon: Erline Levine, MD;  Location: [place];  Service: Neurosurgery;  Laterality: Right;   BACK SURGERY  Dr.Stern, last sx Summer 2011   X 4  (L3-S1)   BACK SURGERY  06/2016   BACK SURGERY  07/20/2020   L2-3 lateral interbody (Left side approach), L2-3 Posterior Spinal Fusion at [place] N/A 11/24/2015   Procedure: TRANSVAGINAL TAPE (TVT) PROCEDURE;  Surgeon: [name], MD;  Location: [facility];  Service:  Gynecology;  Laterality: N/A;   C2-3 nerve ablation Right 06/2019   Dr. Maia Petties (per pt)   COLONOSCOPY     CYSTOCELE REPAIR  11/24/2015   Procedure: ANTERIOR REPAIR (CYSTOCELE);  Surgeon: [name], MD;  Location: [facility];  Service: Gynecology;;   CYSTOSCOPY N/A 11/24/2015   Procedure: Consuela Mimes;  Surgeon: [name], MD;  Location: [facility];  Service: Gynecology;  Laterality: N/A;   DILATION AND CURETTAGE OF UTERUS     ENDOMETRIAL ABLATION  08/2000   EYE SURGERY Bilateral    Lasik    KIDNEY STONE SURGERY  2011   LITHOTRIPSY  08/2017 (L), 03/2018 (R)   MELANOMA EXCISION  05/2015   right forearm; [name]   NECK SURGERY  [name]  Summer 2011   X 4  (C4-T1 Level)   RHINOPLASTY     ROBOTIC ASSISTED BILATERAL SALPINGO OOPHERECTOMY Bilateral 11/24/2015   Procedure: ROBOTIC ASSISTED BILATERAL SALPINGO OOPHORECTOMY;  Surgeon: [name], MD;  Location: [facility];  Service: Gynecology;  Laterality: Bilateral;--OVARIES NOT REMOVED PER PATH REPORT   ROBOTIC ASSISTED TOTAL HYSTERECTOMY WITH SALPINGECTOMY Bilateral 11/24/2015   Procedure: ROBOTIC ASSISTED TOTAL HYSTERECTOMY WITH SALPINGECTOMY;  Surgeon: [name], MD;  Location: [facility];  Service: Gynecology;  Laterality: Bilateral;   TONSILLECTOMY     TOTAL VAGINAL HYSTERECTOMY     WISDOM TOOTH EXTRACTION     Family History  Problem Relation Age of Onset   Hypertension Mother    Depression Mother    Migraines Mother    Hypothyroidism Mother    Hypercholesterolemia Mother    COPD Father    Heart failure Father 82   Healthy Brother    Cardiomyopathy Daughter 60       related to COVID (illness or vaccine?)   Healthy Daughter    Healthy Daughter    Healthy Son    Diabetes Neg Hx    Social History   Tobacco Use   Smoking status: Former    Packs/day: 0.10    Years: 3.00    Pack years: 0.30    Types: Cigarettes    Quit date: 07/04/1987    Years since quitting: 33.9   Smokeless tobacco: Never  Substance Use Topics    Alcohol use: Yes    Comment: 2/week   Marital Status: Married   ROS  Review of Systems  Constitutional: Positive for malaise/fatigue. Negative for weight gain.  Cardiovascular:  Positive for chest pain. Negative for claudication, leg swelling, near-syncope, orthopnea, palpitations, paroxysmal nocturnal dyspnea and syncope.  Respiratory:  Positive for shortness of breath.   Neurological:  Negative for dizziness.  Psychiatric/Behavioral:  Positive for depression. The patient is nervous/anxious.   Objective   Vitals with BMI 05/23/2021 05/04/2021 04/14/2021  Height 5\' 6"  5\' 6"  5\' 6"   Weight 181 lbs 10 oz 180 lbs 177 lbs 3 oz  BMI 29.32  03.47 42.59  Systolic 563 875 643  Diastolic 83 81 80  Pulse 63 - 72    Orthostatic VS for the past 72 hrs (Last 3 readings):  Orthostatic BP Patient Position BP Location Cuff Size Orthostatic Pulse  05/23/21 1028 110/76 Standing Left Arm Normal 69  05/23/21 1027 125/74 Sitting Left Arm Normal 62  05/23/21 1025 120/75 Supine Left Arm Normal 61    Physical Exam Vitals reviewed.  Constitutional:      Appearance: She is well-developed.  Cardiovascular:     Rate and Rhythm: Normal rate and regular rhythm.     Pulses: Intact distal pulses.     Heart sounds: Murmur heard.  Midsystolic murmur is present with a grade of 2/6 radiating to the apex.    No gallop.     Comments: No JVD. Pulmonary:     Effort: Pulmonary effort is normal.     Breath sounds: Normal breath sounds.  Musculoskeletal:     Cervical back: Normal range of motion.     Right lower leg: No edema.     Left lower leg: No edema.  Neurological:     Mental Status: She is alert.   Laboratory examination:   Recent Labs    08/31/20 0911 03/08/21 1012 05/18/21 0916  NA 139 139 139  K 4.5 4.9 4.7  CL 104 103 102  CO2 25 26 23   GLUCOSE 91 93 99  BUN 18 13 12   CREATININE 0.88 0.83 0.69  CALCIUM 10.0 10.2 9.6  GFRNONAA >60 >60  --    estimated creatinine clearance is 85.9  mL/min (by C-G formula based on SCr of 0.69 mg/dL).  CMP Latest Ref Rng & Units 05/18/2021 03/08/2021 08/31/2020  Glucose 70 - 99 mg/dL 99 93 91  BUN 6 - 24 mg/dL 12 13 18   Creatinine 0.57 - 1.00 mg/dL 0.69 0.83 0.88  Sodium 134 - 144 mmol/L 139 139 139  Potassium 3.5 - 5.2 mmol/L 4.7 4.9 4.5  Chloride 96 - 106 mmol/L 102 103 104  CO2 20 - 29 mmol/L 23 26 25   Calcium 8.7 - 10.2 mg/dL 9.6 10.2 10.0  Total Protein 6.5 - 8.1 g/dL - 8.3(H) 8.5(H)  Total Bilirubin 0.3 - 1.2 mg/dL - 0.6 0.5  Alkaline Phos 38 - 126 U/L - 70 73  AST 15 - 41 U/L - 26 30  ALT 0 - 44 U/L - 39 39   CBC Latest Ref Rng & Units 03/08/2021 08/31/2020 01/15/2020  WBC 4.0 - 10.5 K/uL 4.6 3.3(L) 5.2  Hemoglobin 12.0 - 15.0 g/dL 13.0 12.7 13.1  Hematocrit 36.0 - 46.0 % 38.8 38.2 38.2  Platelets 150 - 400 K/uL 284 262 226   Lipid Panel Recent Labs    05/18/21 0916  CHOL 238*  TRIG 226*  LDLCALC 158*  HDL 38*     Cholesterol, total 205.000 m 04/05/2020 HDL 64.000 mg 04/05/2020 LDL 119.000 m 04/05/2020 Triglycerides 125.000 m 04/05/2020  HEMOGLOBIN A1C Lab Results  Component Value Date   HGBA1C 5.5 01/27/2013   TSH No results for input(s): TSH in the last 8760 hours. Allergies   Allergies  Allergen Reactions   Penicillins Shortness Of Breath and Rash    Has patient had a PCN reaction causing immediate rash, facial/tongue/throat swelling, SOB or lightheadedness with hypotension: no Has patient had a PCN reaction causing severe rash involving mucus membranes or skin necrosis: no Has patient had a PCN reaction that required hospitalization no Has patient had a PCN reaction occurring  within the last 10 years: no If all of the above answers are "NO", then may proceed with Cephalosporin use.  Other reaction(s): Unknown   Codeine Other (See Comments)    Manic depressive Other reaction(s): Unknown   Voltaren [Diclofenac] Nausea And Vomiting and Other (See Comments)    Pt stated "tore stomach up"   Crestor  [Rosuvastatin] Other (See Comments)    Myalgia   Diclofenac Sodium    Vytorin [Ezetimibe-Simvastatin] Other (See Comments)    Severe myalgia   Erythromycin Rash    Other reaction(s): Unknown    Medications Prior to Visit:   Outpatient Medications Prior to Visit  Medication Sig Dispense Refill   carvedilol (COREG) 6.25 MG tablet TAKE 1 TABLET BY MOUTH TWICE DAILY WITH A MEAL 120 tablet 0   EPINEPHrine 0.3 mg/0.3 mL IJ SOAJ injection Inject 0.3 mg into the muscle as needed for anaphylaxis. 1 each 0   FLUoxetine (PROZAC) 40 MG capsule Take 40 mg by mouth daily.     LORazepam (ATIVAN) 0.5 MG tablet Take 1-2 tablets (0.5-1 mg total) by mouth 3 (three) times daily as needed for anxiety. Prn anxiety 30 tablet 0   sacubitril-valsartan (ENTRESTO) 24-26 MG Take 1 tablet by mouth 2 (two) times daily.     tiZANidine (ZANAFLEX) 2 MG tablet Take 1 tablet (2 mg total) by mouth every 6 (six) hours as needed for muscle spasms. 30 tablet 5   sacubitril-valsartan (ENTRESTO) 24-26 mg per tablet      No facility-administered medications prior to visit.   Final Medications at End of Visit    Current Meds  Medication Sig   carvedilol (COREG) 6.25 MG tablet TAKE 1 TABLET BY MOUTH TWICE DAILY WITH A MEAL   EPINEPHrine 0.3 mg/0.3 mL IJ SOAJ injection Inject 0.3 mg into the muscle as needed for anaphylaxis.   FLUoxetine (PROZAC) 40 MG capsule Take 40 mg by mouth daily.   LORazepam (ATIVAN) 0.5 MG tablet Take 1-2 tablets (0.5-1 mg total) by mouth 3 (three) times daily as needed for anxiety. Prn anxiety   sacubitril-valsartan (ENTRESTO) 24-26 MG Take 1 tablet by mouth 2 (two) times daily.   tiZANidine (ZANAFLEX) 2 MG tablet Take 1 tablet (2 mg total) by mouth every 6 (six) hours as needed for muscle spasms.   Radiology:  No results found.  Cardiac Studies:   Lexiscan myoview stress test 04/07/2016: 1. Resting EKG demonstrates normal sinus rhythm with frequent PVCs in the pattern of ventricular bigeminy.   Low-voltage complexes.  PVCs decreased in the recovery phase of Lexiscan stress test. 2. Left ventricular cavity is noted to be mildly dilated with LV end diastolic volume of 703 mL on the rest and stress studies. Marland Kitchen SPECT images demonstrate homogeneous tracer distribution throughout the myocardium.  The left ventricular ejection fraction was calculated or visually estimated to be 33% with global hypokinesis. Findings are consistent with nonischemic cardiomyopathy.  Holter monitor 06/06/2017: Sinus rhythm.  Minimum heart rate 54 bpm, average heart rate 71 bpm, maximum heart rate 102 bpm.  Total ventricular ectopy 3.3%.  Less than 1% supraventricular ectopy  No atrial fibrillation, atrial flutter, ventricular tachycardia.  Echocardiogram 10/04/2020: Left ventricle cavity is normal in size and ventricular wall thickness. Abnormal septal wall motion due to left bundle branch block and moderate global hypokinesis. Cannot exclude apical thrombus. LVEF 35-40%. Grade 1 diastolic function, normal left atrial pressure.  Calculated EF 58%. Left atrial cavity is moderately dilated. Mild (grade 1) mitral regurgitation.  No evidence of pulmonary  hypertension. No significant change compared to previous study on 12/30/2019.    EKG   05/23/2021: Normal sinus rhythm at rate of 59 bpm, left bundle branch block.  No further analysis.  No significant change from prior EKG 11/01/2020. No significant change from 12/23/2019.    Assessment     ICD-10-CM   1. Nonischemic cardiomyopathy (HCC)  I42.8     2. Hypercholesteremia  E78.00     3. Essential hypertension, benign  I10     4. Exertional chest pain  R07.9 PCV MYOCARDIAL PERFUSION WO LEXISCAN    No orders of the defined types were placed in this encounter. No orders of the defined types were placed in this encounter.  Recommendations:   RIVKA BAUNE  is a 55 y.o. Caucasian female, with frequent PVCs and  new onset left bundle branch block and non ischemic  cardiomyopathy diagnosed in 2017 with severe LV systolic dysfunction, EF has improved with medical therapy to around 40 to 45%.  She has hypertension and hyperlipidemia.  She has also been diagnosed with IgG lambda MGUS, small fiber neuropathy, anxiety and depression, panic attacks and severe degenerative L5S1 spine disease and also fibromyalgia. She underwent L4-S1 fusion in January 2022.   Patient presents for 2-week follow-up.  At last office visit switch patient from lisinopril to [facility], repeat BMP remained stable.  Also ordered repeat lipid profile testing at last office visit Which noted lipids are uncontrolled with LDL at 158 and triglycerides 226.  Patient reports worsening fatigue and shortness of breath upon standing since starting Entresto.  She is notably not orthostatic in the office today.  However given concern this may be related to [employer] hold Entresto for 3 days.  If symptoms improve could consider switching patient back to lisinopril, however if symptoms are unchanged would recommend patient resume Entresto.  Given patient's new onset exertional chest pain chair decision was to proceed with nuclear stress test given multiple cardiovascular risk factors.  In regard to hyperlipidemia, patient is resistant to trying Livalo as she would prefer first to focus on making diet and lifestyle changes.  As well she has had intolerances to statin medications in the past and she has a vacation coming up to the [place].  She wishes not to rechallenge any statin therapy until after this trip.  Discussed Zetia as well, however again patient is resistant to new medications given upcoming vacation.  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  In the past she has been unable to tolerate Crestor, Lipitor, Vytorin due to myalgias.  Patient will call our office in 1 week to notify us if symptoms improved with holding Entresto.  We will schedule for  office visit in January 2023 when patient returns from her upcoming trip to the [place].   Alethia Berthold, PA-C 05/23/2021, 1:09 PM Office: [phone]

## 2021-05-23 ENCOUNTER — Other Ambulatory Visit: Payer: Self-pay

## 2021-05-23 ENCOUNTER — Encounter: Payer: Self-pay | Admitting: Student

## 2021-05-23 ENCOUNTER — Ambulatory Visit: Payer: Medicare PPO | Admitting: Student

## 2021-05-23 VITALS — BP 131/83 | HR 63 | Temp 97.8°F | Ht 66.0 in | Wt 181.6 lb

## 2021-05-23 DIAGNOSIS — R079 Chest pain, unspecified: Secondary | ICD-10-CM

## 2021-05-23 DIAGNOSIS — I428 Other cardiomyopathies: Secondary | ICD-10-CM | POA: Diagnosis not present

## 2021-05-23 DIAGNOSIS — E7800 Pure hypercholesterolemia, unspecified: Secondary | ICD-10-CM

## 2021-05-23 DIAGNOSIS — I10 Essential (primary) hypertension: Secondary | ICD-10-CM | POA: Diagnosis not present

## 2021-05-23 NOTE — Patient Instructions (Signed)
Do not take Entresto for 3 days. If shortness of breath, fatigue or chest pressure improved then continue not to take it. If these symptoms do not improved, resume Entresto.  Call the office in 1 week to notify if symptoms improved or not.  Will hold off for now regarding cholesterol medication changes.

## 2021-05-30 ENCOUNTER — Other Ambulatory Visit: Payer: Medicare PPO

## 2021-06-02 DIAGNOSIS — U071 COVID-19: Secondary | ICD-10-CM

## 2021-06-02 HISTORY — DX: COVID-19: U07.1

## 2021-06-06 ENCOUNTER — Encounter: Payer: Self-pay | Admitting: Family Medicine

## 2021-06-06 ENCOUNTER — Other Ambulatory Visit: Payer: Self-pay | Admitting: Family Medicine

## 2021-06-06 DIAGNOSIS — R0981 Nasal congestion: Secondary | ICD-10-CM | POA: Diagnosis not present

## 2021-06-06 DIAGNOSIS — R051 Acute cough: Secondary | ICD-10-CM | POA: Diagnosis not present

## 2021-06-06 DIAGNOSIS — U071 COVID-19: Secondary | ICD-10-CM | POA: Diagnosis not present

## 2021-06-06 DIAGNOSIS — Z1231 Encounter for screening mammogram for malignant neoplasm of breast: Secondary | ICD-10-CM

## 2021-06-06 DIAGNOSIS — R509 Fever, unspecified: Secondary | ICD-10-CM | POA: Diagnosis not present

## 2021-06-07 ENCOUNTER — Other Ambulatory Visit: Payer: Medicare PPO

## 2021-06-08 ENCOUNTER — Encounter: Payer: Self-pay | Admitting: Cardiology

## 2021-06-08 NOTE — Telephone Encounter (Signed)
From patient.

## 2021-06-10 MED ORDER — LISINOPRIL 10 MG PO TABS: 10.0000 mg | ORAL_TABLET | Freq: Every day | ORAL | 3 refills | Status: DC

## 2021-06-15 ENCOUNTER — Other Ambulatory Visit: Payer: Medicare PPO

## 2021-07-14 ENCOUNTER — Ambulatory Visit: Payer: Medicare PPO | Admitting: Neurology

## 2021-07-19 ENCOUNTER — Ambulatory Visit
Admission: RE | Admit: 2021-07-19 | Discharge: 2021-07-19 | Disposition: A | Discharge status: Home / Self Care | Payer: Medicare PPO | Source: Ambulatory Visit

## 2021-07-19 ENCOUNTER — Other Ambulatory Visit: Payer: Self-pay | Admitting: Cardiology

## 2021-07-19 DIAGNOSIS — Z23 Encounter for immunization: Secondary | ICD-10-CM | POA: Diagnosis not present

## 2021-07-19 DIAGNOSIS — Z808 Family history of malignant neoplasm of other organs or systems: Secondary | ICD-10-CM | POA: Diagnosis not present

## 2021-07-19 DIAGNOSIS — D225 Melanocytic nevi of trunk: Secondary | ICD-10-CM | POA: Diagnosis not present

## 2021-07-19 DIAGNOSIS — I10 Essential (primary) hypertension: Secondary | ICD-10-CM

## 2021-07-19 DIAGNOSIS — L578 Other skin changes due to chronic exposure to nonionizing radiation: Secondary | ICD-10-CM | POA: Diagnosis not present

## 2021-07-19 DIAGNOSIS — D2271 Melanocytic nevi of right lower limb, including hip: Secondary | ICD-10-CM | POA: Diagnosis not present

## 2021-07-19 DIAGNOSIS — Z86006 Personal history of melanoma in-situ: Secondary | ICD-10-CM | POA: Diagnosis not present

## 2021-07-19 DIAGNOSIS — Z1231 Encounter for screening mammogram for malignant neoplasm of breast: Secondary | ICD-10-CM | POA: Diagnosis not present

## 2021-07-19 DIAGNOSIS — D224 Melanocytic nevi of scalp and neck: Secondary | ICD-10-CM | POA: Diagnosis not present

## 2021-07-19 DIAGNOSIS — Z86018 Personal history of other benign neoplasm: Secondary | ICD-10-CM | POA: Diagnosis not present

## 2021-07-22 NOTE — Progress Notes (Deleted)
Primary Physician/Referring:  Rita Ohara, MD  Patient ID: Lindsey Rodriguez, female    DOB: [birth year], 56 y.o.   MRN: [id number]  No chief complaint on file.   HPI:    Lindsey Rodriguez  is a 56 y.o.  Caucasian female, with frequent PVCs and  new onset left bundle branch block and non ischemic cardiomyopathy diagnosed in 2017 with severe LV systolic dysfunction, EF has improved with medical therapy to around 40 to 45%.  She has hypertension and hyperlipidemia.  She has also been diagnosed with IgG lambda MGUS, small fiber neuropathy, anxiety and depression, panic attacks and severe degenerative L5S1 spine disease and also fibromyalgia. She underwent L4-S1 fusion in January 2022.  Notably patient has been unable to tolerate higher doses of lisinopril or carvedilol in the past.  Patient has been unable to tolerate Entresto due to fatigue and shortness of breath, she therefore switched back to lisinopril which she previously tolerated without issue.  At last office visit recommended very challenging statin therapy and initiating Zetia, however patient was resistant to this and preferred to make diet and lifestyle modifications first.  Patient now presents for 32-month follow-up.***  ***  Patient presents for 2-week follow-up.  At last office visit switch patient from lisinopril to [facility], repeat BMP remained stable.  Also ordered repeat lipid profile testing at last office visit Which noted lipids are uncontrolled with LDL at 158 and triglycerides 226. Patient reports over the last 2 weeks since starting Entresto she has developed exertional chest discomfort which she describes as pressure occurring at least twice daily and relieved by rest. She also reports episodes of shortness of breath upon standing and generalized fatigue.   She is not having active chest discomfort. Denies dizziness, syncope, near-syncope. She notably has no formal exercise routine. 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   Patient has not been able to tolerate higher doses of lisinopril or carvedilol.  However she does state that she gives up easily and it is extremely difficult to make out her symptoms as she has fibromyalgias, back pain, and her fatigue could be deconditioning as well.   She has not been able to tolerate Crestor, Vytorin and also Lipitor due to severe muscle aches and cramps.     Past Medical History:  Diagnosis Date   Anemia    Anxiety panic attacks   [name]   Arthritis    knees   Cardiomyopathy, nonischemic ([place]) 04/07/2016   EF left ventricule calculated on Lexiscan stress test at 33% with global hypokinesis.    Chronic back pain    Chronic neck pain    COVID-19 06/2021   treated with Paxlovid through [facility] in [place]    pvc's with cardiomyopathy   Family history of adverse reaction to anesthesia    mother has problems waking up   Fibromyalgia    and neuropathy of feet   History of kidney stones    lithotripsy  03/2016   Hyperlipidemia    Hypertension    Kidney stone    History   Kidney stones h/o   Melanoma (HCC) RUE   [name]   MGUS (monoclonal gammopathy of unknown significance) 06/2015   no treatment just watching recheck blood work 12/2015   Migraine    OTC med prn - stress related   Plantar fasciitis right  Renal stones 2008/2011/2017   Seasonal allergies    SVD (spontaneous vaginal delivery)    x 3   Tendonitis of foot right   Past Surgical History:  Procedure Laterality Date   ABLATION ON ENDOMETRIOSIS N/A 11/24/2015   Procedure: ABLATION ON ENDOMETRIOSIS;  Surgeon: [name], MD;  Location: [facility];  Service: Gynecology;  Laterality: N/A;   ANTERIOR LAT LUMBAR FUSION Right 06/15/2016   Procedure: RIGHT LUMBAR THREE-FOUR ANTEROLATERAL LUMBAR INTERBODY FUSION WITH LATERAL PLATE;  Surgeon: Erline Levine, MD;  Location:  [facility];  Service: Neurosurgery;  Laterality: Right;   BACK SURGERY  Dr.Stern, last sx Summer 2011   X 4  (L3-S1)   BACK SURGERY  06/2016   BACK SURGERY  07/20/2020   L2-3 lateral interbody (Left side approach), L2-3 Posterior Spinal Fusion at [facility] N/A 11/24/2015   Procedure: TRANSVAGINAL TAPE (TVT) PROCEDURE;  Surgeon: [name], MD;  Location: [facility];  Service: Gynecology;  Laterality: N/A;   C2-3 nerve ablation Right 06/2019   Dr. Maia Petties (per pt)   COLONOSCOPY     CYSTOCELE REPAIR  11/24/2015   Procedure: ANTERIOR REPAIR (CYSTOCELE);  Surgeon: [name], MD;  Location: [facility];  Service: Gynecology;;   CYSTOSCOPY N/A 11/24/2015   Procedure: Consuela Mimes;  Surgeon: [name], MD;  Location: [facility];  Service: Gynecology;  Laterality: N/A;   DILATION AND CURETTAGE OF UTERUS     ENDOMETRIAL ABLATION  08/2000   EYE SURGERY Bilateral    Lasik    KIDNEY STONE SURGERY  2011   LITHOTRIPSY  08/2017 (L), 03/2018 (R)   MELANOMA EXCISION  05/2015   right forearm; [name]   NECK SURGERY  [name]  Summer 2011   X 4  (C4-T1 Level)   RHINOPLASTY     ROBOTIC ASSISTED BILATERAL SALPINGO OOPHERECTOMY Bilateral 11/24/2015   Procedure: ROBOTIC ASSISTED BILATERAL SALPINGO OOPHORECTOMY;  Surgeon: [name], MD;  Location: [facility];  Service: Gynecology;  Laterality: Bilateral;--OVARIES NOT REMOVED PER PATH REPORT   ROBOTIC ASSISTED TOTAL HYSTERECTOMY WITH SALPINGECTOMY Bilateral 11/24/2015   Procedure: ROBOTIC ASSISTED TOTAL HYSTERECTOMY WITH SALPINGECTOMY;  Surgeon: [name], MD;  Location: [facility];  Service: Gynecology;  Laterality: Bilateral;   TONSILLECTOMY     TOTAL VAGINAL HYSTERECTOMY     WISDOM TOOTH EXTRACTION     Family History  Problem Relation Age of Onset   Hypertension Mother    Depression Mother    Migraines Mother    Hypothyroidism Mother    Hypercholesterolemia Mother    COPD Father    Heart failure Father 96   Healthy Brother     Cardiomyopathy Daughter 29       related to COVID (illness or vaccine?)   Healthy Daughter    Healthy Daughter    Healthy Son    Diabetes Neg Hx    Social History   Tobacco Use   Smoking status: Former    Packs/day: 0.10    Years: 3.00    Pack years: 0.30    Types: Cigarettes    Quit date: 07/04/1987    Years since quitting: 34.0   Smokeless tobacco: Never  Substance Use Topics   Alcohol use: Yes    Comment: 2/week   Marital Status: Married   ROS  Review of Systems  Constitutional: Positive for malaise/fatigue. Negative for weight gain.  Cardiovascular:  Positive for chest pain. Negative for claudication, leg swelling, near-syncope, orthopnea, palpitations, paroxysmal nocturnal dyspnea and syncope.  Respiratory:  Positive for  shortness of breath.   Neurological:  Negative for dizziness.  Psychiatric/Behavioral:  Positive for depression. The patient is nervous/anxious.   Objective   Vitals with BMI 05/23/2021 05/04/2021 04/14/2021  Height 5\' 6"  5\' 6"  5\' 6"   Weight 181 lbs 10 oz 180 lbs 177 lbs 3 oz  BMI 29.32 54.09 81.19  Systolic 147 829 562  Diastolic 83 81 80  Pulse 63 - 72    No data found.   Physical Exam Vitals reviewed.  Constitutional:      Appearance: She is well-developed.  Cardiovascular:     Rate and Rhythm: Normal rate and regular rhythm.     Pulses: Intact distal pulses.     Heart sounds: Murmur heard.  Midsystolic murmur is present with a grade of 2/6 radiating to the apex.    No gallop.     Comments: No JVD. Pulmonary:     Effort: Pulmonary effort is normal.     Breath sounds: Normal breath sounds.  Musculoskeletal:     Cervical back: Normal range of motion.     Right lower leg: No edema.     Left lower leg: No edema.  Neurological:     Mental Status: She is alert.   Laboratory examination:   Recent Labs    08/31/20 0911 03/08/21 1012 05/18/21 0916  NA 139 139 139  K 4.5 4.9 4.7  CL 104 103 102  CO2 25 26 23   GLUCOSE 91 93 99   BUN 18 13 12   CREATININE 0.88 0.83 0.69  CALCIUM 10.0 10.2 9.6  GFRNONAA >60 >60  --     CrCl cannot be calculated (Patient's most recent lab result is older than the maximum 21 days allowed.).  CMP Latest Ref Rng & Units 05/18/2021 03/08/2021 08/31/2020  Glucose 70 - 99 mg/dL 99 93 91  BUN 6 - 24 mg/dL 12 13 18   Creatinine 0.57 - 1.00 mg/dL 0.69 0.83 0.88  Sodium 134 - 144 mmol/L 139 139 139  Potassium 3.5 - 5.2 mmol/L 4.7 4.9 4.5  Chloride 96 - 106 mmol/L 102 103 104  CO2 20 - 29 mmol/L 23 26 25   Calcium 8.7 - 10.2 mg/dL 9.6 10.2 10.0  Total Protein 6.5 - 8.1 g/dL - 8.3(H) 8.5(H)  Total Bilirubin 0.3 - 1.2 mg/dL - 0.6 0.5  Alkaline Phos 38 - 126 U/L - 70 73  AST 15 - 41 U/L - 26 30  ALT 0 - 44 U/L - 39 39   CBC Latest Ref Rng & Units 03/08/2021 08/31/2020 01/15/2020  WBC 4.0 - 10.5 K/uL 4.6 3.3(L) 5.2  Hemoglobin 12.0 - 15.0 g/dL 13.0 12.7 13.1  Hematocrit 36.0 - 46.0 % 38.8 38.2 38.2  Platelets 150 - 400 K/uL 284 262 226   Lipid Panel Recent Labs    05/18/21 0916  CHOL 238*  TRIG 226*  LDLCALC 158*  HDL 38*      Cholesterol, total 205.000 m 04/05/2020 HDL 64.000 mg 04/05/2020 LDL 119.000 m 04/05/2020 Triglycerides 125.000 m 04/05/2020  HEMOGLOBIN A1C Lab Results  Component Value Date   HGBA1C 5.5 01/27/2013   TSH No results for input(s): TSH in the last 8760 hours.  Allergies   Allergies  Allergen Reactions   Penicillins Shortness Of Breath and Rash    Has patient had a PCN reaction causing immediate rash, facial/tongue/throat swelling, SOB or lightheadedness with hypotension: no Has patient had a PCN reaction causing severe rash involving mucus membranes or skin necrosis: no Has patient had a  PCN reaction that required hospitalization no Has patient had a PCN reaction occurring within the last 10 years: no If all of the above answers are "NO", then may proceed with Cephalosporin use.  Other reaction(s): Unknown   Codeine Other (See Comments)    Manic  depressive Other reaction(s): Unknown   Voltaren [Diclofenac] Nausea And Vomiting and Other (See Comments)    Pt stated "tore stomach up"   Crestor [Rosuvastatin] Other (See Comments)    Myalgia   Diclofenac Sodium    Vytorin [Ezetimibe-Simvastatin] Other (See Comments)    Severe myalgia   Erythromycin Rash    Other reaction(s): Unknown    Medications Prior to Visit:   Outpatient Medications Prior to Visit  Medication Sig Dispense Refill   carvedilol (COREG) 6.25 MG tablet TAKE 1 TABLET BY MOUTH TWICE DAILY WITH A MEAL 120 tablet 0   EPINEPHrine 0.3 mg/0.3 mL IJ SOAJ injection Inject 0.3 mg into the muscle as needed for anaphylaxis. 1 each 0   FLUoxetine (PROZAC) 40 MG capsule Take 40 mg by mouth daily.     lisinopril (ZESTRIL) 10 MG tablet Take 1 tablet (10 mg total) by mouth daily. 90 tablet 3   LORazepam (ATIVAN) 0.5 MG tablet Take 1-2 tablets (0.5-1 mg total) by mouth 3 (three) times daily as needed for anxiety. Prn anxiety 30 tablet 0   tiZANidine (ZANAFLEX) 2 MG tablet Take 1 tablet (2 mg total) by mouth every 6 (six) hours as needed for muscle spasms. 30 tablet 5   No facility-administered medications prior to visit.   Final Medications at End of Visit    No outpatient medications have been marked as taking for the 07/25/21 encounter (Appointment) with Lindsey Rodriguez, Lindsey Ozawa C, PA-Rodriguez.   Radiology:  No results found.  Cardiac Studies:   Lexiscan myoview stress test 04/07/2016: 1. Resting EKG demonstrates normal sinus rhythm with frequent PVCs in the pattern of ventricular bigeminy.  Low-voltage complexes.  PVCs decreased in the recovery phase of Lexiscan stress test. 2. Left ventricular cavity is noted to be mildly dilated with LV end diastolic volume of 366 mL on the rest and stress studies. Marland Kitchen SPECT images demonstrate homogeneous tracer distribution throughout the myocardium.  The left ventricular ejection fraction was calculated or visually estimated to be 33% with global  hypokinesis. Findings are consistent with nonischemic cardiomyopathy.  Holter monitor 06/06/2017: Sinus rhythm.  Minimum heart rate 54 bpm, average heart rate 71 bpm, maximum heart rate 102 bpm.  Total ventricular ectopy 3.3%.  Less than 1% supraventricular ectopy  No atrial fibrillation, atrial flutter, ventricular tachycardia.  Echocardiogram 10/04/2020: Left ventricle cavity is normal in size and ventricular wall thickness. Abnormal septal wall motion due to left bundle branch block and moderate global hypokinesis. Cannot exclude apical thrombus. LVEF 35-40%. Grade 1 diastolic function, normal left atrial pressure.  Calculated EF 58%. Left atrial cavity is moderately dilated. Mild (grade 1) mitral regurgitation.  No evidence of pulmonary hypertension. No significant change compared to previous study on 12/30/2019.    EKG   05/23/2021: Normal sinus rhythm at rate of 59 bpm, left bundle branch block.  No further analysis.  No significant change from prior EKG 11/01/2020. No significant change from 12/23/2019.    Assessment   No diagnosis found. No orders of the defined types were placed in this encounter. No orders of the defined types were placed in this encounter.  Recommendations:   CLORINE SWING  is a 56 y.o. Caucasian female, with frequent PVCs and  new onset left bundle branch block and non ischemic cardiomyopathy diagnosed in 2017 with severe LV systolic dysfunction, EF has improved with medical therapy to around 40 to 45%.  She has hypertension and hyperlipidemia.  She has also been diagnosed with IgG lambda MGUS, small fiber neuropathy, anxiety and depression, panic attacks and severe degenerative L5S1 spine disease and also fibromyalgia. She underwent L4-S1 fusion in January 2022.   Patient has been unable to tolerate Entresto due to fatigue and shortness of breath, she therefore switched back to lisinopril which she previously tolerated without issue.  At last office visit  recommended very challenging statin therapy and initiating Zetia, however patient was resistant to this and preferred to make diet and lifestyle modifications first.  Patient now presents for 12-month follow-up.***  ***  Patient presents for 2-week follow-up.  At last office visit switch patient from lisinopril to [facility], repeat BMP remained stable.  Also ordered repeat lipid profile testing at last office visit Which noted lipids are uncontrolled with LDL at 158 and triglycerides 226.  Patient reports worsening fatigue and shortness of breath upon standing since starting Entresto.  She is notably not orthostatic in the office today.  However given concern this may be related to [employer] hold Entresto for 3 days.  If symptoms improve could consider switching patient back to lisinopril, however if symptoms are unchanged would recommend patient resume Entresto.  Given patient's new onset exertional chest pain chair decision was to proceed with nuclear stress test given multiple cardiovascular risk factors.  In regard to hyperlipidemia, patient is resistant to trying Livalo as she would prefer first to focus on making diet and lifestyle changes.  As well she has had intolerances to statin medications in the past and she has a vacation coming up to the [place].  She wishes not to rechallenge any statin therapy until after this trip.  Discussed Zetia as well, however again patient is resistant to new medications given upcoming vacation.  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  In the past she has been unable to tolerate Crestor, Lipitor, Vytorin due to myalgias.  Patient will call our office in 1 week to notify us if symptoms improved with holding Entresto.  We will schedule for office visit in January 2023 when patient returns from her upcoming trip to the [place].   Lindsey Berthold, PA-Rodriguez 07/22/2021, 12:49 PM Office: [phone]

## 2021-07-24 ENCOUNTER — Encounter: Payer: Self-pay | Admitting: Cardiology

## 2021-07-25 ENCOUNTER — Ambulatory Visit: Payer: Medicare PPO | Admitting: Student

## 2021-07-25 DIAGNOSIS — E7800 Pure hypercholesterolemia, unspecified: Secondary | ICD-10-CM

## 2021-07-25 DIAGNOSIS — I428 Other cardiomyopathies: Secondary | ICD-10-CM

## 2021-07-27 ENCOUNTER — Encounter: Payer: Self-pay | Admitting: Family Medicine

## 2021-07-27 ENCOUNTER — Telehealth: Payer: Medicare PPO | Admitting: Family Medicine

## 2021-07-27 ENCOUNTER — Other Ambulatory Visit: Payer: Self-pay

## 2021-07-27 VITALS — BP 152/101 | HR 67 | Temp 100.2°F | Ht 66.0 in | Wt 180.0 lb

## 2021-07-27 DIAGNOSIS — R051 Acute cough: Secondary | ICD-10-CM | POA: Diagnosis not present

## 2021-07-27 DIAGNOSIS — R058 Other specified cough: Secondary | ICD-10-CM | POA: Diagnosis not present

## 2021-07-27 DIAGNOSIS — R509 Fever, unspecified: Secondary | ICD-10-CM

## 2021-07-27 DIAGNOSIS — J3489 Other specified disorders of nose and nasal sinuses: Secondary | ICD-10-CM

## 2021-07-27 MED ORDER — BENZONATATE 200 MG PO CAPS: 200.0000 mg | ORAL_CAPSULE | Freq: Three times a day (TID) | ORAL | 0 refills | Status: DC | PRN

## 2021-07-27 MED ORDER — AZITHROMYCIN 250 MG PO TABS: ORAL_TABLET | ORAL | 0 refills | Status: DC

## 2021-07-27 NOTE — Patient Instructions (Signed)
Stay well hydated Stop the plain Mucinex 12 hour. Switch back to the 12 hour Mucinex-DM and take it twice daily. Stop both Coricidin products that you have at home (you're overlapping the guaifenesin with the Mucinex and the daytime).  Don't take the tylenol cold product or any decongestants, since your blood pressure is high. Take Tessalon in addition, if needed for cough. Continue Neti-pot once or twice daily.  Use boiled or distilled water, not tap water.  If your COVID test is negative, then start the z-pak to cover for a sinus infection. We discussed the possibilities of this being a viral illness (flu, COVID, other viruses), but will treat for the possibility of a secondary infection (bacterial sinus infection).  I hope you feel better soon!

## 2021-07-27 NOTE — Progress Notes (Signed)
Start time: 11:44  UNABLE TO GET HER TO CONNECT TO VIDEO OR AUDIO, DONE OVER PHONE End time: 12:13  Virtual Visit via telephone Note  I connected with Lindsey Rodriguez on 07/27/21 by telephone and verified that I am speaking with the correct person using two identifiers.  Location: Patient: home Provider: office   I discussed the limitations of evaluation and management by telemedicine and the availability of in person appointments. The patient expressed understanding and agreed to proceed.  History of Present Illness:  Chief Complaint  Patient presents with   Cough    VIRTUAL cough, chest congestion and sinus drainage. Chest hurts from coughing so much. Symptoms started last Friday. No home covid tests.    She started with mild sinus congestion on 1/16.  Mucus was light green.  She started coughing a lot more on 1/20, and felt very achey. She started with low grade fever that night as well. She has been coughing up "globs of yellow-green, brown-green". "I feel lousy". She has sinus pain across her forehead, cheeks, pain in both ears, R>L. Nasal drainage is sometimes clear, sometime discolored.  Chest feels tight, ribs are sore from coughing. Not sleeping well due to cough, evening with a "nighttime med".  OTC meds tried for this illness: Tylenol Allergy/sinus off/on last week. Then switched to Mucinex DM.  Then switched to a 12 hour plain mucinex on 1/23. She started Coricidin HBP nighttime last night (containing acetaminophen DM and doxylamine), took daytime today at 10 (containing acetaminophen and guaifenesin). She was able to sleep 1-2 hours with the nighttime med, still waking up coughing. Phlegm is now a darker green, sometimes brown. She has been doing sinus rinses, helps temporarily, for about an hour.  Mucus is light yellow after Neti-pot.  No sick contacts.  PMH, PSH, SH reviewed  Outpatient Encounter Medications as of 07/27/2021  Medication Sig Note   carvedilol  (COREG) 6.25 MG tablet TAKE 1 TABLET BY MOUTH TWICE DAILY WITH A MEAL    dextromethorphan-guaiFENesin (MUCINEX DM) 30-600 MG 12hr tablet Take 1 tablet by mouth 2 (two) times daily. 07/27/2021: stopped   DM-APAP-CPM (CORICIDIN HBP PO) Take 2 capsules by mouth every 4 (four) hours. 07/27/2021: Night-time formula-took last night   DM-APAP-CPM (CORICIDIN HBP PO) Take 2 capsules by mouth every 4 (four) hours. 07/27/2021: Day-time formula-took today at 10am   FLUoxetine (PROZAC) 40 MG capsule Take 40 mg by mouth daily.    guaiFENesin (MUCINEX PO) Take 1 tablet by mouth as needed. 07/27/2021: 1200mg  ER-last dose yesterday am   lisinopril (ZESTRIL) 10 MG tablet Take 1 tablet (10 mg total) by mouth daily.    Pseudoeph-CPM-DM-APAP (TYLENOL COLD PO) Take 2 capsules by mouth every 4 (four) hours. 07/27/2021: Took yesterday   EPINEPHrine 0.3 mg/0.3 mL IJ SOAJ injection Inject 0.3 mg into the muscle as needed for anaphylaxis. (Patient not taking: Reported on 07/27/2021)    LORazepam (ATIVAN) 0.5 MG tablet Take 1-2 tablets (0.5-1 mg total) by mouth 3 (three) times daily as needed for anxiety. Prn anxiety (Patient not taking: Reported on 07/27/2021) 02/03/2021: As needed    tiZANidine (ZANAFLEX) 2 MG tablet Take 1 tablet (2 mg total) by mouth every 6 (six) hours as needed for muscle spasms. (Patient not taking: Reported on 07/27/2021) 04/14/2021: Uses prn for headaches, 2x/month   No facility-administered encounter medications on file as of 07/27/2021.     Allergies  Allergen Reactions   Penicillins Shortness Of Breath and Rash    Has patient  had a PCN reaction causing immediate rash, facial/tongue/throat swelling, SOB or lightheadedness with hypotension: no Has patient had a PCN reaction causing severe rash involving mucus membranes or skin necrosis: no Has patient had a PCN reaction that required hospitalization no Has patient had a PCN reaction occurring within the last 10 years: no If all of the above answers are  "NO", then may proceed with Cephalosporin use.  Other reaction(s): Unknown   Codeine Other (See Comments)    Manic depressive Other reaction(s): Unknown   Voltaren [Diclofenac] Nausea And Vomiting and Other (See Comments)    Pt stated "tore stomach up"   Crestor [Rosuvastatin] Other (See Comments)    Myalgia   Diclofenac Sodium    Vytorin [Ezetimibe-Simvastatin] Other (See Comments)    Severe myalgia   Erythromycin Rash    Other reaction(s): Unknown    ROS: +f/c, sinus pain, headache, cough, chest congestion per HPI.  No n/v/d, rash or other complaints.  See HPI  Observations/Objective:  BP (!) 152/101    Pulse 67    Temp 100.2 F (37.9 C) (Temporal)    Ht 5\' 6"  (1.676 m)    Wt 180 lb (81.6 kg)    LMP 12/16/2012    BMI 29.05 kg/m   Patient sounds a little congested. She has laryngitis--voice intermittently high and squeaky, hard to hear/understand.  She repeats herself in a lower sound and I can understand her, but she states that hurts more. She sounds alert, oriented, and in no distress. While she is hard to hear (due to partial loss of voice), she is speaking comfortably. Exam is limited due to virtual nature of the visit  Assessment and Plan:  Sinus pain - cont neti-pot guaifenesin.  Poss early sinusitis. Avoid decongestants due to elevated BP  Acute cough - change to Mucinex DM, and tessalon prn.  Has been geting duplicate guaifenesin from some of her OTC meds - Plan: benzonatate (TESSALON) 200 MG capsule  Cough productive of purulent sputum - discussed viral vs bacterial, will cover for sinus infection if COVID test negative - Plan: azithromycin (ZITHROMAX) 250 MG tablet  Fever, unspecified fever cause - ddx reviewed--overall sounds like a viral syndrome, but worsening at 8-9d, rather than improving. Ddx includes flu, COVID, other virusis, sinusitis  OTC meds reviewed in detail--to avoid decongestants, and has been doubling up on guaifenesin from 2 separate OTC  meds.  Pt to do home COVID test.  If negative, can start ABX. She preferred not to come to office for flu/COVID testing. Will get home test for COVID.  Too late to treat with antivirals for flu, so no need to test at this point.   Follow Up Instructions:    I discussed the assessment and treatment plan with the patient. The patient was provided an opportunity to ask questions and all were answered. The patient agreed with the plan and demonstrated an understanding of the instructions.   The patient was advised to call back or seek an in-person evaluation if the symptoms worsen or if the condition fails to improve as anticipated.  I spent 32 minutes dedicated to the care of this patient, including pre-visit review of records, face to face time, post-visit ordering of testing and documentation.    [name], MD

## 2021-07-29 NOTE — Progress Notes (Signed)
Primary Physician/Referring:  Rita Ohara, MD  Patient ID: Lindsey Rodriguez, female    DOB: 1966/03/10, 56 y.o.   MRN: [id number]  Chief Complaint  Patient presents with   Nonischemic cardiomyopathy    Follow-up    HPI:    Lindsey Rodriguez  is a 56 y.o.  Caucasian female, with frequent PVCs and  new onset left bundle branch block and non ischemic cardiomyopathy diagnosed in 2017 with severe LV systolic dysfunction, EF has improved with medical therapy to around 40 to 45%.  She has hypertension and hyperlipidemia.  She has also been diagnosed with IgG lambda MGUS, small fiber neuropathy, anxiety and depression, panic attacks and severe degenerative L5S1 spine disease and also fibromyalgia. She underwent L4-S1 fusion in January 2022.  Notably patient has been unable to tolerate higher doses of lisinopril or carvedilol in the past.  Patient has been unable to tolerate Entresto due to fatigue and shortness of breath, she therefore switched back to lisinopril which she previously tolerated without issue.  At last office visit recommended re-challenging statin therapy and initiating Zetia, however patient was resistant to this and preferred to make diet and lifestyle modifications first.  Patient now presents for 57-month follow-up.  Patient had a wonderful trip to the [place], however she does states she did a lot of walking and continue to have intermittent episodes of exertional chest discomfort and shortness of breath.  Notably shortness of breath has been worse over the last 1 week as she is currently being treated for bronchitis by PCP.  She has also been experiencing fatigue for the last 1 week.   Patient has now finished EMT classes and states that her stress level has improved since last office visit.  She is open to be challenging statin therapy or Zetia if lipids remain uncontrolled on repeat testing.  Patient has not been able to tolerate higher doses of lisinopril or carvedilol.  However  she does state that she gives up easily and it is extremely difficult to make out her symptoms as she has fibromyalgias, back pain, and her fatigue could be deconditioning as well.   She has not been able to tolerate Crestor, Vytorin and also Lipitor due to severe muscle aches and cramps.     Past Medical History:  Diagnosis Date   Anemia    Anxiety panic attacks   [name]   Arthritis    knees   Cardiomyopathy, nonischemic ([place]) 04/07/2016   EF left ventricule calculated on Lexiscan stress test at 33% with global hypokinesis.    Chronic back pain    Chronic neck pain    COVID-19 06/2021   treated with Paxlovid through [facility] in [place]    pvc's with cardiomyopathy   Family history of adverse reaction to anesthesia    mother has problems waking up   Fibromyalgia    and neuropathy of feet   History of kidney stones    lithotripsy  03/2016   Hyperlipidemia    Hypertension    Kidney stone    History   Kidney stones h/o   Melanoma ([place]) RUE   [name]   MGUS (monoclonal gammopathy of unknown significance) 06/2015   no treatment just watching recheck blood work 12/2015   Migraine    OTC med prn - stress related   Plantar fasciitis right   Renal stones 2008/2011/2017   Seasonal allergies    SVD (spontaneous vaginal delivery)  x 3   Tendonitis of foot right   Past Surgical History:  Procedure Laterality Date   ABLATION ON ENDOMETRIOSIS N/A 11/24/2015   Procedure: ABLATION ON ENDOMETRIOSIS;  Surgeon: [name], MD;  Location: [facility];  Service: Gynecology;  Laterality: N/A;   ANTERIOR LAT LUMBAR FUSION Right 06/15/2016   Procedure: RIGHT LUMBAR THREE-FOUR ANTEROLATERAL LUMBAR INTERBODY FUSION WITH LATERAL PLATE;  Surgeon: Erline Levine, MD;  Location: [facility];  Service: Neurosurgery;  Laterality: Right;   BACK SURGERY  Dr.Stern, last sx Summer 2011   X 4  (L3-S1)   BACK SURGERY  06/2016   BACK SURGERY  07/20/2020   L2-3 lateral  interbody (Left side approach), L2-3 Posterior Spinal Fusion at [place] N/A 11/24/2015   Procedure: TRANSVAGINAL TAPE (TVT) PROCEDURE;  Surgeon: [name], MD;  Location: [facility];  Service: Gynecology;  Laterality: N/A;   C2-3 nerve ablation Right 06/2019   Dr. Maia Petties (per pt)   COLONOSCOPY     CYSTOCELE REPAIR  11/24/2015   Procedure: ANTERIOR REPAIR (CYSTOCELE);  Surgeon: [name], MD;  Location: [facility];  Service: Gynecology;;   CYSTOSCOPY N/A 11/24/2015   Procedure: Consuela Mimes;  Surgeon: [name], MD;  Location: [facility];  Service: Gynecology;  Laterality: N/A;   DILATION AND CURETTAGE OF UTERUS     ENDOMETRIAL ABLATION  08/2000   EYE SURGERY Bilateral    Lasik    KIDNEY STONE SURGERY  2011   LITHOTRIPSY  08/2017 (L), 03/2018 (R)   MELANOMA EXCISION  05/2015   right forearm; [name]   NECK SURGERY  [name]  Summer 2011   X 4  (C4-T1 Level)   RHINOPLASTY     ROBOTIC ASSISTED BILATERAL SALPINGO OOPHERECTOMY Bilateral 11/24/2015   Procedure: ROBOTIC ASSISTED BILATERAL SALPINGO OOPHORECTOMY;  Surgeon: [name], MD;  Location: [facility];  Service: Gynecology;  Laterality: Bilateral;--OVARIES NOT REMOVED PER PATH REPORT   ROBOTIC ASSISTED TOTAL HYSTERECTOMY WITH SALPINGECTOMY Bilateral 11/24/2015   Procedure: ROBOTIC ASSISTED TOTAL HYSTERECTOMY WITH SALPINGECTOMY;  Surgeon: [name], MD;  Location: [facility];  Service: Gynecology;  Laterality: Bilateral;   TONSILLECTOMY     TOTAL VAGINAL HYSTERECTOMY     WISDOM TOOTH EXTRACTION     Family History  Problem Relation Age of Onset   Hypertension Mother    Depression Mother    Migraines Mother    Hypothyroidism Mother    Hypercholesterolemia Mother    COPD Father    Heart failure Father 63   Healthy Brother    Cardiomyopathy Daughter 28       related to COVID (illness or vaccine?)   Healthy Daughter    Healthy Daughter    Healthy Son    Diabetes Neg Hx    Social History    Tobacco Use   Smoking status: Former    Packs/day: 0.10    Years: 3.00    Pack years: 0.30    Types: Cigarettes    Quit date: 07/04/1987    Years since quitting: 34.1   Smokeless tobacco: Never  Substance Use Topics   Alcohol use: Yes    Comment: 2/week   Marital Status: Married   ROS  Review of Systems  Constitutional: Positive for malaise/fatigue. Negative for weight gain.  Cardiovascular:  Positive for chest pain. Negative for claudication, leg swelling, near-syncope, orthopnea, palpitations, paroxysmal nocturnal dyspnea and syncope.  Respiratory:  Positive for shortness of breath.   Neurological:  Negative for dizziness.  Objective   Vitals with BMI  08/01/2021 07/27/2021 05/23/2021  Height 5\' 6"  5\' 6"  5\' 6"   Weight 180 lbs 180 lbs 181 lbs 10 oz  BMI 29.07 82.80 03.49  Systolic 179 150 569  Diastolic 72 794 83  Pulse 71 67 63    Orthostatic VS for the past 72 hrs (Last 3 readings):  Patient Position BP Location  08/01/21 1009 Sitting Left Arm    Physical Exam Vitals reviewed.  Constitutional:      Appearance: She is well-developed.  Neck:     Vascular: No carotid bruit.  Cardiovascular:     Rate and Rhythm: Normal rate and regular rhythm.     Pulses: Intact distal pulses.     Heart sounds: Murmur heard.  Midsystolic murmur is present with a grade of 2/6 radiating to the apex.    No gallop.     Comments: No JVD. Pulmonary:     Effort: Pulmonary effort is normal.     Breath sounds: Normal breath sounds.  Musculoskeletal:     Cervical back: Normal range of motion.     Right lower leg: No edema.     Left lower leg: No edema.  Neurological:     Mental Status: She is alert.   Laboratory examination:   Recent Labs    08/31/20 0911 03/08/21 1012 05/18/21 0916  NA 139 139 139  K 4.5 4.9 4.7  CL 104 103 102  CO2 25 26 23   GLUCOSE 91 93 99  BUN 18 13 12   CREATININE 0.88 0.83 0.69  CALCIUM 10.0 10.2 9.6  GFRNONAA >60 >60  --    CrCl cannot be  calculated (Patient's most recent lab result is older than the maximum 21 days allowed.).  CMP Latest Ref Rng & Units 05/18/2021 03/08/2021 08/31/2020  Glucose 70 - 99 mg/dL 99 93 91  BUN 6 - 24 mg/dL 12 13 18   Creatinine 0.57 - 1.00 mg/dL 0.69 0.83 0.88  Sodium 134 - 144 mmol/L 139 139 139  Potassium 3.5 - 5.2 mmol/L 4.7 4.9 4.5  Chloride 96 - 106 mmol/L 102 103 104  CO2 20 - 29 mmol/L 23 26 25   Calcium 8.7 - 10.2 mg/dL 9.6 10.2 10.0  Total Protein 6.5 - 8.1 g/dL - 8.3(H) 8.5(H)  Total Bilirubin 0.3 - 1.2 mg/dL - 0.6 0.5  Alkaline Phos 38 - 126 U/L - 70 73  AST 15 - 41 U/L - 26 30  ALT 0 - 44 U/L - 39 39   CBC Latest Ref Rng & Units 03/08/2021 08/31/2020 01/15/2020  WBC 4.0 - 10.5 K/uL 4.6 3.3(L) 5.2  Hemoglobin 12.0 - 15.0 g/dL 13.0 12.7 13.1  Hematocrit 36.0 - 46.0 % 38.8 38.2 38.2  Platelets 150 - 400 K/uL 284 262 226   Lipid Panel Recent Labs    05/18/21 0916  CHOL 238*  TRIG 226*  LDLCALC 158*  HDL 38*     Cholesterol, total 205.000 m 04/05/2020 HDL 64.000 mg 04/05/2020 LDL 119.000 m 04/05/2020 Triglycerides 125.000 m 04/05/2020  HEMOGLOBIN A1C Lab Results  Component Value Date   HGBA1C 5.5 01/27/2013   TSH No results for input(s): TSH in the last 8760 hours.  Allergies   Allergies  Allergen Reactions   Penicillins Shortness Of Breath and Rash    Has patient had a PCN reaction causing immediate rash, facial/tongue/throat swelling, SOB or lightheadedness with hypotension: no Has patient had a PCN reaction causing severe rash involving mucus membranes or skin necrosis: no Has patient had a PCN reaction that  required hospitalization no Has patient had a PCN reaction occurring within the last 10 years: no If all of the above answers are "NO", then may proceed with Cephalosporin use.  Other reaction(s): Unknown   Codeine Other (See Comments)    Manic depressive Other reaction(s): Unknown   Voltaren [Diclofenac] Nausea And Vomiting and Other (See Comments)    Pt stated  "tore stomach up"   Crestor [Rosuvastatin] Other (See Comments)    Myalgia   Diclofenac Sodium    Vytorin [Ezetimibe-Simvastatin] Other (See Comments)    Severe myalgia   Erythromycin Rash    Other reaction(s): Unknown    Medications Prior to Visit:   Outpatient Medications Prior to Visit  Medication Sig Dispense Refill   benzonatate (TESSALON) 200 MG capsule Take 1 capsule (200 mg total) by mouth 3 (three) times daily as needed for cough. 30 capsule 0   carvedilol (COREG) 6.25 MG tablet TAKE 1 TABLET BY MOUTH TWICE DAILY WITH A MEAL 120 tablet 0   dextromethorphan-guaiFENesin (MUCINEX DM) 30-600 MG 12hr tablet Take 1 tablet by mouth 2 (two) times daily.     EPINEPHrine 0.3 mg/0.3 mL IJ SOAJ injection Inject 0.3 mg into the muscle as needed for anaphylaxis. 1 each 0   FLUoxetine (PROZAC) 40 MG capsule Take 40 mg by mouth daily.     lisinopril (ZESTRIL) 10 MG tablet Take 1 tablet (10 mg total) by mouth daily. 90 tablet 3   LORazepam (ATIVAN) 0.5 MG tablet Take 1-2 tablets (0.5-1 mg total) by mouth 3 (three) times daily as needed for anxiety. Prn anxiety 30 tablet 0   tiZANidine (ZANAFLEX) 2 MG tablet Take 1 tablet (2 mg total) by mouth every 6 (six) hours as needed for muscle spasms. 30 tablet 5   azithromycin (ZITHROMAX) 250 MG tablet Take 2 tablets by mouth on first day, then 1 tablet by mouth on days 2 through 5 6 tablet 0   DM-APAP-CPM (CORICIDIN HBP PO) Take 2 capsules by mouth every 4 (four) hours.     DM-APAP-CPM (CORICIDIN HBP PO) Take 2 capsules by mouth every 4 (four) hours.     guaiFENesin (MUCINEX PO) Take 1 tablet by mouth as needed.     Pseudoeph-CPM-DM-APAP (TYLENOL COLD PO) Take 2 capsules by mouth every 4 (four) hours.     No facility-administered medications prior to visit.   Final Medications at End of Visit    Current Meds  Medication Sig   benzonatate (TESSALON) 200 MG capsule Take 1 capsule (200 mg total) by mouth 3 (three) times daily as needed for cough.    carvedilol (COREG) 6.25 MG tablet TAKE 1 TABLET BY MOUTH TWICE DAILY WITH A MEAL   dextromethorphan-guaiFENesin (MUCINEX DM) 30-600 MG 12hr tablet Take 1 tablet by mouth 2 (two) times daily.   EPINEPHrine 0.3 mg/0.3 mL IJ SOAJ injection Inject 0.3 mg into the muscle as needed for anaphylaxis.   FLUoxetine (PROZAC) 40 MG capsule Take 40 mg by mouth daily.   lisinopril (ZESTRIL) 10 MG tablet Take 1 tablet (10 mg total) by mouth daily.   LORazepam (ATIVAN) 0.5 MG tablet Take 1-2 tablets (0.5-1 mg total) by mouth 3 (three) times daily as needed for anxiety. Prn anxiety   tiZANidine (ZANAFLEX) 2 MG tablet Take 1 tablet (2 mg total) by mouth every 6 (six) hours as needed for muscle spasms.   Radiology:  No results found.  Cardiac Studies:   Lexiscan myoview stress test 04/07/2016: 1. Resting EKG demonstrates normal sinus rhythm with  frequent PVCs in the pattern of ventricular bigeminy.  Low-voltage complexes.  PVCs decreased in the recovery phase of Lexiscan stress test. 2. Left ventricular cavity is noted to be mildly dilated with LV end diastolic volume of 179 mL on the rest and stress studies. Marland Kitchen SPECT images demonstrate homogeneous tracer distribution throughout the myocardium.  The left ventricular ejection fraction was calculated or visually estimated to be 33% with global hypokinesis. Findings are consistent with nonischemic cardiomyopathy.  Holter monitor 06/06/2017: Sinus rhythm.  Minimum heart rate 54 bpm, average heart rate 71 bpm, maximum heart rate 102 bpm.  Total ventricular ectopy 3.3%.  Less than 1% supraventricular ectopy  No atrial fibrillation, atrial flutter, ventricular tachycardia.  Echocardiogram 10/04/2020: Left ventricle cavity is normal in size and ventricular wall thickness. Abnormal septal wall motion due to left bundle branch block and moderate global hypokinesis. Cannot exclude apical thrombus. LVEF 35-40%. Grade 1 diastolic function, normal left atrial pressure.  Calculated  EF 58%. Left atrial cavity is moderately dilated. Mild (grade 1) mitral regurgitation.  No evidence of pulmonary hypertension. No significant change compared to previous study on 12/30/2019.    EKG   05/23/2021: Normal sinus rhythm at rate of 59 bpm, left bundle branch block.  No further analysis.  No significant change from prior EKG 11/01/2020. No significant change from 12/23/2019.    Assessment     ICD-10-CM   1. Nonischemic cardiomyopathy (HCC)  I42.8 PCV ECHOCARDIOGRAM COMPLETE    2. Hypercholesteremia  E78.00 Lipid Panel With LDL/HDL Ratio    3. Exertional chest pain  R07.9 PCV MYOCARDIAL PERFUSION WO LEXISCAN    No orders of the defined types were placed in this encounter.  No orders of the defined types were placed in this encounter.   Recommendations:   Lindsey Rodriguez  is a 56 y.o. Caucasian female, with frequent PVCs and  new onset left bundle branch block and non ischemic cardiomyopathy diagnosed in 2017 with severe LV systolic dysfunction, EF has improved with medical therapy to around 40 to 45%.  She has hypertension and hyperlipidemia.  She has also been diagnosed with IgG lambda MGUS, small fiber neuropathy, anxiety and depression, panic attacks and severe degenerative L5S1 spine disease and also fibromyalgia. She underwent L4-S1 fusion in January 2022.   Patient has been unable to tolerate Entresto due to fatigue and shortness of breath, she therefore switched back to lisinopril which she previously tolerated without issue.  At last office visit recommended very challenging statin therapy and initiating Zetia, however patient was resistant to this and preferred to make diet and lifestyle modifications first.  Patient now presents for 42-month follow-up.  Given continued exertional chest pain shared decision was to proceed with treadmill nuclear stress test to evaluate for underlying ischemia as it has been since 2017 since patient underwent ischemic evaluation.  In  regard to hyperlipidemia we will repeat lipid profile testing, if lipids remain uncontrolled would recommend starting Livalo and/or Zetia.  Given LV dysfunction we will repeat echocardiogram to reevaluate prior to next office visit in 6 months.  Follow-up in 6 months, sooner if needed, for cardiomyopathy, hyperlipidemia, results of cardiac testing.   Alethia Berthold, PA-C 08/01/2021, 10:46 AM Office: [phone]

## 2021-08-01 ENCOUNTER — Other Ambulatory Visit: Payer: Self-pay

## 2021-08-01 ENCOUNTER — Ambulatory Visit: Payer: Medicare PPO | Admitting: Student

## 2021-08-01 ENCOUNTER — Encounter: Payer: Self-pay | Admitting: Student

## 2021-08-01 VITALS — BP 123/72 | HR 71 | Temp 98.4°F | Ht 66.0 in | Wt 180.0 lb

## 2021-08-01 DIAGNOSIS — R079 Chest pain, unspecified: Secondary | ICD-10-CM

## 2021-08-01 DIAGNOSIS — I428 Other cardiomyopathies: Secondary | ICD-10-CM | POA: Diagnosis not present

## 2021-08-01 DIAGNOSIS — E7800 Pure hypercholesterolemia, unspecified: Secondary | ICD-10-CM | POA: Diagnosis not present

## 2021-09-01 ENCOUNTER — Encounter: Payer: Self-pay | Admitting: Cardiology

## 2021-09-01 ENCOUNTER — Encounter: Payer: Self-pay | Admitting: Family Medicine

## 2021-09-02 NOTE — Telephone Encounter (Signed)
From patient.

## 2021-09-05 ENCOUNTER — Other Ambulatory Visit: Payer: Medicare PPO

## 2021-09-06 NOTE — Progress Notes (Signed)
Chief Complaint  ?Patient presents with  ? Med check  ?  6 month follow up. Patient came in this morning and had her labs drawn (blood is in lab). Patient complains that she is easily chilled. She sees [name] next week, feels like her meds are not working, she will discuss with her. Has a stress test scheduled for 2 weeks.   ? ? ?Patient presents for 6 month follow-up as requested by Dr. Irene Limbo. ?He last saw her in 04/2021, having done labs in 03/2021. At that time, the myeloma panel showed stable M spike at 1.1 g/dL down from 1.3 g/dL.  She still had no hypercalcemia, renal insufficiency or anemia, no evidence of progression to multiple myeloma. He didn't feel a bone marrow biopsy was indicated. ?Dr. Irene Limbo recommended 6 month f/u with PCP.  She presents today, due for CBC, c-met and myeloma panel. ?She will f/u with Dr. Irene Limbo just once yearly, in the Fall. ? ?Non ischemic cardiomyopathy, with mixed hyperlipidemia: She last saw cardiologist 08/01/2021. Last lipids were abnormal: ?Lab Results  ?Component Value Date  ? CHOL 238 (H) 05/18/2021  ? HDL 38 (L) 05/18/2021  ? LDLCALC 158 (H) 05/18/2021  ? TRIG 226 (H) 05/18/2021  ? CHOLHDL 3.2 04/05/2020  ? ?At her January visit pt wanted to work harder on diet, rather than starting medication.  She has not been able to tolerate Crestor, Vytorin and also Lipitor due to severe muscle aches and cramps. The plan is to repeat lipid profile testing, and if lipids remain uncontrolled, they would recommend starting Livalo and/or Zetia.  She hasn't had these labs done for them yet, so we are drawing them today. ?She is scheduled for stress test. ? ?She eliminated soft drinks, decreased alcohol (1 drink/day until 2 weeks ago, none in the last 2 weeks). She is eating red meat 1-2x/week.  She cut back on cheese.  Not much butter, some coconut oil. ? ?Hasn't been able to do as much with diet/exercise as she wanted related to anxiety/depression. ?Anxiety is much worse, can't be around a lot  of people, feels panicky. Had a panic attack last week. ? ?Hair thinning, nails are weak, trouble staying warm.  Trouble losing weight. Moods worsening. ?Last TSH was 02/2020 ? ? ?PMH, PSH, SH reviewed ? ?Outpatient Encounter Medications as of 09/07/2021  ?Medication Sig Note  ? carvedilol (COREG) 6.25 MG tablet TAKE 1 TABLET BY MOUTH TWICE DAILY WITH A MEAL   ? FLUoxetine (PROZAC) 40 MG capsule Take 40 mg by mouth daily.   ? lisinopril (ZESTRIL) 10 MG tablet Take 1 tablet (10 mg total) by mouth daily.   ? LORazepam (ATIVAN) 0.5 MG tablet Take 1-2 tablets (0.5-1 mg total) by mouth 3 (three) times daily as needed for anxiety. Prn anxiety 09/07/2021: Took yesterday  ? EPINEPHrine 0.3 mg/0.3 mL IJ SOAJ injection Inject 0.3 mg into the muscle as needed for anaphylaxis. (Patient not taking: Reported on 09/07/2021)   ? tiZANidine (ZANAFLEX) 2 MG tablet Take 1 tablet (2 mg total) by mouth every 6 (six) hours as needed for muscle spasms. (Patient not taking: Reported on 09/07/2021) 04/14/2021: Uses prn for headaches, 2x/month  ? [DISCONTINUED] benzonatate (TESSALON) 200 MG capsule Take 1 capsule (200 mg total) by mouth 3 (three) times daily as needed for cough.   ? [DISCONTINUED] dextromethorphan-guaiFENesin (MUCINEX DM) 30-600 MG 12hr tablet Take 1 tablet by mouth 2 (two) times daily. 07/27/2021: stopped  ? ?No facility-administered encounter medications on file as of 09/07/2021.  ? ?  Allergies  ?Allergen Reactions  ? Penicillins Shortness Of Breath and Rash  ?  Has patient had a PCN reaction causing immediate rash, facial/tongue/throat swelling, SOB or lightheadedness with hypotension: no ?Has patient had a PCN reaction causing severe rash involving mucus membranes or skin necrosis: no ?Has patient had a PCN reaction that required hospitalization no ?Has patient had a PCN reaction occurring within the last 10 years: no ?If all of the above answers are "NO", then may proceed with Cephalosporin use. ? ?Other reaction(s): Unknown  ?  Codeine Other (See Comments)  ?  Manic depressive ?Other reaction(s): Unknown  ? Voltaren [Diclofenac] Nausea And Vomiting and Other (See Comments)  ?  Pt stated "tore stomach up"  ? Crestor [Rosuvastatin] Other (See Comments)  ?  Myalgia  ? Diclofenac Sodium   ? Vytorin [Ezetimibe-Simvastatin] Other (See Comments)  ?  Severe myalgia  ? Erythromycin Rash  ?  Other reaction(s): Unknown  ? ? ?ROS:  no fever, chills, URI symptoms. ?No GI or GU complaints, ?No dizziness, chest pain, shortness of breath or edema. ?Chest pain and palpitations only with panic attacks. ?Neuropathy in feet--cold, tingly ?Headaches, back pain, some neck pain--all chronic, unchanged. ?Depression/anxiety not well controlled, per HPI ? ? ?PHYSICAL EXAM: ? ?BP 120/72   Pulse 72   Ht _0  (1.676 m)   Wt 180 lb 3.2 oz (81.7 kg)   LMP 12/16/2012   BMI 29.09 kg/m?  ? ?Wt Readings from Last 3 Encounters:  ?09/07/21 180 lb 3.2 oz (81.7 kg)  ?08/01/21 180 lb (81.6 kg)  ?07/27/21 180 lb (81.6 kg)  ? ?Well developed, well nourished patient, in no distress ?Neck: No lymphadenopathy or thyromegaly, no carotid bruit. WHSS ?Heart:  Regular rate and rhythm, no murmurs, rubs, gallops or ectopy ?Lungs:  Clear bilaterally, without wheezes, rales or ronchi ?Abdomen:  Soft, nontender, nondistended, no hepatosplenomegaly or masses, normal bowel sounds ?Extremities:  No clubbing, cyanosis or edema, 2+ pulses.  ?Neuro:  Alert and oriented x 3.  Normal gait ?Skin: no rashes or suspicious lesions ?Psych:  Normal mood, affect, hygiene and grooming, normal speech, eye contact.  Full range of affect, happily telling me about her trip to the [place] in January. ? ? ? ?ASSESSMENT/PLAN: ?  ?MGUS (monoclonal gammopathy of unknown significance) - due for routine labs, will forward to Dr. Irene Limbo - Plan: Comprehensive metabolic panel, CBC with Differential/Platelet, Multiple Myeloma Panel (SPEP&IFE w/QIG) ? ?Mixed hyperlipidemia - Due for recheck per cardiology.  Doubt they will  be better, as diet really hasn't changed much (just some very recent changes) - Plan: Lipid Panel With LDL/HDL Ratio ? ?Hair loss - Plan: TSH ? ?Anxiety and depression - worsening.  Has f/u with [name] soon.  Discussed therapists/counseling - Plan: TSH ? ?Fatigue, unspecified type - Plan: TSH ? ? ?I will forward your labs to Dr. Irene Limbo for his information. Assuming that they look good, and no progression/worsening, you should schedule an appointment to see him in September/October for your yearly visit. ? ?I will forward your lipids to Lawerance Cruel (PA at Dr. Irven Shelling office) ? ? ?Discussed healthy diet ?Rec therapists ? ? ?

## 2021-09-06 NOTE — Patient Instructions (Signed)
?  I will forward your labs to Dr. Irene Limbo for his information. Assuming that they look good, and no progression/worsening, you should schedule an appointment to see him in September/October for your yearly visit. ? ?I will forward your lipids to Lawerance Cruel (PA at Dr. Irven Shelling office) ?

## 2021-09-07 ENCOUNTER — Ambulatory Visit: Payer: Medicare PPO | Admitting: Family Medicine

## 2021-09-07 ENCOUNTER — Encounter: Payer: Self-pay | Admitting: Family Medicine

## 2021-09-07 ENCOUNTER — Other Ambulatory Visit: Payer: Self-pay

## 2021-09-07 VITALS — BP 120/72 | HR 72 | Ht 66.0 in | Wt 180.2 lb

## 2021-09-07 DIAGNOSIS — F419 Anxiety disorder, unspecified: Secondary | ICD-10-CM | POA: Diagnosis not present

## 2021-09-07 DIAGNOSIS — R5383 Other fatigue: Secondary | ICD-10-CM

## 2021-09-07 DIAGNOSIS — L659 Nonscarring hair loss, unspecified: Secondary | ICD-10-CM

## 2021-09-07 DIAGNOSIS — F32A Depression, unspecified: Secondary | ICD-10-CM | POA: Diagnosis not present

## 2021-09-07 DIAGNOSIS — E782 Mixed hyperlipidemia: Secondary | ICD-10-CM

## 2021-09-07 DIAGNOSIS — D472 Monoclonal gammopathy: Secondary | ICD-10-CM

## 2021-09-08 ENCOUNTER — Other Ambulatory Visit: Payer: Self-pay | Admitting: Student

## 2021-09-08 DIAGNOSIS — E7800 Pure hypercholesterolemia, unspecified: Secondary | ICD-10-CM

## 2021-09-08 DIAGNOSIS — I428 Other cardiomyopathies: Secondary | ICD-10-CM

## 2021-09-08 LAB — TSH: TSH: 1.75 u[IU]/mL (ref 0.450–4.500)

## 2021-09-08 MED ORDER — EZETIMIBE 10 MG PO TABS: 10.0000 mg | ORAL_TABLET | Freq: Every day | ORAL | 3 refills | Status: DC

## 2021-09-08 MED ORDER — PRAVASTATIN SODIUM 40 MG PO TABS: 40.0000 mg | ORAL_TABLET | Freq: Every day | ORAL | 3 refills | Status: DC

## 2021-09-12 LAB — CBC WITH DIFFERENTIAL/PLATELET
Basophils Absolute: 0 10*3/uL (ref 0.0–0.2)
Basos: 1 %
EOS (ABSOLUTE): 0 10*3/uL (ref 0.0–0.4)
Eos: 1 %
Hematocrit: 38.8 % (ref 34.0–46.6)
Hemoglobin: 12.8 g/dL (ref 11.1–15.9)
Immature Grans (Abs): 0 10*3/uL (ref 0.0–0.1)
Immature Granulocytes: 0 %
Lymphocytes Absolute: 1.8 10*3/uL (ref 0.7–3.1)
Lymphs: 55 %
MCH: 33.2 pg — ABNORMAL HIGH (ref 26.6–33.0)
MCHC: 33 g/dL (ref 31.5–35.7)
MCV: 101 fL — ABNORMAL HIGH (ref 79–97)
Monocytes Absolute: 0.4 10*3/uL (ref 0.1–0.9)
Monocytes: 12 %
Neutrophils Absolute: 1 10*3/uL — ABNORMAL LOW (ref 1.4–7.0)
Neutrophils: 31 %
Platelets: 237 10*3/uL (ref 150–450)
RBC: 3.86 x10E6/uL (ref 3.77–5.28)
RDW: 12.9 % (ref 11.7–15.4)
WBC: 3.3 10*3/uL — ABNORMAL LOW (ref 3.4–10.8)

## 2021-09-12 LAB — LIPID PANEL WITH LDL/HDL RATIO
Cholesterol, Total: 265 mg/dL — ABNORMAL HIGH (ref 100–199)
HDL: 48 mg/dL (ref >39)
LDL Chol Calc (NIH): 171 mg/dL — ABNORMAL HIGH (ref 0–99)
LDL/HDL Ratio: 3.6 ratio — ABNORMAL HIGH (ref 0.0–3.2)
Triglycerides: 247 mg/dL — ABNORMAL HIGH (ref 0–149)
VLDL Cholesterol Cal: 46 mg/dL — ABNORMAL HIGH (ref 5–40)

## 2021-09-12 LAB — COMPREHENSIVE METABOLIC PANEL
ALT: 70 IU/L — ABNORMAL HIGH (ref 0–32)
AST: 54 IU/L — ABNORMAL HIGH (ref 0–40)
Albumin/Globulin Ratio: 1.7 (ref 1.2–2.2)
Albumin: 5.1 g/dL — ABNORMAL HIGH (ref 3.8–4.9)
Alkaline Phosphatase: 81 IU/L (ref 44–121)
BUN/Creatinine Ratio: 23 (ref 9–23)
BUN: 17 mg/dL (ref 6–24)
Bilirubin Total: 0.4 mg/dL (ref 0.0–1.2)
CO2: 24 mmol/L (ref 20–29)
Calcium: 10.2 mg/dL (ref 8.7–10.2)
Chloride: 101 mmol/L (ref 96–106)
Creatinine, Ser: 0.75 mg/dL (ref 0.57–1.00)
Globulin, Total: 3 g/dL (ref 1.5–4.5)
Glucose: 101 mg/dL — ABNORMAL HIGH (ref 70–99)
Potassium: 4.9 mmol/L (ref 3.5–5.2)
Sodium: 140 mmol/L (ref 134–144)
Total Protein: 8.1 g/dL (ref 6.0–8.5)
eGFR: 93 mL/min/{1.73_m2} (ref >59)

## 2021-09-12 LAB — MULTIPLE MYELOMA PANEL, SERUM
Albumin SerPl Elph-Mcnc: 4.4 g/dL (ref 2.9–4.4)
Albumin/Glob SerPl: 1.2 (ref 0.7–1.7)
Alpha 1: 0.1 g/dL (ref 0.0–0.4)
Alpha2 Glob SerPl Elph-Mcnc: 0.7 g/dL (ref 0.4–1.0)
B-Globulin SerPl Elph-Mcnc: 1.2 g/dL (ref 0.7–1.3)
Gamma Glob SerPl Elph-Mcnc: 1.7 g/dL (ref 0.4–1.8)
Globulin, Total: 3.7 g/dL (ref 2.2–3.9)
IgA/Immunoglobulin A, Serum: 73 mg/dL — ABNORMAL LOW (ref 87–352)
IgG (Immunoglobin G), Serum: 1738 mg/dL — ABNORMAL HIGH (ref 586–1602)
IgM (Immunoglobulin M), Srm: 53 mg/dL (ref 26–217)
M Protein SerPl Elph-Mcnc: 1.1 g/dL — ABNORMAL HIGH

## 2021-09-15 DIAGNOSIS — R635 Abnormal weight gain: Secondary | ICD-10-CM | POA: Diagnosis not present

## 2021-09-15 DIAGNOSIS — R7989 Other specified abnormal findings of blood chemistry: Secondary | ICD-10-CM | POA: Diagnosis not present

## 2021-09-15 DIAGNOSIS — E782 Mixed hyperlipidemia: Secondary | ICD-10-CM | POA: Diagnosis not present

## 2021-09-15 DIAGNOSIS — N951 Menopausal and female climacteric states: Secondary | ICD-10-CM | POA: Diagnosis not present

## 2021-09-15 DIAGNOSIS — E559 Vitamin D deficiency, unspecified: Secondary | ICD-10-CM | POA: Diagnosis not present

## 2021-09-15 DIAGNOSIS — Z6829 Body mass index (BMI) 29.0-29.9, adult: Secondary | ICD-10-CM | POA: Diagnosis not present

## 2021-09-16 ENCOUNTER — Other Ambulatory Visit: Payer: Self-pay | Admitting: Student

## 2021-09-16 DIAGNOSIS — I10 Essential (primary) hypertension: Secondary | ICD-10-CM

## 2021-09-21 ENCOUNTER — Other Ambulatory Visit: Payer: Self-pay

## 2021-09-21 ENCOUNTER — Ambulatory Visit: Payer: Medicare PPO

## 2021-09-21 DIAGNOSIS — R079 Chest pain, unspecified: Secondary | ICD-10-CM | POA: Diagnosis not present

## 2021-09-22 DIAGNOSIS — Z6829 Body mass index (BMI) 29.0-29.9, adult: Secondary | ICD-10-CM | POA: Diagnosis not present

## 2021-09-22 DIAGNOSIS — M5136 Other intervertebral disc degeneration, lumbar region: Secondary | ICD-10-CM | POA: Diagnosis not present

## 2021-09-22 DIAGNOSIS — R635 Abnormal weight gain: Secondary | ICD-10-CM | POA: Diagnosis not present

## 2021-09-22 DIAGNOSIS — R748 Abnormal levels of other serum enzymes: Secondary | ICD-10-CM | POA: Diagnosis not present

## 2021-09-22 DIAGNOSIS — N951 Menopausal and female climacteric states: Secondary | ICD-10-CM | POA: Diagnosis not present

## 2021-09-22 DIAGNOSIS — F329 Major depressive disorder, single episode, unspecified: Secondary | ICD-10-CM | POA: Diagnosis not present

## 2021-09-22 DIAGNOSIS — I4510 Unspecified right bundle-branch block: Secondary | ICD-10-CM | POA: Diagnosis not present

## 2021-09-22 DIAGNOSIS — Z1331 Encounter for screening for depression: Secondary | ICD-10-CM | POA: Diagnosis not present

## 2021-09-22 DIAGNOSIS — Z1339 Encounter for screening examination for other mental health and behavioral disorders: Secondary | ICD-10-CM | POA: Diagnosis not present

## 2021-09-23 DIAGNOSIS — Z6829 Body mass index (BMI) 29.0-29.9, adult: Secondary | ICD-10-CM | POA: Diagnosis not present

## 2021-09-23 DIAGNOSIS — E782 Mixed hyperlipidemia: Secondary | ICD-10-CM | POA: Diagnosis not present

## 2021-09-30 DIAGNOSIS — Z6829 Body mass index (BMI) 29.0-29.9, adult: Secondary | ICD-10-CM | POA: Diagnosis not present

## 2021-09-30 DIAGNOSIS — M2550 Pain in unspecified joint: Secondary | ICD-10-CM | POA: Diagnosis not present

## 2021-10-03 ENCOUNTER — Encounter: Payer: Self-pay | Admitting: Cardiology

## 2021-10-03 NOTE — Telephone Encounter (Signed)
From pt

## 2021-10-04 NOTE — Progress Notes (Signed)
Pt stated that she has not had chest pain and that she is booked out the next few weeks and does not understand why she hs to come in if the results were inconclusive. Please advise.

## 2021-10-05 NOTE — Progress Notes (Signed)
Called pt to inform her about the message above pt made an appt for 06/05/ after echo

## 2021-10-05 NOTE — Progress Notes (Signed)
In order to reevaluate if further testing is necessary. It is not urgent that we see her especially if not having chest pain. However I would not necessarily wait until appt in July.

## 2021-10-10 DIAGNOSIS — Z6829 Body mass index (BMI) 29.0-29.9, adult: Secondary | ICD-10-CM | POA: Diagnosis not present

## 2021-10-10 DIAGNOSIS — E782 Mixed hyperlipidemia: Secondary | ICD-10-CM | POA: Diagnosis not present

## 2021-10-17 DIAGNOSIS — M2550 Pain in unspecified joint: Secondary | ICD-10-CM | POA: Diagnosis not present

## 2021-10-17 DIAGNOSIS — Z6829 Body mass index (BMI) 29.0-29.9, adult: Secondary | ICD-10-CM | POA: Diagnosis not present

## 2021-10-24 NOTE — Progress Notes (Signed)
? ?NEUROLOGY FOLLOW UP OFFICE NOTE ? ?Lindsey Rodriguez ?[id number] ? ?Assessment/Plan:  ? ?1.  Migraine without aura, without status migrainosus, not intractable ?2.  Occipital neuralgia/cervicogenic headache, stable ?3.  Tension-type headache ?4.  Small fiber neuropathy, idiopathic  ?  ?1.  Tizanidine as needed for headache ?2.   Continue monitoring neuropathy ?3.   Follow up one year or as needed. ?  ?Subjective:  ?Lindsey Rodriguez is a 56 year old right-handed female with chronic neck and back pain with neuropathy, fibromyalgia, cardiomyopathy, MGUS, hypertension, plantar fasciitis, and history of kidney sones, depression and lumbosacral radiculopathy who follows up for migraines ?  ?UPDATE: ?ACE level last April was 12.  Neuropathy is maybe slightly progressed but nothing significant.  When she first stands up, she feels wobbly for a moment but becomes steady as she starts moving.  She notes some mild numbness in the fingertips.  No pain.   ? ?Headaches are overall controlled, especially considering current seasonal allergies.  She has about 6 headaches a month now, 2-3 are migraines and the rest are tension-type or sinus headaches.  Treats migraines with Extra-strength Tylenol, tizanidine for tension type headache and uses her Tylenol Allergy Sinus for allergy-related headaches.   ? ?  ?Current NSAIDS: none ?Current analgesics:  Extra-strength Tylenol ?Current triptans:  no ?Current ergotamine:  no ?Current anti-emetic:  no ?Current muscle relaxants:  Tizanidine '4mg'$  PRN (also for neck stiffness) ?Current anti-anxiolytic:  lorazepam ?Current sleep aide:  no ?Current Antihypertensive medications:  Lisinopril, Coreg ?Current Antidepressant medications:  fluoxetine '80mg'$  ?Current Anticonvulsant medications:  none ?Current anti-CGRP:  None ?Current Vitamins/Herbal/Supplements:  CoQ10 '30mg'$ ; 65 Fe ?Current Antihistamines/Decongestants:  Tylenol Allergy Sinus ?Other therapy:  no ?  ?Depression:  yes; Anxiety:   yes ?Other pain:  Chronic low back pain - lumbar disc disease, small fiber neuropathy ?  ?  ?HISTORY: ?Onset:  Childhood ?Location:  Varies (back of head, crown, bi-temporal, retro-orbital, band-like) ?Quality:  Retro-orbital throbbing, band-like vice, stabbing at back of head ?Initial Intensity:  4/10 constant, 8/10 when severe; August: Tension 6-9/10; Migraine 9/10 ?Aura:  no ?Prodrome:  no ?Associated symptoms:  Nausea, photophobia, phonophobia, tunnel vision.  No associated vomiting or unilateral numbness or weakness. ?Initial Duration:  Constant but severe episodes 8 hours (with sumatriptan and naproxen); August: Tension 7mn to 3 hours; Migraine until goes to sleep ?Initial Frequency:  Daily but severe episodes occur once a week; August: Tension 3 days per week; Migraine once every 2 weeks) ?Triggers/aggravating factors:  Emotional stress, working at the computer; seasonal allergies. ?Relieving factors:  Ice pack and laying in dark room ?Activity:  Cannot function at least once a week. ?  ?She has chronic neck pain status post C4-T2 fusion.  Last MRI of cervical spine from 11/26/14 showed progression of degenerative disc disease at C3-4 with left paracentral protrusion contacts and deforms the left hemicord. ?  ?Past NSAIDs:  Naproxen; ibuprofen ?Past analgesics:  Tramadol, lidocaine nasal drops ?Past triptans:  Relpax '40mg'$ , Treximet, Maxalt, Zomig no, Zomig NS, sumatriptan '100mg'$ , sumatriptan NS, Onzetra Xsail ?Past antihypertensives:  Propranolol (caused hypotension), amlodipine ?Past antidepressants:  Cymbalta, nortriptyline, venlafaxine, sertraline ?Past antiepileptics:  Topiramate, zonisamide, Lyrica (cognitive difficulties), Depakote, gabapentin (sleepiness). ?Past anti-CGRP:  Aimovig, Emgality (could not afford) ?Other past therapy:  biofeedback ?  ?She has known chronic neck pain status post C4-T2 fusion with degenerative disc disease of the cervical spine.  Due to worsening neck pain, MRI of cervical  spine was performed on 12/21/2018 which demonstrated multilevel  degenrative changes with mild facet disease at C2-3 and C3-4, post-surgical C4-T2 fusion with chronic deformity of right hemicord at C4-5, central and leftward protrusion at C3-4 with partial regression of disc herniation (since 2016) and slightly greater anterolisthesis related to facet arthropathy with slight left-sided cord flattening and slight left C4 foraminal narrowing.  She was referred to Dr. Payton Mccallum at [facility].  She has received two nerve blocks which were effective.  She underwent upper cervical nerve root ablation around December 2020 which has been effective for the neck pain and occipital neuralgia.  ?  ? In 2020, she also reported increased numbness and tingling in the feet (worse on the right).  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  Labs from January 2022, including B12, Sjogren's, Sed rate, ANA, and TSH were normal.  B6 was elevated at 130.6.  She was advised to discontinue any B6 containing foods.  Immunofixation from March 2022 showed IgG monoclonal protein with lambda light chain (patient has MGUS).  ACE level from April 2022 was 12.  Punch skin biopsy was indicative of small fiber neuropathy ? ?PAST MEDICAL HISTORY: ?Past Medical History:  ?Diagnosis Date  ? Anemia   ? Anxiety panic attacks  ? [name]  ? Arthritis   ? knees  ? Cardiomyopathy, nonischemic ([facility]) 04/07/2016  ? EF left ventricule calculated on Lexiscan stress test at 33% with global hypokinesis.   ? Chronic back pain   ? Chronic neck pain   ? COVID-19 06/2021  ? treated with Paxlovid through [facility] in [place]  ? Depression   ? Dysrhythmia   ? pvc's with cardiomyopathy  ? Family history of adverse reaction to anesthesia   ? mother has problems waking up  ? Fibromyalgia   ? and neuropathy of feet  ? History of kidney stones   ? lithotripsy  03/2016  ? Hyperlipidemia   ? Hypertension   ? Kidney stone   ? History  ? Kidney stones h/o  ?  Melanoma ([place]) RUE  ? [name]  ? MGUS (monoclonal gammopathy of unknown significance) 06/2015  ? no treatment just watching recheck blood work 12/2015  ? Migraine   ? OTC med prn - stress related  ? Plantar fasciitis right  ? Renal stones 2008/2011/2017  ? Seasonal allergies   ? SVD (spontaneous vaginal delivery)   ? x 3  ? Tendonitis of foot right  ? ? ?MEDICATIONS: ?Current Outpatient Medications on File Prior to Visit  ?Medication Sig Dispense Refill  ? carvedilol (COREG) 6.25 MG tablet TAKE 1 TABLET BY MOUTH TWICE DAILY WITH A MEAL 120 tablet 0  ? EPINEPHrine 0.3 mg/0.3 mL IJ SOAJ injection Inject 0.3 mg into the muscle as needed for anaphylaxis. (Patient not taking: Reported on 09/07/2021) 1 each 0  ? ezetimibe (ZETIA) 10 MG tablet Take 1 tablet (10 mg total) by mouth daily. 30 tablet 3  ? FLUoxetine (PROZAC) 40 MG capsule Take 40 mg by mouth daily.    ? lisinopril (ZESTRIL) 10 MG tablet Take 1 tablet (10 mg total) by mouth daily. 90 tablet 3  ? LORazepam (ATIVAN) 0.5 MG tablet Take 1-2 tablets (0.5-1 mg total) by mouth 3 (three) times daily as needed for anxiety. Prn anxiety 30 tablet 0  ? pravastatin (PRAVACHOL) 40 MG tablet Take 1 tablet (40 mg total) by mouth daily. 30 tablet 3  ? tiZANidine (ZANAFLEX) 2 MG tablet Take 1 tablet (2 mg total) by mouth every 6 (six) hours as  needed for muscle spasms. (Patient not taking: Reported on 09/07/2021) 30 tablet 5  ? ?No current facility-administered medications on file prior to visit.  ? ? ?ALLERGIES: ?Allergies  ?Allergen Reactions  ? Penicillins Shortness Of Breath and Rash  ?  Has patient had a PCN reaction causing immediate rash, facial/tongue/throat swelling, SOB or lightheadedness with hypotension: no ?Has patient had a PCN reaction causing severe rash involving mucus membranes or skin necrosis: no ?Has patient had a PCN reaction that required hospitalization no ?Has patient had a PCN reaction occurring within the last 10 years: no ?If all of the above answers are  "NO", then may proceed with Cephalosporin use. ? ?Other reaction(s): Unknown  ? Codeine Other (See Comments)  ?  Manic depressive ?Other reaction(s): Unknown  ? Voltaren [Diclofenac] Nausea And Vomiting and Othe

## 2021-10-25 ENCOUNTER — Ambulatory Visit: Payer: Medicare PPO | Admitting: Neurology

## 2021-10-25 ENCOUNTER — Encounter: Payer: Self-pay | Admitting: Neurology

## 2021-10-25 VITALS — BP 117/65 | HR 68 | Ht 66.0 in | Wt 185.4 lb

## 2021-10-25 DIAGNOSIS — M5030 Other cervical disc degeneration, unspecified cervical region: Secondary | ICD-10-CM | POA: Diagnosis not present

## 2021-10-25 DIAGNOSIS — G6289 Other specified polyneuropathies: Secondary | ICD-10-CM | POA: Diagnosis not present

## 2021-10-25 DIAGNOSIS — G43009 Migraine without aura, not intractable, without status migrainosus: Secondary | ICD-10-CM

## 2021-10-25 DIAGNOSIS — E7800 Pure hypercholesterolemia, unspecified: Secondary | ICD-10-CM | POA: Diagnosis not present

## 2021-10-25 DIAGNOSIS — M5481 Occipital neuralgia: Secondary | ICD-10-CM

## 2021-10-25 DIAGNOSIS — M5417 Radiculopathy, lumbosacral region: Secondary | ICD-10-CM

## 2021-10-25 MED ORDER — TIZANIDINE HCL 2 MG PO TABS: 2.0000 mg | ORAL_TABLET | Freq: Four times a day (QID) | ORAL | 11 refills | Status: DC | PRN

## 2021-10-25 NOTE — Patient Instructions (Signed)
Refilled tizanidine ?Follow up one year ?

## 2021-10-26 LAB — CMP14+EGFR
ALT: 43 IU/L — ABNORMAL HIGH (ref 0–32)
AST: 33 IU/L (ref 0–40)
Albumin/Globulin Ratio: 1.5 (ref 1.2–2.2)
Albumin: 4.6 g/dL (ref 3.8–4.9)
Alkaline Phosphatase: 74 IU/L (ref 44–121)
BUN/Creatinine Ratio: 12 (ref 9–23)
BUN: 10 mg/dL (ref 6–24)
Bilirubin Total: 0.4 mg/dL (ref 0.0–1.2)
CO2: 21 mmol/L (ref 20–29)
Calcium: 9.9 mg/dL (ref 8.7–10.2)
Chloride: 100 mmol/L (ref 96–106)
Creatinine, Ser: 0.83 mg/dL (ref 0.57–1.00)
Globulin, Total: 3 g/dL (ref 1.5–4.5)
Glucose: 89 mg/dL (ref 70–99)
Potassium: 5.3 mmol/L — ABNORMAL HIGH (ref 3.5–5.2)
Sodium: 136 mmol/L (ref 134–144)
Total Protein: 7.6 g/dL (ref 6.0–8.5)
eGFR: 83 mL/min/{1.73_m2} (ref >59)

## 2021-10-26 LAB — LIPID PANEL WITH LDL/HDL RATIO
Cholesterol, Total: 166 mg/dL (ref 100–199)
HDL: 46 mg/dL (ref >39)
LDL Chol Calc (NIH): 86 mg/dL (ref 0–99)
LDL/HDL Ratio: 1.9 ratio (ref 0.0–3.2)
Triglycerides: 200 mg/dL — ABNORMAL HIGH (ref 0–149)
VLDL Cholesterol Cal: 34 mg/dL (ref 5–40)

## 2021-10-27 DIAGNOSIS — N951 Menopausal and female climacteric states: Secondary | ICD-10-CM | POA: Diagnosis not present

## 2021-10-27 DIAGNOSIS — E782 Mixed hyperlipidemia: Secondary | ICD-10-CM | POA: Diagnosis not present

## 2021-10-27 DIAGNOSIS — F329 Major depressive disorder, single episode, unspecified: Secondary | ICD-10-CM | POA: Diagnosis not present

## 2021-10-27 DIAGNOSIS — Z6829 Body mass index (BMI) 29.0-29.9, adult: Secondary | ICD-10-CM | POA: Diagnosis not present

## 2021-11-03 DIAGNOSIS — E559 Vitamin D deficiency, unspecified: Secondary | ICD-10-CM | POA: Diagnosis not present

## 2021-11-03 DIAGNOSIS — Z6829 Body mass index (BMI) 29.0-29.9, adult: Secondary | ICD-10-CM | POA: Diagnosis not present

## 2021-11-08 ENCOUNTER — Other Ambulatory Visit: Payer: Self-pay | Admitting: Student

## 2021-11-08 DIAGNOSIS — E875 Hyperkalemia: Secondary | ICD-10-CM

## 2021-11-09 ENCOUNTER — Encounter: Payer: Self-pay | Admitting: Cardiology

## 2021-11-09 NOTE — Telephone Encounter (Signed)
From patient.

## 2021-11-09 NOTE — Progress Notes (Signed)
Patient is aware of results.

## 2021-11-11 DIAGNOSIS — E559 Vitamin D deficiency, unspecified: Secondary | ICD-10-CM | POA: Diagnosis not present

## 2021-11-11 DIAGNOSIS — Z6830 Body mass index (BMI) 30.0-30.9, adult: Secondary | ICD-10-CM | POA: Diagnosis not present

## 2021-11-11 DIAGNOSIS — F329 Major depressive disorder, single episode, unspecified: Secondary | ICD-10-CM | POA: Diagnosis not present

## 2021-11-17 DIAGNOSIS — Z6829 Body mass index (BMI) 29.0-29.9, adult: Secondary | ICD-10-CM | POA: Diagnosis not present

## 2021-11-17 DIAGNOSIS — E782 Mixed hyperlipidemia: Secondary | ICD-10-CM | POA: Diagnosis not present

## 2021-11-23 ENCOUNTER — Other Ambulatory Visit: Payer: Self-pay | Admitting: Student

## 2021-11-23 ENCOUNTER — Encounter: Payer: Self-pay | Admitting: Cardiology

## 2021-11-23 DIAGNOSIS — I10 Essential (primary) hypertension: Secondary | ICD-10-CM

## 2021-11-25 DIAGNOSIS — M2550 Pain in unspecified joint: Secondary | ICD-10-CM | POA: Diagnosis not present

## 2021-11-25 DIAGNOSIS — G479 Sleep disorder, unspecified: Secondary | ICD-10-CM | POA: Diagnosis not present

## 2021-11-25 DIAGNOSIS — R632 Polyphagia: Secondary | ICD-10-CM | POA: Diagnosis not present

## 2021-11-25 DIAGNOSIS — Z6829 Body mass index (BMI) 29.0-29.9, adult: Secondary | ICD-10-CM | POA: Diagnosis not present

## 2021-12-05 ENCOUNTER — Ambulatory Visit: Payer: Medicare PPO | Admitting: Student

## 2021-12-05 ENCOUNTER — Other Ambulatory Visit: Payer: Medicare PPO

## 2021-12-06 ENCOUNTER — Encounter: Payer: Self-pay | Admitting: Student

## 2021-12-06 ENCOUNTER — Ambulatory Visit: Payer: Medicare PPO | Admitting: Student

## 2021-12-06 ENCOUNTER — Ambulatory Visit: Payer: Medicare PPO

## 2021-12-06 VITALS — BP 117/76 | HR 79 | Temp 98.1°F | Resp 17 | Ht 66.0 in | Wt 184.4 lb

## 2021-12-06 DIAGNOSIS — I447 Left bundle-branch block, unspecified: Secondary | ICD-10-CM

## 2021-12-06 DIAGNOSIS — R079 Chest pain, unspecified: Secondary | ICD-10-CM | POA: Diagnosis not present

## 2021-12-06 DIAGNOSIS — I10 Essential (primary) hypertension: Secondary | ICD-10-CM | POA: Diagnosis not present

## 2021-12-06 DIAGNOSIS — I428 Other cardiomyopathies: Secondary | ICD-10-CM | POA: Diagnosis not present

## 2021-12-06 NOTE — Progress Notes (Signed)
Primary Physician/Referring:  Rita Ohara, MD  Patient ID: Lindsey Rodriguez, female    DOB: [birth year], 56 y.o.   MRN: [id number]  Chief Complaint  Patient presents with   Follow-up   Results    HPI:    Lindsey Rodriguez  is a 56 y.o.  Caucasian female, with frequent PVCs and  new onset left bundle branch block and non ischemic cardiomyopathy diagnosed in 2017 with severe LV systolic dysfunction, EF has improved with medical therapy to around 40 to 45%.  She has hypertension and hyperlipidemia.  She has also been diagnosed with IgG lambda MGUS, small fiber neuropathy, anxiety and depression, panic attacks and severe degenerative L5S1 spine disease and also fibromyalgia. She underwent L4-S1 fusion in January 2022.  Notably patient has been unable to tolerate higher doses of lisinopril or carvedilol in the past.  Patient has been unable to tolerate Entresto due to fatigue and shortness of breath, she therefore switched back to lisinopril which she previously tolerated without issue.  Patient is now tolerating pravastatin and Zetia without issue and LDL is now well controlled at 86.  Pressure is well controlled.  She has had no recurrence of dyspnea on exertion or chest pain since last office visit.  However she does note that her physical activity has been limited due to recent ankle injury 4 weeks ago.  She is currently walking a maximum of 15 minutes consecutively without chest pain or shortness of breath.  She has not been able to tolerate Crestor, Vytorin and also Lipitor due to severe muscle aches and cramps.     Past Medical History:  Diagnosis Date   Anemia    Anxiety panic attacks   [name]   Arthritis    knees   Cardiomyopathy, nonischemic ([place]) 04/07/2016   EF left ventricule calculated on Lexiscan stress test at 33% with global hypokinesis.    Chronic back pain    Chronic neck pain    COVID-19 06/2021   treated with Paxlovid through [facility] in [place]    pvc's with cardiomyopathy   Family history of adverse reaction to anesthesia    mother has problems waking up   Fibromyalgia    and neuropathy of feet   History of kidney stones    lithotripsy  03/2016   Hyperlipidemia    Hypertension    Kidney stone    History   Kidney stones h/o   Melanoma ([place]) RUE   [name]   MGUS (monoclonal gammopathy of unknown significance) 06/2015   no treatment just watching recheck blood work 12/2015   Migraine    OTC med prn - stress related   Plantar fasciitis right   Renal stones 2008/2011/2017   Seasonal allergies    SVD (spontaneous vaginal delivery)    x 3   Tendonitis of foot right   Past Surgical History:  Procedure Laterality Date   ABLATION ON ENDOMETRIOSIS N/A 11/24/2015   Procedure: ABLATION ON ENDOMETRIOSIS;  Surgeon: [name], MD;  Location: [facility];  Service: Gynecology;  Laterality: N/A;   ANTERIOR LAT LUMBAR FUSION Right 06/15/2016   Procedure: RIGHT LUMBAR THREE-FOUR ANTEROLATERAL LUMBAR INTERBODY FUSION WITH LATERAL PLATE;  Surgeon: Erline Levine, MD;  Location: [facility];  Service: Neurosurgery;  Laterality: Right;   BACK SURGERY  Dr.Stern, last sx Summer 2011   X 4  (L3-S1)   BACK SURGERY  06/2016   BACK SURGERY  07/20/2020   L2-3 lateral  interbody (Left side approach), L2-3 Posterior Spinal Fusion at [facility] 11/24/2015   Procedure: TRANSVAGINAL TAPE (TVT) PROCEDURE;  Surgeon: [name], MD;  Location: [facility];  Service: Gynecology;  Laterality: N/A;   C2-3 nerve ablation Right 06/2019   Dr. Maia Petties (per pt)   COLONOSCOPY     CYSTOCELE REPAIR  11/24/2015   Procedure: ANTERIOR REPAIR (CYSTOCELE);  Surgeon: [name], MD;  Location: [facility];  Service: Gynecology;;   CYSTOSCOPY N/A 11/24/2015   Procedure: Consuela Mimes;  Surgeon: [name], MD;  Location: [facility];  Service: Gynecology;  Laterality: N/A;   DILATION AND CURETTAGE OF UTERUS     ENDOMETRIAL ABLATION   08/2000   EYE SURGERY Bilateral    Lasik    KIDNEY STONE SURGERY  2011   LITHOTRIPSY  08/2017 (L), 03/2018 (R)   MELANOMA EXCISION  05/2015   right forearm; [name]   NECK SURGERY  [name]  Summer 2011   X 4  (C4-T1 Level)   RHINOPLASTY     ROBOTIC ASSISTED BILATERAL SALPINGO OOPHERECTOMY Bilateral 11/24/2015   Procedure: ROBOTIC ASSISTED BILATERAL SALPINGO OOPHORECTOMY;  Surgeon: [name], MD;  Location: [facility];  Service: Gynecology;  Laterality: Bilateral;--OVARIES NOT REMOVED PER PATH REPORT   ROBOTIC ASSISTED TOTAL HYSTERECTOMY WITH SALPINGECTOMY Bilateral 11/24/2015   Procedure: ROBOTIC ASSISTED TOTAL HYSTERECTOMY WITH SALPINGECTOMY;  Surgeon: [name], MD;  Location: [facility];  Service: Gynecology;  Laterality: Bilateral;   TONSILLECTOMY     TOTAL VAGINAL HYSTERECTOMY     WISDOM TOOTH EXTRACTION     Family History  Problem Relation Age of Onset   Hypertension Mother    Depression Mother    Migraines Mother    Hypothyroidism Mother    Hypercholesterolemia Mother    COPD Father    Heart failure Father 29   Healthy Brother    Cardiomyopathy Daughter 27       related to COVID (illness or vaccine?)   Healthy Daughter    Healthy Daughter    Healthy Son    Diabetes Neg Hx    Social History   Tobacco Use   Smoking status: Former    Packs/day: 0.10    Years: 3.00    Pack years: 0.30    Types: Cigarettes    Quit date: 07/04/1987    Years since quitting: 34.4   Smokeless tobacco: Never  Substance Use Topics   Alcohol use: Yes    Comment: 2/week   Marital Status: Married   ROS  Review of Systems  Constitutional: Negative for weight gain.  Cardiovascular:  Negative for chest pain (no recurrence), claudication, dyspnea on exertion, leg swelling, near-syncope, orthopnea, palpitations, paroxysmal nocturnal dyspnea and syncope.  Neurological:  Negative for dizziness.  Objective      12/06/2021    1:47 PM 10/25/2021   10:50 AM 09/07/2021   11:37 AM  Vitals  with BMI  Height '5\' 6"'$  '5\' 6"'$  '5\' 6"'$   Weight 184 lbs 6 oz 185 lbs 6 oz 180 lbs 3 oz  BMI 29.78 76.54 65.0  Systolic 354 656 812  Diastolic 76 65 72  Pulse 79 68 72    Orthostatic VS for the past 72 hrs (Last 3 readings):  Patient Position BP Location Cuff Size  12/06/21 1347 Sitting Left Arm Normal    Physical Exam Vitals reviewed.  Constitutional:      Appearance: She is well-developed. She is obese.  Neck:     Vascular: No carotid bruit.  Cardiovascular:  Rate and Rhythm: Normal rate and regular rhythm.     Pulses: Intact distal pulses.     Heart sounds: Murmur heard.  Midsystolic murmur is present with a grade of 2/6 radiating to the apex.    No gallop.     Comments: No JVD. Pulmonary:     Effort: Pulmonary effort is normal.     Breath sounds: Normal breath sounds.  Musculoskeletal:     Cervical back: Normal range of motion.     Right lower leg: No edema.     Left lower leg: No edema.  Neurological:     Mental Status: She is alert.  Previous office visit.Physical exam unchanged compared  Laboratory examination:   Recent Labs    03/08/21 1012 05/18/21 0916 09/07/21 1220 10/25/21 0814  NA 139 139 140 136  K 4.9 4.7 4.9 5.3*  CL 103 102 101 100  CO2 '26 23 24 21  '$ GLUCOSE 93 99 101* 89  BUN '13 12 17 10  '$ CREATININE 0.83 0.69 0.75 0.83  CALCIUM 10.2 9.6 10.2 9.9  GFRNONAA >60  --   --   --    CrCl cannot be calculated (Patient's most recent lab result is older than the maximum 21 days allowed.).     Latest Ref Rng & Units 10/25/2021    8:14 AM 09/07/2021   12:20 PM 05/18/2021    9:16 AM  CMP  Glucose 70 - 99 mg/dL 89   101   99    BUN 6 - 24 mg/dL '10   17   12    '$ Creatinine 0.57 - 1.00 mg/dL 0.83   0.75   0.69    Sodium 134 - 144 mmol/L 136   140   139    Potassium 3.5 - 5.2 mmol/L 5.3   4.9   4.7    Chloride 96 - 106 mmol/L 100   101   102    CO2 20 - 29 mmol/L '21   24   23    '$ Calcium 8.7 - 10.2 mg/dL 9.9   10.2   9.6    Total Protein 6.0 - 8.5 g/dL 7.6    8.1     Total Bilirubin 0.0 - 1.2 mg/dL 0.4   0.4     Alkaline Phos 44 - 121 IU/L 74   81     AST 0 - 40 IU/L 33   54     ALT 0 - 32 IU/L 43   70         Latest Ref Rng & Units 09/07/2021   12:20 PM 03/08/2021   10:12 AM 08/31/2020    9:11 AM  CBC  WBC 3.4 - 10.8 x10E3/uL 3.3   4.6   3.3    Hemoglobin 11.1 - 15.9 g/dL 12.8   13.0   12.7    Hematocrit 34.0 - 46.6 % 38.8   38.8   38.2    Platelets 150 - 450 x10E3/uL 237   284   262     Lipid Panel Recent Labs    05/18/21 0916 09/07/21 1220 10/25/21 0812  CHOL 238* 265* 166  TRIG 226* 247* 200*  LDLCALC 158* 171* 86  HDL 38* 48 46     Cholesterol, total 205.000 m 04/05/2020 HDL 64.000 mg 04/05/2020 LDL 119.000 m 04/05/2020 Triglycerides 125.000 m 04/05/2020  HEMOGLOBIN A1C Lab Results  Component Value Date   HGBA1C 5.5 01/27/2013   TSH Recent Labs    09/07/21 1327  TSH 1.750    Allergies   Allergies  Allergen Reactions   Penicillins Shortness Of Breath and Rash    Has patient had a PCN reaction causing immediate rash, facial/tongue/throat swelling, SOB or lightheadedness with hypotension: no Has patient had a PCN reaction causing severe rash involving mucus membranes or skin necrosis: no Has patient had a PCN reaction that required hospitalization no Has patient had a PCN reaction occurring within the last 10 years: no If all of the above answers are "NO", then may proceed with Cephalosporin use.  Other reaction(s): Unknown   Codeine Other (See Comments)    Manic depressive Other reaction(s): Unknown   Voltaren [Diclofenac] Nausea And Vomiting and Other (See Comments)    Pt stated "tore stomach up"   Crestor [Rosuvastatin] Other (See Comments)    Myalgia   Diclofenac Sodium    Vytorin [Ezetimibe-Simvastatin] Other (See Comments)    Severe myalgia   Erythromycin Rash    Other reaction(s): Unknown    Medications Prior to Visit:   Outpatient Medications Prior to Visit  Medication Sig Dispense Refill    buPROPion (WELLBUTRIN XL) 150 MG 24 hr tablet Take by mouth.     carvedilol (COREG) 6.25 MG tablet TAKE 1 TABLET BY MOUTH TWICE DAILY WITH A MEAL 120 tablet 0   EPINEPHrine 0.3 mg/0.3 mL IJ SOAJ injection Inject 0.3 mg into the muscle as needed for anaphylaxis. 1 each 0   ezetimibe (ZETIA) 10 MG tablet Take 1 tablet (10 mg total) by mouth daily. 30 tablet 3   FLUoxetine (PROZAC) 40 MG capsule Take 1 capsule by mouth daily.     levothyroxine (SYNTHROID) 75 MCG tablet Take 75 mcg by mouth daily.     liothyronine (CYTOMEL) 5 MCG tablet Take 5 mcg by mouth daily.     lisinopril (ZESTRIL) 10 MG tablet Take 1 tablet (10 mg total) by mouth daily. 90 tablet 3   LORazepam (ATIVAN) 0.5 MG tablet 1 tablet at bedtime as needed     NON FORMULARY Take 2 capsules by mouth daily. MagniCal-D     NON FORMULARY Take 1 tablet by mouth in the morning and at bedtime. Vinali immune booster     pravastatin (PRAVACHOL) 40 MG tablet Take 1 tablet (40 mg total) by mouth daily. 30 tablet 3   progesterone (PROMETRIUM) 100 MG capsule Take 100 mg by mouth daily.     tiZANidine (ZANAFLEX) 2 MG tablet Take 1 tablet (2 mg total) by mouth every 6 (six) hours as needed for muscle spasms. 30 tablet 11   WHEAT GERM OIL PO Take 1,130 mg by mouth daily.     LORazepam (ATIVAN) 0.5 MG tablet Take 1-2 tablets (0.5-1 mg total) by mouth 3 (three) times daily as needed for anxiety. Prn anxiety 30 tablet 0   topiramate (TOPAMAX) 25 MG tablet Take 25 mg by mouth daily.     FLUoxetine (PROZAC) 40 MG capsule Take 40 mg by mouth daily.     No facility-administered medications prior to visit.   Final Medications at End of Visit    Current Meds  Medication Sig   buPROPion (WELLBUTRIN XL) 150 MG 24 hr tablet Take by mouth.   carvedilol (COREG) 6.25 MG tablet TAKE 1 TABLET BY MOUTH TWICE DAILY WITH A MEAL   EPINEPHrine 0.3 mg/0.3 mL IJ SOAJ injection Inject 0.3 mg into the muscle as needed for anaphylaxis.   ezetimibe (ZETIA) 10 MG tablet  Take 1 tablet (10 mg total) by mouth daily.  FLUoxetine (PROZAC) 40 MG capsule Take 1 capsule by mouth daily.   levothyroxine (SYNTHROID) 75 MCG tablet Take 75 mcg by mouth daily.   liothyronine (CYTOMEL) 5 MCG tablet Take 5 mcg by mouth daily.   lisinopril (ZESTRIL) 10 MG tablet Take 1 tablet (10 mg total) by mouth daily.   LORazepam (ATIVAN) 0.5 MG tablet 1 tablet at bedtime as needed   NON FORMULARY Take 2 capsules by mouth daily. MagniCal-D   NON FORMULARY Take 1 tablet by mouth in the morning and at bedtime. Vinali immune booster   pravastatin (PRAVACHOL) 40 MG tablet Take 1 tablet (40 mg total) by mouth daily.   progesterone (PROMETRIUM) 100 MG capsule Take 100 mg by mouth daily.   tiZANidine (ZANAFLEX) 2 MG tablet Take 1 tablet (2 mg total) by mouth every 6 (six) hours as needed for muscle spasms.   WHEAT GERM OIL PO Take 1,130 mg by mouth daily.   Radiology:  No results found.  Cardiac Studies:   Holter monitor 06/06/2017: Sinus rhythm.  Minimum heart rate 54 bpm, average heart rate 71 bpm, maximum heart rate 102 bpm.  Total ventricular ectopy 3.3%.  Less than 1% supraventricular ectopy  No atrial fibrillation, atrial flutter, ventricular tachycardia.  Echocardiogram 10/04/2020: Left ventricle cavity is normal in size and ventricular wall thickness. Abnormal septal wall motion due to left bundle branch block and moderate global hypokinesis. Cannot exclude apical thrombus. LVEF 35-40%. Grade 1 diastolic function, normal left atrial pressure.  Calculated EF 58%. Left atrial cavity is moderately dilated. Mild (grade 1) mitral regurgitation.  No evidence of pulmonary hypertension. No significant change compared to previous study on 12/30/2019.   Exercise Tetrofosmin stress test 09/21/2021: Exercise nuclear stress test was performed using Bruce protocol. Patient reached 7.6 METS, and 88% of age predicted maximum heart rate. Exercise capacity was low. No chest pain reported. Normal heart  rate response. Resting hypertension 150/100 mmHg, hypertensive response 210/90 mmHg. Stress EKG inconclusive given sinus tachycardia, LBBB.  SPECT stress images show mildly decrease tracer uptake in basal anteroseptal myocardium, which could be due to LBBB.  Paradoxical wall motion and thickening due to LBBB. Stress LVEF 36%. High risk study due to low LVEF. Inconclusive for ischemia evaluation given LBBB. Consider pharmacological stress test or coronary angiography, if clinically indicated.   Echocardiogram 12/06/2021: pending  EKG   12/06/2021: normal sinus rhythm at a rate up 67 bpm. Left axis. LBBB, no further analysis. Compared to EKG 05/23/2021, no significant change.   Assessment     ICD-10-CM   1. Essential hypertension, benign  I10 EKG 12-Lead    2. LBBB (left bundle branch block)  I44.7     3. Exertional chest pain  R07.9     No orders of the defined types were placed in this encounter.  No orders of the defined types were placed in this encounter.   Recommendations:   Lindsey Rodriguez  is a 56 y.o. Caucasian female, with frequent PVCs and  new onset left bundle branch block and non ischemic cardiomyopathy diagnosed in 2017 with severe LV systolic dysfunction, EF has improved with medical therapy to around 40 to 45%.  She has hypertension and hyperlipidemia.  She has also been diagnosed with IgG lambda MGUS, small fiber neuropathy, anxiety and depression, panic attacks and severe degenerative L5S1 spine disease and also fibromyalgia. She underwent L4-S1 fusion in January 2022.   Patient has been unable to tolerate Entresto due to fatigue and shortness of breath, she therefore switched  back to lisinopril which she previously tolerated without issue.  Patient has not tolerated pravastatin and Zetia, LDL is well controlled.  Blood pressure is well controlled.  Continue present medications.  Reviewed and discussed results of stress test, details above.  Results of echocardiogram  are pending.  Patient has had no recurrence of chest pain since last office visit, however she has been unable to exert herself due to recent ankle injury.  Further recommendations pending results of echocardiogram.  Additional evaluation could be considered with Lexiscan stress test, CT, or cardiac cath.  Could also consider watchful waiting if echocardiogram is unremarkable.  Follow-up in 3 months, sooner if needed.   Alethia Berthold, PA-C 12/06/2021, 2:54 PM Office: [phone]

## 2021-12-09 DIAGNOSIS — Z6829 Body mass index (BMI) 29.0-29.9, adult: Secondary | ICD-10-CM | POA: Diagnosis not present

## 2021-12-09 DIAGNOSIS — M2550 Pain in unspecified joint: Secondary | ICD-10-CM | POA: Diagnosis not present

## 2021-12-20 NOTE — Progress Notes (Signed)
Called and spoke to patient she is aware of results

## 2021-12-23 DIAGNOSIS — N951 Menopausal and female climacteric states: Secondary | ICD-10-CM | POA: Diagnosis not present

## 2021-12-23 DIAGNOSIS — Z6830 Body mass index (BMI) 30.0-30.9, adult: Secondary | ICD-10-CM | POA: Diagnosis not present

## 2021-12-23 DIAGNOSIS — E038 Other specified hypothyroidism: Secondary | ICD-10-CM | POA: Diagnosis not present

## 2021-12-23 DIAGNOSIS — E559 Vitamin D deficiency, unspecified: Secondary | ICD-10-CM | POA: Diagnosis not present

## 2021-12-29 DIAGNOSIS — R5383 Other fatigue: Secondary | ICD-10-CM | POA: Diagnosis not present

## 2021-12-29 DIAGNOSIS — Z6830 Body mass index (BMI) 30.0-30.9, adult: Secondary | ICD-10-CM | POA: Diagnosis not present

## 2021-12-29 DIAGNOSIS — N898 Other specified noninflammatory disorders of vagina: Secondary | ICD-10-CM | POA: Diagnosis not present

## 2021-12-29 DIAGNOSIS — N951 Menopausal and female climacteric states: Secondary | ICD-10-CM | POA: Diagnosis not present

## 2022-01-10 DIAGNOSIS — E559 Vitamin D deficiency, unspecified: Secondary | ICD-10-CM | POA: Diagnosis not present

## 2022-01-10 DIAGNOSIS — Z6830 Body mass index (BMI) 30.0-30.9, adult: Secondary | ICD-10-CM | POA: Diagnosis not present

## 2022-01-20 DIAGNOSIS — Z6830 Body mass index (BMI) 30.0-30.9, adult: Secondary | ICD-10-CM | POA: Diagnosis not present

## 2022-01-20 DIAGNOSIS — M2550 Pain in unspecified joint: Secondary | ICD-10-CM | POA: Diagnosis not present

## 2022-01-27 ENCOUNTER — Ambulatory Visit: Payer: Medicare PPO | Admitting: Student

## 2022-02-15 ENCOUNTER — Ambulatory Visit: Payer: Medicare PPO | Admitting: Family Medicine

## 2022-02-15 ENCOUNTER — Encounter: Payer: Self-pay | Admitting: Family Medicine

## 2022-02-15 VITALS — BP 138/78 | HR 64 | Temp 98.1°F | Ht 66.0 in | Wt 192.4 lb

## 2022-02-15 DIAGNOSIS — N898 Other specified noninflammatory disorders of vagina: Secondary | ICD-10-CM | POA: Diagnosis not present

## 2022-02-15 DIAGNOSIS — A5901 Trichomonal vulvovaginitis: Secondary | ICD-10-CM

## 2022-02-15 DIAGNOSIS — B3731 Acute candidiasis of vulva and vagina: Secondary | ICD-10-CM

## 2022-02-15 DIAGNOSIS — R3915 Urgency of urination: Secondary | ICD-10-CM | POA: Diagnosis not present

## 2022-02-15 DIAGNOSIS — N949 Unspecified condition associated with female genital organs and menstrual cycle: Secondary | ICD-10-CM

## 2022-02-15 LAB — POCT URINALYSIS DIP (PROADVANTAGE DEVICE)
Bilirubin, UA: NEGATIVE
Blood, UA: NEGATIVE
Glucose, UA: NEGATIVE mg/dL
Ketones, POC UA: NEGATIVE mg/dL
Leukocytes, UA: NEGATIVE
Nitrite, UA: NEGATIVE
Protein Ur, POC: NEGATIVE mg/dL
Specific Gravity, Urine: 1.01
Urobilinogen, Ur: NEGATIVE
pH, UA: 6.5 (ref 5.0–8.0)

## 2022-02-15 LAB — POCT WET PREP (WET MOUNT)
Clue Cells Wet Prep Whiff POC: NEGATIVE
Trichomonas Wet Prep HPF POC: ABSENT

## 2022-02-15 NOTE — Progress Notes (Signed)
Chief Complaint  Patient presents with   Vaginal Itching    Vaginal itching, burning. No discharge or odor. Did a 3 day Monistat suppository Sat,Sun and Mon without any relief. Increased intercourse over the last month. Not having any real urinary symptoms.    End of last week she noted vaginal burning, itching. Started internally (vaginal), and now spread to labia.  She denied any significant vaginal discharge (just small amount of thin, pale yellow discharge, slightly more than normal). No thick/white discharge. She used Monistat Sat/Sun/Mon (3 day), and continued to use the external cream. It helps temporarily.  Burning and itching has become more intense.   She reports having intercourse more frequently recently. Bruise on R side from sex over the tailgate of the truck. Having intercourse 1-2x/d for the last month or two. She reports increased libido. She denies any change in sleep, spending, other potential manic/hypomanic symptoms.  Not on med list, and not mentioned during 2 reviews of medications, was that she has been getting testosterone pellets from [facility].  Dose lowered from initial dose, per pt. Last pellet was about a month ago.  Uses coconut oil as a lubricant.  Has used this in the past without problems. Same brand, no changes.  She notes some urinary urgency.  Denies dysuria, hematuria, no abdominal or pelvic pain.   PMH, PSH, SH reviewed  Outpatient Encounter Medications as of 02/15/2022  Medication Sig Note   buPROPion (WELLBUTRIN XL) 150 MG 24 hr tablet Take by mouth.    carvedilol (COREG) 6.25 MG tablet TAKE 1 TABLET BY MOUTH TWICE DAILY WITH A MEAL    FLUoxetine (PROZAC) 40 MG capsule Take 2 capsules by mouth daily.    lisinopril (ZESTRIL) 10 MG tablet Take 1 tablet (10 mg total) by mouth daily.    [DISCONTINUED] levothyroxine (SYNTHROID) 75 MCG tablet Take 75 mcg by mouth daily.    EPINEPHrine 0.3 mg/0.3 mL IJ SOAJ injection Inject 0.3 mg into the muscle as  needed for anaphylaxis. (Patient not taking: Reported on 02/15/2022)    LORazepam (ATIVAN) 0.5 MG tablet Take 1-2 tablets (0.5-1 mg total) by mouth 3 (three) times daily as needed for anxiety. Prn anxiety (Patient not taking: Reported on 02/15/2022)    LORazepam (ATIVAN) 0.5 MG tablet 1 tablet at bedtime as needed (Patient not taking: Reported on 02/15/2022) 02/15/2022: Prn, has not had any in the last 2 weeks   tiZANidine (ZANAFLEX) 2 MG tablet Take 1 tablet (2 mg total) by mouth every 6 (six) hours as needed for muscle spasms. (Patient not taking: Reported on 02/15/2022) 02/15/2022: prn   [DISCONTINUED] ezetimibe (ZETIA) 10 MG tablet Take 1 tablet (10 mg total) by mouth daily.    [DISCONTINUED] liothyronine (CYTOMEL) 5 MCG tablet Take 5 mcg by mouth daily.    [DISCONTINUED] NON FORMULARY Take 2 capsules by mouth daily. MagniCal-D (Patient not taking: Reported on 02/15/2022)    [DISCONTINUED] NON FORMULARY Take 1 tablet by mouth in the morning and at bedtime. Vinali immune booster    [DISCONTINUED] pravastatin (PRAVACHOL) 40 MG tablet Take 1 tablet (40 mg total) by mouth daily. (Patient not taking: Reported on 02/15/2022)    [DISCONTINUED] progesterone (PROMETRIUM) 100 MG capsule Take 100 mg by mouth daily. (Patient not taking: Reported on 02/15/2022)    [DISCONTINUED] topiramate (TOPAMAX) 25 MG tablet Take 25 mg by mouth daily. (Patient not taking: Reported on 02/15/2022)    [DISCONTINUED] WHEAT GERM OIL PO Take 1,130 mg by mouth daily.    No facility-administered  encounter medications on file as of 02/15/2022.    Allergies  Allergen Reactions   Penicillins Shortness Of Breath and Rash    Has patient had a PCN reaction causing immediate rash, facial/tongue/throat swelling, SOB or lightheadedness with hypotension: no Has patient had a PCN reaction causing severe rash involving mucus membranes or skin necrosis: no Has patient had a PCN reaction that required hospitalization no Has patient had a PCN  reaction occurring within the last 10 years: no If all of the above answers are "NO", then may proceed with Cephalosporin use.  Other reaction(s): Unknown   Codeine Other (See Comments)    Manic depressive Other reaction(s): Unknown   Voltaren [Diclofenac] Nausea And Vomiting and Other (See Comments)    Pt stated "tore stomach up"   Crestor [Rosuvastatin] Other (See Comments)    Myalgia   Diclofenac Sodium    Vytorin [Ezetimibe-Simvastatin] Other (See Comments)    Severe myalgia   Erythromycin Rash    Other reaction(s): Unknown    ROS: No chest pain, SOB No f/c, n/v/d, URI symptoms, bleeding, bruising, rash. Some sciatica Vaginal burning and itching per HPI. Moods are good. She remains under the care of [name] Denies any abuse, all sex is consensual.    PHYSICAL EXAM:  BP 138/78   Pulse 64   Temp 98.1 F (36.7 C) (Tympanic)   Ht '5\' 6"'$  (1.676 m)   Wt 192 lb 6.4 oz (87.3 kg)   LMP 12/16/2012   BMI 31.05 kg/m    Wt Readings from Last 3 Encounters:  02/15/22 192 lb 6.4 oz (87.3 kg)  12/06/21 184 lb 6.4 oz (83.6 kg)  10/25/21 185 lb 6.4 oz (84.1 kg)   Well-appearing ,pleasant female, in good spirits. She is alert and oriented HEENT conjunctiva and sclera are clear, EOMI Neck: no lymphadenopathy, thyromegaly or mass Back: no CVA tenderness Abdomen: nontender, no mass GU: normal external genitalia without rashes, erythema, lesion. Speculum exam--Thin white discharge, small amount, present.  No odor.  Uterus is absent. No adnexal tenderness, no masses Bruise at R buttock/posterior perineum  Urine dip is normal. Negative KOH. Wet prep unremarkable, some squam, no bacteria or clue cells. NuSwab also sent.   ASSESSMENT/PLAN:  Vaginal itching - Ddx reviewed--no e/o yeast, but sending swab for confirmation (recently completed treatment course). No h/o new contacts contributing - Plan: POCT Urinalysis DIP (Proadvantage Device), POCT Wet Prep (Wet [place]), NuSwab  Vaginitis Plus (VG+)  Vaginal burning - r/o infection.  Much more frequent intercourse.  May want to back off for a bit. May try OTC vagisil for irritation if not improving - Plan: POCT Urinalysis DIP (Proadvantage Device), NuSwab Vaginitis Plus (VG+)  Urinary urgency - no e/o infection  Increased libido due to testosterone pellets.  Briefly discussed poss hypomania (until I learned that she was getting testosterone treatment). If bothersome, can d/w [facility] regarding her dose.

## 2022-02-15 NOTE — Patient Instructions (Signed)
I do not see any evidence of infection. We are sending out a swab which will also look for different types of infections. I didn't see any abnormal discharge to suggest infection.

## 2022-02-18 LAB — NUSWAB VAGINITIS PLUS (VG+)
Candida albicans, NAA: POSITIVE — AB
Candida glabrata, NAA: NEGATIVE
Chlamydia trachomatis, NAA: NEGATIVE
Neisseria gonorrhoeae, NAA: NEGATIVE
Trich vag by NAA: POSITIVE — AB

## 2022-02-18 MED ORDER — FLUCONAZOLE 150 MG PO TABS: 150.0000 mg | ORAL_TABLET | Freq: Once | ORAL | 0 refills | Status: AC

## 2022-02-18 MED ORDER — METRONIDAZOLE 500 MG PO TABS: 500.0000 mg | ORAL_TABLET | Freq: Two times a day (BID) | ORAL | 0 refills | Status: AC

## 2022-02-18 NOTE — Addendum Note (Signed)
Addended byRita Ohara on: 02/18/2022 10:52 PM   Modules accepted: Orders

## 2022-03-01 ENCOUNTER — Other Ambulatory Visit: Payer: Self-pay | Admitting: Cardiology

## 2022-03-01 ENCOUNTER — Encounter: Payer: Self-pay | Admitting: Cardiology

## 2022-03-01 DIAGNOSIS — I10 Essential (primary) hypertension: Secondary | ICD-10-CM

## 2022-03-01 MED ORDER — CARVEDILOL 6.25 MG PO TABS: 6.2500 mg | ORAL_TABLET | Freq: Two times a day (BID) | ORAL | 3 refills | Status: DC

## 2022-03-01 NOTE — Telephone Encounter (Signed)
From patient.

## 2022-03-08 ENCOUNTER — Encounter: Payer: Self-pay | Admitting: Internal Medicine

## 2022-03-15 ENCOUNTER — Ambulatory Visit: Payer: Medicare PPO | Admitting: Student

## 2022-04-04 ENCOUNTER — Other Ambulatory Visit: Payer: Self-pay | Admitting: *Deleted

## 2022-04-04 DIAGNOSIS — D472 Monoclonal gammopathy: Secondary | ICD-10-CM

## 2022-04-05 ENCOUNTER — Inpatient Hospital Stay: Payer: Medicare PPO

## 2022-04-06 ENCOUNTER — Inpatient Hospital Stay: Payer: Medicare PPO | Attending: Hematology

## 2022-04-06 ENCOUNTER — Other Ambulatory Visit: Payer: Self-pay

## 2022-04-06 DIAGNOSIS — F419 Anxiety disorder, unspecified: Secondary | ICD-10-CM | POA: Diagnosis not present

## 2022-04-06 DIAGNOSIS — I10 Essential (primary) hypertension: Secondary | ICD-10-CM | POA: Insufficient documentation

## 2022-04-06 DIAGNOSIS — M5410 Radiculopathy, site unspecified: Secondary | ICD-10-CM | POA: Insufficient documentation

## 2022-04-06 DIAGNOSIS — Z87891 Personal history of nicotine dependence: Secondary | ICD-10-CM | POA: Insufficient documentation

## 2022-04-06 DIAGNOSIS — M797 Fibromyalgia: Secondary | ICD-10-CM | POA: Diagnosis not present

## 2022-04-06 DIAGNOSIS — D472 Monoclonal gammopathy: Secondary | ICD-10-CM | POA: Insufficient documentation

## 2022-04-06 LAB — CBC WITH DIFFERENTIAL (CANCER CENTER ONLY)
Abs Immature Granulocytes: 0.01 10*3/uL (ref 0.00–0.07)
Basophils Absolute: 0 10*3/uL (ref 0.0–0.1)
Basophils Relative: 1 %
Eosinophils Absolute: 0 10*3/uL (ref 0.0–0.5)
Eosinophils Relative: 1 %
HCT: 38.5 % (ref 36.0–46.0)
Hemoglobin: 13.5 g/dL (ref 12.0–15.0)
Immature Granulocytes: 0 %
Lymphocytes Relative: 49 %
Lymphs Abs: 2 10*3/uL (ref 0.7–4.0)
MCH: 34.6 pg — ABNORMAL HIGH (ref 26.0–34.0)
MCHC: 35.1 g/dL (ref 30.0–36.0)
MCV: 98.7 fL (ref 80.0–100.0)
Monocytes Absolute: 0.4 10*3/uL (ref 0.1–1.0)
Monocytes Relative: 10 %
Neutro Abs: 1.6 10*3/uL — ABNORMAL LOW (ref 1.7–7.7)
Neutrophils Relative %: 39 %
Platelet Count: 202 10*3/uL (ref 150–400)
RBC: 3.9 MIL/uL (ref 3.87–5.11)
RDW: 12.6 % (ref 11.5–15.5)
WBC Count: 4.1 10*3/uL (ref 4.0–10.5)
nRBC: 0 % (ref 0.0–0.2)

## 2022-04-06 LAB — CMP (CANCER CENTER ONLY)
ALT: 42 U/L (ref 0–44)
AST: 32 U/L (ref 15–41)
Albumin: 4.7 g/dL (ref 3.5–5.0)
Alkaline Phosphatase: 65 U/L (ref 38–126)
Anion gap: 4 — ABNORMAL LOW (ref 5–15)
BUN: 12 mg/dL (ref 6–20)
CO2: 28 mmol/L (ref 22–32)
Calcium: 9.5 mg/dL (ref 8.9–10.3)
Chloride: 105 mmol/L (ref 98–111)
Creatinine: 0.79 mg/dL (ref 0.44–1.00)
GFR, Estimated: >60 mL/min (ref >60)
Glucose, Bld: 108 mg/dL — ABNORMAL HIGH (ref 70–99)
Potassium: 4.4 mmol/L (ref 3.5–5.1)
Sodium: 137 mmol/L (ref 135–145)
Total Bilirubin: 0.4 mg/dL (ref 0.3–1.2)
Total Protein: 8.2 g/dL — ABNORMAL HIGH (ref 6.5–8.1)

## 2022-04-11 ENCOUNTER — Encounter: Payer: Self-pay | Admitting: Internal Medicine

## 2022-04-11 LAB — MULTIPLE MYELOMA PANEL, SERUM
Albumin SerPl Elph-Mcnc: 4 g/dL (ref 2.9–4.4)
Albumin/Glob SerPl: 1.3 (ref 0.7–1.7)
Alpha 1: 0.1 g/dL (ref 0.0–0.4)
Alpha2 Glob SerPl Elph-Mcnc: 0.7 g/dL (ref 0.4–1.0)
B-Globulin SerPl Elph-Mcnc: 1 g/dL (ref 0.7–1.3)
Gamma Glob SerPl Elph-Mcnc: 1.4 g/dL (ref 0.4–1.8)
Globulin, Total: 3.3 g/dL (ref 2.2–3.9)
IgA: 68 mg/dL — ABNORMAL LOW (ref 87–352)
IgG (Immunoglobin G), Serum: 1557 mg/dL (ref 586–1602)
IgM (Immunoglobulin M), Srm: 50 mg/dL (ref 26–217)
M Protein SerPl Elph-Mcnc: 1.1 g/dL — ABNORMAL HIGH
Total Protein ELP: 7.3 g/dL (ref 6.0–8.5)

## 2022-04-12 ENCOUNTER — Inpatient Hospital Stay (HOSPITAL_BASED_OUTPATIENT_CLINIC_OR_DEPARTMENT_OTHER): Payer: Medicare PPO | Admitting: Hematology

## 2022-04-12 VITALS — BP 138/86 | HR 87 | Temp 98.6°F | Resp 17 | Ht 66.0 in | Wt 194.0 lb

## 2022-04-12 DIAGNOSIS — D472 Monoclonal gammopathy: Secondary | ICD-10-CM | POA: Diagnosis not present

## 2022-04-12 NOTE — Progress Notes (Signed)
HEMATOLOGY ONCOLOGY PROGRESS NOTE  Date of service: 04/12/22  Patient Care Team: Rita Ohara, MD as PCP - General (Family Medicine) Pieter Partridge, DO as Consulting Physician (Neurology) Juanita Craver, MD as Consulting Physician (Gastroenterology) Adrian Prows, MD as Consulting Physician (Cardiology) Brunetta Genera, MD as Consulting Physician (Hematology) Starling Manns, MD (Orthopedic Surgery) Zonia Kief, MD (Rehabilitation) Erline Levine, MD as Consulting Physician (Neurosurgery) Blanche East, MD as Consulting Physician (Neurosurgery) Eunice Blase, MD (Family Medicine) Mcarthur Rossetti, MD as Consulting Physician (Orthopedic Surgery) Jari Pigg, MD as Consulting Physician (Dermatology) Puschinsky, Fransico Him., MD (General Surgery) Chucky May, MD as Consulting Physician (Psychiatry) Cleaster Corin, OD (Optometry) Gabriela Eves, DMD (Dentistry) Dr Adrian Prows MD (cardiology) Dr Erline Levine - neurosurgery Dr Bo Merino MD -rheumatology  Diagnosis: #1 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 Skeletal survey negative. Bone marrow deferred as per patient choice.  Current Treatment: observation  INTERVAL HISTORY: Lindsey Rodriguez is here for her scheduled 6 month follow-up for IgG lambda MGUS. The patient's last visit with [place] was on 04/12/2021 and she was doing well.   She reports that her neuropathy is progressing. She reports that it is mostly numbness but no pain.   She denies any shortness of breath or any changes to breathing. She denies abdominal pain and leg swelling.   She complains of left medial elbow pain that started in February of 2023, and reports that her PCP has not been able to figure out the cause of the pain.    REVIEW OF SYSTEMS:   .10 Point review of Systems was done is negative except as noted above.   Past Medical History:  Diagnosis Date   Anemia    Anxiety panic attacks   [name]    Arthritis    knees   Cardiomyopathy, nonischemic ([place]) 04/07/2016   EF left ventricule calculated on Lexiscan stress test at 33% with global hypokinesis.    Chronic back pain    Chronic neck pain    COVID-19 06/2021   treated with Paxlovid through [facility] in [place]    pvc's with cardiomyopathy   Family history of adverse reaction to anesthesia    mother has problems waking up   Fibromyalgia    and neuropathy of feet   History of kidney stones    lithotripsy  03/2016   Hyperlipidemia    Hypertension    Kidney stone    History   Kidney stones h/o   Melanoma ([place]) RUE   [name]   MGUS (monoclonal gammopathy of unknown significance) 06/2015   no treatment just watching recheck blood work 12/2015   Migraine    OTC med prn - stress related   Plantar fasciitis right   Renal stones 2008/2011/2017   Seasonal allergies    SVD (spontaneous vaginal delivery)    x 3   Tendonitis of foot right    Past Surgical History:  Procedure Laterality Date   ABLATION ON ENDOMETRIOSIS N/A 11/24/2015   Procedure: ABLATION ON ENDOMETRIOSIS;  Surgeon: [name], MD;  Location: [facility];  Service: Gynecology;  Laterality: N/A;   ANTERIOR LAT LUMBAR FUSION Right 06/15/2016   Procedure: RIGHT LUMBAR THREE-FOUR ANTEROLATERAL LUMBAR INTERBODY FUSION WITH LATERAL PLATE;  Surgeon: Erline Levine, MD;  Location: [facility];  Service: Neurosurgery;  Laterality: Right;   BACK SURGERY  Dr.Stern, last sx Summer 2011   X 4  (L3-S1)   BACK SURGERY  06/2016   BACK SURGERY  07/20/2020   L2-3 lateral interbody (Left side approach), L2-3 Posterior Spinal Fusion at [facility] 11/24/2015   Procedure: TRANSVAGINAL TAPE (TVT) PROCEDURE;  Surgeon: [name], MD;  Location: [facility];  Service: Gynecology;  Laterality: N/A;   C2-3 nerve ablation Right 06/2019   Dr. Maia Petties (per pt)   COLONOSCOPY     CYSTOCELE REPAIR  11/24/2015   Procedure: ANTERIOR REPAIR  (CYSTOCELE);  Surgeon: [name], MD;  Location: [facility];  Service: Gynecology;;   CYSTOSCOPY N/A 11/24/2015   Procedure: Consuela Mimes;  Surgeon: [name], MD;  Location: [facility];  Service: Gynecology;  Laterality: N/A;   DILATION AND CURETTAGE OF UTERUS     ENDOMETRIAL ABLATION  08/2000   EYE SURGERY Bilateral    Lasik    KIDNEY STONE SURGERY  2011   LITHOTRIPSY  08/2017 (L), 03/2018 (R)   MELANOMA EXCISION  05/2015   right forearm; [name]   NECK SURGERY  [name]  Summer 2011   X 4  (C4-T1 Level)   RHINOPLASTY     ROBOTIC ASSISTED BILATERAL SALPINGO OOPHERECTOMY Bilateral 11/24/2015   Procedure: ROBOTIC ASSISTED BILATERAL SALPINGO OOPHORECTOMY;  Surgeon: [name], MD;  Location: [facility];  Service: Gynecology;  Laterality: Bilateral;--OVARIES NOT REMOVED PER PATH REPORT   ROBOTIC ASSISTED TOTAL HYSTERECTOMY WITH SALPINGECTOMY Bilateral 11/24/2015   Procedure: ROBOTIC ASSISTED TOTAL HYSTERECTOMY WITH SALPINGECTOMY;  Surgeon: [name], MD;  Location: [facility];  Service: Gynecology;  Laterality: Bilateral;   TONSILLECTOMY     TOTAL VAGINAL HYSTERECTOMY     WISDOM TOOTH EXTRACTION      Social History   Tobacco Use   Smoking status: Former    Packs/day: 0.10    Years: 3.00    Total pack years: 0.30    Types: Cigarettes    Quit date: 07/04/1987    Years since quitting: 34.7   Smokeless tobacco: Never  Vaping Use   Vaping Use: Never used  Substance Use Topics   Alcohol use: Yes    Comment: 2/week   Drug use: No   ALLERGIES:  is allergic to penicillins, codeine, voltaren [diclofenac], crestor [rosuvastatin], diclofenac sodium, vytorin [ezetimibe-simvastatin], and erythromycin.  MEDICATIONS:  Current Outpatient Medications  Medication Sig Dispense Refill   buPROPion (WELLBUTRIN XL) 150 MG 24 hr tablet Take by mouth.     carvedilol (COREG) 6.25 MG tablet Take 1 tablet (6.25 mg total) by mouth 2 (two) times daily with a meal. 180 tablet 3   EPINEPHrine 0.3  mg/0.3 mL IJ SOAJ injection Inject 0.3 mg into the muscle as needed for anaphylaxis. (Patient not taking: Reported on 02/15/2022) 1 each 0   FLUoxetine (PROZAC) 40 MG capsule Take 2 capsules by mouth daily.     lisinopril (ZESTRIL) 10 MG tablet Take 1 tablet (10 mg total) by mouth daily. 90 tablet 3   LORazepam (ATIVAN) 0.5 MG tablet Take 1-2 tablets (0.5-1 mg total) by mouth 3 (three) times daily as needed for anxiety. Prn anxiety (Patient not taking: Reported on 02/15/2022) 30 tablet 0   LORazepam (ATIVAN) 0.5 MG tablet 1 tablet at bedtime as needed (Patient not taking: Reported on 02/15/2022)     tiZANidine (ZANAFLEX) 2 MG tablet Take 1 tablet (2 mg total) by mouth every 6 (six) hours as needed for muscle spasms. (Patient not taking: Reported on 02/15/2022) 30 tablet 11   No current facility-administered medications for this visit.    PHYSICAL EXAMINATION: ECOG PERFORMANCE STATUS: 2  Vitals:   04/12/22 0904  BP: 138/86  Pulse: 87  Resp: 17  Temp: 98.6 F (37 C)  SpO2: 100%     Filed Weights   04/12/22 0904  Weight: 194 lb (88 kg)    .Body mass index is 31.31 kg/m.  Marland Kitchen GENERAL:alert, in no acute distress and comfortable SKIN: no acute rashes, no significant lesions EYES: conjunctiva are pink and non-injected, sclera anicteric OROPHARYNX: MMM, no exudates, no oropharyngeal erythema or ulceration NECK: supple, no JVD LYMPH:  no palpable lymphadenopathy in the cervical, axillary or inguinal regions LUNGS: clear to auscultation b/l with normal respiratory effort HEART: regular rate & rhythm ABDOMEN:  normoactive bowel sounds , non tender, not distended. Extremity: no pedal edema PSYCH: alert & oriented x 3 with fluent speech NEURO: no focal motor/sensory deficits   LABORATORY DATA:   I have reviewed the data as listed  .    Latest Ref Rng & Units 04/06/2022    9:35 AM 09/07/2021   12:20 PM 03/08/2021   10:12 AM  CBC  WBC 4.0 - 10.5 K/uL 4.1  3.3  4.6   Hemoglobin 12.0 -  15.0 g/dL 13.5  12.8  13.0   Hematocrit 36.0 - 46.0 % 38.5  38.8  38.8   Platelets 150 - 400 K/uL 202  237  284     .    Latest Ref Rng & Units 04/06/2022    8:07 AM 10/25/2021    8:14 AM 09/07/2021   12:20 PM  CMP  Glucose 70 - 99 mg/dL 108  89  101   BUN 6 - 20 mg/dL '12  10  17   ' Creatinine 0.44 - 1.00 mg/dL 0.79  0.83  0.75   Sodium 135 - 145 mmol/L 137  136  140   Potassium 3.5 - 5.1 mmol/L 4.4  5.3  4.9   Chloride 98 - 111 mmol/L 105  100  101   CO2 22 - 32 mmol/L '28  21  24   ' Calcium 8.9 - 10.3 mg/dL 9.5  9.9  10.2   Total Protein 6.5 - 8.1 g/dL 8.2  7.6  8.1   Total Bilirubin 0.3 - 1.2 mg/dL 0.4  0.4  0.4   Alkaline Phos 38 - 126 U/L 65  74  81   AST 15 - 41 U/L 32  33  54   ALT 0 - 44 U/L 42  43  70         RADIOGRAPHIC STUDIES: I have personally reviewed the radiological images as listed and agreed with the findings in the report. No results found. Echocardiogram 12/06/2021:  Left ventricle cavity is normal in size. Abnormal septal wall motion due  to left bundle branch block. Moderate global hypokinesis. LVEF 35-40%.  Indeterminate diastolic filling pattern.  Left atrial cavity is normal in size.  Mild tricuspid regurgitation.  No evidence of pulmonary hypertension.  Previous study on 10/04/2020 reported mod LA dilatation, grade 1 diastolic  dysfunction. No other significant change noted.  ASSESSMENT & PLAN:   56 y.o. 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  Previous UPEP and skeletal survey negative. Patient has no evidence of anemia, renal failure on labs. No hypercalcemia. No new bone pain. SPEP shows stable M-spike at 1g/dl with no significant increase over the last couple of follow-ups. SFLC ratio within normal limits -12/21/18 MRI Cervical spine which  did not reveal any overt indication/concern for bone  tumors. Continue follow up with Orthopedics and neurology. 2) Systolic nonischemic CHF patient previously noted improvement in her EF from 33% to 45%. She subsequently noted her EF normalized to 55% as of 06/19/18 clinic visit.  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 4) Peripheral radiculopathy /neuropathy -Is currently followed by Vertell Limber of Neurosurgery as well as Dr Tomi Likens of Neurology who have begun to work this up further. -Most recent electromyography showed evidence of radiculopathy, mild in degree, and without evidence of sensorimotor polyneuropathy.   Cannot r/o small fiber neuropathy -  Evidence of this on epidermal nerve fiber density analysis.  PLAN: -Discussed her labs results from today, 04/12/2022 were reviewed with the patient. CBC within normal limits, CMP within normal limits.  Myeloma panel M spike stable @ 1.1g/dl -Pt still does not currently fit the CRAB criteria: no hypercalcemia, no renal insufficiency, no anemia.  No overt evidence of progression to multiple myeloma. -Will have a phone visit in 6 months.  Continue followup with primary care physician, rheumatology, cardiology, and neurology.    FOLLOW UP: -Will have a phone visit in 6 months.  The total time spent in the appointment was 20 minutes* .  All of the patient's questions were answered with apparent satisfaction. The patient knows to call the clinic with any problems, questions or concerns.   I,Param Shah,acting as a Education administrator for Lindsey Lone, MD.,have documented all relevant documentation on the behalf of Lindsey Lone, MD,as directed by  Lindsey Lone, MD while in the presence of Lindsey Lone, MD.  Lindsey Lone MD [place] AAHIVMS [facility] Hematology/Oncology Physician [facility]  .*Total Encounter Time as defined by the Centers for Medicare and Medicaid Services  includes, in addition to the face-to-face time of a patient visit (documented in the note above) non-face-to-face time: obtaining and reviewing outside history, ordering and reviewing medications, tests or procedures, care coordination (communications with other health care professionals or caregivers) and documentation in the medical record.

## 2022-04-25 NOTE — Progress Notes (Unsigned)
No chief complaint on file.   Lindsey Rodriguez is a 56 y.o. female who presents for annual physical exam, Medicare wellness visit and follow-up on chronic medical conditions.    She was last seen here in August with vaginal discharge, and was found to have trich. Currently denies any vaginal discharge or complaints. She is getting testosterone pellets from [facility], which has helped her libido. Last pellet insertion was earlier this month. ?adjusted?  Cholelithiasis was noted on 03/2021 US done for flank pain.  She had normal CBD, poss hepatic steatosis.  Bilateral nephrolithiasis was noted on CT scan. She denies any flank pain, hematuria, RUQ pain.  Hyperlipidemia: This is managed by cardiologist. She was unable to tolerate Vytorin, lipitor and Crestor in the past (myalgias).  She is doing well on pravastatin and zetia.  She continues to eat red meat 3-4 times/week, uses ground [place] a lot. Some burgers. +butter. Uses coconut oil. 3-4 eggs/week.   Lab Results  Component Value Date   CHOL 166 10/25/2021   HDL 46 10/25/2021   LDLCALC 86 10/25/2021   TRIG 200 (H) 10/25/2021   CHOLHDL 3.2 04/05/2020     [name] and nonischemic cardiomyopathy, under the care of Dr. Einar Gip and his APPs.  Last saw PA in 12/2021, scheduled for December.  She remains on remains on lisinopril and carvedilol. She had been out of the carvedilol for a month, and noted increase in chest tightness and irregular rhythm.  This resolved when med was refilled. No longer having dizzy spells or shortness of breath.  BPs are running  ***  Last echocardiogram was 12/06/2021:  Left ventricle cavity is normal in size. Abnormal septal wall motion due to left BBB. Moderate global hypokinesis. LVEF 35-40%.  Indeterminate diastolic filling pattern.  Left atrial cavity is normal in size.  Mild tricuspid regurgitation.  No evidence of pulmonary hypertension.  She had inconclusive stress test in 08/2021.     She sees Dr. Tomi Likens for her headaches, last seen in 10/2021. She has migraines without aura, occipital neuralgia/cervicogenic headaches, and tension-type HA's. Also has idiopathic small fiber neuropathy.  She had upper cervical nerve root ablation around December 2020, which had helped some. She is using Tizanidine prn for her headaches, infrequently.  Chronic back pain:  She had L2-3 lateral interbody (Left side approach), L2-3 Posterior Spinal Fusion in January 2022 through [facility] (Dr. Dava Najjar). She still has pain, doing down RLE, with neuropathy in her feet. Had R SI injection 10/2020 without benefit. She had CT myelograms of thoracic and lumbar spine in June 2022.  Last seen at [facility] 01/2021, reported slowly improving from surgery.   ?if still seeing other  neurosurgeons, no notes received UPDATE  Last year-- Currently she has some pain down her left leg (the right side resolved), more of a weakness than true pain.  She has neuropathy in both feet.  Currently pain is 3/10, up to 6-7 when up and moving.  Ice and heat help, medications don't. Recalls being on cymbalta in the past (but no details about if it helped, why changed). Didn't tolerate lyrica and gabapentin due to sedation.  She continues to see [name] for depression and anxiety. Currently is on $Rem'40mg'fjtv$  of prozac and Wellbutrin XL $RemoveBefor'150mg'EUZzjsyEteuP$  daily.    IgG lambda MGUS--saw Dr. Irene Limbo recently and has been stable. She was told to f/u in 1 year.  She had been under the care of Dr. Estanislado Pandy for chondrocalcinosis, OA, DJD, and fibromyalgia.  She wasn't on medicatons from their office, no longer follows there.    Immunization History  Administered Date(s) Administered   Hepatitis B 10/31/2008   Influenza Split 03/18/2011, 04/02/2014, 05/01/2014   Influenza Whole 07/03/2009   Influenza, Seasonal, Injecte, Preservative Fre 06/04/2012   Influenza,inj,Quad PF,6+ Mos 03/10/2013, 04/07/2015, 03/02/2016, 03/08/2017, 03/13/2018, 04/07/2019, 04/05/2020,  04/14/2021   Influenza-Unspecified 05/03/2012, 04/07/2015, 04/21/2020   MMR 07/03/1981   Pneumococcal Conjugate-13 03/08/2017   Tdap 07/03/1981, 09/05/2012   She refuses COVID vaccines.  She states a family member died from the vaccine, and daughter has heart failure (had vaccines, and COVID) Had COVID 06/2021 Last Pap smear: per Dr. Philis Pique, approx 10/2014; s/p hysterectomy (for benign reasons) Last mammogram: 07/2021 Last colonoscopy: 12/2017, normal (previously had polyp); 5 yr f/u rec per Dr. Collene Mares Last DEXA: 06/2019, T-1.4 at L wrist Dentist: twice yearly Ophtho: yearly Exercise:  Tries to walk at least 20 minutes 4x/week. No longer sees trainer.  Sporadic weight-bearing exercise.  Vitamin D last checked in 07/2020, normal at 40. She reports she was taking Mg-Ca-D at that time.   Patient Care Team: Rita Ohara, MD as PCP - General (Family Medicine) Pieter Partridge, DO as Consulting Physician (Neurology) Juanita Craver, MD as Consulting Physician (Gastroenterology) Adrian Prows, MD as Consulting Physician (Cardiology) Brunetta Genera, MD as Consulting Physician (Hematology) Starling Manns, MD (Orthopedic Surgery) Zonia Kief, MD (Rehabilitation) Erline Levine, MD as Consulting Physician (Neurosurgery) Abd-El-Barr, Rogue Jury, MD as Consulting Physician (Neurosurgery) Eunice Blase, MD (Family Medicine) Mcarthur Rossetti, MD as Consulting Physician (Orthopedic Surgery) Jari Pigg, MD as Consulting Physician (Dermatology) Puschinsky, Fransico Him., MD (General Surgery) Chucky May, MD as Consulting Physician (Psychiatry) Cleaster Corin, OD (Optometry) Gabriela Eves, DMD (Dentistry) [facility]  Depression Screening: [facility] Office Visit from 04/14/2021 in [place]  PHQ-2 Total Score 0        Falls screen:     02/15/2022   11:21 AM 10/25/2021   10:17 AM 09/07/2021   11:41 AM 04/14/2021    1:37 PM 10/12/2020   10:29 AM  [place] in the past  year? 0 0 0 0 0  Number falls in past yr: 0 0 0 0   Injury with Fall? 0 0 0 0 0  Risk for fall due to : No Fall Risks  No Fall Risks No Fall Risks   Follow up Falls evaluation completed  Falls evaluation completed Falls evaluation completed     Denies any memory concerns. Denies any issues with ADLs.   End of Life Discussion:  Patient has a living will and medical power of attorney  PMH, PSH, SH and FH were reviewed and updated.     ROS:  The patient denies anorexia, fever, vision changes, decreased hearing, ear pain, sore throat, breast concerns, chest pain, palpitations, syncope (none since 08/2016), cough, swelling, nausea, vomiting, diarrhea, constipation, abdominal pain, melena, hematochezia, indigestion/heartburn, hematuria, vaginal bleeding, discharge, odor or itch, genital lesions, tremor, suspicious skin lesions, depression, abnormal bleeding/bruising, or enlarged lymph nodes. Sees dermatologist regularly Some vaginal dryness.  Libido improved with testosterone. Denies hot flashes or night sweats. Occasional burning with urination Neuropathy in feet--cold, tingly Headaches (2x/month, managed with tizanidine), back pain per HPI. Some neck pain--occasionally takes an advil and it helps, along with icing. Depression/anxiety stable overall. Under the care of psych. No longer having dizziness, chest pain or shortness of breath.    PHYSICAL EXAM:  LMP 12/16/2012   Wt Readings from Last 3 Encounters:  04/12/22 194 lb (88 kg)  02/15/22 192 lb 6.4 oz (87.3 kg)  12/06/21 184 lb 6.4 oz (83.6 kg)    General Appearance:    Alert, cooperative, no distress, appears stated age.  Head:    Normocephalic, without obvious abnormality, atraumatic, nontender  Eyes:    PERRL, conjunctiva/corneas clear, EOM's intact, fundi benign  Ears:    Normal TM's and external ear canals  Nose:   No drainage or sinus tenderness  Throat:   Normal mucosa  Neck:   Supple, no lymphadenopathy;  thyroid:   no enlargement/ tenderness/nodules; no carotid bruit or JVD. WHSS anteriorly, horizontal, and vertical posterior WHSS. Prominence of Nichols joints L>R, nontender  Back:   No c-spine tenderness or spasm, no trigger points in upper back.  Tender at paraspinous muscles in flank area (lower thoracic spine). No SI tenderness  Lungs:     Clear to auscultation bilaterally without wheezes, rales or ronchi; respirations unlabored  Chest Wall:    No tenderness or deformity   Heart:    regular rate and rhythm, no murmur, rub or gallop  Breast Exam:    no nipple inversion, discharge, skin dimpling, breast masses or axillary lymphadenopathy. Small fibroglandular vs cystic area at 6 o'clock at edge of areola on the R breast, mildly tender. More fibroglandular changes noted througout the L breast.  Abdomen:     Soft, non-tender, nondistended, normoactive bowel sounds, no masses, no hepatosplenomegaly  Genitalia:    normal external genitalia without lesions.  Normal bimanual exam.  Uterus surgically absent. No adnexal masses or tenderness  Rectal:   Normal sphincter tone, no mass. Heme negative stool  Extremities:   No clubbing, cyanosis or edema.  Pulses:   2+ and symmetric all extremities  Skin:   Skin color, texture, turgor normal, lesions. No rashes  Lymph nodes:   Cervical, supraclavicular, inguinal and axillary nodes normal  Neurologic:   Normal strength, sensation and gait; reflexes 1+ and symmetric              Psych:   Normal mood, affect, hygiene and grooming.   ***UPDATE neck ,back Update breast--Small fibroglandular vs cystic area at 6 o'clock at edge of areola on the R breast, mildly tender. More fibroglandular changes noted througout the L breast.???   ASSESSMENT/PLAN:  Flu shot Decline COVID (if refuses) Did she ever get shingrix from the pharmacy??  Did she bring [facility] labs? Does she still see any neurosurg (I know she doesn't go to [facility] anymore, no notes from anyone else).  NuSwab if  any vaginal discharge (had trich in August) No labs needed--cards should be doing lipids (they rx), and had recent labs from heme-onc.  Discussed monthly self breast exams and yearly mammograms; at least 30 minutes of aerobic activity at least 5 days/week, weight-bearing exercise at least 2x/wk; proper sunscreen use reviewed; healthy diet, including goals of calcium and vitamin D intake and alcohol recommendations (less than or equal to 1 drink/day) reviewed; regular seatbelt use; changing batteries in smoke detectors.  Immunization recommendations discussed--continue yearly flu shots. Shingrix recommended needs to get from pharmacy [facility]). COVID recommended, declined by pt. Prevnar-38 age 74. Colonoscopy recommendations reviewed, UTD, due again 01/2023.   Patient is full code, full care    F/u 1 year, sooner prn.    Medicare Attestation I have personally reviewed: The patient's medical and social history Their use of alcohol, tobacco or illicit drugs Their current medications and supplements The patient's functional ability including ADLs,fall risks, home  safety risks, cognitive, and hearing and visual impairment Diet and physical activities Evidence for depression or mood disorders  The patient's weight, height, BMI have been recorded in the chart.  I have made referrals, counseling, and provided education to the patient based on review of the above and I have provided the patient with a written personalized care plan for preventive services.

## 2022-04-25 NOTE — Patient Instructions (Incomplete)
  HEALTH MAINTENANCE RECOMMENDATIONS:  It is recommended that you get at least 30 minutes of aerobic exercise at least 5 days/week (for weight loss, you may need as much as 60-90 minutes). This can be any activity that gets your heart rate up. This can be divided in 10-15 minute intervals if needed, but try and build up your endurance at least once a week.  Weight bearing exercise is also recommended twice weekly.  Eat a healthy diet with lots of vegetables, fruits and fiber.  "Colorful" foods have a lot of vitamins (ie green vegetables, tomatoes, red peppers, etc).  Limit sweet tea, regular sodas and alcoholic beverages, all of which has a lot of calories and sugar.  Up to 1 alcoholic drink daily may be beneficial for women (unless trying to lose weight, watch sugars).  Drink a lot of water.  Calcium recommendations are 1200-1500 mg daily (1500 mg for postmenopausal women or women without ovaries), and vitamin D 1000 IU daily.  This should be obtained from diet and/or supplements (vitamins), and calcium should not be taken all at once, but in divided doses.  Monthly self breast exams and yearly mammograms for women over the age of 17 is recommended.  Sunscreen of at least SPF 30 should be used on all sun-exposed parts of the skin when outside between the hours of 10 am and 4 pm (not just when at beach or pool, but even with exercise, golf, tennis, and yard work!)  Use a sunscreen that says "broad spectrum" so it covers both UVA and UVB rays, and make sure to reapply every 1-2 hours.  Remember to change the batteries in your smoke detectors when changing your clock times in the spring and fall. Carbon monoxide detectors are recommended for your home.  Use your seat belt every time you are in a car, and please drive safely and not be distracted with cell phones and texting while driving.   Lindsey Rodriguez , Thank you for taking time to come for your Medicare Wellness Visit. I appreciate your ongoing  commitment to your health goals. Please review the following plan we discussed and let me know if I can assist you in the future.   This is a list of the screening recommended for you and due dates:  Health Maintenance  Topic Date Due   Medicare Annual Wellness Visit  Done today   COVID-19 Vaccine (1) Never done   Zoster (Shingles) Vaccine (1 of 2) Never done--get from pharmacy   Flu Shot  01/31/2022   Mammogram  07/19/2022   Tetanus Vaccine  09/06/2022--get from pharmacy   Colon Cancer Screening  01/22/2023   Hepatitis C Screening: USPSTF Recommendation to screen - Ages 18-79 yo.  Completed   HIV Screening  Completed   HPV Vaccine  Aged Out   I recommend getting the new shingles vaccine (Shingrix). Since you have Medicare, you will need to get this from the pharmacy, as it is covered by Part D. This is a series of 2 injections, spaced 2 months apart.   This should be separated from other vaccines by at least 2 weeks.  Tetanus booster (TdaP) is due in March--you need to get this from the pharmacy (covered by Medicare Part D).

## 2022-04-26 ENCOUNTER — Ambulatory Visit (INDEPENDENT_AMBULATORY_CARE_PROVIDER_SITE_OTHER): Payer: Medicare PPO | Admitting: Family Medicine

## 2022-04-26 ENCOUNTER — Encounter: Payer: Self-pay | Admitting: Family Medicine

## 2022-04-26 VITALS — BP 136/80 | HR 76 | Ht 66.0 in | Wt 193.4 lb

## 2022-04-26 DIAGNOSIS — G43709 Chronic migraine without aura, not intractable, without status migrainosus: Secondary | ICD-10-CM

## 2022-04-26 DIAGNOSIS — I428 Other cardiomyopathies: Secondary | ICD-10-CM

## 2022-04-26 DIAGNOSIS — D472 Monoclonal gammopathy: Secondary | ICD-10-CM | POA: Diagnosis not present

## 2022-04-26 DIAGNOSIS — E782 Mixed hyperlipidemia: Secondary | ICD-10-CM | POA: Diagnosis not present

## 2022-04-26 DIAGNOSIS — Z0000 Encounter for general adult medical examination without abnormal findings: Secondary | ICD-10-CM | POA: Diagnosis not present

## 2022-04-26 DIAGNOSIS — Z6831 Body mass index (BMI) 31.0-31.9, adult: Secondary | ICD-10-CM | POA: Diagnosis not present

## 2022-04-26 DIAGNOSIS — Z7185 Encounter for immunization safety counseling: Secondary | ICD-10-CM

## 2022-04-26 DIAGNOSIS — Z131 Encounter for screening for diabetes mellitus: Secondary | ICD-10-CM | POA: Diagnosis not present

## 2022-04-26 DIAGNOSIS — F32A Depression, unspecified: Secondary | ICD-10-CM

## 2022-04-26 DIAGNOSIS — M7702 Medial epicondylitis, left elbow: Secondary | ICD-10-CM

## 2022-04-26 DIAGNOSIS — F419 Anxiety disorder, unspecified: Secondary | ICD-10-CM

## 2022-04-26 MED ORDER — EZETIMIBE 10 MG PO TABS: 10.0000 mg | ORAL_TABLET | Freq: Every day | ORAL | 3 refills | Status: DC

## 2022-04-26 MED ORDER — PRAVASTATIN SODIUM 40 MG PO TABS: 40.0000 mg | ORAL_TABLET | Freq: Every day | ORAL | 3 refills | Status: DC

## 2022-04-26 MED ORDER — MELOXICAM 15 MG PO TABS: 15.0000 mg | ORAL_TABLET | Freq: Every day | ORAL | 0 refills | Status: DC

## 2022-04-27 LAB — GLUCOSE, RANDOM: Glucose: 90 mg/dL (ref 70–99)

## 2022-04-27 LAB — LIPID PANEL
Chol/HDL Ratio: 6.2 ratio — ABNORMAL HIGH (ref 0.0–4.4)
Cholesterol, Total: 241 mg/dL — ABNORMAL HIGH (ref 100–199)
HDL: 39 mg/dL — ABNORMAL LOW (ref >39)
LDL Chol Calc (NIH): 158 mg/dL — ABNORMAL HIGH (ref 0–99)
Triglycerides: 237 mg/dL — ABNORMAL HIGH (ref 0–149)
VLDL Cholesterol Cal: 44 mg/dL — ABNORMAL HIGH (ref 5–40)

## 2022-06-05 ENCOUNTER — Other Ambulatory Visit: Payer: Self-pay | Admitting: Family Medicine

## 2022-06-05 DIAGNOSIS — Z1231 Encounter for screening mammogram for malignant neoplasm of breast: Secondary | ICD-10-CM

## 2022-06-14 ENCOUNTER — Encounter: Payer: Self-pay | Admitting: Internal Medicine

## 2022-06-14 ENCOUNTER — Ambulatory Visit: Payer: Medicare PPO | Admitting: Internal Medicine

## 2022-06-14 VITALS — BP 138/87 | HR 66 | Ht 66.0 in | Wt 196.0 lb

## 2022-06-14 DIAGNOSIS — E7800 Pure hypercholesterolemia, unspecified: Secondary | ICD-10-CM

## 2022-06-14 DIAGNOSIS — I10 Essential (primary) hypertension: Secondary | ICD-10-CM

## 2022-06-14 DIAGNOSIS — I428 Other cardiomyopathies: Secondary | ICD-10-CM

## 2022-06-14 MED ORDER — ENTRESTO 24-26 MG PO TABS: 1.0000 | ORAL_TABLET | Freq: Two times a day (BID) | ORAL | 6 refills | Status: DC

## 2022-06-14 MED ORDER — SPIRONOLACTONE 25 MG PO TABS: 12.5000 mg | ORAL_TABLET | Freq: Every day | ORAL | 6 refills | Status: DC

## 2022-06-14 MED ORDER — EMPAGLIFLOZIN 10 MG PO TABS: 10.0000 mg | ORAL_TABLET | Freq: Every day | ORAL | 6 refills | Status: DC

## 2022-06-14 NOTE — Progress Notes (Signed)
Primary Physician/Referring:  Rita Ohara, MD  Patient ID: Lindsey Rodriguez, female    DOB: 05-19-1966, 56 y.o.   MRN: [id number]  Chief Complaint  Patient presents with  . Hypertension  . Follow-up    HPI:    Lindsey Rodriguez  is a 56 y.o.  Caucasian female, with frequent PVCs and  new onset left bundle branch block and non ischemic cardiomyopathy diagnosed in 2017 with severe LV systolic dysfunction, EF has improved with medical therapy to around 40 to 45%.  She has hypertension and hyperlipidemia.  She has also been diagnosed with IgG lambda MGUS, small fiber neuropathy, anxiety and depression, panic attacks and severe degenerative L5S1 spine disease and also fibromyalgia. She underwent L4-S1 fusion in January 2022.  Notably patient has been unable to tolerate higher doses of lisinopril or carvedilol in the past.  Patient has been unable to tolerate Entresto due to fatigue and shortness of breath, she therefore switched back to lisinopril which she previously tolerated without issue.  Patient is now tolerating pravastatin and Zetia without issue and LDL is now well controlled at 86.  Pressure is well controlled.  She has had no recurrence of dyspnea on exertion or chest pain since last office visit.  However she does note that her physical activity has been limited due to recent ankle injury 4 weeks ago.  She is currently walking a maximum of 15 minutes consecutively without chest pain or shortness of breath.  She has not been able to tolerate Crestor, Vytorin and also Lipitor due to severe muscle aches and cramps.     Past Medical History:  Diagnosis Date  . Anemia   . Anxiety panic attacks   [name]  . Arthritis    knees  . Cardiomyopathy, nonischemic ([place]) 04/07/2016   EF left ventricule calculated on Lexiscan stress test at 33% with global hypokinesis.   . Chronic back pain   . Chronic neck pain   . COVID-19 06/2021   treated with Paxlovid through [facility] in [place]   . Depression   . Dysrhythmia    pvc's with cardiomyopathy  . Family history of adverse reaction to anesthesia    mother has problems waking up  . Fibromyalgia    and neuropathy of feet  . History of kidney stones    lithotripsy  03/2016  . Hyperlipidemia   . Hypertension   . Kidney stone    History  . Kidney stones h/o  . Melanoma ([place]) RUE   [name]  . MGUS (monoclonal gammopathy of unknown significance) 06/2015   no treatment just watching recheck blood work 12/2015  . Migraine    OTC med prn - stress related  . Plantar fasciitis right  . Renal stones 2008/2011/2017  . Seasonal allergies   . SVD (spontaneous vaginal delivery)    x 3  . Tendonitis of foot right   Past Surgical History:  Procedure Laterality Date  . ABLATION ON ENDOMETRIOSIS N/A 11/24/2015   Procedure: ABLATION ON ENDOMETRIOSIS;  Surgeon: [name], MD;  Location: [facility];  Service: Gynecology;  Laterality: N/A;  . ANTERIOR LAT LUMBAR FUSION Right 06/15/2016   Procedure: RIGHT LUMBAR THREE-FOUR ANTEROLATERAL LUMBAR INTERBODY FUSION WITH LATERAL PLATE;  Surgeon: Erline Levine, MD;  Location: [facility];  Service: Neurosurgery;  Laterality: Right;  . BACK SURGERY  Dr.Stern, last sx Summer 2011   X 4  (L3-S1)  . BACK SURGERY  06/2016  . BACK SURGERY  07/20/2020   L2-3 lateral  interbody (Left side approach), L2-3 Posterior Sp[facility] Fusion at [facility]  . BLADDER SUSPENSION N/A 11/24/2015   Procedure: TRANSVAGINAL TAPE (TVT) PROCEDURE;  Surgeon: [name], MD;  Location: [facility];  Service: Gynecology;  Laterality: N/A;  . C2-3 nerve ablation Right 06/2019   Dr. Maia Petties (per pt)  . COLONOSCOPY    . CYSTOCELE REPAIR  11/24/2015   Procedure: ANTERIOR REPAIR (CYSTOCELE);  Surgeon: [name], MD;  Location: [facility];  Service: Gynecology;;  . Consuela Mimes N/A 11/24/2015   Procedure: CYSTOSCOPY;  Surgeon: [name], MD;  Location: [facility];  Service: Gynecology;  Laterality: N/A;  . DILATION AND CURETTAGE  OF UTERUS    . ENDOMETRIAL ABLATION  08/2000  . EYE SURGERY Bilateral    Lasik   . KIDNEY STONE SURGERY  2011  . LITHOTRIPSY  08/2017 (L), 03/2018 (R)  . MELANOMA EXCISION  05/2015   right forearm; [name]  . NECK SURGERY  [name]  Summer 2011   X 4  (C4-T1 Level)  . RHINOPLASTY    . ROBOTIC ASSISTED BILATERAL SALPINGO OOPHERECTOMY Bilateral 11/24/2015   Procedure: ROBOTIC ASSISTED BILATERAL SALPINGO OOPHORECTOMY;  Surgeon: [name], MD;  Location: [facility];  Service: Gynecology;  Laterality: Bilateral;--OVARIES NOT REMOVED PER PATH REPORT  . ROBOTIC ASSISTED TOTAL HYSTERECTOMY WITH SALPINGECTOMY Bilateral 11/24/2015   Procedure: ROBOTIC ASSISTED TOTAL HYSTERECTOMY WITH SALPINGECTOMY;  Surgeon: [name], MD;  Location: [facility];  Service: Gynecology;  Laterality: Bilateral;  . TONSILLECTOMY    . TOTAL VAGINAL HYSTERECTOMY    . WISDOM TOOTH EXTRACTION     Family History  Problem Relation Age of Onset  . Hypertension Mother   . Depression Mother   . Migraines Mother   . Hypothyroidism Mother   . Hypercholesterolemia Mother   . COPD Father   . Heart failure Father 76  . Healthy Brother   . Cardiomyopathy Daughter 41       related to COVID (illness or vaccine?)  . Healthy Daughter   . Healthy Daughter   . Healthy Son   . Diabetes Neg Hx    Social History   Tobacco Use  . Smoking status: Former    Packs/day: 0.10    Years: 3.00    Total pack years: 0.30    Types: Cigarettes    Quit date: 07/04/1987    Years since quitting: 34.9  . Smokeless tobacco: Never  Substance Use Topics  . Alcohol use: Yes    Comment: 4/week   Marital Status: Married   ROS  Review of Systems  Constitutional: Negative for weight gain.  Cardiovascular:  Negative for chest pain (no recurrence), claudication, dyspnea on exertion, leg swelling, near-syncope, orthopnea, palpitations, paroxysmal nocturnal dyspnea and syncope.  Neurological:  Negative for dizziness.   Objective       06/14/2022    8:35 AM 04/26/2022    8:58 AM 04/12/2022    9:04 AM  Vitals with BMI  Height '5\' 6"'$  '5\' 6"'$  '5\' 6"'$   Weight 196 lbs 193 lbs 6 oz 194 lbs  BMI 31.65 07.62 26.33  Systolic 354 562 563  Diastolic 87 80 86  Pulse 66 76 87    Orthostatic VS for the past 72 hrs (Last 3 readings):  Patient Position BP Location Cuff Size  06/14/22 0835 Sitting Left Arm Small     Physical Exam Vitals reviewed.  Constitutional:      Appearance: She is well-developed. She is obese.  Neck:     Vascular: No carotid bruit.  Cardiovascular:     Rate and Rhythm: Normal rate and regular rhythm.     Pulses: Intact distal pulses.     Heart sounds: Murmur heard.     Midsystolic murmur is present with a grade of 2/6 radiating to the apex.     No gallop.     Comments: No JVD. Pulmonary:     Effort: Pulmonary effort is normal.     Breath sounds: Normal breath sounds.  Musculoskeletal:     Cervical back: Normal range of motion.     Right lower leg: No edema.     Left lower leg: No edema.  Neurological:     Mental Status: She is alert.  Previous office visit.Physical exam unchanged compared  Laboratory examination:   Recent Labs    09/07/21 1220 10/25/21 0814 04/06/22 0807 04/26/22 1008  NA 140 136 137  --   K 4.9 5.3* 4.4  --   CL 101 100 105  --   CO2 '24 21 28  '$ --   GLUCOSE 101* 89 108* 90  BUN '17 10 12  '$ --   CREATININE 0.75 0.83 0.79  --   CALCIUM 10.2 9.9 9.5  --   GFRNONAA  --   --  >60  --     CrCl cannot be calculated (Patient's most recent lab result is older than the maximum 21 days allowed.).     Latest Ref Rng & Units 04/26/2022   10:08 AM 04/06/2022    8:07 AM 10/25/2021    8:14 AM  CMP  Glucose 70 - 99 mg/dL 90  108  89   BUN 6 - 20 mg/dL  12  10   Creatinine 0.44 - 1.00 mg/dL  0.79  0.83   Sodium 135 - 145 mmol/L  137  136   Potassium 3.5 - 5.1 mmol/L  4.4  5.3   Chloride 98 - 111 mmol/L  105  100   CO2 22 - 32 mmol/L  28  21   Calcium 8.9 - 10.3 mg/dL  9.5  9.9    Total Protein 6.5 - 8.1 g/dL  8.2  7.6   Total Bilirubin 0.3 - 1.2 mg/dL  0.4  0.4   Alkaline Phos 38 - 126 U/L  65  74   AST 15 - 41 U/L  32  33   ALT 0 - 44 U/L  42  43       Latest Ref Rng & Units 04/06/2022    9:35 AM 09/07/2021   12:20 PM 03/08/2021   10:12 AM  CBC  WBC 4.0 - 10.5 K/uL 4.1  3.3  4.6   Hemoglobin 12.0 - 15.0 g/dL 13.5  12.8  13.0   Hematocrit 36.0 - 46.0 % 38.5  38.8  38.8   Platelets 150 - 400 K/uL 202  237  284    Lipid Panel Recent Labs    09/07/21 1220 10/25/21 0812 04/26/22 1008  CHOL 265* 166 241*  TRIG 247* 200* 237*  LDLCALC 171* 86 158*  HDL 48 46 39*  CHOLHDL  --   --  6.2*      Cholesterol, total 205.000 m 04/05/2020 HDL 64.000 mg 04/05/2020 LDL 119.000 m 04/05/2020 Triglycerides 125.000 m 04/05/2020  HEMOGLOBIN A1C Lab Results  Component Value Date   HGBA1C 5.5 01/27/2013   TSH Recent Labs    09/07/21 1327  TSH 1.750     Allergies   Allergies  Allergen Reactions  . Penicillins Shortness Of Breath  and Rash    Has patient had a PCN reaction causing immediate rash, facial/tongue/throat swelling, SOB or lightheadedness with hypotension: no Has patient had a PCN reaction causing severe rash involving mucus membranes or skin necrosis: no Has patient had a PCN reaction that required hospitalization no Has patient had a PCN reaction occurring within the last 10 years: no If all of the above answers are "NO", then may proceed with Cephalosporin use.  Other reaction(s): Unknown  . Codeine Other (See Comments)    Manic depressive Other reaction(s): Unknown  . Voltaren [Diclofenac] Nausea And Vomiting and Other (See Comments)    Pt stated "tore stomach up"  . Crestor [Rosuvastatin] Other (See Comments)    Myalgia  . Diclofenac Sodium   . Vytorin [Ezetimibe-Simvastatin] Other (See Comments)    Severe myalgia  . Erythromycin Rash    Other reaction(s): Unknown    Medications Prior to Visit:   Outpatient Medications Prior to Visit   Medication Sig Dispense Refill  . buPROPion (WELLBUTRIN XL) 150 MG 24 hr tablet Take by mouth.    . carvedilol (COREG) 6.25 MG tablet Take 1 tablet (6.25 mg total) by mouth 2 (two) times daily with a meal. 180 tablet 3  . EPINEPHrine 0.3 mg/0.3 mL IJ SOAJ injection Inject 0.3 mg into the muscle as needed for anaphylaxis. 1 each 0  . ezetimibe (ZETIA) 10 MG tablet Take 1 tablet (10 mg total) by mouth daily. 90 tablet 3  . FLUoxetine (PROZAC) 40 MG capsule Take 2 capsules by mouth daily.    Marland Kitchen lisinopril (ZESTRIL) 10 MG tablet Take 1 tablet (10 mg total) by mouth daily. 90 tablet 3  . LORazepam (ATIVAN) 0.5 MG tablet Take 1-2 tablets (0.5-1 mg total) by mouth 3 (three) times daily as needed for anxiety. Prn anxiety 30 tablet 0  . pravastatin (PRAVACHOL) 40 MG tablet Take 1 tablet (40 mg total) by mouth daily. 90 tablet 3  . tiZANidine (ZANAFLEX) 2 MG tablet Take 1 tablet (2 mg total) by mouth every 6 (six) hours as needed for muscle spasms. 30 tablet 11  . meloxicam (MOBIC) 15 MG tablet Take 1 tablet (15 mg total) by mouth daily. 15 tablet 0   No facility-administered medications prior to visit.   Final Medications at End of Visit    Current Meds  Medication Sig  . buPROPion (WELLBUTRIN XL) 150 MG 24 hr tablet Take by mouth.  . carvedilol (COREG) 6.25 MG tablet Take 1 tablet (6.25 mg total) by mouth 2 (two) times daily with a meal.  . EPINEPHrine 0.3 mg/0.3 mL IJ SOAJ injection Inject 0.3 mg into the muscle as needed for anaphylaxis.  Marland Kitchen ezetimibe (ZETIA) 10 MG tablet Take 1 tablet (10 mg total) by mouth daily.  Marland Kitchen FLUoxetine (PROZAC) 40 MG capsule Take 2 capsules by mouth daily.  Marland Kitchen lisinopril (ZESTRIL) 10 MG tablet Take 1 tablet (10 mg total) by mouth daily.  Marland Kitchen LORazepam (ATIVAN) 0.5 MG tablet Take 1-2 tablets (0.5-1 mg total) by mouth 3 (three) times daily as needed for anxiety. Prn anxiety  . pravastatin (PRAVACHOL) 40 MG tablet Take 1 tablet (40 mg total) by mouth daily.  Marland Kitchen tiZANidine  (ZANAFLEX) 2 MG tablet Take 1 tablet (2 mg total) by mouth every 6 (six) hours as needed for muscle spasms.   Radiology:  No results found.  Cardiac Studies:   Holter monitor 06/06/2017: Sinus rhythm.  Minimum heart rate 54 bpm, average heart rate 71 bpm, maximum heart rate 102 bpm.  Total ventricular ectopy 3.3%.  Less than 1% supraventricular ectopy  No atrial fibrillation, atrial flutter, ventricular tachycardia.  Echocardiogram 10/04/2020: Left ventricle cavity is normal in size and ventricular wall thickness. Abnormal septal wall motion due to left bundle branch block and moderate global hypokinesis. Cannot exclude apical thrombus. LVEF 35-40%. Grade 1 diastolic function, normal left atrial pressure.  Calculated EF 58%. Left atrial cavity is moderately dilated. Mild (grade 1) mitral regurgitation.  No evidence of pulmonary hypertension. No significant change compared to previous study on 12/30/2019.   Exercise Tetrofosmin stress test 09/21/2021: Exercise nuclear stress test was performed using Bruce protocol. Patient reached 7.6 METS, and 88% of age predicted maximum heart rate. Exercise capacity was low. No chest pain reported. Normal heart rate response. Resting hypertension 150/100 mmHg, hypertensive response 210/90 mmHg. Stress EKG inconclusive given sinus tachycardia, LBBB.  SPECT stress images show mildly decrease tracer uptake in basal anteroseptal myocardium, which could be due to LBBB.  Paradoxical wall motion and thickening due to LBBB. Stress LVEF 36%. High risk study due to low LVEF. Inconclusive for ischemia evaluation given LBBB. Consider pharmacological stress test or coronary angiography, if clinically indicated.   Echocardiogram 12/06/2021:  Left ventricle cavity is normal in size. Abnormal septal wall motion due  to left bundle branch block. Moderate global hypokinesis. LVEF 35-40%.  Indeterminate diastolic filling pattern.  Left atrial cavity is normal in size.  Mild  tricuspid regurgitation.  No evidence of pulmonary hypertension.  Previous study on 10/04/2020 reported mod LA dilatation, grade 1 diastolic  dysfunction. No other significant change noted.   EKG   12/06/2021: normal sinus rhythm at a rate up 67 bpm. Left axis. LBBB, no further analysis. Compared to EKG 05/23/2021, no significant change.   Assessment     ICD-10-CM   1. Essential hypertension, benign  I10 EKG 12-Lead    No orders of the defined types were placed in this encounter.  No orders of the defined types were placed in this encounter.   Recommendations:   Lindsey Rodriguez  is a 56 y.o. Caucasian female, with frequent PVCs and  new onset left bundle branch block and non ischemic cardiomyopathy diagnosed in 2017 with severe LV systolic dysfunction, EF has improved with medical therapy to around 40 to 45%.  She has hypertension and hyperlipidemia.  She has also been diagnosed with IgG lambda MGUS, small fiber neuropathy, anxiety and depression, panic attacks and severe degenerative L5S1 spine disease and also fibromyalgia. She underwent L4-S1 fusion in January 2022.   Essential hypertension, benign   Nonischemic cardiomyopathy EF 35-40% Patient is not on GDMT for her cardiomyopathy Will add Jardiance   Hyperlipidemia Patient tolerating pravastatin and zetia   Follow-up in 3 months, sooner if needed.   Floydene Flock, DO, [facility] 06/14/2022, 8:47 AM Office: [phone]

## 2022-06-15 ENCOUNTER — Encounter: Payer: Self-pay | Admitting: Internal Medicine

## 2022-06-15 NOTE — Telephone Encounter (Signed)
From patient.

## 2022-06-16 NOTE — Telephone Encounter (Signed)
Wasit no no no she needs coreg or toprol for cardiomyopathy she can be on propranolol too but needs either coreg or toprol

## 2022-06-20 ENCOUNTER — Encounter: Payer: Self-pay | Admitting: Internal Medicine

## 2022-06-21 NOTE — Telephone Encounter (Signed)
From patient.

## 2022-06-21 NOTE — Telephone Encounter (Signed)
yes

## 2022-06-24 LAB — BASIC METABOLIC PANEL
BUN/Creatinine Ratio: 19 (ref 9–23)
BUN: 18 mg/dL (ref 6–24)
CO2: 21 mmol/L (ref 20–29)
Calcium: 9.7 mg/dL (ref 8.7–10.2)
Chloride: 104 mmol/L (ref 96–106)
Creatinine, Ser: 0.93 mg/dL (ref 0.57–1.00)
Glucose: 108 mg/dL — ABNORMAL HIGH (ref 70–99)
Potassium: 5.3 mmol/L — ABNORMAL HIGH (ref 3.5–5.2)
Sodium: 137 mmol/L (ref 134–144)
eGFR: 72 mL/min/{1.73_m2} (ref >59)

## 2022-06-28 ENCOUNTER — Encounter: Payer: Self-pay | Admitting: Internal Medicine

## 2022-06-28 ENCOUNTER — Ambulatory Visit: Payer: Medicare PPO | Admitting: Internal Medicine

## 2022-06-28 VITALS — BP 130/85 | HR 72 | Ht 66.0 in | Wt 194.0 lb

## 2022-06-28 DIAGNOSIS — I428 Other cardiomyopathies: Secondary | ICD-10-CM

## 2022-06-28 DIAGNOSIS — I10 Essential (primary) hypertension: Secondary | ICD-10-CM

## 2022-06-28 DIAGNOSIS — E7800 Pure hypercholesterolemia, unspecified: Secondary | ICD-10-CM

## 2022-06-28 MED ORDER — VELTASSA 8.4 G PO PACK: 8.4000 g | PACK | Freq: Every day | ORAL | 6 refills | Status: DC

## 2022-06-28 NOTE — Progress Notes (Signed)
Please tell her to stop spironolactone and can you order repeat BMP for her in 1 week please

## 2022-06-28 NOTE — Progress Notes (Signed)
Primary Physician/Referring:  Rita Ohara, MD  Patient ID: Lindsey Rodriguez, female    DOB: [birth year], 56 y.o.   MRN: [id number]  Chief Complaint  Patient presents with  . Hypertension  . Follow-up    HPI:    Lindsey Rodriguez  is a 56 y.o.  Caucasian female, with frequent PVCs and  new onset left bundle branch block and non ischemic cardiomyopathy diagnosed in 2017 with severe LV systolic dysfunction, EF has improved with medical therapy to around 40 to 45%.  She has hypertension and hyperlipidemia.  She has also been diagnosed with IgG lambda MGUS, small fiber neuropathy, anxiety and depression, panic attacks and severe degenerative L5S1 spine disease and also fibromyalgia. She underwent L4-S1 fusion in January 2022.    Patient presents for follow-up visit. Her ejection fraction is still low on recent echocardiogram. Patient is tolerating her medications well without issue. She is willing to try maximal medical therapy for her cardiomyopathy and we will see if her heart can recover. Patient will come back in one week and keep blood pressure log every day. Denies chest pain, shortness of breath, palpitations, diaphoresis, syncope, edema, PND, orthopnea.   Past Medical History:  Diagnosis Date  . Anemia   . Anxiety panic attacks   [name]  . Arthritis    knees  . Cardiomyopathy, nonischemic ([place]) 04/07/2016   EF left ventricule calculated on Lexiscan stress test at 33% with global hypokinesis.   . Chronic back pain   . Chronic neck pain   . COVID-19 06/2021   treated with Paxlovid through [facility] in [place]  . Depression   . Dysrhythmia    pvc's with cardiomyopathy  . Family history of adverse reaction to anesthesia    mother has problems waking up  . Fibromyalgia    and neuropathy of feet  . History of kidney stones    lithotripsy  03/2016  . Hyperlipidemia   . Hypertension   . Kidney stone    History  . Kidney stones h/o  . Melanoma ([place]) RUE   [name]  .  MGUS (monoclonal gammopathy of unknown significance) 06/2015   no treatment just watching recheck blood work 12/2015  . Migraine    OTC med prn - stress related  . Plantar fasciitis right  . Renal stones 2008/2011/2017  . Seasonal allergies   . SVD (spontaneous vaginal delivery)    x 3  . Tendonitis of foot right   Past Surgical History:  Procedure Laterality Date  . ABLATION ON ENDOMETRIOSIS N/A 11/24/2015   Procedure: ABLATION ON ENDOMETRIOSIS;  Surgeon: [name], MD;  Location: [facility];  Service: Gynecology;  Laterality: N/A;  . ANTERIOR LAT LUMBAR FUSION Right 06/15/2016   Procedure: RIGHT LUMBAR THREE-FOUR ANTEROLATERAL LUMBAR INTERBODY FUSION WITH LATERAL PLATE;  Surgeon: Erline Levine, MD;  Location: [facility];  Service: Neurosurgery;  Laterality: Right;  . BACK SURGERY  Dr.Stern, last sx Summer 2011   X 4  (L3-S1)  . BACK SURGERY  06/2016  . BACK SURGERY  07/20/2020   L2-3 lateral interbody (Left side approach), L2-3 Posterior Spinal Fusion at [facility]  . BLADDER SUSPENSION N/A 11/24/2015   Procedure: TRANSVAGINAL TAPE (TVT) PROCEDURE;  Surgeon: [name], MD;  Location: [facility];  Service: Gynecology;  Laterality: N/A;  . C2-3 nerve ablation Right 06/2019   Dr. Maia Petties (per pt)  . COLONOSCOPY    . CYSTOCELE REPAIR  11/24/2015   Procedure: ANTERIOR REPAIR (CYSTOCELE);  Surgeon: [name], MD;  Location: [facility];  Service: Gynecology;;  . Consuela Mimes N/A 11/24/2015   Procedure: CYSTOSCOPY;  Surgeon: [name], MD;  Location: [facility];  Service: Gynecology;  Laterality: N/A;  . DILATION AND CURETTAGE OF UTERUS    . ENDOMETRIAL ABLATION  08/2000  . EYE SURGERY Bilateral    Lasik   . KIDNEY STONE SURGERY  2011  . LITHOTRIPSY  08/2017 (L), 03/2018 (R)  . MELANOMA EXCISION  05/2015   right forearm; [name]  . NECK SURGERY  [name]  Summer 2011   X 4  (C4-T1 Level)  . RHINOPLASTY    . ROBOTIC ASSISTED BILATERAL SALPINGO OOPHERECTOMY Bilateral 11/24/2015    Procedure: ROBOTIC ASSISTED BILATERAL SALPINGO OOPHORECTOMY;  Surgeon: [name], MD;  Location: [facility];  Service: Gynecology;  Laterality: Bilateral;--OVARIES NOT REMOVED PER PATH REPORT  . ROBOTIC ASSISTED TOTAL HYSTERECTOMY WITH SALPINGECTOMY Bilateral 11/24/2015   Procedure: ROBOTIC ASSISTED TOTAL HYSTERECTOMY WITH SALPINGECTOMY;  Surgeon: [name], MD;  Location: [facility];  Service: Gynecology;  Laterality: Bilateral;  . TONSILLECTOMY    . TOTAL VAGINAL HYSTERECTOMY    . WISDOM TOOTH EXTRACTION     Family History  Problem Relation Age of Onset  . Hypertension Mother   . Depression Mother   . Migraines Mother   . Hypothyroidism Mother   . Hypercholesterolemia Mother   . COPD Father   . Heart failure Father 49  . Healthy Brother   . Cardiomyopathy Daughter 26       related to COVID (illness or vaccine?)  . Healthy Daughter   . Healthy Daughter   . Healthy Son   . Diabetes Neg Hx    Social History   Tobacco Use  . Smoking status: Former    Packs/day: 0.10    Years: 3.00    Total pack years: 0.30    Types: Cigarettes    Quit date: 07/04/1987    Years since quitting: 35.0  . Smokeless tobacco: Never  Substance Use Topics  . Alcohol use: Yes    Comment: 4/week   Marital Status: Married   ROS  Review of Systems  Constitutional: Negative for weight gain.  Cardiovascular:  Negative for chest pain (no recurrence), claudication, dyspnea on exertion, leg swelling, near-syncope, orthopnea, palpitations, paroxysmal nocturnal dyspnea and syncope.  Neurological:  Negative for dizziness.   Objective      06/28/2022    9:09 AM 06/14/2022    8:35 AM 04/26/2022    8:58 AM  Vitals with BMI  Height '5\' 6"'$  '5\' 6"'$  '5\' 6"'$   Weight 194 lbs 196 lbs 193 lbs 6 oz  BMI 31.33 57.32 20.25  Systolic 427 062 376  Diastolic 85 87 80  Pulse 72 66 76    Orthostatic VS for the past 72 hrs (Last 3 readings):  Patient Position BP Location Cuff Size  06/28/22 0909 Sitting Left Arm  Small    Physical Exam Vitals reviewed.  Constitutional:      Appearance: She is well-developed. She is obese.  Neck:     Vascular: No carotid bruit.  Cardiovascular:     Rate and Rhythm: Normal rate and regular rhythm.     Pulses: Intact distal pulses.     Heart sounds: Murmur heard.     Midsystolic murmur is present with a grade of 2/6 radiating to the apex.     No gallop.     Comments: No JVD. Pulmonary:     Effort: Pulmonary effort is normal.     Breath sounds:  Normal breath sounds.  Musculoskeletal:     Cervical back: Normal range of motion.     Right lower leg: No edema.     Left lower leg: No edema.  Neurological:     Mental Status: She is alert.  Previous office visit.Physical exam unchanged compared  Laboratory examination:   Recent Labs    10/25/21 0814 04/06/22 0807 04/26/22 1008 06/23/22 0802  NA 136 137  --  137  K 5.3* 4.4  --  5.3*  CL 100 105  --  104  CO2 21 28  --  21  GLUCOSE 89 108* 90 108*  BUN 10 12  --  18  CREATININE 0.83 0.79  --  0.93  CALCIUM 9.9 9.5  --  9.7  GFRNONAA  --  >60  --   --     estimated creatinine clearance is 74.6 mL/min (by C-G formula based on SCr of 0.93 mg/dL).     Latest Ref Rng & Units 06/23/2022    8:02 AM 04/26/2022   10:08 AM 04/06/2022    8:07 AM  CMP  Glucose 70 - 99 mg/dL 108  90  108   BUN 6 - 24 mg/dL 18   12   Creatinine 0.57 - 1.00 mg/dL 0.93   0.79   Sodium 134 - 144 mmol/L 137   137   Potassium 3.5 - 5.2 mmol/L 5.3   4.4   Chloride 96 - 106 mmol/L 104   105   CO2 20 - 29 mmol/L 21   28   Calcium 8.7 - 10.2 mg/dL 9.7   9.5   Total Protein 6.5 - 8.1 g/dL   8.2   Total Bilirubin 0.3 - 1.2 mg/dL   0.4   Alkaline Phos 38 - 126 U/L   65   AST 15 - 41 U/L   32   ALT 0 - 44 U/L   42       Latest Ref Rng & Units 04/06/2022    9:35 AM 09/07/2021   12:20 PM 03/08/2021   10:12 AM  CBC  WBC 4.0 - 10.5 K/uL 4.1  3.3  4.6   Hemoglobin 12.0 - 15.0 g/dL 13.5  12.8  13.0   Hematocrit 36.0 - 46.0 % 38.5  38.8   38.8   Platelets 150 - 400 K/uL 202  237  284    Lipid Panel Recent Labs    09/07/21 1220 10/25/21 0812 04/26/22 1008  CHOL 265* 166 241*  TRIG 247* 200* 237*  LDLCALC 171* 86 158*  HDL 48 46 39*  CHOLHDL  --   --  6.2*      Cholesterol, total 205.000 m 04/05/2020 HDL 64.000 mg 04/05/2020 LDL 119.000 m 04/05/2020 Triglycerides 125.000 m 04/05/2020  HEMOGLOBIN A1C Lab Results  Component Value Date   HGBA1C 5.5 01/27/2013   TSH Recent Labs    09/07/21 1327  TSH 1.750     Allergies   Allergies  Allergen Reactions  . Penicillins Shortness Of Breath and Rash    Has patient had a PCN reaction causing immediate rash, facial/tongue/throat swelling, SOB or lightheadedness with hypotension: no Has patient had a PCN reaction causing severe rash involving mucus membranes or skin necrosis: no Has patient had a PCN reaction that required hospitalization no Has patient had a PCN reaction occurring within the last 10 years: no If all of the above answers are "NO", then may proceed with Cephalosporin use.  Other reaction(s): Unknown  . Codeine  Other (See Comments)    Manic depressive Other reaction(s): Unknown  . Voltaren [Diclofenac] Nausea And Vomiting and Other (See Comments)    Pt stated "tore stomach up"  . Crestor [Rosuvastatin] Other (See Comments)    Myalgia  . Diclofenac Sodium   . Vytorin [Ezetimibe-Simvastatin] Other (See Comments)    Severe myalgia  . Erythromycin Rash    Other reaction(s): Unknown    Medications Prior to Visit:   Outpatient Medications Prior to Visit  Medication Sig Dispense Refill  . buPROPion (WELLBUTRIN XL) 150 MG 24 hr tablet Take by mouth.    . carvedilol (COREG) 6.25 MG tablet Take 1 tablet (6.25 mg total) by mouth 2 (two) times daily with a meal. 180 tablet 3  . empagliflozin (JARDIANCE) 10 MG TABS tablet Take 1 tablet (10 mg total) by mouth daily before breakfast. 30 tablet 6  . EPINEPHrine 0.3 mg/0.3 mL IJ SOAJ injection Inject 0.3  mg into the muscle as needed for anaphylaxis. 1 each 0  . ezetimibe (ZETIA) 10 MG tablet Take 1 tablet (10 mg total) by mouth daily. 90 tablet 3  . FLUoxetine (PROZAC) 40 MG capsule Take 2 capsules by mouth daily.    Marland Kitchen LORazepam (ATIVAN) 0.5 MG tablet Take 1-2 tablets (0.5-1 mg total) by mouth 3 (three) times daily as needed for anxiety. Prn anxiety 30 tablet 0  . pravastatin (PRAVACHOL) 40 MG tablet Take 1 tablet (40 mg total) by mouth daily. 90 tablet 3  . sacubitril-valsartan (ENTRESTO) 24-26 MG Take 1 tablet by mouth 2 (two) times daily. 60 tablet 6  . spironolactone (ALDACTONE) 25 MG tablet Take 0.5 tablets (12.5 mg total) by mouth daily. 30 tablet 6  . tiZANidine (ZANAFLEX) 2 MG tablet Take 1 tablet (2 mg total) by mouth every 6 (six) hours as needed for muscle spasms. 30 tablet 11  . propranolol (INDERAL) 20 MG tablet Take 20 mg by mouth daily as needed.     No facility-administered medications prior to visit.   Final Medications at End of Visit    Current Meds  Medication Sig  . buPROPion (WELLBUTRIN XL) 150 MG 24 hr tablet Take by mouth.  . carvedilol (COREG) 6.25 MG tablet Take 1 tablet (6.25 mg total) by mouth 2 (two) times daily with a meal.  . empagliflozin (JARDIANCE) 10 MG TABS tablet Take 1 tablet (10 mg total) by mouth daily before breakfast.  . EPINEPHrine 0.3 mg/0.3 mL IJ SOAJ injection Inject 0.3 mg into the muscle as needed for anaphylaxis.  Marland Kitchen ezetimibe (ZETIA) 10 MG tablet Take 1 tablet (10 mg total) by mouth daily.  Marland Kitchen FLUoxetine (PROZAC) 40 MG capsule Take 2 capsules by mouth daily.  Marland Kitchen LORazepam (ATIVAN) 0.5 MG tablet Take 1-2 tablets (0.5-1 mg total) by mouth 3 (three) times daily as needed for anxiety. Prn anxiety  . pravastatin (PRAVACHOL) 40 MG tablet Take 1 tablet (40 mg total) by mouth daily.  . sacubitril-valsartan (ENTRESTO) 24-26 MG Take 1 tablet by mouth 2 (two) times daily.  Marland Kitchen spironolactone (ALDACTONE) 25 MG tablet Take 0.5 tablets (12.5 mg total) by mouth  daily.  Marland Kitchen tiZANidine (ZANAFLEX) 2 MG tablet Take 1 tablet (2 mg total) by mouth every 6 (six) hours as needed for muscle spasms.   Radiology:  No results found.  Cardiac Studies:   Holter monitor 06/06/2017: Sinus rhythm.  Minimum heart rate 54 bpm, average heart rate 71 bpm, maximum heart rate 102 bpm.  Total ventricular ectopy 3.3%.  Less than 1% supraventricular  ectopy  No atrial fibrillation, atrial flutter, ventricular tachycardia.  Echocardiogram 10/04/2020: Left ventricle cavity is normal in size and ventricular wall thickness. Abnormal septal wall motion due to left bundle branch block and moderate global hypokinesis. Cannot exclude apical thrombus. LVEF 35-40%. Grade 1 diastolic function, normal left atrial pressure.  Calculated EF 58%. Left atrial cavity is moderately dilated. Mild (grade 1) mitral regurgitation.  No evidence of pulmonary hypertension. No significant change compared to previous study on 12/30/2019.   Exercise Tetrofosmin stress test 09/21/2021: Exercise nuclear stress test was performed using Bruce protocol. Patient reached 7.6 METS, and 88% of age predicted maximum heart rate. Exercise capacity was low. No chest pain reported. Normal heart rate response. Resting hypertension 150/100 mmHg, hypertensive response 210/90 mmHg. Stress EKG inconclusive given sinus tachycardia, LBBB.  SPECT stress images show mildly decrease tracer uptake in basal anteroseptal myocardium, which could be due to LBBB.  Paradoxical wall motion and thickening due to LBBB. Stress LVEF 36%. High risk study due to low LVEF. Inconclusive for ischemia evaluation given LBBB. Consider pharmacological stress test or coronary angiography, if clinically indicated.   Echocardiogram 12/06/2021:  Left ventricle cavity is normal in size. Abnormal septal wall motion due  to left bundle branch block. Moderate global hypokinesis. LVEF 35-40%.  Indeterminate diastolic filling pattern.  Left atrial cavity is  normal in size.  Mild tricuspid regurgitation.  No evidence of pulmonary hypertension.  Previous study on 10/04/2020 reported mod LA dilatation, grade 1 diastolic  dysfunction. No other significant change noted.   EKG   12/06/2021: normal sinus rhythm at a rate up 67 bpm. Left axis. LBBB, no further analysis. Compared to EKG 05/23/2021, no significant change.   06/14/2022: normal sinus, left axis. LBBB. No change compared to prior  Assessment   No diagnosis found.  No orders of the defined types were placed in this encounter.  No orders of the defined types were placed in this encounter.   Recommendations:   ANITRIA ANDON  is a 56 y.o. Caucasian female, with frequent PVCs and  new onset left bundle branch block and non ischemic cardiomyopathy diagnosed in 2017 with severe LV systolic dysfunction, EF has improved with medical therapy to around 40 to 45%.  She has hypertension and hyperlipidemia.  She has also been diagnosed with IgG lambda MGUS, small fiber neuropathy, anxiety and depression, panic attacks and severe degenerative L5S1 spine disease and also fibromyalgia. She underwent L4-S1 fusion in January 2022.   Essential hypertension, benign Continue current cardiac medications. Must be on coreg or toprol for cardiomyopathy - propranolol is not within GDMT for NICM Encourage low-sodium diet, less than 2000 mg daily. Patient will check her BP every day given all these new changes   Nonischemic cardiomyopathy EF 35-40% Patient is not on GDMT for her cardiomyopathy Will add Jardiance, spironolactone, and entresto Patient has not been on GDMT, if EF remains down on this will refer for CRT Entresto (starting Monday) - last lisinopril dose was 06/13/2022 in the evening Repeat echocardiogram in 6 months Check BMP in 1 week   Hyperlipidemia Patient tolerating pravastatin and zetia   Follow-up in 1 week, sooner if needed.    Floydene Flock, DO, [facility] 06/28/2022, 9:14  AM Office: [phone]

## 2022-06-29 ENCOUNTER — Encounter: Payer: Self-pay | Admitting: Family Medicine

## 2022-06-29 DIAGNOSIS — M25529 Pain in unspecified elbow: Secondary | ICD-10-CM

## 2022-06-30 NOTE — Progress Notes (Signed)
Lindsey Rodriguez D.[facility] [address] [place] [address] Phone: [phone]   Assessment and Plan:     1. Left elbow pain 2. 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  May use Tylenol for breakthrough pain - Start HEP for golfers elbow - Recommend using wrist brace at all times for the next 3 weeks to get decrease tension over medial epicondyle - X-ray obtained in clinic.  My interpretation: No acute fracture or dislocation.  Pertinent previous records reviewed include none   Follow Up: 3 weeks for reevaluation.  Could consider physical therapy versus dry needling versus PRP versus advanced imaging versus ultrasound   Subjective:   I, Lindsey Rodriguez, am serving as a Neurosurgeon for Doctor Richardean Sale  Chief Complaint: left elbow pain   HPI:   07/04/2022 Patient is a 56 year old female complaining of left elbow pain. Patient states that she has been in pain for about year , started as a tender spot not she has pain all the time, decreased ROM , deceased grip strength ,does have numbness and tingling occasionally, pain does not radiate, pain is on the inside of the elbow, ib and tylenol on occasion and that does not hep ,   Relevant Historical Information: Hypertension  Additional pertinent review of systems negative.   Current Outpatient Medications:    buPROPion (WELLBUTRIN XL) 150 MG 24 hr tablet, Take by mouth., Disp: , Rfl:    carvedilol (COREG) 6.25 MG tablet, Take 1 tablet (6.25 mg total) by mouth 2 (two) times daily with a meal., Disp: 180 tablet, Rfl: 3   empagliflozin (JARDIANCE) 10 MG TABS tablet, Take 1 tablet (10 mg total) by mouth daily before breakfast., Disp: 30 tablet, Rfl: 6   EPINEPHrine 0.3 mg/0.3 mL IJ SOAJ injection,  Inject 0.3 mg into the muscle as needed for anaphylaxis., Disp: 1 each, Rfl: 0   ezetimibe (ZETIA) 10 MG tablet, Take 1 tablet (10 mg total) by mouth daily., Disp: 90 tablet, Rfl: 3   FLUoxetine (PROZAC) 40 MG capsule, Take 2 capsules by mouth daily., Disp: , Rfl:    LORazepam (ATIVAN) 0.5 MG tablet, Take 1-2 tablets (0.5-1 mg total) by mouth 3 (three) times daily as needed for anxiety. Prn anxiety, Disp: 30 tablet, Rfl: 0   patiromer (VELTASSA) 8.4 g packet, Take 1 packet (8.4 g total) by mouth daily., Disp: 30 each, Rfl: 6   pravastatin (PRAVACHOL) 40 MG tablet, Take 1 tablet (40 mg total) by mouth daily., Disp: 90 tablet, Rfl: 3   propranolol (INDERAL) 20 MG tablet, Take 20 mg by mouth daily as needed., Disp: , Rfl:    sacubitril-valsartan (ENTRESTO) 24-26 MG, Take 1 tablet by mouth 2 (two) times daily., Disp: 60 tablet, Rfl: 6   spironolactone (ALDACTONE) 25 MG tablet, Take 0.5 tablets (12.5 mg total) by mouth daily., Disp: 30 tablet, Rfl: 6   tiZANidine (ZANAFLEX) 2 MG tablet, Take 1 tablet (2 mg total) by mouth every 6 (six) hours as needed for muscle spasms., Disp: 30 tablet, Rfl: 11   Objective:     Vitals:   07/04/22 1430  BP: 120/80  Pulse: 98  SpO2: 98%  Weight: 194 lb (88 kg)  Height: 5\' 6"  (1.676 m)      Body mass index is 31.31 kg/m.  Physical Exam:    General: Appears well, no acute distress, nontoxic and pleasant Neck: FROM, no pain Neuro: sensation is intact distally with no deficits, strenghth is 5/5 in elbow flexors/extenders/supinator/pronators and wrist flexors/extensors Psych: no evidence of anxiety or depression  Left ELBOW: no deformity, swelling or muscle wasting Normal Carrying angle ROM:0-140, supination and pronation 90 TTP medial epicondyle NTTP over triceps, ticeps tendon, olecronon, lat epicondyle,  , antecubital fossa, biceps tendon, supinator, pronator Negative tinnels over cubital tunnel No pain with resisted wrist and middle digit  extension Significant pain with resisted wrist flexion No pain with resisted supination Significant pain with resisted pronation Negative laxity with valgus stress, though mild medial elbow pain Negative varus stress Negative milking maneuver    Electronically signed by:  Lindsey Rodriguez D.[facility] 2:51 PM 07/04/22

## 2022-07-04 ENCOUNTER — Ambulatory Visit (INDEPENDENT_AMBULATORY_CARE_PROVIDER_SITE_OTHER): Payer: Medicare PPO

## 2022-07-04 ENCOUNTER — Ambulatory Visit (INDEPENDENT_AMBULATORY_CARE_PROVIDER_SITE_OTHER): Payer: Medicare PPO | Admitting: Sports Medicine

## 2022-07-04 VITALS — BP 120/80 | HR 98 | Ht 66.0 in | Wt 194.0 lb

## 2022-07-04 DIAGNOSIS — M25522 Pain in left elbow: Secondary | ICD-10-CM

## 2022-07-04 DIAGNOSIS — M7702 Medial epicondylitis, left elbow: Secondary | ICD-10-CM | POA: Diagnosis not present

## 2022-07-04 MED ORDER — CELECOXIB 200 MG PO CAPS: 200.0000 mg | ORAL_CAPSULE | Freq: Two times a day (BID) | ORAL | 0 refills | Status: DC

## 2022-07-04 NOTE — Patient Instructions (Addendum)
Good to see you  Elbow HEP Celebrex 200 mg 2 times daily for 3 weeks  Wrist brace use day and night for 3 weeks  3 week follow up

## 2022-07-09 ENCOUNTER — Encounter: Payer: Self-pay | Admitting: Sports Medicine

## 2022-07-12 LAB — BASIC METABOLIC PANEL
BUN/Creatinine Ratio: 20 (ref 9–23)
BUN: 17 mg/dL (ref 6–24)
CO2: 20 mmol/L (ref 20–29)
Calcium: 9.6 mg/dL (ref 8.7–10.2)
Chloride: 102 mmol/L (ref 96–106)
Creatinine, Ser: 0.86 mg/dL (ref 0.57–1.00)
Glucose: 117 mg/dL — ABNORMAL HIGH (ref 70–99)
Potassium: 4.9 mmol/L (ref 3.5–5.2)
Sodium: 137 mmol/L (ref 134–144)
eGFR: 79 mL/min/{1.73_m2} (ref >59)

## 2022-07-24 NOTE — Progress Notes (Signed)
Lindsey Rodriguez D.[place] Phone: [phone]   Assessment and Plan:     1. Left elbow pain 2. 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  Patient is an mild improvement in symptoms with pain generally not running up or down her arm, but instead is concentrated at medial epicondyle.  Patient continues to have moderate to severe pain at medial epicondyles - Discontinue Celebrex - Start prednisone Dosepak - Continue HEP for golfers elbow - Start physical therapy for golfers elbow - Recommend continued use of wrist brace day and night if possible for the next 3 weeks  Pertinent previous records reviewed include none   Follow Up: 3 to 4 weeks for reevaluation.  Could consider diagnostic ultrasound.  Could further discuss ECSWT versus PRP   Subjective:   I, Lindsey Rodriguez, am serving as a Education administrator for Doctor [name]   Chief Complaint: left elbow pain    HPI:    07/04/2022 Patient is a 57 year old female complaining of left elbow pain. Patient states that she has been in pain for about year , started as a tender spot not she has pain all the time, decreased ROM , deceased grip strength ,does have numbness and tingling occasionally, pain does not radiate, pain is on the inside of the elbow, ib and tylenol on occasion and that does not hep ,   07/25/2022 Patient states that above and below feel better but still TTP and it feels like a hot knife is going through the elbow   Relevant Historical Information: Hypertension  Additional pertinent review of systems negative.   Current Outpatient Medications:    buPROPion (WELLBUTRIN XL) 150 MG 24 hr tablet, Take by mouth., Disp: , Rfl:    carvedilol (COREG) 6.25 MG tablet, Take 1 tablet (6.25 mg total) by mouth 2 (two) times daily with a  meal., Disp: 180 tablet, Rfl: 3   celecoxib (CELEBREX) 200 MG capsule, Take 1 capsule (200 mg total) by mouth 2 (two) times daily., Disp: 60 capsule, Rfl: 0   empagliflozin (JARDIANCE) 10 MG TABS tablet, Take 1 tablet (10 mg total) by mouth daily before breakfast., Disp: 30 tablet, Rfl: 6   EPINEPHrine 0.3 mg/0.3 mL IJ SOAJ injection, Inject 0.3 mg into the muscle as needed for anaphylaxis., Disp: 1 each, Rfl: 0   ezetimibe (ZETIA) 10 MG tablet, Take 1 tablet (10 mg total) by mouth daily., Disp: 90 tablet, Rfl: 3   FLUoxetine (PROZAC) 40 MG capsule, Take 2 capsules by mouth daily., Disp: , Rfl:    LORazepam (ATIVAN) 0.5 MG tablet, Take 1-2 tablets (0.5-1 mg total) by mouth 3 (three) times daily as needed for anxiety. Prn anxiety, Disp: 30 tablet, Rfl: 0   pravastatin (PRAVACHOL) 40 MG tablet, Take 1 tablet (40 mg total) by mouth daily., Disp: 90 tablet, Rfl: 3   sacubitril-valsartan (ENTRESTO) 24-26 MG, Take 1 tablet by mouth 2 (two) times daily., Disp: 60 tablet, Rfl: 6   spironolactone (ALDACTONE) 25 MG tablet, Take 0.5 tablets (12.5 mg total) by mouth daily., Disp: 30 tablet, Rfl: 6   tiZANidine (ZANAFLEX) 2 MG tablet, Take 1 tablet (2 mg total) by mouth every 6 (six) hours as needed for muscle spasms., Disp: 30 tablet, Rfl: 11   methylPREDNISolone (MEDROL DOSEPAK) 4 MG TBPK tablet, Take 6 tablets on day 1.  Take 5  tablets on day 2.  Take 4 tablets on day 3.  Take 3 tablets on day 4.  Take 2 tablets on day 5.  Take 1 tablet on day 6., Disp: 21 tablet, Rfl: 0   patiromer (VELTASSA) 8.4 g packet, Take 1 packet (8.4 g total) by mouth daily., Disp: 30 each, Rfl: 6   propranolol (INDERAL) 20 MG tablet, Take 20 mg by mouth daily as needed., Disp: , Rfl:    Objective:     Vitals:   07/25/22 1042  BP: 122/80  Pulse: 76  SpO2: 98%  Weight: 195 lb (88.5 kg)  Height: '5\' 6"'$  (1.676 m)      Body mass index is 31.47 kg/m.    Physical Exam:    General: Appears well, no acute distress, nontoxic and  pleasant Neck: FROM, no pain Neuro: sensation is intact distally with no deficits, strenghth is 5/5 in elbow flexors/extenders/supinator/pronators and wrist flexors/extensors Psych: no evidence of anxiety or depression   Left ELBOW: no deformity, swelling or muscle wasting Normal Carrying angle ROM:0-140, supination and pronation 90 TTP medial epicondyle NTTP over triceps, ticeps tendon, olecronon, lat epicondyle,  , antecubital fossa, biceps tendon, supinator, pronator Negative tinnels over cubital tunnel No pain with resisted wrist and middle digit extension Significant pain with resisted wrist flexion No pain with resisted supination Significant pain with resisted pronation Negative laxity with valgus stress, though mild medial elbow pain Negative varus stress Negative milking maneuver     Electronically signed by:  Lindsey Rodriguez D.[name] 11:03 AM 07/25/22

## 2022-07-25 ENCOUNTER — Ambulatory Visit: Payer: Medicare PPO | Admitting: Sports Medicine

## 2022-07-25 VITALS — BP 122/80 | HR 76 | Ht 66.0 in | Wt 195.0 lb

## 2022-07-25 DIAGNOSIS — M7702 Medial epicondylitis, left elbow: Secondary | ICD-10-CM | POA: Diagnosis not present

## 2022-07-25 DIAGNOSIS — M25522 Pain in left elbow: Secondary | ICD-10-CM

## 2022-07-25 MED ORDER — METHYLPREDNISOLONE 4 MG PO TBPK: ORAL_TABLET | ORAL | 0 refills | Status: DC

## 2022-07-25 NOTE — Patient Instructions (Addendum)
Good to see you Prednisone dos pak  Referral PT  Continue bracing day and night if possible  Continue HEP  3-4 week follow up

## 2022-08-01 ENCOUNTER — Ambulatory Visit
Admission: RE | Admit: 2022-08-01 | Discharge: 2022-08-01 | Disposition: A | Discharge status: Home / Self Care | Payer: Medicare PPO | Source: Ambulatory Visit

## 2022-08-01 DIAGNOSIS — Z1231 Encounter for screening mammogram for malignant neoplasm of breast: Secondary | ICD-10-CM

## 2022-08-14 NOTE — Progress Notes (Unsigned)
Lindsey Rodriguez D.[place] Phone: [phone]   Assessment and Plan:     There are no diagnoses linked to this encounter.  ***   Pertinent previous records reviewed include ***   Follow Up: ***     Subjective:   I, Rasmus Preusser, am serving as a Education administrator for Doctor [name]   Chief Complaint: left elbow pain    HPI:    07/04/2022 Patient is a 57 year old female complaining of left elbow pain. Patient states that she has been in pain for about year , started as a tender spot not she has pain all the time, decreased ROM , deceased grip strength ,does have numbness and tingling occasionally, pain does not radiate, pain is on the inside of the elbow, ib and tylenol on occasion and that does not hep ,    07/25/2022 Patient states that above and below feel better but still TTP and it feels like a hot knife is going through the elbow   08/15/2022 Patient states    Relevant Historical Information: Hypertension    Additional pertinent review of systems negative.   Current Outpatient Medications:    buPROPion (WELLBUTRIN XL) 150 MG 24 hr tablet, Take by mouth., Disp: , Rfl:    carvedilol (COREG) 6.25 MG tablet, Take 1 tablet (6.25 mg total) by mouth 2 (two) times daily with a meal., Disp: 180 tablet, Rfl: 3   celecoxib (CELEBREX) 200 MG capsule, Take 1 capsule (200 mg total) by mouth 2 (two) times daily., Disp: 60 capsule, Rfl: 0   empagliflozin (JARDIANCE) 10 MG TABS tablet, Take 1 tablet (10 mg total) by mouth daily before breakfast., Disp: 30 tablet, Rfl: 6   EPINEPHrine 0.3 mg/0.3 mL IJ SOAJ injection, Inject 0.3 mg into the muscle as needed for anaphylaxis., Disp: 1 each, Rfl: 0   ezetimibe (ZETIA) 10 MG tablet, Take 1 tablet (10 mg total) by mouth daily., Disp: 90 tablet, Rfl: 3   FLUoxetine (PROZAC) 40 MG capsule, Take 2 capsules by mouth daily., Disp: , Rfl:    LORazepam (ATIVAN) 0.5 MG  tablet, Take 1-2 tablets (0.5-1 mg total) by mouth 3 (three) times daily as needed for anxiety. Prn anxiety, Disp: 30 tablet, Rfl: 0   methylPREDNISolone (MEDROL DOSEPAK) 4 MG TBPK tablet, Take 6 tablets on day 1.  Take 5 tablets on day 2.  Take 4 tablets on day 3.  Take 3 tablets on day 4.  Take 2 tablets on day 5.  Take 1 tablet on day 6., Disp: 21 tablet, Rfl: 0   patiromer (VELTASSA) 8.4 g packet, Take 1 packet (8.4 g total) by mouth daily., Disp: 30 each, Rfl: 6   pravastatin (PRAVACHOL) 40 MG tablet, Take 1 tablet (40 mg total) by mouth daily., Disp: 90 tablet, Rfl: 3   propranolol (INDERAL) 20 MG tablet, Take 20 mg by mouth daily as needed., Disp: , Rfl:    sacubitril-valsartan (ENTRESTO) 24-26 MG, Take 1 tablet by mouth 2 (two) times daily., Disp: 60 tablet, Rfl: 6   spironolactone (ALDACTONE) 25 MG tablet, Take 0.5 tablets (12.5 mg total) by mouth daily., Disp: 30 tablet, Rfl: 6   tiZANidine (ZANAFLEX) 2 MG tablet, Take 1 tablet (2 mg total) by mouth every 6 (six) hours as needed for muscle spasms., Disp: 30 tablet, Rfl: 11   Objective:     There were no vitals filed for this visit.    There is  no height or weight on file to calculate BMI.    Physical Exam:    ***   Electronically signed by:  Lindsey Rodriguez D.[facility] 4:29 PM 08/14/22

## 2022-08-15 ENCOUNTER — Ambulatory Visit: Payer: Medicare PPO | Admitting: Sports Medicine

## 2022-08-15 VITALS — BP 122/82 | HR 63 | Ht 66.0 in | Wt 201.0 lb

## 2022-08-15 DIAGNOSIS — M25522 Pain in left elbow: Secondary | ICD-10-CM

## 2022-08-15 DIAGNOSIS — M7702 Medial epicondylitis, left elbow: Secondary | ICD-10-CM | POA: Diagnosis not present

## 2022-08-15 NOTE — Patient Instructions (Signed)
MRI left elbow  Follow up 3 days after MRI to discuss results

## 2022-08-17 ENCOUNTER — Telehealth: Payer: Self-pay | Admitting: Cardiology

## 2022-08-17 NOTE — Telephone Encounter (Signed)
Discussed with dentist, no endocarditis prophylaxis indicated.  She has not had any acute decompensated heart failure, they can proceed with sedation and also if they have to use epinephrine for hemostasis and perfectly fine with this.

## 2022-08-23 IMAGING — CR DG MYELOGRAPHY LUMBAR INJ MULTI REGION
12 of 19 series · 12 of 19 positions shown · non-contrast
Comparison: none

CLINICAL DATA: Back and right leg pain.
TECHNIQUE: Contiguous axial images were obtained through the Thoracic and
Lumbar spine after the intrathecal infusion of infusion. Coronal and
sagittal reconstructions were obtained of the axial image sets.

[vasc adipose (1 of 11)]
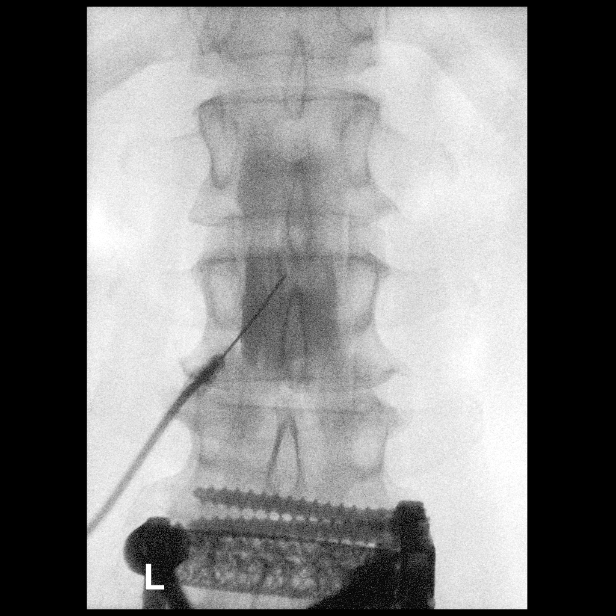

[w cervical spine flexion]
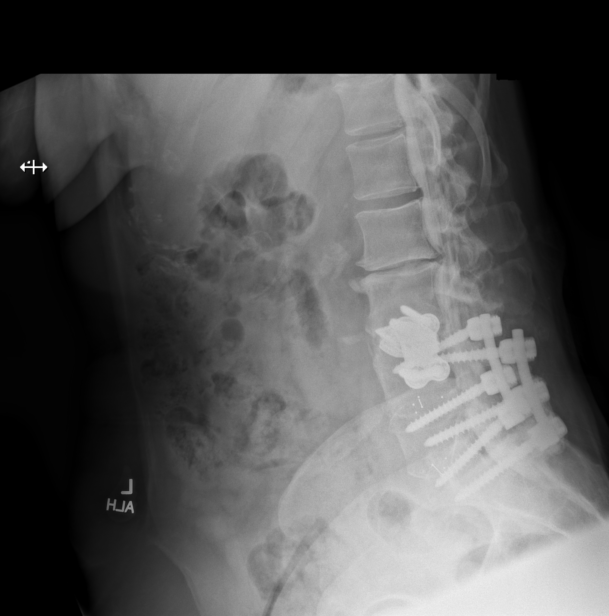

[vasc adipose (2 of 11)]
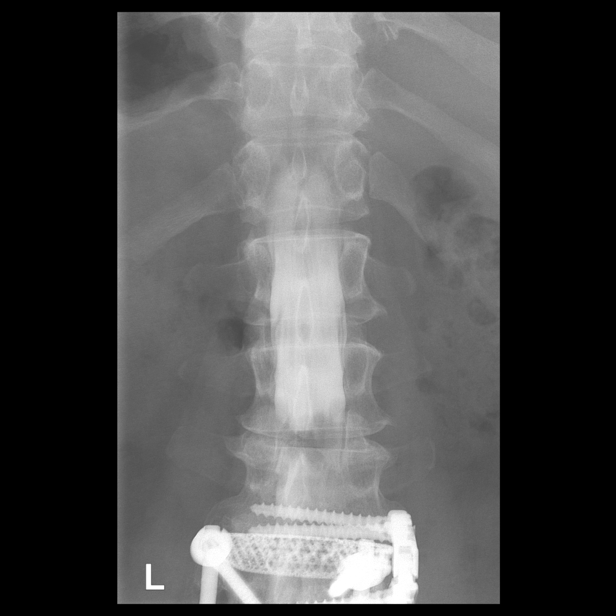

[vasc adipose (3 of 11)]
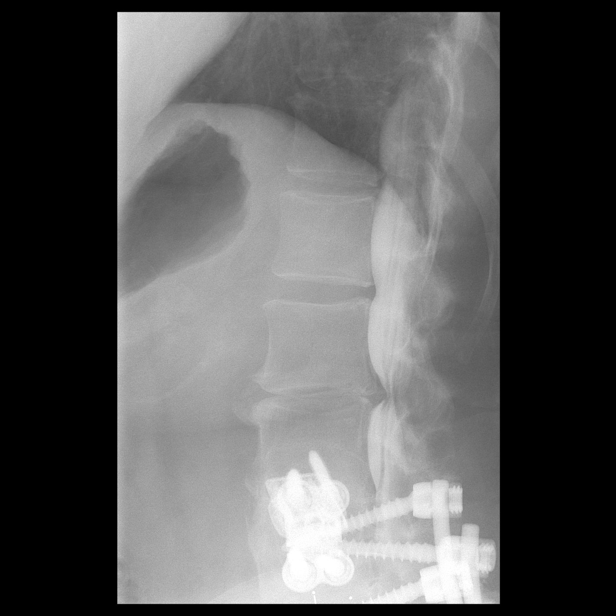

[vasc adipose (4 of 11)]
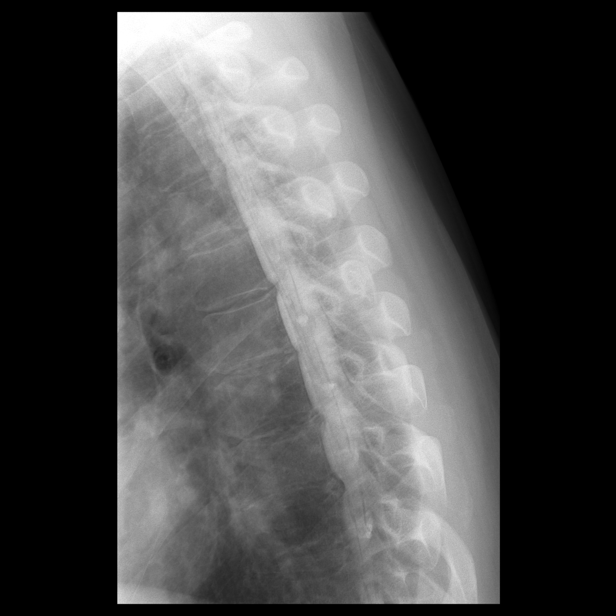

[vasc adipose (5 of 11)]
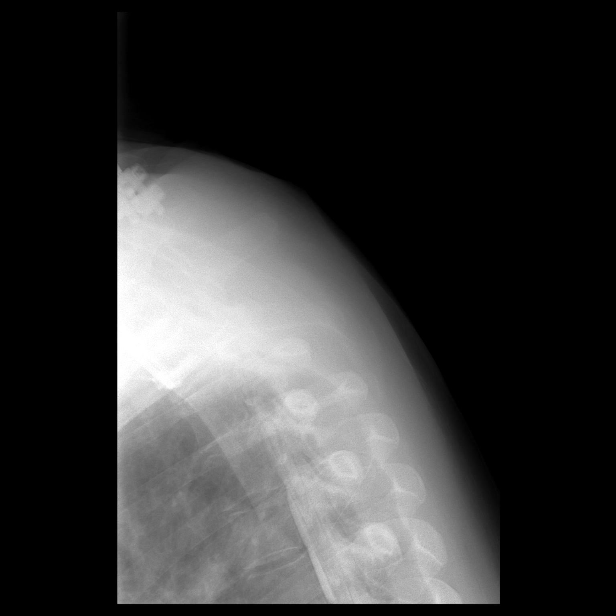

[vasc adipose (6 of 11)]
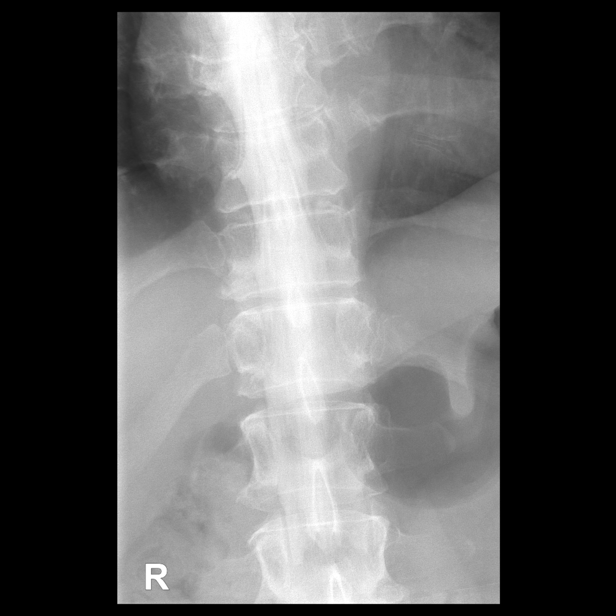

[vasc adipose (7 of 11)]
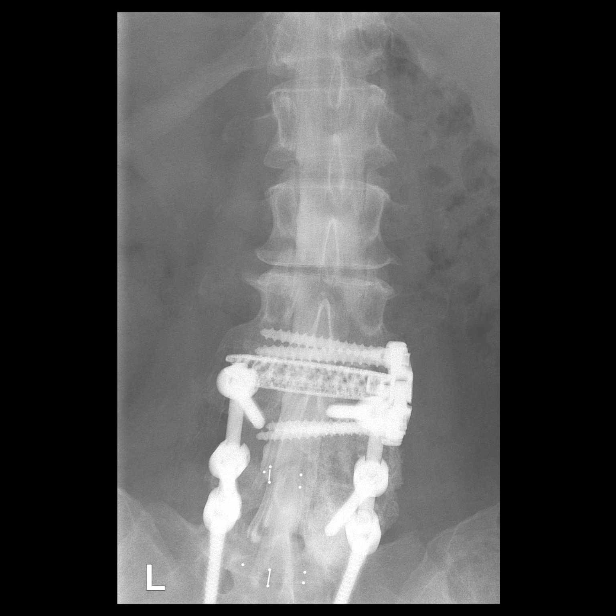

[vasc adipose (8 of 11)]
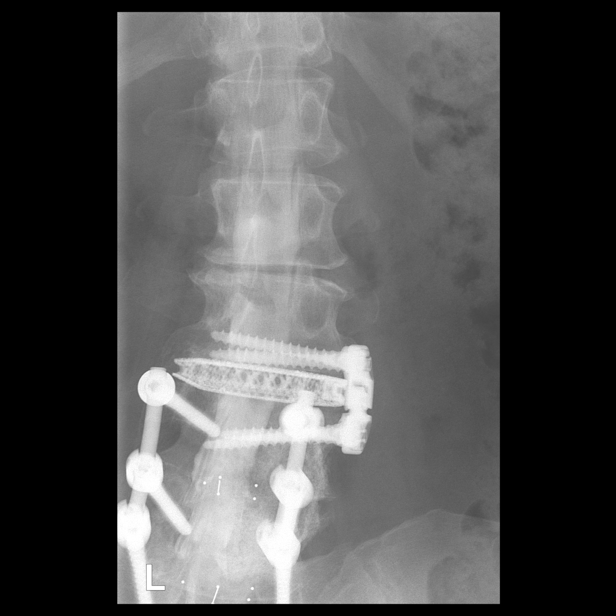

[vasc adipose (9 of 11)]
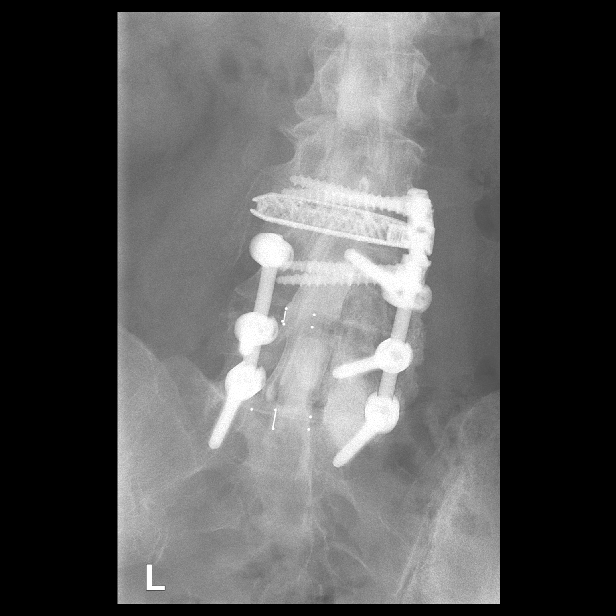

[vasc adipose (10 of 11)]
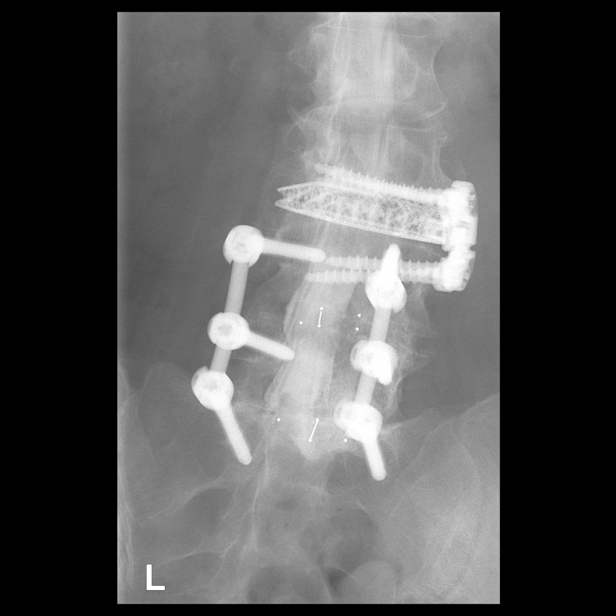

[vasc adipose (11 of 11)]
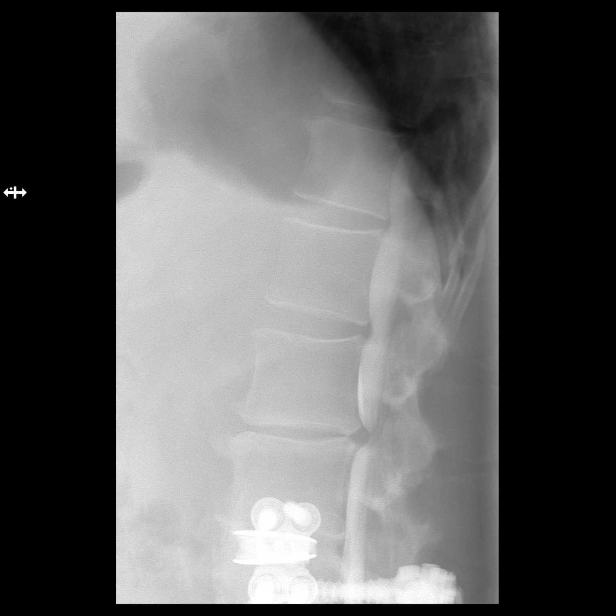

[12 of 19 positions shown; findings below may reference images not displayed]

FLUOROSCOPY TIME:  1 minutes 34 seconds. 420.12 micro gray meter
squared

PROCEDURE:
LUMBAR PUNCTURE FOR THORACIC AND LUMBAR MYELOGRAM

After thorough discussion of risks and benefits of the procedure
including bleeding, infection, injury to nerves, blood vessels,
adjacent structures as well as headache and CSF leak, written and
oral informed consent was obtained. Consent was obtained by Dr. Dohoon
Peterjan.

Patient was positioned prone on the fluoroscopy table. Local
anesthesia was provided with 1% lidocaine without epinephrine after
prepped and draped in the usual sterile fashion. Puncture was
performed at L2-3 using a 3 1/2 inch 22-gauge spinal needle via left
paramedian approach. Using a single pass through the dura, the
needle was placed within the thecal sac, with return of clear CSF.
10 mL of Isovue 0-ZZZ was injected into the thecal sac, with normal
opacification of the nerve roots and cauda equina consistent with
free flow within the subarachnoid space. The patient was then moved
to the trendelenburg position and contrast flowed into the Thoracic
spine region.

I personally performed the lumbar puncture and administered the
intrathecal contrast. I also personally performed acquisition of the
myelogram images.
FINDINGS: THORACIC AND LUMBAR MYELOGRAM FINDINGS:

Lumbar region: Previous lateral discectomy and fusion at L3-4.
Previous discectomy and posterior fusion from L4 to the sacrum. Wide
patency of the canal and foramina at the surgical levels.
Degenerative spondylosis at L2-3 with disc space narrowing. Moderate
anterior extradural defect. No abnormal motion with flexion and
extension. The patient does not generate much bending motion. Small
anterior extradural defect at L1-2. No abnormal motion at that
level.

Thoracic region: Thoracic spinal canal is widely patent. Small
anterior extradural defect suggesting mild non-compressive thoracic
disc bulges. No apparent compressive narrowing of the canal.

CT THORACIC MYELOGRAM FINDINGS:

Mild thoracic curvature convex to the right and thoracolumbar
curvature convex to the left. No antero or retrolisthesis. Cervical
fusion extends down as far as the T1 level with solid union.

T1-2: Bilateral facet hypertrophy with mild bilateral foraminal
stenosis. Mild bulging of the disc but no compressive central canal
stenosis.

T2-3 and T3-4: Normal

T4-5 through T7-8: Small, non-compressive disc bulges. Sufficient
patency of the canal and foramina. Mild facet osteoarthritis without
foraminal encroachment.

T8-9: Small endplate osteophytes. Small chronic disc protrusion.
Narrowing of the ventral subarachnoid space but no compressive
narrowing of the canal or foramina. Mild facet hypertrophy.

T9-10: Normal interspace.

T10-11: Chronic calcified central disc herniation with slight upward
migration behind inferior T10. Effacement of the ventral
subarachnoid space with slight indentation of the ventral cord.
There is ample subarachnoid space present dorsal to the cord,
therefore no cord compression. Foramina sufficiently patent.

T11-12 and T12-L1: Small noncompressive disc bulges.

CT LUMBAR MYELOGRAM FINDINGS:

L1-2: Minor disc bulge without neural compression.

L2-3: Disc degeneration with loss of disc height. Endplate
osteophytes and shallow protrusion of the disc more prominent to the
right of midline. Facet and ligamentous hypertrophy. Moderate
multifactorial stenosis at this level that could cause neural
compression in either lateral recess.

L3-4: Previous lateral discectomy and fusion. Solid union with
sufficient patency of the canal and foramina.

L4 to sacrum: Previous posterior decompression, diskectomy and
fusion. Solid union with sufficient patency of the canal and
foramina.

Bilateral sacroiliac osteoarthritis which could be painful.
IMPRESSION: 1. Lumbar region: Previous posterior decompression, diskectomy and
fusion from L4 to the sacrum. Solid union with sufficient patency of
the canal and foramina. Previous lateral discectomy and fusion L3-4.
Solid union with sufficient patency of the canal and foramina.
2. L2-3 disc degeneration with disc space narrowing. Endplate
osteophytes and shallow protrusion of the disc more prominent to the
right of midline. Facet and ligamentous hypertrophy. Moderate
multifactorial stenosis that could cause neural compression in
either lateral recess.
3. Thoracic region: T10-11: Chronic calcified central disc
herniation with slight upward migration behind T10. Effacement of
the ventral subarachnoid space with slight indentation of the
ventral cord but no cord compression, with subarachnoid space
present dorsal to the cord.
4. T1-2 facet hypertrophy with mild bilateral foraminal stenosis.

## 2022-08-23 IMAGING — CT CT L SPINE W/ CM
1 of 7 series · 5 of 14 positions shown, 7 images · non-contrast
Comparison: none

CLINICAL DATA: Back and right leg pain.
TECHNIQUE: Contiguous axial images were obtained through the Thoracic and
Lumbar spine after the intrathecal infusion of infusion. Coronal and
sagittal reconstructions were obtained of the axial image sets.

[Series 3: l spine soft · axial · 0.32mm/px · z∈[-375,-219]mm · 5 of 79 slices shown, 7 images]
[im 14/79  soft-tissue]
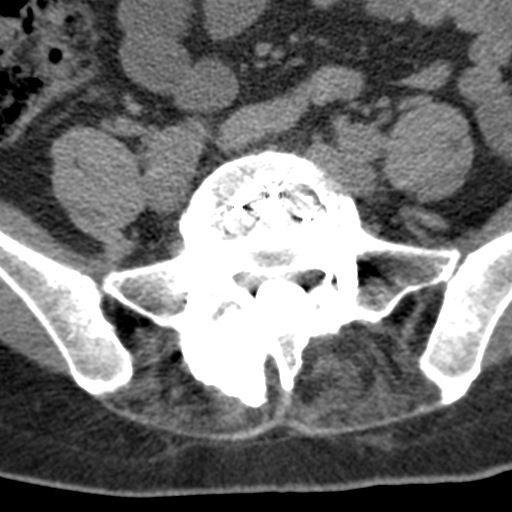
[im 14/79  bone]
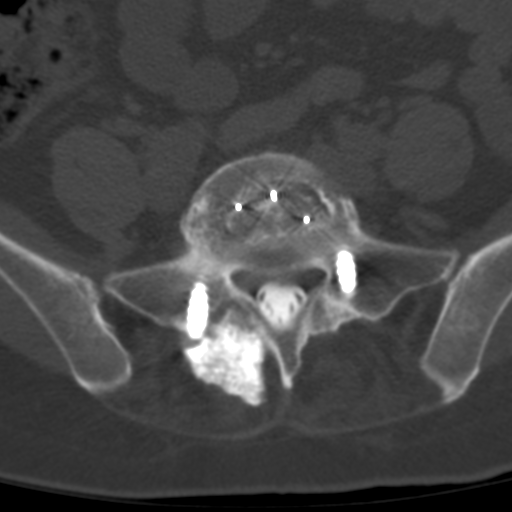
[im 27/79  bone]
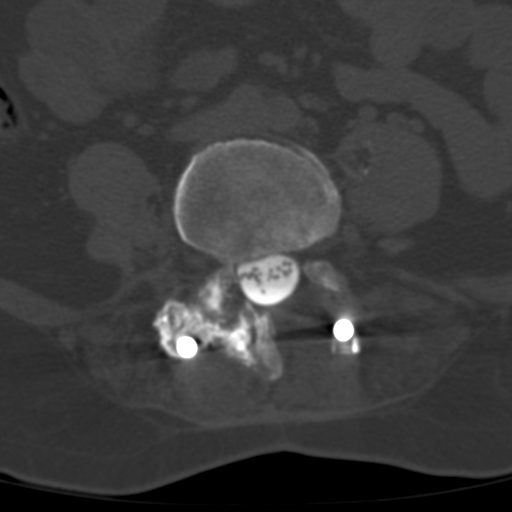
[im 40/79  bone]
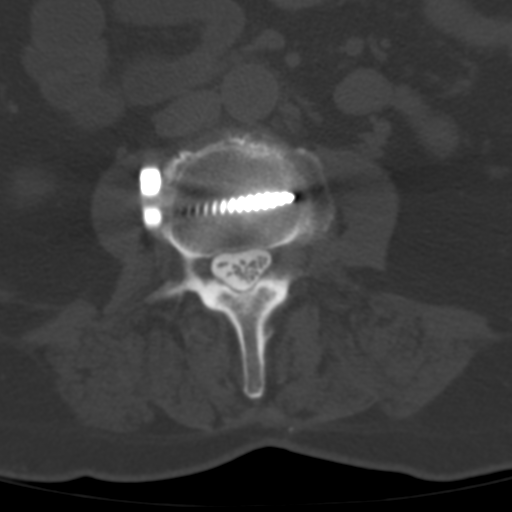
[im 53/79  bone]
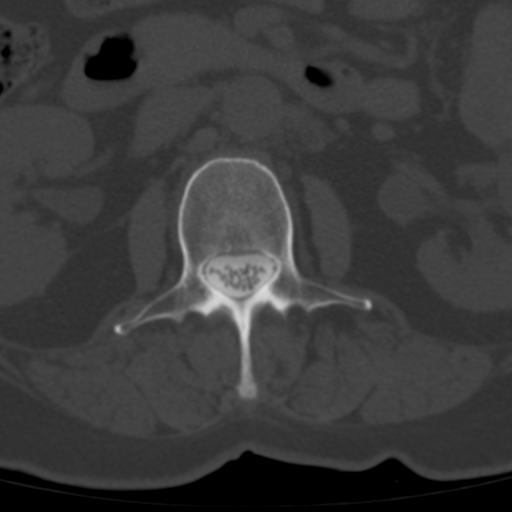
[im 66/79  soft-tissue]
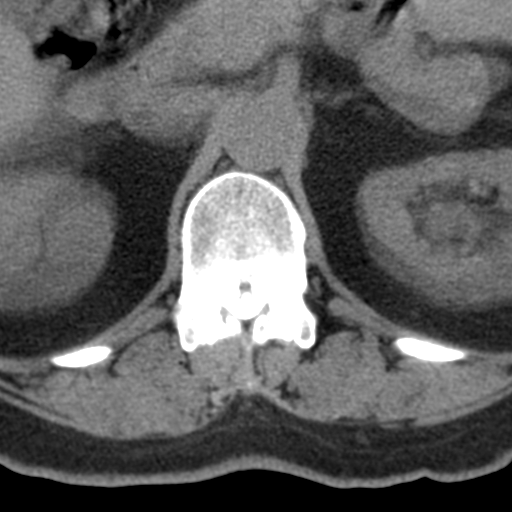
[im 66/79  bone]
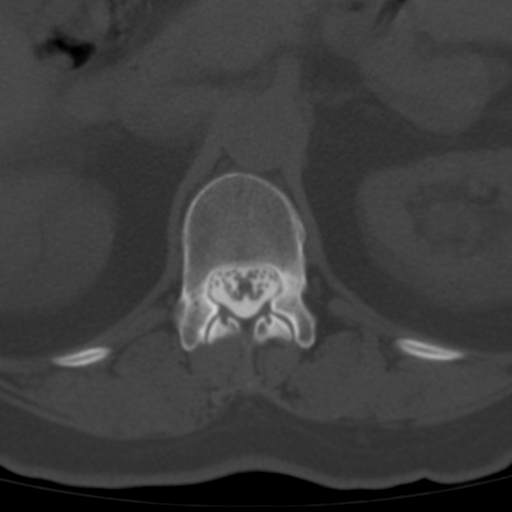

[5 of 14 positions shown; findings below may reference images not displayed]

FLUOROSCOPY TIME:  1 minutes 34 seconds. 420.12 micro gray meter
squared

PROCEDURE:
LUMBAR PUNCTURE FOR THORACIC AND LUMBAR MYELOGRAM

After thorough discussion of risks and benefits of the procedure
including bleeding, infection, injury to nerves, blood vessels,
adjacent structures as well as headache and CSF leak, written and
oral informed consent was obtained. Consent was obtained by Dr. Dohoon
Peterjan.

Patient was positioned prone on the fluoroscopy table. Local
anesthesia was provided with 1% lidocaine without epinephrine after
prepped and draped in the usual sterile fashion. Puncture was
performed at L2-3 using a 3 1/2 inch 22-gauge spinal needle via left
paramedian approach. Using a single pass through the dura, the
needle was placed within the thecal sac, with return of clear CSF.
10 mL of Isovue 0-ZZZ was injected into the thecal sac, with normal
opacification of the nerve roots and cauda equina consistent with
free flow within the subarachnoid space. The patient was then moved
to the trendelenburg position and contrast flowed into the Thoracic
spine region.

I personally performed the lumbar puncture and administered the
intrathecal contrast. I also personally performed acquisition of the
myelogram images.
FINDINGS: THORACIC AND LUMBAR MYELOGRAM FINDINGS:

Lumbar region: Previous lateral discectomy and fusion at L3-4.
Previous discectomy and posterior fusion from L4 to the sacrum. Wide
patency of the canal and foramina at the surgical levels.
Degenerative spondylosis at L2-3 with disc space narrowing. Moderate
anterior extradural defect. No abnormal motion with flexion and
extension. The patient does not generate much bending motion. Small
anterior extradural defect at L1-2. No abnormal motion at that
level.

Thoracic region: Thoracic spinal canal is widely patent. Small
anterior extradural defect suggesting mild non-compressive thoracic
disc bulges. No apparent compressive narrowing of the canal.

CT THORACIC MYELOGRAM FINDINGS:

Mild thoracic curvature convex to the right and thoracolumbar
curvature convex to the left. No antero or retrolisthesis. Cervical
fusion extends down as far as the T1 level with solid union.

T1-2: Bilateral facet hypertrophy with mild bilateral foraminal
stenosis. Mild bulging of the disc but no compressive central canal
stenosis.

T2-3 and T3-4: Normal

T4-5 through T7-8: Small, non-compressive disc bulges. Sufficient
patency of the canal and foramina. Mild facet osteoarthritis without
foraminal encroachment.

T8-9: Small endplate osteophytes. Small chronic disc protrusion.
Narrowing of the ventral subarachnoid space but no compressive
narrowing of the canal or foramina. Mild facet hypertrophy.

T9-10: Normal interspace.

T10-11: Chronic calcified central disc herniation with slight upward
migration behind inferior T10. Effacement of the ventral
subarachnoid space with slight indentation of the ventral cord.
There is ample subarachnoid space present dorsal to the cord,
therefore no cord compression. Foramina sufficiently patent.

T11-12 and T12-L1: Small noncompressive disc bulges.

CT LUMBAR MYELOGRAM FINDINGS:

L1-2: Minor disc bulge without neural compression.

L2-3: Disc degeneration with loss of disc height. Endplate
osteophytes and shallow protrusion of the disc more prominent to the
right of midline. Facet and ligamentous hypertrophy. Moderate
multifactorial stenosis at this level that could cause neural
compression in either lateral recess.

L3-4: Previous lateral discectomy and fusion. Solid union with
sufficient patency of the canal and foramina.

L4 to sacrum: Previous posterior decompression, diskectomy and
fusion. Solid union with sufficient patency of the canal and
foramina.

Bilateral sacroiliac osteoarthritis which could be painful.
IMPRESSION: 1. Lumbar region: Previous posterior decompression, diskectomy and
fusion from L4 to the sacrum. Solid union with sufficient patency of
the canal and foramina. Previous lateral discectomy and fusion L3-4.
Solid union with sufficient patency of the canal and foramina.
2. L2-3 disc degeneration with disc space narrowing. Endplate
osteophytes and shallow protrusion of the disc more prominent to the
right of midline. Facet and ligamentous hypertrophy. Moderate
multifactorial stenosis that could cause neural compression in
either lateral recess.
3. Thoracic region: T10-11: Chronic calcified central disc
herniation with slight upward migration behind T10. Effacement of
the ventral subarachnoid space with slight indentation of the
ventral cord but no cord compression, with subarachnoid space
present dorsal to the cord.
4. T1-2 facet hypertrophy with mild bilateral foraminal stenosis.

## 2022-09-02 ENCOUNTER — Ambulatory Visit
Admission: RE | Admit: 2022-09-02 | Discharge: 2022-09-02 | Disposition: A | Discharge status: Home / Self Care | Payer: Medicare PPO | Source: Ambulatory Visit | Attending: Sports Medicine | Admitting: Sports Medicine

## 2022-09-02 DIAGNOSIS — M25522 Pain in left elbow: Secondary | ICD-10-CM

## 2022-09-02 DIAGNOSIS — M7702 Medial epicondylitis, left elbow: Secondary | ICD-10-CM

## 2022-09-03 ENCOUNTER — Encounter: Payer: Self-pay | Admitting: Internal Medicine

## 2022-09-04 NOTE — Telephone Encounter (Signed)
From patient

## 2022-09-05 NOTE — Progress Notes (Unsigned)
Lindsey Rodriguez D.[place] Phone: [phone]   Assessment and Plan:     There are no diagnoses linked to this encounter.  ***   Pertinent previous records reviewed include ***   Follow Up: ***     Subjective:   I, Yulitza Shorts, am serving as a Education administrator for Doctor [name]   Chief Complaint: left elbow pain    HPI:    07/04/2022 Patient is a 57 year old female complaining of left elbow pain. Patient states that she has been in pain for about year , started as a tender spot not she has pain all the time, decreased ROM , deceased grip strength ,does have numbness and tingling occasionally, pain does not radiate, pain is on the inside of the elbow, ib and tylenol on occasion and that does not hep ,    07/25/2022 Patient states that above and below feel better but still TTP and it feels like a hot knife is going through the elbow    08/15/2022 Patient states when she doesn't use it no pain, but when she does use it she has pain , PT gave her a steriod? She wore it for 3 hours she has increased pain below he elbow    09/06/2022 Patient states    Relevant Historical Information: Hypertension  Additional pertinent review of systems negative.   Current Outpatient Medications:    buPROPion (WELLBUTRIN XL) 150 MG 24 hr tablet, Take by mouth., Disp: , Rfl:    carvedilol (COREG) 6.25 MG tablet, Take 1 tablet (6.25 mg total) by mouth 2 (two) times daily with a meal., Disp: 180 tablet, Rfl: 3   celecoxib (CELEBREX) 200 MG capsule, Take 1 capsule (200 mg total) by mouth 2 (two) times daily., Disp: 60 capsule, Rfl: 0   empagliflozin (JARDIANCE) 10 MG TABS tablet, Take 1 tablet (10 mg total) by mouth daily before breakfast., Disp: 30 tablet, Rfl: 6   EPINEPHrine 0.3 mg/0.3 mL IJ SOAJ injection, Inject 0.3 mg into the muscle as needed for anaphylaxis., Disp: 1 each, Rfl: 0   ezetimibe (ZETIA) 10 MG tablet,  Take 1 tablet (10 mg total) by mouth daily., Disp: 90 tablet, Rfl: 3   FLUoxetine (PROZAC) 40 MG capsule, Take 2 capsules by mouth daily., Disp: , Rfl:    LORazepam (ATIVAN) 0.5 MG tablet, Take 1-2 tablets (0.5-1 mg total) by mouth 3 (three) times daily as needed for anxiety. Prn anxiety, Disp: 30 tablet, Rfl: 0   methylPREDNISolone (MEDROL DOSEPAK) 4 MG TBPK tablet, Take 6 tablets on day 1.  Take 5 tablets on day 2.  Take 4 tablets on day 3.  Take 3 tablets on day 4.  Take 2 tablets on day 5.  Take 1 tablet on day 6., Disp: 21 tablet, Rfl: 0   patiromer (VELTASSA) 8.4 g packet, Take 1 packet (8.4 g total) by mouth daily., Disp: 30 each, Rfl: 6   pravastatin (PRAVACHOL) 40 MG tablet, Take 1 tablet (40 mg total) by mouth daily., Disp: 90 tablet, Rfl: 3   propranolol (INDERAL) 20 MG tablet, Take 20 mg by mouth daily as needed., Disp: , Rfl:    sacubitril-valsartan (ENTRESTO) 24-26 MG, Take 1 tablet by mouth 2 (two) times daily., Disp: 60 tablet, Rfl: 6   spironolactone (ALDACTONE) 25 MG tablet, Take 0.5 tablets (12.5 mg total) by mouth daily., Disp: 30 tablet, Rfl: 6   tiZANidine (ZANAFLEX) 2 MG tablet, Take 1  tablet (2 mg total) by mouth every 6 (six) hours as needed for muscle spasms., Disp: 30 tablet, Rfl: 11   Objective:     There were no vitals filed for this visit.    There is no height or weight on file to calculate BMI.    Physical Exam:    ***   Electronically signed by:  Lindsey Rodriguez D.[name] 11:43 AM 09/05/22

## 2022-09-06 ENCOUNTER — Ambulatory Visit: Payer: Medicare PPO | Admitting: Sports Medicine

## 2022-09-06 VITALS — BP 118/84 | Ht 66.0 in | Wt 198.0 lb

## 2022-09-06 DIAGNOSIS — M25522 Pain in left elbow: Secondary | ICD-10-CM | POA: Diagnosis not present

## 2022-09-06 DIAGNOSIS — M7702 Medial epicondylitis, left elbow: Secondary | ICD-10-CM

## 2022-09-06 MED ORDER — MELOXICAM 15 MG PO TABS: 15.0000 mg | ORAL_TABLET | Freq: Every day | ORAL | 0 refills | Status: DC

## 2022-09-06 NOTE — Patient Instructions (Addendum)
Good to see you - Start meloxicam 15 mg daily x2 weeks.  If still having pain after 2 weeks, complete 3rd-week of meloxicam. May use remaining meloxicam as needed once daily for pain control.  Do not to use additional NSAIDs while taking meloxicam.  May use Tylenol [phone] mg 2 to 3 times a day for breakthrough pain. Continue wrist brace Continue PT and ask them about dry needling 3-4 week follow up if yu decide you want a PRP injection call use before your visit and let us know

## 2022-09-13 ENCOUNTER — Ambulatory Visit: Payer: Medicare PPO | Admitting: Family Medicine

## 2022-09-26 ENCOUNTER — Ambulatory Visit: Payer: Medicare PPO | Admitting: Internal Medicine

## 2022-09-26 ENCOUNTER — Encounter: Payer: Self-pay | Admitting: Internal Medicine

## 2022-09-26 VITALS — BP 139/83 | HR 71 | Ht 66.0 in | Wt 202.0 lb

## 2022-09-26 DIAGNOSIS — I428 Other cardiomyopathies: Secondary | ICD-10-CM

## 2022-09-26 DIAGNOSIS — I10 Essential (primary) hypertension: Secondary | ICD-10-CM

## 2022-09-26 DIAGNOSIS — E7800 Pure hypercholesterolemia, unspecified: Secondary | ICD-10-CM

## 2022-09-26 MED ORDER — CARVEDILOL 3.125 MG PO TABS: 3.1250 mg | ORAL_TABLET | Freq: Two times a day (BID) | ORAL | 10 refills | Status: DC

## 2022-09-26 NOTE — Progress Notes (Signed)
Primary Physician/Referring:  Rita Ohara, MD  Patient ID: Lindsey Rodriguez, female    DOB: September 25, 1965, 57 y.o.   MRN: [id number]  Chief Complaint  Patient presents with   Nonischemic cardiomyopathy   Follow-up   Shortness of Breath    HPI:    Lindsey Rodriguez  is a 57 y.o.  Caucasian female, with frequent PVCs and  new onset left bundle branch block and non ischemic cardiomyopathy diagnosed in 2017 with severe LV systolic dysfunction, EF has improved with medical therapy to around 40 to 45%.  She has hypertension and hyperlipidemia.  She has also been diagnosed with IgG lambda MGUS, small fiber neuropathy,  and severe degenerative L5S1 spine disease. She underwent L4-S1 fusion in January 2022.    Patient presents for follow-up visit. Patient is still complaining of feeling tired all of the time. She says she has no energy and she is sick of feeling tired. She feels like she could sleep 20 out of 24 hours that is how tired she feels. Her daughter is coming to visit from [place] in May and she really wants to have some energy back to enjoy the time with her daughter. Denies chest pain, shortness of breath, palpitations, diaphoresis, syncope, edema, PND, orthopnea.   Past Medical History:  Diagnosis Date   Anemia    Anxiety panic attacks   [name]   Arthritis    knees   Cardiomyopathy, nonischemic ([place]) 04/07/2016   EF left ventricule calculated on Lexiscan stress test at 33% with global hypokinesis.    Chronic back pain    Chronic neck pain    COVID-19 06/2021   treated with Paxlovid through [facility] in [place]    pvc's with cardiomyopathy   Family history of adverse reaction to anesthesia    mother has problems waking up   Fibromyalgia    and neuropathy of feet   History of kidney stones    lithotripsy  03/2016   Hyperlipidemia    Hypertension    Kidney stone    History   Kidney stones h/o   Melanoma ([place]) RUE   [name]   MGUS  (monoclonal gammopathy of unknown significance) 06/2015   no treatment just watching recheck blood work 12/2015   Migraine    OTC med prn - stress related   Plantar fasciitis right   Renal stones 2008/2011/2017   Seasonal allergies    SVD (spontaneous vaginal delivery)    x 3   Tendonitis of foot right   Past Surgical History:  Procedure Laterality Date   ABLATION ON ENDOMETRIOSIS N/A 11/24/2015   Procedure: ABLATION ON ENDOMETRIOSIS;  Surgeon: [name], MD;  Location: [facility];  Service: Gynecology;  Laterality: N/A;   ANTERIOR LAT LUMBAR FUSION Right 06/15/2016   Procedure: RIGHT LUMBAR THREE-FOUR ANTEROLATERAL LUMBAR INTERBODY FUSION WITH LATERAL PLATE;  Surgeon: Erline Levine, MD;  Location: [facility];  Service: Neurosurgery;  Laterality: Right;   BACK SURGERY  Dr.Stern, last sx Summer 2011   X 4  (L3-S1)   BACK SURGERY  06/2016   BACK SURGERY  07/20/2020   L2-3 lateral interbody (Left side approach), L2-3 Posterior Spinal Fusion at [facility] N/A 11/24/2015   Procedure: TRANSVAGINAL TAPE (TVT) PROCEDURE;  Surgeon: [name], MD;  Location: [facility];  Service: Gynecology;  Laterality: N/A;   C2-3 nerve ablation Right 06/2019   Dr. Maia Petties (per pt)   COLONOSCOPY  CYSTOCELE REPAIR  11/24/2015   Procedure: ANTERIOR REPAIR (CYSTOCELE);  Surgeon: [name], MD;  Location: [facility];  Service: Gynecology;;   CYSTOSCOPY N/A 11/24/2015   Procedure: Consuela Mimes;  Surgeon: [name], MD;  Location: [facility];  Service: Gynecology;  Laterality: N/A;   DILATION AND CURETTAGE OF UTERUS     ENDOMETRIAL ABLATION  08/2000   EYE SURGERY Bilateral    Lasik    KIDNEY STONE SURGERY  2011   LITHOTRIPSY  08/2017 (L), 03/2018 (R)   MELANOMA EXCISION  05/2015   right forearm; [name]   NECK SURGERY  [name]  Summer 2011   X 4  (C4-T1 Level)   RHINOPLASTY     ROBOTIC ASSISTED BILATERAL SALPINGO OOPHERECTOMY Bilateral 11/24/2015   Procedure: ROBOTIC ASSISTED  BILATERAL SALPINGO OOPHORECTOMY;  Surgeon: [name], MD;  Location: [facility];  Service: Gynecology;  Laterality: Bilateral;--OVARIES NOT REMOVED PER PATH REPORT   ROBOTIC ASSISTED TOTAL HYSTERECTOMY WITH SALPINGECTOMY Bilateral 11/24/2015   Procedure: ROBOTIC ASSISTED TOTAL HYSTERECTOMY WITH SALPINGECTOMY;  Surgeon: [name], MD;  Location: [facility];  Service: Gynecology;  Laterality: Bilateral;   TONSILLECTOMY     TOTAL VAGINAL HYSTERECTOMY     WISDOM TOOTH EXTRACTION     Family History  Problem Relation Age of Onset   Hypertension Mother    Depression Mother    Migraines Mother    Hypothyroidism Mother    Hypercholesterolemia Mother    COPD Father    Heart failure Father 37   Healthy Brother    Cardiomyopathy Daughter 40       related to COVID (illness or vaccine?)   Healthy Daughter    Healthy Daughter    Healthy Son    Diabetes Neg Hx    Social History   Tobacco Use   Smoking status: Former    Packs/day: 0.10    Years: 3.00    Additional pack years: 0.00    Total pack years: 0.30    Types: Cigarettes    Quit date: 07/04/1987    Years since quitting: 35.2   Smokeless tobacco: Never  Substance Use Topics   Alcohol use: Yes    Comment: 4/week   Marital Status: Married   ROS  Review of Systems  Constitutional: Positive for malaise/fatigue. Negative for weight gain.  Cardiovascular:  Negative for chest pain (no recurrence), claudication, dyspnea on exertion, leg swelling, near-syncope, orthopnea, palpitations, paroxysmal nocturnal dyspnea and syncope.  Neurological:  Negative for dizziness.   Objective      09/26/2022    8:55 AM 09/06/2022   11:31 AM 08/15/2022   10:14 AM  Vitals with BMI  Height 5\' 6"  5\' 6"  5\' 6"   Weight 202 lbs 198 lbs 201 lbs  BMI 32.62 [id number]  Systolic [id number]  Diastolic 83 84 82  Pulse 71  63    Orthostatic VS for the past 72 hrs (Last 3 readings):  Patient Position BP Location Cuff Size  09/26/22 0855 Sitting Left  Arm Small    Physical Exam Vitals reviewed.  Constitutional:      Appearance: She is well-developed. She is obese.  Neck:     Vascular: No carotid bruit.  Cardiovascular:     Rate and Rhythm: Normal rate and regular rhythm.     Pulses: Intact distal pulses.     Heart sounds: Murmur heard.     Midsystolic murmur is present with a grade of 2/6 radiating to the apex.     No gallop.  Comments: No JVD. Pulmonary:     Effort: Pulmonary effort is normal.     Breath sounds: Normal breath sounds.  Musculoskeletal:     Cervical back: Normal range of motion.     Right lower leg: No edema.     Left lower leg: No edema.  Neurological:     Mental Status: She is alert.   Previous office visit.Physical exam unchanged compared  Laboratory examination:   Recent Labs    04/06/22 0807 04/26/22 1008 06/23/22 0802 07/11/22 0810  NA 137  --  137 137  K 4.4  --  5.3* 4.9  CL 105  --  104 102  CO2 28  --  21 20  GLUCOSE 108* 90 108* 117*  BUN 12  --  18 17  CREATININE 0.79  --  0.93 0.86  CALCIUM 9.5  --  9.7 9.6  GFRNONAA >60  --   --   --    CrCl cannot be calculated (Patient's most recent lab result is older than the maximum 21 days allowed.).     Latest Ref Rng & Units 07/11/2022    8:10 AM 06/23/2022    8:02 AM 04/26/2022   10:08 AM  CMP  Glucose 70 - 99 mg/dL 117  108  90   BUN 6 - 24 mg/dL 17  18    Creatinine 0.57 - 1.00 mg/dL 0.86  0.93    Sodium 134 - 144 mmol/L 137  137    Potassium 3.5 - 5.2 mmol/L 4.9  5.3    Chloride 96 - 106 mmol/L 102  104    CO2 20 - 29 mmol/L 20  21    Calcium 8.7 - 10.2 mg/dL 9.6  9.7        Latest Ref Rng & Units 04/06/2022    9:35 AM 09/07/2021   12:20 PM 03/08/2021   10:12 AM  CBC  WBC 4.0 - 10.5 K/uL 4.1  3.3  4.6   Hemoglobin 12.0 - 15.0 g/dL 13.5  12.8  13.0   Hematocrit 36.0 - 46.0 % 38.5  38.8  38.8   Platelets 150 - 400 K/uL 202  237  284    Lipid Panel Recent Labs    10/25/21 0812 04/26/22 1008  CHOL 166 241*  TRIG 200*  237*  LDLCALC 86 158*  HDL 46 39*  CHOLHDL  --  6.2*    HEMOGLOBIN A1C Lab Results  Component Value Date   HGBA1C 5.5 01/27/2013   TSH No results for input(s): "TSH" in the last 8760 hours.   Allergies   Allergies  Allergen Reactions   Penicillins Shortness Of Breath and Rash    Has patient had a PCN reaction causing immediate rash, facial/tongue/throat swelling, SOB or lightheadedness with hypotension: no Has patient had a PCN reaction causing severe rash involving mucus membranes or skin necrosis: no Has patient had a PCN reaction that required hospitalization no Has patient had a PCN reaction occurring within the last 10 years: no If all of the above answers are "NO", then may proceed with Cephalosporin use.  Other reaction(s): Unknown   Codeine Other (See Comments)    Manic depressive Other reaction(s): Unknown   Voltaren [Diclofenac] Nausea And Vomiting and Other (See Comments)    Pt stated "tore stomach up"   Crestor [Rosuvastatin] Other (See Comments)    Myalgia   Diclofenac Sodium    Vytorin [Ezetimibe-Simvastatin] Other (See Comments)    Severe myalgia   Erythromycin Rash  Other reaction(s): Unknown    Medications Prior to Visit:   Outpatient Medications Prior to Visit  Medication Sig Dispense Refill   buPROPion (WELLBUTRIN XL) 150 MG 24 hr tablet Take by mouth.     empagliflozin (JARDIANCE) 10 MG TABS tablet Take 1 tablet (10 mg total) by mouth daily before breakfast. 30 tablet 6   FLUoxetine (PROZAC) 40 MG capsule Take 2 capsules by mouth daily.     loratadine (CLARITIN) 10 MG tablet Take 10 mg by mouth daily.     LORazepam (ATIVAN) 0.5 MG tablet Take 1-2 tablets (0.5-1 mg total) by mouth 3 (three) times daily as needed for anxiety. Prn anxiety 30 tablet 0   pravastatin (PRAVACHOL) 40 MG tablet Take 1 tablet (40 mg total) by mouth daily. 90 tablet 3   sacubitril-valsartan (ENTRESTO) 24-26 MG Take 1 tablet by mouth 2 (two) times daily. 60 tablet 6    tiZANidine (ZANAFLEX) 2 MG tablet Take 1 tablet (2 mg total) by mouth every 6 (six) hours as needed for muscle spasms. 30 tablet 11   carvedilol (COREG) 6.25 MG tablet Take 1 tablet (6.25 mg total) by mouth 2 (two) times daily with a meal. 180 tablet 3   ezetimibe (ZETIA) 10 MG tablet Take 1 tablet (10 mg total) by mouth daily. 90 tablet 3   patiromer (VELTASSA) 8.4 g packet Take 1 packet (8.4 g total) by mouth daily. 30 each 6   spironolactone (ALDACTONE) 25 MG tablet Take 0.5 tablets (12.5 mg total) by mouth daily. 30 tablet 6   EPINEPHrine 0.3 mg/0.3 mL IJ SOAJ injection Inject 0.3 mg into the muscle as needed for anaphylaxis. 1 each 0   ezetimibe (ZETIA) 10 MG tablet Take 10 mg by mouth daily.     celecoxib (CELEBREX) 200 MG capsule Take 1 capsule (200 mg total) by mouth 2 (two) times daily. 60 capsule 0   meloxicam (MOBIC) 15 MG tablet Take 1 tablet (15 mg total) by mouth daily. (Patient not taking: Reported on 09/26/2022) 30 tablet 0   methylPREDNISolone (MEDROL DOSEPAK) 4 MG TBPK tablet Take 6 tablets on day 1.  Take 5 tablets on day 2.  Take 4 tablets on day 3.  Take 3 tablets on day 4.  Take 2 tablets on day 5.  Take 1 tablet on day 6. (Patient not taking: Reported on 09/26/2022) 21 tablet 0   propranolol (INDERAL) 20 MG tablet Take 20 mg by mouth daily as needed. (Patient not taking: Reported on 09/26/2022)     No facility-administered medications prior to visit.   Final Medications at End of Visit    Current Meds  Medication Sig   buPROPion (WELLBUTRIN XL) 150 MG 24 hr tablet Take by mouth.   carvedilol (COREG) 3.125 MG tablet Take 1 tablet (3.125 mg total) by mouth 2 (two) times daily with a meal.   empagliflozin (JARDIANCE) 10 MG TABS tablet Take 1 tablet (10 mg total) by mouth daily before breakfast.   FLUoxetine (PROZAC) 40 MG capsule Take 2 capsules by mouth daily.   loratadine (CLARITIN) 10 MG tablet Take 10 mg by mouth daily.   LORazepam (ATIVAN) 0.5 MG tablet Take 1-2 tablets  (0.5-1 mg total) by mouth 3 (three) times daily as needed for anxiety. Prn anxiety   pravastatin (PRAVACHOL) 40 MG tablet Take 1 tablet (40 mg total) by mouth daily.   sacubitril-valsartan (ENTRESTO) 24-26 MG Take 1 tablet by mouth 2 (two) times daily.   tiZANidine (ZANAFLEX) 2 MG tablet Take 1  tablet (2 mg total) by mouth every 6 (six) hours as needed for muscle spasms.   [DISCONTINUED] carvedilol (COREG) 6.25 MG tablet Take 1 tablet (6.25 mg total) by mouth 2 (two) times daily with a meal.   [DISCONTINUED] ezetimibe (ZETIA) 10 MG tablet Take 1 tablet (10 mg total) by mouth daily.   [DISCONTINUED] patiromer (VELTASSA) 8.4 g packet Take 1 packet (8.4 g total) by mouth daily.   [DISCONTINUED] spironolactone (ALDACTONE) 25 MG tablet Take 0.5 tablets (12.5 mg total) by mouth daily.   Radiology:  No results found.  Cardiac Studies:   Holter monitor 06/06/2017: Sinus rhythm.  Minimum heart rate 54 bpm, average heart rate 71 bpm, maximum heart rate 102 bpm.  Total ventricular ectopy 3.3%.  Less than 1% supraventricular ectopy  No atrial fibrillation, atrial flutter, ventricular tachycardia.  Echocardiogram 10/04/2020: Left ventricle cavity is normal in size and ventricular wall thickness. Abnormal septal wall motion due to left bundle branch block and moderate global hypokinesis. Cannot exclude apical thrombus. LVEF 35-40%. Grade 1 diastolic function, normal left atrial pressure.  Calculated EF 58%. Left atrial cavity is moderately dilated. Mild (grade 1) mitral regurgitation.  No evidence of pulmonary hypertension. No significant change compared to previous study on 12/30/2019.   Exercise Tetrofosmin stress test 09/21/2021: Exercise nuclear stress test was performed using Bruce protocol. Patient reached 7.6 METS, and 88% of age predicted maximum heart rate. Exercise capacity was low. No chest pain reported. Normal heart rate response. Resting hypertension 150/100 mmHg, hypertensive response 210/90  mmHg. Stress EKG inconclusive given sinus tachycardia, LBBB.  SPECT stress images show mildly decrease tracer uptake in basal anteroseptal myocardium, which could be due to LBBB.  Paradoxical wall motion and thickening due to LBBB. Stress LVEF 36%. High risk study due to low LVEF. Inconclusive for ischemia evaluation given LBBB. Consider pharmacological stress test or coronary angiography, if clinically indicated.   Echocardiogram 12/06/2021:  Left ventricle cavity is normal in size. Abnormal septal wall motion due  to left bundle branch block. Moderate global hypokinesis. LVEF 35-40%.  Indeterminate diastolic filling pattern.  Left atrial cavity is normal in size.  Mild tricuspid regurgitation.  No evidence of pulmonary hypertension.  Previous study on 10/04/2020 reported mod LA dilatation, grade 1 diastolic  dysfunction. No other significant change noted.   EKG   12/06/2021: normal sinus rhythm at a rate up 67 bpm. Left axis. LBBB, no further analysis. Compared to EKG 05/23/2021, no significant change.   06/14/2022: normal sinus, left axis. LBBB. No change compared to prior  Assessment     ICD-10-CM   1. Hypercholesteremia  E78.00 Lipid Panel With LDL/HDL Ratio    2. Nonischemic cardiomyopathy (HCC)  I42.8 PCV ECHOCARDIOGRAM COMPLETE    3. Essential hypertension, benign  I10       Meds ordered this encounter  Medications   carvedilol (COREG) 3.125 MG tablet    Sig: Take 1 tablet (3.125 mg total) by mouth 2 (two) times daily with a meal.    Dispense:  60 tablet    Refill:  10   Meds ordered this encounter  Medications   carvedilol (COREG) 3.125 MG tablet    Sig: Take 1 tablet (3.125 mg total) by mouth 2 (two) times daily with a meal.    Dispense:  60 tablet    Refill:  10    Recommendations:   Lindsey Rodriguez  is a 57 y.o. Caucasian female, with frequent PVCs and  new onset left bundle branch block and non  ischemic cardiomyopathy diagnosed in 2017 with severe LV  systolic dysfunction, EF has improved with medical therapy to around 40 to 45%.  She has hypertension and hyperlipidemia.  She has also been diagnosed with IgG lambda MGUS, small fiber neuropathy, anxiety and depression, panic attacks and severe degenerative L5S1 spine disease and also fibromyalgia. She underwent L4-S1 fusion in January 2022.   Essential hypertension, benign Continue current cardiac medications. Must be on coreg or toprol for cardiomyopathy - propranolol is ok to take PRN for social anxiety I will cut her coreg in half as she feels so tired and worn out all of the time. She denies orthopnea, edema, PND. Encourage low-sodium diet, less than 2000 mg daily. Patient cannot tolerate veltassa so I will stop her spironolactone given history of hyperkalemia   Nonischemic cardiomyopathy EF 35-40% Continue Jardiance, coreg, and entresto Will add veltassa for hyperkalemia Repeat echocardiogram ordered   Hyperlipidemia Patient tolerating pravastatin and zetia She has not been able to tolerate any other lipid lowering medication Will repeat lipid panel today  Follow-up in 3 months, sooner if needed.    Floydene Flock, DO, [facility] 09/26/2022, 11:12 AM Office: [phone]

## 2022-09-29 ENCOUNTER — Encounter: Payer: Self-pay | Admitting: Sports Medicine

## 2022-10-02 NOTE — Progress Notes (Unsigned)
    Lindsey Rodriguez Phone: [phone]   Assessment and Plan:     There are no diagnoses linked to this encounter.  ***   Pertinent previous records reviewed include ***   Follow Up: ***     Subjective:   I, Lindsey Rodriguez, am serving as a Education administrator for Doctor [name]   Chief Complaint: left elbow pain    HPI:    07/04/2022 Patient is a 57 year old female complaining of left elbow pain. Patient states that she has been in pain for about year , started as a tender spot not she has pain all the time, decreased ROM , deceased grip strength ,does have numbness and tingling occasionally, pain does not radiate, pain is on the inside of the elbow, ib and tylenol on occasion and that does not hep ,    07/25/2022 Patient states that above and below feel better but still TTP and it feels like a hot knife is going through the elbow    08/15/2022 Patient states when she doesn't use it no pain, but when she does use it she has pain , PT gave her a steriod? She wore it for 3 hours she has increased pain below he elbow    09/06/2022 Patient states she is better but it is just not right , not healing as fast as she would like    10/03/2022 Patient states   Relevant Historical Information: Hypertension Additional pertinent review of systems negative.   Current Outpatient Medications:    buPROPion (WELLBUTRIN XL) 150 MG 24 hr tablet, Take by mouth., Disp: , Rfl:    carvedilol (COREG) 3.125 MG tablet, Take 1 tablet (3.125 mg total) by mouth 2 (two) times daily with a meal., Disp: 60 tablet, Rfl: 10   empagliflozin (JARDIANCE) 10 MG TABS tablet, Take 1 tablet (10 mg total) by mouth daily before breakfast., Disp: 30 tablet, Rfl: 6   EPINEPHrine 0.3 mg/0.3 mL IJ SOAJ injection, Inject 0.3 mg into the muscle as needed for anaphylaxis., Disp: 1 each, Rfl: 0   ezetimibe (ZETIA) 10 MG tablet, Take 10 mg by mouth  daily., Disp: , Rfl:    FLUoxetine (PROZAC) 40 MG capsule, Take 2 capsules by mouth daily., Disp: , Rfl:    loratadine (CLARITIN) 10 MG tablet, Take 10 mg by mouth daily., Disp: , Rfl:    LORazepam (ATIVAN) 0.5 MG tablet, Take 1-2 tablets (0.5-1 mg total) by mouth 3 (three) times daily as needed for anxiety. Prn anxiety, Disp: 30 tablet, Rfl: 0   pravastatin (PRAVACHOL) 40 MG tablet, Take 1 tablet (40 mg total) by mouth daily., Disp: 90 tablet, Rfl: 3   sacubitril-valsartan (ENTRESTO) 24-26 MG, Take 1 tablet by mouth 2 (two) times daily., Disp: 60 tablet, Rfl: 6   tiZANidine (ZANAFLEX) 2 MG tablet, Take 1 tablet (2 mg total) by mouth every 6 (six) hours as needed for muscle spasms., Disp: 30 tablet, Rfl: 11   Objective:     There were no vitals filed for this visit.    There is no height or weight on file to calculate BMI.    Physical Exam:    ***   Electronically signed by:  Lindsey Mccreedy D.[name] 7:21 AM 10/02/22

## 2022-10-03 ENCOUNTER — Other Ambulatory Visit: Payer: Self-pay

## 2022-10-03 ENCOUNTER — Ambulatory Visit (INDEPENDENT_AMBULATORY_CARE_PROVIDER_SITE_OTHER): Payer: Self-pay | Admitting: Sports Medicine

## 2022-10-03 DIAGNOSIS — M25522 Pain in left elbow: Secondary | ICD-10-CM | POA: Diagnosis not present

## 2022-10-03 DIAGNOSIS — M7702 Medial epicondylitis, left elbow: Secondary | ICD-10-CM

## 2022-10-20 ENCOUNTER — Other Ambulatory Visit: Payer: Self-pay

## 2022-10-20 DIAGNOSIS — D472 Monoclonal gammopathy: Secondary | ICD-10-CM

## 2022-10-23 ENCOUNTER — Ambulatory Visit: Payer: Medicare PPO

## 2022-10-23 ENCOUNTER — Inpatient Hospital Stay: Payer: Medicare PPO | Attending: Nurse Practitioner

## 2022-10-23 DIAGNOSIS — D472 Monoclonal gammopathy: Secondary | ICD-10-CM

## 2022-10-23 LAB — CMP (CANCER CENTER ONLY)
ALT: 49 U/L — ABNORMAL HIGH (ref 0–44)
AST: 40 U/L (ref 15–41)
Albumin: 4.9 g/dL (ref 3.5–5.0)
Alkaline Phosphatase: 72 U/L (ref 38–126)
Anion gap: 7 (ref 5–15)
BUN: 12 mg/dL (ref 6–20)
CO2: 27 mmol/L (ref 22–32)
Calcium: 10.3 mg/dL (ref 8.9–10.3)
Chloride: 104 mmol/L (ref 98–111)
Creatinine: 0.81 mg/dL (ref 0.44–1.00)
GFR, Estimated: >60 mL/min (ref >60)
Glucose, Bld: 91 mg/dL (ref 70–99)
Potassium: 4.1 mmol/L (ref 3.5–5.1)
Sodium: 138 mmol/L (ref 135–145)
Total Bilirubin: 0.4 mg/dL (ref 0.3–1.2)
Total Protein: 8.1 g/dL (ref 6.5–8.1)

## 2022-10-23 LAB — CBC WITH DIFFERENTIAL (CANCER CENTER ONLY)
Abs Immature Granulocytes: 0.02 10*3/uL (ref 0.00–0.07)
Basophils Absolute: 0 10*3/uL (ref 0.0–0.1)
Basophils Relative: 1 %
Eosinophils Absolute: 0.1 10*3/uL (ref 0.0–0.5)
Eosinophils Relative: 1 %
HCT: 39.8 % (ref 36.0–46.0)
Hemoglobin: 14 g/dL (ref 12.0–15.0)
Immature Granulocytes: 1 %
Lymphocytes Relative: 46 %
Lymphs Abs: 2 10*3/uL (ref 0.7–4.0)
MCH: 35.4 pg — ABNORMAL HIGH (ref 26.0–34.0)
MCHC: 35.2 g/dL (ref 30.0–36.0)
MCV: 100.8 fL — ABNORMAL HIGH (ref 80.0–100.0)
Monocytes Absolute: 0.4 10*3/uL (ref 0.1–1.0)
Monocytes Relative: 9 %
Neutro Abs: 1.8 10*3/uL (ref 1.7–7.7)
Neutrophils Relative %: 42 %
Platelet Count: 226 10*3/uL (ref 150–400)
RBC: 3.95 MIL/uL (ref 3.87–5.11)
RDW: 12.4 % (ref 11.5–15.5)
WBC Count: 4.3 10*3/uL (ref 4.0–10.5)
nRBC: 0 % (ref 0.0–0.2)

## 2022-10-25 ENCOUNTER — Inpatient Hospital Stay: Payer: Medicare PPO | Admitting: Hematology

## 2022-10-25 ENCOUNTER — Telehealth: Payer: Self-pay | Admitting: Hematology

## 2022-10-25 ENCOUNTER — Encounter: Payer: Self-pay | Admitting: Hematology

## 2022-10-25 NOTE — Progress Notes (Signed)
NEUROLOGY FOLLOW UP OFFICE NOTE  Lindsey Rodriguez [id number]  Assessment/Plan:   1.  Migraine without aura, without status migrainosus, not intractable 2.  Occipital neuralgia/cervicogenic headache, stable 3.  Tension-type headache, not intractable 4.  Small fiber neuropathy, idiopathic - some progression   1.  Tizanidine as needed for headache  2.   Continue monitoring neuropathy.  Once ready to treat neuralgia, I would refer to pain management has she already has tried and failed/had side effects to the medications I typically prescribe.  She will also monitor her gait.  If she feels her balance becomes worse, she will contact me and we can refer to physical therapy 3.   Follow up one year or as needed.    Subjective:  Lindsey Rodriguez is a 57 year old right-handed female with chronic neck and back pain with neuropathy, fibromyalgia, cardiomyopathy, MGUS, hypertension, plantar fasciitis, and history of kidney sones, depression and lumbosacral radiculopathy who follows up for migraines and small fiber neuropathy   UPDATE: Headaches are overall controlled, especially considering current seasonal allergies.  She has about 6 headaches a month now, 2-3 are migraines and the rest are tension-type or sinus headaches.  Treats migraines with Extra-strength Tylenol, tizanidine for tension type headache.  Neuropathy has gotten worse.  Her big toes feel more numb.  Also increased tingling in the toes up to just above the ankle.  Usually no associated pain but sometimes her feet, particularly the right foot, will burn.  She would like to hold off on treatment for neuralgia until her golfer's elbow pain is managed. She has been stumbling more frequently.  Sometimes her toes will get caught on the floor.  Mainly happens on uneven ground.       Current NSAIDS: none Current analgesics:  Extra-strength Tylenol Current triptans:  no Current ergotamine:  no Current anti-emetic:  no Current muscle  relaxants:  Tizanidine 4mg  PRN (also for neck stiffness) Current anti-anxiolytic:  lorazepam Current sleep aide:  no Current Antihypertensive medications:  Coreg Current Antidepressant medications:  Wellbutrin, fluoxetine 80mg  Current Anticonvulsant medications:  none Current anti-CGRP:  None Current Vitamins/Herbal/Supplements:  CoQ10 30mg ; 65 Fe Current Antihistamines/Decongestants:  Tylenol Allergy Sinus Other therapy:  no   Depression:  yes; Anxiety:  yes Other pain:  Chronic low back pain - lumbar disc disease, small fiber neuropathy, left golfer's elbow     HISTORY: Onset:  Childhood Location:  Varies (back of head, crown, bi-temporal, retro-orbital, band-like) Quality:  Retro-orbital throbbing, band-like vice, stabbing at back of head Initial Intensity:  4/10 constant, 8/10 when severe; August: Tension 6-9/10; Migraine 9/10 Aura:  no Prodrome:  no Associated symptoms:  Nausea, photophobia, phonophobia, tunnel vision.  No associated vomiting or unilateral numbness or weakness. Initial Duration:  Constant but severe episodes 8 hours (with sumatriptan and naproxen); August: Tension to 3 hours; Migraine until goes to sleep Initial Frequency:  Daily but severe episodes occur once a week; August: Tension 3 days per week; Migraine once every 2 weeks) Triggers/aggravating factors:  Emotional stress, working at the computer; seasonal allergies. Relieving factors:  Ice pack and laying in dark room Activity:  Cannot function at least once a week.   She has chronic neck pain status post C4-T2 fusion.  Last MRI of cervical spine from 11/26/14 showed progression of degenerative disc disease at C3-4 with left paracentral protrusion contacts and deforms the left hemicord.   Past NSAIDs:  Naproxen; ibuprofen Past analgesics:  Tramadol, lidocaine nasal drops Past triptans:  Relpax  40mg , Treximet, Maxalt, Zomig no, Zomig NS, sumatriptan 100mg , sumatriptan NS, Onzetra Xsail Past  antihypertensives:  Propranolol (caused hypotension), amlodipine, lisinopril Past antidepressants:  Cymbalta, nortriptyline, venlafaxine, sertraline Past antiepileptics:  Topiramate, zonisamide, Lyrica (cognitive difficulties), Depakote, gabapentin (sleepiness). Past anti-CGRP:  Aimovig, Emgality (could not afford) Other past therapy:  biofeedback   She has known chronic neck pain status post C4-T2 fusion with degenerative disc disease of the cervical spine.  Due to worsening neck pain, MRI of cervical spine was performed on 12/21/2018 which demonstrated multilevel degenrative changes with mild facet disease at C2-3 and C3-4, post-surgical C4-T2 fusion with chronic deformity of right hemicord at C4-5, central and leftward protrusion at C3-4 with partial regression of disc herniation (since 2016) and slightly greater anterolisthesis related to facet arthropathy with slight left-sided cord flattening and slight left C4 foraminal narrowing.  She was referred to Dr. Lorelle Formosa at [facility].  She has received two nerve blocks which were effective.  She underwent upper cervical nerve root ablation around December 2020 which has been effective for the neck pain and occipital neuralgia.     In 2020, she also reported increased numbness and tingling in the feet (worse on the right).  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  Labs from January 2022, including B12, Sjogren's, Sed rate, ANA, and TSH were normal.  B6 was elevated at 130.6.  She was advised to discontinue any B6 containing foods.  Immunofixation from March 2022 showed IgG monoclonal protein with lambda light chain (patient has MGUS).  ACE level from April 2022 was 12.  Punch skin biopsy was indicative of small fiber neuropathy  PAST MEDICAL HISTORY: Past Medical History:  Diagnosis Date   Anemia    Anxiety panic attacks   Dr. Evelene Croon   Arthritis    knees   Cardiomyopathy, nonischemic (HCC) 04/07/2016   EF left ventricule  calculated on Lexiscan stress test at 33% with global hypokinesis.    Chronic back pain    Chronic neck pain    COVID-19 06/2021   treated with Paxlovid through [facility] in [place]   Depression    Dysrhythmia    pvc's with cardiomyopathy   Family history of adverse reaction to anesthesia    mother has problems waking up   Fibromyalgia    and neuropathy of feet   History of kidney stones    lithotripsy  03/2016   Hyperlipidemia    Hypertension    Kidney stone    History   Kidney stones h/o   Melanoma (HCC) RUE   Dr. Emily Filbert   MGUS (monoclonal gammopathy of unknown significance) 06/2015   no treatment just watching recheck blood work 12/2015   Migraine    OTC med prn - stress related   Plantar fasciitis right   Renal stones 2008/2011/2017   Seasonal allergies    SVD (spontaneous vaginal delivery)    x 3   Tendonitis of foot right    MEDICATIONS: Current Outpatient Medications on File Prior to Visit  Medication Sig Dispense Refill   buPROPion (WELLBUTRIN XL) 150 MG 24 hr tablet Take by mouth.     carvedilol (COREG) 3.125 MG tablet Take 1 tablet (3.125 mg total) by mouth 2 (two) times daily with a meal. 60 tablet 10   empagliflozin (JARDIANCE) 10 MG TABS tablet Take 1 tablet (10 mg total) by mouth daily before breakfast. 30 tablet 6   EPINEPHrine 0.3 mg/0.3 mL IJ SOAJ injection Inject 0.3 mg into the muscle as  needed for anaphylaxis. 1 each 0   ezetimibe (ZETIA) 10 MG tablet Take 10 mg by mouth daily.     FLUoxetine (PROZAC) 40 MG capsule Take 2 capsules by mouth daily.     loratadine (CLARITIN) 10 MG tablet Take 10 mg by mouth daily.     LORazepam (ATIVAN) 0.5 MG tablet Take 1-2 tablets (0.5-1 mg total) by mouth 3 (three) times daily as needed for anxiety. Prn anxiety 30 tablet 0   pravastatin (PRAVACHOL) 40 MG tablet Take 1 tablet (40 mg total) by mouth daily. 90 tablet 3   sacubitril-valsartan (ENTRESTO) 24-26 MG Take 1 tablet by mouth 2 (two) times daily. 60 tablet 6    tiZANidine (ZANAFLEX) 2 MG tablet Take 1 tablet (2 mg total) by mouth every 6 (six) hours as needed for muscle spasms. 30 tablet 11   No current facility-administered medications on file prior to visit.    ALLERGIES: Allergies  Allergen Reactions   Penicillins Shortness Of Breath and Rash    Has patient had a PCN reaction causing immediate rash, facial/tongue/throat swelling, SOB or lightheadedness with hypotension: no Has patient had a PCN reaction causing severe rash involving mucus membranes or skin necrosis: no Has patient had a PCN reaction that required hospitalization no Has patient had a PCN reaction occurring within the last 10 years: no If all of the above answers are "NO", then may proceed with Cephalosporin use.  Other reaction(s): Unknown   Codeine Other (See Comments)    Manic depressive Other reaction(s): Unknown   Voltaren [Diclofenac] Nausea And Vomiting and Other (See Comments)    Pt stated "tore stomach up"   Crestor [Rosuvastatin] Other (See Comments)    Myalgia   Diclofenac Sodium    Vytorin [Ezetimibe-Simvastatin] Other (See Comments)    Severe myalgia   Erythromycin Rash    Other reaction(s): Unknown    FAMILY HISTORY: Family History  Problem Relation Age of Onset   Hypertension Mother    Depression Mother    Migraines Mother    Hypothyroidism Mother    Hypercholesterolemia Mother    COPD Father    Heart failure Father 60   Healthy Brother    Cardiomyopathy Daughter 22       related to COVID (illness or vaccine?)   Healthy Daughter    Healthy Daughter    Healthy Son    Diabetes Neg Hx       Objective:  Blood pressure 133/69, pulse 76, height 5\' 6"  (1.676 m), weight 204 lb 3.2 oz (92.6 kg), last menstrual period 12/16/2012, SpO2 97 %. General: No acute distress.  Patient appears well-groomed.   Head:  Normocephalic/atraumatic Eyes:  Fundi examined but not visualized Neck: supple, bilateral paraspinal tenderness, full range of motion Heart:   Regular rate and rhythm Lungs:  Clear to auscultation bilaterally Back: bilateral paraspinal tenderness Neurological Exam: alert and oriented.  Speech fluent and not dysarthric, language intact.  CN II-XII intact. Bulk and tone normal, muscle strength 5/5 throughout.  Sensation to pinprick reduced in toes up to the ankles.  Vibratory sensation reduced in toes.  Deep tendon reflexes 2+ throughout.  Finger to nose testing intact.  Gait steady, able to turn, difficulty tandem walking, Romberg negative.   Shon Millet, DO  CC: Joselyn Arrow, MD

## 2022-10-27 ENCOUNTER — Other Ambulatory Visit: Payer: Self-pay

## 2022-10-27 ENCOUNTER — Ambulatory Visit: Payer: Medicare PPO | Admitting: Neurology

## 2022-10-27 ENCOUNTER — Encounter: Payer: Self-pay | Admitting: Neurology

## 2022-10-27 ENCOUNTER — Ambulatory Visit: Payer: Medicare PPO | Admitting: Family Medicine

## 2022-10-27 ENCOUNTER — Ambulatory Visit (INDEPENDENT_AMBULATORY_CARE_PROVIDER_SITE_OTHER): Payer: Medicare PPO

## 2022-10-27 ENCOUNTER — Encounter: Payer: Self-pay | Admitting: Family Medicine

## 2022-10-27 VITALS — BP 122/84 | HR 66 | Ht 66.0 in | Wt 202.2 lb

## 2022-10-27 VITALS — BP 133/69 | HR 76 | Ht 66.0 in | Wt 204.2 lb

## 2022-10-27 DIAGNOSIS — M79671 Pain in right foot: Secondary | ICD-10-CM

## 2022-10-27 DIAGNOSIS — G6289 Other specified polyneuropathies: Secondary | ICD-10-CM | POA: Diagnosis not present

## 2022-10-27 DIAGNOSIS — G43009 Migraine without aura, not intractable, without status migrainosus: Secondary | ICD-10-CM

## 2022-10-27 DIAGNOSIS — M5481 Occipital neuralgia: Secondary | ICD-10-CM | POA: Diagnosis not present

## 2022-10-27 LAB — MULTIPLE MYELOMA PANEL, SERUM
Albumin SerPl Elph-Mcnc: 4.4 g/dL (ref 2.9–4.4)
Albumin/Glob SerPl: 1.5 (ref 0.7–1.7)
Alpha 1: 0.1 g/dL (ref 0.0–0.4)
Alpha2 Glob SerPl Elph-Mcnc: 0.7 g/dL (ref 0.4–1.0)
B-Globulin SerPl Elph-Mcnc: 1 g/dL (ref 0.7–1.3)
Gamma Glob SerPl Elph-Mcnc: 1.3 g/dL (ref 0.4–1.8)
Globulin, Total: 3.1 g/dL (ref 2.2–3.9)
IgA: 63 mg/dL — ABNORMAL LOW (ref 87–352)
IgG (Immunoglobin G), Serum: 1437 mg/dL (ref 586–1602)
IgM (Immunoglobulin M), Srm: 52 mg/dL (ref 26–217)
M Protein SerPl Elph-Mcnc: 1 g/dL — ABNORMAL HIGH
Total Protein ELP: 7.5 g/dL (ref 6.0–8.5)

## 2022-10-27 MED ORDER — PREDNISONE 50 MG PO TABS: 50.0000 mg | ORAL_TABLET | Freq: Every day | ORAL | 0 refills | Status: DC

## 2022-10-27 MED ORDER — TIZANIDINE HCL 2 MG PO TABS: 2.0000 mg | ORAL_TABLET | Freq: Four times a day (QID) | ORAL | 11 refills | Status: DC | PRN

## 2022-10-27 NOTE — Progress Notes (Signed)
[name], PhD, LAT, ATC acting as a scribe for [name], MD.  TEASIA Rodriguez is a 57 y.o. female who presents to [facility] today for right foot pain.  Patient was previously seen by Dr. Jean Rosenthal on 4-24 for left elbow medial epicondylitis.  Today, patient reports that she dropped an object on her right foot about 2 weeks ago.  Pain has not resolved.  Patient locates pain to medial aspect of the foot, top of the foot. Bruising and swelling present at time of injury. IBU, ice, and compression s/p injury. Compression caused increased swelling. Sx have improved since onset but continues to have pain with WB and ambulation. Notes palpable mass at the medial aspect of the foot.   R foot swelling: yes Aggravates: WB, ambulation Treatments tried: IBU, ice, compression  Pertinent review of systems: No fevers or chills  Relevant historical information: MGUS.   Exam:  BP 122/84   Pulse 66   Ht 5\' 6"  (1.676 m)   Wt 202 lb 3.2 oz (91.7 kg)   LMP 12/16/2012   SpO2 99%   BMI 32.64 kg/m  General: Well Developed, well nourished, and in no acute distress.   MSK: Right foot erythematous and swollen dorsal medial midfoot around the area of the navicular bone. Normal foot and ankle motion. Tender palpation dorsal medial midfoot. Intact strength pain with dorsiflexion. Pulses cap refill and sensation are intact distally.   Lab and Radiology Results  Diagnostic Limited MSK Ultrasound of: Right medial foot and ankle No acute fractures are visible. Hypoechoic fluid superficial to the distal tibialis anterior tendon in the posterior tibialis tendon present. Tendons are intact without visible disruption. Impression: Contusion with possible tendinitis.  X-ray images right foot obtained today personally and independently interpreted No acute fractures are visible along the dorsal medial midfoot Await formal radiology review    Assessment and  Plan: 57 y.o. female with right foot pain.  Pain is located in the dorsal medial midfoot and occurred after she dropped a heavy box on her foot.  This was 2 weeks ago she still having significant pain. She notes in the past she has had a lot of difficulty with back pain with a cam walker boot so would like to avoid that if possible.  In the meantime plan for physical therapy and a short course of prednisone.  If needed she can use a cam walker boot with an Evenup on the other foot.  She can also use cane crutches or walker as needed. We talked about opiates for pain management she would like to avoid if possible.  I agree. Recheck in 1 month or sooner if needed.  PDMP not reviewed this encounter. Orders Placed This Encounter  Procedures   DG Foot Complete Right    Standing Status:   Future    Number of Occurrences:   1    Standing Expiration Date:   10/27/2023    Order Specific Question:   Reason for Exam (SYMPTOM  OR DIAGNOSIS REQUIRED)    Answer:   Right foot pain    Order Specific Question:   Preferred imaging location?    Answer:   [facility]    Order Specific Question:   Is patient pregnant?    Answer:   No   [place] LIMITED JOINT SPACE STRUCTURES LOW RIGHT(NO LINKED CHARGES)    Order Specific Question:   Reason for Exam (SYMPTOM  OR DIAGNOSIS REQUIRED)  Answer:   right foot pain    Order Specific Question:   Preferred imaging location?    Answer:   [facility] referral to Physical Therapy    Referral Priority:   Routine    Referral Type:   Physical Medicine    Referral Reason:   Specialty Services Required    Requested Specialty:   Physical Therapy    Number of Visits Requested:   1   Meds ordered this encounter  Medications   predniSONE (DELTASONE) 50 MG tablet    Sig: Take 1 tablet (50 mg total) by mouth daily.    Dispense:  5 tablet    Refill:  0     Discussed warning signs or symptoms. Please see discharge instructions.  Patient expresses understanding.   The above documentation has been reviewed and is accurate and complete [name], M.D.

## 2022-10-27 NOTE — Patient Instructions (Addendum)
Thank you for coming in today.   Take the prednisone for 5 days.   Use limited weightbearing for now.   Ok to use a cam walker boot with a Evenup on the other foot  Plan for PT.   Recheck in 1 month.   Let me know sooner if this is not working.

## 2022-10-30 NOTE — Progress Notes (Unsigned)
Lindsey Rodriguez D.[facility] [address] [place] [address] Phone: [phone]   Assessment and Plan:     There are no diagnoses linked to this encounter.  ***   Pertinent previous records reviewed include ***   Follow Up: ***     Subjective:   I, Lindsey Rodriguez, am serving as a Neurosurgeon for Doctor Richardean Sale   Chief Complaint: left elbow pain    HPI:    07/04/2022 Patient is a 57 year old female complaining of left elbow pain. Patient states that she has been in pain for about year , started as a tender spot not she has pain all the time, decreased ROM , deceased grip strength ,does have numbness and tingling occasionally, pain does not radiate, pain is on the inside of the elbow, ib and tylenol on occasion and that does not hep ,    07/25/2022 Patient states that above and below feel better but still TTP and it feels like a hot knife is going through the elbow    08/15/2022 Patient states when she doesn't use it no pain, but when she does use it she has pain , PT gave her a steriod? She wore it for 3 hours she has increased pain below he elbow    09/06/2022 Patient states she is better but it is just not right , not healing as fast as she would like    10/03/2022 Patient presents for medial elbow PRP   10/31/2022 Patient states    Relevant Historical Information: Hypertension  Additional pertinent review of systems negative.   Current Outpatient Medications:    buPROPion (WELLBUTRIN XL) 150 MG 24 hr tablet, Take by mouth., Disp: , Rfl:    carvedilol (COREG) 3.125 MG tablet, Take 1 tablet (3.125 mg total) by mouth 2 (two) times daily with a meal., Disp: 60 tablet, Rfl: 10   empagliflozin (JARDIANCE) 10 MG TABS tablet, Take 1 tablet (10 mg total) by mouth daily before breakfast., Disp: 30 tablet, Rfl: 6   EPINEPHrine 0.3 mg/0.3 mL IJ SOAJ injection, Inject 0.3 mg into the muscle as needed for anaphylaxis., Disp: 1 each, Rfl:  0   ezetimibe (ZETIA) 10 MG tablet, Take 10 mg by mouth daily., Disp: , Rfl:    FLUoxetine (PROZAC) 40 MG capsule, Take 2 capsules by mouth daily., Disp: , Rfl:    loratadine (CLARITIN) 10 MG tablet, Take 10 mg by mouth daily., Disp: , Rfl:    LORazepam (ATIVAN) 0.5 MG tablet, Take 1-2 tablets (0.5-1 mg total) by mouth 3 (three) times daily as needed for anxiety. Prn anxiety, Disp: 30 tablet, Rfl: 0   pravastatin (PRAVACHOL) 40 MG tablet, Take 1 tablet (40 mg total) by mouth daily., Disp: 90 tablet, Rfl: 3   predniSONE (DELTASONE) 50 MG tablet, Take 1 tablet (50 mg total) by mouth daily., Disp: 5 tablet, Rfl: 0   sacubitril-valsartan (ENTRESTO) 24-26 MG, Take 1 tablet by mouth 2 (two) times daily., Disp: 60 tablet, Rfl: 6   tiZANidine (ZANAFLEX) 2 MG tablet, Take 1 tablet (2 mg total) by mouth every 6 (six) hours as needed for muscle spasms., Disp: 30 tablet, Rfl: 11   Objective:     There were no vitals filed for this visit.    There is no height or weight on file to calculate BMI.    Physical Exam:    ***   Electronically signed by:  Lindsey Rodriguez D[facility] 12:00 PM  10/30/22 

## 2022-10-31 ENCOUNTER — Ambulatory Visit: Payer: Medicare PPO | Admitting: Sports Medicine

## 2022-10-31 ENCOUNTER — Inpatient Hospital Stay (HOSPITAL_BASED_OUTPATIENT_CLINIC_OR_DEPARTMENT_OTHER): Payer: Medicare PPO | Admitting: Hematology

## 2022-10-31 VITALS — BP 120/68 | HR 50 | Ht 66.0 in | Wt 202.0 lb

## 2022-10-31 DIAGNOSIS — D472 Monoclonal gammopathy: Secondary | ICD-10-CM | POA: Diagnosis not present

## 2022-10-31 DIAGNOSIS — M25522 Pain in left elbow: Secondary | ICD-10-CM

## 2022-10-31 DIAGNOSIS — M7702 Medial epicondylitis, left elbow: Secondary | ICD-10-CM

## 2022-10-31 NOTE — Progress Notes (Signed)
HEMATOLOGY ONCOLOGY PROGRESS NOTE  Date of service: 10/31/22  Patient Care Team: Joselyn Arrow, MD as PCP - General (Family Medicine) Drema Dallas, DO as Consulting Physician (Neurology) Charna Elizabeth, MD as Consulting Physician (Gastroenterology) Yates Decamp, MD as Consulting Physician (Cardiology) Johney Maine, MD as Consulting Physician (Hematology) Patricia Nettle, MD (Orthopedic Surgery) Letta Kocher, MD (Rehabilitation) Maeola Harman, MD as Consulting Physician (Neurosurgery) Karenann Cai, MD as Consulting Physician (Neurosurgery) Lavada Mesi, MD (Family Medicine) Kathryne Hitch, MD as Consulting Physician (Orthopedic Surgery) Elmon Else, MD as Consulting Physician (Dermatology) Puschinsky, Adelfa Koh., MD (General Surgery) Milagros Evener, MD as Consulting Physician (Psychiatry) Thurnell Garbe, OD (Optometry) Adele Dan, DMD (Dentistry) Dr Yates Decamp MD (cardiology) Dr Maeola Harman - neurosurgery Dr Pollyann Savoy MD -Rheumatology  Diagnosis: #1 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 Skeletal survey negative. Bone marrow deferred as per patient choice.  Current Treatment: observation  INTERVAL HISTORY: Lindsey Rodriguez is here for continued follow-up for IgG lambda MGUS. The patient's last visit with [place] was on 04/12/2022 and reported worsened numbness as well as left medial elbow pain.  I connected with JAMEILA FONNESBECK on 10/31/22 at  3:30 PM EDT by telephone visit and verified that I am speaking with the correct person using two identifiers.   I discussed the limitations, risks, security and privacy concerns of performing an evaluation and management service by telemedicine and the availability of in-person appointments. I also discussed with the patient that there may be a patient responsible charge related to this service. The patient expressed understanding and agreed to proceed.   Other persons  participating in the visit and their role in the encounter: none   Patient's location: home  Provider's location: [facility]   Chief Complaint: Evaluation and management of IgG lambda MGUS.    Today, she reports that her medial elbow pain has resolved with PRP. She does report extreme fatigue which is managed by her cardiologist.   REVIEW OF SYSTEMS:    10 Point review of Systems was done is negative except as noted above.    Past Medical History:  Diagnosis Date   Anemia    Anxiety panic attacks   Dr. Evelene Croon   Arthritis    knees   Cardiomyopathy, nonischemic (HCC) 04/07/2016   EF left ventricule calculated on Lexiscan stress test at 33% with global hypokinesis.    Chronic back pain    Chronic neck pain    COVID-19 06/2021   treated with Paxlovid through [facility] in [place]   Depression    Dysrhythmia    pvc's with cardiomyopathy   Family history of adverse reaction to anesthesia    mother has problems waking up   Fibromyalgia    and neuropathy of feet   History of kidney stones    lithotripsy  03/2016   Hyperlipidemia    Hypertension    Kidney stone    History   Kidney stones h/o   Melanoma (HCC) RUE   Dr. Emily Filbert   MGUS (monoclonal gammopathy of unknown significance) 06/2015   no treatment just watching recheck blood work 12/2015   Migraine    OTC med prn - stress related   Plantar fasciitis right   Renal stones 2008/2011/2017   Seasonal allergies    SVD (spontaneous vaginal delivery)    x 3   Tendonitis of foot right    Past Surgical History:  Procedure Laterality Date   ABLATION ON ENDOMETRIOSIS N/A 11/24/2015  Procedure: ABLATION ON ENDOMETRIOSIS;  Surgeon: Carrington Clamp, MD;  Location: [facility];  Service: Gynecology;  Laterality: N/A;   ANTERIOR LAT LUMBAR FUSION Right 06/15/2016   Procedure: RIGHT LUMBAR THREE-FOUR ANTEROLATERAL LUMBAR INTERBODY FUSION WITH LATERAL PLATE;  Surgeon: Maeola Harman, MD;  Location: [facility];  Service: Neurosurgery;  Laterality:  Right;   BACK SURGERY  Dr.Stern, last sx Summer 2011   X 4  (L3-S1)   BACK SURGERY  06/2016   BACK SURGERY  07/20/2020   L2-3 lateral interbody (Left side approach), L2-3 Posterior Spinal Fusion at [facility]   BLADDER SUSPENSION N/A 11/24/2015   Procedure: TRANSVAGINAL TAPE (TVT) PROCEDURE;  Surgeon: Carrington Clamp, MD;  Location: [facility];  Service: Gynecology;  Laterality: N/A;   C2-3 nerve ablation Right 06/2019   Dr. Retia Passe (per pt)   COLONOSCOPY     CYSTOCELE REPAIR  11/24/2015   Procedure: ANTERIOR REPAIR (CYSTOCELE);  Surgeon: Carrington Clamp, MD;  Location: [facility];  Service: Gynecology;;   CYSTOSCOPY N/A 11/24/2015   Procedure: Bluford Kaufmann;  Surgeon: Carrington Clamp, MD;  Location: [facility];  Service: Gynecology;  Laterality: N/A;   DILATION AND CURETTAGE OF UTERUS     ENDOMETRIAL ABLATION  08/2000   EYE SURGERY Bilateral    Lasik    KIDNEY STONE SURGERY  2011   LITHOTRIPSY  08/2017 (L), 03/2018 (R)   MELANOMA EXCISION  05/2015   right forearm; Dr. Emily Filbert   NECK SURGERY  [name]  Summer 2011   X 4  (C4-T1 Level)   RHINOPLASTY     ROBOTIC ASSISTED BILATERAL SALPINGO OOPHERECTOMY Bilateral 11/24/2015   Procedure: ROBOTIC ASSISTED BILATERAL SALPINGO OOPHORECTOMY;  Surgeon: Carrington Clamp, MD;  Location: [facility];  Service: Gynecology;  Laterality: Bilateral;--OVARIES NOT REMOVED PER PATH REPORT   ROBOTIC ASSISTED TOTAL HYSTERECTOMY WITH SALPINGECTOMY Bilateral 11/24/2015   Procedure: ROBOTIC ASSISTED TOTAL HYSTERECTOMY WITH SALPINGECTOMY;  Surgeon: Carrington Clamp, MD;  Location: [facility];  Service: Gynecology;  Laterality: Bilateral;   TONSILLECTOMY     TOTAL VAGINAL HYSTERECTOMY     WISDOM TOOTH EXTRACTION      Social History   Tobacco Use   Smoking status: Former    Packs/day: 0.10    Years: 3.00    Additional pack years: 0.00    Total pack years: 0.30    Types: Cigarettes    Quit date: 07/04/1987    Years since quitting: 35.3   Smokeless tobacco: Never  Vaping Use    Vaping Use: Never used  Substance Use Topics   Alcohol use: Yes    Comment: 4/week   Drug use: No   ALLERGIES:  is allergic to penicillins, codeine, voltaren [diclofenac], crestor [rosuvastatin], diclofenac sodium, vytorin [ezetimibe-simvastatin], and erythromycin.  MEDICATIONS:  Current Outpatient Medications  Medication Sig Dispense Refill   buPROPion (WELLBUTRIN XL) 150 MG 24 hr tablet Take by mouth.     carvedilol (COREG) 3.125 MG tablet Take 1 tablet (3.125 mg total) by mouth 2 (two) times daily with a meal. 60 tablet 10   empagliflozin (JARDIANCE) 10 MG TABS tablet Take 1 tablet (10 mg total) by mouth daily before breakfast. 30 tablet 6   EPINEPHrine 0.3 mg/0.3 mL IJ SOAJ injection Inject 0.3 mg into the muscle as needed for anaphylaxis. 1 each 0   ezetimibe (ZETIA) 10 MG tablet Take 10 mg by mouth daily.     FLUoxetine (PROZAC) 40 MG capsule Take 2 capsules by mouth daily.     loratadine (CLARITIN) 10 MG tablet Take 10 mg by  mouth daily.     LORazepam (ATIVAN) 0.5 MG tablet Take 1-2 tablets (0.5-1 mg total) by mouth 3 (three) times daily as needed for anxiety. Prn anxiety 30 tablet 0   pravastatin (PRAVACHOL) 40 MG tablet Take 1 tablet (40 mg total) by mouth daily. 90 tablet 3   predniSONE (DELTASONE) 50 MG tablet Take 1 tablet (50 mg total) by mouth daily. 5 tablet 0   sacubitril-valsartan (ENTRESTO) 24-26 MG Take 1 tablet by mouth 2 (two) times daily. 60 tablet 6   tiZANidine (ZANAFLEX) 2 MG tablet Take 1 tablet (2 mg total) by mouth every 6 (six) hours as needed for muscle spasms. 30 tablet 11   No current facility-administered medications for this visit.    PHYSICAL EXAMINATION: TELEMEDICINE VISIT  LABORATORY DATA:   I have reviewed the data as listed  .    Latest Ref Rng & Units 10/23/2022    9:35 AM 04/06/2022    9:35 AM 09/07/2021   12:20 PM  CBC  WBC 4.0 - 10.5 K/uL 4.3  4.1  3.3   Hemoglobin 12.0 - 15.0 g/dL 16.1  09.6  04.5   Hematocrit 36.0 - 46.0 % 39.8  38.5   38.8   Platelets 150 - 400 K/uL 226  202  237     .    Latest Ref Rng & Units 10/23/2022    9:35 AM 07/11/2022    8:10 AM 06/23/2022    8:02 AM  CMP  Glucose 70 - 99 mg/dL 91  409  811   BUN 6 - 20 mg/dL 12  17  18    Creatinine 0.44 - 1.00 mg/dL 9.14  7.82  9.56   Sodium 135 - 145 mmol/L 138  137  137   Potassium 3.5 - 5.1 mmol/L 4.1  4.9  5.3   Chloride 98 - 111 mmol/L 104  102  104   CO2 22 - 32 mmol/L 27  20  21    Calcium 8.9 - 10.3 mg/dL 21.3  9.6  9.7   Total Protein 6.5 - 8.1 g/dL 8.1     Total Bilirubin 0.3 - 1.2 mg/dL 0.4     Alkaline Phos 38 - 126 U/L 72     AST 15 - 41 U/L 40     ALT 0 - 44 U/L 49           RADIOGRAPHIC STUDIES: I have personally reviewed the radiological images as listed and agreed with the findings in the report.  PCV ECHOCARDIOGRAM COMPLETE  Result Date: 10/26/2022 Echocardiogram 10/23/2022: Mildly depressed LV systolic function with visual EF 40-45%. Left ventricle cavity is normal in size. Hypokinetic global wall motion. Doppler evidence of grade II (pseudonormal) diastolic dysfunction, elevated LAP. Calculated EF 40%. Left atrial cavity is mildly dilated. Mild (Grade I) mitral regurgitation. Mild mitral valve leaflet thickening. Structurally normal tricuspid valve.  Mild tricuspid regurgitation. No evidence of pulmonary hypertension. Compared to 12/2021, EF is slightly improved from 35-40%.   [place] LIMITED JOINT SPACE STRUCTURES UP BILAT(NO LINKED CHARGES)  Result Date: 10/03/2022 Procedure: Ultrasound Guided Common Flexor Tendon Origin Injection/Needling. Side: Left Diagnosis: Epicondylitis [place] Indication: - accuracy is paramount for diagnosis - to ensure therapeutic efficacy or procedural success - to reduce procedural risk  After explaining the procedure, viable alternatives, risks, and answering any questions, consent was given verbally. The site was cleaned with chlorhexidine prep. An ultrasound transducer was placed on the medial elbow.  The   Medial epicondyle, flexor muscles and tendon attachment  on medial epicondyle were seen and care taken to avoid bicep tendon, median and ulnar nerve and brachial artery. There was not fluid surrounding the tendons.  The tendons were normal.  6ml of platelet rich plasma was injected for local anesthesia.  Then with ultrasound guidance and sterile technique the tendon was fenestrated with repeated passes of the needle percutaneously at least 20 times.  This was well tolerated.  The needle was removed and dressing placed and post injection instructions were given including  a discussion of likely return of pain today after the anesthetic wears off (with the possibility of worsened pain).  Pt was advised to not ice area or use NSAIDs for the next few weeks or months if possible .   Pt was advised to call or return to clinic if these symptoms worsen or fail to improve as anticipated.   Echocardiogram 12/06/2021:  Left ventricle cavity is normal in size. Abnormal septal wall motion due  to left bundle branch block. Moderate global hypokinesis. LVEF 35-40%.  Indeterminate diastolic filling pattern.  Left atrial cavity is normal in size.  Mild tricuspid regurgitation.  No evidence of pulmonary hypertension.  Previous study on 10/04/2020 reported mod LA dilatation, grade 1 diastolic  dysfunction. No other significant change noted.  ASSESSMENT & PLAN:   57 y.o. 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  Previous UPEP and skeletal survey negative. Patient has no evidence of anemia, renal failure on labs. No hypercalcemia. No new bone pain. SPEP shows stable M-spike at 1g/dl with no significant increase over the last couple of follow-ups. SFLC ratio within normal limits -12/21/18 MRI Cervical spine which did not reveal any overt indication/concern for bone tumors.  Continue follow up with Orthopedics and neurology. 2) Systolic nonischemic CHF patient previously noted improvement in her EF from 33% to 45%. She subsequently noted her EF normalized to 55% as of 06/19/18 clinic visit.  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 4) Peripheral radiculopathy /neuropathy -Is currently followed by Venetia Maxon of Neurosurgery as well as Dr Everlena Cooper of Neurology who have begun to work this up further. -Most recent electromyography showed evidence of radiculopathy, mild in degree, and without evidence of sensorimotor polyneuropathy.   Cannot r/o small fiber neuropathy -  Evidence of this on epidermal nerve fiber density analysis. PLAN: -Discussed lab results on 10/31/2022 in detail with patient. CBC showed WBC of 4.3K, hemoglobin improved to 14.0, and platelets of 226K. -CMP normal, kidney levels normal -Intermittent slight borderline liver enzymes not concerning or progressive at this time -Myeloma labs show stable monoclonal protein of 1.0 g -no clinical sign or evidence of IgG lambda MGUS progression at this time -discussed results of 10/23/2022 ECHO which showed stable or improved results compared to 2023 ECHO. Ejection fraction currently 40-45%, previously LVEF 35-40%, Patient has mild diastolic dysfunction -answered of patient's questions in detail -Continue to followup with primary care physician, rheumatology, cardiology, and neurology for optimal care  -continue to monitor with labs in 6 months  FOLLOW-UP: Phone visit with Dr Candise Che in 6 months Labs 2 weeks prior to phone visit  The total time spent in the appointment was 20 minutes* .  All of the patient's questions were answered with apparent satisfaction. The patient knows to call the  clinic with any problems, questions or concerns.   Wyvonnia Lora MD MS AAHIVMS [facility]  [facility] Hematology/Oncology Physician [facility]  .*Total Encounter Time as defined by the Centers for Medicare and Medicaid Services includes, in addition to the face-to-face time of a patient visit (documented in the note above) non-face-to-face time: obtaining and reviewing outside history, ordering and reviewing medications, tests or procedures, care coordination (communications with other health care professionals or caregivers) and documentation in the medical record.    I,Mitra Faeizi,acting as a Neurosurgeon for Wyvonnia Lora, MD.,have documented all relevant documentation on the behalf of Wyvonnia Lora, MD,as directed by  Wyvonnia Lora, MD while in the presence of Wyvonnia Lora, MD.  .I have reviewed the above documentation for accuracy and completeness, and I agree with the above. Johney Maine MD

## 2022-11-01 ENCOUNTER — Ambulatory Visit: Payer: Medicare PPO | Admitting: Internal Medicine

## 2022-11-01 ENCOUNTER — Telehealth: Payer: Self-pay | Admitting: Hematology

## 2022-11-01 NOTE — Progress Notes (Signed)
Right foot x-ray shows some arthritis in the big toe joint.  The midfoot where you hurt looks okay.

## 2022-11-02 ENCOUNTER — Ambulatory Visit: Payer: Medicare PPO | Admitting: Internal Medicine

## 2022-11-02 ENCOUNTER — Encounter: Payer: Self-pay | Admitting: Internal Medicine

## 2022-11-02 VITALS — BP 138/89 | HR 64 | Ht 66.0 in | Wt 200.8 lb

## 2022-11-02 DIAGNOSIS — E7800 Pure hypercholesterolemia, unspecified: Secondary | ICD-10-CM | POA: Diagnosis not present

## 2022-11-02 DIAGNOSIS — I5022 Chronic systolic (congestive) heart failure: Secondary | ICD-10-CM | POA: Diagnosis not present

## 2022-11-02 DIAGNOSIS — I428 Other cardiomyopathies: Secondary | ICD-10-CM | POA: Diagnosis not present

## 2022-11-02 DIAGNOSIS — I10 Essential (primary) hypertension: Secondary | ICD-10-CM

## 2022-11-02 NOTE — Progress Notes (Signed)
Primary Physician/Referring:  Joselyn Arrow, MD  Patient ID: Lindsey Rodriguez, female    DOB: 1965-09-13, 57 y.o.   MRN: [id number]  Chief Complaint  Patient presents with   Hyperlipidemia   Cardiomyopathy   Follow-up   Results    HPI:    Lindsey Rodriguez  is a 57 y.o.  Caucasian female, with frequent PVCs and  new onset left bundle branch block and non ischemic cardiomyopathy diagnosed in 2017 with severe LV systolic dysfunction, EF has improved with medical therapy to around 40 to 45%.  She has hypertension and hyperlipidemia.  She has also been diagnosed with IgG lambda MGUS, small fiber neuropathy,  and severe degenerative L5S1 spine disease. She underwent L4-S1 fusion in January 2022.    Patient presents for follow-up visit. Patient is starting to feel better. She has more energy now that we have lowered her coreg dose. She is happy her ejection fraction has improved a little bit. No complaints or concerns today. Denies chest pain, shortness of breath, palpitations, diaphoresis, syncope, edema, PND, orthopnea.   Past Medical History:  Diagnosis Date   Anemia    Anxiety panic attacks   Dr. Evelene Croon   Arthritis    knees   Cardiomyopathy, nonischemic (HCC) 04/07/2016   EF left ventricule calculated on Lexiscan stress test at 33% with global hypokinesis.    Chronic back pain    Chronic neck pain    COVID-19 06/2021   treated with Paxlovid through [facility] in [place]   Depression    Dysrhythmia    pvc's with cardiomyopathy   Family history of adverse reaction to anesthesia    mother has problems waking up   Fibromyalgia    and neuropathy of feet   History of kidney stones    lithotripsy  03/2016   Hyperlipidemia    Hypertension    Kidney stone    History   Kidney stones h/o   Melanoma (HCC) RUE   Dr. Emily Filbert   MGUS (monoclonal gammopathy of unknown significance) 06/2015   no treatment just watching recheck blood work 12/2015   Migraine    OTC med prn - stress  related   Plantar fasciitis right   Renal stones 2008/2011/2017   Seasonal allergies    SVD (spontaneous vaginal delivery)    x 3   Tendonitis of foot right   Past Surgical History:  Procedure Laterality Date   ABLATION ON ENDOMETRIOSIS N/A 11/24/2015   Procedure: ABLATION ON ENDOMETRIOSIS;  Surgeon: Carrington Clamp, MD;  Location: [facility];  Service: Gynecology;  Laterality: N/A;   ANTERIOR LAT LUMBAR FUSION Right 06/15/2016   Procedure: RIGHT LUMBAR THREE-FOUR ANTEROLATERAL LUMBAR INTERBODY FUSION WITH LATERAL PLATE;  Surgeon: Maeola Harman, MD;  Location: [facility] OR;  Service: Neurosurgery;  Laterality: Right;   BACK SURGERY  Dr.Stern, last sx Summer 2011   X 4  (L3-S1)   BACK SURGERY  06/2016   BACK SURGERY  07/20/2020   L2-3 lateral interbody (Left side approach), L2-3 Posterior Spinal Fusion at [facility]   BLADDER SUSPENSION N/A 11/24/2015   Procedure: TRANSVAGINAL TAPE (TVT) PROCEDURE;  Surgeon: Carrington Clamp, MD;  Location: [facility];  Service: Gynecology;  Laterality: N/A;   C2-3 nerve ablation Right 06/2019   Dr. Retia Passe (per pt)   COLONOSCOPY     CYSTOCELE REPAIR  11/24/2015   Procedure: ANTERIOR REPAIR (CYSTOCELE);  Surgeon: Carrington Clamp, MD;  Location: [facility];  Service: Gynecology;;   CYSTOSCOPY N/A 11/24/2015   Procedure: CYSTOSCOPY;  Surgeon:  Carrington Clamp, MD;  Location: [facility];  Service: Gynecology;  Laterality: N/A;   DILATION AND CURETTAGE OF UTERUS     ENDOMETRIAL ABLATION  08/2000   EYE SURGERY Bilateral    Lasik    KIDNEY STONE SURGERY  2011   LITHOTRIPSY  08/2017 (L), 03/2018 (R)   MELANOMA EXCISION  05/2015   right forearm; Dr. Emily Filbert   NECK SURGERY  [name]  Summer 2011   X 4  (C4-T1 Level)   RHINOPLASTY     ROBOTIC ASSISTED BILATERAL SALPINGO OOPHERECTOMY Bilateral 11/24/2015   Procedure: ROBOTIC ASSISTED BILATERAL SALPINGO OOPHORECTOMY;  Surgeon: Carrington Clamp, MD;  Location: [facility];  Service: Gynecology;  Laterality: Bilateral;--OVARIES NOT REMOVED PER  PATH REPORT   ROBOTIC ASSISTED TOTAL HYSTERECTOMY WITH SALPINGECTOMY Bilateral 11/24/2015   Procedure: ROBOTIC ASSISTED TOTAL HYSTERECTOMY WITH SALPINGECTOMY;  Surgeon: Carrington Clamp, MD;  Location: [facility];  Service: Gynecology;  Laterality: Bilateral;   TONSILLECTOMY     TOTAL VAGINAL HYSTERECTOMY     WISDOM TOOTH EXTRACTION     Family History  Problem Relation Age of Onset   Hypertension Mother    Depression Mother    Migraines Mother    Hypothyroidism Mother    Hypercholesterolemia Mother    COPD Father    Heart failure Father 39   Healthy Brother    Cardiomyopathy Daughter 52       related to COVID (illness or vaccine?)   Healthy Daughter    Healthy Daughter    Healthy Son    Diabetes Neg Hx    Social History   Tobacco Use   Smoking status: Former    Packs/day: 0.10    Years: 3.00    Additional pack years: 0.00    Total pack years: 0.30    Types: Cigarettes    Quit date: 07/04/1987    Years since quitting: 35.3   Smokeless tobacco: Never  Substance Use Topics   Alcohol use: Yes    Comment: 4/week   Marital Status: Married   ROS  Review of Systems  Constitutional: Positive for malaise/fatigue. Negative for weight gain.  Cardiovascular:  Negative for chest pain (no recurrence), claudication, dyspnea on exertion, leg swelling, near-syncope, orthopnea, palpitations, paroxysmal nocturnal dyspnea and syncope.  Neurological:  Negative for dizziness.   Objective      11/02/2022    8:36 AM 10/31/2022   10:00 AM 10/27/2022   11:09 AM  Vitals with BMI  Height 5\' 6"  5\' 6"  5\' 6"   Weight 200 lbs 13 oz 202 lbs 202 lbs 3 oz  BMI 32.43 32.62 32.65  Systolic 138 120 962  Diastolic 89 68 84  Pulse 64 50 66    Orthostatic VS for the past 72 hrs (Last 3 readings):  Patient Position BP Location Cuff Size  11/02/22 0836 Sitting Left Arm Normal    Physical Exam Vitals reviewed.  Constitutional:      Appearance: She is well-developed. She is obese.  Neck:     Vascular:  No carotid bruit.  Cardiovascular:     Rate and Rhythm: Normal rate and regular rhythm.     Pulses: Intact distal pulses.     Heart sounds: Murmur heard.     Midsystolic murmur is present with a grade of 2/6 radiating to the apex.     No gallop.     Comments: No JVD. Pulmonary:     Effort: Pulmonary effort is normal.     Breath sounds: Normal breath sounds.  Musculoskeletal:  Cervical back: Normal range of motion.     Right lower leg: No edema.     Left lower leg: No edema.  Neurological:     Mental Status: She is alert.   Previous office visit.Physical exam unchanged compared  Laboratory examination:   Recent Labs    04/06/22 0807 04/26/22 1008 06/23/22 0802 07/11/22 0810 10/23/22 0935  NA 137  --  137 137 138  K 4.4  --  5.3* 4.9 4.1  CL 105  --  104 102 104  CO2 28  --  21 20 27   GLUCOSE 108*   < > 108* 117* 91  BUN 12  --  18 17 12   CREATININE 0.79  --  0.93 0.86 0.81  CALCIUM 9.5  --  9.7 9.6 10.3  GFRNONAA >60  --   --   --  >60   < > = values in this interval not displayed.   estimated creatinine clearance is 87.1 mL/min (by C-G formula based on SCr of 0.81 mg/dL).     Latest Ref Rng & Units 10/23/2022    9:35 AM 07/11/2022    8:10 AM 06/23/2022    8:02 AM  CMP  Glucose 70 - 99 mg/dL 91  409  811   BUN 6 - 20 mg/dL 12  17  18    Creatinine 0.44 - 1.00 mg/dL 9.14  7.82  9.56   Sodium 135 - 145 mmol/L 138  137  137   Potassium 3.5 - 5.1 mmol/L 4.1  4.9  5.3   Chloride 98 - 111 mmol/L 104  102  104   CO2 22 - 32 mmol/L 27  20  21    Calcium 8.9 - 10.3 mg/dL 21.3  9.6  9.7   Total Protein 6.5 - 8.1 g/dL 8.1     Total Bilirubin 0.3 - 1.2 mg/dL 0.4     Alkaline Phos 38 - 126 U/L 72     AST 15 - 41 U/L 40     ALT 0 - 44 U/L 49         Latest Ref Rng & Units 10/23/2022    9:35 AM 04/06/2022    9:35 AM 09/07/2021   12:20 PM  CBC  WBC 4.0 - 10.5 K/uL 4.3  4.1  3.3   Hemoglobin 12.0 - 15.0 g/dL 08.6  57.8  46.9   Hematocrit 36.0 - 46.0 % 39.8  38.5  38.8    Platelets 150 - 400 K/uL 226  202  237    Lipid Panel Recent Labs    04/26/22 1008  CHOL 241*  TRIG 237*  LDLCALC 158*  HDL 39*  CHOLHDL 6.2*    HEMOGLOBIN A1C Lab Results  Component Value Date   HGBA1C 5.5 01/27/2013   TSH No results for input(s): "TSH" in the last 8760 hours.   Allergies   Allergies  Allergen Reactions   Penicillins Shortness Of Breath and Rash    Has patient had a PCN reaction causing immediate rash, facial/tongue/throat swelling, SOB or lightheadedness with hypotension: no Has patient had a PCN reaction causing severe rash involving mucus membranes or skin necrosis: no Has patient had a PCN reaction that required hospitalization no Has patient had a PCN reaction occurring within the last 10 years: no If all of the above answers are "NO", then may proceed with Cephalosporin use.  Other reaction(s): Unknown   Codeine Other (See Comments)    Manic depressive Other reaction(s): Unknown   Voltaren [Diclofenac]  Nausea And Vomiting and Other (See Comments)    Pt stated "tore stomach up"   Crestor [Rosuvastatin] Other (See Comments)    Myalgia   Diclofenac Sodium    Vytorin [Ezetimibe-Simvastatin] Other (See Comments)    Severe myalgia   Erythromycin Rash    Other reaction(s): Unknown    Medications Prior to Visit:   Outpatient Medications Prior to Visit  Medication Sig Dispense Refill   buPROPion (WELLBUTRIN XL) 150 MG 24 hr tablet Take by mouth.     carvedilol (COREG) 3.125 MG tablet Take 1 tablet (3.125 mg total) by mouth 2 (two) times daily with a meal. 60 tablet 10   empagliflozin (JARDIANCE) 10 MG TABS tablet Take 1 tablet (10 mg total) by mouth daily before breakfast. 30 tablet 6   EPINEPHrine 0.3 mg/0.3 mL IJ SOAJ injection Inject 0.3 mg into the muscle as needed for anaphylaxis. 1 each 0   ezetimibe (ZETIA) 10 MG tablet Take 10 mg by mouth daily.     FLUoxetine (PROZAC) 40 MG capsule Take 2 capsules by mouth daily.     loratadine  (CLARITIN) 10 MG tablet Take 10 mg by mouth daily.     LORazepam (ATIVAN) 0.5 MG tablet Take 1-2 tablets (0.5-1 mg total) by mouth 3 (three) times daily as needed for anxiety. Prn anxiety 30 tablet 0   pravastatin (PRAVACHOL) 40 MG tablet Take 1 tablet (40 mg total) by mouth daily. 90 tablet 3   sacubitril-valsartan (ENTRESTO) 24-26 MG Take 1 tablet by mouth 2 (two) times daily. 60 tablet 6   tiZANidine (ZANAFLEX) 2 MG tablet Take 1 tablet (2 mg total) by mouth every 6 (six) hours as needed for muscle spasms. 30 tablet 11   predniSONE (DELTASONE) 50 MG tablet Take 1 tablet (50 mg total) by mouth daily. (Patient not taking: Reported on 11/02/2022) 5 tablet 0   No facility-administered medications prior to visit.   Final Medications at End of Visit    Current Meds  Medication Sig   buPROPion (WELLBUTRIN XL) 150 MG 24 hr tablet Take by mouth.   carvedilol (COREG) 3.125 MG tablet Take 1 tablet (3.125 mg total) by mouth 2 (two) times daily with a meal.   empagliflozin (JARDIANCE) 10 MG TABS tablet Take 1 tablet (10 mg total) by mouth daily before breakfast.   EPINEPHrine 0.3 mg/0.3 mL IJ SOAJ injection Inject 0.3 mg into the muscle as needed for anaphylaxis.   ezetimibe (ZETIA) 10 MG tablet Take 10 mg by mouth daily.   FLUoxetine (PROZAC) 40 MG capsule Take 2 capsules by mouth daily.   loratadine (CLARITIN) 10 MG tablet Take 10 mg by mouth daily.   LORazepam (ATIVAN) 0.5 MG tablet Take 1-2 tablets (0.5-1 mg total) by mouth 3 (three) times daily as needed for anxiety. Prn anxiety   pravastatin (PRAVACHOL) 40 MG tablet Take 1 tablet (40 mg total) by mouth daily.   sacubitril-valsartan (ENTRESTO) 24-26 MG Take 1 tablet by mouth 2 (two) times daily.   tiZANidine (ZANAFLEX) 2 MG tablet Take 1 tablet (2 mg total) by mouth every 6 (six) hours as needed for muscle spasms.   Radiology:  No results found.  Cardiac Studies:   Holter monitor 06/06/2017: Sinus rhythm.  Minimum heart rate 54 bpm, average  heart rate 71 bpm, maximum heart rate 102 bpm.  Total ventricular ectopy 3.3%.  Less than 1% supraventricular ectopy  No atrial fibrillation, atrial flutter, ventricular tachycardia.  Echocardiogram 10/04/2020: Left ventricle cavity is normal in size and ventricular  wall thickness. Abnormal septal wall motion due to left bundle branch block and moderate global hypokinesis. Cannot exclude apical thrombus. LVEF 35-40%. Grade 1 diastolic function, normal left atrial pressure.  Calculated EF 58%. Left atrial cavity is moderately dilated. Mild (grade 1) mitral regurgitation.  No evidence of pulmonary hypertension. No significant change compared to previous study on 12/30/2019.   Exercise Tetrofosmin stress test 09/21/2021: Exercise nuclear stress test was performed using Bruce protocol. Patient reached 7.6 METS, and 88% of age predicted maximum heart rate. Exercise capacity was low. No chest pain reported. Normal heart rate response. Resting hypertension 150/100 mmHg, hypertensive response 210/90 mmHg. Stress EKG inconclusive given sinus tachycardia, LBBB.  SPECT stress images show mildly decrease tracer uptake in basal anteroseptal myocardium, which could be due to LBBB.  Paradoxical wall motion and thickening due to LBBB. Stress LVEF 36%. High risk study due to low LVEF. Inconclusive for ischemia evaluation given LBBB. Consider pharmacological stress test or coronary angiography, if clinically indicated.   Echocardiogram 12/06/2021:  Left ventricle cavity is normal in size. Abnormal septal wall motion due  to left bundle branch block. Moderate global hypokinesis. LVEF 35-40%.  Indeterminate diastolic filling pattern.  Left atrial cavity is normal in size.  Mild tricuspid regurgitation.  No evidence of pulmonary hypertension.  Previous study on 10/04/2020 reported mod LA dilatation, grade 1 diastolic  dysfunction. No other significant change noted.   Echocardiogram 10/23/2022: Mildly depressed LV  systolic function with visual EF 40-45%. Left ventricle cavity is normal in size. Hypokinetic global wall motion. Doppler evidence of grade II (pseudonormal) diastolic dysfunction, elevated LAP. Calculated EF 40%. Left atrial cavity is mildly dilated. Mild (Grade I) mitral regurgitation. Mild mitral valve leaflet thickening. Structurally normal tricuspid valve.  Mild tricuspid regurgitation. No evidence of pulmonary hypertension. Compared to 12/2021, EF is slightly improved from 35-40%.    EKG   12/06/2021: normal sinus rhythm at a rate up 67 bpm. Left axis. LBBB, no further analysis. Compared to EKG 05/23/2021, no significant change.   06/14/2022: normal sinus, left axis. LBBB. No change compared to prior  Assessment     ICD-10-CM   1. Chronic systolic (congestive) heart failure (HCC)  I50.22 PCV ECHOCARDIOGRAM COMPLETE    2. Essential hypertension  I10     3. Nonischemic cardiomyopathy (HCC)  I42.8     4. Hypercholesteremia  E78.00       No orders of the defined types were placed in this encounter.  No orders of the defined types were placed in this encounter.   Recommendations:   Lindsey Rodriguez  is a 57 y.o. Caucasian female, with frequent PVCs and  new onset left bundle branch block and non ischemic cardiomyopathy diagnosed in 2017 with severe LV systolic dysfunction, EF has improved with medical therapy to around 40 to 45%.  She has hypertension and hyperlipidemia.  She has also been diagnosed with IgG lambda MGUS, small fiber neuropathy, anxiety and depression, panic attacks and severe degenerative L5S1 spine disease and also fibromyalgia. She underwent L4-S1 fusion in January 2022.   Essential hypertension, benign Continue current cardiac medications. Must be on coreg or toprol for cardiomyopathy - propranolol is ok to take PRN for social anxiety Encourage low-sodium diet, less than 2000 mg daily.   Chronic systolic heart failure, now HFmrEF 40-45% Nonischemic  cardiomyopathy EF now 40-45% Continue Jardiance, coreg, and entresto Repeat echocardiogram ordered to be done before next visit LVEF has slightly improved   Hyperlipidemia Patient tolerating pravastatin and zetia She has  not been able to tolerate any other lipid lowering medication  Follow-up in 6 months, sooner if needed.    Clotilde Dieter, DO, [facility] 11/02/2022, 8:54 AM Office: [phone]

## 2022-11-24 ENCOUNTER — Encounter: Payer: Self-pay | Admitting: Family Medicine

## 2022-11-24 ENCOUNTER — Ambulatory Visit: Payer: Medicare PPO | Admitting: Family Medicine

## 2022-11-24 ENCOUNTER — Ambulatory Visit (INDEPENDENT_AMBULATORY_CARE_PROVIDER_SITE_OTHER): Payer: Medicare PPO

## 2022-11-24 VITALS — BP 126/92 | HR 71 | Ht 66.0 in | Wt 205.6 lb

## 2022-11-24 DIAGNOSIS — M7731 Calcaneal spur, right foot: Secondary | ICD-10-CM | POA: Diagnosis not present

## 2022-11-24 DIAGNOSIS — M79671 Pain in right foot: Secondary | ICD-10-CM

## 2022-11-24 DIAGNOSIS — M25571 Pain in right ankle and joints of right foot: Secondary | ICD-10-CM

## 2022-11-24 NOTE — Progress Notes (Signed)
I, Stevenson Clinch, CMA acting as a scribe for [name], MD.  Lindsey Rodriguez is a 57 y.o. female who presents to [facility] today for 3-month f/u R foot pain. Pt was last seen by Dr. Denyse Amass on 10/27/22 and was prescribed prednisone, advised to use a CAM walker boot if needed, and was referred to [facility]. Today, pt report some improvement of sx with Prednisone. No longer in CAM walker. Did not make it to PT because her daughter came in from [place]. Swelling has gone down. Continues to have pain, burning sensation starts about 5 min into walk, also notes tingling. Notes worsening cramping sensation in the foot, deep.   Pain is significant at the anterior medial ankle and into the midfoot medially.  Dx imaging: 10/27/22 R foot XR  Pertinent review of systems: No fevers or chills  Relevant historical information: Migraine headaches.   Exam:  BP (!) 126/92   Pulse 71   Ht 5\' 6"  (1.676 m)   Wt 205 lb 9.6 oz (93.3 kg)   LMP 12/16/2012   SpO2 96%   BMI 33.18 kg/m  General: Well Developed, well nourished, and in no acute distress.   MSK: Right foot and ankle some swelling present medial anterior ankle and dorsal midfoot.  Tender palpation in these regions.  Intact range of motion intact strength.    Lab and Radiology Results  X-ray images right ankle obtained today personally and independently interpreted Osteochondral changes present at anterior ankle on lateral ankle x-ray could represent OCD lesion.  Mild degenerative changes present in the ankle. Await formal radiology review    Assessment and Plan: 57 y.o. female with right foot and ankle pain.  This is a chronic issue not improving with typical conservative management of home exercise program and immobilization.  She has more pain than I would expect based on her imaging.  Will proceed to MRI of the ankle to evaluate for OCD lesion or other cause of persistent problematic ankle pain.   Recheck after MRI.   PDMP not reviewed this encounter. Orders Placed This Encounter  Procedures   DG Ankle Complete Right    Standing Status:   Future    Number of Occurrences:   1    Standing Expiration Date:   11/24/2023    Order Specific Question:   Reason for Exam (SYMPTOM  OR DIAGNOSIS REQUIRED)    Answer:   right ankle pain    Order Specific Question:   Preferred imaging location?    Answer:   [facility]    Order Specific Question:   Is patient pregnant?    Answer:   No   MR ANKLE RIGHT WO CONTRAST    Standing Status:   Future    Standing Expiration Date:   11/24/2023    Order Specific Question:   What is the patient's sedation requirement?    Answer:   No Sedation    Order Specific Question:   Does the patient have a pacemaker or implanted devices?    Answer:   No    Order Specific Question:   Preferred imaging location?    Answer:   [facility] (table limit-550lbs)   No orders of the defined types were placed in this encounter.    Discussed warning signs or symptoms. Please see discharge instructions. Patient expresses understanding.   The above documentation has been reviewed and is accurate and complete [name], M.D.

## 2022-11-24 NOTE — Patient Instructions (Signed)
Thank you for coming in today.   Please get an Xray today before you leave \  You should hear from MRI scheduling within 1 week. If you do not hear please let me know.   

## 2022-11-28 NOTE — Progress Notes (Signed)
Right ankle x-ray shows heel spurs as well as calcific chronic Achilles tendinitis.  No fractures are visible.

## 2022-12-11 DIAGNOSIS — N281 Cyst of kidney, acquired: Secondary | ICD-10-CM | POA: Diagnosis not present

## 2022-12-11 DIAGNOSIS — N200 Calculus of kidney: Secondary | ICD-10-CM | POA: Diagnosis not present

## 2022-12-11 DIAGNOSIS — I10 Essential (primary) hypertension: Secondary | ICD-10-CM | POA: Diagnosis not present

## 2022-12-11 DIAGNOSIS — Z87891 Personal history of nicotine dependence: Secondary | ICD-10-CM | POA: Diagnosis not present

## 2022-12-11 DIAGNOSIS — R109 Unspecified abdominal pain: Secondary | ICD-10-CM | POA: Diagnosis not present

## 2022-12-11 DIAGNOSIS — K5730 Diverticulosis of large intestine without perforation or abscess without bleeding: Secondary | ICD-10-CM | POA: Diagnosis not present

## 2022-12-11 DIAGNOSIS — K760 Fatty (change of) liver, not elsewhere classified: Secondary | ICD-10-CM | POA: Diagnosis not present

## 2022-12-11 DIAGNOSIS — I2510 Atherosclerotic heart disease of native coronary artery without angina pectoris: Secondary | ICD-10-CM | POA: Diagnosis not present

## 2022-12-14 ENCOUNTER — Other Ambulatory Visit: Payer: Medicare PPO

## 2022-12-14 DIAGNOSIS — N200 Calculus of kidney: Secondary | ICD-10-CM | POA: Diagnosis not present

## 2022-12-20 NOTE — Progress Notes (Signed)
This encounter was created in error - please disregard.

## 2022-12-23 ENCOUNTER — Ambulatory Visit
Admission: RE | Admit: 2022-12-23 | Discharge: 2022-12-23 | Disposition: A | Discharge status: Home / Self Care | Payer: Medicare PPO | Source: Ambulatory Visit | Attending: Family Medicine | Admitting: Family Medicine

## 2022-12-23 DIAGNOSIS — M65871 Other synovitis and tenosynovitis, right ankle and foot: Secondary | ICD-10-CM | POA: Diagnosis not present

## 2022-12-23 DIAGNOSIS — M25571 Pain in right ankle and joints of right foot: Secondary | ICD-10-CM

## 2022-12-23 DIAGNOSIS — M67873 Other specified disorders of tendon, right ankle and foot: Secondary | ICD-10-CM | POA: Diagnosis not present

## 2022-12-26 ENCOUNTER — Encounter: Payer: Self-pay | Admitting: Family Medicine

## 2022-12-26 DIAGNOSIS — F32A Depression, unspecified: Secondary | ICD-10-CM | POA: Diagnosis not present

## 2022-12-26 DIAGNOSIS — Z1211 Encounter for screening for malignant neoplasm of colon: Secondary | ICD-10-CM | POA: Diagnosis not present

## 2022-12-26 DIAGNOSIS — F419 Anxiety disorder, unspecified: Secondary | ICD-10-CM | POA: Diagnosis not present

## 2022-12-26 DIAGNOSIS — E669 Obesity, unspecified: Secondary | ICD-10-CM | POA: Diagnosis not present

## 2022-12-26 DIAGNOSIS — K5900 Constipation, unspecified: Secondary | ICD-10-CM | POA: Diagnosis not present

## 2022-12-26 DIAGNOSIS — Z86010 Personal history of colonic polyps: Secondary | ICD-10-CM | POA: Diagnosis not present

## 2022-12-28 DIAGNOSIS — M62830 Muscle spasm of back: Secondary | ICD-10-CM | POA: Diagnosis not present

## 2022-12-28 DIAGNOSIS — M25522 Pain in left elbow: Secondary | ICD-10-CM | POA: Diagnosis not present

## 2022-12-28 DIAGNOSIS — M9905 Segmental and somatic dysfunction of pelvic region: Secondary | ICD-10-CM | POA: Diagnosis not present

## 2022-12-28 DIAGNOSIS — M9907 Segmental and somatic dysfunction of upper extremity: Secondary | ICD-10-CM | POA: Diagnosis not present

## 2022-12-28 DIAGNOSIS — M25571 Pain in right ankle and joints of right foot: Secondary | ICD-10-CM | POA: Diagnosis not present

## 2022-12-28 DIAGNOSIS — M5417 Radiculopathy, lumbosacral region: Secondary | ICD-10-CM | POA: Diagnosis not present

## 2022-12-28 DIAGNOSIS — M9906 Segmental and somatic dysfunction of lower extremity: Secondary | ICD-10-CM | POA: Diagnosis not present

## 2022-12-28 DIAGNOSIS — M9902 Segmental and somatic dysfunction of thoracic region: Secondary | ICD-10-CM | POA: Diagnosis not present

## 2022-12-28 DIAGNOSIS — M25552 Pain in left hip: Secondary | ICD-10-CM | POA: Diagnosis not present

## 2023-01-01 NOTE — Progress Notes (Signed)
Ankle MRI shows tendinitis and a partial tear of the tendon that makes her foot go up.  There is also tenderness multiple other tendons in the ankle.  Recommend return to clinic to talk about the results in full detail and treatment plan and options from here.

## 2023-01-08 NOTE — Progress Notes (Unsigned)
Lindsey Payor, PhD, LAT, ATC acting as a scribe for [name], MD.  Lindsey Rodriguez is a 57 y.o. female who presents to [facility] today for f/u R foot/ankle pain w/ MRI review. Pt was last seen by Dr. Denyse Amass on 11/24/22 and she was advised to proceed to MRI.   Today, pt reports R ankle/foot pain is improving a bit. She notes weakness and not as much pain.  Pt also c/o R knee pain ongoing since last week. MOI: She slipped on some melted ice in the kitchen, "twisting" her R knee and then landing on it on the floor. She notes R knee is "giving out" on her. Pain is making it difficult to sleep at night.   R Knee swelling: yes Mechanical symptoms: yes- catching Aggravates: too acute Treatments tried: ice, IBU  Dx imaging: 12/23/22 R ankle MRI 10/27/22 R foot XR   Pertinent review of systems: No fevers or chills  Relevant historical information: [facility].  Traveling to [place] in late August to attend her daughter's wedding.   Exam:  BP (!) 142/88   Pulse 60   Ht 5\' 6"  (1.676 m)   Wt 204 lb (92.5 kg)   LMP 12/16/2012   SpO2 99%   BMI 32.93 kg/m  General: Well Developed, well nourished, and in no acute distress.   MSK: Right knee mild effusion otherwise normal. Normal motion with crepitation. Tender palpation medial joint line. Stable ligamentous exam although exam accuracy is compromised by guarding. Positive medial Murray's test. Intact strength.  Right ankle: Normal appearing. Tender palpation along the course of the tibialis anterior tendon.    Lab and Radiology Results  Procedure: Real-time Ultrasound Guided Injection of right knee joint superior lateral patella space Device: Philips Affiniti 50G/GE Logiq Images permanently stored and available for review in PACS Verbal informed consent obtained.  Discussed risks and benefits of procedure. Warned about infection, bleeding, hyperglycemia damage to structures among others. Patient  expresses understanding and agreement Time-out conducted.   Noted no overlying erythema, induration, or other signs of local infection.   Skin prepped in a sterile fashion.   Local anesthesia: Topical Ethyl chloride.   With sterile technique and under real time ultrasound guidance: 40 mg of Kenalog and 2 mL of Marcaine injected into knee joint. Fluid seen entering the joint capsule.   Completed without difficulty   Pain immediately resolved suggesting accurate placement of the medication.   Advised to call if fevers/chills, erythema, induration, drainage, or persistent bleeding.   Images permanently stored and available for review in the ultrasound unit.  Impression: Technically successful ultrasound guided injection.    X-ray images right knee obtained today personally and independently interpreted My mild medial compartment DJD.  Chondrocalcinosis present in medial meniscus.  No acute fractures. Await formal radiology review   EXAM: MRI OF THE RIGHT ANKLE WITHOUT CONTRAST   TECHNIQUE: Multiplanar, multisequence MR imaging of the ankle was performed. No intravenous contrast was administered.   COMPARISON:  None Available.   FINDINGS: TENDONS   Peroneal: Mild tendinosis of the peroneus longus along the plantar lateral anterior calcaneus. Mild peroneal tenosynovitis. Peroneal brevis intact.   Posteromedial: Posterior tibial tendon intact. Flexor hallucis longus tendon intact. Flexor digitorum longus tendon intact. Mild tenosynovitis of the posterior tibial tendon.   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 Extensor hallucis longus tendon intact Extensor digitorum longus tendon intact.   Achilles:  Mild tendinosis of the  Achilles tendon.   Plantar Fascia: Intact.   LIGAMENTS   Lateral: Anterior talofibular ligament intact. Calcaneofibular ligament intact. Posterior talofibular ligament intact. Anterior and posterior  tibiofibular ligaments intact.   Medial: Deltoid ligament intact. Spring ligament intact.   CARTILAGE   Ankle Joint: No joint effusion. Normal ankle mortise. No chondral defect.   Subtalar Joints/Sinus Tarsi: Normal subtalar joints. No subtalar joint effusion. Normal sinus tarsi.   Bones: No aggressive osseous lesion. No fracture or dislocation. Mild subcortical extra-articular bone marrow edema in the lateral calcaneus and lateral talus. Small calcaneocuboid joint effusion.   Soft Tissue: No fluid collection or hematoma. Muscles are normal without edema or atrophy. Tarsal tunnel is normal.   IMPRESSION: 1. 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 2. Mild tendinosis of the peroneus longus along the plantar lateral anterior calcaneus. Mild peroneal tenosynovitis. 3. Mild tenosynovitis of the posterior tibial tendon. 4. Overall, the tenosynovial abnormalities can be seen in the setting of an inflammatory arthropathy. 5. Mild tendinosis of the Achilles tendon.     Electronically Signed   By: Elige Ko M.D.   On: 12/31/2022 12:16 I, [name], personally (independently) visualized and performed the interpretation of the images attached in this note.     Assessment and Plan: 57 y.o. female with right ankle pain.  Patient has tenosynovitis and partial tendon tear of the tibialis anterior tendon and tendinitis of multiple other tendons in the foot and ankle.  There is concern for inflammatory arthritis.  Plan for rheumatologic workup today and for trial of physical therapy.  If not improving will consider surgical consultation.  As for the knee pain this is a separate injury thought to be degenerative medial meniscus tear.  Plan for steroid injection today.  Recheck in about 5 weeks right before she leaves for her trip to [place] to attend her daughter's wedding.   PDMP not reviewed this encounter. Orders Placed This Encounter  Procedures    [place] LIMITED JOINT SPACE STRUCTURES LOW RIGHT(NO LINKED CHARGES)    Order Specific Question:   Reason for Exam (SYMPTOM  OR DIAGNOSIS REQUIRED)    Answer:   right knee pain    Order Specific Question:   Preferred imaging location?    Answer:    [facility] [place] Knee AP/LAT W/Sun[place] Right    Standing Status:   Future    Standing Expiration Date:   02/09/2023    Order Specific Question:   Reason for Exam (SYMPTOM  OR DIAGNOSIS REQUIRED)    Answer:   right knee pain    Order Specific Question:   Preferred imaging location?    Answer:   [facility]    Order Specific Question:   Is patient pregnant?    Answer:   No   Sedimentation rate    Standing Status:   Future    Standing Expiration Date:   01/09/2024   Rheumatoid factor    Standing Status:   Future    Standing Expiration Date:   01/09/2024   Cyclic citrul peptide antibody, IgG    Standing Status:   Future    Standing Expiration Date:   01/09/2024   ANA    Standing Status:   Future    Standing Expiration Date:   01/09/2024   CK    Standing Status:   Future    Standing Expiration Date:   01/09/2024   Complement, total    Standing Status:   Future  Standing Expiration Date:   01/09/2024   HLA-B27 antigen    Standing Status:   Future    Standing Expiration Date:   01/09/2024   Ambulatory referral to Physical Therapy    Referral Priority:   Routine    Referral Type:   Physical Medicine    Referral Reason:   Specialty Services Required    Requested Specialty:   Physical Therapy    Number of Visits Requested:   1   No orders of the defined types were placed in this encounter.    Discussed warning signs or symptoms. Please see discharge instructions. Patient expresses understanding.   The above documentation has been reviewed and is accurate and complete [name], M.D.

## 2023-01-09 ENCOUNTER — Ambulatory Visit (INDEPENDENT_AMBULATORY_CARE_PROVIDER_SITE_OTHER): Payer: Medicare PPO

## 2023-01-09 ENCOUNTER — Other Ambulatory Visit: Payer: Self-pay

## 2023-01-09 ENCOUNTER — Ambulatory Visit: Payer: Medicare PPO | Admitting: Family Medicine

## 2023-01-09 VITALS — BP 142/88 | HR 60 | Ht 66.0 in | Wt 204.0 lb

## 2023-01-09 DIAGNOSIS — N951 Menopausal and female climacteric states: Secondary | ICD-10-CM | POA: Diagnosis not present

## 2023-01-09 DIAGNOSIS — Z1589 Genetic susceptibility to other disease: Secondary | ICD-10-CM | POA: Diagnosis not present

## 2023-01-09 DIAGNOSIS — M79671 Pain in right foot: Secondary | ICD-10-CM | POA: Diagnosis not present

## 2023-01-09 DIAGNOSIS — E038 Other specified hypothyroidism: Secondary | ICD-10-CM | POA: Diagnosis not present

## 2023-01-09 DIAGNOSIS — D472 Monoclonal gammopathy: Secondary | ICD-10-CM | POA: Diagnosis not present

## 2023-01-09 DIAGNOSIS — M25561 Pain in right knee: Secondary | ICD-10-CM

## 2023-01-09 LAB — CK: Total CK: 105 U/L (ref 7–177)

## 2023-01-09 LAB — SEDIMENTATION RATE: Sed Rate: 7 mm/hr (ref 0–30)

## 2023-01-09 NOTE — Patient Instructions (Addendum)
Thank you for coming in today.   You received an injection today. Seek immediate medical attention if the joint becomes red, extremely painful, or is oozing fluid.   Please get labs today before you leave  I've referred you to Physical Therapy.  Let us know if you don't hear from them in one week.   Please get an Xray today before you leave   Check back mid-August

## 2023-01-11 DIAGNOSIS — E038 Other specified hypothyroidism: Secondary | ICD-10-CM | POA: Diagnosis not present

## 2023-01-11 DIAGNOSIS — N951 Menopausal and female climacteric states: Secondary | ICD-10-CM | POA: Diagnosis not present

## 2023-01-11 DIAGNOSIS — F419 Anxiety disorder, unspecified: Secondary | ICD-10-CM | POA: Diagnosis not present

## 2023-01-11 DIAGNOSIS — F329 Major depressive disorder, single episode, unspecified: Secondary | ICD-10-CM | POA: Diagnosis not present

## 2023-01-11 DIAGNOSIS — R748 Abnormal levels of other serum enzymes: Secondary | ICD-10-CM | POA: Diagnosis not present

## 2023-01-11 DIAGNOSIS — E782 Mixed hyperlipidemia: Secondary | ICD-10-CM | POA: Diagnosis not present

## 2023-01-11 DIAGNOSIS — I4510 Unspecified right bundle-branch block: Secondary | ICD-10-CM | POA: Diagnosis not present

## 2023-01-11 DIAGNOSIS — Z6832 Body mass index (BMI) 32.0-32.9, adult: Secondary | ICD-10-CM | POA: Diagnosis not present

## 2023-01-11 DIAGNOSIS — M5136 Other intervertebral disc degeneration, lumbar region: Secondary | ICD-10-CM | POA: Diagnosis not present

## 2023-01-12 ENCOUNTER — Encounter: Payer: Self-pay | Admitting: Family Medicine

## 2023-01-12 DIAGNOSIS — M9902 Segmental and somatic dysfunction of thoracic region: Secondary | ICD-10-CM | POA: Diagnosis not present

## 2023-01-12 DIAGNOSIS — M9906 Segmental and somatic dysfunction of lower extremity: Secondary | ICD-10-CM | POA: Diagnosis not present

## 2023-01-12 DIAGNOSIS — M9907 Segmental and somatic dysfunction of upper extremity: Secondary | ICD-10-CM | POA: Diagnosis not present

## 2023-01-12 DIAGNOSIS — M9905 Segmental and somatic dysfunction of pelvic region: Secondary | ICD-10-CM | POA: Diagnosis not present

## 2023-01-12 DIAGNOSIS — M25571 Pain in right ankle and joints of right foot: Secondary | ICD-10-CM | POA: Diagnosis not present

## 2023-01-12 DIAGNOSIS — M25522 Pain in left elbow: Secondary | ICD-10-CM | POA: Diagnosis not present

## 2023-01-12 DIAGNOSIS — M62830 Muscle spasm of back: Secondary | ICD-10-CM | POA: Diagnosis not present

## 2023-01-12 DIAGNOSIS — M5417 Radiculopathy, lumbosacral region: Secondary | ICD-10-CM | POA: Diagnosis not present

## 2023-01-12 DIAGNOSIS — M25552 Pain in left hip: Secondary | ICD-10-CM | POA: Diagnosis not present

## 2023-01-12 LAB — HLA-B27 ANTIGEN: HLA-B27 Antigen: POSITIVE — AB

## 2023-01-12 LAB — ANA: Anti Nuclear Antibody (ANA): NEGATIVE

## 2023-01-12 LAB — RHEUMATOID FACTOR: Rheumatoid fact SerPl-aCnc: <10 IU/mL (ref <14)

## 2023-01-12 LAB — CYCLIC CITRUL PEPTIDE ANTIBODY, IGG: Cyclic Citrullin Peptide Ab: <16 UNITS

## 2023-01-12 LAB — COMPLEMENT, TOTAL: Compl, Total (CH50): >60 U/mL — ABNORMAL HIGH (ref 31–60)

## 2023-01-12 NOTE — Addendum Note (Signed)
Addended by: Rodolph Bong on: 01/12/2023 01:00 PM   Modules accepted: Orders

## 2023-01-12 NOTE — Progress Notes (Signed)
Labs are positive.  HLA-B27 a lab that is somewhat specific for inflammatory arthritis is positive.  Complement level an immune lab is also positive.  I have referred you to a rheumatologist.  You should hear from [facility] rheumatology soon about scheduling an appointment.

## 2023-01-15 NOTE — Progress Notes (Signed)
Right knee x-ray shows some calcifications in the medial meniscus.  This could be due to arthritis or pseudogout.

## 2023-01-15 NOTE — Telephone Encounter (Signed)
Forwarding to Dr. Corey to review and advise.  

## 2023-01-16 ENCOUNTER — Other Ambulatory Visit: Payer: Self-pay | Admitting: Internal Medicine

## 2023-01-19 DIAGNOSIS — M9906 Segmental and somatic dysfunction of lower extremity: Secondary | ICD-10-CM | POA: Diagnosis not present

## 2023-01-19 DIAGNOSIS — M25571 Pain in right ankle and joints of right foot: Secondary | ICD-10-CM | POA: Diagnosis not present

## 2023-01-19 DIAGNOSIS — M25552 Pain in left hip: Secondary | ICD-10-CM | POA: Diagnosis not present

## 2023-01-19 DIAGNOSIS — M9905 Segmental and somatic dysfunction of pelvic region: Secondary | ICD-10-CM | POA: Diagnosis not present

## 2023-01-19 DIAGNOSIS — M5417 Radiculopathy, lumbosacral region: Secondary | ICD-10-CM | POA: Diagnosis not present

## 2023-01-19 DIAGNOSIS — M62830 Muscle spasm of back: Secondary | ICD-10-CM | POA: Diagnosis not present

## 2023-01-19 DIAGNOSIS — M9907 Segmental and somatic dysfunction of upper extremity: Secondary | ICD-10-CM | POA: Diagnosis not present

## 2023-01-19 DIAGNOSIS — M25522 Pain in left elbow: Secondary | ICD-10-CM | POA: Diagnosis not present

## 2023-01-19 DIAGNOSIS — M9902 Segmental and somatic dysfunction of thoracic region: Secondary | ICD-10-CM | POA: Diagnosis not present

## 2023-01-24 ENCOUNTER — Other Ambulatory Visit: Payer: Self-pay | Admitting: Internal Medicine

## 2023-01-24 DIAGNOSIS — M546 Pain in thoracic spine: Secondary | ICD-10-CM | POA: Diagnosis not present

## 2023-01-24 DIAGNOSIS — M533 Sacrococcygeal disorders, not elsewhere classified: Secondary | ICD-10-CM | POA: Diagnosis not present

## 2023-01-24 DIAGNOSIS — M1990 Unspecified osteoarthritis, unspecified site: Secondary | ICD-10-CM | POA: Diagnosis not present

## 2023-01-24 DIAGNOSIS — M459 Ankylosing spondylitis of unspecified sites in spine: Secondary | ICD-10-CM | POA: Diagnosis not present

## 2023-01-24 DIAGNOSIS — M545 Low back pain, unspecified: Secondary | ICD-10-CM | POA: Diagnosis not present

## 2023-01-24 DIAGNOSIS — M797 Fibromyalgia: Secondary | ICD-10-CM | POA: Diagnosis not present

## 2023-02-02 DIAGNOSIS — M25571 Pain in right ankle and joints of right foot: Secondary | ICD-10-CM | POA: Diagnosis not present

## 2023-02-02 DIAGNOSIS — M9902 Segmental and somatic dysfunction of thoracic region: Secondary | ICD-10-CM | POA: Diagnosis not present

## 2023-02-02 DIAGNOSIS — M9906 Segmental and somatic dysfunction of lower extremity: Secondary | ICD-10-CM | POA: Diagnosis not present

## 2023-02-02 DIAGNOSIS — M62830 Muscle spasm of back: Secondary | ICD-10-CM | POA: Diagnosis not present

## 2023-02-02 DIAGNOSIS — M25552 Pain in left hip: Secondary | ICD-10-CM | POA: Diagnosis not present

## 2023-02-02 DIAGNOSIS — M9905 Segmental and somatic dysfunction of pelvic region: Secondary | ICD-10-CM | POA: Diagnosis not present

## 2023-02-02 DIAGNOSIS — M9907 Segmental and somatic dysfunction of upper extremity: Secondary | ICD-10-CM | POA: Diagnosis not present

## 2023-02-02 DIAGNOSIS — M5417 Radiculopathy, lumbosacral region: Secondary | ICD-10-CM | POA: Diagnosis not present

## 2023-02-02 DIAGNOSIS — M25522 Pain in left elbow: Secondary | ICD-10-CM | POA: Diagnosis not present

## 2023-02-09 ENCOUNTER — Ambulatory Visit: Payer: Medicare PPO | Admitting: Family Medicine

## 2023-02-09 DIAGNOSIS — M9902 Segmental and somatic dysfunction of thoracic region: Secondary | ICD-10-CM | POA: Diagnosis not present

## 2023-02-09 DIAGNOSIS — M25552 Pain in left hip: Secondary | ICD-10-CM | POA: Diagnosis not present

## 2023-02-09 DIAGNOSIS — M5417 Radiculopathy, lumbosacral region: Secondary | ICD-10-CM | POA: Diagnosis not present

## 2023-02-09 DIAGNOSIS — M9907 Segmental and somatic dysfunction of upper extremity: Secondary | ICD-10-CM | POA: Diagnosis not present

## 2023-02-09 DIAGNOSIS — M9905 Segmental and somatic dysfunction of pelvic region: Secondary | ICD-10-CM | POA: Diagnosis not present

## 2023-02-09 DIAGNOSIS — M9906 Segmental and somatic dysfunction of lower extremity: Secondary | ICD-10-CM | POA: Diagnosis not present

## 2023-02-09 DIAGNOSIS — M25571 Pain in right ankle and joints of right foot: Secondary | ICD-10-CM | POA: Diagnosis not present

## 2023-02-09 DIAGNOSIS — M62830 Muscle spasm of back: Secondary | ICD-10-CM | POA: Diagnosis not present

## 2023-02-09 DIAGNOSIS — M25522 Pain in left elbow: Secondary | ICD-10-CM | POA: Diagnosis not present

## 2023-02-16 DIAGNOSIS — M25522 Pain in left elbow: Secondary | ICD-10-CM | POA: Diagnosis not present

## 2023-02-16 DIAGNOSIS — M9905 Segmental and somatic dysfunction of pelvic region: Secondary | ICD-10-CM | POA: Diagnosis not present

## 2023-02-16 DIAGNOSIS — M9902 Segmental and somatic dysfunction of thoracic region: Secondary | ICD-10-CM | POA: Diagnosis not present

## 2023-02-16 DIAGNOSIS — M9907 Segmental and somatic dysfunction of upper extremity: Secondary | ICD-10-CM | POA: Diagnosis not present

## 2023-02-16 DIAGNOSIS — M62830 Muscle spasm of back: Secondary | ICD-10-CM | POA: Diagnosis not present

## 2023-02-16 DIAGNOSIS — M25552 Pain in left hip: Secondary | ICD-10-CM | POA: Diagnosis not present

## 2023-02-16 DIAGNOSIS — M25571 Pain in right ankle and joints of right foot: Secondary | ICD-10-CM | POA: Diagnosis not present

## 2023-02-16 DIAGNOSIS — M9906 Segmental and somatic dysfunction of lower extremity: Secondary | ICD-10-CM | POA: Diagnosis not present

## 2023-02-16 DIAGNOSIS — M5417 Radiculopathy, lumbosacral region: Secondary | ICD-10-CM | POA: Diagnosis not present

## 2023-03-02 DIAGNOSIS — M9902 Segmental and somatic dysfunction of thoracic region: Secondary | ICD-10-CM | POA: Diagnosis not present

## 2023-03-02 DIAGNOSIS — M62830 Muscle spasm of back: Secondary | ICD-10-CM | POA: Diagnosis not present

## 2023-03-02 DIAGNOSIS — M25522 Pain in left elbow: Secondary | ICD-10-CM | POA: Diagnosis not present

## 2023-03-02 DIAGNOSIS — M25552 Pain in left hip: Secondary | ICD-10-CM | POA: Diagnosis not present

## 2023-03-02 DIAGNOSIS — M9905 Segmental and somatic dysfunction of pelvic region: Secondary | ICD-10-CM | POA: Diagnosis not present

## 2023-03-02 DIAGNOSIS — M9907 Segmental and somatic dysfunction of upper extremity: Secondary | ICD-10-CM | POA: Diagnosis not present

## 2023-03-02 DIAGNOSIS — M25571 Pain in right ankle and joints of right foot: Secondary | ICD-10-CM | POA: Diagnosis not present

## 2023-03-02 DIAGNOSIS — M9906 Segmental and somatic dysfunction of lower extremity: Secondary | ICD-10-CM | POA: Diagnosis not present

## 2023-03-02 DIAGNOSIS — M5417 Radiculopathy, lumbosacral region: Secondary | ICD-10-CM | POA: Diagnosis not present

## 2023-03-04 ENCOUNTER — Encounter: Payer: Self-pay | Admitting: Cardiology

## 2023-03-07 NOTE — Telephone Encounter (Signed)
From patient.

## 2023-03-12 ENCOUNTER — Encounter: Payer: Self-pay | Admitting: Cardiology

## 2023-03-12 NOTE — Telephone Encounter (Signed)
From patient.

## 2023-04-13 ENCOUNTER — Telehealth: Payer: Medicare PPO | Admitting: Nurse Practitioner

## 2023-04-13 ENCOUNTER — Encounter: Payer: Self-pay | Admitting: Nurse Practitioner

## 2023-04-13 VITALS — BP 140/98 | HR 73 | Wt 190.0 lb

## 2023-04-13 DIAGNOSIS — B3731 Acute candidiasis of vulva and vagina: Secondary | ICD-10-CM

## 2023-04-13 DIAGNOSIS — H6692 Otitis media, unspecified, left ear: Secondary | ICD-10-CM

## 2023-04-13 DIAGNOSIS — J0140 Acute pansinusitis, unspecified: Secondary | ICD-10-CM

## 2023-04-13 MED ORDER — FLUCONAZOLE 150 MG PO TABS
150.0000 mg | ORAL_TABLET | Freq: Once | ORAL | 2 refills | Status: AC
Start: 2023-04-13 — End: 2023-04-13

## 2023-04-13 MED ORDER — IBUPROFEN 800 MG PO TABS
800.0000 mg | ORAL_TABLET | Freq: Three times a day (TID) | ORAL | 0 refills | Status: DC | PRN
Start: 2023-04-13 — End: 2023-05-22

## 2023-04-13 MED ORDER — CEPHALEXIN 500 MG PO CAPS
500.0000 mg | ORAL_CAPSULE | Freq: Two times a day (BID) | ORAL | 0 refills | Status: DC
Start: 2023-04-13 — End: 2023-05-22

## 2023-04-13 NOTE — Assessment & Plan Note (Signed)
Left sided otitis with no drainage in the setting of severe sore throat and congestion. I suspect infectious source given that symptoms have been present and progressive for over a week. No evidence of performed TM at this time.  -Start Keflex (Cephalexin) to cover for sinus infection and potential strep throat. -Continue Mucinex for congestion. -Advise alternating ibuprofen and Tylenol for pain and fever. -Advise warm salt water gargles for sore throat. -Advise rest and hydration. -Provide Diflucan (Fluconazole) prophylaxis for potential yeast infection due to antibiotic use. -Advise patient to contact the office if symptoms worsen or do not improve.

## 2023-04-13 NOTE — Patient Instructions (Signed)
VISIT SUMMARY:  During your visit, we discussed your symptoms of a severe sore throat, sinus pain and pressure, ear pain, low-grade fever, upper airway congestion, and yellow-green mucus. These symptoms suggest you may have an upper respiratory infection, possibly a sinus infection and strep throat. We also noted that you have a significant allergy to penicillin and a past rash with erythromycin, but you have previously tolerated azithromycin, cefixime, and cephalexin.  YOUR PLAN:  -UPPER RESPIRATORY INFECTION: An upper respiratory infection is an infection that may involve the nose, sinuses, pharynx, or larynx. This is commonly referred to as a sinus infection, ear infection, or strep throat. We will start you on Keflex (Cephalexin) to treat the infection. Continue taking Mucinex for congestion. For pain and fever, alternate between ibuprofen and Tylenol. I have sent 800mg  ibuprofen to the pharmacy with your other medications. Warm salt water gargles can help soothe your sore throat. Rest and hydration are also important for recovery. We will also provide Diflucan (Fluconazole) as a preventive measure against potential yeast infection due to antibiotic use.  INSTRUCTIONS:  Please start taking Keflex (Cephalexin) as prescribed. Continue with Mucinex for congestion. For pain and fever, you can alternate between ibuprofen and Tylenol. Try warm salt water gargles for your sore throat. Make sure to rest and stay hydrated. Take Diflucan (Fluconazole) as prescribed to prevent potential yeast infection due to antibiotic use. If your symptoms worsen or do not improve, please contact our office immediately.

## 2023-04-13 NOTE — Progress Notes (Signed)
Virtual Visit Encounter mychart visit.   I connected with  Lindsey Rodriguez on 04/13/23 at  8:30 AM EDT by secure video and audio telemedicine application. I verified that I am speaking with the correct person using two identifiers.   I introduced myself as a Publishing rights manager with the practice. The limitations of evaluation and management by telemedicine discussed with the patient and the availability of in person appointments. The patient expressed verbal understanding and consent to proceed.  Participating parties in this visit include: Myself and patient  The patient is: Patient Location: Home I am: Provider Location: Office/Clinic Subjective:    CC and HPI: Lindsey Rodriguez is a 57 y.o. year old female presenting for new evaluation and treatment of sinus/otitis/throat symptoms.  History of Present Illness Lindsey Rodriguez, a 57 year old female with a history of seasonal allergies, presents with symptoms suggestive of a sinus infection and possible strep throat. The symptoms began approximately a week ago with seasonal allergies, which worsened significantly on the morning of the fourth day, when the patient woke up unable to speak due to a severe sore throat. The patient describes the throat pain as feeling like "broken glass" when swallowing.  In addition to the severe sore throat, the patient reports sinus pain and pressure, left sided ear pain, low-grade fever, upper airway congestion, and yellow-green mucus. The left side appears to be more affected, with swelling and tenderness under the jaw and down the neck, and the left ear feeling more "stopped up" and itchy than the right. The patient denies any drainage from the ear.  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 However, due to the nature of her work, exposure cannot be ruled out. The patient has a significant allergy to penicillin and a past rash with erythromycin but  has previously tolerated azithromycin, cefixime, and cephalexin. The patient also reports a history of yeast infections following antibiotic use.  The patient's last meal was the day before the consultation due to nausea from sinus drainage. The patient has been maintaining hydration. The patient's husband has looked at the patient's throat and did not observe any white or yellow buildup on the tonsils. However, the patient reports swelling and tenderness on the left side of the throat.   Past medical history, Surgical history, Family history not pertinant except as noted below, Social history, Allergies, and medications have been entered into the medical record, reviewed, and corrections made.   Review of Systems:  All review of systems negative except what is listed in the HPI  Objective:    Alert and oriented x 4 Audibly congested.  Speaking in clear sentences with no shortness of breath. Ill appearing but no distress.  Impression and Recommendations:    Problem List Items Addressed This Visit     Acute non-recurrent pansinusitis - Primary    Severe sore throat, sinus pain and pressure, ear pain, low grade fever, upper airway congestion, and yellow green mucus. Symptoms started over a week ago, initially as seasonal allergies, but worsened significantly. Left-sided tenderness and swelling under the jaw, with more pronounced symptoms on the left side. No visible exudate in the throat. 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 -Start Keflex (Cephalexin) to cover for sinus infection and potential strep throat. -Continue Mucinex for congestion. -Advise alternating ibuprofen and Tylenol for pain and fever. -Advise warm salt water gargles for sore throat. -Advise rest and hydration. -Provide Diflucan (  Fluconazole) prophylaxis for potential yeast infection due to antibiotic use. -Advise patient to contact the office if symptoms worsen or do not improve.       Relevant Medications   cephALEXin (KEFLEX) 500 MG capsule   fluconazole (DIFLUCAN) 150 MG tablet   ibuprofen (ADVIL) 800 MG tablet   Otitis of left ear    Left sided otitis with no drainage in the setting of severe sore throat and congestion. I suspect infectious source given that symptoms have been present and progressive for over a week. No evidence of performed TM at this time.  -Start Keflex (Cephalexin) to cover for sinus infection and potential strep throat. -Continue Mucinex for congestion. -Advise alternating ibuprofen and Tylenol for pain and fever. -Advise warm salt water gargles for sore throat. -Advise rest and hydration. -Provide Diflucan (Fluconazole) prophylaxis for potential yeast infection due to antibiotic use. -Advise patient to contact the office if symptoms worsen or do not improve.       Relevant Medications   cephALEXin (KEFLEX) 500 MG capsule   fluconazole (DIFLUCAN) 150 MG tablet   ibuprofen (ADVIL) 800 MG tablet   Vaginal yeast infection    Lindsey Rodriguez typically observes a yeast infection with antibiotic use. I will send in fluconazole for treatment in the event she presents with symptoms given this historical finding.  -fluconazole with allowance for 2 doses sent.       Relevant Medications   cephALEXin (KEFLEX) 500 MG capsule   fluconazole (DIFLUCAN) 150 MG tablet    orders and follow up as documented in EMR I discussed the assessment and treatment plan with the patient. The patient was provided an opportunity to ask questions and all were answered. The patient agreed with the plan and demonstrated an understanding of the instructions.   The patient was advised to call back or seek an in-person evaluation if the symptoms worsen or if the condition fails to improve as anticipated.  Follow-Up: prn  I provided 19 minutes of non-face-to-face interaction with this non face-to-face encounter including intake, same-day documentation, and chart review.   Tollie Eth, NP , DNP, AGNP-c [facility]

## 2023-04-13 NOTE — Assessment & Plan Note (Signed)
Severe sore throat, sinus pain and pressure, ear pain, low grade fever, upper airway congestion, and yellow green mucus. Symptoms started over a week ago, initially as seasonal allergies, but worsened significantly. Left-sided tenderness and swelling under the jaw, with more pronounced symptoms on the left side. No visible exudate in the throat. 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 -Start Keflex (Cephalexin) to cover for sinus infection and potential strep throat. -Continue Mucinex for congestion. -Advise alternating ibuprofen and Tylenol for pain and fever. -Advise warm salt water gargles for sore throat. -Advise rest and hydration. -Provide Diflucan (Fluconazole) prophylaxis for potential yeast infection due to antibiotic use. -Advise patient to contact the office if symptoms worsen or do not improve.

## 2023-04-13 NOTE — Assessment & Plan Note (Signed)
Mazzy typically observes a yeast infection with antibiotic use. I will send in fluconazole for treatment in the event she presents with symptoms given this historical finding.  -fluconazole with allowance for 2 doses sent.

## 2023-04-25 ENCOUNTER — Other Ambulatory Visit: Payer: Self-pay | Admitting: Family Medicine

## 2023-04-25 DIAGNOSIS — E782 Mixed hyperlipidemia: Secondary | ICD-10-CM

## 2023-04-25 NOTE — Telephone Encounter (Signed)
Is this okay, can't see where you've done the zetia before.

## 2023-04-25 NOTE — Telephone Encounter (Signed)
We do the zetia--if you look in history of meds, was last rx'd for a year 10/25--for some reason it had an end date, and was taken out, so it was put back in as historical (bc rx not needed, but was off list).  She has yearly visit scheduled for 11/20. Can't have year supply as requested. Just #90, no refill

## 2023-04-26 ENCOUNTER — Other Ambulatory Visit: Payer: Self-pay

## 2023-04-26 DIAGNOSIS — D472 Monoclonal gammopathy: Secondary | ICD-10-CM

## 2023-04-30 ENCOUNTER — Inpatient Hospital Stay: Payer: Medicare PPO | Attending: Hematology

## 2023-04-30 DIAGNOSIS — D472 Monoclonal gammopathy: Secondary | ICD-10-CM | POA: Insufficient documentation

## 2023-04-30 LAB — CBC WITH DIFFERENTIAL (CANCER CENTER ONLY)
Abs Immature Granulocytes: 0 10*3/uL (ref 0.00–0.07)
Basophils Absolute: 0 10*3/uL (ref 0.0–0.1)
Basophils Relative: 1 %
Eosinophils Absolute: 0.1 10*3/uL (ref 0.0–0.5)
Eosinophils Relative: 2 %
HCT: 37.2 % (ref 36.0–46.0)
Hemoglobin: 13 g/dL (ref 12.0–15.0)
Immature Granulocytes: 0 %
Lymphocytes Relative: 55 %
Lymphs Abs: 2 10*3/uL (ref 0.7–4.0)
MCH: 35.1 pg — ABNORMAL HIGH (ref 26.0–34.0)
MCHC: 34.9 g/dL (ref 30.0–36.0)
MCV: 100.5 fL — ABNORMAL HIGH (ref 80.0–100.0)
Monocytes Absolute: 0.3 10*3/uL (ref 0.1–1.0)
Monocytes Relative: 8 %
Neutro Abs: 1.3 10*3/uL — ABNORMAL LOW (ref 1.7–7.7)
Neutrophils Relative %: 34 %
Platelet Count: 249 10*3/uL (ref 150–400)
RBC: 3.7 MIL/uL — ABNORMAL LOW (ref 3.87–5.11)
RDW: 12.4 % (ref 11.5–15.5)
WBC Count: 3.7 10*3/uL — ABNORMAL LOW (ref 4.0–10.5)
nRBC: 0 % (ref 0.0–0.2)

## 2023-04-30 LAB — CMP (CANCER CENTER ONLY)
ALT: 39 U/L (ref 0–44)
AST: 30 U/L (ref 15–41)
Albumin: 4.5 g/dL (ref 3.5–5.0)
Alkaline Phosphatase: 69 U/L (ref 38–126)
Anion gap: 5 (ref 5–15)
BUN: 11 mg/dL (ref 6–20)
CO2: 27 mmol/L (ref 22–32)
Calcium: 9.5 mg/dL (ref 8.9–10.3)
Chloride: 105 mmol/L (ref 98–111)
Creatinine: 0.85 mg/dL (ref 0.44–1.00)
GFR, Estimated: >60 mL/min (ref >60)
Glucose, Bld: 118 mg/dL — ABNORMAL HIGH (ref 70–99)
Potassium: 4 mmol/L (ref 3.5–5.1)
Sodium: 137 mmol/L (ref 135–145)
Total Bilirubin: 0.4 mg/dL (ref 0.3–1.2)
Total Protein: 7.4 g/dL (ref 6.5–8.1)

## 2023-05-03 LAB — MULTIPLE MYELOMA PANEL, SERUM
Albumin SerPl Elph-Mcnc: 3.9 g/dL (ref 2.9–4.4)
Albumin/Glob SerPl: 1.4 (ref 0.7–1.7)
Alpha 1: 0.1 g/dL (ref 0.0–0.4)
Alpha2 Glob SerPl Elph-Mcnc: 0.7 g/dL (ref 0.4–1.0)
B-Globulin SerPl Elph-Mcnc: 0.9 g/dL (ref 0.7–1.3)
Gamma Glob SerPl Elph-Mcnc: 1.2 g/dL (ref 0.4–1.8)
Globulin, Total: 2.9 g/dL (ref 2.2–3.9)
IgA: 63 mg/dL — ABNORMAL LOW (ref 87–352)
IgG (Immunoglobin G), Serum: 1370 mg/dL (ref 586–1602)
IgM (Immunoglobulin M), Srm: 53 mg/dL (ref 26–217)
M Protein SerPl Elph-Mcnc: 0.8 g/dL — ABNORMAL HIGH
Total Protein ELP: 6.8 g/dL (ref 6.0–8.5)

## 2023-05-04 ENCOUNTER — Inpatient Hospital Stay: Payer: Medicare PPO | Attending: Hematology | Admitting: Hematology

## 2023-05-04 DIAGNOSIS — D472 Monoclonal gammopathy: Secondary | ICD-10-CM | POA: Diagnosis not present

## 2023-05-04 DIAGNOSIS — E538 Deficiency of other specified B group vitamins: Secondary | ICD-10-CM | POA: Diagnosis not present

## 2023-05-04 NOTE — Progress Notes (Signed)
HEMATOLOGY ONCOLOGY PHONE VISIT NOTE  Date of service: 05/04/23  Patient Care Team: Joselyn Arrow, MD as PCP - General (Family Medicine) Drema Dallas, DO as Consulting Physician (Neurology) Charna Elizabeth, MD as Consulting Physician (Gastroenterology) Yates Decamp, MD as Consulting Physician (Cardiology) Johney Maine, MD as Consulting Physician (Hematology) Patricia Nettle, MD (Orthopedic Surgery) Letta Kocher, MD (Rehabilitation) Maeola Harman, MD as Consulting Physician (Neurosurgery) Karenann Cai, MD as Consulting Physician (Neurosurgery) Lavada Mesi, MD (Family Medicine) Kathryne Hitch, MD as Consulting Physician (Orthopedic Surgery) Elmon Else, MD as Consulting Physician (Dermatology) Puschinsky, Adelfa Koh., MD (General Surgery) Milagros Evener, MD as Consulting Physician (Psychiatry) Thurnell Garbe, OD (Optometry) Adele Dan, DMD (Dentistry) Dr Yates Decamp MD (cardiology) Dr Maeola Harman - neurosurgery Dr Pollyann Savoy MD -Rheumatology  Diagnosis: #1 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 Skeletal survey negative. Bone marrow deferred as per patient choice.  Current Treatment: observation  INTERVAL HISTORY: Lindsey Rodriguez is here for continued follow-up for IgG lambda MGUS. I had a phone visit with patient on 10/31/2022 and reported extreme fatigue managed by her cardiologist.   I connected with Lindsey Rodriguez on 05/04/23 at  8:40 AM EDT by telephone visit and verified that I am speaking with the correct person using two identifiers.   I discussed the limitations, risks, security and privacy concerns of performing an evaluation and management service by telemedicine and the availability of in-person appointments. I also discussed with the patient that there may be a patient responsible charge related to this service. The patient expressed understanding and agreed to proceed.   Other persons participating  in the visit and their role in the encounter: none   Patient's location: home  Provider's location: [facility]   Chief Complaint: Evaluation and management of IgG lambda MGUS.    Today, she reports that she has been doing well overall since her last visit. She reports no new symptoms. No new bone pain that is new or different, no infection issues, and no other new localized symptoms.   Patient notes that she was told that she was B12 deficient by her PCP and had questions about B12 and B vitamin replacements. She also mentioned that her PCP suggested that she might have pernicious anemia, though she is not aware of any antibody testing that was done to confirm this diagnosis. We talked about sublingual B12 replacement and liquid liposomal B complex replacement   We discussed checking antibodies with her next labs in 6 months.   REVIEW OF SYSTEMS:    10 Point review of Systems was done is negative except as noted above.    Past Medical History:  Diagnosis Date   Anemia    Anxiety panic attacks   Dr. Evelene Croon   Arthritis    knees   Cardiomyopathy, nonischemic (HCC) 04/07/2016   EF left ventricule calculated on Lexiscan stress test at 33% with global hypokinesis.    Chronic back pain    Chronic neck pain    COVID-19 06/2021   treated with Paxlovid through [facility] in [place]   Depression    Dysrhythmia    pvc's with cardiomyopathy   Family history of adverse reaction to anesthesia    mother has problems waking up   Fibromyalgia    and neuropathy of feet   History of kidney stones    lithotripsy  03/2016   Hyperlipidemia    Hypertension    Kidney stone    History   Kidney stones  h/o   Melanoma (HCC) RUE   Dr. Emily Filbert   MGUS (monoclonal gammopathy of unknown significance) 06/2015   no treatment just watching recheck blood work 12/2015   Migraine    OTC med prn - stress related   Plantar fasciitis right   Renal stones 2008/2011/2017   Seasonal allergies    SVD (spontaneous  vaginal delivery)    x 3   Tendonitis of foot right    Past Surgical History:  Procedure Laterality Date   ABLATION ON ENDOMETRIOSIS N/A 11/24/2015   Procedure: ABLATION ON ENDOMETRIOSIS;  Surgeon: Carrington Clamp, MD;  Location: [facility];  Service: Gynecology;  Laterality: N/A;   ANTERIOR LAT LUMBAR FUSION Right 06/15/2016   Procedure: RIGHT LUMBAR THREE-FOUR ANTEROLATERAL LUMBAR INTERBODY FUSION WITH LATERAL PLATE;  Surgeon: Maeola Harman, MD;  Location: [facility] OR;  Service: Neurosurgery;  Laterality: Right;   BACK SURGERY  Dr.Stern, last sx Summer 2011   X 4  (L3-S1)   BACK SURGERY  06/2016   BACK SURGERY  07/20/2020   L2-3 lateral interbody (Left side approach), L2-3 Posterior Spinal Fusion at [facility]   BLADDER SUSPENSION N/A 11/24/2015   Procedure: TRANSVAGINAL TAPE (TVT) PROCEDURE;  Surgeon: Carrington Clamp, MD;  Location: [facility];  Service: Gynecology;  Laterality: N/A;   C2-3 nerve ablation Right 06/2019   Dr. Retia Passe (per pt)   COLONOSCOPY     CYSTOCELE REPAIR  11/24/2015   Procedure: ANTERIOR REPAIR (CYSTOCELE);  Surgeon: Carrington Clamp, MD;  Location: [facility];  Service: Gynecology;;   CYSTOSCOPY N/A 11/24/2015   Procedure: Bluford Kaufmann;  Surgeon: Carrington Clamp, MD;  Location: [facility];  Service: Gynecology;  Laterality: N/A;   DILATION AND CURETTAGE OF UTERUS     ENDOMETRIAL ABLATION  08/2000   EYE SURGERY Bilateral    Lasik    KIDNEY STONE SURGERY  2011   LITHOTRIPSY  08/2017 (L), 03/2018 (R)   MELANOMA EXCISION  05/2015   right forearm; Dr. Emily Filbert   NECK SURGERY  [name]  Summer 2011   X 4  (C4-T1 Level)   RHINOPLASTY     ROBOTIC ASSISTED BILATERAL SALPINGO OOPHERECTOMY Bilateral 11/24/2015   Procedure: ROBOTIC ASSISTED BILATERAL SALPINGO OOPHORECTOMY;  Surgeon: Carrington Clamp, MD;  Location: [facility];  Service: Gynecology;  Laterality: Bilateral;--OVARIES NOT REMOVED PER PATH REPORT   ROBOTIC ASSISTED TOTAL HYSTERECTOMY WITH SALPINGECTOMY Bilateral 11/24/2015   Procedure:  ROBOTIC ASSISTED TOTAL HYSTERECTOMY WITH SALPINGECTOMY;  Surgeon: Carrington Clamp, MD;  Location: [facility];  Service: Gynecology;  Laterality: Bilateral;   TONSILLECTOMY     TOTAL VAGINAL HYSTERECTOMY     WISDOM TOOTH EXTRACTION      Social History   Tobacco Use   Smoking status: Former    Current packs/day: 0.00    Average packs/day: 0.1 packs/day for 3.0 years (0.3 ttl pk-yrs)    Types: Cigarettes    Start date: 07/03/1984    Quit date: 07/04/1987    Years since quitting: 35.8   Smokeless tobacco: Never  Vaping Use   Vaping status: Never Used  Substance Use Topics   Alcohol use: Yes    Comment: 4/week   Drug use: No   ALLERGIES:  is allergic to penicillins, codeine, voltaren [diclofenac], crestor [rosuvastatin], diclofenac sodium, vytorin [ezetimibe-simvastatin], and erythromycin.  MEDICATIONS:  Current Outpatient Medications  Medication Sig Dispense Refill   buPROPion (WELLBUTRIN XL) 150 MG 24 hr tablet Take by mouth.     carvedilol (COREG) 3.125 MG tablet Take 1 tablet (3.125 mg total) by mouth 2 (two)  times daily with a meal. 60 tablet 10   cephALEXin (KEFLEX) 500 MG capsule Take 1 capsule (500 mg total) by mouth 2 (two) times daily. 14 capsule 0   ENTRESTO 24-26 MG TAKE 1 TABLET BY MOUTH TWICE DAILY (Patient not taking: Reported on 04/13/2023) 60 tablet 6   EPINEPHrine 0.3 mg/0.3 mL IJ SOAJ injection Inject 0.3 mg into the muscle as needed for anaphylaxis. 1 each 0   ezetimibe (ZETIA) 10 MG tablet TAKE 1 TABLET BY MOUTH DAILY 90 tablet 0   FLUoxetine (PROZAC) 40 MG capsule Take 2 capsules by mouth daily.     ibuprofen (ADVIL) 800 MG tablet Take 1 tablet (800 mg total) by mouth every 8 (eight) hours as needed. 30 tablet 0   JARDIANCE 10 MG TABS tablet TAKE 1 TABLET BY MOUTH DAILY BEFORE BREAKFAST 30 tablet 6   loratadine (CLARITIN) 10 MG tablet Take 10 mg by mouth daily.     LORazepam (ATIVAN) 0.5 MG tablet Take 1-2 tablets (0.5-1 mg total) by mouth 3 (three) times daily as  needed for anxiety. Prn anxiety 30 tablet 0   pravastatin (PRAVACHOL) 40 MG tablet TAKE 1 TABLET BY MOUTH DAILY 90 tablet 0   tiZANidine (ZANAFLEX) 2 MG tablet Take 1 tablet (2 mg total) by mouth every 6 (six) hours as needed for muscle spasms. 30 tablet 11   No current facility-administered medications for this visit.    PHYSICAL EXAMINATION: TELEMEDICINE VISIT  LABORATORY DATA:   I have reviewed the data as listed  .    Latest Ref Rng & Units 04/30/2023    8:01 AM 10/23/2022    9:35 AM 04/06/2022    9:35 AM  CBC  WBC 4.0 - 10.5 K/uL 3.7  4.3  4.1   Hemoglobin 12.0 - 15.0 g/dL 66.4  40.3  47.4   Hematocrit 36.0 - 46.0 % 37.2  39.8  38.5   Platelets 150 - 400 K/uL 249  226  202     .    Latest Ref Rng & Units 04/30/2023    8:01 AM 10/23/2022    9:35 AM 07/11/2022    8:10 AM  CMP  Glucose 70 - 99 mg/dL 259  91  563   BUN 6 - 20 mg/dL 11  12  17    Creatinine 0.44 - 1.00 mg/dL 8.75  6.43  3.29   Sodium 135 - 145 mmol/L 137  138  137   Potassium 3.5 - 5.1 mmol/L 4.0  4.1  4.9   Chloride 98 - 111 mmol/L 105  104  102   CO2 22 - 32 mmol/L 27  27  20    Calcium 8.9 - 10.3 mg/dL 9.5  51.8  9.6   Total Protein 6.5 - 8.1 g/dL 7.4  8.1    Total Bilirubin 0.3 - 1.2 mg/dL 0.4  0.4    Alkaline Phos 38 - 126 U/L 69  72    AST 15 - 41 U/L 30  40    ALT 0 - 44 U/L 39  49          RADIOGRAPHIC STUDIES: I have personally reviewed the radiological images as listed and agreed with the findings in the report.  No results found. Echocardiogram 12/06/2021:  Left ventricle cavity is normal in size. Abnormal septal wall motion due  to left bundle branch block. Moderate global hypokinesis. LVEF 35-40%.  Indeterminate diastolic filling pattern.  Left atrial cavity is normal in size.  Mild tricuspid regurgitation.  No  evidence of pulmonary hypertension.  Previous study on 10/04/2020 reported mod LA dilatation, grade 1 diastolic  dysfunction. No other significant change noted.  ASSESSMENT &  PLAN:   57 y.o. 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  Previous UPEP and skeletal survey negative. Patient has no evidence of anemia, renal failure on labs. No hypercalcemia. No new bone pain. SPEP shows stable M-spike at 1g/dl with no significant increase over the last couple of follow-ups. SFLC ratio within normal limits -12/21/18 MRI Cervical spine which did not reveal any overt indication/concern for bone tumors. Continue follow up with Orthopedics and neurology. 2) Systolic nonischemic CHF patient previously noted improvement in her EF from 33% to 45%. She subsequently noted her EF normalized to 55% as of 06/19/18 clinic visit.  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 4) Peripheral radiculopathy /neuropathy -Is currently followed by Venetia Maxon of Neurosurgery as well as Dr Everlena Cooper of Neurology who have begun to work this up further. -Most recent electromyography showed evidence of radiculopathy, mild in degree, and without evidence of sensorimotor polyneuropathy.   Cannot r/o small fiber neuropathy -  Evidence of this on epidermal nerve fiber density analysis.  PLAN:  -Discussed lab results from 04/30/2023 in detail with patient. CBC showed WBC of 3.7K, hemoglobin of 13.0, and platelets of 249K. -no new anemia -WBC borderline low at 3.7K with intermittent mild neutropenia at 1.3 K/uL. We discussed that this could be from B vitamin deficiencies and we would monitor this with appropriate B vitamin replacement.  -CMP normal with no changes in kidney function, hypercalcemia -her myeloma panel continues to show stable M protein at 0.8 g/dL down from 1.0 g/dL 6 months ago -no clinical sign or evidence of IgG  lambda MGUS progression at this time  -will plan to look at labs again in 6 months with a phone visit. If labs are stable at that time, we will plan to alternate visits with PCP -answered of patient's questions in detail   FOLLOW-UP: Phone visit with Dr Candise Che in 6 months Labs 2 weeks prior to phone visit  The total time spent in the appointment was *** minutes* .  All of the patient's questions were answered with apparent satisfaction. The patient knows to call the clinic with any problems, questions or concerns.   Wyvonnia Lora MD MS AAHIVMS [facility] Hematology/Oncology Physician [facility]  .*Total Encounter Time as defined by the Centers for Medicare and Medicaid Services includes, in addition to the face-to-face time of a patient visit (documented in the note above) non-face-to-face time: obtaining and reviewing outside history, ordering and reviewing medications, tests or procedures, care coordination (communications with other health care professionals or caregivers) and documentation in the medical record.    I,Mitra Faeizi,acting as a Neurosurgeon for Wyvonnia Lora, MD.,have documented all relevant documentation on the behalf of Wyvonnia Lora, MD,as directed by  Wyvonnia Lora, MD while in the presence of Wyvonnia Lora, MD.  ***

## 2023-05-07 ENCOUNTER — Other Ambulatory Visit: Payer: Medicare PPO

## 2023-05-07 ENCOUNTER — Telehealth: Payer: Self-pay | Admitting: Hematology

## 2023-05-07 ENCOUNTER — Ambulatory Visit (HOSPITAL_COMMUNITY): Payer: Medicare PPO | Attending: Internal Medicine

## 2023-05-07 DIAGNOSIS — I5022 Chronic systolic (congestive) heart failure: Secondary | ICD-10-CM

## 2023-05-07 LAB — ECHOCARDIOGRAM COMPLETE
Area-P 1/2: 2.99 cm2
S' Lateral: 4.5 cm

## 2023-05-07 MED ORDER — PERFLUTREN LIPID MICROSPHERE
1.0000 mL | INTRAVENOUS | Status: AC | PRN
  Administered 2023-05-07: 2 mL via INTRAVENOUS

## 2023-05-07 NOTE — Telephone Encounter (Signed)
Patient is aware of scheduled appointment times/dates

## 2023-05-14 ENCOUNTER — Ambulatory Visit: Payer: Self-pay | Admitting: Cardiology

## 2023-05-22 ENCOUNTER — Encounter: Payer: Self-pay | Admitting: Family Medicine

## 2023-05-22 NOTE — Progress Notes (Unsigned)
No chief complaint on file.   Lindsey Rodriguez is a 57 y.o. female who presents for annual physical exam, Medicare wellness visit and follow-up on chronic medical conditions.    H/o hypertension, chronic systolic heart failure, non-ischemic cardiomyopathy--under the care of Dr. Jacinto Halim, last seen in May by Dr. Melton Alar.  Doing well on Jardiance, Coreg (improved energy after dose lowered), and entresto.   Scheduled to see Dr. Jacinto Halim in 07/2023. She recently had echo repeated, earlier this month: IMPRESSIONS   1. Left ventricular ejection fraction, by estimation, is 35 to 40%. The  left ventricle has moderately decreased function. The left ventricle  demonstrates global hypokinesis. The left ventricular internal cavity size  was mildly dilated.   2. Right ventricular systolic function is normal. The right ventricular  size is normal.   3. The mitral valve is normal in structure. Mild mitral valve  regurgitation.   4. The aortic valve is tricuspid. Aortic valve regurgitation is not  visualized.   BP Readings from Last 3 Encounters:  04/13/23 (!) 140/98  01/09/23 (!) 142/88  11/24/22 (!) 126/92     Hyperlipidemia: This is managed by cardiologist. She was unable to tolerate Vytorin, lipitor and Crestor in the past (myalgias).  She is tolerating pravastatin and zetia.  When lipids checked at her physical 04/2022, she had been off of both medications. Lipid panel was ordered by the cardiologist in March, but was not done (?) Patient is requesting Cardio IQ testing today.  Lab Results  Component Value Date   CHOL 241 (H) 04/26/2022   HDL 39 (L) 04/26/2022   LDLCALC 158 (H) 04/26/2022   TRIG 237 (H) 04/26/2022   CHOLHDL 6.2 (H) 04/26/2022    She continues to eat red meat 3 times/week, uses ground [place] a lot. Some burgers. +butter. Uses coconut oil. 3-4 eggs/week.    Chronic back pain:  She had L2-3 lateral interbody (Left side approach), L2-3 Posterior Spinal Fusion in January  2022 through [facility] (Dr. Emogene Morgan). She had CT myelograms of thoracic and lumbar spine in June 2022.  Last seen at [facility] 01/2021. She no longer sees neurosurgeon.  She still has some pain down her left leg (the right side resolved), more of a weakness than true pain.   ***UPDATE She has neuropathy in both feet.  Ice and heat help. Advil helps if the pain is really bad. Recalls being on cymbalta in the past (but no details about if it helped, why changed). Didn't tolerate lyrica and gabapentin due to sedation.  She previously saw Dr. Corliss Skains for chondrocalcinosis, OA, DJD, and fibromyalgia.  She wasn't on medicatons from their office, no longer follows there. She saw Dr. Kathi Ludwig at [facility] in July (referred by Dr. Denyse Amass), due to inflammatory arthritis, positive HLA-B27, and rt knee and rt foot pain. Diagnosed with Ankylosing spondylitis. Xrays of thoracic, and L-S spine and SI joints were ordered. (No results).   She sees Dr. Everlena Cooper for her headaches, last seen in 10/2022. She has migraines without aura, occipital neuralgia/cervicogenic headaches, and tension-type HA's. Also has idiopathic small fiber neuropathy.   He has suggested referring to pain management for treatment of neuralgia/neuropathy (having not tolerated or failed meds he typically prescribes), whenever she is ready for treatment.  He offered PT for if her balance worsens. She is using Tizanidine prn for her headaches, once every 1-2 weeks ***UPDATE.  She gets migraines about once a month. Ice, tizanidine and sleep resolve her headaches.   She has f/u scheduled  10/2023.    She continues to see Dr. Evelene Croon for depression and anxiety. Currently is on 80mg  of prozac and Wellbutrin XL 150mg  daily.  She has lorazepam for prn use. This is working okay for her. She reports still trying to find a therapist that clicks. 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  In his note, he mentioned that she stated she  was told she had B12 deficiency, mention of possible pernicious anemia, per PCP.  B12 levels in our system have been normal (see below). I'm guessing these labs were abnormal through [facility], NOT from me, her PCP. He has put in future orders for B12, intrinsic factor and anti-parietal Ab to be done prior to her next visit with Dr. Candise Che.  Lab Results  Component Value Date   VITAMINB12 577 07/06/2017  Level was 530 in 07/2020, done through [facility].   She is getting testosterone pellets from [facility], which has helped her libido. She denies any vaginal irritation or discharge.  Coconut oil helps with dryness.  Cholelithiasis was noted on 03/2021 US done for flank pain.  She had normal CBD, poss hepatic steatosis.  Bilateral nephrolithiasis was noted on CT scan.   She denies any flank pain, hematuria, RUQ pain.   Immunization History  Administered Date(s) Administered   Hepatitis B 10/31/2008   Influenza Split 03/18/2011, 04/02/2014, 05/01/2014   Influenza Whole 07/03/2009   Influenza, Seasonal, Injecte, Preservative Fre 06/04/2012   Influenza,inj,Quad PF,6+ Mos 03/10/2013, 04/07/2015, 03/02/2016, 03/08/2017, 03/13/2018, 04/07/2019, 04/05/2020, 04/14/2021   Influenza-Unspecified 05/03/2012, 04/07/2015, 04/21/2020   MMR 07/03/1981   Pneumococcal Conjugate-13 03/08/2017   Tdap 07/03/1981, 09/05/2012   She refuses COVID vaccines.  She previously reported that a family member died from the vaccine, and daughter has heart failure (had vaccines, and COVID) Had COVID 06/2021 Last Pap smear: per Dr. Henderson Cloud, approx 10/2014; s/p hysterectomy (for benign reasons) Last mammogram: 07/2022 Last colonoscopy: 12/2017, normal (previously had polyp, in 2014); 5 yr f/u rec per Dr. Loreta Ave. She saw Dr. Loreta Ave in June 2024, recommended colonoscopy, pt declined, wanting to wait for 10 year f/u (per Dr. Kenna Gilbert note). Last DEXA: 06/2019, T-1.4 at L wrist Dentist: twice yearly Ophtho: yearly Exercise:    Tries to walk  at least 20 minutes between 1-5 times/week. Not getting any weight-bearing exercise.  Vitamin D last checked in 07/2020, normal at 40. She reports she was taking Mg-Ca-D at that time. She is taking similar supplement now.   Patient Care Team: Joselyn Arrow, MD as PCP - General (Family Medicine) Drema Dallas, DO as Consulting Physician (Neurology) Charna Elizabeth, MD as Consulting Physician (Gastroenterology) Yates Decamp, MD as Consulting Physician (Cardiology) Johney Maine, MD as Consulting Physician (Hematology) Patricia Nettle, MD (Orthopedic Surgery) Letta Kocher, MD (Rehabilitation) Maeola Harman, MD as Consulting Physician (Neurosurgery) Karenann Cai, MD as Consulting Physician (Neurosurgery) Lavada Mesi, MD (Family Medicine) Kathryne Hitch, MD as Consulting Physician (Orthopedic Surgery) Elmon Else, MD as Consulting Physician (Dermatology) Puschinsky, Adelfa Koh., MD (General Surgery) Milagros Evener, MD as Consulting Physician (Psychiatry) Thurnell Garbe, OD (Optometry) Adele Dan, DMD (Dentistry) [facility]--Dr. Kathi Ludwig  Depression Screening:    04/26/2022    9:03 AM 04/14/2021    1:37 PM 04/05/2020    8:36 AM 03/19/2019    9:26 AM 03/13/2018    8:52 AM  Depression screen PHQ 2/9  Decreased Interest 1 0 0 0 0  Down, Depressed, Hopeless 1 0 0 0 0  PHQ -  2 Score 2 0 0 0 0  Altered sleeping 1      Tired, decreased energy 3      Change in appetite 2      Feeling bad or failure about yourself  2      Trouble concentrating 2      Moving slowly or fidgety/restless 0      Suicidal thoughts 0      PHQ-9 Score 12      Difficult doing work/chores Somewhat difficult         Falls screen:     10/27/2022    9:41 AM 04/26/2022    8:58 AM 02/15/2022   11:21 AM 10/25/2021   10:17 AM 09/07/2021   11:41 AM  Fall Risk   Falls in the past year? 1 0 0 0 0  Number falls in past yr: 1 0 0 0 0  Injury with Fall? 0 0 0 0 0  Risk for fall due to :  No Fall  Risks No Fall Risks  No Fall Risks  Follow up Falls evaluation completed Falls evaluation completed Falls evaluation completed  Falls evaluation completed    Falls screen:     10/27/2022    9:41 AM 04/26/2022    8:58 AM 02/15/2022   11:21 AM 10/25/2021   10:17 AM 09/07/2021   11:41 AM  Fall Risk   Falls in the past year? 1 0 0 0 0  Number falls in past yr: 1 0 0 0 0  Injury with Fall? 0 0 0 0 0  Risk for fall due to :  No Fall Risks No Fall Risks  No Fall Risks  Follow up Falls evaluation completed Falls evaluation completed Falls evaluation completed  Falls evaluation completed     Functional Status Survey:        End of Life Discussion:  Patient has a living will and medical power of attorney  PMH, PSH, SH and FH were reviewed and updated.    ROS:  The patient denies anorexia, fever, vision changes, decreased hearing, ear pain, sore throat, breast concerns, chest pain, palpitations, syncope (none since 08/2016), cough, swelling, nausea, vomiting, diarrhea, constipation, abdominal pain, melena, hematochezia, indigestion/heartburn, hematuria, vaginal bleeding, discharge, odor or itch, genital lesions, tremor, suspicious skin lesions, depression, abnormal bleeding/bruising, or enlarged lymph nodes. Sees dermatologist regularly Some vaginal dryness. Libido improved with testosterone. Denies hot flashes or night sweats. Neuropathy in feet--cold, tingly Headaches per HPI managed with tizanidine), back pain per HPI. Some neck pain--occasionally takes an advil and it helps, along with icing. Depression/anxiety--under the care of psych. No longer having dizziness, chest pain or shortness of breath. L medial elbow pain???   PHYSICAL EXAM:  LMP 12/16/2012   Wt Readings from Last 3 Encounters:  04/13/23 190 lb (86.2 kg)  01/09/23 204 lb (92.5 kg)  11/24/22 205 lb 9.6 oz (93.3 kg)   General Appearance:    Alert, cooperative, no distress, appears stated age.  Head:    Normocephalic,  without obvious abnormality, atraumatic, nontender  Eyes:    PERRL, conjunctiva/corneas clear, EOM's intact, fundi benign  Ears:    Normal TM's and external ear canals  Nose:   No drainage, no sinus tenderness  Throat:   Normal mucosa  Neck:   Supple, no lymphadenopathy;  thyroid:  no enlargement/ tenderness/nodules; no carotid bruit or JVD. WHSS anteriorly, horizontal, and vertical posterior WHSS.   Back:   No spinal tenderness or spasm, no trigger points in upper back.  Tender at paraspinous muscles in L lower thoracic area area, no spasm. Mild bilateral SI tenderness ***  Lungs:     Clear to auscultation bilaterally without wheezes, rales or ronchi; respirations unlabored  Chest Wall:    No tenderness or deformity   Heart:    regular rate and rhythm, no murmur, rub or gallop ***MURMUR??  Breast Exam:    no nipple inversion, discharge, skin dimpling, breast masses or axillary lymphadenopathy.  Abdomen:     Soft, non-tender, nondistended, normoactive bowel sounds, no masses, no hepatosplenomegaly  Genitalia:    normal external genitalia without lesions.  Normal bimanual exam.  Uterus surgically absent. No adnexal masses or tenderness  Rectal:   Normal sphincter tone, no mass. Heme negative stool  Extremities:   No clubbing, cyanosis or edema.Tender at L medial epicondyle.   Pulses:   2+ and symmetric all extremities  Skin:   Skin color, texture, turgor normal, lesions. No rashes.   Lymph nodes:   Cervical, supraclavicular, inguinal and axillary nodes normal  Neurologic:   Normal strength, sensation and gait; reflexes 1+ and symmetric              Psych:   Normal mood, affect, hygiene and grooming.    ***UPDATE BACK/NECK PAIN Tender at L medial epicondyle? Heart murmur?  ASSESSMENT/PLAN:  Why was pravastatin and zetia d/c'd???  Looks like it was rx'd x 90d a few weeks ago. WHEN DID SHE STOP TAKING MEDS??? Her cardiologist ordered lipids over 6 months ago, doesn't look like it was ever  done. MyChart message asked for CardioIQ to be done.  I looked up the order--these are only done through [facility].  She DOES need mini-cog and functional status  Flu shot; offer/decline COVID TdaP from pharmacy (if she hasn't gotten yet) Shingrix from pharmacy  Discussed monthly self breast exams and yearly mammograms; at least 30 minutes of aerobic activity at least 5 days/week, weight-bearing exercise at least 2x/wk; proper sunscreen use reviewed; healthy diet, including goals of calcium and vitamin D intake and alcohol recommendations (less than or equal to 1 drink/day) reviewed; regular seatbelt use; changing batteries in smoke detectors.   Immunization recommendations discussed--continue yearly flu shots  TdaP due, to get from pharmacy. Shingrix recommended needs to get from pharmacy [facility]).  COVID recommended, declined by pt.  Prevnar-20 age 10.  Colonoscopy recommendations reviewed, UTD, due again 01/2023. Patient saw Dr. Loreta Ave 12/2022, declined the recommended 5 yr f/u colonoscopy, wanting to wait for 10 years.   Patient is full code, full care    F/u 1 year, sooner prn.    Medicare Attestation I have personally reviewed: The patient's medical and social history Their use of alcohol, tobacco or illicit drugs Their current medications and supplements The patient's functional ability including ADLs,fall risks, home safety risks, cognitive, and hearing and visual impairment Diet and physical activities Evidence for depression or mood disorders  The patient's weight, height, BMI have been recorded in the chart.  I have made referrals, counseling, and provided education to the patient based on review of the above and I have provided the patient with a written personalized care plan for preventive services.

## 2023-05-22 NOTE — Patient Instructions (Incomplete)
HEALTH MAINTENANCE RECOMMENDATIONS:  It is recommended that you get at least 30 minutes of aerobic exercise at least 5 days/week (for weight loss, you may need as much as 60-90 minutes). This can be any activity that gets your heart rate up. This can be divided in 10-15 minute intervals if needed, but try and build up your endurance at least once a week.  Weight bearing exercise is also recommended twice weekly.  Eat a healthy diet with lots of vegetables, fruits and fiber.  "Colorful" foods have a lot of vitamins (ie green vegetables, tomatoes, red peppers, etc).  Limit sweet tea, regular sodas and alcoholic beverages, all of which has a lot of calories and sugar.  Up to 1 alcoholic drink daily may be beneficial for women (unless trying to lose weight, watch sugars).  Drink a lot of water.  Calcium recommendations are 1200-1500 mg daily (1500 mg for postmenopausal women or women without ovaries), and vitamin D 1000 IU daily.  This should be obtained from diet and/or supplements (vitamins), and calcium should not be taken all at once, but in divided doses.  Monthly self breast exams and yearly mammograms for women over the age of 73 is recommended.  Sunscreen of at least SPF 30 should be used on all sun-exposed parts of the skin when outside between the hours of 10 am and 4 pm (not just when at beach or pool, but even with exercise, golf, tennis, and yard work!)  Use a sunscreen that says "broad spectrum" so it covers both UVA and UVB rays, and make sure to reapply every 1-2 hours.  Remember to change the batteries in your smoke detectors when changing your clock times in the spring and fall. Carbon monoxide detectors are recommended for your home.  Use your seat belt every time you are in a car, and please drive safely and not be distracted with cell phones and texting while driving.   Lindsey Rodriguez , Thank you for taking time to come for your Medicare Wellness Visit. I appreciate your ongoing  commitment to your health goals. Please review the following plan we discussed and let me know if I can assist you in the future.   This is a list of the screening recommended for you and due dates:  Health Maintenance  Topic Date Due   COVID-19 Vaccine (1) Never done   Zoster (Shingles) Vaccine (1 of 2) Never done   DTaP/Tdap/Td vaccine (3 - Td or Tdap) 09/06/2022   Colon Cancer Screening  01/22/2023   Flu Shot  10/01/2023*   Mammogram  08/02/2023   Medicare Annual Wellness Visit  05/22/2024   Hepatitis C Screening  Completed   HIV Screening  Completed   HPV Vaccine  Aged Out  *Topic was postponed. The date shown is not the original due date.   Please get tetanus booster (TdaP) from the pharmacy.  I recommend getting the new shingles vaccine (Shingrix). Since you have Medicare, you will need to get this from the pharmacy, as it is covered by Part D. This is a series of 2 injections, spaced 2 months apart.   This should be separated from other vaccines by at least 2 weeks.  Flu shots are encouraged yearly. Consider a non-mRNA vaccine for COVID (Novavax)--you'd have to check to see which pharmacies have this.  Restart pravastatin and ezetimide. Ask Dr. Jacinto Halim to recheck your lipids once back on medications (and you can discuss which lipid profile to do).  Switch to olive oil  for cooking.  Try and cut back on red meats, eating more poultry, fish, legumes or other plant-based proteins. Try and cut back on eggs--eating more egg whites.  We discussed the fact that your colonoscopy was recommended to be repeated this year.  I do not recommend waiting for 10 years, due to your history of adenomatous polyps.

## 2023-05-23 ENCOUNTER — Ambulatory Visit: Payer: Medicare PPO | Admitting: Family Medicine

## 2023-05-23 ENCOUNTER — Encounter: Payer: Self-pay | Admitting: Family Medicine

## 2023-05-23 VITALS — BP 132/80 | HR 68 | Ht 66.0 in | Wt 201.2 lb

## 2023-05-23 DIAGNOSIS — E6609 Other obesity due to excess calories: Secondary | ICD-10-CM

## 2023-05-23 DIAGNOSIS — D472 Monoclonal gammopathy: Secondary | ICD-10-CM | POA: Diagnosis not present

## 2023-05-23 DIAGNOSIS — E559 Vitamin D deficiency, unspecified: Secondary | ICD-10-CM

## 2023-05-23 DIAGNOSIS — I10 Essential (primary) hypertension: Secondary | ICD-10-CM

## 2023-05-23 DIAGNOSIS — E782 Mixed hyperlipidemia: Secondary | ICD-10-CM | POA: Diagnosis not present

## 2023-05-23 DIAGNOSIS — I428 Other cardiomyopathies: Secondary | ICD-10-CM | POA: Diagnosis not present

## 2023-05-23 DIAGNOSIS — M549 Dorsalgia, unspecified: Secondary | ICD-10-CM

## 2023-05-23 DIAGNOSIS — M797 Fibromyalgia: Secondary | ICD-10-CM

## 2023-05-23 DIAGNOSIS — Z860101 Personal history of adenomatous and serrated colon polyps: Secondary | ICD-10-CM

## 2023-05-23 DIAGNOSIS — M85839 Other specified disorders of bone density and structure, unspecified forearm: Secondary | ICD-10-CM

## 2023-05-23 DIAGNOSIS — G629 Polyneuropathy, unspecified: Secondary | ICD-10-CM

## 2023-05-23 DIAGNOSIS — Z23 Encounter for immunization: Secondary | ICD-10-CM

## 2023-05-23 DIAGNOSIS — M7702 Medial epicondylitis, left elbow: Secondary | ICD-10-CM

## 2023-05-23 DIAGNOSIS — M459 Ankylosing spondylitis of unspecified sites in spine: Secondary | ICD-10-CM

## 2023-05-23 DIAGNOSIS — Z0000 Encounter for general adult medical examination without abnormal findings: Secondary | ICD-10-CM

## 2023-05-23 DIAGNOSIS — R7301 Impaired fasting glucose: Secondary | ICD-10-CM

## 2023-05-23 DIAGNOSIS — F419 Anxiety disorder, unspecified: Secondary | ICD-10-CM

## 2023-05-23 DIAGNOSIS — G8929 Other chronic pain: Secondary | ICD-10-CM

## 2023-05-23 DIAGNOSIS — G43709 Chronic migraine without aura, not intractable, without status migrainosus: Secondary | ICD-10-CM

## 2023-05-23 DIAGNOSIS — F32A Depression, unspecified: Secondary | ICD-10-CM

## 2023-05-23 DIAGNOSIS — M47812 Spondylosis without myelopathy or radiculopathy, cervical region: Secondary | ICD-10-CM

## 2023-05-24 ENCOUNTER — Encounter: Payer: Self-pay | Admitting: *Deleted

## 2023-05-24 LAB — GLUCOSE, RANDOM: Glucose: 99 mg/dL (ref 70–99)

## 2023-05-24 LAB — HEMOGLOBIN A1C
Est. average glucose Bld gHb Est-mCnc: 126 mg/dL
Hgb A1c MFr Bld: 6 % — ABNORMAL HIGH (ref 4.8–5.6)

## 2023-05-24 LAB — VITAMIN D 25 HYDROXY (VIT D DEFICIENCY, FRACTURES): Vit D, 25-Hydroxy: 23.8 ng/mL — ABNORMAL LOW (ref 30.0–100.0)

## 2023-05-24 MED ORDER — VITAMIN D (ERGOCALCIFEROL) 1.25 MG (50000 UNIT) PO CAPS: 50000.0000 [IU] | ORAL_CAPSULE | ORAL | 0 refills | Status: DC

## 2023-07-06 ENCOUNTER — Other Ambulatory Visit: Payer: Self-pay | Admitting: Family Medicine

## 2023-07-06 DIAGNOSIS — Z1231 Encounter for screening mammogram for malignant neoplasm of breast: Secondary | ICD-10-CM

## 2023-07-25 ENCOUNTER — Encounter: Payer: Self-pay | Admitting: Neurology

## 2023-07-29 ENCOUNTER — Encounter: Payer: Self-pay | Admitting: Cardiology

## 2023-07-29 DIAGNOSIS — E7800 Pure hypercholesterolemia, unspecified: Secondary | ICD-10-CM

## 2023-07-29 NOTE — Progress Notes (Unsigned)
Cardiology Office Note:  .   Date:  07/31/2023  ID:  [name], DOB 22-Oct-1965, MRN [id number] PCP: Joselyn Arrow, MD  [facility] Providers Cardiologist:  Yates Decamp, MD   History of Present Illness: Lindsey Rodriguez   Lindsey Rodriguez is a 58 y.o. Caucasian female, with frequent PVCs and  new onset left bundle branch block and non ischemic cardiomyopathy diagnosed in 2017 with moderate LV systolic dysfunction, EF 35-40% and has remained stable since 2021.  She has hypertension and hyperlipidemia.  She has also been diagnosed with IgG lambda MGUS, small fiber neuropathy,  and severe degenerative L5S1 spine disease. She underwent L4-S1 fusion in January 2022.    Discussed the use of AI scribe software for clinical note transcription with the patient, who gave verbal consent to proceed.  History of Present Illness   The patient, with a history of hyperlipidemia and heart failure, presents with persistent nausea attributed to her statin therapy. She reports that she discontinued ezetimibe and pravastatin due to the constant nausea, which resolved promptly after cessation of the medications. She has previously tried multiple statins, including Vytorin (simvastatin and azithromycin), pravastatin, and azithromycin, all of which have resulted in similar symptoms. The patient is willing to try a different statin, Crestor, at a lower dose to manage her cholesterol levels.  In addition to her hyperlipidemia, the patient's heart failure is well-managed on her current regimen of Entresto and carvedilol. She reports that her blood pressure readings at home are typically in the 130s over 80s. She is also employed, working approximately 25 hours per week, and teaches EMT classes at a [employer]. However, she admits that her exercise regimen is not optimal.       Labs   Lab Results  Component Value Date   CHOL 241 (H) 04/26/2022   HDL 39 (L) 04/26/2022   LDLCALC 158 (H) 04/26/2022   TRIG 237  (H) 04/26/2022   CHOLHDL 6.2 (H) 04/26/2022   Lab Results  Component Value Date   NA 137 04/30/2023   K 4.0 04/30/2023   CO2 27 04/30/2023   GLUCOSE 99 05/23/2023   BUN 11 04/30/2023   CREATININE 0.85 04/30/2023   CALCIUM 9.5 04/30/2023   EGFR 79 07/11/2022   GFRNONAA >60 04/30/2023      Latest Ref Rng & Units 05/23/2023    9:52 AM 04/30/2023    8:01 AM 10/23/2022    9:35 AM  BMP  Glucose 70 - 99 mg/dL 99  782  91   BUN 6 - 20 mg/dL  11  12   Creatinine 9.56 - 1.00 mg/dL  2.13  0.86   Sodium 578 - 145 mmol/L  137  138   Potassium 3.5 - 5.1 mmol/L  4.0  4.1   Chloride 98 - 111 mmol/L  105  104   CO2 22 - 32 mmol/L  27  27   Calcium 8.9 - 10.3 mg/dL  9.5  46.9       Latest Ref Rng & Units 04/30/2023    8:01 AM 10/23/2022    9:35 AM 04/06/2022    9:35 AM  CBC  WBC 4.0 - 10.5 K/uL 3.7  4.3  4.1   Hemoglobin 12.0 - 15.0 g/dL 62.9  52.8  41.3   Hematocrit 36.0 - 46.0 % 37.2  39.8  38.5   Platelets 150 - 400 K/uL 249  226  202    External Labs:   Review of Systems  Cardiovascular:  Negative for chest pain, dyspnea on exertion and leg swelling.    Physical Exam:   VS:  BP 138/82 (BP Location: Left Arm, Patient Position: Sitting, Cuff Size: Normal)   Pulse 72   Resp 16   Ht 5\' 6"  (1.676 m)   Wt 198 lb 6.4 oz (90 kg)   LMP 12/16/2012   SpO2 94%   BMI 32.02 kg/m    Wt Readings from Last 3 Encounters:  07/31/23 198 lb 6.4 oz (90 kg)  05/23/23 201 lb 3.2 oz (91.3 kg)  04/13/23 190 lb (86.2 kg)     Physical Exam Neck:     Vascular: No carotid bruit or JVD.  Cardiovascular:     Rate and Rhythm: Normal rate and regular rhythm.     Pulses: Intact distal pulses.     Heart sounds: No murmur heard.    No gallop.  Pulmonary:     Effort: Pulmonary effort is normal.     Breath sounds: Normal breath sounds.  Abdominal:     General: Bowel sounds are normal.     Palpations: Abdomen is soft.  Musculoskeletal:     Right lower leg: No edema.     Left lower leg: No  edema.     Studies Reviewed: .    Echocardiogram 05/07/2023:  1. Left ventricular ejection fraction, by estimation, is 35 to 40%. The left ventricle has moderately decreased function. The left ventricle demonstrates global hypokinesis. The left ventricular internal cavity size was mildly dilated.  2. Right ventricular systolic function is normal. The right ventricular size is normal.  3. The mitral valve is normal in structure. Mild mitral valve regurgitation.  4. The aortic valve is tricuspid. Aortic valve regurgitation is not visualized.  EKG:    EKG Interpretation Date/Time:  Tuesday July 31 2023 08:11:34 EST Ventricular Rate:  65 PR Interval:  144 QRS Duration:  134 QT Interval:  476 QTC Calculation: 495 R Axis:   -46  Text Interpretation: EKG 07/31/2023: Normal sinus rhythm at rate of 65 bpm, left bundle branch block.  Compared to 06/23/2016, LBBB is new.  No change from 12/06/2021. Confirmed by Delrae Rend [phone]) on 07/31/2023 8:20:07 AM    12/06/2021: normal sinus rhythm at a rate up 67 bpm. Left axis. LBBB, no further analysis.   Medications and allergies    Allergies  Allergen Reactions   Penicillins Shortness Of Breath and Rash    Has patient had a PCN reaction causing immediate rash, facial/tongue/throat swelling, SOB or lightheadedness with hypotension: no Has patient had a PCN reaction causing severe rash involving mucus membranes or skin necrosis: no Has patient had a PCN reaction that required hospitalization no Has patient had a PCN reaction occurring within the last 10 years: no If all of the above answers are "NO", then may proceed with Cephalosporin use.  Other reaction(s): Unknown   Codeine Other (See Comments)    Manic depressive Other reaction(s): Unknown   Voltaren [Diclofenac] Nausea And Vomiting and Other (See Comments)    Pt stated "tore stomach up"   Zetia [Ezetimibe] Nausea Only   Crestor [Rosuvastatin] Other (See Comments)    Myalgia    Diclofenac Sodium    Pravastatin Nausea Only   Vytorin [Ezetimibe-Simvastatin] Other (See Comments)    Severe myalgia   Erythromycin Rash    Other reaction(s): Unknown     Current Outpatient Medications:    buPROPion (WELLBUTRIN XL) 150 MG 24 hr tablet, Take by mouth., Disp: , Rfl:  carvedilol (COREG) 3.125 MG tablet, Take 1 tablet (3.125 mg total) by mouth 2 (two) times daily with a meal., Disp: 60 tablet, Rfl: 10   EPINEPHrine 0.3 mg/0.3 mL IJ SOAJ injection, Inject 0.3 mg into the muscle as needed for anaphylaxis., Disp: 1 each, Rfl: 0   FLUoxetine (PROZAC) 40 MG capsule, Take 2 capsules by mouth daily., Disp: , Rfl:    JARDIANCE 10 MG TABS tablet, TAKE 1 TABLET BY MOUTH DAILY BEFORE BREAKFAST, Disp: 30 tablet, Rfl: 6   loratadine (CLARITIN) 10 MG tablet, Take 10 mg by mouth daily., Disp: , Rfl:    LORazepam (ATIVAN) 0.5 MG tablet, Take 1-2 tablets (0.5-1 mg total) by mouth 3 (three) times daily as needed for anxiety. Prn anxiety, Disp: 30 tablet, Rfl: 0   NON FORMULARY, Take 1 tablet by mouth daily. Magnesium - Calcium - Potassium - Vitamin D, Disp: , Rfl:    rosuvastatin (CRESTOR) 10 MG tablet, Take 1 tablet (10 mg total) by mouth daily., Disp: 30 tablet, Rfl: 3   sacubitril-valsartan (ENTRESTO) 49-51 MG, Take 1 tablet by mouth 2 (two) times daily., Disp: 180 tablet, Rfl: 3   tiZANidine (ZANAFLEX) 2 MG tablet, Take 1 tablet (2 mg total) by mouth every 6 (six) hours as needed for muscle spasms., Disp: 30 tablet, Rfl: 11   Vitamin D, Ergocalciferol, (DRISDOL) 1.25 MG (50000 UNIT) CAPS capsule, Take 1 capsule (50,000 Units total) by mouth every 7 (seven) days., Disp: 12 capsule, Rfl: 0   ASSESSMENT AND PLAN: .      ICD-10-CM   1. Nonischemic cardiomyopathy (HCC)  I42.8 EKG 12-Lead    sacubitril-valsartan (ENTRESTO) 49-51 MG    Basic Metabolic Panel (BMET)    ECHOCARDIOGRAM COMPLETE    2. Chronic systolic heart failure (HCC)  Q46.96 sacubitril-valsartan (ENTRESTO) 49-51 MG    Basic  Metabolic Panel (BMET)    ECHOCARDIOGRAM COMPLETE    CT CARDIAC SCORING (SELF PAY ONLY)    3. LBBB (left bundle branch block)  I44.7 Basic Metabolic Panel (BMET)    4. Essential hypertension, benign  I10 Basic Metabolic Panel (BMET)    5. Hypercholesteremia  E78.00 rosuvastatin (CRESTOR) 10 MG tablet    CT CARDIAC SCORING (SELF PAY ONLY)      1. Nonischemic cardiomyopathy [facility]) Patient with nonischemic cardiomyopathy by a negative nuclear stress test in the past, she remains asymptomatic.  No clinical evidence of heart failure.  2. Chronic heart failure with mildly reduced ejection fraction (HFmrEF, 41-49%) (HCC) Presently on Entresto at low-dose, previously was hypotensive hence could not titrate the medications, blood pressure is now well-controlled and hence we will go ahead and increase her Entresto to 49/51 mg dose.  Will obtain BMP in 6 weeks.  She is unable to tolerate carvedilol >3.125 mg dose due to marked fatigue.  Hence continue low-dose.  Heart rate is <70.  Repeat echocardiogram prior to next office visit in a year.  3. LBBB (left bundle branch block) This is chronic.  Suspect her nonischemic cardiomyopathy may be related to underlying LBBB.  4. Essential hypertension, benign Blood pressure is well-controlled on carvedilol and Entresto.  Renal function has remained stable. Labs reviewed.  With regard to hypercholesterolemia, she has not been able to tolerate statins with issues of myalgias.  I will consider switching her to Repatha if needed, will obtain coronary calcium score for further cardiac risk stratification which will guide me in therapy.  For now I will start her back on Crestor 10 mg daily, previously she  was on 40 mg which she did not tolerate she is unable to tolerate Zetia due to nausea as well unless abnormal I will see her back in 1 year and will continue to manage her lipids remotely.   Signed,  Yates Decamp, MD, [facility] 07/31/2023, 11:26 AM [facility] [address] [place], [place] [address] Phone: [phone]. Fax:  [phone]

## 2023-07-30 NOTE — Telephone Encounter (Signed)
ICD-10-CM   1. Hypercholesteremia  E78.00 Lipid Panel With LDL/HDL Ratio

## 2023-07-31 ENCOUNTER — Ambulatory Visit: Payer: Medicare PPO | Attending: Cardiology | Admitting: Cardiology

## 2023-07-31 ENCOUNTER — Encounter: Payer: Self-pay | Admitting: Cardiology

## 2023-07-31 VITALS — BP 138/82 | HR 72 | Resp 16 | Ht 66.0 in | Wt 198.4 lb

## 2023-07-31 DIAGNOSIS — I10 Essential (primary) hypertension: Secondary | ICD-10-CM | POA: Diagnosis not present

## 2023-07-31 DIAGNOSIS — I5022 Chronic systolic (congestive) heart failure: Secondary | ICD-10-CM | POA: Diagnosis not present

## 2023-07-31 DIAGNOSIS — I428 Other cardiomyopathies: Secondary | ICD-10-CM | POA: Diagnosis not present

## 2023-07-31 DIAGNOSIS — I447 Left bundle-branch block, unspecified: Secondary | ICD-10-CM | POA: Diagnosis not present

## 2023-07-31 DIAGNOSIS — E7800 Pure hypercholesterolemia, unspecified: Secondary | ICD-10-CM

## 2023-07-31 MED ORDER — ENTRESTO 49-51 MG PO TABS: 1.0000 | ORAL_TABLET | Freq: Two times a day (BID) | ORAL | 3 refills | Status: AC

## 2023-07-31 MED ORDER — ROSUVASTATIN CALCIUM 10 MG PO TABS: 10.0000 mg | ORAL_TABLET | Freq: Every day | ORAL | 3 refills | Status: DC

## 2023-07-31 NOTE — Addendum Note (Signed)
Addended by: Delrae Rend on: 07/31/2023 11:26 AM   Modules accepted: Orders

## 2023-07-31 NOTE — Patient Instructions (Signed)
Medication Instructions:  Stop  Pravastatin Stop Zetia Start Rosuvastatin 10 mg by mouth daily Increase Entresto to 49/51 mg by mouth twice daily *If you need a refill on your cardiac medications before your next appointment, please call your pharmacy*   Lab Work: Have fasting lab work done in 6 weeks.  Lipids and BMP.  This can be done at any [facility].  There is an office on the first floor of our building If you have labs (blood work) drawn today and your tests are completely normal, you will receive your results only by: MyChart Message (if you have MyChart) OR A paper copy in the mail If you have any lab test that is abnormal or we need to change your treatment, we will call you to review the results.   Testing/Procedures: Your physician has requested that you have an echocardiogram in one year. Echocardiography is a painless test that uses sound waves to create images of your heart. It provides your doctor with information about the size and shape of your heart and how well your heart's chambers and valves are working. This procedure takes approximately one hour. There are no restrictions for this procedure. Please do NOT wear cologne, perfume, aftershave, or lotions (deodorant is allowed). Please arrive 15 minutes prior to your appointment time.  Please note: We ask at that you not bring children with you during ultrasound (echo/ vascular) testing. Due to room size and safety concerns, children are not allowed in the ultrasound rooms during exams. Our front office staff cannot provide observation of children in our lobby area while testing is being conducted. An adult accompanying a patient to their appointment will only be allowed in the ultrasound room at the discretion of the ultrasound technician under special circumstances. We apologize for any inconvenience.    Follow-Up: At [facility], you and your health needs are our priority.  As part of our continuing mission to  provide you with exceptional heart care, we have created designated Provider Care Teams.  These Care Teams include your primary Cardiologist (physician) and Advanced Practice Providers (APPs -  Physician Assistants and Nurse Practitioners) who all work together to provide you with the care you need, when you need it.  We recommend signing up for the patient portal called "MyChart".  Sign up information is provided on this After Visit Summary.  MyChart is used to connect with patients for Virtual Visits (Telemedicine).  Patients are able to view lab/test results, encounter notes, upcoming appointments, etc.  Non-urgent messages can be sent to your provider as well.   To learn more about what you can do with MyChart, go to [email or web address].    Your next appointment:   12 month(s) after echo  Provider:   Yates Decamp, MD     Other Instructions

## 2023-08-10 ENCOUNTER — Ambulatory Visit (HOSPITAL_COMMUNITY)
Admission: RE | Admit: 2023-08-10 | Discharge: 2023-08-10 | Disposition: A | Discharge status: Home / Self Care | Payer: Self-pay | Source: Ambulatory Visit | Attending: Cardiology | Admitting: Cardiology

## 2023-08-10 DIAGNOSIS — E7800 Pure hypercholesterolemia, unspecified: Secondary | ICD-10-CM | POA: Insufficient documentation

## 2023-08-10 DIAGNOSIS — Z1231 Encounter for screening mammogram for malignant neoplasm of breast: Secondary | ICD-10-CM

## 2023-08-10 DIAGNOSIS — I5022 Chronic systolic (congestive) heart failure: Secondary | ICD-10-CM | POA: Insufficient documentation

## 2023-08-12 ENCOUNTER — Encounter: Payer: Self-pay | Admitting: Cardiology

## 2023-08-12 NOTE — Progress Notes (Signed)
 Coronary calcium  score is 0. So as long as she is able to take Crestor  10, I am fine with this.

## 2023-08-13 ENCOUNTER — Encounter (HOSPITAL_BASED_OUTPATIENT_CLINIC_OR_DEPARTMENT_OTHER): Payer: Medicare PPO | Admitting: Radiology

## 2023-08-18 ENCOUNTER — Encounter: Payer: Self-pay | Admitting: Cardiology

## 2023-08-23 NOTE — Telephone Encounter (Signed)
Patient has not read my chart message.  Per DPR it is OK to leave a detailed message.  Left message that Dr Jacinto Halim had responded in my chart and to call office or send message if any questions.

## 2023-08-24 ENCOUNTER — Ambulatory Visit
Admission: RE | Admit: 2023-08-24 | Discharge: 2023-08-24 | Disposition: A | Discharge status: Home / Self Care | Payer: Medicare Other | Source: Ambulatory Visit | Attending: Family Medicine | Admitting: Family Medicine

## 2023-08-24 DIAGNOSIS — Z1231 Encounter for screening mammogram for malignant neoplasm of breast: Secondary | ICD-10-CM

## 2023-08-29 ENCOUNTER — Other Ambulatory Visit (HOSPITAL_COMMUNITY): Payer: Self-pay

## 2023-08-29 ENCOUNTER — Telehealth: Payer: Self-pay | Admitting: Pharmacy Technician

## 2023-08-29 ENCOUNTER — Other Ambulatory Visit: Payer: Self-pay | Admitting: Internal Medicine

## 2023-08-29 ENCOUNTER — Other Ambulatory Visit: Payer: Self-pay | Admitting: Family Medicine

## 2023-08-29 ENCOUNTER — Other Ambulatory Visit: Payer: Self-pay | Admitting: Cardiology

## 2023-08-29 DIAGNOSIS — R928 Other abnormal and inconclusive findings on diagnostic imaging of breast: Secondary | ICD-10-CM

## 2023-08-29 NOTE — Telephone Encounter (Signed)
 Pharmacy Patient Advocate Encounter   Received notification from CoverMyMeds that prior authorization for Entresto is required/requested.   Insurance verification completed.   The patient is insured through [facility] ADVANTAGE/RX ADVANCE .   Per test claim: PA required; PA submitted to above mentioned insurance via CoverMyMeds Key/confirmation #/EOC [id number] Status is pending

## 2023-08-30 NOTE — Telephone Encounter (Signed)
 Pharmacy Patient Advocate Encounter  Received notification from [facility] ADVANTAGE/RX ADVANCE that Prior Authorization for Sherryll Burger has been APPROVED from 08/28/23 to 08/27/24   PA #/Case ID/Reference #: [id number]

## 2023-09-07 ENCOUNTER — Ambulatory Visit
Admission: RE | Admit: 2023-09-07 | Discharge: 2023-09-07 | Disposition: A | Discharge status: Home / Self Care | Payer: Medicare Other | Source: Ambulatory Visit | Attending: Family Medicine | Admitting: Family Medicine

## 2023-09-07 ENCOUNTER — Ambulatory Visit: Payer: Medicare Other

## 2023-09-07 DIAGNOSIS — R928 Other abnormal and inconclusive findings on diagnostic imaging of breast: Secondary | ICD-10-CM

## 2023-09-27 ENCOUNTER — Telehealth: Payer: Self-pay | Admitting: Cardiology

## 2023-09-27 MED ORDER — CARVEDILOL 3.125 MG PO TABS: 3.1250 mg | ORAL_TABLET | Freq: Two times a day (BID) | ORAL | 2 refills | Status: DC

## 2023-09-27 NOTE — Telephone Encounter (Signed)
*  STAT* If patient is at the pharmacy, call can be transferred to refill team.   1. Which medications need to be refilled? (please list name of each medication and dose if known) carvedilol (COREG) 3.125 MG tablet   2. Which pharmacy/location (including street and city if local pharmacy) is medication to be sent to?  [facility] - [place], [place] - [address]      3. Do they need a 30 day or 90 day supply? 90 day    Pt is out of medication

## 2023-09-27 NOTE — Telephone Encounter (Signed)
 Pt's medication was sent to pt's pharmacy as requested. Confirmation received.

## 2023-10-01 LAB — LIPID PANEL WITH LDL/HDL RATIO
Cholesterol, Total: 254 mg/dL — ABNORMAL HIGH (ref 100–199)
HDL: 35 mg/dL — ABNORMAL LOW (ref >39)
LDL Chol Calc (NIH): 150 mg/dL — ABNORMAL HIGH (ref 0–99)
LDL/HDL Ratio: 4.3 ratio — ABNORMAL HIGH (ref 0.0–3.2)
Triglycerides: 372 mg/dL — ABNORMAL HIGH (ref 0–149)
VLDL Cholesterol Cal: 69 mg/dL — ABNORMAL HIGH (ref 5–40)

## 2023-10-01 LAB — BASIC METABOLIC PANEL WITH GFR
BUN/Creatinine Ratio: 15 (ref 9–23)
BUN: 11 mg/dL (ref 6–24)
CO2: 25 mmol/L (ref 20–29)
Calcium: 9.5 mg/dL (ref 8.7–10.2)
Chloride: 102 mmol/L (ref 96–106)
Creatinine, Ser: 0.73 mg/dL (ref 0.57–1.00)
Glucose: 109 mg/dL — ABNORMAL HIGH (ref 70–99)
Potassium: 4.3 mmol/L (ref 3.5–5.2)
Sodium: 138 mmol/L (ref 134–144)
eGFR: 95 mL/min/{1.73_m2} (ref >59)

## 2023-10-02 ENCOUNTER — Encounter: Payer: Self-pay | Admitting: Cardiology

## 2023-10-02 NOTE — Progress Notes (Signed)
 Lipids are elevated and we could consider repatha as she could not tolerate statins. She has no known CAD but can try to get approval

## 2023-10-10 NOTE — Telephone Encounter (Signed)
 If we have some samples of Nexlizet can you please provide patient some or send Rx with free coupon

## 2023-10-12 MED ORDER — NEXLETOL 180 MG PO TABS: 1.0000 | ORAL_TABLET | Freq: Every day | ORAL | 0 refills | Status: DC

## 2023-10-29 ENCOUNTER — Ambulatory Visit: Payer: Medicare PPO | Admitting: Neurology

## 2023-11-06 ENCOUNTER — Encounter: Payer: Self-pay | Admitting: Hematology

## 2023-11-10 ENCOUNTER — Encounter: Payer: Self-pay | Admitting: Cardiology

## 2023-11-12 ENCOUNTER — Other Ambulatory Visit (HOSPITAL_COMMUNITY): Payer: Self-pay

## 2023-11-12 ENCOUNTER — Inpatient Hospital Stay: Payer: Medicare PPO | Attending: Internal Medicine

## 2023-11-12 ENCOUNTER — Telehealth: Payer: Self-pay

## 2023-11-12 DIAGNOSIS — E538 Deficiency of other specified B group vitamins: Secondary | ICD-10-CM

## 2023-11-12 DIAGNOSIS — D472 Monoclonal gammopathy: Secondary | ICD-10-CM | POA: Insufficient documentation

## 2023-11-12 LAB — CBC WITH DIFFERENTIAL (CANCER CENTER ONLY)
Abs Immature Granulocytes: 0 10*3/uL (ref 0.00–0.07)
Basophils Absolute: 0 10*3/uL (ref 0.0–0.1)
Basophils Relative: 1 %
Eosinophils Absolute: 0 10*3/uL (ref 0.0–0.5)
Eosinophils Relative: 1 %
HCT: 40.6 % (ref 36.0–46.0)
Hemoglobin: 13.9 g/dL (ref 12.0–15.0)
Immature Granulocytes: 0 %
Lymphocytes Relative: 52 %
Lymphs Abs: 1.9 10*3/uL (ref 0.7–4.0)
MCH: 33.9 pg (ref 26.0–34.0)
MCHC: 34.2 g/dL (ref 30.0–36.0)
MCV: 99 fL (ref 80.0–100.0)
Monocytes Absolute: 0.3 10*3/uL (ref 0.1–1.0)
Monocytes Relative: 7 %
Neutro Abs: 1.4 10*3/uL — ABNORMAL LOW (ref 1.7–7.7)
Neutrophils Relative %: 39 %
Platelet Count: 228 10*3/uL (ref 150–400)
RBC: 4.1 MIL/uL (ref 3.87–5.11)
RDW: 12.8 % (ref 11.5–15.5)
WBC Count: 3.6 10*3/uL — ABNORMAL LOW (ref 4.0–10.5)
nRBC: 0 % (ref 0.0–0.2)

## 2023-11-12 LAB — CMP (CANCER CENTER ONLY)
ALT: 73 U/L — ABNORMAL HIGH (ref 0–44)
AST: 56 U/L — ABNORMAL HIGH (ref 15–41)
Albumin: 4.9 g/dL (ref 3.5–5.0)
Alkaline Phosphatase: 69 U/L (ref 38–126)
Anion gap: 4 — ABNORMAL LOW (ref 5–15)
BUN: 13 mg/dL (ref 6–20)
CO2: 28 mmol/L (ref 22–32)
Calcium: 10.1 mg/dL (ref 8.9–10.3)
Chloride: 102 mmol/L (ref 98–111)
Creatinine: 0.87 mg/dL (ref 0.44–1.00)
GFR, Estimated: >60 mL/min (ref >60)
Glucose, Bld: 96 mg/dL (ref 70–99)
Potassium: 4.1 mmol/L (ref 3.5–5.1)
Sodium: 134 mmol/L — ABNORMAL LOW (ref 135–145)
Total Bilirubin: 0.5 mg/dL (ref 0.0–1.2)
Total Protein: 8.2 g/dL — ABNORMAL HIGH (ref 6.5–8.1)

## 2023-11-12 LAB — VITAMIN B12: Vitamin B-12: 444 pg/mL (ref 180–914)

## 2023-11-12 NOTE — Telephone Encounter (Signed)
 Enrolling in cardio grant

## 2023-11-12 NOTE — Telephone Encounter (Signed)
 Pharmacy Patient Advocate Encounter  Insurance verification completed.   The patient is insured through [employer] test claim for JARDIANCE . Currently a quantity of 30 is a 30 day supply and the co-pay is 47 . The current 30 day co-pay is, $47.  No PA needed at this time.  This test claim was processed through [facility]- copay amounts may vary at other pharmacies due to pharmacy/plan contracts, or as the patient moves through the different stages of their insurance plan.

## 2023-11-12 NOTE — Telephone Encounter (Signed)
 Patient Advocate Encounter   The patient was approved for a [employer] grant that will help cover the cost of ENTRESTO  AND JARDIANCE   Total amount awarded, $4,500.  Effective: 10/13/23 - 10/11/24   [id number] NFA:[id number] HQION:[id number] [id number]   Grant under medicare verification, funds not available until grant has been reviewed and released in [facility] portal   Sherlie Distance, CPhT  Pharmacy Patient Advocate Specialist  Direct Number: [phone] Fax: [phone]

## 2023-11-12 NOTE — Telephone Encounter (Signed)
 Medicare plan verified in [facility] portal. Funds are now released. Faxed grant information and instructions to pharmacy. Pt informed via mychart.

## 2023-11-12 NOTE — Telephone Encounter (Signed)
 Pharmacy Patient Advocate Encounter  Insurance verification completed.   The patient is insured through [employer] claim for ENTRESTO . Currently a quantity of 60 is a 30 day supply and the co-pay is 47 . The current 30 day co-pay is, $47.  No PA needed at this time.  This test claim was processed through [facility]- copay amounts may vary at other pharmacies due to pharmacy/plan contracts, or as the patient moves through the different stages of their insurance plan.

## 2023-11-14 LAB — ANTI-PARIETAL ANTIBODY: Parietal Cell Antibody-IgG: 3.8 U (ref 0.0–20.0)

## 2023-11-14 LAB — INTRINSIC FACTOR ANTIBODIES: Intrinsic Factor: 1 [AU]/ml (ref 0.0–1.1)

## 2023-11-14 NOTE — Progress Notes (Signed)
 HEMATOLOGY ONCOLOGY PHONE VISIT NOTE  Date of service: 11/16/2023  Patient Care Team: Roosvelt Colla, MD as PCP - General (Family Medicine) Knox Perl, MD as PCP - Cardiology (Cardiology) Merriam Abbey, DO as Consulting Physician (Neurology) Tami Falcon, MD as Consulting Physician (Gastroenterology) Knox Perl, MD as Consulting Physician (Cardiology) Frankie Israel, MD as Consulting Physician (Hematology) Cloteal Daniels, MD (Orthopedic Surgery) Alejandra Amos, MD (Rehabilitation) Manya Sells, MD as Consulting Physician (Neurosurgery) Derrell Flight, MD as Consulting Physician (Neurosurgery) Rey Catholic, MD (Family Medicine) Arnie Lao, MD as Consulting Physician (Orthopedic Surgery) Thais Fill, MD as Consulting Physician (Dermatology) Puschinsky, Kristina Pfeiffer., MD (General Surgery) Daphine Eagle, MD as Consulting Physician (Psychiatry) Doretta Gant, OD (Optometry) Cletis Dakin, DMD (Dentistry) Syliva Even, MD as Consulting Physician (Family Medicine) Dr Knox Perl MD (cardiology) Dr Manya Sells - neurosurgery Dr Nicholas Bari MD - Rheumatology  Diagnosis: #1 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 Skeletal survey negative. Bone marrow deferred as per patient choice.  Current Treatment: observation  INTERVAL HISTORY: Mrs. Attridge is a 58 y.o. female who is being connected with via telemedicine visit for continued follow-up for IgG lambda MGUS. I had a phone visit with patient on 05/04/2023.  I connected with LISABETH MIAN on 11/15/2023 at  8:40 AM EDT by telephone visit and verified that I am speaking with the correct person using two identifiers.   I discussed the limitations, risks, security and privacy concerns of performing an evaluation and management service by telemedicine and the availability of in-person appointments. I also discussed with the patient that there may be a patient responsible  charge related to this service. The patient expressed understanding and agreed to proceed.   Other persons participating in the visit and their role in the encounter: none   Patient's location: home  Provider's location: [facility]   Chief Complaint: Evaluation and management of IgG lambda MGUS.    She complains of some fatigue and seasonal allergies. Patient has other no new symptoms. She denies any new bone pain, fever, chills, or night sweats.  Patient reports use of a fair amount of Tylenol .   She notes no other new medications or excess alcohol use.   She continues to follow up with cardiology closely.   Patient reports that her other medical issues have been fairly stable.   The results of her recent lab workup was discussed with her in detail.  REVIEW OF SYSTEMS:    10 Point review of Systems was done is negative except as noted above.    Past Medical History:  Diagnosis Date   Anemia    Anxiety panic attacks   Dr. Deborra Falter   Arthritis    knees   Cardiomyopathy, nonischemic (HCC) 04/07/2016   EF left ventricule calculated on Lexiscan  stress test at 33% with global hypokinesis.    Chronic back pain    Chronic neck pain    COVID-19 06/2021   treated with Paxlovid through [facility] in [place]   Depression    Dysrhythmia    pvc's with cardiomyopathy   Family history of adverse reaction to anesthesia    mother has problems waking up   Fibromyalgia    and neuropathy of feet   History of kidney stones    lithotripsy  03/2016   Hyperlipidemia    Hypertension    Kidney stone    History   Kidney stones h/o   Melanoma (HCC) RUE   Dr. Joanne Muckle  MGUS (monoclonal gammopathy of unknown significance) 06/2015   no treatment just watching recheck blood work 12/2015   Migraine    OTC med prn - stress related   Plantar fasciitis right   Renal stones 2008/2011/2017   Seasonal allergies    SVD (spontaneous vaginal delivery)    x 3   Tendonitis of foot right    Past Surgical  History:  Procedure Laterality Date   ABLATION ON ENDOMETRIOSIS N/A 11/24/2015   Procedure: ABLATION ON ENDOMETRIOSIS;  Surgeon: Matt Song, MD;  Location: [facility];  Service: Gynecology;  Laterality: N/A;   ANTERIOR LAT LUMBAR FUSION Right 06/15/2016   Procedure: RIGHT LUMBAR THREE-FOUR ANTEROLATERAL LUMBAR INTERBODY FUSION WITH LATERAL PLATE;  Surgeon: Manya Sells, MD;  Location: [facility] OR;  Service: Neurosurgery;  Laterality: Right;   BACK SURGERY  Dr.Stern, last sx Summer 2011   X 4  (L3-S1)   BACK SURGERY  06/2016   BACK SURGERY  07/20/2020   L2-3 lateral interbody (Left side approach), L2-3 Posterior Spinal Fusion at [facility]   BLADDER SUSPENSION N/A 11/24/2015   Procedure: TRANSVAGINAL TAPE (TVT) PROCEDURE;  Surgeon: Matt Song, MD;  Location: [facility];  Service: Gynecology;  Laterality: N/A;   C2-3 nerve ablation Right 06/2019   Dr. Allyn Arenas (per pt)   COLONOSCOPY     CYSTOCELE REPAIR  11/24/2015   Procedure: ANTERIOR REPAIR (CYSTOCELE);  Surgeon: Matt Song, MD;  Location: [facility];  Service: Gynecology;;   CYSTOSCOPY N/A 11/24/2015   Procedure: Orin Birk;  Surgeon: Matt Song, MD;  Location: [facility];  Service: Gynecology;  Laterality: N/A;   DILATION AND CURETTAGE OF UTERUS     ENDOMETRIAL ABLATION  08/2000   EYE SURGERY Bilateral    Lasik    KIDNEY STONE SURGERY  2011   LITHOTRIPSY  08/2017 (L), 03/2018 (R)   MELANOMA EXCISION  05/2015   right forearm; Dr. Joanne Muckle   NECK SURGERY  [name]  Summer 2011   X 4  (C4-T1 Level)   RHINOPLASTY     ROBOTIC ASSISTED BILATERAL SALPINGO OOPHERECTOMY Bilateral 11/24/2015   Procedure: ROBOTIC ASSISTED BILATERAL SALPINGO OOPHORECTOMY;  Surgeon: Matt Song, MD;  Location: [facility];  Service: Gynecology;  Laterality: Bilateral;--OVARIES NOT REMOVED PER PATH REPORT   ROBOTIC ASSISTED TOTAL HYSTERECTOMY WITH SALPINGECTOMY Bilateral 11/24/2015   Procedure: ROBOTIC ASSISTED TOTAL HYSTERECTOMY WITH SALPINGECTOMY;  Surgeon: Matt Song, MD;  Location: [facility];  Service: Gynecology;  Laterality: Bilateral;   TONSILLECTOMY     TOTAL VAGINAL HYSTERECTOMY     WISDOM TOOTH EXTRACTION      Social History   Tobacco Use   Smoking status: Former    Current packs/day: 0.00    Average packs/day: 0.1 packs/day for 3.0 years (0.3 ttl pk-yrs)    Types: Cigarettes    Start date: 07/03/1984    Quit date: 07/04/1987    Years since quitting: 36.3   Smokeless tobacco: Never  Vaping Use   Vaping status: Never Used  Substance Use Topics   Alcohol use: Not Currently    Comment: typically 2/week, up to 4/week   Drug use: No   ALLERGIES:  is allergic to penicillins, codeine, voltaren  [diclofenac ], zetia  [ezetimibe ], crestor  [rosuvastatin ], diclofenac  sodium, pravastatin , vytorin  [ezetimibe -simvastatin ], and erythromycin.  MEDICATIONS:  Current Outpatient Medications  Medication Sig Dispense Refill   Bempedoic Acid  (NEXLETOL ) 180 MG TABS Take 1 tablet (180 mg total) by mouth daily at 6 (six) AM. 7 tablet 0   Bempedoic Acid  (NEXLETOL ) 180 MG TABS Take 1 tablet (180  mg total) by mouth daily. 21 tablet 0   buPROPion  (WELLBUTRIN  XL) 150 MG 24 hr tablet Take by mouth.     carvedilol  (COREG ) 3.125 MG tablet Take 1 tablet (3.125 mg total) by mouth 2 (two) times daily with a meal. 180 tablet 2   empagliflozin  (JARDIANCE ) 10 MG TABS tablet TAKE 1 TABLET BY MOUTH DAILY BEFORE BREAKFAST 90 tablet 3   EPINEPHrine  0.3 mg/0.3 mL IJ SOAJ injection Inject 0.3 mg into the muscle as needed for anaphylaxis. 1 each 0   FLUoxetine  (PROZAC ) 40 MG capsule Take 2 capsules by mouth daily.     loratadine  (CLARITIN ) 10 MG tablet Take 10 mg by mouth daily.     LORazepam  (ATIVAN ) 0.5 MG tablet Take 1-2 tablets (0.5-1 mg total) by mouth 3 (three) times daily as needed for anxiety. Prn anxiety 30 tablet 0   NON FORMULARY Take 1 tablet by mouth daily. Magnesium - Calcium  - Potassium - Vitamin D      rosuvastatin  (CRESTOR ) 10 MG tablet Take 1 tablet (10 mg total)  by mouth daily. 30 tablet 3   sacubitril -valsartan  (ENTRESTO ) 49-51 MG Take 1 tablet by mouth 2 (two) times daily. 180 tablet 3   tiZANidine  (ZANAFLEX ) 2 MG tablet Take 1 tablet (2 mg total) by mouth every 6 (six) hours as needed for muscle spasms. 30 tablet 11   Vitamin D , Ergocalciferol , (DRISDOL ) 1.25 MG (50000 UNIT) CAPS capsule Take 1 capsule (50,000 Units total) by mouth every 7 (seven) days. 12 capsule 0   No current facility-administered medications for this visit.    PHYSICAL EXAMINATION: TELEMEDICINE VISIT  LABORATORY DATA:   I have reviewed the data as listed  .    Latest Ref Rng & Units 11/12/2023    8:05 AM 04/30/2023    8:01 AM 10/23/2022    9:35 AM  CBC  WBC 4.0 - 10.5 K/uL 3.6  3.7  4.3   Hemoglobin 12.0 - 15.0 g/dL 40.9  81.1  91.4   Hematocrit 36.0 - 46.0 % 40.6  37.2  39.8   Platelets 150 - 400 K/uL 228  249  226     .    Latest Ref Rng & Units 11/12/2023    8:05 AM 10/01/2023    8:27 AM 05/23/2023    9:52 AM  CMP  Glucose 70 - 99 mg/dL 96  782  99   BUN 6 - 20 mg/dL 13  11    Creatinine 9.56 - 1.00 mg/dL 2.13  0.86    Sodium 578 - 145 mmol/L 134  138    Potassium 3.5 - 5.1 mmol/L 4.1  4.3    Chloride 98 - 111 mmol/L 102  102    CO2 22 - 32 mmol/L 28  25    Calcium  8.9 - 10.3 mg/dL 46.9  9.5    Total Protein 6.5 - 8.1 g/dL 8.2     Total Bilirubin 0.0 - 1.2 mg/dL 0.5     Alkaline Phos 38 - 126 U/L 69     AST 15 - 41 U/L 56     ALT 0 - 44 U/L 73      RADIOGRAPHIC STUDIES: I have personally reviewed the radiological images as listed and agreed with the findings in the report.  No results found.   ASSESSMENT & PLAN:   58 y.o. 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  Previous UPEP and skeletal survey negative. Patient has no evidence of anemia, renal failure on labs. No hypercalcemia. No new bone  pain. SPEP shows stable M-spike at 1g/dl with no significant increase over the last couple of follow-ups. SFLC ratio within normal limits -12/21/18 MRI Cervical spine which did not reveal any overt indication/concern for bone tumors. Continue follow up with Orthopedics and neurology. 2) Systolic nonischemic CHF patient previously noted improvement in her EF from 33% to 45%. She subsequently noted her EF normalized to 55% as of 06/19/18 clinic visit.  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 4) Peripheral radiculopathy /neuropathy -Is currently followed by Nigel Bart of Neurosurgery as well as Dr Festus Hubert of Neurology who have begun to work this up further. -electromyography showed evidence of radiculopathy, mild in degree, and without evidence of sensorimotor polyneuropathy.   Cannot r/o small fiber neuropathy -  Evidence of this on epidermal nerve fiber density analysis.  PLAN:  -Discussed lab results from 11/12/2023 in detail with patient. CBC showed WBC of 3.6K, hemoglobin of 13.9, and platelets of 228K. -there is no anemia -WBCs mildly improved though they continue to be borderline low  -platelets okay -myeloma panel from 5/12  -no sign or lab evidence of IgG lambda MGUS progression at this time  -vitamin B12 levels normal at 444 pg/mL -patient has normal parietal cell intrinsic factor antibody suggesting against pernicious anemia -CMP shows increase in liver enzymes with AST 56 and ALT 73 -discussed that her increased levels could be from her use of a fair amount of Tylenol  and allergy issues -discussed avoiding excess use of Tylenol  -Recommend patient to follow-up with her PCP to monitor her liver enzymes and ensure normalization with repeat labs in 2 months -continue close follow-up with cardiology  FOLLOW-UP: Phone visit with Dr Salomon Cree in 6 months Labs 7-10  days prior to phone visit  The total time spent in the appointment was 20 minutes* .  All of the patient's questions were answered with apparent satisfaction. The patient knows to call the clinic with any problems, questions or concerns.   Jacquelyn Matt MD MS AAHIVMS [facility] Hematology/Oncology Physician [facility]  .*Total Encounter Time as defined by the Centers for Medicare and Medicaid Services includes, in addition to the face-to-face time of a patient visit (documented in the note above) non-face-to-face time: obtaining and reviewing outside history, ordering and reviewing medications, tests or procedures, care coordination (communications with other health care professionals or caregivers) and documentation in the medical record.    I,Mitra Faeizi,acting as a Neurosurgeon for Jacquelyn Matt, MD.,have documented all relevant documentation on the behalf of Jacquelyn Matt, MD,as directed by  Jacquelyn Matt, MD while in the presence of Jacquelyn Matt, MD.  .I have reviewed the above documentation for accuracy and completeness, and I agree with the above. .Antrice Pal Kishore Makenzey Nanni MD

## 2023-11-16 ENCOUNTER — Inpatient Hospital Stay (HOSPITAL_BASED_OUTPATIENT_CLINIC_OR_DEPARTMENT_OTHER): Payer: Medicare PPO | Admitting: Hematology

## 2023-11-16 DIAGNOSIS — D472 Monoclonal gammopathy: Secondary | ICD-10-CM | POA: Diagnosis not present

## 2023-11-19 LAB — MULTIPLE MYELOMA PANEL, SERUM
Albumin SerPl Elph-Mcnc: 4.2 g/dL (ref 2.9–4.4)
Albumin/Glob SerPl: 1.3 (ref 0.7–1.7)
Alpha 1: 0.1 g/dL (ref 0.0–0.4)
Alpha2 Glob SerPl Elph-Mcnc: 0.7 g/dL (ref 0.4–1.0)
B-Globulin SerPl Elph-Mcnc: 1.1 g/dL (ref 0.7–1.3)
Gamma Glob SerPl Elph-Mcnc: 1.5 g/dL (ref 0.4–1.8)
Globulin, Total: 3.4 g/dL (ref 2.2–3.9)
IgA: 57 mg/dL — ABNORMAL LOW (ref 87–352)
IgG (Immunoglobin G), Serum: 1550 mg/dL (ref 586–1602)
IgM (Immunoglobulin M), Srm: 51 mg/dL (ref 26–217)
M Protein SerPl Elph-Mcnc: 1 g/dL — ABNORMAL HIGH
Total Protein ELP: 7.6 g/dL (ref 6.0–8.5)

## 2023-11-20 NOTE — Progress Notes (Signed)
 NEUROLOGY FOLLOW UP OFFICE NOTE  Lindsey Rodriguez [id number]  Assessment/Plan:   1.  Migraine without aura, without status migrainosus, not intractable 2.  Occipital neuralgia/cervicogenic headache, stable 3.  Tension-type headache, not intractable 4.  Small fiber neuropathy, idiopathic - some progression   1.  Tizanidine  as needed for headache  2.   For neuralgia, recommended trying carbamazepine.  She defers at this time but will contact me if needed 3.   Advised walking, exercise, yoga to help maintain balance 4.  Follow up one year or as needed.    Subjective:  Lindsey Rodriguez is a 58 year old right-handed female with chronic neck and back pain with neuropathy, fibromyalgia, non-ischemic cardiomyopathy, MGUS, hypertension, plantar fasciitis, and history of kidney sones, depression and lumbosacral radiculopathy who follows up for migraines and small fiber neuropathy   UPDATE: Migraines/Headaches: Headaches are overall controlled, especially considering current seasonal allergies.  They are more mild.  She has about 1 a week now, 2  are migraines and the rest are tension-type or sinus headaches.  Treats migraines with Extra-strength Tylenol , tizanidine  for tension type headache.  She has returned to work part-time which has not negatively impacted her headaches.    Small-fiber neuropathy: Neuropathy has gotten worse.  Worse when tired.  More severe in her toes.  Tingling up to above ankles.  Burning and stinging in toes but also now notes numbness in the toes as well.  Sometimes feels more unsteady on her feet but no falls.      Current NSAIDS: none Current analgesics:  Extra-strength Tylenol  Current triptans:  no Current ergotamine:  no Current anti-emetic:  no Current muscle relaxants:  Tizanidine  4mg  PRN (also for neck stiffness) Current anti-anxiolytic:  lorazepam  Current sleep aide:  no Current Antihypertensive medications:  Coreg  Current Antidepressant medications:   Wellbutrin , fluoxetine  80mg  Current Anticonvulsant medications:  none Current anti-CGRP:  None Current Vitamins/Herbal/Supplements:  CoQ10 30mg ; 65 Fe Current Antihistamines/Decongestants:  loratadine  Other therapy:  no    Depression:  yes; Anxiety:  yes Other pain:  Chronic low back pain - lumbar disc disease, small fiber neuropathy, left golfer's elbow     HISTORY: Migraines: Onset:  Childhood Location:  Varies (back of head, crown, bi-temporal, retro-orbital, band-like) Quality:  Retro-orbital throbbing, band-like vice, stabbing at back of head Initial Intensity:  4/10 constant, 8/10 when severe; August: Tension 6-9/10; Migraine 9/10 Aura:  no Prodrome:  no Associated symptoms:  Nausea, photophobia, phonophobia, tunnel vision.  No associated vomiting or unilateral numbness or weakness. Initial Duration:  Constant but severe episodes 8 hours (with sumatriptan  and naproxen ); August: Tension to 3 hours; Migraine until goes to sleep Initial Frequency:  Daily but severe episodes occur once a week; August: Tension 3 days per week; Migraine once every 2 weeks) Triggers/aggravating factors:  Emotional stress, working at the computer; seasonal allergies. Relieving factors:  Ice pack and laying in dark room Activity:  Cannot function at least once a week.   Chronic neck pain/cervicogenic headache: She has chronic neck pain status post C4-T2 fusion.  Due to worsening neck pain, MRI of cervical spine was performed on 12/21/2018 which demonstrated multilevel degenrative changes with mild facet disease at C2-3 and C3-4, post-surgical C4-T2 fusion with chronic deformity of right hemicord at C4-5, central and leftward protrusion at C3-4 with partial regression of disc herniation (since 2016) and slightly greater anterolisthesis related to facet arthropathy with slight left-sided cord flattening and slight left C4 foraminal narrowing.  She was referred to Dr.  Saulo at [facility].   She has received two nerve blocks which were effective.  She underwent upper cervical nerve root ablation around December 2020 which has been effective for the neck pain and occipital neuralgia.    Small-fiber Neuropathy:  In 2020, she also reported increased numbness and tingling in the feet (worse on the right).  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  Labs from January 2022, including B12, Sjogren's, Sed rate, ANA, and TSH were normal.  B6 was elevated at 130.6.  She was advised to discontinue any B6 containing foods.  Immunofixation from March 2022 showed IgG monoclonal protein with lambda light chain (patient has MGUS).  ACE level from April 2022 was 12.  Punch skin biopsy was indicative of small fiber neuropathy  Past medication: Past NSAIDs:  Naproxen ; ibuprofen  Past analgesics:  Tramadol , lidocaine  nasal drops Past triptans:  Relpax  40mg , Treximet , Maxalt, Zomig  no, Zomig  NS, sumatriptan  100mg , sumatriptan  NS, Onzetra Xsail  Past antihypertensives:  Propranolol  (caused hypotension), amlodipine , lisinopril  Past antidepressants:  Cymbalta, nortriptyline, venlafaxine, sertraline Past antiepileptics:  Topiramate, zonisamide , Lyrica  (cognitive difficulties), Depakote , gabapentin  (sleepiness). Past anti-CGRP:  Aimovig , Emgality  (could not afford) Other past therapy:  biofeedback  PAST MEDICAL HISTORY: Past Medical History:  Diagnosis Date   Anemia    Anxiety panic attacks   Dr. Deborra Falter   Arthritis    knees   Cardiomyopathy, nonischemic (HCC) 04/07/2016   EF left ventricule calculated on Lexiscan  stress test at 33% with global hypokinesis.    Chronic back pain    Chronic neck pain    COVID-19 06/2021   treated with Paxlovid through [facility] in [place]   Depression    Dysrhythmia    pvc's with cardiomyopathy   Family history of adverse reaction to anesthesia    mother has problems waking up   Fibromyalgia    and neuropathy of feet   History of kidney stones    lithotripsy   03/2016   Hyperlipidemia    Hypertension    Kidney stone    History   Kidney stones h/o   Melanoma (HCC) RUE   Dr. Joanne Muckle   MGUS (monoclonal gammopathy of unknown significance) 06/2015   no treatment just watching recheck blood work 12/2015   Migraine    OTC med prn - stress related   Plantar fasciitis right   Renal stones 2008/2011/2017   Seasonal allergies    SVD (spontaneous vaginal delivery)    x 3   Tendonitis of foot right    MEDICATIONS: Current Outpatient Medications on File Prior to Visit  Medication Sig Dispense Refill   Bempedoic Acid  (NEXLETOL ) 180 MG TABS Take 1 tablet (180 mg total) by mouth daily at 6 (six) AM. 7 tablet 0   Bempedoic Acid  (NEXLETOL ) 180 MG TABS Take 1 tablet (180 mg total) by mouth daily. 21 tablet 0   buPROPion  (WELLBUTRIN  XL) 150 MG 24 hr tablet Take by mouth.     carvedilol  (COREG ) 3.125 MG tablet Take 1 tablet (3.125 mg total) by mouth 2 (two) times daily with a meal. 180 tablet 2   empagliflozin  (JARDIANCE ) 10 MG TABS tablet TAKE 1 TABLET BY MOUTH DAILY BEFORE BREAKFAST 90 tablet 3   EPINEPHrine  0.3 mg/0.3 mL IJ SOAJ injection Inject 0.3 mg into the muscle as needed for anaphylaxis. 1 each 0   FLUoxetine  (PROZAC ) 40 MG capsule Take 2 capsules by mouth daily.     loratadine  (CLARITIN ) 10 MG tablet Take 10 mg by mouth daily.  LORazepam  (ATIVAN ) 0.5 MG tablet Take 1-2 tablets (0.5-1 mg total) by mouth 3 (three) times daily as needed for anxiety. Prn anxiety 30 tablet 0   NON FORMULARY Take 1 tablet by mouth daily. Magnesium - Calcium  - Potassium - Vitamin D      rosuvastatin  (CRESTOR ) 10 MG tablet Take 1 tablet (10 mg total) by mouth daily. 30 tablet 3   sacubitril -valsartan  (ENTRESTO ) 49-51 MG Take 1 tablet by mouth 2 (two) times daily. 180 tablet 3   tiZANidine  (ZANAFLEX ) 2 MG tablet Take 1 tablet (2 mg total) by mouth every 6 (six) hours as needed for muscle spasms. 30 tablet 11   Vitamin D , Ergocalciferol , (DRISDOL ) 1.25 MG (50000 UNIT) CAPS  capsule Take 1 capsule (50,000 Units total) by mouth every 7 (seven) days. 12 capsule 0   No current facility-administered medications on file prior to visit.    ALLERGIES: Allergies  Allergen Reactions   Penicillins Shortness Of Breath and Rash    Has patient had a PCN reaction causing immediate rash, facial/tongue/throat swelling, SOB or lightheadedness with hypotension: no Has patient had a PCN reaction causing severe rash involving mucus membranes or skin necrosis: no Has patient had a PCN reaction that required hospitalization no Has patient had a PCN reaction occurring within the last 10 years: no If all of the above answers are "NO", then may proceed with Cephalosporin use.  Other reaction(s): Unknown   Codeine Other (See Comments)    Manic depressive Other reaction(s): Unknown   Voltaren  [Diclofenac ] Nausea And Vomiting and Other (See Comments)    Pt stated "tore stomach up"   Zetia  [Ezetimibe ] Nausea Only   Crestor  [Rosuvastatin ] Other (See Comments)    Myalgia   Diclofenac  Sodium    Pravastatin  Nausea Only   Vytorin  [Ezetimibe -Simvastatin ] Other (See Comments)    Severe myalgia   Erythromycin Rash    Other reaction(s): Unknown    FAMILY HISTORY: Family History  Problem Relation Age of Onset   Hypertension Mother    Depression Mother    Migraines Mother    Hypothyroidism Mother    Hypercholesterolemia Mother    Anxiety disorder Mother    Arthritis Mother    COPD Father    Heart failure Father 27   Arthritis Father    Heart disease Father    Cardiomyopathy Daughter 8       related to COVID (illness or vaccine?)   Healthy Daughter    Healthy Daughter    Cancer Maternal Grandmother    Depression Maternal Grandmother    Heart disease Maternal Grandmother    Cancer Maternal Grandfather    Healthy Brother    Healthy Son    Diabetes Neg Hx    BRCA 1/2 Neg Hx    Breast cancer Neg Hx       Objective:  Blood pressure 125/75, pulse 63, height 5\' 6"  (1.676  m), weight 197 lb (89.4 kg), last menstrual period 12/16/2012, SpO2 99%. General: No acute distress.  Patient appears well-groomed.   Head:  Normocephalic/atraumatic Eyes:  Fundi examined but not visualized Neck: supple, bilateral paraspinal tenderness, full range of motion Heart:  Regular rate and rhythm Lungs:  Clear to auscultation bilaterally Back: bilateral paraspinal tenderness Neurological Exam: alert and oriented.  Speech fluent and not dysarthric, language intact.  CN II-XII intact. Bulk and tone normal, muscle strength 5/5 throughout.  Sensation to pinprick reduced in toes up to the ankles.  Vibratory sensation reduced in toes.  Deep tendon reflexes 2+ throughout.  Finger to nose testing intact.  Gait steady, able to turn, difficulty tandem walking, Romberg negative.    Janne Members, DO  CC: Roosvelt Colla, MD

## 2023-11-21 ENCOUNTER — Ambulatory Visit (INDEPENDENT_AMBULATORY_CARE_PROVIDER_SITE_OTHER): Payer: Medicare PPO | Admitting: Neurology

## 2023-11-21 ENCOUNTER — Encounter: Payer: Self-pay | Admitting: Neurology

## 2023-11-21 VITALS — BP 125/75 | HR 63 | Ht 66.0 in | Wt 197.0 lb

## 2023-11-21 DIAGNOSIS — M5481 Occipital neuralgia: Secondary | ICD-10-CM

## 2023-11-21 DIAGNOSIS — G44219 Episodic tension-type headache, not intractable: Secondary | ICD-10-CM

## 2023-11-21 DIAGNOSIS — G43009 Migraine without aura, not intractable, without status migrainosus: Secondary | ICD-10-CM

## 2023-11-21 DIAGNOSIS — G6289 Other specified polyneuropathies: Secondary | ICD-10-CM

## 2023-11-21 MED ORDER — TIZANIDINE HCL 2 MG PO TABS: 2.0000 mg | ORAL_TABLET | Freq: Four times a day (QID) | ORAL | 11 refills | Status: AC | PRN

## 2023-11-21 NOTE — Patient Instructions (Signed)
 Continue tizanidine  as needed If pain worsens, contact me and we can try carbamazepine Continue walking, exercise, yoga

## 2023-11-27 ENCOUNTER — Inpatient Hospital Stay (HOSPITAL_BASED_OUTPATIENT_CLINIC_OR_DEPARTMENT_OTHER): Admission: RE | Admit: 2023-11-27 | Source: Ambulatory Visit

## 2024-02-03 NOTE — Progress Notes (Unsigned)
 No chief complaint on file.  Patient presents for follow-up on elevated liver enzymes, per the request of Dr. Onesimo. She last saw him (via virtual visit) 11/16/23.  CBC and myeloma labs stable, no MGUS progression. She was noted to have elevated AST 56 and ALT 73, mentioned possibly due to tylenol  intake. He encouraged her to limit tylenol , and to have labs rechecked in 2 months.       Lipids are monitored by cardiology, elevated on last check. She didn't tolerate statin, were going to look into possibility of repatha, but she didn't have any known CAD. She had retried rosuvastatin  at a dose of 5 mg, but had recurrent headaches. Cardiology discussed samples of nexlizet or nexlitol *** UPDATE   She had Ct coronary scan in 08/2023, with a calcium  score of 0 and no aortic atherosclerosis noted.  Lab Results  Component Value Date   CHOL 254 (H) 10/01/2023   HDL 35 (L) 10/01/2023   LDLCALC 150 (H) 10/01/2023   TRIG 372 (H) 10/01/2023   CHOLHDL 6.2 (H) 04/26/2022     PMH, PSH, SH reviewed   ROS:    PHYSICAL EXAM:  LMP 12/16/2012   Wt Readings from Last 3 Encounters:  11/21/23 197 lb (89.4 kg)  07/31/23 198 lb 6.4 oz (90 kg)  05/23/23 201 lb 3.2 oz (91.3 kg)         Chemistry      Component Value Date/Time   NA 134 (L) 11/12/2023 0805   NA 138 10/01/2023 0827   NA 136 06/20/2017 0857   K 4.1 11/12/2023 0805   K 4.4 06/20/2017 0857   CL 102 11/12/2023 0805   CO2 28 11/12/2023 0805   CO2 25 06/20/2017 0857   BUN 13 11/12/2023 0805   BUN 11 10/01/2023 0827   BUN 11.2 06/20/2017 0857   CREATININE 0.87 11/12/2023 0805   CREATININE 0.9 06/20/2017 0857      Component Value Date/Time   CALCIUM  10.1 11/12/2023 0805   CALCIUM  9.9 06/20/2017 0857   ALKPHOS 69 11/12/2023 0805   ALKPHOS 64 06/20/2017 0857   AST 56 (H) 11/12/2023 0805   AST 21 06/20/2017 0857   ALT 73 (H) 11/12/2023 0805   ALT 19 06/20/2017 0857   BILITOT 0.5 11/12/2023 0805   BILITOT 0.51 06/20/2017  0857     Lab Results  Component Value Date   WBC 3.6 (L) 11/12/2023   HGB 13.9 11/12/2023   HCT 40.6 11/12/2023   MCV 99.0 11/12/2023   PLT 228 11/12/2023      ASSESSMENT/PLAN:   ?taking any fish oil?   Discuss lipids/diet, can contribute to fatty liver, elevated LFT

## 2024-02-04 ENCOUNTER — Ambulatory Visit (INDEPENDENT_AMBULATORY_CARE_PROVIDER_SITE_OTHER): Admitting: Family Medicine

## 2024-02-04 ENCOUNTER — Encounter: Payer: Self-pay | Admitting: Family Medicine

## 2024-02-04 VITALS — BP 130/80 | HR 64 | Ht 66.0 in | Wt 199.2 lb

## 2024-02-04 DIAGNOSIS — D472 Monoclonal gammopathy: Secondary | ICD-10-CM

## 2024-02-04 DIAGNOSIS — J302 Other seasonal allergic rhinitis: Secondary | ICD-10-CM | POA: Diagnosis not present

## 2024-02-04 DIAGNOSIS — R7989 Other specified abnormal findings of blood chemistry: Secondary | ICD-10-CM | POA: Diagnosis not present

## 2024-02-04 DIAGNOSIS — E782 Mixed hyperlipidemia: Secondary | ICD-10-CM | POA: Diagnosis not present

## 2024-02-04 NOTE — Patient Instructions (Signed)
 VISIT SUMMARY:  Today, we re-evaluated your elevated liver enzymes and discussed your cholesterol levels and allergy management. We also reviewed your stable MGUS condition.  YOUR PLAN:  ELEVATED LIVER ENZYMES: Your liver enzymes (AST and ALT) are elevated, possibly due to fatty liver changes from high cholesterol and triglycerides. There is a family history of non-alcoholic fatty liver disease. -We will recheck your liver enzymes with a CMP and GGT test. -If liver enzymes remain elevated, we may consider imaging (ultrasound) -We discussed dietary changes to reduce cholesterol and triglycerides.   MIXED HYPERLIPIDEMIA: You have high cholesterol and triglycerides, which increase your cardiovascular risk and may contribute to fatty liver changes. -Discuss with your cardiologist about trying Nexletol  or Nexlizet, as they had suggested. -I recommend taking fish oil supplements, 3000-4000 mg per day. This may help lower triglycerides (won't have effect on the LDL). -Follow up with your cardiologist regarding Vascepa or Lovaza if fish oil is not tolerated.   SEASONAL ALLERGIC RHINITIS:  Avoid use ofTylenol allergy sinus. -Use Flonase daily during allergy season. -Use antihistamines like loratadine , Zyrtec, or Allegra as needed. -Perform sinus rinses with boiled or distilled water  once or twice daily as needed.

## 2024-02-05 ENCOUNTER — Ambulatory Visit: Payer: Self-pay | Admitting: Family Medicine

## 2024-02-05 DIAGNOSIS — R7989 Other specified abnormal findings of blood chemistry: Secondary | ICD-10-CM

## 2024-02-05 LAB — COMPREHENSIVE METABOLIC PANEL WITH GFR
ALT: 75 IU/L — ABNORMAL HIGH (ref 0–32)
AST: 71 IU/L — ABNORMAL HIGH (ref 0–40)
Albumin: 4.7 g/dL (ref 3.8–4.9)
Alkaline Phosphatase: 70 IU/L (ref 44–121)
BUN/Creatinine Ratio: 17 (ref 9–23)
BUN: 14 mg/dL (ref 6–24)
Bilirubin Total: 0.6 mg/dL (ref 0.0–1.2)
CO2: 19 mmol/L — ABNORMAL LOW (ref 20–29)
Calcium: 9.4 mg/dL (ref 8.7–10.2)
Chloride: 100 mmol/L (ref 96–106)
Creatinine, Ser: 0.81 mg/dL (ref 0.57–1.00)
Globulin, Total: 2.7 g/dL (ref 1.5–4.5)
Glucose: 97 mg/dL (ref 70–99)
Potassium: 4.2 mmol/L (ref 3.5–5.2)
Sodium: 134 mmol/L (ref 134–144)
Total Protein: 7.4 g/dL (ref 6.0–8.5)
eGFR: 84 mL/min/1.73 (ref >59)

## 2024-02-05 LAB — GAMMA GT: GGT: 47 IU/L (ref 0–60)

## 2024-02-07 ENCOUNTER — Inpatient Hospital Stay
Admission: RE | Admit: 2024-02-07 | Discharge: 2024-02-07 | Discharge status: Home / Self Care | Source: Ambulatory Visit | Attending: Family Medicine | Admitting: Family Medicine

## 2024-02-07 DIAGNOSIS — R7989 Other specified abnormal findings of blood chemistry: Secondary | ICD-10-CM

## 2024-02-12 ENCOUNTER — Ambulatory Visit: Payer: Self-pay | Admitting: Family Medicine

## 2024-05-13 ENCOUNTER — Other Ambulatory Visit: Payer: Self-pay

## 2024-05-13 DIAGNOSIS — D472 Monoclonal gammopathy: Secondary | ICD-10-CM

## 2024-05-14 ENCOUNTER — Other Ambulatory Visit

## 2024-05-14 ENCOUNTER — Inpatient Hospital Stay: Attending: Hematology

## 2024-05-14 DIAGNOSIS — Z85820 Personal history of malignant melanoma of skin: Secondary | ICD-10-CM | POA: Diagnosis not present

## 2024-05-14 DIAGNOSIS — Z87891 Personal history of nicotine dependence: Secondary | ICD-10-CM | POA: Insufficient documentation

## 2024-05-14 DIAGNOSIS — D472 Monoclonal gammopathy: Secondary | ICD-10-CM | POA: Insufficient documentation

## 2024-05-14 LAB — CMP (CANCER CENTER ONLY)
ALT: 69 U/L — ABNORMAL HIGH (ref 0–44)
AST: 52 U/L — ABNORMAL HIGH (ref 15–41)
Albumin: 4.6 g/dL (ref 3.5–5.0)
Alkaline Phosphatase: 57 U/L (ref 38–126)
Anion gap: 6 (ref 5–15)
BUN: 15 mg/dL (ref 6–20)
CO2: 27 mmol/L (ref 22–32)
Calcium: 9.9 mg/dL (ref 8.9–10.3)
Chloride: 103 mmol/L (ref 98–111)
Creatinine: 0.86 mg/dL (ref 0.44–1.00)
GFR, Estimated: >60 mL/min (ref >60)
Glucose, Bld: 103 mg/dL — ABNORMAL HIGH (ref 70–99)
Potassium: 4.3 mmol/L (ref 3.5–5.1)
Sodium: 136 mmol/L (ref 135–145)
Total Bilirubin: 0.6 mg/dL (ref 0.0–1.2)
Total Protein: 7.9 g/dL (ref 6.5–8.1)

## 2024-05-14 LAB — CBC WITH DIFFERENTIAL (CANCER CENTER ONLY)
Abs Immature Granulocytes: 0.01 K/uL (ref 0.00–0.07)
Basophils Absolute: 0 K/uL (ref 0.0–0.1)
Basophils Relative: 1 %
Eosinophils Absolute: 0.1 K/uL (ref 0.0–0.5)
Eosinophils Relative: 2 %
HCT: 36.6 % (ref 36.0–46.0)
Hemoglobin: 12.4 g/dL (ref 12.0–15.0)
Immature Granulocytes: 0 %
Lymphocytes Relative: 56 %
Lymphs Abs: 1.9 K/uL (ref 0.7–4.0)
MCH: 34.3 pg — ABNORMAL HIGH (ref 26.0–34.0)
MCHC: 33.9 g/dL (ref 30.0–36.0)
MCV: 101.1 fL — ABNORMAL HIGH (ref 80.0–100.0)
Monocytes Absolute: 0.3 K/uL (ref 0.1–1.0)
Monocytes Relative: 9 %
Neutro Abs: 1.1 K/uL — ABNORMAL LOW (ref 1.7–7.7)
Neutrophils Relative %: 32 %
Platelet Count: 201 K/uL (ref 150–400)
RBC: 3.62 MIL/uL — ABNORMAL LOW (ref 3.87–5.11)
RDW: 13.6 % (ref 11.5–15.5)
WBC Count: 3.4 K/uL — ABNORMAL LOW (ref 4.0–10.5)
nRBC: 0 % (ref 0.0–0.2)

## 2024-05-19 LAB — MULTIPLE MYELOMA PANEL, SERUM
Albumin SerPl Elph-Mcnc: 4.3 g/dL (ref 2.9–4.4)
Albumin/Glob SerPl: 1.4 (ref 0.7–1.7)
Alpha 1: 0.1 g/dL (ref 0.0–0.4)
Alpha2 Glob SerPl Elph-Mcnc: 0.7 g/dL (ref 0.4–1.0)
B-Globulin SerPl Elph-Mcnc: 1 g/dL (ref 0.7–1.3)
Gamma Glob SerPl Elph-Mcnc: 1.4 g/dL (ref 0.4–1.8)
Globulin, Total: 3.2 g/dL (ref 2.2–3.9)
IgA: 57 mg/dL — ABNORMAL LOW (ref 87–352)
IgG (Immunoglobin G), Serum: 1605 mg/dL — ABNORMAL HIGH (ref 586–1602)
IgM (Immunoglobulin M), Srm: 50 mg/dL (ref 26–217)
M Protein SerPl Elph-Mcnc: 1.1 g/dL — ABNORMAL HIGH
Total Protein ELP: 7.5 g/dL (ref 6.0–8.5)

## 2024-05-23 ENCOUNTER — Telehealth: Admitting: Hematology

## 2024-05-26 NOTE — Progress Notes (Signed)
 HEMATOLOGY ONCOLOGY PROGRESS NOTE  Date of service: 05/28/2024  Patient Care Team: Randol Dawes, MD as PCP - General (Family Medicine) Ladona Heinz, MD as PCP - Cardiology (Cardiology) Skeet Juliene SAUNDERS, DO as Consulting Physician (Neurology) Kristie Lamprey, MD as Consulting Physician (Gastroenterology) Ladona Heinz, MD as Consulting Physician (Cardiology) Onesimo Emaline Brink, MD as Consulting Physician (Hematology) Gust Royden ORN, MD (Orthopedic Surgery) Darlean Ned, MD (Rehabilitation) Unice Pac, MD as Consulting Physician (Neurosurgery) Melonie Grass, MD as Consulting Physician (Neurosurgery) Hughie Sharper, MD (Family Medicine) Vernetta Lonni GRADE, MD as Consulting Physician (Orthopedic Surgery) Robinson Pao, MD as Consulting Physician (Dermatology) Puschinsky, Charlie ORN., MD (General Surgery) Vincente Grip, MD as Consulting Physician (Psychiatry) Lynell Anes, OD (Optometry) Trudy No, DMD (Dentistry) Joane Artist RAMAN, MD as Consulting Physician (Family Medicine) Dr Heinz Ladona MD (cardiology) Dr Pac Unice - neurosurgery Dr Maya Nash MD - Rheumatology  CHIEF COMPLAINT/PURPOSE OF CONSULTATION: Follow-up for continued evaluation and management of #1 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 Skeletal survey negative. Bone marrow deferred as per patient choice.   HISTORY OF PRESENTING ILLNESS: (03/12/2015) Lindsey Rodriguez is a wonderful 58 y.o. female who has been referred to us  by Dr .RANDOL DAWES LABOR, MD and Dr Nash for evaluation and management of monoclonal gammopathy of undetermined significance.   Patient has a history of hypertension, significant anxiety, significant problems with degenerative disc disease throughout her spine status post cervical discectomy and fusion as well as lumbar discectomy and fusion, plantar fasciitis and tendinitis who has been following with Dr. Nash from rheumatology for evaluation of  her current back pain issues.  She was noted to be HLA-B27 positive but x-rays did not show overt ankylosing spondylitis.  She notes that she was also recently diagnosed with fibromyalgia.  As a part of her workup she had an SPEP    Which showed an M protein spike of 1 g/dLwith IFE was noted to be an IgG lambda protein.  Quantitative immunoglobulins were within normal limits and IgG 1410, IgA of 103 and IgM of 56. Her sedimentation rate was 7.  Rheumatoid factor, CCP antibody negative.   She was referred to us  to rule out a plasma cell dyscrasia. Patient notes no focal bone pains though she has a lot of chronic arthralgias and myalgias. Labs have not revealed any anemia, renal failure or hypercalcemia. She has had no unexpected weight loss, night sweats, fevers or chills.   INTERVAL HISTORY:  I connected with SAMREET EDENFIELD on 05/28/2024 at  8:40 AM EST by telephone and verified that I am speaking with the correct person using two identifiers.   I discussed the limitations, risks, security and privacy concerns of performing an evaluation and management service by telemedicine and the availability of in-person appointments. I also discussed with the patient that there may be a patient responsible charge related to this service. The patient expressed understanding and agreed to proceed.   Other persons participating in the visit and their role in the encounter: none.  Patient's location: Home Provider's location: [facility]   Chief Complaint: continued evaluation and management of IgG lambda MGUS.   I last connected with her on 11/16/2023; at the time she  mentioned experiencing some fatigue and symptoms related to seasonal allergies, but was otherwise doing well.   Today, she notes no acute new symptoms. No new focal bones pains. No fevers/chills/nightsweats/unexpected weight loss. Recent labs were discussed in details.   REVIEW OF SYSTEMS:   10 Point review of systems  of done and is negative  except as noted above.  MEDICAL HISTORY Past Medical History:  Diagnosis Date   Anemia    Anxiety panic attacks   Dr. Vincente   Arthritis    knees   Cardiomyopathy, nonischemic (HCC) 04/07/2016   EF left ventricule calculated on Lexiscan  stress test at 33% with global hypokinesis.    Chronic back pain    Chronic neck pain    COVID-19 06/2021   treated with Paxlovid through [facility] in [place]   Depression    Dysrhythmia    pvc's with cardiomyopathy   Family history of adverse reaction to anesthesia    mother has problems waking up   Fibromyalgia    and neuropathy of feet   History of kidney stones    lithotripsy  03/2016   Hyperlipidemia    Hypertension    Kidney stone    History   Kidney stones h/o   Melanoma (HCC) RUE   Dr. Robinson   MGUS (monoclonal gammopathy of unknown significance) 06/2015   no treatment just watching recheck blood work 12/2015   Migraine    OTC med prn - stress related   Plantar fasciitis right   Renal stones 2008/2011/2017   Seasonal allergies    SVD (spontaneous vaginal delivery)    x 3   Tendonitis of foot right    SURGICAL HISTORY Past Surgical History:  Procedure Laterality Date   ABLATION ON ENDOMETRIOSIS N/A 11/24/2015   Procedure: ABLATION ON ENDOMETRIOSIS;  Surgeon: Rosaline Luna, MD;  Location: [facility];  Service: Gynecology;  Laterality: N/A;   ANTERIOR LAT LUMBAR FUSION Right 06/15/2016   Procedure: RIGHT LUMBAR THREE-FOUR ANTEROLATERAL LUMBAR INTERBODY FUSION WITH LATERAL PLATE;  Surgeon: Fairy Levels, MD;  Location: [facility];  Service: Neurosurgery;  Laterality: Right;   BACK SURGERY  Dr.Stern, last sx Summer 2011   X 4  (L3-S1)   BACK SURGERY  06/2016   BACK SURGERY  07/20/2020   L2-3 lateral interbody (Left side approach), L2-3 Posterior Spinal Fusion at [facility]   BLADDER SUSPENSION N/A 11/24/2015   Procedure: TRANSVAGINAL TAPE (TVT) PROCEDURE;  Surgeon: Rosaline Luna, MD;  Location: [facility];  Service: Gynecology;  Laterality:  N/A;   C2-3 nerve ablation Right 06/2019   Dr. Darlean (per pt)   COLONOSCOPY     CYSTOCELE REPAIR  11/24/2015   Procedure: ANTERIOR REPAIR (CYSTOCELE);  Surgeon: Rosaline Luna, MD;  Location: [facility];  Service: Gynecology;;   CYSTOSCOPY N/A 11/24/2015   Procedure: PHYLLIS;  Surgeon: Rosaline Luna, MD;  Location: [facility];  Service: Gynecology;  Laterality: N/A;   DILATION AND CURETTAGE OF UTERUS     ENDOMETRIAL ABLATION  08/2000   EYE SURGERY Bilateral    Lasik    KIDNEY STONE SURGERY  2011   LITHOTRIPSY  08/2017 (L), 03/2018 (R)   MELANOMA EXCISION  05/2015   right forearm; Dr. Robinson   NECK SURGERY  [name]  Summer 2011   X 4  (C4-T1 Level)   RHINOPLASTY     ROBOTIC ASSISTED BILATERAL SALPINGO OOPHERECTOMY Bilateral 11/24/2015   Procedure: ROBOTIC ASSISTED BILATERAL SALPINGO OOPHORECTOMY;  Surgeon: Rosaline Luna, MD;  Location: [facility];  Service: Gynecology;  Laterality: Bilateral;--OVARIES NOT REMOVED PER PATH REPORT   ROBOTIC ASSISTED TOTAL HYSTERECTOMY WITH SALPINGECTOMY Bilateral 11/24/2015   Procedure: ROBOTIC ASSISTED TOTAL HYSTERECTOMY WITH SALPINGECTOMY;  Surgeon: Rosaline Luna, MD;  Location: [facility];  Service: Gynecology;  Laterality: Bilateral;   TONSILLECTOMY     TOTAL VAGINAL HYSTERECTOMY  WISDOM TOOTH EXTRACTION      SOCIAL HISTORY Social History   Tobacco Use   Smoking status: Former    Current packs/day: 0.00    Average packs/day: 0.1 packs/day for 3.0 years (0.3 ttl pk-yrs)    Types: Cigarettes    Start date: 07/03/1984    Quit date: 07/04/1987    Years since quitting: 36.9   Smokeless tobacco: Never  Vaping Use   Vaping status: Never Used  Substance Use Topics   Alcohol use: Not Currently    Comment: typically 2/week, up to 4/week   Drug use: No    Social History   Social History Narrative   Married.  Lives with husband, 1 dog   1 daughter got her Masters in [place], lives in [place], married.   Son moved out 07/2018, local, 1  granddaughter from him (who lives with the mother). Son and granddaughter stay with them every other weekend.   1 daughter in [place], [place] , got married 2021 (husband is from [place]).   brother-in-law (with Down's syndrome) , had a stroke, in SNF, passed away in September 28, 2019   Disabled due to anxiety.  Previously worked as psychologist, forensic.   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   Caffeine - coffee I cup in am/    SOCIAL DRIVERS OF HEALTH SDOH Screenings   Food Insecurity: No Food Insecurity (02/03/2024)  Housing: Low Risk  (02/03/2024)  Transportation Needs: No Transportation Needs (02/03/2024)  Alcohol Screen: Low Risk  (02/03/2024)  Depression (PHQ2-9): Low Risk  (05/23/2023)  Financial Resource Strain: Low Risk  (02/03/2024)  Physical Activity: Unknown (02/03/2024)  Social Connections: Socially Integrated (02/03/2024)  Stress: Patient Declined (02/03/2024)  Tobacco Use: Medium Risk (02/04/2024)     FAMILY HISTORY Family History  Problem Relation Age of Onset   Hypertension Mother    Depression Mother    Migraines Mother    Hypothyroidism Mother    Hypercholesterolemia Mother    Anxiety disorder Mother    Arthritis Mother    COPD Father    Heart failure Father 2   Arthritis Father    Heart disease Father    Healthy Brother    Cancer Maternal Grandmother    Depression Maternal Grandmother    Heart disease Maternal Grandmother    Cancer Maternal Grandfather    Cardiomyopathy Daughter 3       related to COVID (illness or vaccine?)   Healthy Daughter    Healthy Son    Cirrhosis Cousin        due to NAFLD (nonalcoholic)   Diabetes Neg Hx    BRCA 1/2 Neg Hx    Breast cancer Neg Hx      ALLERGIES: is allergic to penicillins, codeine, voltaren  [diclofenac ], zetia  [ezetimibe ], crestor  Gabbie.galli ], diclofenac  sodium, pravastatin , vytorin   [ezetimibe -simvastatin ], and erythromycin.  MEDICATIONS  Current Outpatient Medications  Medication Sig Dispense Refill   buPROPion  (WELLBUTRIN  XL) 150 MG 24 hr tablet Take by mouth.     carvedilol  (COREG ) 3.125 MG tablet Take 1 tablet (3.125 mg total) by mouth 2 (two) times daily with a meal. 180 tablet 2   empagliflozin  (JARDIANCE ) 10 MG TABS tablet TAKE 1 TABLET BY MOUTH DAILY BEFORE BREAKFAST 90 tablet 3   EPINEPHrine  0.3 mg/0.3 mL IJ SOAJ injection Inject 0.3 mg into the muscle as needed for  anaphylaxis. (Patient not taking: Reported on 02/04/2024) 1 each 0   FLUoxetine  (PROZAC ) 40 MG capsule Take 2 capsules by mouth daily.     LORazepam  (ATIVAN ) 0.5 MG tablet Take 1-2 tablets (0.5-1 mg total) by mouth 3 (three) times daily as needed for anxiety. Prn anxiety 30 tablet 0   Multiple Vitamin (MULTI-VITAMIN PO) Take 1 tablet by mouth daily.     NON FORMULARY Take 1 tablet by mouth daily. Magnesium - Calcium  - Potassium - Vitamin D      sacubitril -valsartan  (ENTRESTO ) 49-51 MG Take 1 tablet by mouth 2 (two) times daily. 180 tablet 3   tiZANidine  (ZANAFLEX ) 2 MG tablet Take 1 tablet (2 mg total) by mouth every 6 (six) hours as needed for muscle spasms. 30 tablet 11   No current facility-administered medications for this visit.    PHYSICAL EXAMINATION TELEPHONE VISIT:  LABORATORY DATA:   I have reviewed the data as listed     Latest Ref Rng & Units 05/14/2024    7:51 AM 11/12/2023    8:05 AM 04/30/2023    8:01 AM  CBC EXTENDED  WBC 4.0 - 10.5 K/uL 3.4  3.6  3.7   RBC 3.87 - 5.11 MIL/uL 3.62  4.10  3.70   Hemoglobin 12.0 - 15.0 g/dL 87.5  86.0  86.9   HCT 36.0 - 46.0 % 36.6  40.6  37.2   Platelets 150 - 400 K/uL 201  228  249   NEUT# 1.7 - 7.7 K/uL 1.1  1.4  1.3   Lymph# 0.7 - 4.0 K/uL 1.9  1.9  2.0        Latest Ref Rng & Units 05/14/2024    7:51 AM 02/04/2024    9:39 AM 11/12/2023    8:05 AM  CMP  Glucose 70 - 99 mg/dL 896  97  96   BUN 6 - 20 mg/dL 15  14  13    Creatinine  0.44 - 1.00 mg/dL 9.13  9.18  9.12   Sodium 135 - 145 mmol/L 136  134  134   Potassium 3.5 - 5.1 mmol/L 4.3  4.2  4.1   Chloride 98 - 111 mmol/L 103  100  102   CO2 22 - 32 mmol/L 27  19  28    Calcium  8.9 - 10.3 mg/dL 9.9  9.4  89.8   Total Protein 6.5 - 8.1 g/dL 7.9  7.4  8.2   Total Bilirubin 0.0 - 1.2 mg/dL 0.6  0.6  0.5   Alkaline Phos 38 - 126 U/L 57  70  69   AST 15 - 41 U/L 52  71  56   ALT 0 - 44 U/L 69  75  73    MULTIPLE MYELOMA 04/2022 - 05/2024    RADIOGRAPHIC STUDIES: I have personally reviewed the radiological images as listed and agreed with the findings in the report. No results found.   ASSESSMENT & PLAN:  58 y.o. female with  58 y.o. 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  Previous UPEP and skeletal survey negative. Patient has no evidence of anemia, renal failure on labs. No hypercalcemia. No new bone pain. SPEP shows stable M-spike at 1g/dl with no significant increase over the last couple of follow-ups. SFLC ratio within normal limits -12/21/18 MRI Cervical spine which did not reveal any overt indication/concern for bone tumors. Continue follow up with  Orthopedics and neurology.  2) Systolic nonischemic CHF patient previously noted improvement in her EF from 33% to 45%. She subsequently noted her EF normalized to 55% as of 06/19/18 clinic visit.  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  4) Peripheral radiculopathy /neuropathy -Is currently followed by Unice of Neurosurgery as well as Dr Skeet of Neurology who have begun to work this up further. -electromyography showed evidence of radiculopathy, mild in degree, and without evidence of sensorimotor polyneuropathy.    Cannot r/o small fiber neuropathy -  Evidence of this on epidermal nerve fiber density analysis.  PLAN: - Discussed lab results on 05/14/2024 in detail with patient: CBC showed WBC of 3.4K decreased from 3.6K, Hemoglobin of 12.4, and PLTs of 201K. CMP with AST 52 decreased from 71 and ALT 69 decreased from 75.   Hepatic steatosis noted on 02/07/2024 Limited Abdominal US . M protein stable at 1.1 Patient has no evidence of progression of her plasma cell dyscrasia to active myeloma at this time.  FOLLOW-UP  Phone visit with Dr Onesimo in 6 months Labs 7-10 days prior to phone visit  The total time spent in the appointment was 20 minutes* .  All of the patient's questions were answered and the patient knows to call the clinic with any problems, questions, or concerns.  Emaline Onesimo MD MS AAHIVMS [facility] Hematology/Oncology Physician [facility]  *Total Encounter Time as defined by the Centers for Medicare and Medicaid Services includes, in addition to the face-to-face time of a patient visit (documented in the note above) non-face-to-face time: obtaining and reviewing outside history, ordering and reviewing medications, tests or procedures, care coordination (communications with other health care professionals or caregivers) and documentation in the medical record.  I,Emily Lagle,acting as a neurosurgeon for Emaline Onesimo, MD.,have documented all relevant documentation on the behalf of Emaline Onesimo, MD,as directed by  Emaline Onesimo, MD while in the presence of Emaline Onesimo, MD.  I have reviewed the above documentation for accuracy and completeness, and I agree with the above.  Jochebed Bills, MD

## 2024-05-28 ENCOUNTER — Encounter: Payer: Self-pay | Admitting: *Deleted

## 2024-05-28 ENCOUNTER — Inpatient Hospital Stay: Admitting: Hematology

## 2024-05-28 DIAGNOSIS — D472 Monoclonal gammopathy: Secondary | ICD-10-CM

## 2024-05-28 NOTE — Progress Notes (Unsigned)
 No chief complaint on file.   Lindsey Rodriguez is a 58 y.o. female who presents for annual physical exam, Medicare wellness visit (see separate note) and follow-up on chronic medical conditions.    She was last seen by in August, at the request of Dr. Onesimo to f/u on elevated LFT's. They remained elevated, so US  was performed, showing hepatic steatosis.   She was started on omega-3 fish oil, as she has mixed hyperlipidemia, and last TG in 09/2023 had been 372.  ***TAKING FISH OIL?  LFTs remained mildly elevated on last check, 05/14/24  Component Ref Range & Units (hover) 2 wk ago (05/14/24) 3 mo ago (02/04/24) 6 mo ago (11/12/23) 8 mo ago (10/01/23) 1 yr ago (05/23/23) 1 yr ago (04/30/23) 1 yr ago (10/23/22)  Sodium 136 134 R 134 Low  138 R  137 138  Potassium 4.3 4.2 R 4.1 4.3 R  4.0 4.1  Chloride 103 100 R 102 102 R  105 104  CO2 27 19 Low  R 28 25 R  27 27  Comment: (NOTE) Elevated LDH levels may cause falsely increased CO2 results. If LDH is >2000 U/L, a positive bias of 12% is possible.   Glucose, Bld 103 High  97 96 CM 109 High  99 118 High  CM 91 CM  Comment: Glucose reference range applies only to samples taken after fasting for at least 8 hours.  BUN 15 14 R 13 11 R  11 12  Creatinine 0.86 0.81 R 0.87 0.73 R  0.85 0.81  Calcium  9.9 9.4 R 10.1 9.5 R  9.5 10.3  Total Protein 7.9 7.4 R 8.2 High    7.4 8.1  Albumin  4.6 4.7 R 4.9   4.5 4.9  AST 52 High  71 High  R 56 High    30 40  ALT 69 High  75 High  R 73 High    39 49 High   Alkaline Phosphatase 57 70 R 69   69 72  Total Bilirubin 0.6 0.6 0.5   0.4 R 0.4 R  GFR, Estimated >60  >60 CM   >60 CM >60 CM  Comment: (NOTE) Calculated using the CKD-EPI Creatinine Equation (2021)  Anion gap 6  4 Low  CM   5 CM 7     Fibrosis 4 Score = 1.81 (Indeterminate)       Interpretation for patients with NAFLD          <1.30       -  F0-F1 (Low risk)          1.30-2.67 -  Indeterminate           >2.67      -  F3-F4 (High risk)      Validated for ages 79-65        H/o hypertension, chronic systolic heart failure, non-ischemic cardiomyopathy--under the care of Dr. Ladona, last seen in 07/2023.  Heart failure has been stable on regimen of Jardiance , Entresto  and carvedilol .  Last echo was 05/2023, EF 35-40%, global hypokinesis of L ventricle, mildly dilated L ventricular internal cavity. She is scheduled for repeat echo in 07/2024.  She reports fatigue, slight shortness of breath with exertion. ***UPDATE  Has some chest tightness, occurs at rest, which she relates to anxiety. Not exertional. +work stress.    BP Readings from Last 3 Encounters:  02/04/24 130/80  11/21/23 125/75  07/31/23 138/82    Hyperlipidemia: This has been managed by cardiologist. She was unable  to tolerate Vytorin , lipitor and Crestor  in the past (myalgias).  She had been tolerating pravastatin  and zetia  at one point, stopped these late 03/2023 (?headaches).  She had lipids checked by cardiologist in March, who was going to see if they could get Repatha covered (didn't have CAD), and also suggested trial of Nexletol  (samples given 10/2023?*** EVER TRIED? TAKING ANY LIPID MEDS?  She continues to eat red meat 3 times/week, uses ground turkey a lot. Some burgers. Butter only when baking. Uses coconut oil. 3-4 eggs/week.  ***UPDATE DIET  Component Ref Range & Units (hover) 8 mo ago (10/01/23) 2 yr ago (04/26/22) 2 yr ago (10/25/21) 2 yr ago (09/07/21) 3 yr ago (05/18/21) 4 yr ago (04/05/20) 4 yr ago (02/11/20)  Cholesterol, Total 254 High  241 High  166 265 High  238 High  205 High  201 High   Triglycerides 372 High  237 High  200 High  247 High  226 High  125 194 High   HDL 35 Low  39 Low  46 48 38 Low  64 61  VLDL Cholesterol Cal 69 High  44 High  34 46 High  42 High  22 33  LDL Chol Calc (NIH) 150 High  158 High  86 171 High  158 High  119 High  107 High   LDL/HDL Ratio 4.3 High   1.9 CM 3.6 High  CM 4.2 High  CM  1.8 CM    Vitamin D  deficiency:   Level was low at 23.8 last year, when not taking any supplements.  Levels had been normal while taking Mg-Ca-D supplement. She is currently taking *** MVI D??  Component Ref Range & Units (hover) 1 yr ago (05/23/23) 4 yr ago (04/05/20) 4 yr ago (06/23/19) 8 yr ago (02/23/16) 10 yr ago (02/19/14) 12 yr ago (03/06/12) 12 yr ago (10/11/11)  Vit D, 25-Hydroxy 23.8 Low  47.6 CM 24.24 Low  R, CM 35 R, CM 43 R, CM 31 R, CM 32 R, CM   Osteopenia: T-1.4 at L forearm/radius in 06/2019.  Pre-diabetes:  A1c was elevated at 6.0% last year, and has had some mildly elevated fasting glucoses. Due for recheck.  ***DIET  Chronic back pain:  She had L2-3 lateral interbody (Left side approach), L2-3 Posterior Spinal Fusion in January 2022 through [facility] (Dr. Melonie). She had CT myelograms of thoracic and lumbar spine in June 2022.  Last seen at [facility] 01/2021. She no longer sees neurosurgeon.  She still has some pain down her left leg. She has neuropathy in both feet.  Ice and heat help some. Advil  helps if the pain is really bad. Recalls being on cymbalta in the past (but no details about if it helped, why changed). Didn't tolerate lyrica  and gabapentin  due to sedation.  She previously saw Dr. Dolphus for chondrocalcinosis, OA, DJD, and fibromyalgia.  She wasn't on medications from their office, no longer follows there. She saw Dr. Leni at [facility] in July 2024 (referred by Dr. Joane), due to inflammatory arthritis, positive HLA-B27, and rt knee and rt foot pain. Patient reports they told her it didn't progress to a diagnosis of Ankylosing spondylitis, but is trending that direction. Xrays of thoracic, and L-S spine and SI joints were ordered. (No results in our system). No treatment recommendations were made.  No f/u scheduled, as she states there wasn't anything they can do.  ***UPDATE   She sees Dr. Skeet for her headaches, last seen in 11/2023. She has migraines  without aura, occipital  neuralgia/cervicogenic headaches, and tension-type HA's. Also has idiopathic small fiber neuropathy.  She had reported that her neuropathy was worse (and more severe when tired), being more severe in toes (burning, stinging, numbness), with tingling up to above ankles.  Felt a little unsteady sometimes, no falls.  He recommended carbamazepine for the neuropathy, she declined.  Suggested walking, exercise, yoga, to help with balance. He has previously suggested referral to pain management (for neuropathy), and PT if worsening balance. She has f/u scheduled with him for 10/2024. She continues on tizanidine  prn headaches.  Last year--uses Tizanidine  for HA maybe once a month.  She gets migraines every other month month. Ice, tizanidine  and sleep resolve her headaches.   ***UPDATE    She continues to see Dr. Vincente for depression and anxiety. Currently is on 80mg  of prozac  and Wellbutrin  XL 150mg  daily.  She has lorazepam  for prn use. She reports she wasn't successful in trying to find a therapist that clicks.  She is walking, which helps with her stress.  Stress is mainly related to work. 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  ***UPDATE   She previously got testosterone  pellets from [facility]. She stopped last year, not seeing improvement in her energy.  She continues to have a normal libido.  Only occasionally has vaginal dryness, coconut oil helps. She denies night sweats or hot flashes.  Cholelithiasis was noted on 03/2021 US  done for flank pain.  She had normal CBD, poss hepatic steatosis.  Bilateral nephrolithiasis was noted on CT scan.  No gallstones were noted on US  02/2024. She denies any flank pain, hematuria, RUQ pain.    Immunization History  Administered Date(s) Administered   Hepatitis B 10/31/2008   Influenza Split 03/18/2011, 04/02/2014, 05/01/2014   Influenza Whole 07/03/2009   Influenza, Seasonal, Injecte, Preservative Fre 06/04/2012    Influenza,inj,Quad PF,6+ Mos 03/10/2013, 04/07/2015, 03/02/2016, 03/08/2017, 03/13/2018, 04/07/2019, 04/05/2020, 04/14/2021   Influenza-Unspecified 05/03/2012, 04/07/2015, 04/21/2020   MMR 07/03/1981   Pneumococcal Conjugate-13 03/08/2017   Tdap 07/03/1981, 09/05/2012   She refuses COVID vaccines.  She previously reported that a family member died from the vaccine, and daughter has heart failure (had vaccines, and COVID) Last Pap smear: per Dr. Sarrah, approx 10/2014; s/p hysterectomy (for benign reasons) Last mammogram: 09/2023 Last colonoscopy: 12/2017, normal (previously had polyp, in 2014); 5 yr f/u rec per Dr. Kristie. She saw Dr. Kristie in June 2024, recommended colonoscopy, pt declined, wanting to wait for 10 year f/u (per Dr. Nola note). Last DEXA: 06/2019, T-1.4 at L wrist Dentist: twice yearly Ophtho: yearly Exercise:    Tries to walk at least 20 minutes between 1-5 times/week. Last year--She had PT/personal trainer 2x/week for strength and cardio August through early October (stopped due to cost).  Occasionally does those exercises at home.  Patient has a living will and medical power of attorney, in chart  PMH, PSH, SH and FH were reviewed and updated.     ROS:  The patient denies anorexia, fever, vision changes, decreased hearing, ear pain, sore throat, breast concerns, chest pain, palpitations, syncope, cough, swelling, nausea, vomiting, diarrhea, constipation, abdominal pain, melena, hematochezia, indigestion/heartburn, hematuria, vaginal bleeding, discharge, odor or itch, genital lesions, tremor, suspicious skin lesions, depression, abnormal bleeding/bruising, or enlarged lymph nodes. Sees dermatologist regularly (h/o melanoma)  Some mild vaginal dryness. Libido is normal. Denies hot flashes or night sweats. Neuropathy in feet--cold, tingly, tolerable. Headaches and back pain per HPI Occasional neck pain--occasionally  takes an advil  and it helps, along with  icing. Depression/anxiety--under the care of psych. +anxiety related to work  Some chest discomfort and DOE per HPI. L medial elbow pain has recurred after PRP *** Some urge incontinence   PHYSICAL EXAM:  LMP 12/16/2012   Wt Readings from Last 3 Encounters:  02/04/24 199 lb 3.2 oz (90.4 kg)  11/21/23 197 lb (89.4 kg)  07/31/23 198 lb 6.4 oz (90 kg)  05/23/2023 201 lb 3.2 oz (91.3 kg) 04/26/2022 193 lb 6.4 oz (87.7 kg) 04/14/2021 177 lb 3.2 oz (80.4 kg) 04/05/2020 169 lb (76.7 kg) 03/19/2019 162 lb 6.4 oz (73.7 kg)  03/13/2018 154 lb 9.6 oz (70.1 kg)  03/08/2017 161 lb 9.6 oz (73.3 kg)   General Appearance:    Alert, cooperative, no distress, appears stated age.  Head:    Normocephalic, without obvious abnormality, atraumatic, nontender  Eyes:    PERRL, conjunctiva/corneas clear, EOM's intact, fundi benign  Ears:    Normal TM's and external ear canals  Nose:   No drainage, no sinus tenderness  Throat:   Normal mucosa  Neck:   Supple, no lymphadenopathy;  thyroid :  no enlargement/ tenderness/nodules; no carotid bruit or JVD. WHSS anteriorly, horizontal, and vertical posterior WHSS.   Back:   No spinal tenderness or spasm, no trigger points in upper back.  SI joints nontender  Lungs:     Clear to auscultation bilaterally without wheezes, rales or ronchi; respirations unlabored  Chest Wall:    No tenderness or deformity   Heart:    regular rate and rhythm, no murmur, rub or gallop   Breast Exam:    no nipple inversion, discharge, skin dimpling, breast masses or axillary lymphadenopathy.  Abdomen:     Soft, non-tender, nondistended, normoactive bowel sounds, no masses, no hepatosplenomegaly  Genitalia:    normal external genitalia without lesions.  Normal bimanual exam.  Uterus surgically absent. No adnexal masses or tenderness  Rectal:   Normal sphincter tone, no mass. Heme negative stool  Extremities:   No clubbing, cyanosis or edema.Tender at L medial epicondyle.   Pulses:   2+ and  symmetric all extremities  Skin:   Skin color, texture, turgor normal, lesions. No rashes. Yellow ecchymosis at L outer breast. No other significant bruising.   Lymph nodes:   Cervical, supraclavicular, inguinal and axillary nodes normal  Neurologic:   Normal strength, sensation and gait; reflexes 1+ and symmetric              Psych:   Normal mood, affect, hygiene and grooming.    ASSESSMENT/PLAN:  Vitamin D , fasting glu, A1c (can be with labs). Lipids (?)--usually done by [facility], has no f/u and ?if ever started on treatment. Check today  Did she ever try other lipid meds?  Nexletol   samples?  Repatha? Taking fish oil? Taking any lipid meds currently? Dr. Ladona, has no f/u with him (just echo in Jan) Fatty liver and elevated TG, risks--DISCUSS ***  Prevnar-20, flu Decline COVID To get Tdap and Shingrix from pharmacy (check to see if she had either of these from pharmacy--she was reminded last year) Order DEXA to f/u ostepenia (5 yr f/u)   Pre-diabetes, lipids, diet DISCUSS ***  Discussed monthly self breast exams and yearly mammograms; at least 30 minutes of aerobic activity at least 5 days/week, weight-bearing exercise at least 2x/wk; proper sunscreen use reviewed; healthy diet, including goals of calcium  and vitamin D  intake and alcohol recommendations (less than or equal to 1 drink/day)  reviewed; regular seatbelt use; changing batteries in smoke detectors.   Immunization recommendations discussed--yearly flu shots recommended. *** COVID vaccine declined. TdaP past due, reminded her to get from pharmacy. *** Shingrix recommended, needs to get from pharmacy [facility]).  Risks/SE reviewed. Prevnar-20 *** Colonoscopy recommendations reviewed, UTD, was due again 01/2023. Patient saw Dr. Kristie 12/2022, declined the recommended 5 yr f/u colonoscopy, wanting to wait for 10 years instead. Still will not consider sooner repeat colonoscopy. *** DEXA recommended (5 year follow-up)  Confirmed that  patient is full code, full care    F/u 1 year, sooner prn.    Medicare Attestation I have personally reviewed: The patient's medical and social history Their use of alcohol, tobacco or illicit drugs Their current medications and supplements The patient's functional ability including ADLs,fall risks, home safety risks, cognitive, and hearing and visual impairment Diet and physical activities Evidence for depression or mood disorders  The patient's weight, height, BMI have been recorded in the chart.  I have made referrals, counseling, and provided education to the patient based on review of the above and I have provided the patient with a written personalized care plan for preventive services.

## 2024-06-01 DIAGNOSIS — M85832 Other specified disorders of bone density and structure, left forearm: Secondary | ICD-10-CM | POA: Insufficient documentation

## 2024-06-01 NOTE — Progress Notes (Unsigned)
 No chief complaint on file.    Subjective:   Lindsey Rodriguez is a 58 y.o. female who presents for a Medicare Annual Wellness Visit.  Allergies (verified) Penicillins, Codeine, Voltaren  [diclofenac ], Zetia  [ezetimibe ], Crestor  [rosuvastatin ], Diclofenac  sodium, Pravastatin , Vytorin  [ezetimibe -simvastatin ], and Erythromycin   History: Past Medical History:  Diagnosis Date   Anemia    Anxiety panic attacks   Dr. Vincente   Arthritis    knees   Cardiomyopathy, nonischemic (HCC) 04/07/2016   EF left ventricule calculated on Lexiscan  stress test at 33% with global hypokinesis.    Chronic back pain    Chronic neck pain    COVID-19 06/2021   treated with Paxlovid through [facility] in [place]   Depression    Dysrhythmia    pvc's with cardiomyopathy   Family history of adverse reaction to anesthesia    mother has problems waking up   Fibromyalgia    and neuropathy of feet   History of kidney stones    lithotripsy  03/2016   Hyperlipidemia    Hypertension    Kidney stone    History   Kidney stones h/o   Melanoma (HCC) RUE   Dr. Robinson   MGUS (monoclonal gammopathy of unknown significance) 06/2015   no treatment just watching recheck blood work 12/2015   Migraine    OTC med prn - stress related   Plantar fasciitis right   Renal stones 2008/2011/2017   Seasonal allergies    SVD (spontaneous vaginal delivery)    x 3   Tendonitis of foot right   Past Surgical History:  Procedure Laterality Date   ABLATION ON ENDOMETRIOSIS N/A 11/24/2015   Procedure: ABLATION ON ENDOMETRIOSIS;  Surgeon: Rosaline Luna, MD;  Location: [facility];  Service: Gynecology;  Laterality: N/A;   ANTERIOR LAT LUMBAR FUSION Right 06/15/2016   Procedure: RIGHT LUMBAR THREE-FOUR ANTEROLATERAL LUMBAR INTERBODY FUSION WITH LATERAL PLATE;  Surgeon: Fairy Levels, MD;  Location: [facility] OR;  Service: Neurosurgery;  Laterality: Right;   BACK SURGERY  Dr.Stern, last sx Summer 2011   X 4  (L3-S1)   BACK SURGERY   06/2016   BACK SURGERY  07/20/2020   L2-3 lateral interbody (Left side approach), L2-3 Posterior Spinal Fusion at [facility]   BLADDER SUSPENSION N/A 11/24/2015   Procedure: TRANSVAGINAL TAPE (TVT) PROCEDURE;  Surgeon: Rosaline Luna, MD;  Location: [facility];  Service: Gynecology;  Laterality: N/A;   C2-3 nerve ablation Right 06/2019   Dr. Darlean (per pt)   COLONOSCOPY     CYSTOCELE REPAIR  11/24/2015   Procedure: ANTERIOR REPAIR (CYSTOCELE);  Surgeon: Rosaline Luna, MD;  Location: [facility];  Service: Gynecology;;   CYSTOSCOPY N/A 11/24/2015   Procedure: PHYLLIS;  Surgeon: Rosaline Luna, MD;  Location: [facility];  Service: Gynecology;  Laterality: N/A;   DILATION AND CURETTAGE OF UTERUS     ENDOMETRIAL ABLATION  08/2000   EYE SURGERY Bilateral    Lasik    KIDNEY STONE SURGERY  2011   LITHOTRIPSY  08/2017 (L), 03/2018 (R)   MELANOMA EXCISION  05/2015   right forearm; Dr. Robinson   NECK SURGERY  [name]  Summer 2011   X 4  (C4-T1 Level)   RHINOPLASTY     ROBOTIC ASSISTED BILATERAL SALPINGO OOPHERECTOMY Bilateral 11/24/2015   Procedure: ROBOTIC ASSISTED BILATERAL SALPINGO OOPHORECTOMY;  Surgeon: Rosaline Luna, MD;  Location: [facility];  Service: Gynecology;  Laterality: Bilateral;--OVARIES NOT REMOVED PER PATH REPORT   ROBOTIC ASSISTED TOTAL HYSTERECTOMY WITH SALPINGECTOMY Bilateral 11/24/2015   Procedure:  ROBOTIC ASSISTED TOTAL HYSTERECTOMY WITH SALPINGECTOMY;  Surgeon: Rosaline Luna, MD;  Location: [facility];  Service: Gynecology;  Laterality: Bilateral;   TONSILLECTOMY     TOTAL VAGINAL HYSTERECTOMY     WISDOM TOOTH EXTRACTION     Family History  Problem Relation Age of Onset   Hypertension Mother    Depression Mother    Migraines Mother    Hypothyroidism Mother    Hypercholesterolemia Mother    Anxiety disorder Mother    Arthritis Mother    COPD Father    Heart failure Father 25   Arthritis Father    Heart disease Father    Healthy Brother    Cancer Maternal Grandmother     Depression Maternal Grandmother    Heart disease Maternal Grandmother    Cancer Maternal Grandfather    Cardiomyopathy Daughter 46       related to COVID (illness or vaccine?)   Healthy Daughter    Healthy Son    Cirrhosis Cousin        due to NAFLD (nonalcoholic)   Diabetes Neg Hx    BRCA 1/2 Neg Hx    Breast cancer Neg Hx    Social History   Occupational History   Not on file  Tobacco Use   Smoking status: Former    Current packs/day: 0.00    Average packs/day: 0.1 packs/day for 3.0 years (0.3 ttl pk-yrs)    Types: Cigarettes    Start date: 07/03/1984    Quit date: 07/04/1987    Years since quitting: 36.9   Smokeless tobacco: Never  Vaping Use   Vaping status: Never Used  Substance and Sexual Activity   Alcohol use: Not Currently    Comment: typically 2/week, up to 4/week   Drug use: No   Sexual activity: Yes    Partners: Male    Birth control/protection: Surgical    Comment: husband had vasectomy; pt had hysterectomy   Tobacco Counseling Counseling given: Not Answered  SDOH Screenings   Food Insecurity: No Food Insecurity (05/28/2024)  Housing: Low Risk  (05/28/2024)  Transportation Needs: No Transportation Needs (05/28/2024)  Alcohol Screen: Low Risk  (05/28/2024)  Depression (PHQ2-9): Low Risk  (05/23/2023)  Financial Resource Strain: Low Risk  (05/28/2024)  Physical Activity: Insufficiently Active (05/28/2024)  Social Connections: Socially Integrated (05/28/2024)  Stress: Stress Concern Present (05/28/2024)  Tobacco Use: Medium Risk (02/04/2024)   See flowsheets for full screening details  Depression Screen     Goals Addressed   None    Fall Screening Falls in the past year?: 0 Number of falls in past year: 0 Was there an injury with Fall?: 0 Fall Risk Category Calculator: 0 Patient Fall Risk Level: Low Fall Risk  Fall Risk Fall risk Follow up: Falls evaluation completed  Advance Directives (For Healthcare) Does Patient Have a Medical Advance  Directive?: No        Objective:    There were no vitals filed for this visit. There is no height or weight on file to calculate BMI.  Current Medications (verified) Outpatient Encounter Medications as of 06/02/2024  Medication Sig   buPROPion  (WELLBUTRIN  XL) 150 MG 24 hr tablet Take by mouth.   carvedilol  (COREG ) 3.125 MG tablet Take 1 tablet (3.125 mg total) by mouth 2 (two) times daily with a meal.   empagliflozin  (JARDIANCE ) 10 MG TABS tablet TAKE 1 TABLET BY MOUTH DAILY BEFORE BREAKFAST   EPINEPHrine  0.3 mg/0.3 mL IJ SOAJ injection Inject 0.3 mg into the muscle as needed  for anaphylaxis. (Patient not taking: Reported on 02/04/2024)   FLUoxetine  (PROZAC ) 40 MG capsule Take 2 capsules by mouth daily.   LORazepam  (ATIVAN ) 0.5 MG tablet Take 1-2 tablets (0.5-1 mg total) by mouth 3 (three) times daily as needed for anxiety. Prn anxiety   Multiple Vitamin (MULTI-VITAMIN PO) Take 1 tablet by mouth daily.   NON FORMULARY Take 1 tablet by mouth daily. Magnesium - Calcium  - Potassium - Vitamin D    sacubitril -valsartan  (ENTRESTO ) 49-51 MG Take 1 tablet by mouth 2 (two) times daily.   tiZANidine  (ZANAFLEX ) 2 MG tablet Take 1 tablet (2 mg total) by mouth every 6 (six) hours as needed for muscle spasms.   No facility-administered encounter medications on file as of 06/02/2024.   Hearing/Vision screen No results found. Immunizations and Health Maintenance Health Maintenance  Topic Date Due   COVID-19 Vaccine (1) Never done   Zoster Vaccines- Shingrix (1 of 2) Never done   Hepatitis B Vaccines 19-59 Average Risk (2 of 3 - 19+ 3-dose series) 11/28/2008   Pneumococcal Vaccine: 50+ Years (2 of 2 - PPSV23, PCV20, or PCV21) 05/03/2017   DTaP/Tdap/Td (3 - Td or Tdap) 09/06/2022   Colonoscopy  01/22/2023   Influenza Vaccine  02/01/2024   Medicare Annual Wellness (AWV)  05/22/2024   Mammogram  08/23/2024   Hepatitis C Screening  Completed   HIV Screening  Completed   HPV VACCINES  Aged Out    Meningococcal B Vaccine  Aged Out        Assessment/Plan:  This is a routine wellness examination for [place].  Patient Care Team: Randol Dawes, MD as PCP - General (Family Medicine) Ladona Heinz, MD as PCP - Cardiology (Cardiology) Skeet Juliene SAUNDERS, DO as Consulting Physician (Neurology) Kristie Lamprey, MD as Consulting Physician (Gastroenterology) Ladona Heinz, MD as Consulting Physician (Cardiology) Onesimo Emaline Brink, MD as Consulting Physician (Hematology) Gust Royden ORN, MD (Orthopedic Surgery) Darlean Ned, MD (Rehabilitation) Unice Pac, MD as Consulting Physician (Neurosurgery) Melonie Grass, MD as Consulting Physician (Neurosurgery) Hughie Sharper, MD (Family Medicine) Vernetta Lonni GRADE, MD as Consulting Physician (Orthopedic Surgery) Robinson Pao, MD as Consulting Physician (Dermatology) Puschinsky, Charlie ORN., MD (General Surgery) Vincente Grip, MD as Consulting Physician (Psychiatry) Lynell Anes, OD (Optometry) Trudy No, DMD (Dentistry) Joane Artist RAMAN, MD as Consulting Physician (Family Medicine)  I have personally reviewed and noted the following in the patient's chart:   Medical and social history Use of alcohol, tobacco or illicit drugs  Current medications and supplements including opioid prescriptions. Functional ability and status Nutritional status Physical activity Advanced directives List of other physicians Hospitalizations, surgeries, and ER visits in previous 12 months Vitals Screenings to include cognitive, depression, and falls Referrals and appointments  No orders of the defined types were placed in this encounter.  In addition, I have reviewed and discussed with patient certain preventive protocols, quality metrics, and best practice recommendations. A written personalized care plan for preventive services as well as general preventive health recommendations were provided to patient.   Dawes DELENA Randol, MD   06/01/2024   No  follow-ups on file.  After Visit Summary: (In Person-Printed) AVS printed and given to the patient

## 2024-06-01 NOTE — Patient Instructions (Signed)
  HEALTH MAINTENANCE RECOMMENDATIONS:  It is recommended that you get at least 30 minutes of aerobic exercise at least 5 days/week (for weight loss, you may need as much as 60-90 minutes). This can be any activity that gets your heart rate up. This can be divided in 10-15 minute intervals if needed, but try and build up your endurance at least once a week.  Weight bearing exercise is also recommended twice weekly.  Eat a healthy diet with lots of vegetables, fruits and fiber.  Colorful foods have a lot of vitamins (ie green vegetables, tomatoes, red peppers, etc).  Limit sweet tea, regular sodas and alcoholic beverages, all of which has a lot of calories and sugar.  Up to 1 alcoholic drink daily may be beneficial for women (unless trying to lose weight, watch sugars).  Drink a lot of water .  Calcium  recommendations are 1200-1500 mg daily (1500 mg for postmenopausal women or women without ovaries), and vitamin D  1000 IU daily.  This should be obtained from diet and/or supplements (vitamins), and calcium  should not be taken all at once, but in divided doses.  Monthly self breast exams and yearly mammograms for women over the age of 25 is recommended.  Sunscreen of at least SPF 30 should be used on all sun-exposed parts of the skin when outside between the hours of 10 am and 4 pm (not just when at beach or pool, but even with exercise, golf, tennis, and yard work!)  Use a sunscreen that says broad spectrum so it covers both UVA and UVB rays, and make sure to reapply every 1-2 hours.  Remember to change the batteries in your smoke detectors when changing your clock times in the spring and fall. Carbon monoxide detectors are recommended for your home.  Use your seat belt every time you are in a car, and please drive safely and not be distracted with cell phones and texting while driving.   Ms. Verastegui , Thank you for taking time to come for your Medicare Wellness Visit. I appreciate your ongoing  commitment to your health goals. Please review the following plan we discussed and let me know if I can assist you in the future.   This is a list of the screening recommended for you and due dates:  Health Maintenance  Topic Date Due   COVID-19 Vaccine (1) Never done   Zoster (Shingles) Vaccine (1 of 2) Never done   Hepatitis B Vaccine (2 of 3 - 19+ 3-dose series) 11/28/2008   Pneumococcal Vaccine for age over 70 (2 of 2 - PPSV23, PCV20, or PCV21) 05/03/2017   DTaP/Tdap/Td vaccine (3 - Td or Tdap) 09/06/2022   Colon Cancer Screening  01/22/2023   Flu Shot  02/01/2024   Medicare Annual Wellness Visit  05/22/2024   Breast Cancer Screening  08/23/2024   Hepatitis C Screening  Completed   HIV Screening  Completed   HPV Vaccine  Aged Out   Meningitis B Vaccine  Aged Out   Please get TdaP (tetanus booster) from the pharmacy. Shingles vaccine is also recommended (series of 2 shots, given 2 months apart--insurance covers you to get this from the pharmacy).  Yearly flu shots are recommended.  Pneumonia vaccine with either Prevnar-20 or Capvaxive-21 is recommended.  We carry the Prevnar-20 in the office, or you can get either from the pharmacy, whichever you prefer. You need one or the other (not both), with no boosters needed.

## 2024-06-02 ENCOUNTER — Encounter: Payer: Self-pay | Admitting: Family Medicine

## 2024-06-02 ENCOUNTER — Ambulatory Visit: Payer: Medicare PPO | Admitting: Family Medicine

## 2024-06-02 VITALS — BP 122/72 | HR 68 | Ht 66.0 in | Wt 192.2 lb

## 2024-06-02 DIAGNOSIS — D472 Monoclonal gammopathy: Secondary | ICD-10-CM

## 2024-06-02 DIAGNOSIS — I428 Other cardiomyopathies: Secondary | ICD-10-CM

## 2024-06-02 DIAGNOSIS — G43709 Chronic migraine without aura, not intractable, without status migrainosus: Secondary | ICD-10-CM | POA: Diagnosis not present

## 2024-06-02 DIAGNOSIS — G8929 Other chronic pain: Secondary | ICD-10-CM

## 2024-06-02 DIAGNOSIS — Z0000 Encounter for general adult medical examination without abnormal findings: Secondary | ICD-10-CM | POA: Diagnosis not present

## 2024-06-02 DIAGNOSIS — E782 Mixed hyperlipidemia: Secondary | ICD-10-CM

## 2024-06-02 DIAGNOSIS — E559 Vitamin D deficiency, unspecified: Secondary | ICD-10-CM

## 2024-06-02 DIAGNOSIS — F419 Anxiety disorder, unspecified: Secondary | ICD-10-CM

## 2024-06-02 DIAGNOSIS — K760 Fatty (change of) liver, not elsewhere classified: Secondary | ICD-10-CM

## 2024-06-02 DIAGNOSIS — M85832 Other specified disorders of bone density and structure, left forearm: Secondary | ICD-10-CM

## 2024-06-02 DIAGNOSIS — R7301 Impaired fasting glucose: Secondary | ICD-10-CM

## 2024-06-02 DIAGNOSIS — M549 Dorsalgia, unspecified: Secondary | ICD-10-CM

## 2024-06-02 DIAGNOSIS — F32A Depression, unspecified: Secondary | ICD-10-CM

## 2024-06-03 ENCOUNTER — Ambulatory Visit: Payer: Self-pay | Admitting: Family Medicine

## 2024-06-03 LAB — LIPID PANEL
Chol/HDL Ratio: 6.7 ratio — ABNORMAL HIGH (ref 0.0–4.4)
Cholesterol, Total: 274 mg/dL — ABNORMAL HIGH (ref 100–199)
HDL: 41 mg/dL (ref >39)
LDL Chol Calc (NIH): 200 mg/dL — ABNORMAL HIGH (ref 0–99)
Triglycerides: 175 mg/dL — ABNORMAL HIGH (ref 0–149)
VLDL Cholesterol Cal: 33 mg/dL (ref 5–40)

## 2024-06-03 LAB — HEMOGLOBIN A1C
Est. average glucose Bld gHb Est-mCnc: 108 mg/dL
Hgb A1c MFr Bld: 5.4 % (ref 4.8–5.6)

## 2024-06-03 LAB — GLUCOSE, RANDOM: Glucose: 94 mg/dL (ref 70–99)

## 2024-06-03 LAB — VITAMIN D 25 HYDROXY (VIT D DEFICIENCY, FRACTURES): Vit D, 25-Hydroxy: 41.2 ng/mL (ref 30.0–100.0)

## 2024-06-03 NOTE — Progress Notes (Signed)
 If she is willing, Repatha which is covered by her insurance would be the best option without significant side effects

## 2024-06-12 ENCOUNTER — Encounter: Payer: Self-pay | Admitting: Cardiology

## 2024-06-13 ENCOUNTER — Encounter: Payer: Self-pay | Admitting: Family Medicine

## 2024-06-23 ENCOUNTER — Other Ambulatory Visit: Payer: Self-pay | Admitting: Cardiology

## 2024-07-07 ENCOUNTER — Ambulatory Visit: Payer: Self-pay

## 2024-07-07 ENCOUNTER — Encounter (HOSPITAL_COMMUNITY): Payer: Self-pay

## 2024-07-07 ENCOUNTER — Other Ambulatory Visit (HOSPITAL_COMMUNITY): Payer: Medicare PPO

## 2024-07-07 VITALS — BP 118/74 | HR 80 | Temp 98.7°F | Wt 197.2 lb

## 2024-07-07 DIAGNOSIS — R051 Acute cough: Secondary | ICD-10-CM

## 2024-07-07 DIAGNOSIS — R058 Other specified cough: Secondary | ICD-10-CM

## 2024-07-07 LAB — POC COVID19/FLU A&B COMBO
Covid Antigen, POC: NEGATIVE
Influenza A Antigen, POC: NEGATIVE
Influenza B Antigen, POC: NEGATIVE

## 2024-07-07 MED ORDER — FLUTICASONE PROPIONATE 50 MCG/ACT NA SUSP: 2.0000 | Freq: Every day | NASAL | 6 refills | Status: AC

## 2024-07-07 MED ORDER — DOXYCYCLINE HYCLATE 100 MG PO TABS: 100.0000 mg | ORAL_TABLET | Freq: Two times a day (BID) | ORAL | 0 refills | Status: AC

## 2024-07-07 NOTE — Telephone Encounter (Signed)
" ° °  FYI Only or Action Required?: FYI only for provider: appointment scheduled on 01.05.26.  Patient was last seen in primary care on 06/02/2024 by Lindsey Dawes, MD.  Called Nurse Triage reporting Cough.  Symptoms began a week ago.  Interventions attempted: Prescription medications: cefdinir.  Symptoms are: gradually worsening.  Triage Disposition: See Physician Within 24 Hours  Patient/caregiver understands and will follow disposition?: Yes   Copied from CRM #[id number]. Topic: Clinical - Red Word Triage >> Jul 07, 2024  7:57 AM Lindsey Rodriguez wrote: Red Word that prompted transfer to Nurse Triage: Pt called in stating she has a low grade fever, worsening coughing with a green film, feels as if her head is about to explode, ribs hurt from coughing, ear glands are swollen from being stopped up. Pt also mentioned that she coughs extremely hard to the point she is unable to do anything. Pt stated that her normal temperature when checking for fever is 97 so anything above that is abnormal to her. Pt denied any other symptoms at this time. Warm transferred to nurse triage. Reason for Disposition  [1] Continuous (nonstop) coughing interferes with work or school AND [2] no improvement using cough treatment per Care Advice  Answer Assessment - Initial Assessment Questions 1. ONSET: When did the cough begin?       One week  2. SEVERITY: How bad is the cough today?      Worse since onset  3. SPUTUM: Describe the color of your sputum (e.g., none, dry cough; clear, white, yellow, green)      green  4. HEMOPTYSIS: Are you coughing up any blood? If Yes, ask: How much? (e.g., flecks, streaks, tablespoons, etc.)     Denies  5. DIFFICULTY BREATHING: Are you having difficulty breathing? If Yes, ask: How bad is it? (e.g., mild, moderate, severe)      Denies  6. FEVER: Do you have a fever? If Yes, ask: What is your temperature, how was it measured, and when did it start?      Pt states  anything above 97.0 F is a fever for her and her temps have been elevated over that lately  7. CARDIAC HISTORY: Do you have any history of heart disease? (e.g., heart attack, congestive heart failure)       Per pt'[name], pt has hx of LBBB and HTN  8. LUNG HISTORY: Do you have any history of lung disease?  (e.g., pulmonary embolus, asthma, emphysema)      Pt does not have lung hx per pt'[name].  9. PE RISK FACTORS: Do you have a history of blood clots? (or: recent major surgery, recent prolonged travel, bedridden)     Pt does not have PE risk factors  10. OTHER SYMPTOMS: Do you have any other symptoms? (e.g., runny nose, wheezing, chest pain)       Sinus congestion, pressure, vomiting  Pt reports cough, earaches, fever Pt is taking Cefdinir prescribed by UC for symptoms. Today is last day of Cef. Pt scheduled for a visit on  01.05.26 for further evaluation. Pt agrees with plan of care, will call back for any worsening symptoms  Protocols used: Cough - Acute Productive-A-AH  "

## 2024-07-07 NOTE — Patient Instructions (Signed)
 Please go get your xrays done at [facility]. You do not need to make an appointment. You can just show up.   Address: [address] [place], [place], [place] [address]

## 2024-07-07 NOTE — Progress Notes (Signed)
 "  Name: Lindsey Rodriguez   Date of Visit: 07/07/2024   Date of last visit with me: Visit date not found   CHIEF COMPLAINT:  Chief Complaint  Patient presents with   Acute Visit    Sinus and chest congestions, coughing up green mucus, bad cough, fever, symptoms started December 17th, went to urgent care on the 30th, gotten worse sense being on antibiotic.        HPI:  Discussed the use of AI scribe software for clinical note transcription with the patient, who gave verbal consent to proceed.  History of Present Illness   Lindsey Rodriguez is a 59 year old female who presents with persistent cough and respiratory symptoms.  She has been experiencing a persistent cough since June 19, 2024. The cough is severe, almost inducing vomiting, and is productive of green phlegm. It is constant throughout the day and is triggered by positional changes and environmental factors such as heat.  She also experiences sinus and chest congestion, along with headaches. Random fevers and chills occur, and she has slight shortness of breath, particularly when coughing.  Despite previous treatments with cefdinir and a five-day course of prednisone  20 mg, there has been no relief. She has also used over-the-counter medications such as Mucinex, Mucinex DM, NyQuil, and DayQuil without improvement. No significant improvement has been noted with these treatments, and she continues to experience significant respiratory symptoms.         OBJECTIVE:       05/23/2023    8:33 AM  Depression screen PHQ 2/9  Decreased Interest 1  Down, Depressed, Hopeless 0  PHQ - 2 Score 1     BP Readings from Last 3 Encounters:  07/07/24 118/74  06/02/24 122/72  02/04/24 130/80    BP 118/74   Pulse 80   Temp 98.7 F (37.1 C)   Wt 197 lb 3.2 oz (89.4 kg)   LMP 12/16/2012   SpO2 99%   BMI 31.83 kg/m    Physical Exam          Physical Exam Constitutional:      Appearance: Normal appearance.  HENT:      Nose: Congestion and rhinorrhea present.     Mouth/Throat:     Pharynx: No posterior oropharyngeal erythema.  Cardiovascular:     Rate and Rhythm: Normal rate and regular rhythm.  Pulmonary:     Effort: Pulmonary effort is normal. No respiratory distress.  Neurological:     General: No focal deficit present.     Mental Status: She is alert and oriented to person, place, and time. Mental status is at baseline.     ASSESSMENT/PLAN:   Assessment & Plan Acute cough  Productive cough    Assessment and Plan    Acute lower respiratory tract infection Persistent cough with green phlegm, sinus and chest congestion, headache, and mild shortness of breath. Previous cefdinir and prednisone  ineffective. Differential includes persistent infection versus postnasal drip. Possible pneumonia considered but unlikely due to lack of response to prior antibiotic coverage. - Start Flonase  nasal spray nightly. - Use humidifier in room. - Maintain hydration with plenty of water . - Ordered chest x-ray at [facility]. - Prescribed doxycycline  for 7 days. - Advised to report if no improvement after 7 days for potential further testing, such as chest CT. - Consider short course of oral inhaler steroid if symptoms persist, pending response to current treatment.         Reford Olliff A. Vita MD  [facility] "

## 2024-07-08 ENCOUNTER — Ambulatory Visit: Payer: Self-pay | Admitting: Family Medicine

## 2024-07-10 ENCOUNTER — Encounter: Payer: Self-pay | Admitting: Family Medicine

## 2024-07-10 ENCOUNTER — Ambulatory Visit: Payer: Self-pay

## 2024-07-10 ENCOUNTER — Ambulatory Visit (INDEPENDENT_AMBULATORY_CARE_PROVIDER_SITE_OTHER): Admitting: Family Medicine

## 2024-07-10 VITALS — BP 124/82 | HR 80 | Temp 98.6°F | Ht 66.0 in | Wt 199.6 lb

## 2024-07-10 DIAGNOSIS — R062 Wheezing: Secondary | ICD-10-CM

## 2024-07-10 DIAGNOSIS — R058 Other specified cough: Secondary | ICD-10-CM

## 2024-07-10 MED ORDER — HYDROCODONE BIT-HOMATROP MBR 5-1.5 MG/5ML PO SOLN: 5.0000 mL | Freq: Three times a day (TID) | ORAL | 0 refills | Status: DC | PRN

## 2024-07-10 MED ORDER — AIRSUPRA 90-80 MCG/ACT IN AERO: 2.0000 | INHALATION_SPRAY | RESPIRATORY_TRACT | 0 refills | Status: AC | PRN

## 2024-07-10 MED ORDER — ALBUTEROL SULFATE (2.5 MG/3ML) 0.083% IN NEBU
2.5000 mg | INHALATION_SOLUTION | Freq: Once | RESPIRATORY_TRACT | Status: AC
  Administered 2024-07-10: 2.5 mg via RESPIRATORY_TRACT

## 2024-07-10 MED ORDER — AEROCHAMBER MV MISC: 2 refills | Status: AC

## 2024-07-10 NOTE — Patient Instructions (Addendum)
" °  Stay well hydrated. Continue the antibiotic, as it does seem to be helping some. Continue the guaifenesin. We are going to prescribe hycodan syrup, which has hydrocodone .  Since you have tolerated oral hydrocodone  (in pain meds), hopefully you will tolerate this medication. Take this mainly at night, to help you sleep. Do not use the DM version of mucinex while taking the cough syrup (just plain guaifenesin).  We have sent a prescription for AirSupra --this contains albuterol  as well as a steroid.  IT works a little better and just as fast as albuterol . Because of the steroid, be sure to rinse your mouth and throat after using it. If this is not covered or affordable and if your albuterol  at home is old or nearing empty, send us  a MyChart message and we can send in a refill of that instead. Using the albuterol  with an aerochamber (spacer) will help it work a little better, and not taste as bad. "

## 2024-07-10 NOTE — Progress Notes (Signed)
 Chief Complaint  Patient presents with   Acute Visit    Pt came in 07/07/24 for cough, lots of congestion, cough contains fleam, some fever and chills, had gone to UC previous week on 07/01/24. Dr.Jha prescribes antibiotics and nasal spray, pt cannot sleep or rest due to how persistent the cough is, cough is now dry and chest is tight now, when pt coughs chest hurts more and is sore, discomfort in upper chest, throat feels swollen, ears are congested as well. Head pressure going in and going out.    Patient presents for evaluation of persistent productive cough.  She got sick on 12/17, was treated in UC on 12/30--prescribed cefdinir and 5 day course of prednisone . She did not improve, so saw Dr. Vita on 1/5.   She has also used over-the-counter medications such as Mucinex, Mucinex DM, NyQuil, and DayQuil without improvement.  He prescribed a 7 day course of doxycycline , and she had an x-ray that day which didn't show any acute process. His note stated to consider short course of oral inhaler or steroid if symptoms persist, pending response to current treatment.   She reports today that she hasn't slept in the last 2 nights; when she has slept, it is in the recliner, getting only 45-60 minutes of sleep before she wakes up and coughs for 30-45 minutes. Since Monday, the phlegm is a lighter green, more white, and not getting up as much. The phlegm is still globs. The cough is less productive. She is still using Mucinex DM 12 hour twice daily. She was prescribed tessalon  by the UC, didn't help. She tried it again this week, still didn't help.  Her chest feels tight. She found her inhaler from last year, and it does seem to help for short-term. Doesn't feel any postnasal drainage, does have some sinus pressure.  Nasal drainage is similar to the phlegm.  She is doing sinus rinses twice daily.  PMH, PSH, SH reviewed  Outpatient Encounter Medications as of 07/10/2024  Medication Sig Note   buPROPion   (WELLBUTRIN  XL) 150 MG 24 hr tablet Take by mouth.    carvedilol  (COREG ) 3.125 MG tablet TAKE 1 TABLET BY MOUTH TWICE DAILY WITH A MEAL    doxycycline  (VIBRA -TABS) 100 MG tablet Take 1 tablet (100 mg total) by mouth 2 (two) times daily for 7 days.    empagliflozin  (JARDIANCE ) 10 MG TABS tablet TAKE 1 TABLET BY MOUTH DAILY BEFORE BREAKFAST    EPINEPHrine  0.3 mg/0.3 mL IJ SOAJ injection Inject 0.3 mg into the muscle as needed for anaphylaxis.    FLUoxetine  (PROZAC ) 40 MG capsule Take 2 capsules by mouth daily.    fluticasone  (FLONASE ) 50 MCG/ACT nasal spray Place 2 sprays into both nostrils daily.    Krill Oil 500 MG CAPS Take 500 mg by mouth in the morning and at bedtime.    loratadine  (CLARITIN ) 10 MG tablet Take 10 mg by mouth daily.    LORazepam  (ATIVAN ) 0.5 MG tablet Take 1-2 tablets (0.5-1 mg total) by mouth 3 (three) times daily as needed for anxiety. Prn anxiety 07/10/2024: As needed, once a month   Multiple Vitamin (MULTI-VITAMIN PO) Take 1 tablet by mouth daily. 07/10/2024: [facility]   NON FORMULARY Take 1 tablet by mouth in the morning and at bedtime. 07/10/2024: Calcium , D3, Mg, K1 and K2   sacubitril -valsartan  (ENTRESTO ) 49-51 MG Take 1 tablet by mouth 2 (two) times daily.    tiZANidine  (ZANAFLEX ) 2 MG tablet Take 1 tablet (2  mg total) by mouth every 6 (six) hours as needed for muscle spasms. 07/10/2024: As needed, maybe once a month   No facility-administered encounter medications on file as of 07/10/2024.   Allergies  Allergen Reactions   Penicillins Shortness Of Breath and Rash    Has patient had a PCN reaction causing immediate rash, facial/tongue/throat swelling, SOB or lightheadedness with hypotension: no Has patient had a PCN reaction causing severe rash involving mucus membranes or skin necrosis: no Has patient had a PCN reaction that required hospitalization no Has patient had a PCN reaction occurring within the last 10 years: no If all of the above answers are  NO, then may proceed with Cephalosporin use.  Other reaction(s): Unknown   Codeine Other (See Comments)    Manic depressive Other reaction(s): Unknown   Voltaren  [Diclofenac ] Nausea And Vomiting and Other (See Comments)    Pt stated tore stomach up   Zetia  [Ezetimibe ] Nausea Only   Crestor  [Rosuvastatin ] Other (See Comments)    Myalgia   Diclofenac  Sodium    Pravastatin  Nausea Only   Vytorin  [Ezetimibe -Simvastatin ] Other (See Comments)    Severe myalgia   Erythromycin Rash    Other reaction(s): Unknown    ROS: feels chilled, no known fever. URI symptoms per HPI. Cough is worse when she feels warm. Denies nausea (only if she swallows the phlegm). No vomiting or diarrhea. No rashes    PHYSICAL EXAM:  BP 124/82   Pulse 80   Temp 98.6 F (37 C) (Tympanic)   Ht 5' 6 (1.676 m)   Wt 199 lb 9.6 oz (90.5 kg)   LMP 12/16/2012   BMI 32.22 kg/m   Spasms of hacky cough, productive of clear and white phlegm during the office visit. Speaking easily an comfortably between coughing spells (only 1-2 during visit). HEENT: conjunctiva and sclera are clear, EOMI. Nasal mucosa is normal, no drainage noted. OP is clear TM's and EAC's normal. Diffusely tender over sinuses x 4 Neck: no lymphadenopathy or mass.  Mildly diffusely tender (muscular, from coughing) Heart: regular rate and rhythm Lungs: fairly clear, occ wheeze noted. After albuterol  nebulizer--much less coughing, pt stated she felt like it helped a lot. Improved air movement was noted on re-evaluation Psych: normal mood, affect, hygiene and grooming Neuro: alert and oriented, cranial nerves grossly intact, normal gait.   ASSESSMENT/PLAN:  Productive cough - Symptoms have improved with doxycycline , complete course.   PND and RAD contributing to cough. Improved with neb. AirSupra ; steroids if not helping - Plan: HYDROcodone  bit-homatropine (HYCODAN) 5-1.5 MG/5ML syrup, albuterol  (PROVENTIL ) (2.5 MG/3ML) 0.083%  nebulizer solution 2.5 mg  Wheezing - pt reports benefit from albuterol , but bad taste.  Encouraged use of spacer.  Reported significant benefit from nebulizer treatment given in office - Plan: Albuterol -Budesonide (AIRSUPRA ) 90-80 MCG/ACT AERO, Spacer/Aero-Holding Chambers (AEROCHAMBER MV) inhaler, albuterol  (PROVENTIL ) (2.5 MG/3ML) 0.083% nebulizer solution 2.5 mg  Discussed AirSupra --if not covered, can refill albuterol . Discussed need for rinsing mouth due to the steroid. Risks and SE of hydrocodone  reviewed in detail.  I spent 30 minutes dedicated to the care of this patient, including pre-visit review of records, face to face time, post-visit ordering of testing and documentation.  Stay well hydrated. Continue the antibiotic, as it does seem to be helping some. Continue the guaifenesin. We are going to prescribe hycodan syrup, which has hydrocodone .  Since you have tolerated oral hydrocodone  (in pain meds), hopefully you will tolerate this medication. Take this mainly at night, to help you sleep. Do  not use the DM version of mucinex while taking the cough syrup (just plain guaifenesin).

## 2024-07-10 NOTE — Telephone Encounter (Signed)
 FYI Only or Action Required?: FYI only for provider: appointment scheduled on 07/10/24.  Patient was last seen in primary care on 07/07/2024 by Vita Morrow, MD.  Called Nurse Triage reporting Cough.  Symptoms began several weeks ago.  Interventions attempted: Prescription medications: Flonase , doxycycline .  Symptoms are: unchanged.  Triage Disposition: See HCP Within 4 Hours (Or PCP Triage)  Patient/caregiver understands and will follow disposition?: Yes        Copied from CRM #[id number]. Topic: Clinical - Red Word Triage >> Jul 10, 2024  8:06 AM Lindsey Rodriguez wrote: Red Word that prompted transfer to Nurse Triage: Patient was seen on 01/05 for Sinus and chest congestion, coughing up green mucus, bad cough, fever. Patient states since being seen her cough has gotten with discharging more discolored mucous. Reason for Disposition  Wheezing is present  Answer Assessment - Initial Assessment Questions Patient called in to triage with complaints of persistent productive cough  This has been ongoing for several weeks, since 06/19/24.  The patient stated she was seen in office on 15/26. She was advised to return to office if symptoms persist. She must sit up to sleep before coughing spell returns.  For home care, the patient is taking  Flonase  nasal spray, and Doxycycline .   Appointment scheduled for further evaluation; Patient agrees with the plan of care, and will reach out if symptoms worsen or persist.          1. ONSET: When did the cough begin?      06/19/24   2. SEVERITY: How bad is the cough today?      Constant   3. SPUTUM: Describe the color of your sputum (e.g., none, dry cough; clear, white, yellow, green)     Green- white   4. HEMOPTYSIS: Are you coughing up any blood? If Yes, ask: How much? (e.g., flecks, streaks, tablespoons, etc.)     No   5. DIFFICULTY BREATHING: Are you having difficulty breathing? If Yes, ask: How bad is it? (e.g., mild,  moderate, severe)      No   6. FEVER: Do you have a fever? If Yes, ask: What is your temperature, how was it measured, and when did it start?     Low grade   7. CARDIAC HISTORY: Do you have any history of heart disease? (e.g., heart attack, congestive heart failure)      No   8. LUNG HISTORY: Do you have any history of lung disease?  (e.g., pulmonary embolus, asthma, emphysema)     No   10. OTHER SYMPTOMS: Do you have any other symptoms? (e.g., runny nose, wheezing, chest pain)        Chest pain related to constant cough, wheezing noted.  Protocols used: Cough - Acute Productive-A-AH

## 2024-07-18 ENCOUNTER — Telehealth: Payer: Self-pay | Admitting: Hematology

## 2024-07-18 ENCOUNTER — Encounter: Payer: Self-pay | Admitting: Family Medicine

## 2024-07-18 NOTE — Telephone Encounter (Signed)
 Scheduled patient for next appointments. Called and spoke with the patient, she is aware.

## 2024-07-21 ENCOUNTER — Encounter: Payer: Self-pay | Admitting: Family Medicine

## 2024-07-21 ENCOUNTER — Ambulatory Visit: Admitting: Family Medicine

## 2024-07-21 VITALS — BP 120/74 | HR 70 | Temp 98.9°F | Ht 66.0 in | Wt 200.4 lb

## 2024-07-21 DIAGNOSIS — H6692 Otitis media, unspecified, left ear: Secondary | ICD-10-CM | POA: Diagnosis not present

## 2024-07-21 DIAGNOSIS — J019 Acute sinusitis, unspecified: Secondary | ICD-10-CM

## 2024-07-21 DIAGNOSIS — R519 Headache, unspecified: Secondary | ICD-10-CM

## 2024-07-21 DIAGNOSIS — G8929 Other chronic pain: Secondary | ICD-10-CM

## 2024-07-21 MED ORDER — AZITHROMYCIN 250 MG PO TABS: ORAL_TABLET | ORAL | 0 refills | Status: AC

## 2024-07-21 MED ORDER — PREDNISONE 10 MG (21) PO TBPK: ORAL_TABLET | ORAL | 0 refills | Status: AC

## 2024-07-21 NOTE — Patient Instructions (Signed)
 Stay well hydrated. Continue the mucinex and sinus rinses. Take the z-pak as directed.  You only take this for 5 days, but it works for 10.  If you are improving, but not 100% better by day 10, contact us  and we will send in a refill (to be started on day 11). If you are not any better by 5-7 days, then we need to change the antibiotics. Take the prednisone  taper as directed (with food). Avoid anti-inflammatories while taking prednisone . Use tylenol  as needed for pain or fever.

## 2024-07-21 NOTE — Addendum Note (Signed)
 Addended by: VIVIAN COUNTRYMAN F on: 07/21/2024 12:52 PM   Modules accepted: Orders

## 2024-07-21 NOTE — Progress Notes (Signed)
 Chief Complaint  Patient presents with   Cough    Cough and sinus pressure that hasn't gone away since Dec. Mucus is green and curdled. Has been seen several times between UC, Dr Vita and Dr. Randol. Ears are full, sound is muffled-lots of pressure. HA is worse. Hycodan that was given made her dizzy and fuzzy-head spinning.   She was last seen by me on 07/10/2024.  She had been sick since mid-December, already treated with cefdinir and 5d of prednisone  without improvement.  She had seen Dr. Vita 1/5 and was prescribed 7d of doxycycline  and had a normal CXR.  The phlegm was improving in color (more white, less green) and less productive. She was advised to complete the course of doxycycline . She was prescribed AirSupra  (to replace albuterol ), discussed use of spacer (and need to rinse with this new inhaler), and hycodan syrup, and was to continue use of guaifenesin and sinus rinses.  Today she reports she has fewer episodes of uncontrolled coughing. But when she starts coughing, it can last for 10 minutes. She completed the course of doxycycline , it never went away. She feels worse since she stopped it.  The sinuses feel worse. Over the weekend her left ear was very painful--felt full, like it could explode. She continues to have L ear pain.  The right ear hurts some, but isn't as congested as the left side. She wasn't able to hear anything out of her L ear last night.  She still has facial pressure and pain.  All of the sinuses hurt. She has had a headache for a month--across all of her sinuses, and the top of her head, and radiates down to her ears, and also posterior head. Nasal drainage and phlegm has been yellow, green, sometimes white curds.  She finds [employer] helps, using it 3x/day.  It helps her breathe easier, loosens up the chest.  She has had LG fevers (99-100) over the weekend.  Hycodan made her feel dizzy/fuzzy, only took it twice.      PMH, PSH, SH reviewed  Outpatient  Encounter Medications as of 07/21/2024  Medication Sig Note   Albuterol -Budesonide (AIRSUPRA ) 90-80 MCG/ACT AERO Inhale 2 Inhalations into the lungs every 4 (four) hours as needed (wheezing or shortness of breath). 07/21/2024: Using every 4-6 hours   buPROPion  (WELLBUTRIN  XL) 150 MG 24 hr tablet Take by mouth.    carvedilol  (COREG ) 3.125 MG tablet TAKE 1 TABLET BY MOUTH TWICE DAILY WITH A MEAL    dextromethorphan-guaiFENesin (MUCINEX DM) 30-600 MG 12hr tablet Take 1 tablet by mouth 2 (two) times daily. 07/21/2024: Took this am   empagliflozin  (JARDIANCE ) 10 MG TABS tablet TAKE 1 TABLET BY MOUTH DAILY BEFORE BREAKFAST    EPINEPHrine  0.3 mg/0.3 mL IJ SOAJ injection Inject 0.3 mg into the muscle as needed for anaphylaxis.    FLUoxetine  (PROZAC ) 40 MG capsule Take 2 capsules by mouth daily.    fluticasone  (FLONASE ) 50 MCG/ACT nasal spray Place 2 sprays into both nostrils daily.    Krill Oil 500 MG CAPS Take 500 mg by mouth in the morning and at bedtime.    loratadine  (CLARITIN ) 10 MG tablet Take 10 mg by mouth daily.    LORazepam  (ATIVAN ) 0.5 MG tablet Take 1-2 tablets (0.5-1 mg total) by mouth 3 (three) times daily as needed for anxiety. Prn anxiety 07/21/2024: As needed, once a month   Multiple Vitamin (MULTI-VITAMIN PO) Take 1 tablet by mouth daily. 07/21/2024: [employer] MVI  NON FORMULARY Take 1 tablet by mouth in the morning and at bedtime. 07/21/2024: Calcium , D3, Mg, K1 and K2    sacubitril -valsartan  (ENTRESTO ) 49-51 MG Take 1 tablet by mouth 2 (two) times daily.    Spacer/Aero-Holding Chambers (AEROCHAMBER MV) inhaler Use as instructed    tiZANidine  (ZANAFLEX ) 2 MG tablet Take 1 tablet (2 mg total) by mouth every 6 (six) hours as needed for muscle spasms. 07/21/2024: As needed, maybe once a month    [DISCONTINUED] HYDROcodone  bit-homatropine (HYCODAN) 5-1.5 MG/5ML syrup Take 5 mLs by mouth every 8 (eight) hours as needed for cough.    No facility-administered encounter  medications on file as of 07/21/2024.   Allergies  Allergen Reactions   Penicillins Shortness Of Breath and Rash    Has patient had a PCN reaction causing immediate rash, facial/tongue/throat swelling, SOB or lightheadedness with hypotension: no Has patient had a PCN reaction causing severe rash involving mucus membranes or skin necrosis: no Has patient had a PCN reaction that required hospitalization no Has patient had a PCN reaction occurring within the last 10 years: no If all of the above answers are NO, then may proceed with Cephalosporin use.  Other reaction(s): Unknown   Codeine Other (See Comments)    Manic depressive Other reaction(s): Unknown   Voltaren  [Diclofenac ] Nausea And Vomiting and Other (See Comments)    Pt stated tore stomach up   Zetia  [Ezetimibe ] Nausea Only   Crestor  [Rosuvastatin ] Other (See Comments)    Myalgia   Diclofenac  Sodium    Pravastatin  Nausea Only   Vytorin  [Ezetimibe -Simvastatin ] Other (See Comments)    Severe myalgia   Erythromycin Rash    Other reaction(s): Unknown  Has tolerated z-pak in past.   ROS: URI symptoms per HPI, including headaches, and LG fever. Denies nausea (only if she swallows the phlegm). No vomiting (other than post-tussive emesis twice) or diarrhea. No rashes.   PHYSICAL EXAM:  BP 120/74   Pulse 70   Temp 98.9 F (37.2 C) (Tympanic)   Ht 5' 6 (1.676 m)   Wt 200 lb 6.4 oz (90.9 kg)   LMP 12/16/2012   SpO2 97%   BMI 32.35 kg/m   Wt Readings from Last 3 Encounters:  07/21/24 200 lb 6.4 oz (90.9 kg)  07/10/24 199 lb 9.6 oz (90.5 kg)  07/07/24 197 lb 3.2 oz (89.4 kg)   Tired-appearing female, with some sniffling, occasional deep cough during visit (short coughing spell--much better than at last visit). Sounds very nasal/congested. Speaking easily and comfortably. HEENT: conjunctiva and sclera are clear, EOMI. Nasal mucosa is mildly edematous with some erythema bilaterally, and yellow crusting noted on the  right. OP is clear R TM with mild effusion, yellow L had significant erythema superiorly, + effusion Diffusely tender over sinuses x 4. Also tender at  L temporalis muscle, L TMJ and L SCM muscle (just below mastoid). Neck: no lymphadenopathy or mass.  Heart: regular rate and rhythm Lungs: trace ronchi initially at L upper lung, which cleared after cough. No wheezes, rales, good air movement. Psych: normal mood, affect, hygiene and grooming Neuro: alert and oriented, cranial nerves grossly intact, normal gait.    ASSESSMENT/PLAN:  Acute sinusitis, recurrence not specified, unspecified location - cont mucinex, sinus rinses.  To contact us  to change ABX if sx not improving, or for refill if persistent sx at day 10 - Plan: azithromycin  (ZITHROMAX ) 250 MG tablet, predniSONE  (STERAPRED UNI-PAK 21 TAB) 10 MG (21) TBPK tablet  Chronic intractable headache, unspecified headache  type - muscular component (SCM, temporalis), and sinus HA.  Prednisone  should help. Risks/SE of steroids reviewed - Plan: predniSONE  (STERAPRED UNI-PAK 21 TAB) 10 MG (21) TBPK tablet  Acute otitis media, left - Plan: azithromycin  (ZITHROMAX ) 250 MG tablet  Stay well hydrated. Continue the mucinex and sinus rinses. Take the z-pak as directed.  You only take this for 5 days, but it works for 10.  If you are improving, but not 100% better by day 10, contact us  and we will send in a refill (to be started on day 11). If you are not any better by 5-7 days, then we need to change the antibiotics. Take the prednisone  taper as directed (with food). Avoid anti-inflammatories while taking prednisone . Use tylenol  as needed for pain or fever.

## 2024-07-24 ENCOUNTER — Encounter: Payer: Self-pay | Admitting: Family Medicine

## 2024-07-31 ENCOUNTER — Other Ambulatory Visit: Payer: Self-pay | Admitting: Family Medicine

## 2024-07-31 ENCOUNTER — Ambulatory Visit: Payer: Self-pay | Admitting: Family Medicine

## 2024-07-31 MED ORDER — CEFDINIR 300 MG PO CAPS: 300.0000 mg | ORAL_CAPSULE | Freq: Two times a day (BID) | ORAL | 0 refills | Status: AC

## 2024-07-31 NOTE — Telephone Encounter (Signed)
 Zpak was prescribed 10d ago.  Clearly she is not better. She has been on MULTIPLE antibiotics over the last 6 weeks (at least 3 that I know of). Since she has persistent ear pain and fever, I will go ahead and send in cefdinir  BID x 1 week. She should continue other supportive measures to treat sore throat as we previously discussed. She will need a f/u appt in the office for any ongoing issues.  Please advise (Rx sent separately since this was initiated as a CRM and it won't let me)

## 2024-07-31 NOTE — Telephone Encounter (Signed)
 Patient advised

## 2024-07-31 NOTE — Telephone Encounter (Signed)
 I called patient and she states she is not getting much better since last visit. Cough has improved with use of Delsym. She still has lots of sinus drainage and congestion which is causing her throat to hurt. She has right ear pain. Low grade temp, 99. I did offer appt but she said the roads are just too icy out by her and asked if you would please take that into consideration.

## 2024-07-31 NOTE — Telephone Encounter (Signed)
 FYI Only or Action Required?: Action required by provider: clinical question for provider and medication request.  Patient was last seen in primary care on 07/21/2024 by Randol Dawes, MD.  Called Nurse Triage reporting Sore Throat.  Symptoms began about a month ago.  Interventions attempted: OTC medications: Benadryl, mucinex, Delsym.  Symptoms are: gradually worsening.  Triage Disposition: See Physician Within 24 Hours  Patient/caregiver understands and will follow disposition?: No, wishes to speak with PCP  Message from [place] D sent at 07/31/2024 10:03 AM EST  Reason for Triage: sore throat, sinus drainage, feels like ice pick in the R ear and is extremely painful   Reason for Disposition  Earache also present  Answer Assessment - Initial Assessment Questions 1. ONSET: When did the throat start hurting? (Hours or days ago)      X 1.5 months  2. SEVERITY: How bad is the sore throat? (Scale 1-10; mild, moderate or severe)     Worse since starting  3. STREP EXPOSURE: Has there been any exposure to strep within the past week? If Yes, ask: What type of contact occurred?      Denies  4.  VIRAL SYMPTOMS: Are there any symptoms of a cold, such as a runny nose, cough, hoarse voice or red eyes?      Hoarseness  5. FEVER: Do you have a fever? If Yes, ask: What is your temperature, how was it measured, and when did it start?     Low grade  6. PUS ON THE TONSILS: Is there pus on the tonsils in the back of your throat?     denies  7. OTHER SYMPTOMS: Pt denies difficulty breathing, rash   Reports headache, R earache (6/10), sinus drainage  Pt reports sore throat, sinus drainage, R earache Pt is taking OTC delsym, benadryl, mucinex Pt states she finished Z pack and prednisone  taper Pt requesting an alternative due to symptoms remaining after regimen Pt agrees with plan of care, will call back for any worsening symptoms  Protocols used: Sore Throat-A-AH

## 2024-08-06 ENCOUNTER — Ambulatory Visit (HOSPITAL_COMMUNITY)
Admission: RE | Admit: 2024-08-06 | Discharge: 2024-08-06 | Disposition: A | Source: Ambulatory Visit | Attending: Cardiovascular Disease | Admitting: Cardiovascular Disease

## 2024-08-06 ENCOUNTER — Other Ambulatory Visit (HOSPITAL_COMMUNITY): Payer: Self-pay | Admitting: Cardiology

## 2024-08-06 DIAGNOSIS — I428 Other cardiomyopathies: Secondary | ICD-10-CM

## 2024-08-06 LAB — ECHOCARDIOGRAM COMPLETE
Area-P 1/2: 3.17 cm2
S' Lateral: 4.1 cm

## 2024-08-07 ENCOUNTER — Ambulatory Visit: Payer: Self-pay | Admitting: Cardiology

## 2024-08-07 NOTE — Progress Notes (Signed)
 No change from 05/06/24 and 10/25/23 with moderate decrease in LV function. Discuss on your visit soon

## 2024-08-15 ENCOUNTER — Ambulatory Visit: Admitting: Cardiology

## 2024-09-08 ENCOUNTER — Encounter

## 2024-09-08 DIAGNOSIS — Z1231 Encounter for screening mammogram for malignant neoplasm of breast: Secondary | ICD-10-CM

## 2024-11-03 ENCOUNTER — Inpatient Hospital Stay

## 2024-11-10 ENCOUNTER — Inpatient Hospital Stay: Admitting: Hematology

## 2024-11-20 ENCOUNTER — Ambulatory Visit: Admitting: Neurology

## 2025-06-29 ENCOUNTER — Encounter: Admitting: Family Medicine
